# Patient Record
Sex: Female | Born: 1935 | Race: White | Hispanic: No | Marital: Married | Smoking: Never smoker
Health system: Southern US, Community
[De-identification: ages and names within clinical notes are randomized; demographics above are authoritative.]

## PROBLEM LIST (undated history)

## (undated) DIAGNOSIS — K224 Dyskinesia of esophagus: Secondary | ICD-10-CM

## (undated) DIAGNOSIS — E785 Hyperlipidemia, unspecified: Secondary | ICD-10-CM

## (undated) DIAGNOSIS — M545 Low back pain, unspecified: Secondary | ICD-10-CM

## (undated) DIAGNOSIS — I739 Peripheral vascular disease, unspecified: Secondary | ICD-10-CM

## (undated) DIAGNOSIS — A4902 Methicillin resistant Staphylococcus aureus infection, unspecified site: Secondary | ICD-10-CM

## (undated) DIAGNOSIS — N840 Polyp of corpus uteri: Secondary | ICD-10-CM

## (undated) DIAGNOSIS — M4800 Spinal stenosis, site unspecified: Secondary | ICD-10-CM

## (undated) DIAGNOSIS — R590 Localized enlarged lymph nodes: Secondary | ICD-10-CM

## (undated) DIAGNOSIS — R1310 Dysphagia, unspecified: Secondary | ICD-10-CM

## (undated) DIAGNOSIS — R0600 Dyspnea, unspecified: Secondary | ICD-10-CM

## (undated) DIAGNOSIS — I4891 Unspecified atrial fibrillation: Secondary | ICD-10-CM

## (undated) DIAGNOSIS — Z8719 Personal history of other diseases of the digestive system: Secondary | ICD-10-CM

## (undated) DIAGNOSIS — I10 Essential (primary) hypertension: Secondary | ICD-10-CM

## (undated) DIAGNOSIS — R351 Nocturia: Secondary | ICD-10-CM

## (undated) DIAGNOSIS — M51369 Other intervertebral disc degeneration, lumbar region without mention of lumbar back pain or lower extremity pain: Secondary | ICD-10-CM

## (undated) DIAGNOSIS — N83209 Unspecified ovarian cyst, unspecified side: Secondary | ICD-10-CM

## (undated) DIAGNOSIS — I639 Cerebral infarction, unspecified: Secondary | ICD-10-CM

## (undated) DIAGNOSIS — D649 Anemia, unspecified: Secondary | ICD-10-CM

## (undated) DIAGNOSIS — M1990 Unspecified osteoarthritis, unspecified site: Secondary | ICD-10-CM

## (undated) DIAGNOSIS — R2681 Unsteadiness on feet: Secondary | ICD-10-CM

## (undated) DIAGNOSIS — M810 Age-related osteoporosis without current pathological fracture: Secondary | ICD-10-CM

## (undated) DIAGNOSIS — L0390 Cellulitis, unspecified: Secondary | ICD-10-CM

## (undated) DIAGNOSIS — G629 Polyneuropathy, unspecified: Secondary | ICD-10-CM

## (undated) DIAGNOSIS — J90 Pleural effusion, not elsewhere classified: Secondary | ICD-10-CM

## (undated) DIAGNOSIS — E119 Type 2 diabetes mellitus without complications: Secondary | ICD-10-CM

## (undated) DIAGNOSIS — L97514 Non-pressure chronic ulcer of other part of right foot with necrosis of bone: Secondary | ICD-10-CM

## (undated) DIAGNOSIS — K219 Gastro-esophageal reflux disease without esophagitis: Secondary | ICD-10-CM

## (undated) DIAGNOSIS — T148XXA Other injury of unspecified body region, initial encounter: Secondary | ICD-10-CM

## (undated) DIAGNOSIS — S32050A Wedge compression fracture of fifth lumbar vertebra, initial encounter for closed fracture: Secondary | ICD-10-CM

## (undated) DIAGNOSIS — R32 Unspecified urinary incontinence: Secondary | ICD-10-CM

## (undated) DIAGNOSIS — R0609 Other forms of dyspnea: Secondary | ICD-10-CM

## (undated) DIAGNOSIS — I8390 Asymptomatic varicose veins of unspecified lower extremity: Secondary | ICD-10-CM

## (undated) DIAGNOSIS — M8610 Other acute osteomyelitis, unspecified site: Secondary | ICD-10-CM

## (undated) DIAGNOSIS — M5136 Other intervertebral disc degeneration, lumbar region: Secondary | ICD-10-CM

## (undated) DIAGNOSIS — M069 Rheumatoid arthritis, unspecified: Secondary | ICD-10-CM

## (undated) DIAGNOSIS — R17 Unspecified jaundice: Secondary | ICD-10-CM

## (undated) DIAGNOSIS — I499 Cardiac arrhythmia, unspecified: Secondary | ICD-10-CM

## (undated) DIAGNOSIS — G5603 Carpal tunnel syndrome, bilateral upper limbs: Secondary | ICD-10-CM

## (undated) HISTORY — PX: TONSILLECTOMY: SUR1361

## (undated) HISTORY — PX: COLONOSCOPY: SHX174

## (undated) HISTORY — PX: TOTAL KNEE ARTHROPLASTY: SHX125

## (undated) HISTORY — PX: EYE SURGERY: SHX253

## (undated) HISTORY — PX: APPENDECTOMY: SHX54

## (undated) HISTORY — PX: NECK MASS EXCISION: SHX2079

---
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
## 1993-11-18 HISTORY — PX: KNEE ARTHROSCOPY: SUR90

## 1998-09-28 ENCOUNTER — Other Ambulatory Visit: Admission: RE | Admit: 1998-09-28 | Discharge: 1998-09-28 | Payer: Self-pay | Admitting: Internal Medicine

## 1999-10-03 ENCOUNTER — Other Ambulatory Visit: Admission: RE | Admit: 1999-10-03 | Discharge: 1999-10-03 | Payer: Self-pay | Admitting: Internal Medicine

## 2000-10-06 ENCOUNTER — Other Ambulatory Visit: Admission: RE | Admit: 2000-10-06 | Discharge: 2000-10-06 | Payer: Self-pay | Admitting: Internal Medicine

## 2003-04-05 ENCOUNTER — Ambulatory Visit (HOSPITAL_COMMUNITY): Admission: RE | Admit: 2003-04-05 | Discharge: 2003-04-05 | Payer: Self-pay | Admitting: Gastroenterology

## 2003-04-05 ENCOUNTER — Encounter (INDEPENDENT_AMBULATORY_CARE_PROVIDER_SITE_OTHER): Payer: Self-pay | Admitting: *Deleted

## 2004-11-18 HISTORY — PX: TOTAL HIP ARTHROPLASTY: SHX124

## 2005-07-09 ENCOUNTER — Emergency Department (HOSPITAL_COMMUNITY): Admission: EM | Admit: 2005-07-09 | Discharge: 2005-07-09 | Payer: Self-pay | Admitting: Emergency Medicine

## 2005-07-19 ENCOUNTER — Ambulatory Visit (HOSPITAL_COMMUNITY): Admission: RE | Admit: 2005-07-19 | Discharge: 2005-07-19 | Payer: Self-pay | Admitting: Obstetrics and Gynecology

## 2005-08-08 ENCOUNTER — Inpatient Hospital Stay (HOSPITAL_COMMUNITY): Admission: EM | Admit: 2005-08-08 | Discharge: 2005-08-13 | Payer: Self-pay | Admitting: Specialist

## 2005-08-16 ENCOUNTER — Emergency Department (HOSPITAL_COMMUNITY): Admission: EM | Admit: 2005-08-16 | Discharge: 2005-08-16 | Payer: Self-pay | Admitting: *Deleted

## 2005-12-06 ENCOUNTER — Ambulatory Visit (HOSPITAL_COMMUNITY): Admission: RE | Admit: 2005-12-06 | Discharge: 2005-12-06 | Payer: Self-pay | Admitting: Obstetrics and Gynecology

## 2007-10-13 ENCOUNTER — Emergency Department (HOSPITAL_COMMUNITY): Admission: EM | Admit: 2007-10-13 | Discharge: 2007-10-13 | Payer: Self-pay | Admitting: Emergency Medicine

## 2007-11-19 HISTORY — PX: TOTAL KNEE ARTHROPLASTY: SHX125

## 2008-01-18 ENCOUNTER — Inpatient Hospital Stay (HOSPITAL_COMMUNITY): Admission: RE | Admit: 2008-01-18 | Discharge: 2008-01-21 | Payer: Self-pay | Admitting: Orthopedic Surgery

## 2009-01-24 ENCOUNTER — Inpatient Hospital Stay (HOSPITAL_COMMUNITY): Admission: RE | Admit: 2009-01-24 | Discharge: 2009-01-26 | Payer: Self-pay | Admitting: Orthopedic Surgery

## 2009-09-24 ENCOUNTER — Encounter: Admission: RE | Admit: 2009-09-24 | Discharge: 2009-09-24 | Payer: Self-pay | Admitting: Neurology

## 2009-10-31 ENCOUNTER — Encounter: Admission: RE | Admit: 2009-10-31 | Discharge: 2009-11-15 | Payer: Self-pay | Admitting: Neurology

## 2009-11-18 HISTORY — PX: INCISION AND DRAINAGE: SHX5863

## 2009-11-27 ENCOUNTER — Encounter: Admission: RE | Admit: 2009-11-27 | Discharge: 2009-12-29 | Payer: Self-pay | Admitting: Neurology

## 2010-02-20 ENCOUNTER — Inpatient Hospital Stay (HOSPITAL_COMMUNITY): Admission: RE | Admit: 2010-02-20 | Discharge: 2010-02-23 | Payer: Self-pay | Admitting: Orthopedic Surgery

## 2010-02-22 ENCOUNTER — Ambulatory Visit: Payer: Self-pay | Admitting: Infectious Disease

## 2010-02-22 DIAGNOSIS — T8489XA Other specified complication of internal orthopedic prosthetic devices, implants and grafts, initial encounter: Secondary | ICD-10-CM

## 2010-03-10 ENCOUNTER — Emergency Department (HOSPITAL_COMMUNITY)
Admission: EM | Admit: 2010-03-10 | Discharge: 2010-03-10 | Payer: Self-pay | Source: Home / Self Care | Admitting: Emergency Medicine

## 2010-03-13 ENCOUNTER — Telehealth: Payer: Self-pay | Admitting: Infectious Disease

## 2010-03-14 ENCOUNTER — Telehealth: Payer: Self-pay | Admitting: Infectious Disease

## 2010-03-14 DIAGNOSIS — M009 Pyogenic arthritis, unspecified: Secondary | ICD-10-CM | POA: Insufficient documentation

## 2010-03-20 ENCOUNTER — Encounter: Payer: Self-pay | Admitting: Infectious Disease

## 2010-03-20 ENCOUNTER — Encounter (INDEPENDENT_AMBULATORY_CARE_PROVIDER_SITE_OTHER): Payer: Self-pay | Admitting: *Deleted

## 2010-03-20 ENCOUNTER — Emergency Department (HOSPITAL_COMMUNITY): Admission: EM | Admit: 2010-03-20 | Discharge: 2010-03-20 | Payer: Self-pay | Admitting: Emergency Medicine

## 2010-03-20 ENCOUNTER — Telehealth: Payer: Self-pay | Admitting: Infectious Disease

## 2010-03-20 DIAGNOSIS — M1990 Unspecified osteoarthritis, unspecified site: Secondary | ICD-10-CM | POA: Insufficient documentation

## 2010-03-20 DIAGNOSIS — L089 Local infection of the skin and subcutaneous tissue, unspecified: Secondary | ICD-10-CM | POA: Insufficient documentation

## 2010-03-20 DIAGNOSIS — K219 Gastro-esophageal reflux disease without esophagitis: Secondary | ICD-10-CM | POA: Insufficient documentation

## 2010-03-26 ENCOUNTER — Ambulatory Visit: Payer: Self-pay | Admitting: Infectious Disease

## 2010-03-26 DIAGNOSIS — R197 Diarrhea, unspecified: Secondary | ICD-10-CM | POA: Insufficient documentation

## 2010-03-26 DIAGNOSIS — I4891 Unspecified atrial fibrillation: Secondary | ICD-10-CM | POA: Insufficient documentation

## 2010-03-26 DIAGNOSIS — I10 Essential (primary) hypertension: Secondary | ICD-10-CM

## 2010-03-26 DIAGNOSIS — L270 Generalized skin eruption due to drugs and medicaments taken internally: Secondary | ICD-10-CM | POA: Insufficient documentation

## 2010-03-26 LAB — CONVERTED CEMR LAB: CRP: 0.3 mg/dL (ref <0.6)

## 2010-03-28 ENCOUNTER — Telehealth: Payer: Self-pay

## 2010-04-19 ENCOUNTER — Telehealth: Payer: Self-pay | Admitting: Infectious Disease

## 2010-04-26 ENCOUNTER — Ambulatory Visit: Payer: Self-pay | Admitting: Infectious Disease

## 2010-05-04 ENCOUNTER — Encounter: Payer: Self-pay | Admitting: Infectious Disease

## 2010-06-06 ENCOUNTER — Ambulatory Visit: Payer: Self-pay | Admitting: Infectious Disease

## 2010-06-06 DIAGNOSIS — L97509 Non-pressure chronic ulcer of other part of unspecified foot with unspecified severity: Secondary | ICD-10-CM

## 2010-06-20 LAB — CONVERTED CEMR LAB
ALT: 13 units/L (ref 0–35)
Albumin: 4.1 g/dL (ref 3.5–5.2)
Alkaline Phosphatase: 69 units/L (ref 39–117)
CO2: 26 meq/L (ref 19–32)
CRP: 0.8 mg/dL — ABNORMAL HIGH (ref <0.6)
Glucose, Bld: 102 mg/dL — ABNORMAL HIGH (ref 70–99)
MCHC: 30.8 g/dL (ref 30.0–36.0)
MCV: 87.8 fL (ref 78.0–100.0)
Platelets: 304 10*3/uL (ref 150–400)
Potassium: 4.6 meq/L (ref 3.5–5.3)
RBC: 4.74 M/uL (ref 3.87–5.11)
Sed Rate: 21 mm/hr (ref 0–22)
Sodium: 141 meq/L (ref 135–145)
Total Protein: 7.2 g/dL (ref 6.0–8.3)

## 2010-07-10 ENCOUNTER — Emergency Department (HOSPITAL_COMMUNITY): Admission: EM | Admit: 2010-07-10 | Discharge: 2010-07-10 | Payer: Self-pay | Admitting: Emergency Medicine

## 2010-08-20 ENCOUNTER — Encounter: Admission: RE | Admit: 2010-08-20 | Discharge: 2010-09-19 | Payer: Self-pay | Admitting: Orthopedic Surgery

## 2010-09-04 ENCOUNTER — Ambulatory Visit: Payer: Self-pay | Admitting: Infectious Disease

## 2010-09-04 LAB — CONVERTED CEMR LAB
BUN: 18 mg/dL (ref 6–23)
Basophils Absolute: 0 10*3/uL (ref 0.0–0.1)
Basophils Relative: 0 % (ref 0–1)
CRP: 0.4 mg/dL (ref <0.6)
Chloride: 106 meq/L (ref 96–112)
Hemoglobin: 13.7 g/dL (ref 12.0–15.0)
Lymphocytes Relative: 15 % (ref 12–46)
MCHC: 32.9 g/dL (ref 30.0–36.0)
Monocytes Absolute: 0.5 10*3/uL (ref 0.1–1.0)
Neutro Abs: 6.7 10*3/uL (ref 1.7–7.7)
Neutrophils Relative %: 77 % (ref 43–77)
Platelets: 277 10*3/uL (ref 150–400)
Potassium: 4.7 meq/L (ref 3.5–5.3)
RDW: 15.9 % — ABNORMAL HIGH (ref 11.5–15.5)
Sed Rate: 1 mm/hr (ref 0–22)
Sodium: 143 meq/L (ref 135–145)

## 2010-09-13 ENCOUNTER — Ambulatory Visit (HOSPITAL_COMMUNITY): Admission: RE | Admit: 2010-09-13 | Discharge: 2010-09-13 | Payer: Self-pay | Admitting: Infectious Disease

## 2010-10-19 ENCOUNTER — Inpatient Hospital Stay (HOSPITAL_COMMUNITY)
Admission: RE | Admit: 2010-10-19 | Discharge: 2010-10-24 | Payer: Self-pay | Source: Home / Self Care | Attending: Orthopedic Surgery | Admitting: Orthopedic Surgery

## 2010-12-03 ENCOUNTER — Ambulatory Visit
Admission: RE | Admit: 2010-12-03 | Discharge: 2010-12-03 | Payer: Self-pay | Source: Home / Self Care | Attending: Infectious Disease | Admitting: Infectious Disease

## 2010-12-03 ENCOUNTER — Encounter: Payer: Self-pay | Admitting: Infectious Disease

## 2010-12-03 LAB — CONVERTED CEMR LAB
BUN: 20 mg/dL (ref 6–23)
Basophils Absolute: 0 10*3/uL (ref 0.0–0.1)
CO2: 28 meq/L (ref 19–32)
CRP: 0.4 mg/dL (ref <0.6)
Calcium: 9.3 mg/dL (ref 8.4–10.5)
Creatinine, Ser: 0.76 mg/dL (ref 0.40–1.20)
Eosinophils Absolute: 0.3 10*3/uL (ref 0.0–0.7)
Eosinophils Relative: 3 % (ref 0–5)
Glucose, Bld: 131 mg/dL — ABNORMAL HIGH (ref 70–99)
HCT: 39.2 % (ref 36.0–46.0)
Lymphocytes Relative: 13 % (ref 12–46)
Lymphs Abs: 1.1 10*3/uL (ref 0.7–4.0)
Neutrophils Relative %: 78 % — ABNORMAL HIGH (ref 43–77)
Platelets: 280 10*3/uL (ref 150–400)
RDW: 14.1 % (ref 11.5–15.5)
Sed Rate: 5 mm/hr (ref 0–22)
WBC: 8.4 10*3/uL (ref 4.0–10.5)

## 2010-12-18 LAB — PROTIME-INR
INR: 4.76 — ABNORMAL HIGH (ref 0.00–1.49)
Prothrombin Time: 44.5 seconds — ABNORMAL HIGH (ref 11.6–15.2)

## 2010-12-18 LAB — URINALYSIS, ROUTINE W REFLEX MICROSCOPIC
Bilirubin Urine: NEGATIVE
Nitrite: NEGATIVE
Specific Gravity, Urine: 1.018 (ref 1.005–1.030)
Urine Glucose, Fasting: NEGATIVE mg/dL
pH: 7 (ref 5.0–8.0)

## 2010-12-18 LAB — BASIC METABOLIC PANEL
Chloride: 102 mEq/L (ref 96–112)
GFR calc non Af Amer: >60 mL/min (ref >60)
Potassium: 4.3 mEq/L (ref 3.5–5.1)
Sodium: 143 mEq/L (ref 135–145)

## 2010-12-18 LAB — CBC
Hemoglobin: 13.7 g/dL (ref 12.0–15.0)
MCH: 28.8 pg (ref 26.0–34.0)
MCV: 88.2 fL (ref 78.0–100.0)
Platelets: 275 10*3/uL (ref 150–400)
RBC: 4.76 MIL/uL (ref 3.87–5.11)
WBC: 8.2 10*3/uL (ref 4.0–10.5)

## 2010-12-18 LAB — APTT: aPTT: 56 seconds — ABNORMAL HIGH (ref 24–37)

## 2010-12-18 LAB — DIFFERENTIAL
Eosinophils Absolute: 0.3 10*3/uL (ref 0.0–0.7)
Lymphocytes Relative: 14 % (ref 12–46)
Lymphs Abs: 1.1 10*3/uL (ref 0.7–4.0)
Monocytes Relative: 5 % (ref 3–12)
Neutro Abs: 6.3 10*3/uL (ref 1.7–7.7)
Neutrophils Relative %: 77 % (ref 43–77)

## 2010-12-18 NOTE — Miscellaneous (Signed)
Summary: Problems, Medications and Allergies updated  Clinical Lists Changes  Problems: Added new problem of OSTEOARTHRITIS (ICD-715.90) Added new problem of GERD (ICD-530.81) Added new problem of OTH COMPLICATIONS DUE INTERNAL JOINT PROSTHESIS (ICD-996.77) - infection, septic arthritis Added new problem of UNSPEC LOCAL INFECTION SKIN&SUBCUTANEOUS TISSUE (ICD-686.9) - right second toe Medications: Added new medication of D.O.S. 250 MG CAPS (DOCUSATE SODIUM) as directed per PCP Added new medication of LOVENOX 100 MG/ML SOLN (ENOXAPARIN SODIUM) as directed per PCP Added new medication of IRON 325 (65 FE) MG TABS (FERROUS SULFATE) as directed per PCP Added new medication of VICODIN 5-500 MG TABS (HYDROCODONE-ACETAMINOPHEN) as directed per PCP Added new medication of PROTONIX 40 MG TBEC (PANTOPRAZOLE SODIUM) as directed per PCP Added new medication of MIRALAX  POWD (POLYETHYLENE GLYCOL 3350) as directed per PCP Added new medication of TYLENOL 325 MG TABS (ACETAMINOPHEN) as directed per PCP Added new medication of CALCIUM 500 MG TABS (CALCIUM) as directed per PCP Added new medication of DIPHENHYDRAMINE HCL 25 MG CAPS (DIPHENHYDRAMINE HCL) as directed by PCP Added new medication of DILAUDID 2 MG TABS (HYDROMORPHONE HCL) as directed per PCP Added new medication of MAALOX REGULAR STRENGTH 225-200-25 MG/5ML SUSP (ALUM & MAG HYDROXIDE-SIMETH) as directed per PCP Added new medication of ROBAXIN 500 MG TABS (METHOCARBAMOL) as directed per PCP Added new medication of ZOFRAN 4 MG TABS (ONDANSETRON HCL) as directed per PCP Added new medication of AMBIEN 5 MG TABS (ZOLPIDEM TARTRATE) as directed per PCP Added new medication of VANCOMYCIN HCL IN DEXTROSE 1 GM/200ML SOLN (VANCOMYCIN HCL IN DEXTROSE) per pharmacy protocol Added new medication of ROCEPHIN 1 GM SOLR (CEFTRIAXONE SODIUM) per Dr. [name] Allergies: Added new allergy or adverse reaction of SULFA Observations: Added new observation of ALLERGY  REV: Done (03/20/2010 11:04) Added new observation of NKA: F (03/20/2010 11:04)

## 2010-12-18 NOTE — Progress Notes (Signed)
Summary: HH cert. ends 6/6, last dose abx 6/8, PICC pull @ ID 04/26/10  Phone Note Other Incoming   Caller: Tawanna Cooler from [facility] [phone] Details for Reason: end of certification period for medicare Summary of Call: Certification period ends 04/23/2010 for [facility] RN visits.  [name] states that they cannot obtain recertification for one visit to d/c PICC after last dose of abx on 04/25/2010.  Asking that the pt. come to the [facility] office to have her PICC pulled.  Appt. made for 04/26/2010 @ 1030 w/ RN to have PICC pulled. [name] RN  April 19, 2010 12:19 PM

## 2010-12-18 NOTE — Progress Notes (Signed)
Summary: Allergic rxn post rocephin and vanco infusion  Phone Note Outgoing Call   Call placed by: Acey Lav MD,  Mar 20, 2010 1:49 PM Details for Reason: Pt having allergic rxn to vancomycin vs rocephin. Summary of Call: pt had tingly feeling post rocephin, then one hr later given vancomycin and had diffuse rash with pruritic lesions per ed staff. I am changing her to cubicin alone. Initial call taken by: Acey Lav MD,  Mar 20, 2010 1:50 PM   New Allergies: ! VANCOMYCIN HCL IN DEXTROSE (VANCOMYCIN HCL IN DEXTROSE) ! ROCEPHIN (CEFTRIAXONE SODIUM) New/Updated Medications: CUBICIN 500 MG SOLR (DAPTOMYCIN) 6mg /kg iv daily New Allergies: ! VANCOMYCIN HCL IN DEXTROSE (VANCOMYCIN HCL IN DEXTROSE) ! ROCEPHIN (CEFTRIAXONE SODIUM)

## 2010-12-18 NOTE — Miscellaneous (Signed)
Summary: [name]: Orders  [facility]: Orders   Imported By: Florinda Marker 04/18/2010 14:39:44  _____________________________________________________________________  External Attachment:    Type:   Image     Comment:   External Document

## 2010-12-18 NOTE — Assessment & Plan Note (Signed)
Summary: 87month f/u [mkj]   Visit Type:  Follow-up Referring Provider:  [name] Primary Provider:  [name]  CC:  3 month follow up.  History of Present Illness: 77 year old  lady with left total knee replacement on  January 24, 2009.  She apparently had been doing relatively well but then  developed an area on her right second toe where she developed an  apparent infection with purulent material from it although she denied  being treated with antibiotic.  She was cared for by her podiatrist.  Since then she sustained a fall and landed on her knee where she had  swelling and pain.  The swelling worsened and she developed increasing  erythema along with malaise.  She was seen by [name] in his office on  April , 2011 and he aspirated the knee and the aspirate showed 41,000 white  blood cells with a predominance of neutrophils.  He sent it for cultures and aerobic and anaerobic cultures failed to grow any organisms. She was started on doxycycline and then was admitted on April  5 and underwent incision and drainage of the left total knee including  extensive synovectomy of the anterior suprapatellar, medial and lateral  gutters with polyethylene exchange.  Intraoperative cultures were sent failed to grow any organisms.  I saw her at [facility] and added rocephin to her vancomycin She was experiencing from paresthesias with the infusion of her rocephin and immediately after vancomycin. Shecurrently remains on doxycycline on which [name] had wished that she remiain for minimum of  6 months total. She has been without fevers, chills. She only has pain in knee with prolonged standingf rom PT alone.  Her 2nd toe has developed worsening pain which shoots up her ankle.   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 250 Mg Caps (Docusate Sodium) .... As Directed Per Pcp 2)  Iron 325 (65 Fe) Mg Tabs (Ferrous Sulfate) .... As Directed Per Pcp 3)  Calcium 500 Mg Tabs (Calcium) .... As Directed Per Pcp 4)  Maalox Regular Strength 225-200-25 Mg/86ml Susp (Alum & Mag Hydroxide-Simeth) .... As Directed Per Pcp 5)  Coumadin 1 Mg Tabs (Warfarin Sodium) .... As Directed 6)  Toprol Xl 25 Mg Xr24h-Tab (Metoprolol Succinate) .... Take One Tablet By Mouth One Time A Day 7)  Doxycycline Hyclate 100 Mg Caps (Doxycycline Hyclate) .... Take 1 Tablet By Mouth Two Times A Day Until Instructed By Dr. [name] 8)  Norvasc 5 Mg Tabs (Amlodipine Besylate) .... Take 1 Tablet By Mouth Once A Day  Current Medications (verified): 1)  D.o.s. 250 Mg Caps (Docusate Sodium) .... As Directed Per Pcp 2)  Iron 325 (65 Fe) Mg Tabs (Ferrous Sulfate) .... As Directed Per Pcp 3)  Calcium 500 Mg Tabs (Calcium) .... As Directed Per Pcp 4)  Maalox Regular Strength 225-200-25 Mg/62ml Susp (Alum & Mag Hydroxide-Simeth) .... As Directed Per Pcp 5)  Coumadin 1 Mg Tabs (Warfarin Sodium) .... As Directed 6)  Toprol Xl 25 Mg Xr24h-Tab (Metoprolol Succinate) .... Take One Tablet By Mouth One  Time A Day 7)  Doxycycline Hyclate 100 Mg Caps (Doxycycline Hyclate) .... Take 1 Tablet By Mouth Two Times A Day Until Instructed By Dr. [name] 8)  Norvasc 5 Mg Tabs (Amlodipine Besylate) .... Take 1 Tablet By Mouth Once A Day  Allergies: 1)  ! Sulfa 2)  ! Vancomycin Hcl in Dextrose (Vancomycin Hcl in Dextrose) 3)  ! Rocephin (Ceftriaxone Sodium)   Preventive Screening-Counseling & Management  Alcohol-Tobacco     Alcohol drinks/day: 0     Smoking Status: never  Caffeine-Diet-Exercise     Caffeine  use/day: 0     Does Patient Exercise: yes     Type of exercise: PT  Safety-Violence-Falls     Seat Belt Use: yes   Current Allergies (reviewed today): ! SULFA ! VANCOMYCIN HCL IN DEXTROSE (VANCOMYCIN HCL IN DEXTROSE) ! ROCEPHIN (CEFTRIAXONE SODIUM) Review of Systems  The patient denies anorexia, fever, weight loss, weight gain, vision loss, decreased hearing, hoarseness, chest pain, syncope, dyspnea on exertion, peripheral edema, prolonged cough, headaches, hemoptysis, abdominal pain, melena, hematochezia, severe indigestion/heartburn, hematuria, incontinence, genital sores, muscle weakness, suspicious skin lesions, transient blindness, difficulty walking, depression, unusual weight change, abnormal bleeding, and enlarged lymph nodes.    Vital Signs:  Patient profile:   77 year old female Height:      64 inches (162.56 cm) Weight:      114.8 pounds (52.18 kg) BMI:     19.78 Temp:     97.5 degrees F (36.39 degrees C) oral Pulse rate:   51 / minute BP sitting:   149 / 79  (left arm)  Vitals Entered By: Baxter Hire) (September 04, 2010 9:53 AM) CC: 3 month follow up Pain Assessment Patient in pain? yes     Location: right second toe Intensity: 4 Type: aching Onset of pain  pain comes and goes Nutritional Status BMI of 19 -24 = normal Nutritional Status Detail appetite is good per patient  Does patient need assistance? Functional Status Self care Ambulation Normal   Physical Exam  General:  alert, well-nourished, and well-hydrated.   Head:  normocephalic, atraumatic, and no abnormalities observed.   Eyes:  vision grossly intact, pupils equal, pupils round, and pupils reactive to light.   Ears:  no external deformities.   Nose:  no external deformity.   Mouth:  pharynx pink and moist, no erythema, and no exudates.   Neck:  supple and full ROM.   Lungs:  normal respiratory effort, no crackles, and no wheezes.   Heart:  normal rate, regular rhythm, no murmur, no  gallop, and no rub.   Abdomen:  soft, non-tender, and normal bowel sounds.   Msk:  her left knee site is well healed slightly warm, swollen not especially tender Extremities:  trace left pedal edema and trace right pedal edema.   Neurologic:  alert & oriented X3, strength normal in all extremities, and gait normal.   Skin:  slight ulceration under ventral aspect of 2nd 3rd digits. 2nd digit is tender to palpation and with slight erythema Psych:  Oriented X3, normally interactive, and good eye contact.     Impression & Recommendations:  Problem # 1:  ULCER OF OTHER PART OF FOOT (ICD-707.15) I will get an MRI of this foot to make sure there is no osteomyelitis brewing here. Plain films had been unrevealing in the past. CHeck esr and crp  Her updated medication list for this problem includes:    Doxycycline Hyclate 100 Mg Caps (Doxycycline hyclate) .Marland KitchenMarland KitchenMarland KitchenMarland Kitchen  Take 1 tablet by mouth two times a day until instructed by dr. Zenaida Niece dam  Orders: T-Basic Metabolic Panel [phone]) T-CBC w/Diff [id number]) T-C-Reactive Protein [phone]) T-Sed Rate (Automated) [phone]) MRI with Contrast (MRI w/Contrast) Est. Patient Level IV (75643)  Problem # 2:  ARTHRITIS, SEPTIC (ICD-711.00) Assessment: Unchanged check esr, crp, bmp, cbc today, continue doxycyline for now. She has had 6 months of therapy by now but would like to make sure that her opposite foot is not with infection Her updated medication list for this problem includes:    Doxycycline Hyclate 100 Mg Caps (Doxycycline hyclate) .Marland Kitchen... Take 1 tablet by mouth two times a day until instructed by dr. Zenaida Niece dam  Problem # 3:  DIARRHEA (ICD-787.91) not recurred Her updated medication list for this problem includes:    Doxycycline Hyclate 100 Mg Caps (Doxycycline hyclate) .Marland Kitchen... Take 1 tablet by mouth two times a day until instructed by dr. Zenaida Niece dam  Orders: Est. Patient Level IV (32951)  Patient Instructions: 1)  RTC TO SEE DR. VAN DAM IN ONE  WEEK ON WEDNESDAY AT 3PM

## 2010-12-18 NOTE — Progress Notes (Signed)
Summary: Care Plan Oversight  Phone Note Outgoing Call   Call placed by: Acey Lav MD,  March 14, 2010 11:40 PM Details for Reason: Care Plan Oversight Summary of Call: [id number](> 60 mins) I have supervised home care and/or infusion therapy for this pt, including providing orders for care, review of labs and/or home health care plans, communicating with the home health care professionals and/or patient/caregivers to integrate current information into the medical treatment plan and/or adjust the medical therapy. This supervision has been provided for70_minutes during the calendar month. Dates for this oversight  02/23/10 thru 03/24/10.    Initial call taken by: Acey Lav MD,  March 14, 2010 11:40 PM  New Problems: ARTHRITIS, SEPTIC (ICD-711.00)   New Problems: ARTHRITIS, SEPTIC (ICD-711.00)  Appended Document: Care Plan Oversight Patient being treated for septic arthritis

## 2010-12-18 NOTE — Assessment & Plan Note (Signed)
Summary: [facility] prosthetic jt inf/need chart/   Referring Provider:  [name] Primary Provider:  [name]  CC:  new patient hospital follow up and left knee infection.  History of Present Illness: 77 year old  lady with left total knee replacement on  January 24, 2009.  She apparently had been doing relatively well but then  developed an area on her right second toe where she developed an  apparent infection with purulent material from it although she denied  being treated with antibiotic.  She was cared for by her podiatrist.  Since then she sustained a fall and landed on her knee where she had  swelling and pain.  The swelling worsened and she developed increasing  erythema along with malaise.  She was seen by [name] in his office on  April 1 and he aspirated the knee and the aspirate showed 41,000 white  blood cells with a predominance of neutrophils.  He sent it for cultures and aerobic and anaerobic cultures failed to grow any organisms. The patient apparently was then, according to her account,   started on doxycycline.  She was then admitted to the hospital on April  5 and underwent incision and drainage of the left total knee including  extensive synovectomy of the anterior suprapatellar, medial and lateral  gutters with polyethylene exchange.  Intraoperative cultures were sent   and far failed to grow any organisms.  I saw her at [facility] and added rocephin to her vancomycin Since then she has suffered from a bout of fevers and chills (evaluated in ED), diarrhea (rx by me presumptively for  c difficile colitis though I now doubt this dx) and finally with an allergic rash that developed last week. She was experiencing from paresthesias with the infusion of her rocephin and again experienced this last week. She then took the vancomycin infusion and immediately thereafter developed an intense and diffuse pruritic maculo-papular rash that extended to her trunk arms and legs. She was  seen in the ED and given prednisone. I was called an stopped her vancomycin and rocephin and changed her to daptomycin. Her rash is improving. She continues to have minimal pain in her knee with weight bearing and is without further fevers or chills, malaise, diarrhea. She appears to have been diagnosed with atrial fibrillation in the interim as she now has with her rx for metoprolol and coumadin and speaks of an irregular heartbeat.  Current Medications (verified): 1)  D.o.s. 250 Mg Caps (Docusate Sodium) .... As Directed Per Pcp 2)  Iron 325 (65 Fe) Mg Tabs (Ferrous Sulfate) .... As Directed Per Pcp 3)  Calcium 500 Mg Tabs (Calcium) .... As Directed Per Pcp 4)  Diphenhydramine Hcl 25 Mg Caps (Diphenhydramine Hcl) .... As Directed By Pcp 5)  Maalox Regular Strength 225-200-25 Mg/64ml Susp (Alum & Mag Hydroxide-Simeth) .... As Directed Per Pcp 6)  Zofran 4 Mg Tabs (Ondansetron Hcl) .... As Directed Per Pcp 7)  Cubicin 500 Mg Solr (Daptomycin) .... 6mg /kg Iv Daily 8)  Coumadin 1 Mg Tabs (Warfarin Sodium) .... As Directed 9)  Toprol Xl 25 Mg Xr24h-Tab (Metoprolol Succinate) .... Take One Tablet By Mouth One Time A Day 10)  Prednisone 20 Mg Tabs (Prednisone) .... Take 2 Tablets Daily X 4 Days 11)  Doxycycline Hyclate 100 Mg Caps (Doxycycline Hyclate) .... Take 1 Tablet By Mouth Two Times A Day Until Instructed By Dr. [name]  Allergies (verified): 1)  ! Sulfa 2)  ! Vancomycin Hcl in Dextrose (  Vancomycin Hcl in Dextrose) 3)  ! Rocephin (Ceftriaxone Sodium)   Preventive Screening-Counseling & Management  Alcohol-Tobacco     Alcohol drinks/day: 0     Smoking Status: never  Caffeine-Diet-Exercise     Caffeine use/day: 0   Current Allergies (reviewed today): ! SULFA ! VANCOMYCIN HCL IN DEXTROSE (VANCOMYCIN HCL IN DEXTROSE) ! ROCEPHIN (CEFTRIAXONE SODIUM) Past History:  Family History: Last updated: 03/26/2010 Noncontributory.   Social History: Last updated: 03/26/2010 Married,  self-employed nonsmoker, rare alcohol.  Risk Factors: Alcohol Use: 0 (03/26/2010) Caffeine Use: 0 (03/26/2010)  Risk Factors: Smoking Status: never (03/26/2010)  Past Medical History:  1. Osteoarthritis.   2. Gastroesophageal reflux disease and hiatal hernia.   3. Second toe ulcer as described above.     Atrial Fibrillation?  Past Surgical History:   1. Left total knee replacement in March 2010.   2. Right total knee replacement in 2009.   3. Left total knee replacement in 2006.  4. I and D and exchange in April 2011     Family History: Reviewed history and no changes required. Noncontributory.   Social History: Reviewed history and no changes required. Married, self-employed nonsmoker, rare alcohol.  Review of Systems       The patient complains of suspicious skin lesions.  The patient denies anorexia, fever, weight loss, weight gain, hoarseness, chest pain, syncope, dyspnea on exertion, peripheral edema, prolonged cough, headaches, hemoptysis, abdominal pain, melena, hematochezia, severe indigestion/heartburn, hematuria, incontinence, genital sores, muscle weakness, transient blindness, difficulty walking, depression, unusual weight change, abnormal bleeding, and enlarged lymph nodes.    Vital Signs:  Patient profile:   77 year old female Height:      64 inches (162.56 cm) Weight:      119.8 pounds (54.45 kg) BMI:     20.64 Temp:     97.3 degrees F (36.28 degrees C) oral Pulse rate:   60 / minute BP sitting:   180 / 130  (left arm)  Vitals Entered By: Wendall Mola CMA Duncan Dull) (Mar 26, 2010 11:02 AM) CC: new patient hospital follow up, left knee infection Is Patient Diabetic? No Pain Assessment Patient in pain? no      Nutritional Status BMI of 19 -24 = normal Nutritional Status Detail appetite "improving"  Does patient need assistance? Functional Status Self care Ambulation Impaired:Risk for fall Comments pt. uses walker   Physical  Exam  General:  alert, well-nourished, and well-hydrated.   Head:  normocephalic, atraumatic, and no abnormalities observed.   Eyes:  vision grossly intact, pupils equal, pupils round, and pupils reactive to light.   Ears:  no external deformities.   Nose:  no external deformity.   Mouth:  pharynx pink and moist, no erythema, and no exudates.   Neck:  supple and full ROM.   Lungs:  normal respiratory effort, no crackles, and no wheezes.   Heart:  normal rate, regular rhythm, no murmur, no gallop, and no rub.   Abdomen:  soft, non-tender, and normal bowel sounds.   Msk:  her left knee site is well healed slightly warm Extremities:  trace left pedal edema and trace right pedal edema.   Neurologic:  alert & oriented X3, strength normal in all extremities, and gait normal.   Skin:  diffuse maculopapular blanching rash on her arms, legs, back, legs (which she says is improved) Psych:  Oriented X3, normally interactive, and good eye contact.     Impression & Recommendations:  Problem # 1:  ARTHRITIS, SEPTIC (  ICD-711.00) Assessment Comment Only Check ESR, CRP today, then change her to doxcycline as a suppressive antibiotic The following medications were removed from the medication list:    Vicodin 5-500 Mg Tabs (Hydrocodone-acetaminophen) .Marland Kitchen... As directed per pcp    Tylenol 325 Mg Tabs (Acetaminophen) .Marland Kitchen... As directed per pcp    Dilaudid 2 Mg Tabs (Hydromorphone hcl) .Marland Kitchen... As directed per pcp Her updated medication list for this problem includes:    Doxycycline Hyclate 100 Mg Caps (Doxycycline hyclate) .Marland Kitchen... Take 1 tablet by mouth two times a day until instructed by dr. Zenaida Niece dam  Orders: T-C-Reactive Protein [phone]) T-Sed Rate (Automated) [phone]) Est. Patient Level IV (64403)  Problem # 2:  CUTANEOUS ERUPTIONS, DRUG-INDUCED (ICD-693.0) Assessment: Improved  My guess would be this was due to the beta lactam but vanco could have caused this too. woud avoid beta lactams for  now.   Orders: Est. Patient Level IV (47425)  Problem # 3:  DIARRHEA (ICD-787.91)  I gave her flagyl for presumptive rx of Cdiff colitis when she called me over thepone and did not want to come in for evaluation. However her wbc have been completely normal and I doubt she had c difficile in that context but more likely abx induced diarrhea Her updated medication list for this problem includes:    Doxycycline Hyclate 100 Mg Caps (Doxycycline hyclate) .Marland Kitchen... Take 1 tablet by mouth two times a day until instructed by dr. Zenaida Niece dam  Orders: Est. Patient Level IV (95638)  Problem # 4:  ESSENTIAL HYPERTENSION (ICD-401.9) Assessment: New  I DID not notice her bp of 180 over 130 when she saw me inclinic. I will cal lher on the phone and see if I can make any adjustments to her medications. Part of this could be prednisoen induced but she shouldtnt be allowed to be this high chronically>She was asymptomatic. Her updated medication list for this problem includes:    Toprol Xl 25 Mg Xr24h-tab (Metoprolol succinate) .Marland Kitchen... Take one tablet by mouth one time a day  Orders: Est. Patient Level IV (75643)  Problem # 5:  ATRIAL FIBRILLATION (ICD-427.31)  On beta blocker and coumadin it appears. PCP managing. Her updated medication list for this problem includes:    Coumadin 1 Mg Tabs (Warfarin sodium) .Marland Kitchen... As directed    Toprol Xl 25 Mg Xr24h-tab (Metoprolol succinate) .Marland Kitchen... Take one tablet by mouth one time a day  Orders: Est. Patient Level IV (32951)  Medications Added to Medication List This Visit: 1)  Coumadin 1 Mg Tabs (Warfarin sodium) .... As directed 2)  Toprol Xl 25 Mg Xr24h-tab (Metoprolol succinate) .... Take one tablet by mouth one time a day 3)  Prednisone 20 Mg Tabs (Prednisone) .... Take 2 tablets daily x 4 days 4)  Doxycycline Hyclate 100 Mg Caps (Doxycycline hyclate) .... Take 1 tablet by mouth two times a day until instructed by dr. Zenaida Niece dam  Patient Instructions: 1)  after you  finish your iv daptomycin your picc ine can be pulled and you should then start the twice daily doxycyline 2)  rtc to see Dr. [name] in 2 months time Prescriptions: DOXYCYCLINE HYCLATE 100 MG CAPS (DOXYCYCLINE HYCLATE) Take 1 tablet by mouth two times a day until instructed by Dr. [name]  #60 x 5   Entered and Authorized by:   Acey Lav MD   Signed by:   Paulette Blanch Dam MD on 03/26/2010   Method used:   Electronically to  [facility] [id number] (retail)       [address] [place], [place]  [address]       Ph: [phone]       Fax: [phone]   RxID:   [id number]   Appended Document: Orders Update Will call in 2nd bp drug for her.   Clinical Lists Changes  Medications: Added new medication of NORVASC 5 MG TABS (AMLODIPINE BESYLATE) Take 1 tablet by mouth once a day - Signed Rx of NORVASC 5 MG TABS (AMLODIPINE BESYLATE) Take 1 tablet by mouth once a day;  #31 x 2;  Signed;  Entered by: Acey Lav MD;  Authorized by: Paulette Blanch Dam MD;  Method used: Electronically to [facility] [id number], [place], [place], [place]  [address], Ph: [phone], Fax: [phone]    Prescriptions: NORVASC 5 MG TABS (AMLODIPINE BESYLATE) Take 1 tablet by mouth once a day  #31 x 2   Entered and Authorized by:   Acey Lav MD   Signed by:   Paulette Blanch Dam MD on 03/26/2010   Method used:   Electronically to        [facility] [id number] (retail)       [address] [place], [place]  [address]       Ph: [phone]       Fax: [phone]   RxID:   [id number]

## 2010-12-18 NOTE — Progress Notes (Signed)
Summary: diarrhea and chills  Phone Note Call from Patient   Caller: [phone] Summary of Call: voicemail:  pt called c/o diarrhea and chills. She thinks this is related to the IV Atbx. She has not been seen in the office.  HFU sched. for May. message sent to Dr Algis Liming via pager. Tomasita Morrow RN  March 13, 2010 5:04 PM   Follow-up for Phone Call        I called her in flagyl to treat for c diff in case that is what is going on. Spoke wiith after clinic hrs on Tuesday Follow-up by: Acey Lav MD,  March 14, 2010 11:40 PM

## 2010-12-18 NOTE — Miscellaneous (Signed)
Summary: [name]:  [facility]:   Imported By: Florinda Marker 05/04/2010 15:32:32  _____________________________________________________________________  External Attachment:    Type:   Image     Comment:   External Document

## 2010-12-18 NOTE — Progress Notes (Signed)
Summary: [name] calling   Phone Note Other Incoming   [facility]  [phone] Summary of Call: Nurse is unsure what to do for patient. He states she needs to have a family member present with her during the visit and has expressed this to the family.   She was here this week and saw Dr [name] but was not able to verbalize what he said.  He knows she was given a script for blood pressure medications.  They are unsure when the stop date is for the IV antibiotics. She is not taking the coumadin and ,when is she to start the oral doxycycline .  Follow-up for Phone Call        Per Dr Algis Liming pt is to take the blood pressure medication and follow up with Dr Jarold Motto. She can d/c the IV atbx on April 25, 2010 and start the doxycycline after the PICC is removed.   Verbal orders/read back Tomasita Morrow RN  Mar 28, 2010 10:35 AM

## 2010-12-18 NOTE — Assessment & Plan Note (Signed)
Summary: 36month f/u/vs   Visit Type:  Follow-up Referring Provider:  [name] Primary Provider:  [name]  CC:  f/u toe pain.  History of Present Illness: 77 year old  lady with left total knee replacement on  January 24, 2009.  She apparently had been doing relatively well but then  developed an area on her right second toe where she developed an  apparent infection with purulent material from it although she denied  being treated with antibiotic.  She was cared for by her podiatrist.  Since then she sustained a fall and landed on her knee where she had  swelling and pain.  The swelling worsened and she developed increasing  erythema along with malaise.  She was seen by [name] in his office on  April 1 and he aspirated the knee and the aspirate showed 41,000 white  blood cells with a predominance of neutrophils.  He sent it for cultures and aerobic and anaerobic cultures failed to grow any organisms. She was started on doxycycline and then was admitted  started on doxycycline.  She was then admitted to the hospital on April  5 and underwent incision and drainage of the left total knee including  extensive synovectomy of the anterior suprapatellar, medial and lateral  gutters with polyethylene exchange.  Intraoperative cultures were sent  failed to grow any organisms.  I saw her at [facility] and added rocephin to her vancomycin She was experiencing from paresthesias with the infusion of her rocephin and immediately after vancomycin. She is currently on doxycycline and feels relatively well. She has been without fevers, chills. She only has pain in knee with prolonged standing. Her 2nd toe continues to hurt and she was seen by Dr. Lestine Box who performed plain films of this foot. [name] and Dr. 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 250 Mg Caps (Docusate Sodium) .... As Directed Per Pcp 2)  Iron 325 (65 Fe) Mg Tabs (Ferrous Sulfate) .... As Directed Per Pcp 3)  Calcium 500 Mg Tabs (Calcium) .... As Directed Per Pcp 4)  Diphenhydramine Hcl 25 Mg Caps (Diphenhydramine Hcl) .... As Directed By Pcp 5)  Maalox Regular Strength 225-200-25 Mg/80ml Susp (Alum & Mag Hydroxide-Simeth) .... As Directed Per Pcp 6)  Zofran 4 Mg Tabs (Ondansetron Hcl) .... As Directed Per Pcp 7)  Cubicin 500 Mg Solr (Daptomycin) .... 6mg /kg Iv Daily 8)  Coumadin 1 Mg Tabs (Warfarin Sodium) .... As Directed 9)  Toprol Xl 25 Mg Xr24h-Tab (Metoprolol Succinate) .... Take One Tablet By Mouth One Time A Day 10)  Prednisone 20 Mg Tabs (Prednisone) .... Take 2 Tablets Daily X 4 Days 11)  Doxycycline Hyclate 100 Mg Caps (Doxycycline Hyclate) .... Take 1 Tablet By Mouth Two Times A Day Until Instructed By Dr. [name] 12)  Norvasc 5 Mg Tabs (Amlodipine Besylate) .... Take 1 Tablet By Mouth Once A Day  Current Medications (verified): 1)  D.o.s. 250 Mg Caps (Docusate Sodium) .... As Directed Per Pcp 2)  Iron 325 (65 Fe) Mg Tabs (Ferrous Sulfate) .... As Directed Per Pcp 3)  Calcium 500 Mg Tabs (  Calcium) .... As Directed Per Pcp 4)  Maalox Regular Strength 225-200-25 Mg/28ml Susp (Alum & Mag Hydroxide-Simeth) .... As Directed Per Pcp 5)  Coumadin 1 Mg Tabs (Warfarin Sodium) .... As Directed 6)  Toprol Xl 25 Mg Xr24h-Tab (Metoprolol Succinate) .... Take One Tablet By Mouth One Time A Day 7)  Doxycycline Hyclate 100 Mg Caps (Doxycycline Hyclate) .... Take 1 Tablet By Mouth Two Times A Day Until Instructed By Dr. [name] 8)  Norvasc 5 Mg Tabs (Amlodipine Besylate) .... Take 1 Tablet By Mouth Once A  Day  Allergies: 1)  ! Sulfa 2)  ! Vancomycin Hcl in Dextrose (Vancomycin Hcl in Dextrose) 3)  ! Rocephin (Ceftriaxone Sodium)    Current Allergies (reviewed today): ! SULFA ! VANCOMYCIN HCL IN DEXTROSE (VANCOMYCIN HCL IN DEXTROSE) ! ROCEPHIN (CEFTRIAXONE SODIUM) Family History: Reviewed history from 03/26/2010 and no changes required. Noncontributory.   Social History: Reviewed history from 03/26/2010 and no changes required. Married, self-employed nonsmoker, rare alcohol.  Review of Systems  The patient denies anorexia, fever, weight loss, weight gain, vision loss, decreased hearing, hoarseness, chest pain, syncope, dyspnea on exertion, peripheral edema, prolonged cough, headaches, hemoptysis, abdominal pain, melena, hematochezia, severe indigestion/heartburn, hematuria, incontinence, genital sores, muscle weakness, suspicious skin lesions, transient blindness, difficulty walking, depression, unusual weight change, abnormal bleeding, enlarged lymph nodes, and angioedema.    Vital Signs:  Patient profile:   77 year old female Height:      64 inches (162.56 cm) Weight:      111.0 pounds (50.45 kg) BMI:     19.12 Temp:     97.4 degrees F (36.33 degrees C) oral Pulse rate:   53 / minute BP sitting:   145 / 69  (left arm)  Vitals Entered By: Starleen Arms CMA (June 06, 2010 10:51 AM) CC: f/u toe pain Pain Assessment Patient in pain? yes     Location: toe Intensity: 4 Type: aching  Does patient need assistance? Functional Status Self care Ambulation Normal   Physical Exam  General:  alert, well-nourished, and well-hydrated.   Head:  normocephalic, atraumatic, and no abnormalities observed.   Eyes:  vision grossly intact, pupils equal, pupils round, and pupils reactive to light.   Mouth:  pharynx pink and moist, no erythema, and no exudates.   Neck:  supple and full ROM.   Lungs:  normal respiratory effort, no crackles, and no wheezes.   Heart:  normal rate,  regular rhythm, no murmur, no gallop, and no rub.   Abdomen:  soft, non-tender, and normal bowel sounds.   Msk:  her left knee site is well healed slightly warm, swollen Neurologic:  alert & oriented X3, strength normal in all extremities, and gait normal.   Skin:  no rash Psych:  Oriented X3, normally interactive, and good eye contact.     Impression & Recommendations:  Problem # 1:  ULCER OF OTHER PART OF FOOT (ICD-707.15)  I will give [name] a call with re plain films and check esr, crp. Films in April did not show osteo Her updated medication list for this problem includes:    Doxycycline Hyclate 100 Mg Caps (Doxycycline hyclate) .Marland Kitchen... Take 1 tablet by mouth two times a day until instructed by dr. Zenaida Niece dam  Orders: Est. Patient Level IV ([address]109)  Problem # 2:  ARTHRITIS, SEPTIC (ICD-711.00) continue doxycyline for now, recheck esr, crp. if toe not showing evidence of osteo based on films from Dr Boneta Lucks office would be OK to take off  of doxcycline and observe. Her updated medication list for this problem includes:    Doxycycline Hyclate 100 Mg Caps (Doxycycline hyclate) .Marland Kitchen... Take 1 tablet by mouth two times a day until instructed by dr. Zenaida Niece dam  Orders: Diagnostic X-Ray/Fluoroscopy (Diagnostic X-Ray/Flu) T-Comprehensive Metabolic Panel [phone]) T-CBC No Diff ([id number]) T-C-Reactive Protein ([id number]) T- Sed rate non-auto (25366) Est. Patient Level IV (44034)  Problem # 3:  UNSPEC LOCAL INFECTION SKIN&SUBCUTANEOUS TISSUE (ICD-686.9) resolved. TIming would suggest vanco as culpri, but B lactam could have also been culprit. WOuld avoid vanco in future.  Patient Instructions: 1)  rtc 3 months 2)  I will review your films labs, and talk to [name] and then call you back re whether I want you to continue the doxycyline or stop it   Appended Document: 51month f/u/vs spoke with [name], He wants pt to stay on the doxycyline to complete 6 months of therapy in total to  avoid recurrence of infection at the knee. I think this is reaonable and will continue the doxycyline.

## 2010-12-18 NOTE — Miscellaneous (Signed)
Summary: HIPAA Restrictions  HIPAA Restrictions   Imported By: Florinda Marker 03/26/2010 14:31:18  _____________________________________________________________________  External Attachment:    Type:   Image     Comment:   External Document

## 2010-12-18 NOTE — Assessment & Plan Note (Signed)
Summary: Pull PICC line    Vitals Entered By: [name] RN (April 26, 2010 12:33 PM) Prior Medications: D.O.S. 250 MG CAPS (DOCUSATE SODIUM) as directed per PCP IRON 325 (65 FE) MG TABS (FERROUS SULFATE) as directed per PCP CALCIUM 500 MG TABS (CALCIUM) as directed per PCP DIPHENHYDRAMINE HCL 25 MG CAPS (DIPHENHYDRAMINE HCL) as directed by PCP MAALOX REGULAR STRENGTH 225-200-25 MG/5ML SUSP (ALUM & MAG HYDROXIDE-SIMETH) as directed per PCP ZOFRAN 4 MG TABS (ONDANSETRON HCL) as directed per PCP CUBICIN 500 MG SOLR (DAPTOMYCIN) 6mg /kg iv daily COUMADIN 1 MG TABS (WARFARIN SODIUM) as directed TOPROL XL 25 MG XR24H-TAB (METOPROLOL SUCCINATE) take one tablet by mouth one time a day PREDNISONE 20 MG TABS (PREDNISONE) take 2 tablets daily x 4 days DOXYCYCLINE HYCLATE 100 MG CAPS (DOXYCYCLINE HYCLATE) Take 1 tablet by mouth two times a day until instructed by Dr. [name] NORVASC 5 MG TABS (AMLODIPINE BESYLATE) Take 1 tablet by mouth once a day Current Allergies: ! SULFA ! VANCOMYCIN HCL IN DEXTROSE (VANCOMYCIN HCL IN DEXTROSE) ! ROCEPHIN (CEFTRIAXONE SODIUM) Orders Added: 1)  Est. Patient Level I [64332] 2)  Vaseline Gauze Dressing [A6222]  Order to remove PICC line obtained from Dr. [name].  Pt. identified with name and date of birth.  PICC dressing removed, site unremarkable.  PICC line removed using sterile procedure @ 1045 am.   PICC length equal to that noted in pt's hospital chart of 33 cm.  Sterile petroleum gauze + sterile 4X4 applied to PICC site, pressure applied for 10 minutes and covered with Medipore tape as a pressure dressing.  Pt. instructed to limit use of arm for 1 hour.  Pt. instructed that the pressure dressing should remain in place for 24 hours.  Pt. verablized understanding of these instructions.  Pt. tolerated procedure without complaint. [name] RN  April 26, 2010 12:46 PM   Appended Document: Pull PICC line  Angelique Blonder I think this is wrong pt. Mrs Hartgrove gets  her IV abx via a portacath. you DID remove PICC line from Mr. Kathreen Cosier thought. Thanks so much!  Appended Document: Pull PICC line  THis is to document that PICC line was removed from THIS pt as well. I saw a different Mrs Gebel (whom Sampson Goon had been seeing) who did not have PICC line

## 2010-12-20 NOTE — Assessment & Plan Note (Signed)
Summary: [name]   Visit Type:  Follow-up Referring Provider:  [name] Primary Provider:  [name]  CC:  hsfu left leg pain.  History of Present Illness: 77 year old  lady with left total knee replacement on  January 24, 2009.  She apparently had been doing relatively well but then  developed an area on her right second toe where she developed an  apparent infection with purulent material from it although she denied  being treated with antibiotic.  She was cared for by her podiatrist.  Since then she sustained a fall and landed on her knee where she had  swelling and pain.  The swelling worsened and she developed increasing  erythema along with malaise.  She was seen by [name] in his office on  April , 2011 and he aspirated the knee and the aspirate showed 41,000 white  blood cells with a predominance of neutrophils.  He sent it for cultures and aerobic and anaerobic cultures failed to grow any organisms. She was started on doxycycline and then was admitted on April  5 and underwent incision and drainage of the left total knee including  extensive synovectomy of the anterior suprapatellar, medial and lateral  gutters with polyethylene exchange.  Intraoperative cultures were sent failed to grow any organisms.  I saw her at [facility] and added rocephin to her vancomycin She was experiencing from paresthesias with the infusion of her rocephin and immediately after vancomycin. She was changed to doxycyline orally but then had recurrence of infection and underwent repeat I and D with removal of prosthetic material by [name] on December 2nd 2011 and she was placed ultimately on daptomicin and oral ciprofloxacin which have been continued thru today. She also has been restarted on the doxycyline in the SNF where she resides. She feels that pain is improved but still at times 4-5/10. She still has warmth at this site. We had done MRI to look at her right foot and there had been no evidence of osteomyelitis.  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 250 Mg Caps (Docusate Sodium) .... As Directed Per Pcp 2)  Iron 325 (65 Fe) Mg Tabs (Ferrous Sulfate) .... As Directed Per Pcp 3)  Calcium 500 Mg Tabs (Calcium) .... As Directed Per Pcp 4)  Maalox Regular Strength 225-200-25 Mg/60ml Susp (Alum & Mag Hydroxide-Simeth) .... As Directed Per Pcp 5)  Coumadin 1 Mg Tabs (Warfarin Sodium) .... As Directed 6)  Toprol Xl 25 Mg Xr24h-Tab (Metoprolol Succinate) .... Take One Tablet By Mouth One Time A Day 7)  Doxycycline Hyclate 100 Mg Caps (Doxycycline Hyclate) .... Take 1 Tablet By Mouth Two Times A Day Until Instructed By Dr. [name] 8)  Norvasc 5 Mg Tabs (Amlodipine Besylate) .... Take 1 Tablet By Mouth Once A Day  Current Medications (verified): 1)  D.o.s. 250 Mg Caps (Docusate Sodium) .... As Directed Per Pcp 2)  Iron 325 (65 Fe) Mg Tabs (Ferrous Sulfate) .... As Directed Per Pcp 3)  Calcium 500 Mg Tabs (  Calcium) .... As Directed Per Pcp 4)  Maalox Regular Strength 225-200-25 Mg/80ml Susp (Alum & Mag Hydroxide-Simeth) .... As Directed Per Pcp 5)  Coumadin 1 Mg Tabs (Warfarin Sodium) .... As Directed 6)  Toprol Xl 25 Mg Xr24h-Tab (Metoprolol Succinate) .... Take One Tablet By Mouth One Time A Day 7)  Doxycycline Hyclate 100 Mg Caps (Doxycycline Hyclate) .... Take 1 Tablet By Mouth Two Times A Day Until Instructed By Dr. [name] 8)  Norvasc 5 Mg Tabs (Amlodipine Besylate) .... Take 1 Tablet By Mouth Once A  Day 9)  Cubicin 500 Mg Solr (Daptomycin) 10)  Cipro 750 Mg Tabs (Ciprofloxacin Hcl)  Allergies: 1)  ! Sulfa 2)  ! Vancomycin Hcl in Dextrose (Vancomycin Hcl in Dextrose) 3)  ! Rocephin (Ceftriaxone Sodium)    Current Allergies (reviewed today): ! SULFA ! VANCOMYCIN HCL IN DEXTROSE (VANCOMYCIN HCL IN DEXTROSE) ! ROCEPHIN (CEFTRIAXONE SODIUM) Past History:  Past Medical History: Last updated: 03/26/2010  1. Osteoarthritis.   2. Gastroesophageal reflux disease and hiatal hernia.   3. Second toe ulcer as described above.     Atrial Fibrillation?  Past Surgical History: Last updated: 03/26/2010   1. Left total knee replacement in March 2010.   2. Right total knee replacement in 2009.   3. Left total knee replacement in 2006.  4. I and D and exchange in April 2011     Family History: Last updated: 03/26/2010 Noncontributory.   Social History: Last updated: 03/26/2010 Married, self-employed nonsmoker, rare alcohol.  Risk Factors: Alcohol Use: 0 (09/04/2010) Caffeine Use: 0 (09/04/2010) Exercise: yes (09/04/2010)  Risk Factors: Smoking Status: never (09/04/2010)   Review of Systems       see HPI otherwise negative on 12 pt review  Vital Signs:  Patient profile:   77 year old female Height:      64 inches (162.56 cm) Weight:      114 pounds (51.82 kg) BMI:     19.64 Temp:     97.5 degrees F (36.39 degrees C) oral BP sitting:   162 / 72  (left arm)  Vitals Entered By: Starleen Arms CMA (December 03, 2010 10:13 AM) CC: hsfu left leg pain Is Patient Diabetic? No Pain Assessment Patient in pain? yes     Location: left leg Intensity: 5 Type: aching   Physical Exam  General:  alert, well-nourished, and well-hydrated.   Head:  normocephalic, atraumatic, and no abnormalities observed.   Eyes:  vision grossly intact, pupils equal, pupils round, and pupils reactive to light.   Ears:  no external deformities.   Nose:  no external deformity.   Mouth:   pharynx pink and moist, no erythema, and no exudates.   Neck:  supple and full ROM.   Lungs:  normal respiratory effort, no crackles, and no wheezes.   Heart:  normal rate, regular rhythm, no murmur, no gallop, and no rub.   Abdomen:  soft, non-tender, and normal bowel sounds.   Msk:  left knee with steristrips across incision site. Knee is warm and there is minimal tenderness along the site Neurologic:  alert & oriented X3, strength normal in all extremities, and gait normal.   Skin:  improvement in ulcer on right toe.   Psych:  Oriented X3, normally interactive, and good eye contact.     Impression & Recommendations:  Problem # 1:  ARTHRITIS, SEPTIC (ICD-711.00) Assessment Comment Only Prosthetic knee infection. I will check esr, crp cbc and bmp today. I will pull PICC liWill  stop ALL of her antibiotics and observe OFF of antibiotics. We can then better re-evaluate recurrence of infeciton and [name] if he wished could aspirate the knee OFF antibiotics to see if there is any evidence for recurrence. I will also call him today re this pt Her updated medication list for this problem includes:    Doxycycline Hyclate 100 Mg Caps (Doxycycline hyclate) .Marland Kitchen... Take 1 tablet by mouth two times a day until instructed by dr. Zenaida Niece dam    Cipro 750 Mg Tabs (Ciprofloxacin hcl)  Orders: T-Basic Metabolic Panel [id number]) T-C-Reactive Protein [id number]) T-CBC w/Diff [phone]) T-Sed Rate (Automated) [phone]) Est. Patient Level IV (28413)  Problem # 2:  ESSENTIAL HYPERTENSION (ICD-401.9)  BP up defer to PCP Her updated medication list for this problem includes:    Toprol Xl 25 Mg Xr24h-tab (Metoprolol succinate) .Marland Kitchen... Take one tablet by mouth one time a day    Norvasc 5 Mg Tabs (Amlodipine besylate) .Marland Kitchen... Take 1 tablet by mouth once a day  BP today: 162/72 Prior BP: 149/79 (09/04/2010)  Labs Reviewed: K+: 4.7 (09/04/2010) Creat: : 0.87 (09/04/2010)     Orders: Est. Patient  Level IV (24401)  Problem # 3:  ULCER OF OTHER PART OF FOOT (ICD-707.15)  no osteo by mri Her updated medication list for this problem includes:    Doxycycline Hyclate 100 Mg Caps (Doxycycline hyclate) .Marland Kitchen... Take 1 tablet by mouth two times a day until instructed by dr. Zenaida Niece dam    Cipro 750 Mg Tabs (Ciprofloxacin hcl)  Orders: Est. Patient Level IV (02725)  Medications Added to Medication List This Visit: 1)  Cubicin 500 Mg Solr (Daptomycin) 2)  Cipro 750 Mg Tabs (Ciprofloxacin hcl)  Patient Instructions: 1)  If your blood work is reasurring we will take you off of the daptomycin and ciprofloxacin  2)  I will also talk to [name] about you as well 3)  we will schedule fu apt with dr Zenaida Niece dam after you have had your surgery  Prevention & Chronic Care Immunizations   Influenza vaccine: Not documented    Tetanus booster: Not documented    Pneumococcal vaccine: Not documented    H. zoster vaccine: Not documented  Colorectal Screening   Hemoccult: Not documented    Colonoscopy: Not documented  Other Screening   Pap smear: Not documented    Mammogram: Not documented    DXA bone density scan: Not documented   Smoking status: never  (09/04/2010)  Lipids   Total Cholesterol: Not documented   LDL: Not documented   LDL Direct: Not documented   HDL: Not documented   Triglycerides: Not documented  Hypertension   Last Blood Pressure: 162 / 72  (12/03/2010)   Serum creatinine: 0.87  (09/04/2010)   Serum potassium 4.7  (09/04/2010)  Self-Management Support :    Hypertension self-management support: Not documented  Appended Document: [name] Order to remove PICC line obtained from Dr. [name].  Pt. identified with name and date of birth.  PICC dressing removed, site unremarkable.  PICC line removed using sterile procedure @ 1100 AM.   PICC length equal to that noted in pt's hospital chart of 43 cm.  Sterile petroleum gauze + sterile 4X4 applied to PICC site, pressure  applied for 10 minutes and covered with Medipore tape as a pressure dressing.  Pt. instructed to limit use of arm for 1 hour.  Pt. instructed that the pressure dressing should remain in place for 24 hours.  Pt. verablized understanding of these  instructions.

## 2010-12-25 ENCOUNTER — Inpatient Hospital Stay (HOSPITAL_COMMUNITY)
Admission: RE | Admit: 2010-12-25 | Discharge: 2010-12-28 | DRG: 470 | Disposition: A | Discharge status: Skilled Nursing Facility | Payer: Medicare Other | Attending: Orthopedic Surgery | Admitting: Orthopedic Surgery

## 2010-12-25 DIAGNOSIS — M21869 Other specified acquired deformities of unspecified lower leg: Principal | ICD-10-CM | POA: Diagnosis present

## 2010-12-25 DIAGNOSIS — Z96659 Presence of unspecified artificial knee joint: Secondary | ICD-10-CM

## 2010-12-25 DIAGNOSIS — M1990 Unspecified osteoarthritis, unspecified site: Secondary | ICD-10-CM | POA: Diagnosis present

## 2010-12-25 LAB — TYPE AND SCREEN: Antibody Screen: NEGATIVE

## 2010-12-25 LAB — APTT: aPTT: 30 seconds (ref 24–37)

## 2010-12-26 LAB — CBC
HCT: 28.5 % — ABNORMAL LOW (ref 36.0–46.0)
Hemoglobin: 9.1 g/dL — ABNORMAL LOW (ref 12.0–15.0)
MCV: 88 fL (ref 78.0–100.0)
WBC: 6.4 10*3/uL (ref 4.0–10.5)

## 2010-12-26 LAB — BASIC METABOLIC PANEL
CO2: 26 mEq/L (ref 19–32)
Chloride: 109 mEq/L (ref 96–112)
GFR calc Af Amer: >60 mL/min (ref >60)
Potassium: 4.5 mEq/L (ref 3.5–5.1)

## 2010-12-26 LAB — PROTIME-INR: INR: 1.32 (ref 0.00–1.49)

## 2010-12-27 LAB — CBC
HCT: 30.7 % — ABNORMAL LOW (ref 36.0–46.0)
Hemoglobin: 10 g/dL — ABNORMAL LOW (ref 12.0–15.0)
MCH: 28.9 pg (ref 26.0–34.0)
MCV: 88.7 fL (ref 78.0–100.0)
RBC: 3.46 MIL/uL — ABNORMAL LOW (ref 3.87–5.11)
WBC: 6.9 10*3/uL (ref 4.0–10.5)

## 2010-12-27 LAB — BASIC METABOLIC PANEL
CO2: 24 mEq/L (ref 19–32)
Calcium: 8.3 mg/dL — ABNORMAL LOW (ref 8.4–10.5)
Chloride: 109 mEq/L (ref 96–112)
GFR calc Af Amer: >60 mL/min (ref >60)
Potassium: 4.6 mEq/L (ref 3.5–5.1)
Sodium: 139 mEq/L (ref 135–145)

## 2010-12-28 LAB — PROTIME-INR: INR: 1.27 (ref 0.00–1.49)

## 2010-12-30 NOTE — H&P (Signed)
[name], [name]                 ACCOUNT NO.:  [id number]  MEDICAL RECORD NO.:  [id number]           PATIENT TYPE:  LOCATION:                                 FACILITY:  PHYSICIAN:  Madlyn Frankel. Charlann Boxer, M.D.  DATE OF BIRTH:  1928/04/24  DATE OF ADMISSION:  12/06/2010 DATE OF DISCHARGE:                             HISTORY & PHYSICAL   ADMITTING DIAGNOSES:  Reimplantation of hardware for left total knee after a resection of hardware and implantation of antibiotic spacer to the infected left total knee.  BRIEF HISTORY:  This is a patient who underwent a left total knee resection some time ago.  She had an I and D prior to the holidays and has had an antibiotic spacer in place.  She comes in today for reevaluation.  She was ready at this point to have the reimplantation completed.  It was scheduled for December 25, 2010.  PAST MEDICAL HISTORY:  Past medical history is fairly benign. Significant only for: 1. Some history of shingles in 1995. 2. She does have osteoarthritis.  PAST SURGICAL HISTORY:  She has had previous surgeries in the left hip, right knee, multiple on the left knee.  CURRENT MEDICATIONS: 1. Hydrocodone 7.5/325 as needed. 2. Colace as needed. 3. Methocarbamol 500 mg as needed. 4. A PEG medicine as liquid for constipation, uses daily. 5. Amlodipine 5 mg every other day. 6. Calcium and vitamin D daily. 7. Metoprolol 25 mg daily. 8. Sodium chloride. 9. Hep-lock. 10.Docusate sodium. 11.Ferrous sulfate daily. 12.Acetaminophen as needed. 13.Oyster Shell daily. 14.Jantoven 2.5 mg daily.  ALLERGIES:  She indicates medicine allergies to SULFA drugs and SOME ANTIBIOTICS, that she is not sure which one.  SOCIAL HISTORY:  The patient is married.  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 She has no history of alcohol or drug abuse or tobacco use.  She has 3 children.  FAMILY HISTORY:  Noncontributory.  REVIEW OF SYSTEMS:  Notable for those difficulties described in  the history of present illness, past medical history, or review of system sheets, otherwise unremarkable.  PHYSICAL EXAMINATION:  VITAL SIGNS:  The patient is 5 feet 3 inches, 114 pounds, in fair health.  Blood pressure is 142/96, respirations 18, pulse 72. GENERAL:  Health is fair to poor. HEENT:  Normocephalic.  She does wear reading glasses. NECK:  Unremarkable. CHEST:  Unremarkable.  Clear to auscultation bilaterally. HEART:  S1, S2.  There are no murmurs, rubs, or gallops. GI/GU:  Abdomen is soft and nondistended.  Otherwise unremarkable. EXTREMITIES:  Shows the history of the knee and hip surgeries with osteoarthritis.  DERMATOLOGIC:  She has a left knee wound. NEUROLOGIC:  She had shingles in 1995, otherwise she is intact.  Her labs, EKG and chest x-ray are pending through [facility].  IMPRESSION AND PLAN:  Resection of infected hardware with reimplantation scheduled for December 25, 2010.  The plan will be to admit her on December 25, 2010, for reimplantation of the hardware.  DISCHARGE MEDICATIONS:  Including Xarelto, Robaxin, MiraLax, Colace, and iron were given to her today.  Her pain medicines will be given to her at  discharge.     Russell L. Webb Silversmith, RN   ______________________________ Madlyn Frankel Charlann Boxer, M.D.    RLW/MEDQ  D:  12/06/2010  T:  12/06/2010  Job:  [id number]  Electronically Signed by Lauree Chandler NP-C on 12/26/2010 09:43:35 AM Electronically Signed by [name] M.D. on 12/30/2010 09:18:06 AM

## 2010-12-30 NOTE — Op Note (Signed)
[name], [name]                 ACCOUNT NO.:  [id number]  MEDICAL RECORD NO.:  [id number]           PATIENT TYPE:  I  LOCATION:  0004                         FACILITY:  [facility]  PHYSICIAN:  Madlyn Frankel. Charlann Boxer, M.D.  DATE OF BIRTH:  [birth year]  DATE OF PROCEDURE:  12/25/2010 DATE OF DISCHARGE:                              OPERATIVE REPORT   PREOPERATIVE DIAGNOSIS:  Status post resection of an infected left total- knee replacement.  POSTOPERATIVE DIAGNOSIS:  Status post resection of an infected left total-knee replacement.  PROCEDURE:  Reimplantation of the left total-knee replacement utilizing DePuy component with a size 3 TC3 femur, neutral bolt 5 degrees angle with a 31 cemented sleeve and a 13 x 30 cemented stem.  They had an 8-mm posterior medial augment and a 4-mm distal medial augment.  The tibia size was a size 2.5 MBT revision tray with a 29 cemented sleeve and a 13 x 30 cemented stem, 41 patellar button and a 15-mm TC3 insert.SURGEON:  Madlyn Frankel. Charlann Boxer, M.D.  ASSISTANT:  Nelia Shi. Webb Silversmith, RN  ANESTHESIA:  Spinal.  SPECIMENS:  None.  DRAINS:  One Hemovac.  COMPLICATIONS:  None.  TOURNIQUET TIME:  67 minutes at 225 mmHg.  ESTIMATED BLOOD LOSS:  About 100 cc.  INDICATIONS FOR THE PROCEDURE:  Ms. Upperman is an 77 year old patient of mine with a history of bilateral total-knee replacements.  Her left knee developed an infection, perhaps related to some cellulitis with recurrent effusions.  She is approximately at 9 weeks after resection and placement of antibiotic spacer and 6 weeks of IV antibiotics.  She had returned to the office without findings for infection.  No swelling of the knee.  No fluid.  She was prepared for reimplantation and ready for it.  Risks and benefits of recurrent infections as well as DVT, stiffness, et Karie Soda were reviewed.  Consent was obtained for the benefit of pain relief.  PROCEDURE IN DETAIL:  The patient was brought to the operative  theater. Once adequate anesthesia, preoperative antibiotics, Ancef administered, the patient was positioned supine with a left thigh tourniquet placed. The left lower extremity was then prepped and draped in the sterile fashion with the foot in a DeMayo leg holder.  Time-out was performed identifying the patient, planned procedure and the extremity.  Leg was exsanguinated, tourniquet elevated to 225 mmHg.  The patient's old incision was utilized.  Sharp dissection was carried down to the extensor mechanism and a soft tissue plane was created.  A median arthrotomy was made removing old sutures.  Following initial exposure and recreation of the suprapatellar pouch, medial and lateral gutters, the suprapatellar shield of cement was removed followed by removal of the cement blocker as well as cement within the femoral and tibial space.  Following debridement of fibrinous tissue around the tibia and the femur, I opened up the canals and reamed up to 15 mm for a depth of 13 x 30 cemented stems on each side.  Based on bone loss on the proximal tibia and distal femur, I was going to use sleeves for help with the rotational support.  Tibial  preparation was carried out first using an extramedullary guide. I freshened up the cut to make certain it was perpendicular in the coronal plane.  I then sized it and felt that a size 2.5 tibial tray was going to fit best.  It was pinned into position, confirmed the alignment and then drilled and then broached.  Trial 2.5 tibial tray with a 13 x 30 stem and a 29 sleeve was inserted and left in place to help with the rotation of the femoral component.  On the distal femur, I drilled and then broached up for the 31 sleeve. Based off of this and the depth of it, all other cuts were made.  A distal femoral cutting guide was placed and I ended up removing very little bone off the distal lateral aspect of the femur and on the distal medial needed to remove down  to a 4-mm augment.  Basically it was the medial and lateral epicondylar bone that was giving me the joint line reference, as there was no significant metaphyseal bone left.  With the broach in place, again I sized the femur to be a size 3.  The size 3 block was then pinned into position based off the proximal tibial tray as the rotation guide.  I used a 0 degree bolt.  A little bit of bone was removed off the anterior medial aspect of the femur and distally on the lateral side very little bone was removed.  However, on the posterior medial side there was the need for an 8-mm augment to support this.  At this point, the box cutting guide was positioned and very little bone was necessary to be removed for a TC3 component.  I freshened up the chamfer cuts as needed.  At this point, a trial femur was made with the above components.  With the trials in place I determined that a 15-mm insert was going to have stable ligament balance from extension and flexion.  The knee came to full extension.  On the patella side, I removed the fibrinous bone and then just freshened up a little bit of the cut.  The post cut depth with about 12-13 mm.  This seemed to be best fit with a 41 patellar button. Lug holes were re-drilled.  The patella tracked through the trochlea without application of pressure.  At this point, all trial components were removed after final debridement of the canal at the proximal aspect of the tibia and distal femur.  Cement restrictors were chosen and placed.  I irrigated out the canals first with the canal irrigator and then the regular shower brush.  The final components were opened as noted and configured on the back table.  Once they were made, including the sleeve, the cement was mixed.  Three batches of gentamicin impregnated cement were utilized.  The final components were then cemented onto dried surfaces of bone.  The knee was brought to extension with a 15-mm insert  and then allowed for the cement to cure.  Extruded cement was removed.  Once the cement had fully cured and the excessive cement was removed throughout the knee, I chose to use a 15-mm TC3 insert, not due to ligament issues and extension but just to help provide a little bit of support of the knee with flexion.  The knee was re-irrigated.  The tourniquet was let down after 67 minutes and no significant hemostasis was required.  I placed a medium Hemovac drain deep.  I re-irrigated the knee  and reapproximated the extensor mechanism using #1 Vicryl.  The remainder of the wound was closed with 2- 0 Vicryl and staples on the skin.  The skin was cleaned, dried and dressed sterilely with an Aquacel dressing and an Ace wrap.  She was then brought to the recovery room in stable condition, tolerating the procedure well.     Madlyn Frankel Charlann Boxer, M.D.     MDO/MEDQ  D:  12/25/2010  T:  12/25/2010  Job:  [id number]  Electronically Signed by [name] M.D. on 12/30/2010 09:18:12 AM

## 2011-01-07 NOTE — Discharge Summary (Signed)
  [name], BELOW                 ACCOUNT NO.:  [id number]  MEDICAL RECORD NO.:  [id number]           PATIENT TYPE:  I  LOCATION:  1615                         FACILITY:  [facility]  PHYSICIAN:  Madlyn Frankel. Charlann Boxer, M.D.  DATE OF BIRTH:  [birth year]  DATE OF ADMISSION:  12/25/2010 DATE OF DISCHARGE:                              DISCHARGE SUMMARY   ADMITTING DIAGNOSIS:  Infected left knee arthroplasty resection with reimplantation of left knee arthroplasty.  BRIEF HISTORY:  The patient underwent a left knee arthroplasty last year and has had an infected knee and had the hardware removed.  She had antibiotics put in for some time and was admitted on February 7 for reimplantation of her arthroplasty.  DISCHARGE DIAGNOSES: 1. Infected left knee. 2. Reimplantation of arthroplasty. 3. Generalized osteoarthritis. 4. She has a history of shingles in the past.  HOSPITAL COURSE:  The patient was admitted on February 7, taken to theoperating theater and underwent the reimplantation of the arthroplasty of the left knee without any difficulty.  She was brought to the PACU for recovery and brought to [facility] for further recovery and rehabilitation.  Since that time she has been slow to progress, but she has done well.  She will be discharged to a skilled nursing facility today for further rehabilitation.  She has advanced her diet to regular and she has been up with physical therapy.  DISCHARGE INSTRUCTIONS:  Discharge instructions were given.  She knows to keep the wound clean and dry.  She has an Aquacel dressing in place and the facility needs to remove that on Wednesday February 15 and redress the wound.  She will follow up with [name] in two weeks.  She can shower.  She has the Aquacel dressing in place.  However, she does need to have that removed in 8 days.  She will otherwise keep the wound clean and dry.  DISCHARGE MEDICATIONS: 1. Acetaminophen 325 mg every 4 hours as needed. 2.  Colace 100 mg twice daily. 3. Ferrous sulfate 325 mg three times a day for 3 weeks. 4. Robaxin 500 mg every 6 hours as needed. 5. MiraLax 17 grams a day as needed. 6. Tramadol one to two every 4-6 hours as needed. 7. Coumadin daily per protocol for an INR of 2 to 2.5. 8. Amlodipine 5 mg every morning. 9. Enteric-coated aspirin 325 mg a day. 10.Dulcolax 5 mg as needed. 11.Calcium carbonate daily. 12.Metoprolol 25 mg daily.  We are looking for an INR goal of 2 to 2.5     on her to be maintained at the facility.     Russell L. Webb Silversmith, RN   ______________________________ Madlyn Frankel Charlann Boxer, M.D.    RLW/MEDQ  D:  12/28/2010  T:  12/28/2010  Job:  [id number]  Electronically Signed by Lauree Chandler NP-C on 12/31/2010 02:54:48 PM Electronically Signed by [name] M.D. on 01/07/2011 10:48:41 AM

## 2011-01-28 LAB — CBC
HCT: 31.9 % — ABNORMAL LOW (ref 36.0–46.0)
HCT: 35.5 % — ABNORMAL LOW (ref 36.0–46.0)
Hemoglobin: 10.4 g/dL — ABNORMAL LOW (ref 12.0–15.0)
Hemoglobin: 11.6 g/dL — ABNORMAL LOW (ref 12.0–15.0)
Hemoglobin: 9.9 g/dL — ABNORMAL LOW (ref 12.0–15.0)
MCH: 29.1 pg (ref 26.0–34.0)
MCH: 29.2 pg (ref 26.0–34.0)
MCHC: 32.6 g/dL (ref 30.0–36.0)
MCHC: 32.8 g/dL (ref 30.0–36.0)
Platelets: 226 10*3/uL (ref 150–400)
WBC: 7.9 10*3/uL (ref 4.0–10.5)

## 2011-01-28 LAB — BASIC METABOLIC PANEL
CO2: 22 mEq/L (ref 19–32)
CO2: 23 mEq/L (ref 19–32)
Calcium: 8 mg/dL — ABNORMAL LOW (ref 8.4–10.5)
Creatinine, Ser: 0.86 mg/dL (ref 0.4–1.2)
GFR calc non Af Amer: >60 mL/min (ref >60)
Glucose, Bld: 176 mg/dL — ABNORMAL HIGH (ref 70–99)
Glucose, Bld: 177 mg/dL — ABNORMAL HIGH (ref 70–99)
Glucose, Bld: 97 mg/dL (ref 70–99)
Potassium: 4.2 mEq/L (ref 3.5–5.1)
Potassium: 4.5 mEq/L (ref 3.5–5.1)
Sodium: 135 mEq/L (ref 135–145)
Sodium: 138 mEq/L (ref 135–145)

## 2011-01-28 LAB — ANAEROBIC CULTURE

## 2011-01-28 LAB — BODY FLUID CULTURE: Culture: NO GROWTH

## 2011-01-28 LAB — GRAM STAIN

## 2011-01-28 LAB — TYPE AND SCREEN
ABO/RH(D): O NEG
Antibody Screen: NEGATIVE

## 2011-01-28 LAB — PROTIME-INR
INR: 1.34 (ref 0.00–1.49)
Prothrombin Time: 15.2 seconds (ref 11.6–15.2)
Prothrombin Time: 16.8 seconds — ABNORMAL HIGH (ref 11.6–15.2)

## 2011-01-29 ENCOUNTER — Encounter: Payer: Self-pay | Admitting: *Deleted

## 2011-01-29 LAB — CBC
MCHC: 32.6 g/dL (ref 30.0–36.0)
MCV: 89.2 fL (ref 78.0–100.0)
Platelets: 311 10*3/uL (ref 150–400)
RDW: 13.3 % (ref 11.5–15.5)
WBC: 8.1 10*3/uL (ref 4.0–10.5)

## 2011-01-29 LAB — URINALYSIS, ROUTINE W REFLEX MICROSCOPIC
Ketones, ur: NEGATIVE mg/dL
Nitrite: NEGATIVE
Protein, ur: NEGATIVE mg/dL

## 2011-01-29 LAB — DIFFERENTIAL
Basophils Absolute: 0 10*3/uL (ref 0.0–0.1)
Basophils Relative: 1 % (ref 0–1)
Eosinophils Relative: 1 % (ref 0–5)
Lymphocytes Relative: 14 % (ref 12–46)
Neutro Abs: 6.4 10*3/uL (ref 1.7–7.7)

## 2011-01-29 LAB — BASIC METABOLIC PANEL
BUN: 21 mg/dL (ref 6–23)
Creatinine, Ser: 0.97 mg/dL (ref 0.4–1.2)
GFR calc non Af Amer: 55 mL/min — ABNORMAL LOW (ref >60)

## 2011-01-29 LAB — PROTIME-INR: Prothrombin Time: 18.9 seconds — ABNORMAL HIGH (ref 11.6–15.2)

## 2011-01-29 LAB — SURGICAL PCR SCREEN
MRSA, PCR: NEGATIVE
Staphylococcus aureus: POSITIVE — AB

## 2011-02-01 LAB — PROTIME-INR: Prothrombin Time: 18 seconds — ABNORMAL HIGH (ref 11.6–15.2)

## 2011-02-05 LAB — BASIC METABOLIC PANEL
BUN: 12 mg/dL (ref 6–23)
CO2: 25 mEq/L (ref 19–32)
GFR calc non Af Amer: >60 mL/min (ref >60)
Glucose, Bld: 150 mg/dL — ABNORMAL HIGH (ref 70–99)
Potassium: 3.9 mEq/L (ref 3.5–5.1)

## 2011-02-05 LAB — SEDIMENTATION RATE: Sed Rate: 35 mm/hr — ABNORMAL HIGH (ref 0–22)

## 2011-02-05 LAB — DIFFERENTIAL
Basophils Absolute: 0 10*3/uL (ref 0.0–0.1)
Basophils Relative: 0 % (ref 0–1)
Eosinophils Absolute: 0.5 10*3/uL (ref 0.0–0.7)
Eosinophils Relative: 9 % — ABNORMAL HIGH (ref 0–5)
Monocytes Absolute: 0.2 10*3/uL (ref 0.1–1.0)

## 2011-02-05 LAB — CBC
HCT: 30.5 % — ABNORMAL LOW (ref 36.0–46.0)
Hemoglobin: 9.9 g/dL — ABNORMAL LOW (ref 12.0–15.0)
MCHC: 32.4 g/dL (ref 30.0–36.0)
MCV: 83.5 fL (ref 78.0–100.0)
Platelets: 219 10*3/uL (ref 150–400)
RDW: 14.9 % (ref 11.5–15.5)

## 2011-02-06 LAB — BASIC METABOLIC PANEL
BUN: 19 mg/dL (ref 6–23)
CO2: 25 mEq/L (ref 19–32)
CO2: 28 mEq/L (ref 19–32)
Chloride: 106 mEq/L (ref 96–112)
Chloride: 108 mEq/L (ref 96–112)
Chloride: 108 mEq/L (ref 96–112)
Creatinine, Ser: 0.75 mg/dL (ref 0.4–1.2)
Creatinine, Ser: 0.76 mg/dL (ref 0.4–1.2)
Creatinine, Ser: 0.78 mg/dL (ref 0.4–1.2)
GFR calc Af Amer: >60 mL/min (ref >60)
GFR calc Af Amer: >60 mL/min (ref >60)
Glucose, Bld: 166 mg/dL — ABNORMAL HIGH (ref 70–99)
Potassium: 3.7 mEq/L (ref 3.5–5.1)
Sodium: 139 mEq/L (ref 135–145)
Sodium: 139 mEq/L (ref 135–145)

## 2011-02-06 LAB — WOUND CULTURE

## 2011-02-06 LAB — ANAEROBIC CULTURE

## 2011-02-06 LAB — CBC
HCT: 36.6 % (ref 36.0–46.0)
Hemoglobin: 10.1 g/dL — ABNORMAL LOW (ref 12.0–15.0)
Hemoglobin: 10.3 g/dL — ABNORMAL LOW (ref 12.0–15.0)
MCHC: 32.5 g/dL (ref 30.0–36.0)
MCHC: 33.6 g/dL (ref 30.0–36.0)
MCV: 83.9 fL (ref 78.0–100.0)
MCV: 84.5 fL (ref 78.0–100.0)
MCV: 85.6 fL (ref 78.0–100.0)
Platelets: 426 10*3/uL — ABNORMAL HIGH (ref 150–400)
RBC: 3.59 MIL/uL — ABNORMAL LOW (ref 3.87–5.11)
RBC: 3.69 MIL/uL — ABNORMAL LOW (ref 3.87–5.11)
WBC: 7.9 10*3/uL (ref 4.0–10.5)
WBC: 9.3 10*3/uL (ref 4.0–10.5)

## 2011-02-06 LAB — DIFFERENTIAL
Basophils Relative: 0 % (ref 0–1)
Eosinophils Absolute: 0.1 10*3/uL (ref 0.0–0.7)
Eosinophils Relative: 1 % (ref 0–5)
Lymphs Abs: 1.1 10*3/uL (ref 0.7–4.0)
Monocytes Relative: 6 % (ref 3–12)
Neutrophils Relative %: 81 % — ABNORMAL HIGH (ref 43–77)

## 2011-02-06 LAB — PROTIME-INR
INR: 1.18 (ref 0.00–1.49)
Prothrombin Time: 14.9 seconds (ref 11.6–15.2)

## 2011-02-28 LAB — PROTIME-INR
INR: 1.1 (ref 0.00–1.49)
Prothrombin Time: 14 seconds (ref 11.6–15.2)

## 2011-02-28 LAB — CBC
HCT: 30.4 % — ABNORMAL LOW (ref 36.0–46.0)
Hemoglobin: 10 g/dL — ABNORMAL LOW (ref 12.0–15.0)
Hemoglobin: 10.4 g/dL — ABNORMAL LOW (ref 12.0–15.0)
MCHC: 33.3 g/dL (ref 30.0–36.0)
MCV: 87.7 fL (ref 78.0–100.0)
Platelets: 223 10*3/uL (ref 150–400)
Platelets: 250 10*3/uL (ref 150–400)
RBC: 3.49 MIL/uL — ABNORMAL LOW (ref 3.87–5.11)
RDW: 12.9 % (ref 11.5–15.5)
RDW: 13.2 % (ref 11.5–15.5)
WBC: 11.8 10*3/uL — ABNORMAL HIGH (ref 4.0–10.5)

## 2011-02-28 LAB — DIFFERENTIAL
Basophils Relative: 1 % (ref 0–1)
Eosinophils Absolute: 0.2 10*3/uL (ref 0.0–0.7)
Neutrophils Relative %: 72 % (ref 43–77)

## 2011-02-28 LAB — URINE MICROSCOPIC-ADD ON

## 2011-02-28 LAB — URINALYSIS, ROUTINE W REFLEX MICROSCOPIC
Bilirubin Urine: NEGATIVE
Bilirubin Urine: NEGATIVE
Hgb urine dipstick: NEGATIVE
Hgb urine dipstick: NEGATIVE
Specific Gravity, Urine: 1.01 (ref 1.005–1.030)
Specific Gravity, Urine: 1.02 (ref 1.005–1.030)
Urobilinogen, UA: 0.2 mg/dL (ref 0.0–1.0)
pH: 6.5 (ref 5.0–8.0)

## 2011-02-28 LAB — BASIC METABOLIC PANEL
BUN: 12 mg/dL (ref 6–23)
BUN: 15 mg/dL (ref 6–23)
CO2: 28 mEq/L (ref 19–32)
Calcium: 8.1 mg/dL — ABNORMAL LOW (ref 8.4–10.5)
Chloride: 107 mEq/L (ref 96–112)
Creatinine, Ser: 0.65 mg/dL (ref 0.4–1.2)
Creatinine, Ser: 0.76 mg/dL (ref 0.4–1.2)
GFR calc Af Amer: >60 mL/min (ref >60)
GFR calc non Af Amer: >60 mL/min (ref >60)
GFR calc non Af Amer: >60 mL/min (ref >60)
Glucose, Bld: 152 mg/dL — ABNORMAL HIGH (ref 70–99)
Potassium: 3.9 mEq/L (ref 3.5–5.1)
Sodium: 135 mEq/L (ref 135–145)
Sodium: 139 mEq/L (ref 135–145)

## 2011-02-28 LAB — TYPE AND SCREEN
ABO/RH(D): O NEG
Antibody Screen: NEGATIVE

## 2011-04-02 NOTE — Op Note (Signed)
[name], [name]                 ACCOUNT NO.:  [id number]   MEDICAL RECORD NO.:  [id number]          PATIENT TYPE:  INP   LOCATION:  0002                         FACILITY:  [facility]   PHYSICIAN:  Madlyn Frankel. Charlann Boxer, M.D.  DATE OF BIRTH:  Jun 11, 1928   DATE OF PROCEDURE:  01/18/2008  DATE OF DISCHARGE:                               OPERATIVE REPORT   PREOPERATIVE DIAGNOSIS:  Right knee osteoarthritis.   POSTOPERATIVE DIAGNOSIS:  Right knee osteoarthritis.   PROCEDURE:  Right total knee replacement.   COMPONENTS USED:  DePuy rotating platform posterior stabilized knee  system, size 2.5 femur, 2.5 tibia, 10 mm insert, 35 patella button.   SURGEON:  Dr. [name].   ASSISTANT:  Dwyane Luo, PA-C.   ANESTHESIA:  Dermal with spinal.   DRAINS:  Drains x1.   COMPLICATIONS:  None.   TOURNIQUET TIME:  40 minutes at 250 mmHg.   INDICATIONS FOR PROCEDURE:  [name] is a very pleasant, 77 year old  female who I have been following for some time with a history of total  hip replacement with persistent and progressive pain with her bilateral  knee osteoarthritis, right greater left. She failed conservative  measures and after considering her options of nonoperative care versus  operative intervention she elected to proceed with total knee  replacement. The risks and benefits were discussed. The hospital course,  expectations, postoperative pain and plan were all reviewed.  Consent  was obtained.   PROCEDURE IN DETAIL:  The patient was brought to operative theater.  Once adequate anesthesia, preoperative antibiotics, Ancef, administered,  the patient was positioned supine and a proximal thigh tourniquet  placed.  The right lower extremity was prescrubbed and then prepped and  draped in a sterile fashion.  The leg was exsanguinated, tourniquet  elevated and a midline incision was made. A median arthrotomy was  created.  After initial debridement, attention was first directed to the  patella. A precut measurement was to 24-25 mm.  I resected down to 14-15  mm and a 35 patellar button fit best.   A metal shim was placed protecting the patella cut surface from  retractors.   The attention was now directed to the femur.  The femoral canal was  opened using a starting drill, irrigated to prevent fat emboli.  The  intramedullary rod was then placed in 5 degrees valgus and resected 10  mm of bone off the distal femur.  I then sized the femur to be a size  2.5. The posterior condylar axis was perpendicular to the Whiteside's  line.   These cuts were made without difficulty.  A final box cut was then made  off the lateral aspect of the distal femur.   The tibia was now subluxated anteriorly with retractors placed,  extramedullary guide. I resected approximately 4 mm off the lateral  proximal tibia.  This appeared to be an adequate cut. With the cutting  block pinned in position, the cut was made without difficulty. An  extension block was then placed between the cut surface of the knee and  came out to  full extension. At this point, pins were removed.   Final tibial preparation was carried out with the size 2.5 tibial tray  pinned in position. I checked with an alignment rod and was happy that  the cup was perpendicular in both planes. I then drilled and then  performed a trial reduction. I was very happy with the stability of the  knee with a size 10 insert with a knee coming out to full extension with  stable ligaments through reflection. The patella tracked without  application of pressure. All trial components were removed, the knee was  irrigated with normal saline solution.  The synovial capsule layer was  injected with 60 mL of 0.25% Marcaine with epinephrine and 1 mL of  Toradol.  Final components were opened and cement mixed.  The components  were then cemented in position, the knee brought out to extension with a  10 mm trial insert. The extruded cement was  removed.  Once the excessive  cement was removed from the knee and cement cured, I checked the knee  for remaining fragments of cement. Once I was satisfied there were none,  the final insert was placed, the tourniquet was let down at 40 minutes.  There was no significant hemostasis that was required.  There was some  bone oozing and generalized synovial oozing but no active bleeding.  We  re-irrigated the knee at this point, placed a medium Hemovac drain deep  into the joint and then brought the knee to flexion. With the knee at 90  degrees of flexion, I  reapproximated the extensor mechanism using #1  Vicryl.  The remainder of the wound was closed with 2-0 Vicryl and  running 4-0 Monocryl.  The knee was then cleaned, dried and dressed  sterilely with Steri-Strips and a bulky sterile dressing.  She was  brought to the recovery room in stable condition.      Madlyn Frankel Charlann Boxer, M.D.  Electronically Signed     MDO/MEDQ  D:  01/18/2008  T:  01/18/2008  Job:  [id number]

## 2011-04-02 NOTE — H&P (Signed)
[name], [name]                 ACCOUNT NO.:  [id number]   MEDICAL RECORD NO.:  [id number]          PATIENT TYPE:  INP   LOCATION:  NA                           FACILITY:  [facility]   PHYSICIAN:  Madlyn Frankel. Charlann Boxer, M.D.  DATE OF BIRTH:  [birth year]   DATE OF ADMISSION:  01/24/2009  DATE OF DISCHARGE:                              HISTORY & PHYSICAL   PROCEDURE:  Left total knee replacement.   CHIEF COMPLAINT:  Left knee pain.   HISTORY OF PRESENT ILLNESS:  The patient is a 77 year old female with a  history of left knee pain secondary to osteoarthritis.  She has a  history of right total knee replacement that has done very well.  She  has regained full motion.  Her left knee has been refractory to all  conservative treatment.  Preoperatively, she states she has recently  seen Dr. Lestine Box at our practice as well for some right second toe pain  and is being seen and treated by Dr. Lestine Box with potential need for  surgery of this postoperatively once she heals from her knee  replacement.   Dictation ended at this point.     ______________________________  Yetta Glassman. [address], [place]      Madlyn Frankel. Charlann Boxer, M.D.  Electronically Signed    BLM/MEDQ  D:  01/18/2009  T:  01/18/2009  Job:  [id number]

## 2011-04-02 NOTE — H&P (Signed)
[name], [name]                 ACCOUNT NO.:  [id number]   MEDICAL RECORD NO.:  [id number]          PATIENT TYPE:  INP   LOCATION:  NA                           FACILITY:  [facility]   PHYSICIAN:  [name], M.D.  DATE OF BIRTH:  20-Jul-1928   DATE OF ADMISSION:  01/24/2009  DATE OF DISCHARGE:                              HISTORY & PHYSICAL   PROCEDURES:  Left total knee replacement.   CHIEF COMPLAINT:  Left knee pain.   HISTORY OF PRESENT ILLNESS:  A 77 year old female with a history of left  knee pain secondary to osteoarthritis.  It has been refractory to all  conservative treatment.  She has a history of a right total knee  replacement, doing well.  Has gotten good results from that.   PRIMARY CARE PHYSICIAN:  None listed.   MEDICAL HISTORY:  1. Osteoarthritis.  2. Hiatal hernia.   PAST SURGICAL HISTORY:  1. Hip replacement in 2006.  2. Right total knee replacement in 2009.   FAMILY HISTORY:  Cancer.   SOCIAL HISTORY:  Married, self-employed, nonsmoker.  Primary caregiver  will be family in the home.   DRUG ALLERGIES:  SULFA DRUGS.   CURRENT MEDICATIONS:  1. Aleve 2 tablets daily.  2. Osteo Bi-Flex daily.  3. Tylenol p.r.n.  4. Prilosec p.r.n.   REVIEW OF SYSTEMS:  MUSCULOSKELETAL:  She has right second toe pain due  to a bone spur.  She has seen Dr. Lestine Box.  After recovery from her knee  replacement, she is going to see him for surgical definitive management.  Otherwise, see HPI.   PHYSICAL EXAMINATION:  Pulse 64, respirations 16, blood pressure 148/80.  GENERAL:  Awake, alert, and oriented.  HEENT:  Normocephalic.  NECK:  Supple.  No carotid bruits.  CHEST/LUNGS:  Clear to auscultation bilaterally.  BREASTS:  Deferred.  HEART:  S1, S2 distinct.  ABDOMEN:  Soft, nontender, nondistended.  Bowel sounds present.  PELVIS:  Stable.  GENITOURINARY:  Deferred.  EXTREMITIES:  Left knee 3-120 with valgus deformity.  Her right knee has  a total knee  replacement midline incision.  Her range of motion of this  knee is 3-120.  SKIN:  Left knee:  No cellulitis.  NEUROLOGIC:  Intact distal sensibilities.   LABORATORY DATA:  Labs, EKG, chest x-ray all pending presurgical  testing.   IMPRESSION:  Left knee osteoarthritis.   PLAN OF ACTION:  Left total knee replacement by Dr. Charlann [name] at [facility] on January 24, 2009.   Postoperative medications were provided including aspirin for DVT  prophylaxis.     ______________________________  [name], [place]      [name], M.D.  Electronically Signed    BLM/MEDQ  D:  01/18/2009  T:  01/19/2009  Job:  [id number]

## 2011-04-02 NOTE — Op Note (Signed)
[name], [name]                 ACCOUNT NO.:  [id number]   MEDICAL RECORD NO.:  [id number]          PATIENT TYPE:  INP   LOCATION:  0002                         FACILITY:  [facility]   PHYSICIAN:  Madlyn Frankel. Charlann Boxer, M.D.  DATE OF BIRTH:  1927-12-07   DATE OF PROCEDURE:  01/24/2009  DATE OF DISCHARGE:                               OPERATIVE REPORT   PREOPERATIVE DIAGNOSES:  Left knee osteoarthritis with valgus deformity.   POSTOPERATIVE DIAGNOSES:  1. Left knee osteoarthritis with valgus deformity.  2. History of right total knee replacement that she has done well      from.   PROCEDURE:  Left total knee replacement.   COMPONENTS USED:  DePuy rotating platform posterior stabilized knee  system, size 3 femur, 2.5 tibia, 10 mm insert and a 35 patellar button.   SURGEON:  Madlyn Frankel. Charlann Boxer, M.D.   ASSISTANT:  Yetta Glassman. Mann, PA.   ANESTHESIA:  Duramorph spinal.   COMPLICATIONS:  None.   DRAINS:  One Hemovac.   TOURNIQUET TIME:  39 minutes at 250 mmHg.   SPECIMENS:  None.   INDICATIONS FOR PROCEDURE:  [name] is a 77 year old female who I  have been following for bilateral arthritis.  She had a history of left  total hip replacement and right total hip replacement by me.  She has  significant valgus deformity, gait disturbance resulting in lower back  pain.  Due to the advanced nature of her arthritis and inability to  manage her discomfort I suggested she consider arthroplasty.  We  reviewed the risks and benefits and she wished to proceed.  Consent was  obtained based on our discussion and her previous experience.   PROCEDURE IN DETAIL:  The patient was brought to the operating room  theater.  Once adequate anesthesia, preoperative antibiotics, Ancef  administered the patient was positioned supine with a thigh tourniquet  placed.  The left lower extremity pre-scrubbed, prepped and draped in  sterile fashion.   A time-out was performed identifying the patient, the  planned procedure  and the extremity.  The leg was exsanguinated, tourniquet elevated to  250 mmHg.  A midline incision was made, followed by a median arthrotomy.  Following a small medial proximal exposure, as well as some lateral up  to the proximal lateral tibia, attention was directed first the patella.  The precut measurement was 23 mm.  I resected down to 14 mm and used a  35 patellar button as was used on the contralateral knee.   At this point, a metal shim was placed into the lug holes to protect the  cut surface from retractors and saw blades.   Attention was now directed the femur.  The femoral canal was opened with  a drill and irrigated to prevent fat emboli.  An intramedullary rod was  then passed and at 3 degrees of valgus I resected 10 mm bone off the  distal femur, amounting to a small cut off the distal lateral femur.   The tibia was then subluxated anteriorly and with an extramedullary  guide I resected 2 mm  of bone off the lateral proximal tibia initially.  When I initially checked with an extension block I felt that it was  tight in extension.  Thus, I removed 2 mm more of bone by dropping the  block down one hole.   At this point, the knee came out to full extension.  I checked the cut  surface to make sure that I was happy that it was a perpendicular cut  and it was.   This was important as the rotation of the femoral component was based  off it.  I then sized the femur to be a size 3.  We then placed the size  3 rotation block.  When I did this I felt that the femur was still a  little bit internally rotated in relationship to [employer] and for  this reason I externally rotated by elevating the medial side just a  little bit.   The threaded pins were placed and the anterior, posterior and chamfer  cuts were then made without difficulty nor notching.  The final box cut  was made off the lateral aspect of the distal femur.   At this point, the tibia  was resubluxated anteriorly and a size 2.5  tibial tray fit best on this cut surface.  I pinned it in position to  the medial third of the tubercle then drilled and keel punched.  At this  point, trial reduction was carried out with a 3 femur, 2.5 tibia and a  10 mm insert.  The knee came to full extension and flexed nicely with  the patella tracking through the trochlea without application of  pressure.  At this point, all trial components were removed.  The final  components were opened.  We injected the synovial capsule junction with  60 mL of 0.25% Marcaine with epinephrine and 1 mL of Toradol.  The knee  was irrigated with normal saline solution.  The cement was mixed and the  final components were then cemented into position with the knee brought  to full extension to allow for the cement to cure.  Extruded cement was  removed.  Once cement cured, excessive cement was removed throughout the  knee.  The final 10 mm insert to match the size 3 femur was then  inserted.  We re-irrigated the knee at this point and removed any debris  as necessary.  A Hemovac drain was placed deep.  The tourniquet was let  down at 39 minutes at 250 mmHg.  The extensor mechanism was then  reapproximated using a #1 Vicryl with the knee in flexion.  The  remainder of the wound was closed in layers with 2-0 Vicryl and a 4-0  running Monocryl.  The knee was cleaned, dried and dressed sterilely  with Steri-Strips and bulky sterile wrap.  She was then brought to the  recovery room in stable condition tolerating the procedure well.      Madlyn Frankel Charlann Boxer, M.D.  Electronically Signed     MDO/MEDQ  D:  01/24/2009  T:  01/24/2009  Job:  [id number]

## 2011-04-02 NOTE — H&P (Signed)
[name], [name]                 ACCOUNT NO.:  [id number]   MEDICAL RECORD NO.:  [id number]          PATIENT TYPE:  INP   LOCATION:  NA                           FACILITY:  [facility]   PHYSICIAN:  Madlyn Frankel. Charlann Boxer, M.D.  DATE OF BIRTH:  Jan 14, 1928   DATE OF ADMISSION:  01/18/2008  DATE OF DISCHARGE:                              HISTORY & PHYSICAL   SURGEON:  Dr. [name].   PROCEDURE:  Right total knee replacement.   CHIEF COMPLAINTS:  Right knee pain.   HISTORY OF PRESENT ILLNESS:  A 77 year old female with a history of  right knee pain secondary to osteoarthritis.  It has been refractory to  all conservative treatment including oral anti-inflammatories, cortisone  injection and arthroscopic surgery.  She has been presurgically assessed  by her primary care physician, Dr. Jarome Matin.   PAST MEDICAL HISTORY:  Osteoarthritis.   PAST SURGICAL HISTORY:  1. Left total hip replacement.  2. Right knee scope.   FAMILY HISTORY:  Diabetes.   SOCIAL HISTORY:  The patient is married.  Primary caregiver after  surgery will be family versus SNF.   DRUG ALLERGIES:  SULFA.   MEDICATIONS:  1. Alleve 4 tablets two p.o. b.i.d.  2. Tylenol p.r.n.  3. Osteo Bioflex double strength one daily.  4. Calcium 600 mg one p.o. daily.  5. Prilosec p.r.n.   REVIEW OF SYSTEMS:  See HPI.   PHYSICAL EXAMINATION:  VITAL SIGNS:  Pulse 64, respirations 18, blood  pressure 116/72.  GENERAL:  Awake, alert and oriented, well-developed, well-nourished, no  acute distress.  NECK:  Supple.  No carotid bruits.  CHEST/LUNGS:  Clear to auscultation bilaterally.  BREASTS:  Deferred.  HEART:  Regular rate and rhythm without gallops, clicks, rubs or  murmurs.  ABDOMEN:  Soft, nontender, nondistended.  Bowel sounds present.  GENITOURINARY:  Deferred.  EXTREMITIES:  Valgus deformity right knee.  She does come out full  extension with flexion back to 120.  SKIN:  No cellulitis.  NEUROLOGIC:  Intact  distal sensibilities.   LABORATORY DATA:  Labs, EKG, chest x-ray all pending presurgical  testing.   IMPRESSION:  Right knee osteoarthritis.   PLAN OF ACTION:  Right total knee replacement by surgeon Dr. [name], January 18, 2008, at [facility].  Risks and complications  were discussed.   PLAN:  Disposition will be home health care versus SNF placement.  Placement will be determined postoperatively depending upon progress.     ______________________________  [name], [place]      Madlyn Frankel. Charlann Boxer, M.D.  Electronically Signed    BLM/MEDQ  D:  01/11/2008  T:  01/12/2008  Job:  [id number]

## 2011-04-05 NOTE — H&P (Signed)
[name], [name]                 ACCOUNT NO.:  [id number]   MEDICAL RECORD NO.:  [id number]          PATIENT TYPE:  INP   LOCATION:  1613                         FACILITY:  [facility]   PHYSICIAN:  Erasmo Leventhal, M.D.DATE OF BIRTH:  1927-12-04   DATE OF ADMISSION:  08/08/2005  DATE OF DISCHARGE:                                HISTORY & PHYSICAL   CHIEF COMPLAINT:  Left subcapital hip fracture.   HISTORY OF PRESENT ILLNESS:  This is a 77 year old lady with a history of a  fall 4 weeks ago. She had pain in her left leg, was seen at the hospital  emergency room where she was evaluated, had some hip pain. She had a plain x-  ray which showed no fracture and a CT scan which showed no fracture.  Subsequently, she was released home. She was allowed to weight-bear as  tolerated with a cane and walker but has continued to have pain over the  last several weeks that has not gotten better. She has been doing exercises  and getting into the hot tub but continued to have pain and came in today  for her routine checkup. Today, her pain was in the distal femur and knee  region with some pain radiating up towards the hip but no groin pain.  However, on range of motion of the hip she did have decreased hip range of  motion and did have pain in the lateral hip region with range of motion. X-  rays of her hip were repeated today and did show a probable subcapital  fracture of her hip. She was sent for a CT scan which did document the  impacted subcapital hip fracture. At this time the patient is to be admitted  to the hospital for medical clearance and then subsequent either pinning of  her fracture or possible hip arthroplasty. She did have some arthritic  changes noted on her CT scan and Dr. Thomasena Edis and [name] will discuss this  and determine which route would be better for the patient. We have talked to  Dr. Jarold Motto, her medical doctor. He or one of his partners will see her  tonight for  medical clearance and surgery is anticipated for tomorrow.   PAST MEDICAL HISTORY:  Drug allergies:  None. Current medications: Osteo Bi-  Flex, Tylenol, and calcium. Previous surgeries include knee arthroscopy,  appendectomy, and a lipoma excision from the posterior cervical spine.  Serious medical illnesses include osteoarthritis.   FAMILY HISTORY:  Positive for diabetes, hypertension, and coronary artery  disease.   SOCIAL HISTORY:  The patient is retired. She lives at home with her husband.  She does not smoke or drink.   REVIEW OF SYSTEMS:  CENTRAL NERVOUS SYSTEM:  Negative for headache, blurred  vision, or dizziness. PULMONARY:  Negative for shortness of breath, PND, or  orthopnea. CARDIOVASCULAR:  Negative for chest pain or palpitations. GI:  Negative for ulcers, hepatitis. GU:  Negative for urinary tract difficulty.  MUSCULOSKELETAL:  Positive as in the HPI.   PHYSICAL EXAMINATION:  VITAL SIGNS:  Blood pressure 180/100, respirations  16, pulse 74 and regular.  GENERAL APPEARANCE:  This is a well-developed, well-nourished lady in no  acute distress.  HEENT:  Head normocephalic. Nose patent, ears patent. Pupils equal, round,  and reactive to light. Throat without injection.  NECK:  Supple without adenopathy. Carotids 2+ without bruit.  CHEST:  Clear to auscultation. No rales or rhonchi. Respirations 16.  HEART:  Regular rate and rhythm at 74 beats per minute without murmur.  ABDOMEN:  Soft with active bowel sounds, no masses or organomegaly.  NEUROLOGIC: The patient alert and oriented to time, place, and person.  Cranial nerves II-XII grossly intact.  EXTREMITIES:  Within normal limits with the exception of the left leg which  shows severe osteoarthritis in the left knee with a valgus deformity. Her  left hip has decreased range of motion with pain to the range of motion.  Sensation and circulation are intact. Dorsalis pedis and posterior tibialis  pulses are 2+. Motor  strength 5/5 with the exception of the hip girdle,  which is limited secondary to pain.   X-rays of her hip show an impacted subcapital left hip fracture and CT scan  documents this, as well as some osteoarthritic change at the hip joint.   IMPRESSION:  Subcapital fracture, left hip.   PLAN:  Admit, medical clearance, and hip pinning versus hemiarthroplasty  versus total hip arthroplasty of the left hip.      [name]. [name], P.A.    ______________________________  Erasmo Leventhal, M.D.    SJC/MEDQ  D:  08/08/2005  T:  08/09/2005  Job:  [id number]

## 2011-04-05 NOTE — Op Note (Signed)
[name], [name]                 ACCOUNT NO.:  [id number]   MEDICAL RECORD NO.:  [id number]          PATIENT TYPE:  INP   LOCATION:  1613                         FACILITY:  [facility]   PHYSICIAN:  Madlyn Frankel. Charlann Boxer, M.D.  DATE OF BIRTH:  15-Dec-1927   DATE OF PROCEDURE:  08/09/2005  DATE OF DISCHARGE:                                 OPERATIVE REPORT   PREOPERATIVE DIAGNOSIS:  Left femoral neck fracture with associated  arthritic changes.   POSTOPERATIVE DIAGNOSIS:  Left femoral neck fracture with associated  arthritic changes.   PROCEDURE:  Left total hip replacement.   COMPONENT USED:  A DePuy system with a 52 Pinnacle cup, 36 metal neutral  liner, a femoral S-ROM 18 x 13, 36 standard neck with a 36 plus 0 ball, used  an 18D small sleeve.   SURGEON:  [name]   ASSISTANT:  Cherly Beach, PA-C   ANESTHESIA:  General.   BLOOD LOSS:  500.   DRAINS:  One.   COMPLICATIONS:  None.   INDICATION FOR PROCEDURE:  [name] is a pleasant 77 year old female, who  is a patient of Dr. Hayden Rasmussen, one of my partners.  She was actually seen  in the office with continued and persistent discomfort in her left lower  extremity.  She has a known history of left knee osteoarthritis.  She had  had a fall in the past.  This has apparently been worked up with radiographs  but no fracture identified or revealed at that point.  Further examination  in their office on the day prior to surgery revealed a valgus-type femoral  neck fracture.  CT scan was ordered which revealed cystic changes within the  femur and acetabulum.  There was also concern about the age of the fracture  and the delayed avascular changes versus synovial fluid infiltration  resulting in eventual nonunion.  The patient was admitted to the hospital.  Dr. Valma Cava discussed the case with me as well as with the patient,  discussing risks and benefits of screw fixation versus total hip  replacement.  Based on her age,  activity level, and health, the risks of  screw fixation and potential failure, avascular necrosis, hardware failure,  or nonunion were at this point outweighed by the benefits of a total hip  replacement.  The risks and benefits were reviewed including dislocation,  component failure.  Consent was obtained for total hip replacement.   PROCEDURE IN DETAIL:  The patient was brought to the operative theatre.  Once adequate anesthesia and preoperative antibiotics, 1 g of Ancef, were  administered, the patient was positioned in the right lateral decubitus  position with the left side up.  The left lower extremity was then prepped  and draped in a sterile fashion.  A lateral-based incision was made for a  posterior approach to the hip.  Short external rotators were taken down  separate from the posterior capsular which was then saved for later repair.  The femoral neck fracture was identified and noted to be comminuted and in  no continuity with the neck shaft.  A neck osteotomy was made based for the  S-ROM femoral component based off the tip of the trochanter.  At this point,  the acetabular exposure was obtained with retraction and protection of the  sciatic nerve using the posterior capsule.  Acetabular exposure was carried  out, and the patient was noted to have some acetabular changes, particularly  with the denuded and almost absent labrum posterior and superior.  Following  this exposure, reaming commenced with a 45 reamer and was carried up  sequentially.  I did ream.  I felt that the early reamings were rather  patent and did ream up to a 51 reamer.  Though her posterior wall was a  little bit thin, and her posterior column and anterior columns were  perfectly intact.  A final 52 Pinnacle cup was then impacted with good bony  coverage and initial scratch bit.  A single iliac screw was placed, and the  size 30 trial liner was placed, and attention was directed to the femur.  Please  note that the acetabular component was positioned beneath the  anterior column in about 15 degrees of forward flexion and about 35-40  degrees of abduction.  Femoral preparation was carried out per protocol for the S-ROM system.  Following a neck osteotomy, initial reaming was carried out.  It was then  carried up to a 13.5 three-quarters of the way down.  This had good  purchase, particularly at the anterior bed.  The proximal reaming was  carried out to 18D with good purchase and then milled to a D small.  Trial  liner was placed.  I then freshened up the neck cut.  Note that this was all  based off the tip of the trochanter.  A trial reduction was initially  carried out with 36 plus 8 offset stem except for this seem to have  lateralized her femur too much and provided too much tension and was changed  to a 36 standard neck.  Her leg lengths appeared equal on the operative  table in the lateral position based on a down position.  Her hip range of  motion was excellent without any concern for impingement anterior or  posterior with flexion, internal rotation, and external rotation and  extension, respectively.  She tolerated the sleeve position well.  She had 1  mm shuck.  Given this, the final components were brought to the field.  A 36  neutral metal liner was then impacted onto a cleaned and dried acetabular  shell without complication.  The final 18D small sleeve was impacted  followed by placement of the 18, 13, 36 standard stem.  She had native 20  degrees of anteversion, and I matched this, did not add any extra  anteversion.  A 36 zero ball was then impacted on cleaned and dried  trunnion.  The hip was reduced.  The combined anteversion was 45 degrees.  Again, the range of motion was stable within 1 mm shuck.  Based on this, the  hip was irrigated as it had been throughout the case.  The posterior capsule was reapproximated to the superior leaflet.  The short external rotators   were not able to be repaired and were left free.  The medium Hemovac drain  was placed deep.  The iliotibial band was reapproximated using a #1  Ethibond.  I did do an anterior release at the iliotibial band to provide  some relaxation of these tissues.  The gluteus maximus fascia was  reapproximated using #1 running Vicryl.  The remainder of the wound was  closed in layers including a 4-0 Monocryl.  The hip was then cleaned, dried,  and dressed sterilely.  Steri-Strips were applied.  The patient was then  transferred to the recovery room where she was extubated in stable  condition.      Madlyn Frankel Charlann Boxer, M.D.  Electronically Signed     MDO/MEDQ  D:  08/09/2005  T:  08/10/2005  Job:  [id number]

## 2011-04-05 NOTE — Discharge Summary (Signed)
[name], [name]                 ACCOUNT NO.:  [id number]   MEDICAL RECORD NO.:  [id number]          PATIENT TYPE:  INP   LOCATION:  1619                         FACILITY:  [facility]   PHYSICIAN:  Madlyn Frankel. Charlann Boxer, M.D.  DATE OF BIRTH:  [birth year]   DATE OF ADMISSION:  01/18/2008  DATE OF DISCHARGE:  01/21/2008                               DISCHARGE SUMMARY   ADMISSION DIAGNOSIS:  Osteoarthritis.   DISCHARGE DIAGNOSIS:  Osteoarthritis.   HISTORY OF PRESENT ILLNESS:  This is a 77 year old female history with  right knee pain secondary to osteoarthritis refractory to all  conservative treatments.  Presurgical assessment was by her primary care  physician, Dr. Ivery Quale.   LABS:  1. Preadmission CBC:  Hemoglobin/hematocrit 14 and 40.7.  Her      platelets were 310,000.  At discharge:  Hemoglobin 10.5, hematocrit      29.6, platelets 245, white cell count normal.  Coagulation      preadmission normal.  INR was 1.  PT 13.6.  Routine chemistry on      admission:  Sodium 145, potassium 4, glucose 160, creatinine 0.6.      At discharge:  Sodium 138, potassium 3.9, glucose 185, creatinine      0.51.  Kidney function normal.  Her calcium was 8.5 at discharge.      UA showed moderate leukocyte esterase, many epithelials.  2. Cardiology EKG:  Abnormal ECG as it shows incomplete right bundle      branch block.  3. No No chest x-ray fell can be found on chart.   HOSPITAL COURSE:  The patient underwent right total knee replacement and  tolerated the procedure well and was admitted to the orthopedic floor.  She was seen on day #1 and her leg felt a little heavy; however, no  significant events.  She did remain hemodynamically stable throughout  the course of stay.  The dressing was changed on a daily basis after  postop day #1.  There was no significant drainage from wound.  The wound  looked great without infection.  She was neurovascularly intact on her  right lower extremity throughout  her course, and by her third day, she  was able to do straight leg raise.  She was weightbearing as tolerated.  We started her on Lovenox blood thinners.  She progressed nicely with  physical therapy and was able to ambulate up to 200 feet prior to  discharge.  She was seen on the third day and was doing great, good  progress.  Dressing was changed and wound looked good.  Weightbearing  was as tolerated and she was ready for discharge home.   DISCHARGE DISPOSITION:  Stable, improved condition.   DISCHARGE DIET:  Regular.   DISCHARGE WOUND CARE:  Keep dry change dressing daily.   DISCHARGE PHYSICAL THERAPY:  Weightbearing as tolerated with the use of  rolling walker.   DISCHARGE MEDICATIONS:  1. Lovenox 40 mg subcu q.24h. x11 days.  2. Robaxin 500 mg p.o. q.6 h.  3. Iron 325 mg p.o. t.i.d.  4. Aspirin 325 mg p.o.  daily after Lovenox.  5. Colace 5 mg p.o. b.i.d.  6. MiraLax 17 g p.o. daily.  7. Vicodin 5/325, 1-2 p.o. q.4-6 h. p.r.n. pain.   HOME MEDICATIONS:  1. Calcium plus D daily.  2. Prilosec over-the-counter p.r.n..   DISCHARGE FOLLOWUP:  Follow up with [name] at phone number [phone] in  10-14 days.     ______________________________  [name], [place]      Madlyn Frankel. Charlann Boxer, M.D.  Electronically Signed    BLM/MEDQ  D:  03/08/2008  T:  03/08/2008  Job:  [id number]   cc:   Ivery Quale, MD

## 2011-04-05 NOTE — Consult Note (Signed)
[name], [name]                 ACCOUNT NO.:  [id number]   MEDICAL RECORD NO.:  [id number]          PATIENT TYPE:  INP   LOCATION:  1613                         FACILITY:  [facility]   PHYSICIAN:  Barry Dienes. Eloise Harman, M.D.DATE OF BIRTH:  [birth year]   DATE OF CONSULTATION:  08/08/2005  DATE OF DISCHARGE:                                   CONSULTATION   REQUESTING PHYSICIAN:  Erasmo Leventhal, M.D.   INDICATIONS:  Preop evaluation.   HISTORY OF PRESENT ILLNESS:  The patient is a 77 year old white female who  on August 22 stumbled and hit her left hip. She had a significant amount of  pain and was evaluated in the emergency room with x-rays and a CT scan of  the left hip, it did not show a fracture. She was started on nonsteroidal  antiinflammatory drug treatment along with Vicodin that was not every  effective. She was seen by her orthopedist approximately 2 weeks ago where x-  rays reportedly were unremarkable. Her pain persisted and gradually worsened  so she was seen for followup evaluation today. A CT scan done at [facility] reportedly shows an impacted fracture of the left femoral head and  she is admitted for surgical treatment. She has pain with any weightbearing  in the left hip that is moderately severe. At rest, she is not having pain.  Her review of systems is significant for the lack of shortness of breath or  chest pain or fever or chills. Lately she has had significant difficulty  getting restful sleep due to pain in the left hip.   PAST MEDICAL HISTORY:  Significant for osteoarthritis of the cervical spine,  hands and left knee, hypertension, urge incontinence, atypical chest pain  that lead to a 2002 adenosine Cardiolite test that was unremarkable with a  normal left ventricular ejection fraction. Dyslipidemia, left shoulder pain  with abduction in August 2006 left ovarian cyst noted on CAT scan that was  evaluated with ultrasound revealing a simple cyst. She  also has osteopenia  noted on bone density test in 1998 with a bone mineral density of -1.7.   MEDICATIONS PRIOR TO ADMISSION:  1.  Os-Cal D 500 t.i.d.  2.  Osteo Bi-flex as needed.  3.  Vicodin 5/500, 1 tab p.o. t.i.d. p.r.n. pain.  4.  Senna-S, 1-2 tabs daily p.r.n. constipation.  5.  Lipitor 10 mg p.o. q.o.d.   ALLERGIES:  No known drug allergies.   PAST SURGICAL HISTORY:  1.  1983, sebaceous cyst removal from the back.  2.  1995, left knee arthroscopy.   SOCIAL HISTORY:  She is married and lives [place] farm [place].  She has no history of tobacco or alcohol abuse. She has 3 children.   FAMILY HISTORY:  Remarkable for a son and sister who had diabetes mellitus,  type 2. There are no close family relatives with colon cancer, breast  cancer, or premature cardiovascular disease.   REVIEW OF SYMPTOMS:  Significant for mild constipation and chronic left knee  pain. She denies recent fever, chills, shortness of  breath, chest pain,  production cough, dysuria, frequency, anxiety, or depression.   PHYSICAL EXAMINATION:  VITAL SIGNS:  Blood pressure 200/86, pulse 77,  respirations 20, temperature 99.1, pulse oxygen saturation 95% on room air.  GENERAL:  She is a well-nourished, well-developed, white female who is in  apparent distress while lying semiupright in bed.  HEENT:  Within normal limits.  NECK:  Supple and without jugular venous distention or carotid bruit.  CHEST:  Clear to auscultation.  HEART:  Regular rate and rhythm without murmur or gallop.  ABDOMEN:  Normal bowel sounds with no hepatosplenomegaly or tenderness.  EXTREMITIES:  Without cyanosis, clubbing or edema and the pedal pulses were  normal.  NEUROLOGIC:  Nonfocal.   LABORATORY DATA:  White blood cell count 10.6, hemoglobin 14, hematocrit 42,  platelet 259. Serum sodium 141, potassium 4.1, chloride 105, CO2 28, BUN 17,  creatinine 0.7, glucose 189. Serum albumin 3.5. A July 19, 2005 pelvic   ultrasound showed a simple left ovarian cyst measuring 5.1 cm x 4.1 cm x 3.5  cm.   Today's EKG showed:  1.  Normal sinus rhythm.  2.  Occasional PVC's and fusion beats.  3.  No acute abnormalities in comparison with an office EKG from October 06, 2001.   IMPRESSION:  1.  Preoperative risk assessment:  She has no worrisome findings that would      increase her perioperative risks. She is at low perioperative risk for a      cardiac morbidity or mortality.  2.  Hypertension:  Her blood pressure is moderately elevated due to her pain      and insomnia. Tonight, given her n.p.o. status, I plan to give her      morphine as needed for her pain. In addition, low dose beta blocker      treatment will be initiated.  3.  Insomnia:  Due to hip pain. Per above, the hip pain will be addressed      and she will be given Restoril.           ______________________________  Barry Dienes. Eloise Harman, M.D.     DGP/MEDQ  D:  08/08/2005  T:  08/09/2005  Job:  [id number]   cc:   Georga Hacking, M.D.  Fax: [phone]   Bernette Redbird, M.D.  Fax: [phone]

## 2011-04-05 NOTE — Discharge Summary (Signed)
[name], [name]                 ACCOUNT NO.:  [id number]   MEDICAL RECORD NO.:  [id number]          PATIENT TYPE:  INP   LOCATION:  1606                         FACILITY:  [facility]   PHYSICIAN:  Madlyn Frankel. Charlann Boxer, M.D.  DATE OF BIRTH:  26-May-1928   DATE OF ADMISSION:  01/24/2009  DATE OF DISCHARGE:  01/26/2009                               DISCHARGE SUMMARY   ADMISSION DIAGNOSES:  1. Osteoarthritis.  2. Hiatal hernia.   DISCHARGE DIAGNOSES:  1. Osteoarthritis.  2. Hiatal hernia.   HISTORY OF PRESENT ILLNESS:  A 77 year old female with a history of left  knee pain secondary to osteoarthritis refractory to all conservative  treatment.   CONSULTATION:  None.   PROCEDURE:  Left total knee replacement by surgeon, Dr. [name],  assistant Yetta Glassman. Mann, PA-C.   LABORATORY DATA:  CBC:  Final reading white blood cell 11.8, hemoglobin  10.4, hematocrit 30.4, platelets 201,000.  Metabolic panel shows sodium  139, potassium 3.9, glucose 180, BUN 10, creatinine 0.61.  Calcium was  8.3.  Urinalysis preoperatively showed rare bacteria.  Checked in the  hospital.  There was a trace of ketones, trace of leukocyte esterase.  EKG sinus bradycardia.  Chest two-view:  No active lung disease.   HOSPITAL COURSE:  The patient underwent a left total knee replacement  and admitted to the orthopedic floor.  She remained hemodynamically  stable.  Orthopedically she remained stable throughout.  She did have a  little bit of an overactive bladder.  She was given some Detrol.  This  helped relieve the symptoms.  She had some significant ankle pain.  This  was related to significant gait disturbance prior to surgery.  Overall,  though, she did well after the knee replacement and by day #2 had met  all criteria for discharge home with home health care PT selected.   DISCHARGE DISPOSITION:  Discharge home in stable improved condition.   ACTIVITY:  Physical therapy, weightbearing as tolerated with  the use  rolling walker.   DISCHARGE DIET:  Heart-healthy.   WOUND CARE:  Keep dry.   DISCHARGE MEDICATIONS:  1. Aspirin 325 mg one p.o. b.i.d. times 6 weeks.  2. Robaxin 500 mg one p.o. q.6h..  3. Iron 325 mg one p.o. t.i.d.  4. Colace 100 mg p.o. b.i.d.  5. MiraLax 17 grams p.o. daily.  6. Norco 7.5/325 mg one to two p.o. q.4-6h.  7. Detrol 10 mg long-acting p.o. daily p.r.n.  8. Aleve over-the-counter p.r.n.  9. Osteobiflex daily.  10.Tylenol p.r.n.  11.Prilosec 40 mg p.o. daily.   DISCHARGE FOLLOWUP:  Follow up with [name] at phone number [phone] in  two weeks for wound check.     ______________________________  Yetta Glassman. [address], [place]      Madlyn Frankel. Charlann Boxer, M.D.  Electronically Signed    BLM/MEDQ  D:  02/09/2009  T:  02/09/2009  Job:  [id number]

## 2011-04-05 NOTE — Discharge Summary (Signed)
[name], [name]                 ACCOUNT NO.:  [id number]   MEDICAL RECORD NO.:  [id number]          PATIENT TYPE:  INP   LOCATION:  1613                         FACILITY:  [facility]   PHYSICIAN:  Madlyn Frankel. Charlann Boxer, M.D.  DATE OF BIRTH:  Jun 11, 1928   DATE OF ADMISSION:  08/08/2005  DATE OF DISCHARGE:  08/13/2005                                 DISCHARGE SUMMARY   ADMISSION DIAGNOSIS:  Subcapital fracture of the left hip.   DISCHARGE DIAGNOSES:  1.  Subcapital fracture of the left hip.  2.  Mild postoperative anemia.   OPERATION:  On August 09, 2005, the patient underwent left total hip  replacement arthroplasty using the DePuy system with a 52 pinnacle cup,  metal neutral liner and standard neck.  [name] assisted.   CONSULTATIONS:  Dr. Jarold Motto, et al, Internal Medicine.   BRIEF HISTORY:  This 77 year old lady who is a patient of Dr. Valma Cava  of [facility] was seen in the office for hip pain.  She had  persistent discomfort into her left lower extremity.  She had a fall in the  past, but no fracture identified after that fall.  X-rays in the office on  the day prior to this surgery.  X-rays revealed a valgus-type femoral neck  fracture.  CT scan showed cystic changes in the femur and acetabulum.  Dr.  Thomasena Edis admitted the patient.  Discussed with the patient the appropriate  surgery.  [name] was asked to see the patient for evaluation.  Due to the  possibility of nonunion and cystic changes, it was decided to go ahead with  a total hip replacement arthroplasty so that other surgical procedures and  all probability would not be indicated.  They have agreed to the above  procedure.   HOSPITAL COURSE:  The patient tolerated surgical procedure quite well.  She  was seen postoperatively by Dr. Jarold Motto who assisted [place] in managing her  hypertension.  Dr. Wylene Simmer also saw the patient postoperatively.  Had some  difficulty voiding postoperatively after  the Foley was removed, and we p.o.  hydrated the patient, and she eventually was able to void.  Constipation  secondary to narcotics, was treated with laxatives.  She developed an  increase in her glucose and Glucophage was started by Dr. Jarold Motto.   Orthopedically, the patient wanted to go home after her hospitalization.  We  therefore asked for [name] to provide home health, which they did.  Would  was dry.  Neurovascularly intact in the operative extremity.  Calf was soft.   The patient was begun on Coumadin protocol postoperatively for prevention of  DVT.   LABORATORY DATA:  CBC preoperatively completely within normal limits and a  very slightly elevated WBC at 10.6.  Final hemoglobin of 10.5, hematocrit  30.9.  Blood chemistries were essentially within normal limits, but as  indicated in the dictation above, Dr. Jarold Motto noticed the hyperglycemia of  189 treated with Glucophage and a final glucose of 159.  Urinalysis  essentially negative for urinary tract infection.  Electrocardiogram showed  sinus rhythm with  occasional premature ventricular complexes and effusion  complex as well.  Chest x-ray showed no active disease.  Postoperative films  showed satisfactory pelvis status post left hemiarthroplasty.   CONDITION ON DISCHARGE:  Improved, stable.   PLAN:  The patient is discharged to her home in care of her family with home  health with [place].  She is to continue with home medications and diet.  Should there be any questions concerning her home medications as well as the  fact that she would need to call follow up with Dr. Brunilda Payor per his  instructions.  Return to see [name] in about two weeks after the day of  surgery.  Use dry dressing p.r.n. and continue with partial weightbearing as  tolerated.   HOME PRESCRIPTIONS FROM OUR SERVICE:  1.  Vicodin for discomfort.  2.  Robaxin 500 mg for muscle spasm.  3.  Coumadin per pharmacy protocol.      Dooley L.  Cherlynn June.      Madlyn Frankel Charlann Boxer, M.D.  Electronically Signed    DLU/MEDQ  D:  08/29/2005  T:  08/29/2005  Job:  [id number]   cc:   Barry Dienes. Eloise Harman, M.D.  Fax: [phone]

## 2011-04-05 NOTE — Op Note (Signed)
   NAMESHANTAVIA, JHA                           ACCOUNT NO.:  [id number]   MEDICAL RECORD NO.:  [id number]                   PATIENT TYPE:  AMB   LOCATION:  ENDO                                 FACILITY:  [facility]   PHYSICIAN:  Bernette Redbird, M.D.                DATE OF BIRTH:  1928/11/08   DATE OF PROCEDURE:  04/05/2003  DATE OF DISCHARGE:                                 OPERATIVE REPORT   PROCEDURE PERFORMED:  Colonoscopy with biopsies.   ENDOSCOPIST:  Florencia Reasons, M.D.   INDICATIONS FOR PROCEDURE:  The patient is a 77 year old female for colon  cancer screening.  No worrisome symptoms.   FINDINGS:  Tiny ulcerations in the terminal ileum, presumed secondary to  NSAID exposure.   DESCRIPTION OF PROCEDURE:  The nature, purpose and risks of the procedure  had been discussed with the patient, who provided written consent.  Sedation  was fentanyl 35 mcg and Versed 3 mg IV without arrhythmias or desaturation.   The Olympus adjustable tension pediatric video colonoscope was advanced to  the terminal ileum without significant difficulty and pullback was then  performed.  In the terminal ileum there were four or five tiny (2 mm)  shallow ulcerations with the intervening mucosa having a generally normal  appearance.  There were no deep serpiginous ulcerations.  A couple of  biopsies were obtained form these ulcerated areas prior to pullback of the  scope.   Pullback was then performed around the colon.  The quality of the prep was  excellent and it is felt that all areas were well seen.  The colon was  normal; no polyps, cancer, colitis, vascular malformations or diverticulosis  were noted.  In retrospection the rectum as well as reinspection of the  sigmoid was unremarkable.   The patient tolerated the procedure well and there were no apparent  complications.   IMPRESSION:  Normal screening colonoscopy except for tiny ileal ulcerations  which are felt to probably be secondary  to the patient's daily use of Mobic.    PLAN:  Await pathology results.  Anticipate screening flexible sigmoidoscopy  in five years for continued colon cancer screening.                                                Bernette Redbird, M.D.    RB/MEDQ  D:  04/05/2003  T:  04/05/2003  Job:  [id number]   cc:   Barry Dienes. Eloise Harman, M.D.  [address]  [place]  [place] [address]  Fax: [phone]

## 2011-05-29 ENCOUNTER — Other Ambulatory Visit (HOSPITAL_BASED_OUTPATIENT_CLINIC_OR_DEPARTMENT_OTHER): Payer: Self-pay | Admitting: Internal Medicine

## 2011-06-05 ENCOUNTER — Ambulatory Visit (HOSPITAL_COMMUNITY)
Admission: RE | Admit: 2011-06-05 | Discharge: 2011-06-05 | Disposition: A | Discharge status: Home / Self Care | Payer: Medicare Other | Source: Ambulatory Visit | Attending: Internal Medicine | Admitting: Internal Medicine

## 2011-06-05 DIAGNOSIS — R1310 Dysphagia, unspecified: Secondary | ICD-10-CM | POA: Insufficient documentation

## 2011-06-05 DIAGNOSIS — K224 Dyskinesia of esophagus: Secondary | ICD-10-CM | POA: Insufficient documentation

## 2011-06-05 DIAGNOSIS — K449 Diaphragmatic hernia without obstruction or gangrene: Secondary | ICD-10-CM | POA: Insufficient documentation

## 2011-06-05 DIAGNOSIS — R222 Localized swelling, mass and lump, trunk: Secondary | ICD-10-CM | POA: Insufficient documentation

## 2011-06-05 DIAGNOSIS — R0789 Other chest pain: Secondary | ICD-10-CM | POA: Insufficient documentation

## 2011-06-27 ENCOUNTER — Other Ambulatory Visit: Payer: Self-pay | Admitting: Licensed Clinical Social Worker

## 2011-06-27 DIAGNOSIS — I10 Essential (primary) hypertension: Secondary | ICD-10-CM

## 2011-06-27 MED ORDER — AMLODIPINE BESYLATE 5 MG PO TABS: 5.0000 mg | ORAL_TABLET | Freq: Every day | ORAL | Status: DC

## 2011-08-09 LAB — CBC
MCHC: 34.3
MCV: 88.9
RBC: 4.58

## 2011-08-09 LAB — BASIC METABOLIC PANEL
CO2: 28
Chloride: 109
Creatinine, Ser: 0.6
GFR calc Af Amer: >60
Glucose, Bld: 160 — ABNORMAL HIGH

## 2011-08-09 LAB — URINALYSIS, ROUTINE W REFLEX MICROSCOPIC
Ketones, ur: NEGATIVE
Nitrite: NEGATIVE
Specific Gravity, Urine: 1.027
Urobilinogen, UA: 0.2
pH: 5.5

## 2011-08-09 LAB — URINE MICROSCOPIC-ADD ON

## 2011-08-09 LAB — DIFFERENTIAL
Basophils Relative: 0
Eosinophils Absolute: 0.1
Monocytes Absolute: 0.5
Monocytes Relative: 7
Neutrophils Relative %: 77

## 2011-08-09 LAB — PROTIME-INR: INR: 1

## 2011-08-12 LAB — BASIC METABOLIC PANEL
BUN: 6
Chloride: 105
GFR calc non Af Amer: >60
Glucose, Bld: 185 — ABNORMAL HIGH
Potassium: 3.9
Potassium: 4.2
Sodium: 139

## 2011-08-12 LAB — TYPE AND SCREEN

## 2011-08-12 LAB — CBC
HCT: 29.6 — ABNORMAL LOW
HCT: 30 — ABNORMAL LOW
Hemoglobin: 10.4 — ABNORMAL LOW
MCV: 87.3
Platelets: 245
RBC: 3.41 — ABNORMAL LOW
RDW: 13.6
WBC: 9.6

## 2011-08-12 LAB — ABO/RH: ABO/RH(D): O NEG

## 2011-10-01 ENCOUNTER — Other Ambulatory Visit (HOSPITAL_COMMUNITY): Payer: Self-pay | Admitting: Internal Medicine

## 2011-10-03 ENCOUNTER — Ambulatory Visit (HOSPITAL_COMMUNITY)
Admission: RE | Admit: 2011-10-03 | Discharge: 2011-10-03 | Disposition: A | Discharge status: Home / Self Care | Payer: Medicare Other | Source: Ambulatory Visit | Attending: Internal Medicine | Admitting: Internal Medicine

## 2011-10-03 DIAGNOSIS — R1310 Dysphagia, unspecified: Secondary | ICD-10-CM | POA: Insufficient documentation

## 2011-10-03 NOTE — Procedures (Signed)
Modified Barium Swallow Procedure Note Patient Details  Name: [name] MRN: [id number] Date of Birth: 07-11-1928  Today's Date: 10/03/2011 Time:  -     Past Medical History:    Patient complains of heartburn, vomiting during meals intermittently.  Esophagram results of 06/05/11:  Small Hiatal Hernia,esophageal dysmotility, no aspirations, but Radiologist recommended MBS.  SLP phone Dr. Norval Gable office to confirm order.  RN confirmed MD requested a MBS secondary to concern of aspiration.  Recommendation/Prognosis  Clinical Impression Dysphagia Diagnosis: Suspected primary esophageal dysphagia Clinical impression: Normal oropharyngeal swallow function with all consistencies presented.  The 12.30mm barium tablet remained at the LES and did not clear with consecutive swallows of thin liquid.  Terrtiary contractions were observed and the liquid gradually moved around the pill into the stomach.  When a bite of applesauce was given the pill moved through the LES.  No backflow or reflux was observed, but certainly may occur at times.  No aspiration was observed.l  Recommendations Recommended Consults: Consider GI evaluation Solid Consistency: Regular Liquid Consistency: Thin  Liquid Administration via: Cup Medication Administration: Whole meds with puree Supervision: Patient able to self feed Compensations: Slow rate;Follow solids with liquid;Small sips/bites Postural Changes and/or Swallow Maneuvers: Seated upright 90 degrees;Upright 30-60 min after meal  Individuals Consulted Consulted and Agree with Results and Recommendations: Patient Report Sent to : Referring physician          General:  Date of Onset: 06/02/11 Other Pertinent Information: Patient reports having frequent (1-2 times per week)  Type of Study: Initial MBS Diet Prior to this Study: Regular;Thin liquids Temperature Spikes Noted: No Respiratory Status: Room air History of Intubation: No Behavior/Cognition:  Alert;Cooperative;Pleasant mood Oral Cavity - Dentition: Adequate natural dentition Oral Motor / Sensory Function: Within functional limits Patient Positioning: Upright in chair Baseline Vocal Quality: Normal Volitional Cough: Strong Volitional Swallow: Able to elicit Anatomy: Within functional limits  Reason for Referral: Question aspiration Oral Phase Oral Preparation/Oral Phase Oral Phase: WFL Pharyngeal Phase  Pharyngeal Phase Pharyngeal Phase: Within functional limits Cervical Esophageal Phase  Cervical Esophageal Phase Cervical Esophageal Phase: [facility] Question lower esophageal dysmotility Question LES function    Maryjo Rochester T 10/03/2011, 12:24 PM

## 2011-10-14 ENCOUNTER — Other Ambulatory Visit (HOSPITAL_COMMUNITY): Payer: Self-pay | Admitting: Gastroenterology

## 2011-10-16 ENCOUNTER — Ambulatory Visit (HOSPITAL_COMMUNITY)
Admission: RE | Admit: 2011-10-16 | Discharge: 2011-10-16 | Disposition: A | Discharge status: Home / Self Care | Payer: Medicare Other | Source: Ambulatory Visit | Attending: Gastroenterology | Admitting: Gastroenterology

## 2011-10-16 DIAGNOSIS — K449 Diaphragmatic hernia without obstruction or gangrene: Secondary | ICD-10-CM | POA: Insufficient documentation

## 2011-10-16 DIAGNOSIS — R1310 Dysphagia, unspecified: Secondary | ICD-10-CM | POA: Insufficient documentation

## 2011-10-24 ENCOUNTER — Other Ambulatory Visit: Payer: Self-pay | Admitting: *Deleted

## 2011-10-24 NOTE — Telephone Encounter (Signed)
rec'd a fax from [facility] on [address] asking for amlodipine refill. I called & spoke with pharmacist. He will re-send it to [facility] outpt clinic to her pcp

## 2011-11-21 DIAGNOSIS — M205X9 Other deformities of toe(s) (acquired), unspecified foot: Secondary | ICD-10-CM | POA: Diagnosis not present

## 2011-12-03 DIAGNOSIS — M25519 Pain in unspecified shoulder: Secondary | ICD-10-CM | POA: Diagnosis not present

## 2011-12-03 DIAGNOSIS — M205X9 Other deformities of toe(s) (acquired), unspecified foot: Secondary | ICD-10-CM | POA: Diagnosis not present

## 2011-12-04 DIAGNOSIS — M25519 Pain in unspecified shoulder: Secondary | ICD-10-CM | POA: Diagnosis not present

## 2011-12-04 DIAGNOSIS — M205X9 Other deformities of toe(s) (acquired), unspecified foot: Secondary | ICD-10-CM | POA: Diagnosis not present

## 2011-12-09 DIAGNOSIS — S0990XA Unspecified injury of head, initial encounter: Secondary | ICD-10-CM | POA: Diagnosis not present

## 2011-12-09 DIAGNOSIS — Z23 Encounter for immunization: Secondary | ICD-10-CM | POA: Diagnosis not present

## 2011-12-09 DIAGNOSIS — S61409A Unspecified open wound of unspecified hand, initial encounter: Secondary | ICD-10-CM | POA: Diagnosis not present

## 2011-12-17 DIAGNOSIS — S0993XA Unspecified injury of face, initial encounter: Secondary | ICD-10-CM | POA: Diagnosis not present

## 2011-12-17 DIAGNOSIS — S199XXA Unspecified injury of neck, initial encounter: Secondary | ICD-10-CM | POA: Diagnosis not present

## 2011-12-17 DIAGNOSIS — Z9181 History of falling: Secondary | ICD-10-CM | POA: Diagnosis not present

## 2011-12-17 DIAGNOSIS — S0003XA Contusion of scalp, initial encounter: Secondary | ICD-10-CM | POA: Diagnosis not present

## 2011-12-17 DIAGNOSIS — R51 Headache: Secondary | ICD-10-CM | POA: Diagnosis not present

## 2011-12-17 DIAGNOSIS — S0083XA Contusion of other part of head, initial encounter: Secondary | ICD-10-CM | POA: Diagnosis not present

## 2011-12-17 DIAGNOSIS — Z7901 Long term (current) use of anticoagulants: Secondary | ICD-10-CM | POA: Diagnosis not present

## 2011-12-17 DIAGNOSIS — S43429A Sprain of unspecified rotator cuff capsule, initial encounter: Secondary | ICD-10-CM | POA: Diagnosis not present

## 2011-12-19 DIAGNOSIS — Z7901 Long term (current) use of anticoagulants: Secondary | ICD-10-CM | POA: Diagnosis not present

## 2011-12-19 DIAGNOSIS — T148XXA Other injury of unspecified body region, initial encounter: Secondary | ICD-10-CM | POA: Diagnosis not present

## 2011-12-19 DIAGNOSIS — I4891 Unspecified atrial fibrillation: Secondary | ICD-10-CM | POA: Diagnosis not present

## 2011-12-24 DIAGNOSIS — T148XXA Other injury of unspecified body region, initial encounter: Secondary | ICD-10-CM | POA: Diagnosis not present

## 2011-12-24 DIAGNOSIS — I4891 Unspecified atrial fibrillation: Secondary | ICD-10-CM | POA: Diagnosis not present

## 2011-12-24 DIAGNOSIS — Z7901 Long term (current) use of anticoagulants: Secondary | ICD-10-CM | POA: Diagnosis not present

## 2011-12-30 DIAGNOSIS — S1093XA Contusion of unspecified part of neck, initial encounter: Secondary | ICD-10-CM | POA: Diagnosis not present

## 2011-12-30 DIAGNOSIS — T148XXA Other injury of unspecified body region, initial encounter: Secondary | ICD-10-CM | POA: Diagnosis not present

## 2011-12-30 DIAGNOSIS — I4891 Unspecified atrial fibrillation: Secondary | ICD-10-CM | POA: Diagnosis not present

## 2011-12-30 DIAGNOSIS — S0003XA Contusion of scalp, initial encounter: Secondary | ICD-10-CM | POA: Diagnosis not present

## 2011-12-30 DIAGNOSIS — Z7901 Long term (current) use of anticoagulants: Secondary | ICD-10-CM | POA: Diagnosis not present

## 2012-01-07 DIAGNOSIS — R1310 Dysphagia, unspecified: Secondary | ICD-10-CM | POA: Diagnosis not present

## 2012-01-07 DIAGNOSIS — R12 Heartburn: Secondary | ICD-10-CM | POA: Diagnosis not present

## 2012-01-09 DIAGNOSIS — T148XXA Other injury of unspecified body region, initial encounter: Secondary | ICD-10-CM | POA: Diagnosis not present

## 2012-01-09 DIAGNOSIS — Z7901 Long term (current) use of anticoagulants: Secondary | ICD-10-CM | POA: Diagnosis not present

## 2012-01-09 DIAGNOSIS — I4891 Unspecified atrial fibrillation: Secondary | ICD-10-CM | POA: Diagnosis not present

## 2012-01-17 DIAGNOSIS — I4891 Unspecified atrial fibrillation: Secondary | ICD-10-CM | POA: Diagnosis not present

## 2012-01-17 DIAGNOSIS — Z7901 Long term (current) use of anticoagulants: Secondary | ICD-10-CM | POA: Diagnosis not present

## 2012-02-05 DIAGNOSIS — Z7901 Long term (current) use of anticoagulants: Secondary | ICD-10-CM | POA: Diagnosis not present

## 2012-02-05 DIAGNOSIS — R7309 Other abnormal glucose: Secondary | ICD-10-CM | POA: Diagnosis not present

## 2012-02-05 DIAGNOSIS — M4800 Spinal stenosis, site unspecified: Secondary | ICD-10-CM | POA: Diagnosis not present

## 2012-02-05 DIAGNOSIS — I10 Essential (primary) hypertension: Secondary | ICD-10-CM | POA: Diagnosis not present

## 2012-02-06 DIAGNOSIS — D235 Other benign neoplasm of skin of trunk: Secondary | ICD-10-CM | POA: Diagnosis not present

## 2012-02-06 DIAGNOSIS — C44319 Basal cell carcinoma of skin of other parts of face: Secondary | ICD-10-CM | POA: Diagnosis not present

## 2012-02-06 DIAGNOSIS — L820 Inflamed seborrheic keratosis: Secondary | ICD-10-CM | POA: Diagnosis not present

## 2012-02-07 DIAGNOSIS — I4891 Unspecified atrial fibrillation: Secondary | ICD-10-CM | POA: Diagnosis not present

## 2012-02-07 DIAGNOSIS — Z7901 Long term (current) use of anticoagulants: Secondary | ICD-10-CM | POA: Diagnosis not present

## 2012-02-13 DIAGNOSIS — M5137 Other intervertebral disc degeneration, lumbosacral region: Secondary | ICD-10-CM | POA: Diagnosis not present

## 2012-02-13 DIAGNOSIS — M5430 Sciatica, unspecified side: Secondary | ICD-10-CM | POA: Diagnosis not present

## 2012-02-26 DIAGNOSIS — M5137 Other intervertebral disc degeneration, lumbosacral region: Secondary | ICD-10-CM | POA: Diagnosis not present

## 2012-02-26 DIAGNOSIS — I4891 Unspecified atrial fibrillation: Secondary | ICD-10-CM | POA: Diagnosis not present

## 2012-02-26 DIAGNOSIS — Z7901 Long term (current) use of anticoagulants: Secondary | ICD-10-CM | POA: Diagnosis not present

## 2012-03-12 DIAGNOSIS — Z85820 Personal history of malignant melanoma of skin: Secondary | ICD-10-CM | POA: Diagnosis not present

## 2012-03-13 DIAGNOSIS — M5430 Sciatica, unspecified side: Secondary | ICD-10-CM | POA: Diagnosis not present

## 2012-05-29 DIAGNOSIS — L03039 Cellulitis of unspecified toe: Secondary | ICD-10-CM | POA: Diagnosis not present

## 2012-06-11 DIAGNOSIS — D235 Other benign neoplasm of skin of trunk: Secondary | ICD-10-CM | POA: Diagnosis not present

## 2012-06-11 DIAGNOSIS — Z85828 Personal history of other malignant neoplasm of skin: Secondary | ICD-10-CM | POA: Diagnosis not present

## 2012-06-16 DIAGNOSIS — Z7901 Long term (current) use of anticoagulants: Secondary | ICD-10-CM | POA: Diagnosis not present

## 2012-06-16 DIAGNOSIS — R197 Diarrhea, unspecified: Secondary | ICD-10-CM | POA: Diagnosis not present

## 2012-06-16 DIAGNOSIS — I4891 Unspecified atrial fibrillation: Secondary | ICD-10-CM | POA: Diagnosis not present

## 2012-06-23 DIAGNOSIS — Z803 Family history of malignant neoplasm of breast: Secondary | ICD-10-CM | POA: Diagnosis not present

## 2012-06-23 DIAGNOSIS — Z1231 Encounter for screening mammogram for malignant neoplasm of breast: Secondary | ICD-10-CM | POA: Diagnosis not present

## 2012-06-30 DIAGNOSIS — Z7901 Long term (current) use of anticoagulants: Secondary | ICD-10-CM | POA: Diagnosis not present

## 2012-06-30 DIAGNOSIS — I4891 Unspecified atrial fibrillation: Secondary | ICD-10-CM | POA: Diagnosis not present

## 2012-07-01 DIAGNOSIS — N6019 Diffuse cystic mastopathy of unspecified breast: Secondary | ICD-10-CM | POA: Diagnosis not present

## 2012-07-01 DIAGNOSIS — N63 Unspecified lump in unspecified breast: Secondary | ICD-10-CM | POA: Diagnosis not present

## 2012-07-06 DIAGNOSIS — Z961 Presence of intraocular lens: Secondary | ICD-10-CM | POA: Diagnosis not present

## 2012-07-24 DIAGNOSIS — Z23 Encounter for immunization: Secondary | ICD-10-CM | POA: Diagnosis not present

## 2012-07-24 DIAGNOSIS — I4891 Unspecified atrial fibrillation: Secondary | ICD-10-CM | POA: Diagnosis not present

## 2012-07-24 DIAGNOSIS — Z7901 Long term (current) use of anticoagulants: Secondary | ICD-10-CM | POA: Diagnosis not present

## 2012-07-24 DIAGNOSIS — M4800 Spinal stenosis, site unspecified: Secondary | ICD-10-CM | POA: Diagnosis not present

## 2012-07-24 DIAGNOSIS — R7309 Other abnormal glucose: Secondary | ICD-10-CM | POA: Diagnosis not present

## 2012-07-30 DIAGNOSIS — M5137 Other intervertebral disc degeneration, lumbosacral region: Secondary | ICD-10-CM | POA: Diagnosis not present

## 2012-08-04 DIAGNOSIS — Z7901 Long term (current) use of anticoagulants: Secondary | ICD-10-CM | POA: Diagnosis not present

## 2012-08-04 DIAGNOSIS — I4891 Unspecified atrial fibrillation: Secondary | ICD-10-CM | POA: Diagnosis not present

## 2012-08-04 DIAGNOSIS — G589 Mononeuropathy, unspecified: Secondary | ICD-10-CM | POA: Diagnosis not present

## 2012-08-05 DIAGNOSIS — R5381 Other malaise: Secondary | ICD-10-CM | POA: Diagnosis not present

## 2012-09-08 DIAGNOSIS — Z7901 Long term (current) use of anticoagulants: Secondary | ICD-10-CM | POA: Diagnosis not present

## 2012-09-08 DIAGNOSIS — I4891 Unspecified atrial fibrillation: Secondary | ICD-10-CM | POA: Diagnosis not present

## 2012-09-23 DIAGNOSIS — I4891 Unspecified atrial fibrillation: Secondary | ICD-10-CM | POA: Diagnosis not present

## 2012-09-23 DIAGNOSIS — Z7901 Long term (current) use of anticoagulants: Secondary | ICD-10-CM | POA: Diagnosis not present

## 2012-10-22 DIAGNOSIS — Z7901 Long term (current) use of anticoagulants: Secondary | ICD-10-CM | POA: Diagnosis not present

## 2012-10-22 DIAGNOSIS — I4891 Unspecified atrial fibrillation: Secondary | ICD-10-CM | POA: Diagnosis not present

## 2012-11-05 DIAGNOSIS — I4891 Unspecified atrial fibrillation: Secondary | ICD-10-CM | POA: Diagnosis not present

## 2012-11-05 DIAGNOSIS — Z7901 Long term (current) use of anticoagulants: Secondary | ICD-10-CM | POA: Diagnosis not present

## 2012-11-27 DIAGNOSIS — Z7901 Long term (current) use of anticoagulants: Secondary | ICD-10-CM | POA: Diagnosis not present

## 2012-11-27 DIAGNOSIS — I4891 Unspecified atrial fibrillation: Secondary | ICD-10-CM | POA: Diagnosis not present

## 2012-12-25 DIAGNOSIS — Z7901 Long term (current) use of anticoagulants: Secondary | ICD-10-CM | POA: Diagnosis not present

## 2012-12-25 DIAGNOSIS — I4891 Unspecified atrial fibrillation: Secondary | ICD-10-CM | POA: Diagnosis not present

## 2013-01-04 DIAGNOSIS — R7309 Other abnormal glucose: Secondary | ICD-10-CM | POA: Diagnosis not present

## 2013-01-04 DIAGNOSIS — E785 Hyperlipidemia, unspecified: Secondary | ICD-10-CM | POA: Diagnosis not present

## 2013-01-04 DIAGNOSIS — R82998 Other abnormal findings in urine: Secondary | ICD-10-CM | POA: Diagnosis not present

## 2013-01-04 DIAGNOSIS — I10 Essential (primary) hypertension: Secondary | ICD-10-CM | POA: Diagnosis not present

## 2013-01-18 DIAGNOSIS — Z0000 Encounter for general adult medical examination without abnormal findings: Secondary | ICD-10-CM | POA: Diagnosis not present

## 2013-01-18 DIAGNOSIS — I10 Essential (primary) hypertension: Secondary | ICD-10-CM | POA: Diagnosis not present

## 2013-01-18 DIAGNOSIS — R209 Unspecified disturbances of skin sensation: Secondary | ICD-10-CM | POA: Diagnosis not present

## 2013-01-18 DIAGNOSIS — I4891 Unspecified atrial fibrillation: Secondary | ICD-10-CM | POA: Diagnosis not present

## 2013-01-20 DIAGNOSIS — Z1212 Encounter for screening for malignant neoplasm of rectum: Secondary | ICD-10-CM | POA: Diagnosis not present

## 2013-01-25 DIAGNOSIS — M79609 Pain in unspecified limb: Secondary | ICD-10-CM | POA: Diagnosis not present

## 2013-02-01 ENCOUNTER — Encounter (HOSPITAL_BASED_OUTPATIENT_CLINIC_OR_DEPARTMENT_OTHER): Payer: Self-pay | Admitting: *Deleted

## 2013-02-01 NOTE — Progress Notes (Signed)
Pt still active even though she has had multiple knee and hip surgeries-will come in wed for pt-ptt-bmet,ekg Brief hx AF post op one total knee-treated by pcp dr paterson-no cardiology

## 2013-02-03 ENCOUNTER — Encounter (HOSPITAL_BASED_OUTPATIENT_CLINIC_OR_DEPARTMENT_OTHER)
Admission: RE | Admit: 2013-02-03 | Discharge: 2013-02-03 | Disposition: A | Discharge status: Home / Self Care | Payer: Medicare Other | Source: Ambulatory Visit | Attending: Orthopedic Surgery | Admitting: Orthopedic Surgery

## 2013-02-03 DIAGNOSIS — M2040 Other hammer toe(s) (acquired), unspecified foot: Secondary | ICD-10-CM | POA: Diagnosis not present

## 2013-02-03 DIAGNOSIS — Z01812 Encounter for preprocedural laboratory examination: Secondary | ICD-10-CM | POA: Diagnosis not present

## 2013-02-03 DIAGNOSIS — Z01810 Encounter for preprocedural cardiovascular examination: Secondary | ICD-10-CM | POA: Diagnosis not present

## 2013-02-03 DIAGNOSIS — I10 Essential (primary) hypertension: Secondary | ICD-10-CM | POA: Diagnosis not present

## 2013-02-03 LAB — BASIC METABOLIC PANEL
BUN: 16 mg/dL (ref 6–23)
CO2: 27 mEq/L (ref 19–32)
Calcium: 9 mg/dL (ref 8.4–10.5)
Creatinine, Ser: 0.69 mg/dL (ref 0.50–1.10)

## 2013-02-03 LAB — APTT: aPTT: 32 seconds (ref 24–37)

## 2013-02-04 ENCOUNTER — Ambulatory Visit (HOSPITAL_BASED_OUTPATIENT_CLINIC_OR_DEPARTMENT_OTHER)
Admission: RE | Admit: 2013-02-04 | Discharge: 2013-02-04 | Disposition: A | Discharge status: Home / Self Care | Payer: Medicare Other | Source: Ambulatory Visit | Attending: Orthopedic Surgery | Admitting: Orthopedic Surgery

## 2013-02-04 ENCOUNTER — Ambulatory Visit (HOSPITAL_BASED_OUTPATIENT_CLINIC_OR_DEPARTMENT_OTHER): Payer: Medicare Other | Admitting: Anesthesiology

## 2013-02-04 ENCOUNTER — Encounter (HOSPITAL_BASED_OUTPATIENT_CLINIC_OR_DEPARTMENT_OTHER): Payer: Self-pay | Admitting: *Deleted

## 2013-02-04 ENCOUNTER — Encounter (HOSPITAL_BASED_OUTPATIENT_CLINIC_OR_DEPARTMENT_OTHER): Payer: Self-pay | Admitting: Anesthesiology

## 2013-02-04 ENCOUNTER — Encounter (HOSPITAL_BASED_OUTPATIENT_CLINIC_OR_DEPARTMENT_OTHER)
Admission: RE | Disposition: A | Discharge status: Home / Self Care | Payer: Self-pay | Source: Ambulatory Visit | Attending: Orthopedic Surgery

## 2013-02-04 DIAGNOSIS — I10 Essential (primary) hypertension: Secondary | ICD-10-CM | POA: Insufficient documentation

## 2013-02-04 DIAGNOSIS — M2040 Other hammer toe(s) (acquired), unspecified foot: Secondary | ICD-10-CM | POA: Diagnosis not present

## 2013-02-04 DIAGNOSIS — Z01810 Encounter for preprocedural cardiovascular examination: Secondary | ICD-10-CM | POA: Insufficient documentation

## 2013-02-04 DIAGNOSIS — M2041 Other hammer toe(s) (acquired), right foot: Secondary | ICD-10-CM

## 2013-02-04 DIAGNOSIS — Z01812 Encounter for preprocedural laboratory examination: Secondary | ICD-10-CM | POA: Diagnosis not present

## 2013-02-04 DIAGNOSIS — M79609 Pain in unspecified limb: Secondary | ICD-10-CM | POA: Diagnosis not present

## 2013-02-04 HISTORY — PX: AMPUTATION: SHX166

## 2013-02-04 HISTORY — DX: Gastro-esophageal reflux disease without esophagitis: K21.9

## 2013-02-04 HISTORY — DX: Anemia, unspecified: D64.9

## 2013-02-04 HISTORY — DX: Cardiac arrhythmia, unspecified: I49.9

## 2013-02-04 HISTORY — DX: Essential (primary) hypertension: I10

## 2013-02-04 SURGERY — AMPUTATION DIGIT
Anesthesia: Monitor Anesthesia Care | Site: Toe | Laterality: Right | Wound class: Clean

## 2013-02-04 MED ORDER — CLINDAMYCIN PHOSPHATE 600 MG/50ML IV SOLN
INTRAVENOUS | Status: DC | PRN
  Administered 2013-02-04: 600 mg via INTRAVENOUS

## 2013-02-04 MED ORDER — 0.9 % SODIUM CHLORIDE (POUR BTL) OPTIME
TOPICAL | Status: DC | PRN
  Administered 2013-02-04: 200 mL

## 2013-02-04 MED ORDER — MEPIVACAINE HCL 1.5 % IJ SOLN
INTRAMUSCULAR | Status: DC | PRN
  Administered 2013-02-04: 30 mg via EPIDURAL

## 2013-02-04 MED ORDER — HYDROMORPHONE HCL PF 1 MG/ML IJ SOLN: 0.2500 mg | INTRAMUSCULAR | Status: DC | PRN

## 2013-02-04 MED ORDER — CLINDAMYCIN PHOSPHATE 600 MG/50ML IV SOLN: INTRAVENOUS | Status: DC | PRN

## 2013-02-04 MED ORDER — LACTATED RINGERS IV SOLN
INTRAVENOUS | Status: DC
  Administered 2013-02-04: 10:00:00 via INTRAVENOUS

## 2013-02-04 MED ORDER — LIDOCAINE HCL (CARDIAC) 20 MG/ML IV SOLN
INTRAVENOUS | Status: DC | PRN
  Administered 2013-02-04: 30 mg via INTRAVENOUS

## 2013-02-04 MED ORDER — OXYCODONE HCL 5 MG/5ML PO SOLN: 5.0000 mg | Freq: Once | ORAL | Status: AC | PRN

## 2013-02-04 MED ORDER — FENTANYL CITRATE 0.05 MG/ML IJ SOLN
50.0000 ug | INTRAMUSCULAR | Status: DC | PRN
  Administered 2013-02-04: 50 ug via INTRAVENOUS

## 2013-02-04 MED ORDER — MIDAZOLAM HCL 2 MG/2ML IJ SOLN: 1.0000 mg | INTRAMUSCULAR | Status: DC | PRN

## 2013-02-04 MED ORDER — BACITRACIN ZINC 500 UNIT/GM EX OINT
TOPICAL_OINTMENT | CUTANEOUS | Status: DC | PRN
  Administered 2013-02-04: 1 via TOPICAL

## 2013-02-04 MED ORDER — SODIUM CHLORIDE 0.9 % IV SOLN: INTRAVENOUS | Status: DC

## 2013-02-04 MED ORDER — PROPOFOL INFUSION 10 MG/ML OPTIME
INTRAVENOUS | Status: DC | PRN
  Administered 2013-02-04: 75 ug/kg/min via INTRAVENOUS

## 2013-02-04 MED ORDER — ONDANSETRON HCL 4 MG/2ML IJ SOLN
INTRAMUSCULAR | Status: DC | PRN
  Administered 2013-02-04: 4 mg via INTRAVENOUS

## 2013-02-04 MED ORDER — CHLORHEXIDINE GLUCONATE 4 % EX LIQD: 60.0000 mL | Freq: Once | CUTANEOUS | Status: DC

## 2013-02-04 MED ORDER — OXYCODONE HCL 5 MG PO TABS
5.0000 mg | ORAL_TABLET | Freq: Once | ORAL | Status: AC | PRN
  Administered 2013-02-04: 5 mg via ORAL

## 2013-02-04 MED ORDER — HYDROCODONE-ACETAMINOPHEN 5-325 MG PO TABS: 1.0000 | ORAL_TABLET | Freq: Four times a day (QID) | ORAL | Status: DC | PRN

## 2013-02-04 MED ORDER — CEFAZOLIN SODIUM-DEXTROSE 2-3 GM-% IV SOLR: 2.0000 g | INTRAVENOUS | Status: DC

## 2013-02-04 SURGICAL SUPPLY — 61 items
BANDAGE COBAN STERILE 2 (GAUZE/BANDAGES/DRESSINGS) IMPLANT
BANDAGE CONFORM 3  STR LF (GAUZE/BANDAGES/DRESSINGS) IMPLANT
BANDAGE ESMARK 6X9 LF (GAUZE/BANDAGES/DRESSINGS) IMPLANT
BLADE OSCILLATING/SAGITTAL (BLADE)
BLADE SURG 10 STRL SS (BLADE) IMPLANT
BLADE SURG 15 STRL LF DISP TIS (BLADE) ×1 IMPLANT
BLADE SURG 15 STRL SS (BLADE) ×2
BLADE SW THK.38XMED NAR THN (BLADE) IMPLANT
BNDG CMPR 9X4 STRL LF SNTH (GAUZE/BANDAGES/DRESSINGS)
BNDG CMPR 9X6 STRL LF SNTH (GAUZE/BANDAGES/DRESSINGS)
BNDG COHESIVE 1X5 TAN STRL LF (GAUZE/BANDAGES/DRESSINGS) IMPLANT
BNDG COHESIVE 3X5 TAN STRL LF (GAUZE/BANDAGES/DRESSINGS) IMPLANT
BNDG COHESIVE 4X5 TAN STRL (GAUZE/BANDAGES/DRESSINGS) IMPLANT
BNDG ESMARK 4X9 LF (GAUZE/BANDAGES/DRESSINGS) IMPLANT
BNDG ESMARK 6X9 LF (GAUZE/BANDAGES/DRESSINGS)
CHLORAPREP W/TINT 26ML (MISCELLANEOUS) ×2 IMPLANT
COVER TABLE BACK 60X90 (DRAPES) ×2 IMPLANT
DRAPE EXTREMITY T 121X128X90 (DRAPE) ×2 IMPLANT
DRAPE SURG 17X23 STRL (DRAPES) IMPLANT
DRAPE U-SHAPE 47X51 STRL (DRAPES) IMPLANT
DRSG EMULSION OIL 3X3 NADH (GAUZE/BANDAGES/DRESSINGS) ×2 IMPLANT
DRSG PAD ABDOMINAL 8X10 ST (GAUZE/BANDAGES/DRESSINGS) IMPLANT
ELECT REM PT RETURN 9FT ADLT (ELECTROSURGICAL) ×2
ELECTRODE REM PT RTRN 9FT ADLT (ELECTROSURGICAL) ×1 IMPLANT
GAUZE SPONGE 4X4 16PLY XRAY LF (GAUZE/BANDAGES/DRESSINGS) IMPLANT
GLOVE BIO SURGEON STRL SZ8 (GLOVE) ×2 IMPLANT
GLOVE BIOGEL PI IND STRL 8 (GLOVE) ×1 IMPLANT
GLOVE BIOGEL PI INDICATOR 8 (GLOVE) ×1
GLOVE EXAM NITRILE MD LF STRL (GLOVE) IMPLANT
GOWN PREVENTION PLUS XLARGE (GOWN DISPOSABLE) ×2 IMPLANT
GOWN PREVENTION PLUS XXLARGE (GOWN DISPOSABLE) ×2 IMPLANT
NEEDLE HYPO 22GX1.5 SAFETY (NEEDLE) IMPLANT
NS IRRIG 1000ML POUR BTL (IV SOLUTION) ×2 IMPLANT
PACK BASIN DAY SURGERY FS (CUSTOM PROCEDURE TRAY) ×2 IMPLANT
PAD CAST 4YDX4 CTTN HI CHSV (CAST SUPPLIES) IMPLANT
PADDING CAST ABS 4INX4YD NS (CAST SUPPLIES) ×1
PADDING CAST ABS COTTON 4X4 ST (CAST SUPPLIES) ×1 IMPLANT
PADDING CAST COTTON 4X4 STRL (CAST SUPPLIES)
PENCIL BUTTON HOLSTER BLD 10FT (ELECTRODE) IMPLANT
SANITIZER HAND PURELL 535ML FO (MISCELLANEOUS) ×2 IMPLANT
SHEET MEDIUM DRAPE 40X70 STRL (DRAPES) ×2 IMPLANT
SPONGE GAUZE 4X4 12PLY (GAUZE/BANDAGES/DRESSINGS) ×4 IMPLANT
SPONGE LAP 18X18 X RAY DECT (DISPOSABLE) ×2 IMPLANT
STAPLER VISISTAT 35W (STAPLE) IMPLANT
STOCKINETTE 6  STRL (DRAPES) ×1
STOCKINETTE 6 STRL (DRAPES) ×1 IMPLANT
SUCTION FRAZIER TIP 10 FR DISP (SUCTIONS) IMPLANT
SUT ETHILON 3 0 FSL (SUTURE) IMPLANT
SUT ETHILON 3 0 PS 1 (SUTURE) IMPLANT
SUT ETHILON 4 0 PS 2 18 (SUTURE) ×2 IMPLANT
SUT MNCRL AB 3-0 PS2 18 (SUTURE) ×2 IMPLANT
SUT VIC AB 2-0 CT1 27 (SUTURE)
SUT VIC AB 2-0 CT1 TAPERPNT 27 (SUTURE) IMPLANT
SUT VIC AB 3-0 PS1 18 (SUTURE)
SUT VIC AB 3-0 PS1 18XBRD (SUTURE) IMPLANT
SYR BULB 3OZ (MISCELLANEOUS) ×2 IMPLANT
SYR CONTROL 10ML LL (SYRINGE) IMPLANT
TOWEL OR 17X24 6PK STRL BLUE (TOWEL DISPOSABLE) ×2 IMPLANT
TUBE CONNECTING 20X1/4 (TUBING) IMPLANT
UNDERPAD 30X30 INCONTINENT (UNDERPADS AND DIAPERS) ×2 IMPLANT
YANKAUER SUCT BULB TIP NO VENT (SUCTIONS) IMPLANT

## 2013-02-04 NOTE — Anesthesia Postprocedure Evaluation (Signed)
  Anesthesia Post-op Note  Patient: [name]  Procedure(s) Performed: Procedure(s): [name]  (Right)  Patient Location: PACU  Anesthesia Type:MAC combined with regional for post-op pain  Level of Consciousness: awake and alert   Airway and Oxygen Therapy: Patient Spontanous Breathing  Post-op Pain: none  Post-op Assessment: Post-op Vital signs reviewed, Patient's Cardiovascular Status Stable, Respiratory Function Stable, Patent Airway and No signs of Nausea or vomiting  Post-op Vital Signs: Reviewed and stable  Complications: No apparent anesthesia complications

## 2013-02-04 NOTE — Anesthesia Preprocedure Evaluation (Signed)
Anesthesia Evaluation  Patient identified by MRN, date of birth, ID band Patient awake    Reviewed: Allergy & Precautions, H&P , NPO status , Patient's Chart, lab work & pertinent test results, reviewed documented beta blocker date and time   Airway Mallampati: III TM Distance: >3 FB Neck ROM: Full    Dental no notable dental hx. (+) Teeth Intact and Dental Advisory Given   Pulmonary neg pulmonary ROS,  breath sounds clear to auscultation  Pulmonary exam normal       Cardiovascular hypertension, On Medications and On Home Beta Blockers + dysrhythmias Rhythm:Regular Rate:Normal     Neuro/Psych negative neurological ROS  negative psych ROS   GI/Hepatic Neg liver ROS, GERD-  Medicated and Controlled,  Endo/Other  negative endocrine ROS  Renal/GU negative Renal ROS  negative genitourinary   Musculoskeletal   Abdominal   Peds  Hematology negative hematology ROS (+)   Anesthesia Other Findings   Reproductive/Obstetrics negative OB ROS                           Anesthesia Physical Anesthesia Plan  ASA: II  Anesthesia Plan: MAC and Regional   Post-op Pain Management:    Induction: Intravenous  Airway Management Planned: Simple Face Mask  Additional Equipment:   Intra-op Plan:   Post-operative Plan:   Informed Consent: I have reviewed the patients History and Physical, chart, labs and discussed the procedure including the risks, benefits and alternatives for the proposed anesthesia with the patient or authorized representative who has indicated his/her understanding and acceptance.   Dental advisory given  Plan Discussed with: CRNA  Anesthesia Plan Comments:         Anesthesia Quick Evaluation

## 2013-02-04 NOTE — Transfer of Care (Signed)
Immediate Anesthesia Transfer of Care Note  Patient: [name]  Procedure(s) Performed: Procedure(s): [name]  (Right)  Patient Location: PACU  Anesthesia Type:MAC combined with regional for post-op pain  Level of Consciousness: awake, alert , oriented and patient cooperative  Airway & Oxygen Therapy: Patient Spontanous Breathing and Patient connected to face mask oxygen  Post-op Assessment: Report given to PACU RN and Post -op Vital signs reviewed and stable  Post vital signs: Reviewed and stable  Complications: No apparent anesthesia complications

## 2013-02-04 NOTE — Anesthesia Procedure Notes (Addendum)
Anesthesia Regional Block:  Ankle block  Pre-Anesthetic Checklist: ,, timeout performed, Correct Patient, Correct Site, Correct Laterality, Correct Procedure, Correct Position, site marked, Risks and benefits discussed, pre-op evaluation, post-op pain management  Laterality: Right  Prep: Maximum Sterile Barrier Precautions used and chloraprep       Needles:  Injection technique: Single-shot  Needle Type: Other     Needle Length: 3cm  Needle Gauge: 25 and 25 G    Additional Needles: Ankle block Narrative:  Start time: 02/04/2013 10:23 AM End time: 02/04/2013 10:30 AM Injection made incrementally with aspirations every 5 mL. Anesthesiologist: Sampson Goon, MD  Additional Notes: Superficial and deep Peroneal, Posterior Tibial, and Sural injected. Negative aspiration of blood prior to all injections.  Ankle block Procedure Name: MAC Date/Time: 02/04/2013 11:34 AM Performed by: Blessing Ozga D Pre-anesthesia Checklist: Patient identified, Emergency Drugs available, Suction available, Patient being monitored and Timeout performed Patient Re-evaluated:Patient Re-evaluated prior to inductionOxygen Delivery Method: Simple face mask

## 2013-02-04 NOTE — H&P (Signed)
[name] is an 79 y.o. female.   Chief Complaint: right 5th toe pain HPI: 79 y/o female with painful right 5th hammertoe deformity.  She desires amputation of the 5th toe in lieu of reconstruction since the recovery time is much shorter.  Past Medical History  Diagnosis Date  . Arthritis   . GERD (gastroesophageal reflux disease)   . Hypertension   . Dysrhythmia     hx brief AF post op hip surg  . Anemia     Past Surgical History  Procedure Laterality Date  . Total knee arthroplasty  2010,2012    left  . Total hip arthroplasty  2006    left-fx  . Total knee arthroplasty  2009    right  . Appendectomy    . Tonsillectomy    . Eye surgery      both cataracts  . Colonoscopy    . Incision and drainage  2011    left hip inf-hemovac  . Neck mass excision    . Knee arthroscopy  1995    left    History reviewed. No pertinent family history. Social History:  reports that she has never smoked. She does not have any smokeless tobacco history on file. She reports that she does not drink alcohol or use illicit drugs.  Allergies:  Allergies  Allergen Reactions  . Ceftriaxone Sodium     REACTION: tinly feeling after given, then one hour later while vanco infusing pruritic rash  . Sulfonamide Derivatives   . Vancomycin Hcl     REACTION: rash happened 1hr after rocephin but immediately after infusing vancomycin    Medications Prior to Admission  Medication Sig Dispense Refill  . Alum & Mag Hydroxide-Simeth (MAALOX REGULAR STRENGTH) 225-200-25 MG/5ML SUSP Take by mouth as directed. Per PCP       . calcium carbonate (CALCIUM 500) 1250 MG tablet Take by mouth as directed. Per PCP       . cyanocobalamin 1000 MCG tablet Take 100 mcg by mouth daily.      . Ferrous Sulfate (IRON) 325 (65 FE) MG TABS Take by mouth as directed. Per PCP       . metoprolol succinate (TOPROL-XL) 25 MG 24 hr tablet Take 25 mg by mouth every evening.       Marland Kitchen omeprazole (PRILOSEC) 20 MG capsule Take 20 mg  by mouth daily.      Marland Kitchen warfarin (COUMADIN) 1 MG tablet Take 1 mg by mouth as directed.        Marland Kitchen amLODipine (NORVASC) 5 MG tablet Take 1 tablet (5 mg total) by mouth daily.  31 tablet  3  . doxycycline (VIBRAMYCIN) 100 MG capsule Take 100 mg by mouth 2 (two) times daily. Take until instructed by Dr. [name]        Results for orders placed during the hospital encounter of 02/04/13 (from the past 48 hour(s))  BASIC METABOLIC PANEL     Status: Abnormal   Collection Time    02/03/13  9:00 AM      Result Value Range   Sodium 141  135 - 145 mEq/L   Potassium 4.3  3.5 - 5.1 mEq/L   Chloride 104  96 - 112 mEq/L   CO2 27  19 - 32 mEq/L   Glucose, Bld 207 (*) 70 - 99 mg/dL   BUN 16  6 - 23 mg/dL   Creatinine, Ser 1.61  0.50 - 1.10 mg/dL   Calcium 9.0  8.4 - 09.6  mg/dL   GFR calc non Af Amer 79 (*) >90 mL/min   GFR calc Af Amer >90  >90 mL/min   Comment:            The eGFR has been calculated     using the CKD EPI equation.     This calculation has not been     validated in all clinical     situations.     eGFR's persistently     <90 mL/min signify     possible Chronic Kidney Disease.  APTT     Status: None   Collection Time    02/03/13  9:00 AM      Result Value Range   aPTT 32  24 - 37 seconds  PROTIME-INR     Status: None   Collection Time    02/03/13  9:00 AM      Result Value Range   Prothrombin Time 14.0  11.6 - 15.2 seconds   INR 1.09  0.00 - 1.49  POCT HEMOGLOBIN-HEMACUE     Status: Abnormal   Collection Time    13-Feb-2013  9:54 AM      Result Value Range   Hemoglobin 15.1 (*) 12.0 - 15.0 g/dL   No results found.  ROS  No recent f/c/n/v/wt loss  Blood pressure 150/75, pulse 52, temperature 97.6 F (36.4 C), temperature source Oral, resp. rate 13, height 5\' 3"  (1.6 m), weight 56.473 kg (124 lb 8 oz), SpO2 96.00%. Physical Exam  [age] wd female in nad.  A and O.  Mood and affect normal.  EOMI.  Resp unlabored.  R 5th toe with severe hammetoe deformity.  Skin has a dorsal  keratosis that is quite tender.  No skin breakdown.  No lymphadenopathy.  5/5 strength in PF and DF of the ankle and toes.  Feels LT normally at the foot. Assessment/Plan Right painful 5th hammertoe deformity.  The risks and benefits of the alternative treatment options have been discussed in detail.  The patient wishes to proceed with surgery and specifically understands risks of bleeding, infection, nerve damage, blood clots, need for additional surgery, amputation and death.  Toni Arthurs 02-13-2013, 11:22 AM

## 2013-02-04 NOTE — Brief Op Note (Signed)
02/04/2013  12:05 PM  PATIENT:  [name]  79 y.o. female  PRE-OPERATIVE DIAGNOSIS:  right 5th toe pain and hammer toe deformity   POST-OPERATIVE DIAGNOSIS:  right 5th toe pain and hammer toe deformity  Procedure(s): [name] through MTP joint  SURGEON:  Toni Arthurs, MD  ASSISTANT: n/a  ANESTHESIA:   Local, mac  EBL:  minimal   TOURNIQUET:   Total Tourniquet Time Documented: Leg (Right) - 6 minutes Total: Leg (Right) - 6 minutes   COMPLICATIONS:  None apparent  DISPOSITION:  Extubated, awake and stable to recovery.  DICTATION ID:  [id number]

## 2013-02-04 NOTE — Progress Notes (Signed)
Assisted Dr. Fitzgerald with right, ankle block. Side rails up, monitors on throughout procedure. See vital signs in flow sheet. Tolerated Procedure well. 

## 2013-02-05 ENCOUNTER — Encounter (HOSPITAL_BASED_OUTPATIENT_CLINIC_OR_DEPARTMENT_OTHER): Payer: Self-pay | Admitting: Orthopedic Surgery

## 2013-02-05 NOTE — Op Note (Signed)
[name], [name]                 ACCOUNT NO.:  [id number]  MEDICAL RECORD NO.:  [id number]  LOCATION:                                 FACILITY:  PHYSICIAN:  Toni Arthurs, MD             DATE OF BIRTH:  DATE OF PROCEDURE:  02/04/2013 DATE OF DISCHARGE:                              OPERATIVE REPORT   PREOPERATIVE DIAGNOSIS:  [name] painful hammer toe deformity.  POSTOPERATIVE DIAGNOSIS:  [name] painful hammer toe deformity.  PROCEDURE:  [name] through the MTP joint.  SURGEON:  Toni Arthurs, MD  ANESTHESIA:  Local, MAC.  ESTIMATED BLOOD LOSS:  Minimal.  TOURNIQUET TIME:  6 minutes with an ankle Esmarch.  COMPLICATIONS:  None apparent.  DISPOSITION:  Extubated, awake, and stable to recovery.  INDICATIONS FOR PROCEDURE:  The patient is an 79 year old woman, who has a long history of a painful 5th hammertoe deformity.  She developed a painful corn over this dorsal lateral aspect of the toe.  We discussed extensively the reconstructive option including hammertoe correction, dorsal capsulotomy, extensor tendon lengthening, and pinning the toe. She asked about amputation of the 5th toe as an alternative.  This gives her a much shorter recovery time, and she elects this as definitive treatment of her 5th toe pain.  She has failed treatment with activity modification, shoe wear modifications, and silicone sleeve on the toe. She understands the risks and benefits, the alternative treatment options and elects surgical treatment.  She specifically understands risks of bleeding, infection, nerve damage, blood clots, need for additional surgery, revision amputation, and death.  PROCEDURE IN DETAIL:  After preoperative consent was obtained, the correct operative site was identified.  The patient was brought to the operating room and placed supine on the operating table.  IV sedation was administered.  Preoperative antibiotics were administered.   Surgical time-out was taken.  The right lower extremity was prepped and draped in standard sterile fashion.  The foot was exsanguinated and a 4 inch Esmarch tourniquet was wrapped around the ankle.  A racquet type incision was marked on the skin.  Sharp dissection was carried down through the skin and subcutaneous tissue to level of bone. Subperiosteal dissection was carried along the proximal phalanx and the toe was disarticulated through the MTP joint.  The toe was passed off the field.  The extensor and flexor tendons were trimmed.  The neurovascular bundles were cauterized.  The wound was irrigated copiously.  The tourniquet was released.  Hemostasis was achieved. Simple and horizontal mattress sutures of 3-0 nylon were used to close the incisions.  Sterile dressings were applied followed by compression wrap.  The patient was then awakened from anesthesia and transported to the recovery room in stable condition.  FOLLOWUP PLAN:  The patient will be weightbearing as tolerated in a hard- sole shoe.  She will follow up with me in 2 weeks for suture removal.     Toni Arthurs, MD     JH/MEDQ  D:  02/04/2013  T:  02/05/2013  Job:  [id number]

## 2013-02-23 DIAGNOSIS — I4891 Unspecified atrial fibrillation: Secondary | ICD-10-CM | POA: Diagnosis not present

## 2013-02-23 DIAGNOSIS — Z7901 Long term (current) use of anticoagulants: Secondary | ICD-10-CM | POA: Diagnosis not present

## 2013-03-02 DIAGNOSIS — Z780 Asymptomatic menopausal state: Secondary | ICD-10-CM | POA: Diagnosis not present

## 2013-03-30 DIAGNOSIS — I4891 Unspecified atrial fibrillation: Secondary | ICD-10-CM | POA: Diagnosis not present

## 2013-03-30 DIAGNOSIS — Z7901 Long term (current) use of anticoagulants: Secondary | ICD-10-CM | POA: Diagnosis not present

## 2013-05-04 DIAGNOSIS — Z79899 Other long term (current) drug therapy: Secondary | ICD-10-CM | POA: Diagnosis not present

## 2013-05-04 DIAGNOSIS — I4891 Unspecified atrial fibrillation: Secondary | ICD-10-CM | POA: Diagnosis not present

## 2013-05-04 DIAGNOSIS — M810 Age-related osteoporosis without current pathological fracture: Secondary | ICD-10-CM | POA: Diagnosis not present

## 2013-05-04 DIAGNOSIS — Z7901 Long term (current) use of anticoagulants: Secondary | ICD-10-CM | POA: Diagnosis not present

## 2013-05-26 ENCOUNTER — Encounter (HOSPITAL_COMMUNITY): Payer: Self-pay

## 2013-05-26 ENCOUNTER — Ambulatory Visit (HOSPITAL_COMMUNITY)
Admission: RE | Admit: 2013-05-26 | Discharge: 2013-05-26 | Disposition: A | Discharge status: Home / Self Care | Payer: Medicare Other | Source: Ambulatory Visit | Attending: Internal Medicine | Admitting: Internal Medicine

## 2013-05-26 ENCOUNTER — Other Ambulatory Visit (HOSPITAL_COMMUNITY): Payer: Self-pay | Admitting: Internal Medicine

## 2013-05-26 DIAGNOSIS — M810 Age-related osteoporosis without current pathological fracture: Secondary | ICD-10-CM | POA: Diagnosis not present

## 2013-05-26 MED ORDER — SODIUM CHLORIDE 0.9 % IV SOLN
Freq: Once | INTRAVENOUS | Status: AC
Start: 2013-05-26 — End: 2013-05-26
  Administered 2013-05-26: 20 mL/h via INTRAVENOUS

## 2013-05-26 MED ORDER — ZOLEDRONIC ACID 5 MG/100ML IV SOLN
5.0000 mg | Freq: Once | INTRAVENOUS | Status: AC
  Administered 2013-05-26: 5 mg via INTRAVENOUS
  Filled 2013-05-26: qty 100

## 2013-06-08 DIAGNOSIS — Z7901 Long term (current) use of anticoagulants: Secondary | ICD-10-CM | POA: Diagnosis not present

## 2013-06-08 DIAGNOSIS — I4891 Unspecified atrial fibrillation: Secondary | ICD-10-CM | POA: Diagnosis not present

## 2013-06-18 ENCOUNTER — Ambulatory Visit: Payer: Medicare Other | Attending: Internal Medicine | Admitting: Physical Therapy

## 2013-06-18 DIAGNOSIS — M6281 Muscle weakness (generalized): Secondary | ICD-10-CM | POA: Insufficient documentation

## 2013-06-18 DIAGNOSIS — Z96659 Presence of unspecified artificial knee joint: Secondary | ICD-10-CM | POA: Diagnosis not present

## 2013-06-18 DIAGNOSIS — Z9181 History of falling: Secondary | ICD-10-CM | POA: Diagnosis not present

## 2013-06-18 DIAGNOSIS — IMO0001 Reserved for inherently not codable concepts without codable children: Secondary | ICD-10-CM | POA: Diagnosis not present

## 2013-06-18 DIAGNOSIS — R262 Difficulty in walking, not elsewhere classified: Secondary | ICD-10-CM | POA: Insufficient documentation

## 2013-06-22 ENCOUNTER — Ambulatory Visit: Payer: Medicare Other | Admitting: Physical Therapy

## 2013-06-24 ENCOUNTER — Ambulatory Visit: Payer: Medicare Other | Admitting: Physical Therapy

## 2013-06-29 ENCOUNTER — Ambulatory Visit: Payer: Medicare Other | Admitting: Physical Therapy

## 2013-07-02 ENCOUNTER — Ambulatory Visit: Payer: Medicare Other | Admitting: Physical Therapy

## 2013-07-06 ENCOUNTER — Ambulatory Visit: Payer: Medicare Other | Admitting: Physical Therapy

## 2013-07-09 ENCOUNTER — Ambulatory Visit: Payer: Medicare Other | Admitting: Physical Therapy

## 2013-07-13 ENCOUNTER — Ambulatory Visit: Payer: Medicare Other | Admitting: Physical Therapy

## 2013-07-13 DIAGNOSIS — I4891 Unspecified atrial fibrillation: Secondary | ICD-10-CM | POA: Diagnosis not present

## 2013-07-13 DIAGNOSIS — Z7901 Long term (current) use of anticoagulants: Secondary | ICD-10-CM | POA: Diagnosis not present

## 2013-07-13 DIAGNOSIS — M2170 Unequal limb length (acquired), unspecified site: Secondary | ICD-10-CM | POA: Diagnosis not present

## 2013-07-16 ENCOUNTER — Ambulatory Visit: Payer: Medicare Other | Admitting: Physical Therapy

## 2013-07-20 ENCOUNTER — Ambulatory Visit: Payer: Medicare Other | Attending: Internal Medicine | Admitting: Physical Therapy

## 2013-07-20 DIAGNOSIS — Z96659 Presence of unspecified artificial knee joint: Secondary | ICD-10-CM | POA: Diagnosis not present

## 2013-07-20 DIAGNOSIS — M6281 Muscle weakness (generalized): Secondary | ICD-10-CM | POA: Insufficient documentation

## 2013-07-20 DIAGNOSIS — Z9181 History of falling: Secondary | ICD-10-CM | POA: Diagnosis not present

## 2013-07-20 DIAGNOSIS — IMO0001 Reserved for inherently not codable concepts without codable children: Secondary | ICD-10-CM | POA: Insufficient documentation

## 2013-07-20 DIAGNOSIS — R262 Difficulty in walking, not elsewhere classified: Secondary | ICD-10-CM | POA: Insufficient documentation

## 2013-07-23 ENCOUNTER — Ambulatory Visit: Payer: Medicare Other | Admitting: Physical Therapy

## 2013-07-26 DIAGNOSIS — Z961 Presence of intraocular lens: Secondary | ICD-10-CM | POA: Diagnosis not present

## 2013-07-27 ENCOUNTER — Ambulatory Visit: Payer: Medicare Other | Admitting: Physical Therapy

## 2013-07-30 ENCOUNTER — Ambulatory Visit: Payer: Medicare Other | Admitting: Physical Therapy

## 2013-08-03 ENCOUNTER — Ambulatory Visit: Payer: Medicare Other | Admitting: Physical Therapy

## 2013-08-06 ENCOUNTER — Ambulatory Visit: Payer: Medicare Other | Admitting: Physical Therapy

## 2013-08-10 ENCOUNTER — Ambulatory Visit: Payer: Medicare Other | Admitting: Physical Therapy

## 2013-08-10 DIAGNOSIS — Z23 Encounter for immunization: Secondary | ICD-10-CM | POA: Diagnosis not present

## 2013-08-11 DIAGNOSIS — I4891 Unspecified atrial fibrillation: Secondary | ICD-10-CM | POA: Diagnosis not present

## 2013-08-11 DIAGNOSIS — Z7901 Long term (current) use of anticoagulants: Secondary | ICD-10-CM | POA: Diagnosis not present

## 2013-08-13 ENCOUNTER — Ambulatory Visit: Payer: Medicare Other | Admitting: Physical Therapy

## 2013-09-08 DIAGNOSIS — I4891 Unspecified atrial fibrillation: Secondary | ICD-10-CM | POA: Diagnosis not present

## 2013-09-08 DIAGNOSIS — L84 Corns and callosities: Secondary | ICD-10-CM | POA: Diagnosis not present

## 2013-09-08 DIAGNOSIS — Z7901 Long term (current) use of anticoagulants: Secondary | ICD-10-CM | POA: Diagnosis not present

## 2013-09-30 DIAGNOSIS — B86 Scabies: Secondary | ICD-10-CM | POA: Diagnosis not present

## 2013-09-30 DIAGNOSIS — D235 Other benign neoplasm of skin of trunk: Secondary | ICD-10-CM | POA: Diagnosis not present

## 2013-10-05 DIAGNOSIS — Z7901 Long term (current) use of anticoagulants: Secondary | ICD-10-CM | POA: Diagnosis not present

## 2013-10-05 DIAGNOSIS — I4891 Unspecified atrial fibrillation: Secondary | ICD-10-CM | POA: Diagnosis not present

## 2013-11-02 DIAGNOSIS — Z7901 Long term (current) use of anticoagulants: Secondary | ICD-10-CM | POA: Diagnosis not present

## 2013-11-02 DIAGNOSIS — I4891 Unspecified atrial fibrillation: Secondary | ICD-10-CM | POA: Diagnosis not present

## 2013-11-30 DIAGNOSIS — Z7901 Long term (current) use of anticoagulants: Secondary | ICD-10-CM | POA: Diagnosis not present

## 2013-11-30 DIAGNOSIS — I4891 Unspecified atrial fibrillation: Secondary | ICD-10-CM | POA: Diagnosis not present

## 2013-12-29 DIAGNOSIS — I4891 Unspecified atrial fibrillation: Secondary | ICD-10-CM | POA: Diagnosis not present

## 2013-12-29 DIAGNOSIS — Z7901 Long term (current) use of anticoagulants: Secondary | ICD-10-CM | POA: Diagnosis not present

## 2014-01-19 DIAGNOSIS — I10 Essential (primary) hypertension: Secondary | ICD-10-CM | POA: Diagnosis not present

## 2014-01-19 DIAGNOSIS — E785 Hyperlipidemia, unspecified: Secondary | ICD-10-CM | POA: Diagnosis not present

## 2014-01-19 DIAGNOSIS — R82998 Other abnormal findings in urine: Secondary | ICD-10-CM | POA: Diagnosis not present

## 2014-01-19 DIAGNOSIS — M810 Age-related osteoporosis without current pathological fracture: Secondary | ICD-10-CM | POA: Diagnosis not present

## 2014-01-19 DIAGNOSIS — I4891 Unspecified atrial fibrillation: Secondary | ICD-10-CM | POA: Diagnosis not present

## 2014-01-19 DIAGNOSIS — R809 Proteinuria, unspecified: Secondary | ICD-10-CM | POA: Diagnosis not present

## 2014-01-19 DIAGNOSIS — Z7901 Long term (current) use of anticoagulants: Secondary | ICD-10-CM | POA: Diagnosis not present

## 2014-01-26 DIAGNOSIS — R1312 Dysphagia, oropharyngeal phase: Secondary | ICD-10-CM | POA: Diagnosis not present

## 2014-01-26 DIAGNOSIS — E1159 Type 2 diabetes mellitus with other circulatory complications: Secondary | ICD-10-CM | POA: Diagnosis not present

## 2014-01-26 DIAGNOSIS — I739 Peripheral vascular disease, unspecified: Secondary | ICD-10-CM | POA: Diagnosis not present

## 2014-01-26 DIAGNOSIS — Z1331 Encounter for screening for depression: Secondary | ICD-10-CM | POA: Diagnosis not present

## 2014-01-26 DIAGNOSIS — I4891 Unspecified atrial fibrillation: Secondary | ICD-10-CM | POA: Diagnosis not present

## 2014-01-26 DIAGNOSIS — Z7901 Long term (current) use of anticoagulants: Secondary | ICD-10-CM | POA: Diagnosis not present

## 2014-01-26 DIAGNOSIS — Z0000 Encounter for general adult medical examination without abnormal findings: Secondary | ICD-10-CM | POA: Diagnosis not present

## 2014-01-26 DIAGNOSIS — R269 Unspecified abnormalities of gait and mobility: Secondary | ICD-10-CM | POA: Diagnosis not present

## 2014-01-26 DIAGNOSIS — M810 Age-related osteoporosis without current pathological fracture: Secondary | ICD-10-CM | POA: Diagnosis not present

## 2014-01-27 ENCOUNTER — Other Ambulatory Visit: Payer: Self-pay | Admitting: Internal Medicine

## 2014-01-27 DIAGNOSIS — R1312 Dysphagia, oropharyngeal phase: Secondary | ICD-10-CM

## 2014-01-27 DIAGNOSIS — R1310 Dysphagia, unspecified: Secondary | ICD-10-CM

## 2014-01-31 ENCOUNTER — Ambulatory Visit
Admission: RE | Admit: 2014-01-31 | Discharge: 2014-01-31 | Disposition: A | Discharge status: Home / Self Care | Payer: Medicare Other | Source: Ambulatory Visit | Attending: Internal Medicine | Admitting: Internal Medicine

## 2014-01-31 DIAGNOSIS — K449 Diaphragmatic hernia without obstruction or gangrene: Secondary | ICD-10-CM | POA: Diagnosis not present

## 2014-01-31 DIAGNOSIS — K224 Dyskinesia of esophagus: Secondary | ICD-10-CM | POA: Diagnosis not present

## 2014-01-31 DIAGNOSIS — R1312 Dysphagia, oropharyngeal phase: Secondary | ICD-10-CM

## 2014-01-31 DIAGNOSIS — R1310 Dysphagia, unspecified: Secondary | ICD-10-CM

## 2014-01-31 DIAGNOSIS — Z8719 Personal history of other diseases of the digestive system: Secondary | ICD-10-CM

## 2014-01-31 HISTORY — DX: Personal history of other diseases of the digestive system: Z87.19

## 2014-01-31 HISTORY — DX: Dyskinesia of esophagus: K22.4

## 2014-02-23 DIAGNOSIS — I4891 Unspecified atrial fibrillation: Secondary | ICD-10-CM | POA: Diagnosis not present

## 2014-02-23 DIAGNOSIS — Z7901 Long term (current) use of anticoagulants: Secondary | ICD-10-CM | POA: Diagnosis not present

## 2014-03-23 DIAGNOSIS — Z7901 Long term (current) use of anticoagulants: Secondary | ICD-10-CM | POA: Diagnosis not present

## 2014-03-23 DIAGNOSIS — I4891 Unspecified atrial fibrillation: Secondary | ICD-10-CM | POA: Diagnosis not present

## 2014-04-20 DIAGNOSIS — IMO0002 Reserved for concepts with insufficient information to code with codable children: Secondary | ICD-10-CM | POA: Diagnosis not present

## 2014-04-20 DIAGNOSIS — I4891 Unspecified atrial fibrillation: Secondary | ICD-10-CM | POA: Diagnosis not present

## 2014-04-20 DIAGNOSIS — L84 Corns and callosities: Secondary | ICD-10-CM | POA: Diagnosis not present

## 2014-04-20 DIAGNOSIS — Z7901 Long term (current) use of anticoagulants: Secondary | ICD-10-CM | POA: Diagnosis not present

## 2014-04-21 DIAGNOSIS — M5137 Other intervertebral disc degeneration, lumbosacral region: Secondary | ICD-10-CM | POA: Diagnosis not present

## 2014-04-21 DIAGNOSIS — R269 Unspecified abnormalities of gait and mobility: Secondary | ICD-10-CM | POA: Diagnosis not present

## 2014-04-21 DIAGNOSIS — M545 Low back pain, unspecified: Secondary | ICD-10-CM | POA: Diagnosis not present

## 2014-04-21 DIAGNOSIS — M949 Disorder of cartilage, unspecified: Secondary | ICD-10-CM | POA: Diagnosis not present

## 2014-04-21 DIAGNOSIS — IMO0002 Reserved for concepts with insufficient information to code with codable children: Secondary | ICD-10-CM | POA: Diagnosis not present

## 2014-04-21 DIAGNOSIS — M899 Disorder of bone, unspecified: Secondary | ICD-10-CM | POA: Diagnosis not present

## 2014-04-21 DIAGNOSIS — I10 Essential (primary) hypertension: Secondary | ICD-10-CM | POA: Diagnosis not present

## 2014-04-21 DIAGNOSIS — M8448XA Pathological fracture, other site, initial encounter for fracture: Secondary | ICD-10-CM | POA: Diagnosis not present

## 2014-04-25 ENCOUNTER — Other Ambulatory Visit: Payer: Self-pay | Admitting: Internal Medicine

## 2014-04-25 DIAGNOSIS — M549 Dorsalgia, unspecified: Secondary | ICD-10-CM

## 2014-05-02 ENCOUNTER — Ambulatory Visit
Admission: RE | Admit: 2014-05-02 | Discharge: 2014-05-02 | Disposition: A | Discharge status: Home / Self Care | Payer: Medicare Other | Source: Ambulatory Visit | Attending: Internal Medicine | Admitting: Internal Medicine

## 2014-05-02 DIAGNOSIS — M549 Dorsalgia, unspecified: Secondary | ICD-10-CM

## 2014-05-02 DIAGNOSIS — D1809 Hemangioma of other sites: Secondary | ICD-10-CM | POA: Diagnosis not present

## 2014-05-02 DIAGNOSIS — M47817 Spondylosis without myelopathy or radiculopathy, lumbosacral region: Secondary | ICD-10-CM | POA: Diagnosis not present

## 2014-05-02 DIAGNOSIS — IMO0002 Reserved for concepts with insufficient information to code with codable children: Secondary | ICD-10-CM | POA: Diagnosis not present

## 2014-05-02 DIAGNOSIS — M48061 Spinal stenosis, lumbar region without neurogenic claudication: Secondary | ICD-10-CM | POA: Diagnosis not present

## 2014-05-04 ENCOUNTER — Other Ambulatory Visit: Payer: Self-pay | Admitting: Internal Medicine

## 2014-05-04 DIAGNOSIS — N83209 Unspecified ovarian cyst, unspecified side: Secondary | ICD-10-CM

## 2014-05-06 ENCOUNTER — Ambulatory Visit
Admission: RE | Admit: 2014-05-06 | Discharge: 2014-05-06 | Disposition: A | Discharge status: Home / Self Care | Payer: Medicare Other | Source: Ambulatory Visit | Attending: Internal Medicine | Admitting: Internal Medicine

## 2014-05-06 DIAGNOSIS — N83209 Unspecified ovarian cyst, unspecified side: Secondary | ICD-10-CM

## 2014-05-06 DIAGNOSIS — N838 Other noninflammatory disorders of ovary, fallopian tube and broad ligament: Secondary | ICD-10-CM | POA: Diagnosis not present

## 2014-05-18 DIAGNOSIS — I4891 Unspecified atrial fibrillation: Secondary | ICD-10-CM | POA: Diagnosis not present

## 2014-05-18 DIAGNOSIS — Z7901 Long term (current) use of anticoagulants: Secondary | ICD-10-CM | POA: Diagnosis not present

## 2014-06-01 DIAGNOSIS — I4891 Unspecified atrial fibrillation: Secondary | ICD-10-CM | POA: Diagnosis not present

## 2014-06-01 DIAGNOSIS — Z7901 Long term (current) use of anticoagulants: Secondary | ICD-10-CM | POA: Diagnosis not present

## 2014-06-09 ENCOUNTER — Other Ambulatory Visit: Payer: Self-pay | Admitting: Obstetrics and Gynecology

## 2014-06-09 DIAGNOSIS — Z124 Encounter for screening for malignant neoplasm of cervix: Secondary | ICD-10-CM | POA: Diagnosis not present

## 2014-06-09 DIAGNOSIS — N840 Polyp of corpus uteri: Secondary | ICD-10-CM | POA: Diagnosis not present

## 2014-06-09 DIAGNOSIS — D261 Other benign neoplasm of corpus uteri: Secondary | ICD-10-CM | POA: Diagnosis not present

## 2014-06-09 DIAGNOSIS — D4959 Neoplasm of unspecified behavior of other genitourinary organ: Secondary | ICD-10-CM | POA: Diagnosis not present

## 2014-06-10 LAB — CYTOLOGY - PAP

## 2014-06-29 DIAGNOSIS — I4891 Unspecified atrial fibrillation: Secondary | ICD-10-CM | POA: Diagnosis not present

## 2014-06-29 DIAGNOSIS — Z7901 Long term (current) use of anticoagulants: Secondary | ICD-10-CM | POA: Diagnosis not present

## 2014-07-21 DIAGNOSIS — N83209 Unspecified ovarian cyst, unspecified side: Secondary | ICD-10-CM | POA: Diagnosis not present

## 2014-07-28 DIAGNOSIS — Z1231 Encounter for screening mammogram for malignant neoplasm of breast: Secondary | ICD-10-CM | POA: Diagnosis not present

## 2014-07-29 DIAGNOSIS — M545 Low back pain, unspecified: Secondary | ICD-10-CM | POA: Diagnosis not present

## 2014-07-29 DIAGNOSIS — Z7901 Long term (current) use of anticoagulants: Secondary | ICD-10-CM | POA: Diagnosis not present

## 2014-07-29 DIAGNOSIS — R269 Unspecified abnormalities of gait and mobility: Secondary | ICD-10-CM | POA: Diagnosis not present

## 2014-07-29 DIAGNOSIS — IMO0002 Reserved for concepts with insufficient information to code with codable children: Secondary | ICD-10-CM | POA: Diagnosis not present

## 2014-07-29 DIAGNOSIS — I739 Peripheral vascular disease, unspecified: Secondary | ICD-10-CM | POA: Diagnosis not present

## 2014-07-29 DIAGNOSIS — E1159 Type 2 diabetes mellitus with other circulatory complications: Secondary | ICD-10-CM | POA: Diagnosis not present

## 2014-08-08 DIAGNOSIS — Z961 Presence of intraocular lens: Secondary | ICD-10-CM | POA: Diagnosis not present

## 2014-08-29 DIAGNOSIS — Z23 Encounter for immunization: Secondary | ICD-10-CM | POA: Diagnosis not present

## 2014-08-29 DIAGNOSIS — I4891 Unspecified atrial fibrillation: Secondary | ICD-10-CM | POA: Diagnosis not present

## 2014-08-29 DIAGNOSIS — Z7901 Long term (current) use of anticoagulants: Secondary | ICD-10-CM | POA: Diagnosis not present

## 2014-09-09 ENCOUNTER — Emergency Department (HOSPITAL_COMMUNITY)
Admission: EM | Admit: 2014-09-09 | Discharge: 2014-09-09 | Disposition: A | Discharge status: Home / Self Care | Payer: Medicare Other | Attending: Emergency Medicine | Admitting: Emergency Medicine

## 2014-09-09 ENCOUNTER — Encounter (HOSPITAL_COMMUNITY): Payer: Self-pay | Admitting: Emergency Medicine

## 2014-09-09 DIAGNOSIS — D649 Anemia, unspecified: Secondary | ICD-10-CM | POA: Insufficient documentation

## 2014-09-09 DIAGNOSIS — S51811A Laceration without foreign body of right forearm, initial encounter: Secondary | ICD-10-CM | POA: Diagnosis not present

## 2014-09-09 DIAGNOSIS — Y9389 Activity, other specified: Secondary | ICD-10-CM | POA: Insufficient documentation

## 2014-09-09 DIAGNOSIS — K219 Gastro-esophageal reflux disease without esophagitis: Secondary | ICD-10-CM | POA: Diagnosis not present

## 2014-09-09 DIAGNOSIS — Z7901 Long term (current) use of anticoagulants: Secondary | ICD-10-CM | POA: Insufficient documentation

## 2014-09-09 DIAGNOSIS — I10 Essential (primary) hypertension: Secondary | ICD-10-CM | POA: Insufficient documentation

## 2014-09-09 DIAGNOSIS — S59911A Unspecified injury of right forearm, initial encounter: Secondary | ICD-10-CM | POA: Diagnosis present

## 2014-09-09 DIAGNOSIS — M1990 Unspecified osteoarthritis, unspecified site: Secondary | ICD-10-CM | POA: Diagnosis not present

## 2014-09-09 DIAGNOSIS — Z8679 Personal history of other diseases of the circulatory system: Secondary | ICD-10-CM | POA: Insufficient documentation

## 2014-09-09 DIAGNOSIS — IMO0002 Reserved for concepts with insufficient information to code with codable children: Secondary | ICD-10-CM

## 2014-09-09 DIAGNOSIS — Z23 Encounter for immunization: Secondary | ICD-10-CM | POA: Diagnosis not present

## 2014-09-09 DIAGNOSIS — W1811XA Fall from or off toilet without subsequent striking against object, initial encounter: Secondary | ICD-10-CM | POA: Diagnosis not present

## 2014-09-09 DIAGNOSIS — Y92002 Bathroom of unspecified non-institutional (private) residence single-family (private) house as the place of occurrence of the external cause: Secondary | ICD-10-CM | POA: Diagnosis not present

## 2014-09-09 DIAGNOSIS — Z79899 Other long term (current) drug therapy: Secondary | ICD-10-CM | POA: Insufficient documentation

## 2014-09-09 MED ORDER — TETANUS-DIPHTH-ACELL PERTUSSIS 5-2.5-18.5 LF-MCG/0.5 IM SUSP
0.5000 mL | Freq: Once | INTRAMUSCULAR | Status: AC
  Administered 2014-09-09: 0.5 mL via INTRAMUSCULAR
  Filled 2014-09-09: qty 0.5

## 2014-09-09 MED ORDER — LIDOCAINE-EPINEPHRINE-TETRACAINE (LET) SOLUTION
3.0000 mL | Freq: Once | NASAL | Status: AC
  Administered 2014-09-09: 3 mL via TOPICAL
  Filled 2014-09-09: qty 3

## 2014-09-09 MED ORDER — IBUPROFEN 800 MG PO TABS
800.0000 mg | ORAL_TABLET | Freq: Once | ORAL | Status: AC
  Administered 2014-09-09: 800 mg via ORAL
  Filled 2014-09-09: qty 1

## 2014-09-09 NOTE — ED Notes (Signed)
Per Dr. Randal Buba, irrigated pt wound with a Povidone-Iodine Solution and 0.9% Sodium Chloride Irrigation, USP mixture

## 2014-09-09 NOTE — ED Notes (Signed)
Pt. lost her balance and fell in bathroom at home this evening , no LOC / ambulatory , presents with right forearm laceration with moderate bleeding - dressing applied at triage , pt. stated she is taking Coumadin . Alert and oriented?respirations unlabored . Hypertensive at triage .

## 2014-09-09 NOTE — ED Provider Notes (Signed)
CSN: [id number]     Arrival date & time 09/09/14  0201 History   First MD Initiated Contact with Patient 09/09/14 [phone]     Chief Complaint  Patient presents with  . Fall     (Consider location/radiation/quality/duration/timing/severity/associated sxs/prior Treatment) Patient is a 80 y.o. female presenting with skin laceration. The history is provided by the patient.  Laceration Location:  Shoulder/arm Shoulder/arm laceration location:  R forearm Depth:  Through dermis Quality comment:  L shaped Bleeding: controlled   Injury mechanism: toilet flusher did not actually fall, appreciate nurses note.  No LOC did not strike head.   Pain details:    Quality:  Aching   Severity:  Mild   Progression:  Unchanged Foreign body present:  No foreign bodies Relieved by:  Nothing Worsened by:  Nothing tried Ineffective treatments:  None tried Tetanus status:  Unknown   Past Medical History  Diagnosis Date  . Arthritis   . GERD (gastroesophageal reflux disease)   . Hypertension   . Dysrhythmia     hx brief AF post op hip surg  . Anemia    Past Surgical History  Procedure Laterality Date  . Total knee arthroplasty  2010,2012    left  . Total hip arthroplasty  2006    left-fx  . Total knee arthroplasty  2009    right  . Appendectomy    . Tonsillectomy    . Eye surgery      both cataracts  . Colonoscopy    . Incision and drainage  2011    left hip inf-hemovac  . Neck mass excision    . Knee arthroscopy  1995    left  . Amputation Right 02/04/2013    Procedure: [name] ;  Surgeon: Wylene Simmer, MD;  Location: [name];  Service: Orthopedics;  Laterality: Right;   No family history on file. History  Substance Use Topics  . Smoking status: Never Smoker   . Smokeless tobacco: Not on file  . Alcohol Use: No   OB History   Grav Para Term Preterm Abortions TAB SAB Ect Mult Living                 Review of Systems  Constitutional: Negative  for fever.  Respiratory: Negative for shortness of breath.   Cardiovascular: Negative for chest pain.  Neurological: Negative for dizziness, syncope, facial asymmetry and headaches.  All other systems reviewed and are negative.     Allergies  Ceftriaxone sodium; Sulfonamide derivatives; and Vancomycin hcl  Home Medications   Prior to Admission medications   Medication Sig Start Date End Date Taking? Authorizing Provider  Alum & Mag Hydroxide-Simeth (MAALOX REGULAR STRENGTH) 225-200-25 MG/5ML SUSP Take by mouth as directed. Per PCP     Historical Provider, MD  calcium carbonate (CALCIUM 500) 1250 MG tablet Take by mouth as directed. Per PCP     Historical Provider, MD  cyanocobalamin 1000 MCG tablet Take 100 mcg by mouth daily.    Historical Provider, MD  Ferrous Sulfate (IRON) 325 (65 FE) MG TABS Take by mouth as directed. Per PCP     Historical Provider, MD  HYDROcodone-acetaminophen (NORCO) 5-325 MG per tablet Take 1 tablet by mouth every 6 (six) hours as needed for pain. 02/04/13   Wylene Simmer, MD  metoprolol succinate (TOPROL-XL) 25 MG 24 hr tablet Take 25 mg by mouth every evening.     Historical Provider, MD  omeprazole (PRILOSEC) 20 MG capsule Take 20  mg by mouth daily.    Historical Provider, MD  warfarin (COUMADIN) 1 MG tablet Take 1 mg by mouth as directed.      Historical Provider, MD   BP 209/91  Pulse 67  Temp(Src) 98.2 F (36.8 C) (Oral)  Resp 16  Ht 5\' 3"  (1.6 m)  Wt 120 lb (54.432 kg)  BMI 21.26 kg/m2  SpO2 99% Physical Exam  Constitutional: She is oriented to person, place, and time. She appears well-developed and well-nourished. No distress.  HENT:  Head: Normocephalic and atraumatic. Head is without raccoon's eyes and without Battle's sign.  Right Ear: External ear normal. No hemotympanum.  Left Ear: External ear normal. No hemotympanum.  Mouth/Throat: Oropharynx is clear and moist.  Eyes: Conjunctivae and EOM are normal. Pupils are equal, round, and  reactive to light.  Neck: Normal range of motion. Neck supple.  Cardiovascular: Normal rate, regular rhythm and intact distal pulses.   Pulmonary/Chest: Effort normal and breath sounds normal. She has no wheezes. She has no rales.  Abdominal: Soft. Bowel sounds are normal. There is no tenderness. There is no rebound and no guarding.  Musculoskeletal: Normal range of motion. She exhibits no tenderness.       Arms: Neurological: She is alert and oriented to person, place, and time.  Skin: Skin is warm and dry.  Psychiatric: She has a normal mood and affect.    ED Course  Procedures (including critical care time) Labs Review Labs Reviewed - No data to display  Imaging Review No results found.   EKG Interpretation None      MDM   Final diagnoses:  None    LACERATION REPAIR Performed by: Carlisle Beers Authorized by: Carlisle Beers Consent: Verbal consent obtained. Risks and benefits: risks, benefits and alternatives were discussed Consent given by: patient Patient identity confirmed: provided demographic data Prepped and Draped in normal sterile fashion Wound explored  Laceration Location: volar right forearm  Laceration Length: 5 cm  No Foreign Bodies seen or palpated  Anesthesia: topical  Local anesthetic: LET  Irrigation method: syringe Amount of cleaning: standard  Skin closure: staples  Number of sutures: 11  Technique: staples  Patient tolerance: Patient tolerated the procedure well with no immediate complications.  Ice for 20 minutes Q 2 hrs and elevation.  Continue norco for pain.  Staple removal in 10 days at urgent care     [name] K Carolanne Mercier-Rasch, MD 09/09/14 [phone]

## 2014-09-19 DIAGNOSIS — Z4802 Encounter for removal of sutures: Secondary | ICD-10-CM | POA: Diagnosis not present

## 2014-09-19 DIAGNOSIS — Z7901 Long term (current) use of anticoagulants: Secondary | ICD-10-CM | POA: Diagnosis not present

## 2014-09-19 DIAGNOSIS — I4891 Unspecified atrial fibrillation: Secondary | ICD-10-CM | POA: Diagnosis not present

## 2014-10-18 DIAGNOSIS — I4891 Unspecified atrial fibrillation: Secondary | ICD-10-CM | POA: Diagnosis not present

## 2014-10-18 DIAGNOSIS — Z7901 Long term (current) use of anticoagulants: Secondary | ICD-10-CM | POA: Diagnosis not present

## 2014-11-02 DIAGNOSIS — I739 Peripheral vascular disease, unspecified: Secondary | ICD-10-CM | POA: Diagnosis not present

## 2014-11-02 DIAGNOSIS — Z7901 Long term (current) use of anticoagulants: Secondary | ICD-10-CM | POA: Diagnosis not present

## 2014-11-02 DIAGNOSIS — I4891 Unspecified atrial fibrillation: Secondary | ICD-10-CM | POA: Diagnosis not present

## 2014-11-02 DIAGNOSIS — Z6822 Body mass index (BMI) 22.0-22.9, adult: Secondary | ICD-10-CM | POA: Diagnosis not present

## 2014-11-02 DIAGNOSIS — I10 Essential (primary) hypertension: Secondary | ICD-10-CM | POA: Diagnosis not present

## 2014-11-02 DIAGNOSIS — R0609 Other forms of dyspnea: Secondary | ICD-10-CM | POA: Diagnosis not present

## 2014-11-02 DIAGNOSIS — M4800 Spinal stenosis, site unspecified: Secondary | ICD-10-CM | POA: Diagnosis not present

## 2014-11-02 DIAGNOSIS — E1151 Type 2 diabetes mellitus with diabetic peripheral angiopathy without gangrene: Secondary | ICD-10-CM | POA: Diagnosis not present

## 2014-11-25 DIAGNOSIS — R1904 Left lower quadrant abdominal swelling, mass and lump: Secondary | ICD-10-CM | POA: Diagnosis not present

## 2014-12-02 DIAGNOSIS — I4891 Unspecified atrial fibrillation: Secondary | ICD-10-CM | POA: Diagnosis not present

## 2014-12-02 DIAGNOSIS — Z7901 Long term (current) use of anticoagulants: Secondary | ICD-10-CM | POA: Diagnosis not present

## 2014-12-02 DIAGNOSIS — R0609 Other forms of dyspnea: Secondary | ICD-10-CM | POA: Diagnosis not present

## 2014-12-02 DIAGNOSIS — R918 Other nonspecific abnormal finding of lung field: Secondary | ICD-10-CM | POA: Diagnosis not present

## 2015-01-03 DIAGNOSIS — I4891 Unspecified atrial fibrillation: Secondary | ICD-10-CM | POA: Diagnosis not present

## 2015-01-03 DIAGNOSIS — Z7901 Long term (current) use of anticoagulants: Secondary | ICD-10-CM | POA: Diagnosis not present

## 2015-02-02 DIAGNOSIS — R739 Hyperglycemia, unspecified: Secondary | ICD-10-CM | POA: Diagnosis not present

## 2015-02-02 DIAGNOSIS — M545 Low back pain: Secondary | ICD-10-CM | POA: Diagnosis not present

## 2015-02-02 DIAGNOSIS — Z6823 Body mass index (BMI) 23.0-23.9, adult: Secondary | ICD-10-CM | POA: Diagnosis not present

## 2015-02-02 DIAGNOSIS — I4891 Unspecified atrial fibrillation: Secondary | ICD-10-CM | POA: Diagnosis not present

## 2015-02-02 DIAGNOSIS — Z7901 Long term (current) use of anticoagulants: Secondary | ICD-10-CM | POA: Diagnosis not present

## 2015-02-02 DIAGNOSIS — R269 Unspecified abnormalities of gait and mobility: Secondary | ICD-10-CM | POA: Diagnosis not present

## 2015-02-02 DIAGNOSIS — M4800 Spinal stenosis, site unspecified: Secondary | ICD-10-CM | POA: Diagnosis not present

## 2015-02-02 DIAGNOSIS — E1151 Type 2 diabetes mellitus with diabetic peripheral angiopathy without gangrene: Secondary | ICD-10-CM | POA: Diagnosis not present

## 2015-02-02 DIAGNOSIS — I739 Peripheral vascular disease, unspecified: Secondary | ICD-10-CM | POA: Diagnosis not present

## 2015-02-02 DIAGNOSIS — I10 Essential (primary) hypertension: Secondary | ICD-10-CM | POA: Diagnosis not present

## 2015-02-07 DIAGNOSIS — M25552 Pain in left hip: Secondary | ICD-10-CM | POA: Diagnosis not present

## 2015-02-07 DIAGNOSIS — Z471 Aftercare following joint replacement surgery: Secondary | ICD-10-CM | POA: Diagnosis not present

## 2015-02-07 DIAGNOSIS — Z96652 Presence of left artificial knee joint: Secondary | ICD-10-CM | POA: Diagnosis not present

## 2015-03-06 DIAGNOSIS — Z7901 Long term (current) use of anticoagulants: Secondary | ICD-10-CM | POA: Diagnosis not present

## 2015-03-06 DIAGNOSIS — I4891 Unspecified atrial fibrillation: Secondary | ICD-10-CM | POA: Diagnosis not present

## 2015-03-08 DIAGNOSIS — L309 Dermatitis, unspecified: Secondary | ICD-10-CM | POA: Diagnosis not present

## 2015-03-08 DIAGNOSIS — L821 Other seborrheic keratosis: Secondary | ICD-10-CM | POA: Diagnosis not present

## 2015-03-08 DIAGNOSIS — C44319 Basal cell carcinoma of skin of other parts of face: Secondary | ICD-10-CM | POA: Diagnosis not present

## 2015-03-08 DIAGNOSIS — D1801 Hemangioma of skin and subcutaneous tissue: Secondary | ICD-10-CM | POA: Diagnosis not present

## 2015-03-08 DIAGNOSIS — C44310 Basal cell carcinoma of skin of unspecified parts of face: Secondary | ICD-10-CM | POA: Diagnosis not present

## 2015-03-22 DIAGNOSIS — M5136 Other intervertebral disc degeneration, lumbar region: Secondary | ICD-10-CM | POA: Diagnosis not present

## 2015-03-22 DIAGNOSIS — M545 Low back pain: Secondary | ICD-10-CM | POA: Diagnosis not present

## 2015-03-22 DIAGNOSIS — S8002XD Contusion of left knee, subsequent encounter: Secondary | ICD-10-CM | POA: Diagnosis not present

## 2015-03-22 DIAGNOSIS — M25552 Pain in left hip: Secondary | ICD-10-CM | POA: Diagnosis not present

## 2015-04-03 ENCOUNTER — Emergency Department (HOSPITAL_COMMUNITY)
Admission: EM | Admit: 2015-04-03 | Discharge: 2015-04-03 | Disposition: A | Discharge status: Home / Self Care | Payer: Medicare Other | Attending: Emergency Medicine | Admitting: Emergency Medicine

## 2015-04-03 ENCOUNTER — Encounter (HOSPITAL_COMMUNITY): Payer: Self-pay | Admitting: Nurse Practitioner

## 2015-04-03 ENCOUNTER — Emergency Department (HOSPITAL_COMMUNITY): Payer: Medicare Other

## 2015-04-03 DIAGNOSIS — K219 Gastro-esophageal reflux disease without esophagitis: Secondary | ICD-10-CM | POA: Insufficient documentation

## 2015-04-03 DIAGNOSIS — Y9289 Other specified places as the place of occurrence of the external cause: Secondary | ICD-10-CM | POA: Insufficient documentation

## 2015-04-03 DIAGNOSIS — S01511A Laceration without foreign body of lip, initial encounter: Secondary | ICD-10-CM

## 2015-04-03 DIAGNOSIS — Y998 Other external cause status: Secondary | ICD-10-CM | POA: Diagnosis not present

## 2015-04-03 DIAGNOSIS — Z79899 Other long term (current) drug therapy: Secondary | ICD-10-CM | POA: Diagnosis not present

## 2015-04-03 DIAGNOSIS — S0001XA Abrasion of scalp, initial encounter: Secondary | ICD-10-CM

## 2015-04-03 DIAGNOSIS — R51 Headache: Secondary | ICD-10-CM | POA: Diagnosis not present

## 2015-04-03 DIAGNOSIS — I10 Essential (primary) hypertension: Secondary | ICD-10-CM | POA: Insufficient documentation

## 2015-04-03 DIAGNOSIS — W01198A Fall on same level from slipping, tripping and stumbling with subsequent striking against other object, initial encounter: Secondary | ICD-10-CM | POA: Insufficient documentation

## 2015-04-03 DIAGNOSIS — S0993XA Unspecified injury of face, initial encounter: Secondary | ICD-10-CM | POA: Diagnosis present

## 2015-04-03 DIAGNOSIS — Y9389 Activity, other specified: Secondary | ICD-10-CM | POA: Insufficient documentation

## 2015-04-03 DIAGNOSIS — S199XXA Unspecified injury of neck, initial encounter: Secondary | ICD-10-CM | POA: Diagnosis not present

## 2015-04-03 DIAGNOSIS — S0003XA Contusion of scalp, initial encounter: Secondary | ICD-10-CM | POA: Diagnosis not present

## 2015-04-03 DIAGNOSIS — D649 Anemia, unspecified: Secondary | ICD-10-CM | POA: Insufficient documentation

## 2015-04-03 DIAGNOSIS — S80212A Abrasion, left knee, initial encounter: Secondary | ICD-10-CM | POA: Diagnosis not present

## 2015-04-03 DIAGNOSIS — I4891 Unspecified atrial fibrillation: Secondary | ICD-10-CM | POA: Diagnosis not present

## 2015-04-03 DIAGNOSIS — M1990 Unspecified osteoarthritis, unspecified site: Secondary | ICD-10-CM | POA: Insufficient documentation

## 2015-04-03 DIAGNOSIS — W19XXXA Unspecified fall, initial encounter: Secondary | ICD-10-CM

## 2015-04-03 DIAGNOSIS — Z7901 Long term (current) use of anticoagulants: Secondary | ICD-10-CM | POA: Diagnosis not present

## 2015-04-03 LAB — PROTIME-INR
INR: 2.3 — AB (ref 0.00–1.49)
PROTHROMBIN TIME: 25.5 s — AB (ref 11.6–15.2)

## 2015-04-03 MED ORDER — LIDOCAINE-EPINEPHRINE (PF) 2 %-1:200000 IJ SOLN
10.0000 mL | Freq: Once | INTRAMUSCULAR | Status: AC
  Administered 2015-04-03: 10 mL
  Filled 2015-04-03: qty 20

## 2015-04-03 NOTE — ED Provider Notes (Signed)
CSN: [id number]     Arrival date & time 04/03/15  1426 History   First MD Initiated Contact with Patient 04/03/15 1700     Chief Complaint  Patient presents with  . Fall     Patient is a 81 y.o. female presenting with fall. The history is provided by the patient. No language interpreter was used.  Fall   Ms. Bensman presents for evaluation of injuries following a fall.  She tripped over her cane around 145pm and landed face first on asphalt.  No LOC, nausea, dizziness.  She has pain in her face and left knee.  She denies any associated sxs.    Past Medical History  Diagnosis Date  . Arthritis   . GERD (gastroesophageal reflux disease)   . Hypertension   . Dysrhythmia     hx brief AF post op hip surg  . Anemia    Past Surgical History  Procedure Laterality Date  . Total knee arthroplasty  2010,2012    left  . Total hip arthroplasty  2006    left-fx  . Total knee arthroplasty  2009    right  . Appendectomy    . Tonsillectomy    . Eye surgery      both cataracts  . Colonoscopy    . Incision and drainage  2011    left hip inf-hemovac  . Neck mass excision    . Knee arthroscopy  1995    left  . Amputation Right 02/04/2013    Procedure: [name] ;  Surgeon: Wylene Simmer, MD;  Location: [facility];  Service: Orthopedics;  Laterality: Right;   History reviewed. No pertinent family history. History  Substance Use Topics  . Smoking status: Never Smoker   . Smokeless tobacco: Not on file  . Alcohol Use: No   OB History    No data available     Review of Systems  All other systems reviewed and are negative.     Allergies  Ceftriaxone sodium; Sulfonamide derivatives; and Vancomycin hcl  Home Medications   Prior to Admission medications   Medication Sig Start Date End Date Taking? Authorizing Provider  Alum & Mag Hydroxide-Simeth (MAALOX REGULAR STRENGTH) 225-200-25 MG/5ML SUSP Take by mouth as directed. Per PCP     Historical  Provider, MD  calcium carbonate (CALCIUM 500) 1250 MG tablet Take by mouth as directed. Per PCP     Historical Provider, MD  cyanocobalamin 1000 MCG tablet Take 100 mcg by mouth daily.    Historical Provider, MD  Ferrous Sulfate (IRON) 325 (65 FE) MG TABS Take by mouth as directed. Per PCP     Historical Provider, MD  HYDROcodone-acetaminophen (NORCO) 5-325 MG per tablet Take 1 tablet by mouth every 6 (six) hours as needed for pain. 02/04/13   Wylene Simmer, MD  metoprolol succinate (TOPROL-XL) 25 MG 24 hr tablet Take 25 mg by mouth every evening.     Historical Provider, MD  omeprazole (PRILOSEC) 20 MG capsule Take 20 mg by mouth daily.    Historical Provider, MD  warfarin (COUMADIN) 1 MG tablet Take 1 mg by mouth as directed.      Historical Provider, MD   BP 189/106 mmHg  Pulse 94  Temp(Src) 97.9 F (36.6 C) (Oral)  Resp 18  SpO2 99% Physical Exam  Constitutional: She is oriented to person, place, and time. She appears well-developed and well-nourished.  HENT:  Head: Normocephalic.  Abrasion over right forehead, chin, above upper lip.  Lower lip laceration, does not cross vermillion border.   Cardiovascular: Normal rate and regular rhythm.   No murmur heard. Pulmonary/Chest: Effort normal and breath sounds normal. No respiratory distress.  Abdominal: Soft. There is no tenderness. There is no rebound and no guarding.  Musculoskeletal: She exhibits no edema or tenderness.  Abrasion over left knee with no appreciable tenderness.  FROM present in bilateral hips, knees.  No upper extremity tenderness to palpation.  No C spine tenderness to palpation.    Neurological: She is alert and oriented to person, place, and time.  Skin: Skin is warm and dry.  Psychiatric: She has a normal mood and affect. Her behavior is normal.  Nursing note and vitals reviewed.   ED Course  Procedures (including critical care time) Labs Review Labs Reviewed  PROTIME-INR - Abnormal; Notable for the following:     Prothrombin Time 25.5 (*)    INR 2.30 (*)    All other components within normal limits    Imaging Review Ct Head Wo Contrast  04/03/2015   CLINICAL DATA:  Initial encounter for tripped and fell today and hit face on asphalt  EXAM: CT HEAD WITHOUT CONTRAST  CT MAXILLOFACIAL WITHOUT CONTRAST  CT CERVICAL SPINE WITHOUT CONTRAST  TECHNIQUE: Multidetector CT imaging of the head, cervical spine, and maxillofacial structures were performed using the standard protocol without intravenous contrast. Multiplanar CT image reconstructions of the cervical spine and maxillofacial structures were also generated.  COMPARISON:  Had and cervical spine CT from 07/10/2010.  FINDINGS: CT HEAD FINDINGS  There is no evidence for acute hemorrhage, hydrocephalus, mass lesion, or abnormal extra-axial fluid collection. No definite CT evidence for acute infarction. Diffuse loss of parenchymal volume is consistent with atrophy. Opacification of the right frontal sinus noted.  CT MAXILLOFACIAL FINDINGS  The temporomandibular joints are located. No evidence for mandible fracture. No maxillary sinus fracture. The zygomatic arches are intact.  There is material in the right frontal sinus of increased attenuation, but this does not appear to be an air-fluid level. The patient has a scalp contusion directly over the right frontal sinus, but no fracture of the right frontal bone is evident in any plane.  The globes are symmetric in size and shape. The patient is status post bilateral lens replacement surgery. No evidence for medial or inferior orbital wall blowout fracture.  CT CERVICAL SPINE FINDINGS  Imaging was obtained from the skullbase through the T3 vertebral body. There is no evidence for an acute fracture. Trace anterolisthesis of C4 on 5 and C5 on 6 is compatible with the facet degeneration at these levels. Normal cervical lordosis is preserved. There is no prevertebral soft tissue edema.  Diffuse facet osteoarthritis is seen  bilaterally. Mild loss of disc height is seen diffusely in the cervical spine.  IMPRESSION: 1. Right frontal scalp contusion without evidence for underlying skull fracture. 2. No acute intracranial abnormality. 3. No evidence for an acute fracture involving the bony face. 4. Diffuse degenerative changes in the cervical spine without acute fracture.   Electronically Signed   By: Misty Stanley M.D.   On: 04/03/2015 17:40   Ct Cervical Spine Wo Contrast  04/03/2015   CLINICAL DATA:  Initial encounter for tripped and fell today and hit face on asphalt  EXAM: CT HEAD WITHOUT CONTRAST  CT MAXILLOFACIAL WITHOUT CONTRAST  CT CERVICAL SPINE WITHOUT CONTRAST  TECHNIQUE: Multidetector CT imaging of the head, cervical spine, and maxillofacial structures were performed using the standard protocol without intravenous contrast. Multiplanar  CT image reconstructions of the cervical spine and maxillofacial structures were also generated.  COMPARISON:  Had and cervical spine CT from 07/10/2010.  FINDINGS: CT HEAD FINDINGS  There is no evidence for acute hemorrhage, hydrocephalus, mass lesion, or abnormal extra-axial fluid collection. No definite CT evidence for acute infarction. Diffuse loss of parenchymal volume is consistent with atrophy. Opacification of the right frontal sinus noted.  CT MAXILLOFACIAL FINDINGS  The temporomandibular joints are located. No evidence for mandible fracture. No maxillary sinus fracture. The zygomatic arches are intact.  There is material in the right frontal sinus of increased attenuation, but this does not appear to be an air-fluid level. The patient has a scalp contusion directly over the right frontal sinus, but no fracture of the right frontal bone is evident in any plane.  The globes are symmetric in size and shape. The patient is status post bilateral lens replacement surgery. No evidence for medial or inferior orbital wall blowout fracture.  CT CERVICAL SPINE FINDINGS  Imaging was obtained  from the skullbase through the T3 vertebral body. There is no evidence for an acute fracture. Trace anterolisthesis of C4 on 5 and C5 on 6 is compatible with the facet degeneration at these levels. Normal cervical lordosis is preserved. There is no prevertebral soft tissue edema.  Diffuse facet osteoarthritis is seen bilaterally. Mild loss of disc height is seen diffusely in the cervical spine.  IMPRESSION: 1. Right frontal scalp contusion without evidence for underlying skull fracture. 2. No acute intracranial abnormality. 3. No evidence for an acute fracture involving the bony face. 4. Diffuse degenerative changes in the cervical spine without acute fracture.   Electronically Signed   By: Misty Stanley M.D.   On: 04/03/2015 17:40   Ct Maxillofacial Wo Cm  04/03/2015   CLINICAL DATA:  Initial encounter for tripped and fell today and hit face on asphalt  EXAM: CT HEAD WITHOUT CONTRAST  CT MAXILLOFACIAL WITHOUT CONTRAST  CT CERVICAL SPINE WITHOUT CONTRAST  TECHNIQUE: Multidetector CT imaging of the head, cervical spine, and maxillofacial structures were performed using the standard protocol without intravenous contrast. Multiplanar CT image reconstructions of the cervical spine and maxillofacial structures were also generated.  COMPARISON:  Had and cervical spine CT from 07/10/2010.  FINDINGS: CT HEAD FINDINGS  There is no evidence for acute hemorrhage, hydrocephalus, mass lesion, or abnormal extra-axial fluid collection. No definite CT evidence for acute infarction. Diffuse loss of parenchymal volume is consistent with atrophy. Opacification of the right frontal sinus noted.  CT MAXILLOFACIAL FINDINGS  The temporomandibular joints are located. No evidence for mandible fracture. No maxillary sinus fracture. The zygomatic arches are intact.  There is material in the right frontal sinus of increased attenuation, but this does not appear to be an air-fluid level. The patient has a scalp contusion directly over the  right frontal sinus, but no fracture of the right frontal bone is evident in any plane.  The globes are symmetric in size and shape. The patient is status post bilateral lens replacement surgery. No evidence for medial or inferior orbital wall blowout fracture.  CT CERVICAL SPINE FINDINGS  Imaging was obtained from the skullbase through the T3 vertebral body. There is no evidence for an acute fracture. Trace anterolisthesis of C4 on 5 and C5 on 6 is compatible with the facet degeneration at these levels. Normal cervical lordosis is preserved. There is no prevertebral soft tissue edema.  Diffuse facet osteoarthritis is seen bilaterally. Mild loss of disc height is seen  diffusely in the cervical spine.  IMPRESSION: 1. Right frontal scalp contusion without evidence for underlying skull fracture. 2. No acute intracranial abnormality. 3. No evidence for an acute fracture involving the bony face. 4. Diffuse degenerative changes in the cervical spine without acute fracture.   Electronically Signed   By: Misty Stanley M.D.   On: 04/03/2015 17:40     EKG Interpretation None      MDM   Final diagnoses:  Fall, initial encounter  Scalp abrasion, initial encounter  Lip laceration, initial encounter    Pt here for evaluation of injuries following a mechanical fall, on coumadin.  Pt has facial abrasions, lip laceration.  Lip lac repaired by PA per note.  No evidence of serious CHI on imaging. Pt able to ambulate in department without difficulty.  Discussed home wound care, pcp follow up, return precautions.      Quintella Reichert, MD 04/04/15 [phone]

## 2015-04-03 NOTE — ED Notes (Signed)
She tripped over cane and fell onto face on asphalt. She denies LOC. She has abrasions to Forehead, nose, chin, laceration to lower lip, bleeding from nostrils. She reports normal vision. She can not tell if her teeth are loose. She is on coumadin. She is A&Ox4

## 2015-04-03 NOTE — ED Provider Notes (Signed)
LACERATION REPAIR Performed by: Harlow Mares Authorized by: Harlow Mares Consent: Verbal consent obtained. Risks and benefits: risks, benefits and alternatives were discussed Consent given by: patient Patient identity confirmed: provided demographic data Prepped and Draped in normal sterile fashion Wound explored  Laceration Location: lower lip (no vermillion border involvement)  Laceration Length: 0.5 cm  No Foreign Bodies seen or palpated  Anesthesia: local infiltration  Local anesthetic: lidocaine 2% w/ epinephrine  Anesthetic total: 1 ml  Irrigation method: syringe Amount of cleaning: standard  Skin closure: 5-0 Vicryl Rapide  Number of sutures: 2  Technique: simple interrupted  Patient tolerance: Patient tolerated the procedure well with no immediate complications.   Baron Sane, PA-C 04/03/15 2016  Quintella Reichert, MD 04/03/15 [phone]

## 2015-04-03 NOTE — Discharge Instructions (Signed)
Your INR was 2.3 today.  Please get rechecked by your family doctor in the next 2-3 days.  Get rechecked sooner if you develop any new symptoms or new concerning symptoms.     Fall Prevention and Home Safety Falls cause injuries and can affect all age groups. It is possible to use preventive measures to significantly decrease the likelihood of falls. There are many simple measures which can make your home safer and prevent falls. OUTDOORS  Repair cracks and edges of walkways and driveways.  Remove high doorway thresholds.  Trim shrubbery on the main path into your home.  Have good outside lighting.  Clear walkways of tools, rocks, debris, and clutter.  Check that handrails are not broken and are securely fastened. Both sides of steps should have handrails.  Have leaves, snow, and ice cleared regularly.  Use sand or salt on walkways during winter months.  In the garage, clean up grease or oil spills. BATHROOM  Install night lights.  Install grab bars by the toilet and in the tub and shower.  Use non-skid mats or decals in the tub or shower.  Place a plastic non-slip stool in the shower to sit on, if needed.  Keep floors dry and clean up all water on the floor immediately.  Remove soap buildup in the tub or shower on a regular basis.  Secure bath mats with non-slip, double-sided rug tape.  Remove throw rugs and tripping hazards from the floors. BEDROOMS  Install night lights.  Make sure a bedside light is easy to reach.  Do not use oversized bedding.  Keep a telephone by your bedside.  Have a firm chair with side arms to use for getting dressed.  Remove throw rugs and tripping hazards from the floor. KITCHEN  Keep handles on pots and pans turned toward the center of the stove. Use back burners when possible.  Clean up spills quickly and allow time for drying.  Avoid walking on wet floors.  Avoid hot utensils and knives.  Position shelves so they are not  too high or low.  Place commonly used objects within easy reach.  If necessary, use a sturdy step stool with a grab bar when reaching.  Keep electrical cables out of the way.  Do not use floor polish or wax that makes floors slippery. If you must use wax, use non-skid floor wax.  Remove throw rugs and tripping hazards from the floor. STAIRWAYS  Never leave objects on stairs.  Place handrails on both sides of stairways and use them. Fix any loose handrails. Make sure handrails on both sides of the stairways are as long as the stairs.  Check carpeting to make sure it is firmly attached along stairs. Make repairs to worn or loose carpet promptly.  Avoid placing throw rugs at the top or bottom of stairways, or properly secure the rug with carpet tape to prevent slippage. Get rid of throw rugs, if possible.  Have an electrician put in a light switch at the top and bottom of the stairs. OTHER FALL PREVENTION TIPS  Wear low-heel or rubber-soled shoes that are supportive and fit well. Wear closed toe shoes.  When using a stepladder, make sure it is fully opened and both spreaders are firmly locked. Do not climb a closed stepladder.  Add color or contrast paint or tape to grab bars and handrails in your home. Place contrasting color strips on first and last steps.  Learn and use mobility aids as needed. Install an Dealer  emergency response system.  Turn on lights to avoid dark areas. Replace light bulbs that burn out immediately. Get light switches that glow.  Arrange furniture to create clear pathways. Keep furniture in the same place.  Firmly attach carpet with non-skid or double-sided tape.  Eliminate uneven floor surfaces.  Select a carpet pattern that does not visually hide the edge of steps.  Be aware of all pets. OTHER HOME SAFETY TIPS  Set the water temperature for 120 F (48.8 C).  Keep emergency numbers on or near the telephone.  Keep smoke detectors on every  level of the home and near sleeping areas. Document Released: 10/25/2002 Document Revised: 05/05/2012 Document Reviewed: 01/24/2012 [facility] Patient Information 2015 [place], [place]. This information is not intended to replace advice given to you by your health care provider. Make sure you discuss any questions you have with your health care provider.

## 2015-04-03 NOTE — ED Notes (Signed)
PA at bedside.

## 2015-04-07 DIAGNOSIS — Z7901 Long term (current) use of anticoagulants: Secondary | ICD-10-CM | POA: Diagnosis not present

## 2015-04-07 DIAGNOSIS — I4891 Unspecified atrial fibrillation: Secondary | ICD-10-CM | POA: Diagnosis not present

## 2015-04-07 DIAGNOSIS — W1830XA Fall on same level, unspecified, initial encounter: Secondary | ICD-10-CM | POA: Diagnosis not present

## 2015-04-10 DIAGNOSIS — C4401 Basal cell carcinoma of skin of lip: Secondary | ICD-10-CM | POA: Diagnosis not present

## 2015-05-03 DIAGNOSIS — M545 Low back pain: Secondary | ICD-10-CM | POA: Diagnosis not present

## 2015-05-03 DIAGNOSIS — M5136 Other intervertebral disc degeneration, lumbar region: Secondary | ICD-10-CM | POA: Diagnosis not present

## 2015-05-12 DIAGNOSIS — I4891 Unspecified atrial fibrillation: Secondary | ICD-10-CM | POA: Diagnosis not present

## 2015-05-12 DIAGNOSIS — Z7901 Long term (current) use of anticoagulants: Secondary | ICD-10-CM | POA: Diagnosis not present

## 2015-06-02 DIAGNOSIS — I4891 Unspecified atrial fibrillation: Secondary | ICD-10-CM | POA: Diagnosis not present

## 2015-06-02 DIAGNOSIS — Z7901 Long term (current) use of anticoagulants: Secondary | ICD-10-CM | POA: Diagnosis not present

## 2015-06-26 DIAGNOSIS — E1151 Type 2 diabetes mellitus with diabetic peripheral angiopathy without gangrene: Secondary | ICD-10-CM | POA: Diagnosis not present

## 2015-06-26 DIAGNOSIS — Z7901 Long term (current) use of anticoagulants: Secondary | ICD-10-CM | POA: Diagnosis not present

## 2015-06-26 DIAGNOSIS — M545 Low back pain: Secondary | ICD-10-CM | POA: Diagnosis not present

## 2015-06-26 DIAGNOSIS — I739 Peripheral vascular disease, unspecified: Secondary | ICD-10-CM | POA: Diagnosis not present

## 2015-06-26 DIAGNOSIS — I4891 Unspecified atrial fibrillation: Secondary | ICD-10-CM | POA: Diagnosis not present

## 2015-06-26 DIAGNOSIS — Z1389 Encounter for screening for other disorder: Secondary | ICD-10-CM | POA: Diagnosis not present

## 2015-06-26 DIAGNOSIS — R269 Unspecified abnormalities of gait and mobility: Secondary | ICD-10-CM | POA: Diagnosis not present

## 2015-06-26 DIAGNOSIS — I10 Essential (primary) hypertension: Secondary | ICD-10-CM | POA: Diagnosis not present

## 2015-06-26 DIAGNOSIS — Z6823 Body mass index (BMI) 23.0-23.9, adult: Secondary | ICD-10-CM | POA: Diagnosis not present

## 2015-07-27 DIAGNOSIS — Z7901 Long term (current) use of anticoagulants: Secondary | ICD-10-CM | POA: Diagnosis not present

## 2015-07-27 DIAGNOSIS — I4891 Unspecified atrial fibrillation: Secondary | ICD-10-CM | POA: Diagnosis not present

## 2015-08-09 ENCOUNTER — Ambulatory Visit (INDEPENDENT_AMBULATORY_CARE_PROVIDER_SITE_OTHER): Payer: Medicare Other

## 2015-08-09 ENCOUNTER — Ambulatory Visit (INDEPENDENT_AMBULATORY_CARE_PROVIDER_SITE_OTHER): Payer: Medicare Other | Admitting: Podiatry

## 2015-08-09 DIAGNOSIS — I739 Peripheral vascular disease, unspecified: Secondary | ICD-10-CM | POA: Diagnosis not present

## 2015-08-09 DIAGNOSIS — S93602A Unspecified sprain of left foot, initial encounter: Secondary | ICD-10-CM

## 2015-08-09 DIAGNOSIS — L84 Corns and callosities: Secondary | ICD-10-CM | POA: Diagnosis not present

## 2015-08-09 DIAGNOSIS — M2040 Other hammer toe(s) (acquired), unspecified foot: Secondary | ICD-10-CM

## 2015-08-09 NOTE — Progress Notes (Signed)
   Subjective:    Patient ID: [name], female    DOB: 08/29/1928, 81 y.o.   MRN: [id number]  HPI  Pt presents with painful corn on her right foot interdigital 1st and 2nd. She also presents with redness, swelling and warmth on left foot for which she states she injured 2 weeks previous. She c/o soreness in that foot with swelling on dorsal aspect  Review of Systems  All other systems reviewed and are negative.      Objective:   Physical Exam        Assessment & Plan:

## 2015-08-09 NOTE — Progress Notes (Signed)
Subjective:     Patient ID: [name], female   DOB: [birth year], 81 y.o.   MRN: [id number]  HPI patient presents after developing a lesion on the distal third digit left foot that's painful with some breakdown of tissue and a lesion between the big toe and second toe right foot. States that she does have cramping in her lower legs and pain with ambulation   Review of Systems  All other systems reviewed and are negative.      Objective:   Physical Exam  Constitutional: She is oriented to person, place, and time.  Musculoskeletal: Normal range of motion.  Neurological: She is oriented to person, place, and time.  Skin: Skin is warm and dry.  Nursing note and vitals reviewed.  patient is found to have significant loss of PT pulses both DP and PT as indicated and does have diminished hair growth. She has capillary fill approximate 4 seconds bilateral and she does have a lesion on the distal third toe left that is keratotic with a lowering of the toe and also a small dry ulceration between the hallux and second toe right with no proximal edema erythema or drainage noted. She has lost her fifth toe on the right foot secondary to infection and probably circulatory issues     Assessment:     Condition which I believe is more due to circulatory issues along with structural digital deformity    Plan:     H&P and education considered circulatory issues reviewed with patient. I went ahead and I'm sending for circulatory studies at this time for possible revascularization and I debrided lesions left and applied buttress pad and for the right I applied padding between the big toe and second toe to take pressure off of this. She may ultimately require amputation depending on results circulatory study and I made her aware of that today

## 2015-08-10 DIAGNOSIS — Z961 Presence of intraocular lens: Secondary | ICD-10-CM | POA: Diagnosis not present

## 2015-08-17 DIAGNOSIS — I4891 Unspecified atrial fibrillation: Secondary | ICD-10-CM | POA: Diagnosis not present

## 2015-08-17 DIAGNOSIS — Z7901 Long term (current) use of anticoagulants: Secondary | ICD-10-CM | POA: Diagnosis not present

## 2015-08-17 DIAGNOSIS — Z23 Encounter for immunization: Secondary | ICD-10-CM | POA: Diagnosis not present

## 2015-08-28 ENCOUNTER — Encounter: Payer: Self-pay | Admitting: Podiatry

## 2015-08-28 ENCOUNTER — Ambulatory Visit (INDEPENDENT_AMBULATORY_CARE_PROVIDER_SITE_OTHER): Payer: Medicare Other | Admitting: Podiatry

## 2015-08-28 ENCOUNTER — Telehealth: Payer: Self-pay | Admitting: *Deleted

## 2015-08-28 VITALS — BP 160/92 | HR 81 | Resp 16

## 2015-08-28 DIAGNOSIS — I739 Peripheral vascular disease, unspecified: Secondary | ICD-10-CM

## 2015-08-28 DIAGNOSIS — L84 Corns and callosities: Secondary | ICD-10-CM

## 2015-08-28 DIAGNOSIS — M79606 Pain in leg, unspecified: Secondary | ICD-10-CM

## 2015-08-28 DIAGNOSIS — B351 Tinea unguium: Secondary | ICD-10-CM

## 2015-08-29 NOTE — Telephone Encounter (Signed)
Informed pt by phone and mail of the 09/05/2015 @ 230pm appt with [place] at [facility].

## 2015-08-30 ENCOUNTER — Other Ambulatory Visit: Payer: Self-pay | Admitting: Podiatry

## 2015-08-30 DIAGNOSIS — L97309 Non-pressure chronic ulcer of unspecified ankle with unspecified severity: Secondary | ICD-10-CM

## 2015-08-30 DIAGNOSIS — I739 Peripheral vascular disease, unspecified: Secondary | ICD-10-CM

## 2015-08-31 NOTE — Progress Notes (Signed)
Subjective:     Patient ID: [name], female   DOB: December 06, 1927, 81 y.o.   MRN: [id number]  HPI patient presents stating I have these irritated points on both my feet and I am supposed to be getting my circulation checked. Also states the nails are getting increasingly thick and make it hard to walk and wear shoe gear comfortably   Review of Systems     Objective:   Physical Exam  Neurovascular status unchanged but vascular status found to be diminished both DP PT pulses with keratotic lesion second digit right fifth digit left that are painful when pressed that are not draining currently and there is thick yellow brittle nailbeds 1-5 both feet that are painful    Assessment:     Patient does have diminished circulatory status that we are working to get her to appointment to have checked and has lesion formation and nail disease that's very painful    Plan:     Debrided nailbeds 1-5 both feet and lesions both feet with no iatrogenic bleeding noted

## 2015-09-05 ENCOUNTER — Ambulatory Visit (HOSPITAL_COMMUNITY)
Admission: RE | Admit: 2015-09-05 | Discharge: 2015-09-05 | Disposition: A | Discharge status: Home / Self Care | Payer: Medicare Other | Source: Ambulatory Visit | Attending: Podiatry | Admitting: Podiatry

## 2015-09-05 ENCOUNTER — Other Ambulatory Visit: Payer: Self-pay | Admitting: Podiatry

## 2015-09-05 DIAGNOSIS — I10 Essential (primary) hypertension: Secondary | ICD-10-CM | POA: Diagnosis not present

## 2015-09-05 DIAGNOSIS — Z89421 Acquired absence of other right toe(s): Secondary | ICD-10-CM | POA: Insufficient documentation

## 2015-09-05 DIAGNOSIS — L97529 Non-pressure chronic ulcer of other part of left foot with unspecified severity: Secondary | ICD-10-CM | POA: Insufficient documentation

## 2015-09-05 DIAGNOSIS — L97309 Non-pressure chronic ulcer of unspecified ankle with unspecified severity: Secondary | ICD-10-CM

## 2015-09-05 DIAGNOSIS — I739 Peripheral vascular disease, unspecified: Secondary | ICD-10-CM | POA: Diagnosis not present

## 2015-09-12 ENCOUNTER — Ambulatory Visit: Payer: Medicare Other | Admitting: Cardiovascular Disease

## 2015-09-28 DIAGNOSIS — Z1231 Encounter for screening mammogram for malignant neoplasm of breast: Secondary | ICD-10-CM | POA: Diagnosis not present

## 2015-09-29 ENCOUNTER — Encounter: Payer: Self-pay | Admitting: *Deleted

## 2015-09-29 DIAGNOSIS — I10 Essential (primary) hypertension: Secondary | ICD-10-CM | POA: Diagnosis not present

## 2015-09-29 DIAGNOSIS — E785 Hyperlipidemia, unspecified: Secondary | ICD-10-CM | POA: Diagnosis not present

## 2015-09-29 DIAGNOSIS — R8299 Other abnormal findings in urine: Secondary | ICD-10-CM | POA: Diagnosis not present

## 2015-09-29 DIAGNOSIS — E1151 Type 2 diabetes mellitus with diabetic peripheral angiopathy without gangrene: Secondary | ICD-10-CM | POA: Diagnosis not present

## 2015-09-29 DIAGNOSIS — N390 Urinary tract infection, site not specified: Secondary | ICD-10-CM | POA: Diagnosis not present

## 2015-09-29 DIAGNOSIS — L84 Corns and callosities: Secondary | ICD-10-CM

## 2015-10-03 ENCOUNTER — Encounter: Payer: Self-pay | Admitting: Cardiovascular Disease

## 2015-10-03 ENCOUNTER — Ambulatory Visit (INDEPENDENT_AMBULATORY_CARE_PROVIDER_SITE_OTHER): Payer: Medicare Other | Admitting: Cardiovascular Disease

## 2015-10-03 VITALS — BP 162/98 | HR 81 | Ht 63.0 in | Wt 117.4 lb

## 2015-10-03 DIAGNOSIS — L97519 Non-pressure chronic ulcer of other part of right foot with unspecified severity: Secondary | ICD-10-CM | POA: Diagnosis not present

## 2015-10-03 DIAGNOSIS — I739 Peripheral vascular disease, unspecified: Secondary | ICD-10-CM | POA: Diagnosis not present

## 2015-10-03 DIAGNOSIS — L97529 Non-pressure chronic ulcer of other part of left foot with unspecified severity: Secondary | ICD-10-CM

## 2015-10-03 DIAGNOSIS — I482 Chronic atrial fibrillation, unspecified: Secondary | ICD-10-CM

## 2015-10-03 NOTE — Patient Instructions (Signed)
Medication Instructions:  No changes today  Labwork: None today  Testing/Procedures: None today  Follow-Up: Your physician recommends that you schedule a follow-up appointment in: 2 months with Dr Fletcher Anon.   You have been referred to the [facility] to evaluate the ulcers on your feet.        If you need a refill on your cardiac medications before your next appointment, please call your pharmacy.

## 2015-10-05 DIAGNOSIS — S93326A Dislocation of tarsometatarsal joint of unspecified foot, initial encounter: Secondary | ICD-10-CM | POA: Insufficient documentation

## 2015-10-05 NOTE — Progress Notes (Signed)
HPI  This is an 81 year old female who was referred for evaluation of foot ulcers and peripheral arterial disease. She has known history of chronic atrial fibrillation on long-term anticoagulation with warfarin, hypertension, anemia and arthritis. She does not have diabetes and she is not a smoker. Her physical mobility is very limited and she walks with a cane and most of the time with a walker. Over the last 3 months, she small ulcers on the bottom of the third left toe and a small ulcer on the right second toe. These has been slow to heal. She was seen by Dr. Paulla Dolly in podiatry where she was noted to have weak pulses and cold feet. She had noninvasive vascular evaluation which showed normal ABI bilaterally with mildly decreased left toe pressure. ABI could have been falsely elevated due to medial calcification. Duplex showed no significant aortoiliac disease. There was moderate left SFA/popliteal artery disease with three-vessel runoff below the knee. There was one-vessel runoff below the knee on the right via the anterior tibial artery. She had previous amputation of the right small toe.   Allergies  Allergen Reactions  . Ceftriaxone Sodium     REACTION: tinly feeling after given, then one hour later while vanco infusing pruritic rash  . Sulfonamide Derivatives   . Vancomycin Hcl     REACTION: rash happened 1hr after rocephin but immediately after infusing vancomycin     Current Outpatient Prescriptions on File Prior to Visit  Medication Sig Dispense Refill  . calcium carbonate (CALCIUM 500) 1250 MG tablet Take 1 tablet by mouth daily with breakfast.     . Ferrous Sulfate (IRON) 325 (65 FE) MG TABS Take 1 tablet by mouth daily.     Marland Kitchen gabapentin (NEURONTIN) 100 MG capsule Take 100 mg by mouth daily.    . metoprolol succinate (TOPROL-XL) 25 MG 24 hr tablet Take 25 mg by mouth every evening.     . warfarin (COUMADIN) 1 MG tablet Take 1 mg by mouth as directed.       No current  facility-administered medications on file prior to visit.     Past Medical History  Diagnosis Date  . Arthritis   . GERD (gastroesophageal reflux disease)   . Hypertension   . Dysrhythmia     hx brief AF post op hip surg  . Anemia      Past Surgical History  Procedure Laterality Date  . Total knee arthroplasty  2010,2012    left  . Total hip arthroplasty  2006    left-fx  . Total knee arthroplasty  2009    right  . Appendectomy    . Tonsillectomy    . Eye surgery      both cataracts  . Colonoscopy    . Incision and drainage  2011    left hip inf-hemovac  . Neck mass excision    . Knee arthroscopy  1995    left  . Amputation Right 02/04/2013    Procedure: [name] ;  Surgeon: Wylene Simmer, MD;  Location: [facility];  Service: Orthopedics;  Laterality: Right;     Family History  Problem Relation Age of Onset  . Cancer Mother      Social History   Social History  . Marital Status: Married    Spouse Name: N/A  . Number of Children: N/A  . Years of Education: N/A   Occupational History  . Not on file.   Social History Main Topics  .  Smoking status: Never Smoker   . Smokeless tobacco: Not on file  . Alcohol Use: No  . Drug Use: No  . Sexual Activity: Not on file   Other Topics Concern  . Not on file   Social History Narrative     ROS A 10 point review of system was performed. It is negative other than that mentioned in the history of present illness.   PHYSICAL EXAM   BP 162/98 mmHg  Pulse 81  Ht 5\' 3"  (1.6 m)  Wt 117 lb 6.4 oz (53.252 kg)  BMI 20.80 kg/m2 Constitutional: She is oriented to person, place, and time. She appears well-developed and well-nourished. No distress.  HENT: No nasal discharge.  Head: Normocephalic and atraumatic.  Eyes: Pupils are equal and round. No discharge.  Neck: Normal range of motion. Neck supple. No JVD present. No thyromegaly present.  Cardiovascular: Normal rate, regular  rhythm, normal heart sounds. Exam reveals no gallop and no friction rub. No murmur heard.  Pulmonary/Chest: Effort normal and breath sounds normal. No stridor. No respiratory distress. She has no wheezes. She has no rales. She exhibits no tenderness.  Abdominal: Soft. Bowel sounds are normal. She exhibits no distension. There is no tenderness. There is no rebound and no guarding.  Musculoskeletal: Normal range of motion. She exhibits no edema and no tenderness.  Neurological: She is alert and oriented to person, place, and time. Coordination normal.  Skin: Skin is warm and dry. No rash noted. She is not diaphoretic. No erythema. No pallor.  Psychiatric: She has a normal mood and affect. Her behavior is normal. Judgment and thought content normal.  Vascular: Femoral pulses are normal bilaterally. Distal pulses are not palpable with cold feet. The right small toe is amputated. 2 small ulcers are noted as described above.   EKG: atrial fibrillation with left anterior fascicular block   ASSESSMENT AND PLAN

## 2015-10-05 NOTE — Assessment & Plan Note (Signed)
The patient's foot ulcer are likely multifactorial. She does have a component of neuropathy which might be contributing. None noninvasive vascular evaluation was abnormal but not severe enough to explain these. Nonetheless, small vessel disease cannot be excluded on the basis of ABI and duplex study. Considering her age, poor functional capacity and comorbidities, I favor attempting conservative management with wound care before considering angiography and endovascular intervention. If there is no improvement with conservative management, then I recommend proceeding with angiography. Please note that the ulcers have been stable over the last few months with no progression.

## 2015-10-05 NOTE — Assessment & Plan Note (Signed)
Ventricular rate is controlled and she is on anticoagulation.

## 2015-10-06 DIAGNOSIS — Z1389 Encounter for screening for other disorder: Secondary | ICD-10-CM | POA: Diagnosis not present

## 2015-10-06 DIAGNOSIS — R269 Unspecified abnormalities of gait and mobility: Secondary | ICD-10-CM | POA: Diagnosis not present

## 2015-10-06 DIAGNOSIS — M810 Age-related osteoporosis without current pathological fracture: Secondary | ICD-10-CM | POA: Diagnosis not present

## 2015-10-06 DIAGNOSIS — Z7901 Long term (current) use of anticoagulants: Secondary | ICD-10-CM | POA: Diagnosis not present

## 2015-10-06 DIAGNOSIS — E784 Other hyperlipidemia: Secondary | ICD-10-CM | POA: Diagnosis not present

## 2015-10-06 DIAGNOSIS — E11621 Type 2 diabetes mellitus with foot ulcer: Secondary | ICD-10-CM | POA: Diagnosis not present

## 2015-10-06 DIAGNOSIS — I4891 Unspecified atrial fibrillation: Secondary | ICD-10-CM | POA: Diagnosis not present

## 2015-10-06 DIAGNOSIS — Z0000 Encounter for general adult medical examination without abnormal findings: Secondary | ICD-10-CM | POA: Diagnosis not present

## 2015-10-06 DIAGNOSIS — E1151 Type 2 diabetes mellitus with diabetic peripheral angiopathy without gangrene: Secondary | ICD-10-CM | POA: Diagnosis not present

## 2015-10-06 DIAGNOSIS — I739 Peripheral vascular disease, unspecified: Secondary | ICD-10-CM | POA: Diagnosis not present

## 2015-10-06 DIAGNOSIS — Z6822 Body mass index (BMI) 22.0-22.9, adult: Secondary | ICD-10-CM | POA: Diagnosis not present

## 2015-10-06 DIAGNOSIS — R0609 Other forms of dyspnea: Secondary | ICD-10-CM | POA: Diagnosis not present

## 2015-10-16 ENCOUNTER — Encounter: Payer: Self-pay | Admitting: Cardiovascular Disease

## 2015-10-16 NOTE — Telephone Encounter (Signed)
New message ° ° ° ° ° °Returning a nurses call °

## 2015-10-16 NOTE — Telephone Encounter (Signed)
This encounter was created in error - please disregard.

## 2015-10-25 DIAGNOSIS — L84 Corns and callosities: Secondary | ICD-10-CM | POA: Diagnosis not present

## 2015-10-25 DIAGNOSIS — L309 Dermatitis, unspecified: Secondary | ICD-10-CM | POA: Diagnosis not present

## 2015-10-25 DIAGNOSIS — Z08 Encounter for follow-up examination after completed treatment for malignant neoplasm: Secondary | ICD-10-CM | POA: Diagnosis not present

## 2015-10-25 DIAGNOSIS — Z85828 Personal history of other malignant neoplasm of skin: Secondary | ICD-10-CM | POA: Diagnosis not present

## 2015-11-07 ENCOUNTER — Encounter (HOSPITAL_BASED_OUTPATIENT_CLINIC_OR_DEPARTMENT_OTHER): Payer: Medicare Other

## 2015-11-16 ENCOUNTER — Encounter: Payer: Medicare Other | Attending: General Surgery | Admitting: General Surgery

## 2015-11-16 DIAGNOSIS — I10 Essential (primary) hypertension: Secondary | ICD-10-CM | POA: Diagnosis not present

## 2015-11-16 DIAGNOSIS — M069 Rheumatoid arthritis, unspecified: Secondary | ICD-10-CM | POA: Diagnosis not present

## 2015-11-16 DIAGNOSIS — L84 Corns and callosities: Secondary | ICD-10-CM | POA: Insufficient documentation

## 2015-11-16 DIAGNOSIS — I70203 Unspecified atherosclerosis of native arteries of extremities, bilateral legs: Secondary | ICD-10-CM | POA: Insufficient documentation

## 2015-11-16 DIAGNOSIS — E785 Hyperlipidemia, unspecified: Secondary | ICD-10-CM | POA: Diagnosis not present

## 2015-11-16 DIAGNOSIS — I4891 Unspecified atrial fibrillation: Secondary | ICD-10-CM | POA: Diagnosis not present

## 2015-11-16 DIAGNOSIS — I739 Peripheral vascular disease, unspecified: Secondary | ICD-10-CM

## 2015-11-16 NOTE — Progress Notes (Signed)
seeiheal 

## 2015-11-17 NOTE — Progress Notes (Signed)
LYNN, RECENDIZ ([id number]) Visit Report for 11/16/2015 Chief Complaint Document Details Patient Name: [name], [name] 11/16/2015 12:45 Date of Service: PM Medical Record [id number] Number: Patient Account Number: [id number] 1928-05-22 (81 y.o. Treating RN: Macarthur Critchley Date of Birth/Sex: Female) Other Clinician: Primary Care Physician: Ottie Glazier, Bani Gianfrancesco Referring Physician: Leanna Battles Physician/Extender: Suella Grove in Treatment: 0 Information Obtained from: Patient Electronic Signature(s) Signed: 11/16/2015 2:19:30 PM By: Judene Companion MD Previous Signature: 11/16/2015 2:08:23 PM Version By: Judene Companion MD Entered By: Judene Companion on 11/16/2015 14:19:30 Noberto Retort ([id number]) -------------------------------------------------------------------------------- HPI Details Patient Name: [name], [name] 11/16/2015 12:45 Date of Service: PM Medical Record [id number] Number: Patient Account Number: [id number] [birth year] (81 y.o. Treating RN: Macarthur Critchley Date of Birth/Sex: Female) Other Clinician: Primary Care Physician: Ottie Glazier, Helina Hullum Referring Physician: Leanna Battles Physician/Extender: Suella Grove in Treatment: 0 Electronic Signature(s) Signed: 11/16/2015 2:19:37 PM By: Judene Companion MD Previous Signature: 11/16/2015 2:08:32 PM Version By: Judene Companion MD Entered By: Judene Companion on 11/16/2015 14:19:37 Noberto Retort ([id number]) -------------------------------------------------------------------------------- Physical Exam Details Patient Name: [name], [name] 11/16/2015 12:45 Date of Service: PM Medical Record [id number] Number: Patient Account Number: [id number] 1928/01/31 (81 y.o. Treating RN: Macarthur Critchley Date of Birth/Sex: Female) Other Clinician: Primary Care Physician: Ottie Glazier, Anshu Wehner Referring Physician: Leanna Battles Physician/Extender: Suella Grove in Treatment:  0 Electronic Signature(s) Signed: 11/16/2015 2:19:46 PM By: Judene Companion MD Previous Signature: 11/16/2015 2:08:39 PM Version By: Judene Companion MD Entered By: Judene Companion on 11/16/2015 14:19:46 Noberto Retort ([id number]) -------------------------------------------------------------------------------- Problem List Details Patient Name: [name], [name] 11/16/2015 12:45 Date of Service: PM Medical Record [id number] Number: Patient Account Number: [id number] 06-23-1928 (81 y.o. 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 Treating RN: Macarthur Critchley Date of Birth/Sex: Female) Other Clinician: Primary Care Physician: Ottie Glazier, Lacretia Tindall Referring Physician: Leanna Battles Physician/Extender: Suella Grove in Treatment: 0 Subjective Chief Complaint Information obtained from Patient Wound History Patient reportedly has not tested positive for osteomyelitis. Patient reportedly has had testing performed to evaluate circulation in the legs. Patient experiences the  following problems associated with their wounds: pain. Patient History Information obtained from Patient, Chart. Allergies no known drug allergies Social History Never smoker. Medical History Cardiovascular Patient has history of Arrhythmia - atypical chest pain, Hypertension Gastrointestinal Denies history of Cirrhosis , Colitis, Crohn s, Hepatitis A, Hepatitis B, Hepatitis C Endocrine Denies history of Type I Diabetes, Type II Diabetes Genitourinary Denies history of End Stage Renal Disease Immunological Denies history of Lupus Erythematosus, Raynaud s, Scleroderma Integumentary (Skin) Denies history of History of Burn, History of pressure wounds Musculoskeletal Patient has history of Rheumatoid Arthritis, Osteoarthritis Medical And Surgical History Notes Cardiovascular Bebee, Kayli B. ([id number]) Afib with Coumadin therapy Endocrine hyperlipidemia Musculoskeletal T-spine compression fracture left shoulder pain Review of Systems (ROS) Constitutional Symptoms (General Health) The patient has no complaints or  symptoms. Eyes Complains or has symptoms of Glasses / Contacts - reading. Ear/Nose/Mouth/Throat The patient has no complaints or symptoms. Hematologic/Lymphatic The patient has no complaints or symptoms. Respiratory The patient has no complaints or symptoms. Cardiovascular The patient has no complaints or symptoms. Gastrointestinal The patient has no complaints or symptoms. Endocrine The patient has no complaints or symptoms. Genitourinary The patient has no complaints or symptoms. Immunological The patient has no complaints or symptoms. Integumentary (Skin) The patient has no complaints or symptoms, patient c/o open wounds, no open wounds noted to bilateral feet Musculoskeletal The patient has no complaints or symptoms. Neurologic The patient has no complaints or symptoms. Oncologic The patient has no complaints or symptoms. Psychiatric The patient  has no complaints or symptoms. Objective Constitutional Vitals Time Taken: 12:55 PM, Height: 63 in, Source: Stated, Weight: 115 lbs, Source: Stated, BMI: 20.4, Temperature: 97.7 F, Pulse: 84 bpm, Respiratory Rate: 16 breaths/min, Blood Pressure: 150/89 mmHg. 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 Dry skin of toes with some callous formation. Rx - Nutritional therapist) Signed: 11/16/2015 2:20:26 PM By: Judene Companion MD Previous Signature: 11/16/2015 2:20:13 PM Version By: Judene Companion MD Previous Signature: 11/16/2015 2:18:48 PM Version By: Judene Companion MD Previous Signature: 11/16/2015 2:10:18 PM Version By: Judene Companion MD Entered By: Judene Companion on 11/16/2015 14:20:26 [name], [name] ([id number]) -------------------------------------------------------------------------------- ROS/PFSH Details Patient Name: [name], [name] 11/16/2015 12:45 Date of Service: PM Medical Record [id number] Number: Patient Account Number: [id number] June 08, 1928 (81 y.o. Treating RN: Afful, RN, BSN, Velva Harman Date of Birth/Sex: Female) Other Clinician: Primary Care Physician: Ottie Glazier, Allante Whitmire Referring Physician: Leanna Battles Physician/Extender: Weeks in Treatment: 0 Information Obtained From Patient Chart Wound History Do you currently have one or more open woundso No Have you tested positive for osteomyelitis (bone infection)o No Have you had any tests for circulation on your legso Yes Where was the test doneo years ago Have you had other problems associated with your woundso Other: pain Eyes Complaints and Symptoms: Positive for: Glasses / Contacts - reading Constitutional Symptoms (General  Health) Complaints and Symptoms: No Complaints or Symptoms Ear/Nose/Mouth/Throat Complaints and Symptoms: No Complaints or Symptoms Hematologic/Lymphatic Complaints and Symptoms: No Complaints or Symptoms Respiratory Complaints and Symptoms: No Complaints or Symptoms Cardiovascular Complaints and Symptoms: No Complaints or Symptoms Medical History: Positive for: Arrhythmia - atypical chest pain; Hypertension Byrd, Akeila B. ([id number]) Past Medical History Notes: Afib with Coumadin therapy Gastrointestinal Complaints and Symptoms: No Complaints or Symptoms Medical History: Negative for: Cirrhosis ; Colitis; Crohnos; Hepatitis A; Hepatitis B; Hepatitis C Endocrine Complaints and Symptoms: No Complaints or Symptoms Medical History: Negative for: Type I Diabetes; Type II Diabetes Past Medical History Notes: hyperlipidemia Genitourinary Complaints and Symptoms: No Complaints or Symptoms Medical History: Negative for: End Stage Renal Disease Immunological Complaints and Symptoms: No Complaints or Symptoms Medical History: Negative for: Lupus Erythematosus; Raynaudos; Scleroderma Integumentary (Skin) Complaints and Symptoms: No Complaints or Symptoms Complaints and Symptoms: Review of System Notes: patient c/o open wounds, no open wounds noted to bilateral feet Medical History: Negative for: History of Burn; History of pressure wounds Musculoskeletal Greenbaum, Kinsey B. ([id number]) Complaints and Symptoms: No Complaints or Symptoms Medical History: Positive for: Rheumatoid Arthritis; Osteoarthritis Past Medical History Notes: T-spine compression fracture left shoulder pain Neurologic Complaints and Symptoms: No Complaints or Symptoms Oncologic Complaints  and Symptoms: No Complaints or Symptoms Psychiatric Complaints and Symptoms: No Complaints or Symptoms Immunizations Immunization Notes: current Family and Social History Never smoker; Financial Concerns:  No; Food, Clothing or Shelter Needs: No; Support System Lacking: No; Transportation Concerns: No Electronic Signature(s) Signed: 11/16/2015 3:28:31 PM By: Regan Lemming BSN, RN Signed: 11/17/2015 8:01:58 AM By: Judene Companion MD Entered By: Regan Lemming on 11/16/2015 13:30:09 Noberto Retort ([id number]) -------------------------------------------------------------------------------- SuperBill Details Patient Name: Noberto Retort Date of Service: 11/16/2015 Medical Record Number: [id number] Patient Account Number: [id number] Date of Birth/Sex: 02/12/1928 (81 y.o. 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

## 2015-11-17 NOTE — Progress Notes (Signed)
[name], [name] ([id number]) Visit Report for 11/16/2015 Abuse/Suicide Risk Screen Details Patient Name: [name], [name] 11/16/2015 12:45 Date of Service: PM Medical Record [id number] Number: Patient Account Number: [id number] Feb 13, 1928 (81 y.o. Treating RN: Afful, RN, BSN, Velva Harman Date of Birth/Sex: Female) Other Clinician: Primary Care Physician: Ottie Glazier, PETER Referring Physician: Leanna Battles Physician/Extender: Weeks in Treatment: 0 Abuse/Suicide Risk Screen Items Answer ABUSE/SUICIDE RISK SCREEN: Has anyone close to you tried to hurt or harm you recentlyo No Do you feel uncomfortable with anyone in your familyo No Has anyone forced you do things that you didnot want to doo No Do you have any thoughts of harming yourselfo No Patient displays signs or symptoms of abuse and/or neglect. No Electronic Signature(s) Signed: 11/16/2015 3:28:31 PM By: Regan Lemming BSN, RN Entered By: Regan Lemming on 11/16/2015 13:03:37 [name] ([id number]) -------------------------------------------------------------------------------- Activities of Daily Living Details Patient Name: [name], [name] 11/16/2015 12:45 Date of Service: PM Medical Record [id number] Number: Patient Account Number: [id number] [birth year] (81 y.o. Treating RN: Baruch Gouty, RN, BSN, Velva Harman Date of Birth/Sex: Female) Other Clinician: Primary Care Physician: Ottie Glazier, [place] Referring Physician: Leanna Battles Physician/Extender: Weeks in Treatment: 0 Activities of Daily Living Items Answer Activities of Daily Living (Please select one for each item) Drive Automobile Completely Able Take Medications Completely Able Use Telephone Completely Able Care for Appearance Completely Able Use Toilet Completely Able Bath / Shower Completely Able Dress Self Completely Able Feed Self Completely Able Walk Need Assistance Get In / Out Bed Completely Able Housework Completely  Able Prepare Meals Completely [place] for Self Completely Able Electronic Signature(s) Signed: 11/16/2015 3:28:31 PM By: Regan Lemming BSN, RN Entered By: Regan Lemming on 11/16/2015 13:04:08 [name] ([id number]) -------------------------------------------------------------------------------- Education Assessment Details Patient Name: [name], [name] 11/16/2015 12:45 Date of Service: PM Medical Record [id number] Number: Patient Account Number: [id number] 06/10/1928 (81 y.o. Treating RN: Afful, RN, BSN, Velva Harman Date of Birth/Sex: Female) Other Clinician: Primary Care Physician: Ottie Glazier, PETER Referring Physician: Leanna Battles Physician/Extender: [place] in Treatment: 0 Learning Preferences/Education Level/Primary Language Learning Preference: Explanation, Demonstration Highest Education Level: College or Above Preferred Language: English Cognitive Barrier Assessment/Beliefs Language Barrier: No Translator Needed: No Memory Deficit: No Emotional Barrier: No Cultural/Religious Beliefs Affecting Medical No Care: Physical Barrier Assessment Impaired Vision: Yes Glasses, reading Impaired Hearing: No Decreased Hand dexterity: No Knowledge/Comprehension Assessment Knowledge Level: High Comprehension Level: High Ability to understand written High instructions: Ability to understand verbal High instructions: Motivation Assessment Anxiety Level: Calm Cooperation: Cooperative Education Importance: Acknowledges Need Interest in Health Problems: Asks Questions Perception: Coherent Willingness to Engage in Self- High Management Activities: Readiness to Engage in Self- High Management Activities: [name], [name] ([id number]) Electronic Signature(s) Signed: 11/16/2015 3:28:31 PM By: Regan Lemming BSN, RN Entered By: Regan Lemming on 11/16/2015 13:04:58 [name]  ([id number]) -------------------------------------------------------------------------------- Fall Risk Assessment Details Patient Name: [name] 11/16/2015 12:45 Date of Service: PM Medical Record [id number] Number: Patient Account Number: [id number] 1928/11/08 (81 y.o. Treating RN: Baruch Gouty, RN, BSN, Velva Harman Date of Birth/Sex: Female) Other Clinician: Primary Care Physician: Ottie Glazier, PET[place] Referring Physician: Leanna Battles Physician/Extender: Weeks in Treatment: 0 Fall Risk Assessment Items Have you had 2 or more falls in the last 12 monthso 0 Yes Have you had any fall that resulted in injury in the last 12 monthso 0 No FALL RISK ASSESSMENT: History of falling - immediate or within 3 months 0 No Secondary diagnosis  15 Yes Ambulatory aid None/bed rest/wheelchair/nurse 0 No Crutches/cane/walker 15 Yes Furniture 0 No IV Access/Saline Lock 0 No Gait/Training Normal/bed rest/immobile 0 No Weak 10 Yes Impaired 0 No Mental Status Oriented to own ability 0 Yes Electronic Signature(s) Signed: 11/16/2015 3:28:31 PM By: Regan Lemming BSN, RN Entered By: Regan Lemming on 11/16/2015 13:05:53 [name] ([id number]) -------------------------------------------------------------------------------- Foot Assessment Details Patient Name: [name], [name] 11/16/2015 12:45 Date of Service: PM Medical Record [id number] Number: Patient Account Number: [id number] October 25, 1928 (81 y.o. Treating RN: Baruch Gouty, RN, BSN, Velva Harman Date of Birth/Sex: Female) Other Clinician: Primary Care Physician: Ottie Glazier, PETER Referring Physician: Leanna Battles Physician/Extender: Weeks in Treatment: 0 Foot Assessment Items Site Locations + = Sensation present, - = Sensation absent, C = Callus, U = Ulcer R = Redness, W = Warmth, M = Maceration, PU = Pre-ulcerative lesion F = Fissure, S = Swelling, D = Dryness Assessment Right: Left: Other Deformity: No  No Prior Foot Ulcer: No No Prior Amputation: Yes No Charcot Joint: No No Ambulatory Status: Ambulatory With Help Assistance Device: Cane Gait: Administrator, arts) Signed: 11/16/2015 3:28:31 PM By: Regan Lemming BSN, RN Entered By: Regan Lemming on 11/16/2015 13:08:55 [name] ([id number]) [name], [name] ([id number]) -------------------------------------------------------------------------------- Nutrition Risk Assessment Details Patient Name: [name], [name] 11/16/2015 12:45 Date of Service: PM Medical Record [id number] Number: Patient Account Number: [id number] November 04, 1928 (81 y.o. Treating RN: Afful, RN, BSN, Velva Harman Date of Birth/Sex: Female) Other Clinician: Primary Care Physician: Ottie Glazier, PETER Referring Physician: Leanna Battles Physician/Extender: Weeks in Treatment: 0 Height (in): 63 Weight (lbs): 115 Body Mass Index (BMI): 20.4 Nutrition Risk Assessment Items NUTRITION RISK SCREEN: I have an illness or condition that made me change the kind and/or 0 No amount of food I eat I eat fewer than two meals per day 0 No I eat few fruits and vegetables, or milk products 0 No I have three or more drinks of beer, liquor or wine almost every day 0 No I have tooth or mouth problems that make it hard for me to eat 0 No I don't always have enough money to buy the food I need 0 No I eat alone most of the time 0 No I take three or more different prescribed or over-the-counter drugs a 1 Yes day Without wanting to, I have lost or gained 10 pounds in the last six 0 No months I am not always physically able to shop, cook and/or feed myself 0 No Nutrition Protocols Good Risk Protocol 0 No interventions needed Moderate Risk Protocol Electronic Signature(s) Signed: 11/16/2015 3:28:31 PM By: Regan Lemming BSN, RN Entered By: Regan Lemming on 11/16/2015 13:06:10

## 2015-11-17 NOTE — Progress Notes (Signed)
KYLIE, SIMMONDS ([id number]) Visit Report for 11/16/2015 Allergy List Details Patient Name: [name], [name] Date of Service: 11/16/2015 12:45 PM Medical Record Number: [id number] Patient Account Number: [id number] Date of Birth/Sex: Aug 21, 1928 (82 y.o. Female) Treating RN: Baruch Gouty, RN, BSN, Velva Harman Primary Care Physician: Leanna Battles Other Clinician: Referring Physician: Leanna Battles Treating Physician/Extender: Judene Companion Weeks in Treatment: 0 Allergies Active Allergies no known drug allergies Allergy Notes Electronic Signature(s) Signed: 11/16/2015 3:28:31 PM By: Regan Lemming BSN, RN Entered By: Regan Lemming on 11/16/2015 13:17:23 [name] ([id number]) -------------------------------------------------------------------------------- Arrival Information Details Patient Name: [name] Date of Service: 11/16/2015 12:45 PM Medical Record Number: [id number] Patient Account Number: [id number] Date of Birth/Sex: [birth year] (81 y.o. Female) Treating RN: Macarthur Critchley Primary Care Physician: Leanna Battles Other Clinician: Referring Physician: Leanna Battles Treating Physician/Extender: Benjaman Pott in Treatment: 0 Visit Information Patient Arrived: Cane Arrival Time: 12:51 Accompanied By: self Transfer Assistance: None Patient Identification Verified: Yes Secondary Verification Process Completed: Yes Electronic Signature(s) Signed: 11/16/2015 3:28:31 PM By: Regan Lemming BSN, RN Entered By: Regan Lemming on 11/16/2015 12:54:30 [name] ([id number]) -------------------------------------------------------------------------------- Clinic Level of Care Assessment Details Patient Name: [name] Date of Service: 11/16/2015 12:45 PM Medical Record Number: [id number] Patient Account Number: [id number] Date of Birth/Sex: Sep 10, 1928 (81 y.o. Female) Treating RN: Macarthur Critchley Primary Care Physician: Leanna Battles Other Clinician: Referring  Physician: Leanna Battles Treating Physician/Extender: Benjaman Pott in Treatment: 0 Clinic Level of Care Assessment Items TOOL 2 Quantity Score X - Use when only an EandM is performed on the INITIAL visit 1 0 ASSESSMENTS - Nursing Assessment / Reassessment X - General Physical Exam (combine w/ comprehensive assessment (listed just 1 20 below) when performed on new pt. evals) X - Comprehensive Assessment (HX, ROS, Risk Assessments, Wounds Hx, etc.) 1 25 ASSESSMENTS - Wound and Skin Assessment / Reassessment []  - Simple Wound Assessment / Reassessment - one wound 0 []  - Complex Wound Assessment / Reassessment - multiple wounds 0 []  - Dermatologic / Skin Assessment (not related to wound area) 0 ASSESSMENTS - Ostomy and/or Continence Assessment and Care []  - Incontinence Assessment and Management 0 []  - Ostomy Care Assessment and Management (repouching, etc.) 0 PROCESS - Coordination of Care X - Simple Patient / Family Education for ongoing care 1 15 []  - Complex (extensive) Patient / Family Education for ongoing care 0 X - Staff obtains Programmer, systems, Records, Test Results / Process Orders 1 10 []  - Staff telephones HHA, Nursing Homes / Clarify orders / etc 0 []  - Routine Transfer to another Facility (non-emergent condition) 0 []  - Routine Hospital Admission (non-emergent condition) 0 []  - New Admissions / Biomedical engineer / Ordering NPWT, Apligraf, etc. 0 []  - Emergency Hospital Admission (emergent condition) 0 X - Simple Discharge Coordination 1 10 Arns, Dejah B. ([id number]) []  - Complex (extensive) Discharge Coordination 0 PROCESS - Special Needs []  - Pediatric / Minor Patient Management 0 []  - Isolation Patient Management 0 []  - Hearing / Language / Visual special needs 0 []  - Assessment of Community assistance (transportation, D/C planning, etc.) 0 []  - Additional assistance / Altered mentation 0 []  - Support Surface(s) Assessment (bed, cushion, seat, etc.)  0 INTERVENTIONS - Wound Cleansing / Measurement []  - Wound Imaging (photographs - any number of wounds) 0 []  - Wound Tracing (instead of photographs) 0 []  - Simple Wound Measurement - one wound 0 []  - Complex Wound Measurement - multiple wounds 0 []  - Simple Wound Cleansing - one wound  0 []  - Complex Wound Cleansing - multiple wounds 0 INTERVENTIONS - Wound Dressings []  - Small Wound Dressing one or multiple wounds 0 []  - Medium Wound Dressing one or multiple wounds 0 []  - Large Wound Dressing one or multiple wounds 0 []  - Application of Medications - injection 0 INTERVENTIONS - Miscellaneous []  - External ear exam 0 []  - Specimen Collection (cultures, biopsies, blood, body fluids, etc.) 0 []  - Specimen(s) / Culture(s) sent or taken to Lab for analysis 0 []  - Patient Transfer (multiple staff / Harrel Lemon Lift / Similar devices) 0 []  - Simple Staple / Suture removal (25 or less) 0 []  - Complex Staple / Suture removal (26 or more) 0 H[place]en[place], Rahcel B. ([id number]) []  - Hypo / Hyperglycemic Management (close monitor of Blood Glucose) 0 []  - Ankle / Brachial Index (ABI) - do not check if billed separately 0 Has the patient been seen at the hospital within the last three years: Yes Total Score: 80 Level Of Care: New/Established - Level 3 Electronic Signature(s) Signed: 11/16/2015 3:28:31 PM By: Regan Lemming BSN, RN Entered By: Regan Lemming on 11/16/2015 13:34:23 [name] ([id number]) -------------------------------------------------------------------------------- Encounter Discharge Information Details Patient Name: [name] Date of Service: 11/16/2015 12:45 PM Medical Record Number: [id number] Patient Account Number: [id number] Date of Birth/Sex: [birth year] (81 y.o. Female) Treating RN: Afful, RN, BSN, Velva Harman Primary Care Physician: Leanna Battles Other Clinician: Referring Physician: Leanna Battles Treating Physician/Extender: Benjaman Pott in Treatment: 0 Encounter  Discharge Information Items Discharge Pain Level: 7 Discharge Condition: Stable Ambulatory Status: Cane Discharge Destination: Home Private Transportation: Auto Accompanied By: self Schedule Follow-up Appointment: No Medication Reconciliation completed and Yes provided to Patient/Care Nasier Thumm: Clinical Summary of Care: Notes no follow up appointment needed Electronic Signature(s) Signed: 11/16/2015 2:26:30 PM By: Judene Companion MD Entered By: Judene Companion on 11/16/2015 14:26:30 [name] ([id number]) -------------------------------------------------------------------------------- Lower Extremity Assessment Details Patient Name: [name] Date of Service: 11/16/2015 12:45 PM Medical Record Number: [id number] Patient Account Number: [id number] Date of Birth/Sex: 1928/06/07 (81 y.o. Female) Treating RN: Afful, RN, BSN, Velva Harman Primary Care Physician: Leanna Battles Other Clinician: Referring Physician: Leanna Battles Treating Physician/Extender: Benjaman Pott in Treatment: 0 Edema Assessment Assessed: [Left: No] [Right: No] Edema: [Left: No] [Right: No] Vascular Assessment Pulses: Posterior Tibial Dorsalis Pedis Doppler: [Left:Multiphasic] [Right:Multiphasic] Extremity colors, hair growth, and conditions: Extremity Color: [Left:Mottled] [Right:Mottled] Hair Growth on Extremity: [Left:No] [Right:No] Temperature of Extremity: [Left:Cool] [Right:Cool] Capillary Refill: [Left:> 3 seconds] [Right:> 3 seconds] Dependent Rubor: [Left:No] [Right:No] Blanched when Elevated: [Left:No] [Right:No] Lipodermatosclerosis: [Left:No] [Right:No] Toe Nail Assessment Left: Right: Thick: Yes Yes Discolored: No No Deformed: No No Improper Length and Hygiene: No No Electronic Signature(s) Signed: 11/16/2015 3:28:31 PM By: Regan Lemming BSN, RN Entered By: Regan Lemming on 11/16/2015 13:03:06 [name]  ([id number]) -------------------------------------------------------------------------------- Multi Wound Chart Details Patient Name: [name] Date of Service: 11/16/2015 12:45 PM Medical Record Number: [id number] Patient Account Number: [id number] Date of Birth/Sex: 1927/12/01 (81 y.o. Female) Treating RN: Baruch Gouty, RN, BSN, Velva Harman Primary Care Physician: Leanna Battles Other Clinician: Referring Physician: Leanna Battles Treating Physician/Extender: Benjaman Pott in Treatment: 0 Vital Signs Height(in): 63 Pulse(bpm): 84 Weight(lbs): 115 Blood Pressure (mmHg): Body Mass Index(BMI): 20 Temperature(F): 97.7 Respiratory Rate 16 (breaths/min): Wound Assessments Treatment Notes Electronic Signature(s) Signed: 11/16/2015 3:28:31 PM By: Regan Lemming BSN, RN Entered By: Regan Lemming on 11/16/2015 13:09:48 [name] ([id number]) -------------------------------------------------------------------------------- Pain Assessment Details Patient Name: [name] Date of Service: 11/16/2015 12:45  PM Medical Record Number: [id number] Patient Account Number: [id number] Date of Birth/Sex: October 08, 1928 (81 y.o. Female) Treating RN: Afful, RN, BSN, Velva Harman Primary Care Physician: Leanna Battles Other Clinician: Referring Physician: Leanna Battles Treating Physician/Extender: Benjaman Pott in Treatment: 0 Active Problems Location of Pain Severity and Description of Pain Patient Has Paino Yes Site Locations Pain Location: Pain in Ulcers Rate the pain. Current Pain Level: 7 Pain Management and Medication Current Pain Management: Medication: Yes Electronic Signature(s) Signed: 11/16/2015 3:28:31 PM By: Regan Lemming BSN, RN Entered By: Regan Lemming on 11/16/2015 12:55:05 [name] ([id number]) -------------------------------------------------------------------------------- Patient/Caregiver Education Details Patient Name: [name] Date of Service:  11/16/2015 12:45 PM Medical Record Number: [id number] Patient Account Number: [id number] Date of Birth/Gender: 03-23-1928 (81 y.o. Female) Treating RN: Macarthur Critchley Primary Care Physician: Leanna Battles Other Clinician: Referring Physician: Leanna Battles Treating Physician/Extender: Benjaman Pott in Treatment: 0 Education Assessment Education Provided To: Patient Education Topics Provided Welcome To [facility] [place]: Handouts: [facility] Methods: Explain/Verbal Responses: State content correctly Wound/Skin Impairment: Handouts: Skin Care Do's and Dont's Methods: Explain/Verbal Responses: State content correctly Electronic Signature(s) Signed: 11/16/2015 2:26:37 PM By: Judene Companion MD Entered By: Judene Companion on 11/16/2015 14:26:36 [name] ([id number]) -------------------------------------------------------------------------------- Vitals Details Patient Name: [name] Date of Service: 11/16/2015 12:45 PM Medical Record Number: [id number] Patient Account Number: [id number] Date of Birth/Sex: January 15, 1928 (81 y.o. Female) Treating RN: Afful, RN, BSN, Velva Harman Primary Care Physician: Leanna Battles Other Clinician: Referring Physician: Leanna Battles Treating Physician/Extender: Benjaman Pott in Treatment: 0 Vital Signs Time Taken: 12:55 Temperature (F): 97.7 Height (in): 63 Pulse (bpm): 84 Source: Stated Respiratory Rate (breaths/min): 16 Weight (lbs): 115 Blood Pressure (mmHg): 150/89 Source: Stated Reference Range: 80 - 120 mg / dl Body Mass Index (BMI): 20.4 Electronic Signature(s) Signed: 11/16/2015 3:28:31 PM By: Regan Lemming BSN, RN Entered By: Regan Lemming on 11/16/2015 13:10:04

## 2015-12-01 ENCOUNTER — Encounter: Payer: Self-pay | Admitting: Podiatry

## 2015-12-01 ENCOUNTER — Ambulatory Visit (INDEPENDENT_AMBULATORY_CARE_PROVIDER_SITE_OTHER): Payer: Medicare Other | Admitting: Podiatry

## 2015-12-01 VITALS — BP 153/80 | HR 76 | Resp 12

## 2015-12-01 DIAGNOSIS — M2040 Other hammer toe(s) (acquired), unspecified foot: Secondary | ICD-10-CM

## 2015-12-01 DIAGNOSIS — L84 Corns and callosities: Secondary | ICD-10-CM | POA: Diagnosis not present

## 2015-12-01 DIAGNOSIS — I739 Peripheral vascular disease, unspecified: Secondary | ICD-10-CM

## 2015-12-01 MED ORDER — HYDROCODONE-ACETAMINOPHEN 5-325 MG PO TABS: 1.0000 | ORAL_TABLET | Freq: Four times a day (QID) | ORAL | Status: DC | PRN

## 2015-12-03 NOTE — Progress Notes (Signed)
Subjective:     Patient ID: [name], female   DOB: [birth year], 82 y.o.   MRN: [id number]  HPI patient presents with chronic lesions plantar aspect both feet that she cannot take care of and also digital deformities on both feet which she tries to pad without relief   Review of Systems     Objective:   Physical Exam Neurovascular status mildly diminished but unchanged with severe keratotic lesion sub-metatarsals and digital deformities noted on the fifth and third digits of both feet. Good digital perfusion noted well oriented 3    Assessment:     Digital deformity with also patient noted to have lesion secondary to bone pressure    Plan:     H&P conditions reviewed and debridement accomplished and padding. Discussed hammertoe correction which may be doing the future and discussed this also with her caregiver

## 2015-12-05 ENCOUNTER — Ambulatory Visit: Payer: Medicare Other | Admitting: Cardiovascular Disease

## 2015-12-07 ENCOUNTER — Encounter (HOSPITAL_BASED_OUTPATIENT_CLINIC_OR_DEPARTMENT_OTHER): Payer: Medicare Other

## 2015-12-13 ENCOUNTER — Ambulatory Visit (INDEPENDENT_AMBULATORY_CARE_PROVIDER_SITE_OTHER): Payer: Medicare Other | Admitting: Pharmacist Clinician (PhC)/ Clinical Pharmacy Specialist

## 2015-12-13 DIAGNOSIS — M5136 Other intervertebral disc degeneration, lumbar region: Secondary | ICD-10-CM | POA: Diagnosis not present

## 2015-12-13 DIAGNOSIS — Z7901 Long term (current) use of anticoagulants: Secondary | ICD-10-CM

## 2015-12-13 DIAGNOSIS — I482 Chronic atrial fibrillation, unspecified: Secondary | ICD-10-CM

## 2015-12-13 LAB — POCT INR: INR: 1.2

## 2016-01-12 DIAGNOSIS — M79671 Pain in right foot: Secondary | ICD-10-CM | POA: Diagnosis not present

## 2016-01-12 DIAGNOSIS — L84 Corns and callosities: Secondary | ICD-10-CM | POA: Diagnosis not present

## 2016-02-06 DIAGNOSIS — E1151 Type 2 diabetes mellitus with diabetic peripheral angiopathy without gangrene: Secondary | ICD-10-CM | POA: Diagnosis not present

## 2016-02-06 DIAGNOSIS — E11621 Type 2 diabetes mellitus with foot ulcer: Secondary | ICD-10-CM | POA: Diagnosis not present

## 2016-02-06 DIAGNOSIS — Z6822 Body mass index (BMI) 22.0-22.9, adult: Secondary | ICD-10-CM | POA: Diagnosis not present

## 2016-02-06 DIAGNOSIS — Z7901 Long term (current) use of anticoagulants: Secondary | ICD-10-CM | POA: Diagnosis not present

## 2016-02-06 DIAGNOSIS — I7389 Other specified peripheral vascular diseases: Secondary | ICD-10-CM | POA: Diagnosis not present

## 2016-02-09 ENCOUNTER — Encounter (HOSPITAL_COMMUNITY): Payer: Self-pay

## 2016-02-09 ENCOUNTER — Emergency Department (HOSPITAL_COMMUNITY)
Admission: EM | Admit: 2016-02-09 | Discharge: 2016-02-09 | Disposition: A | Discharge status: Home / Self Care | Payer: Medicare Other | Attending: Emergency Medicine | Admitting: Emergency Medicine

## 2016-02-09 DIAGNOSIS — M7989 Other specified soft tissue disorders: Secondary | ICD-10-CM | POA: Insufficient documentation

## 2016-02-09 DIAGNOSIS — I10 Essential (primary) hypertension: Secondary | ICD-10-CM | POA: Insufficient documentation

## 2016-02-09 DIAGNOSIS — L03116 Cellulitis of left lower limb: Secondary | ICD-10-CM | POA: Diagnosis not present

## 2016-02-09 DIAGNOSIS — D649 Anemia, unspecified: Secondary | ICD-10-CM | POA: Insufficient documentation

## 2016-02-09 DIAGNOSIS — Z7901 Long term (current) use of anticoagulants: Secondary | ICD-10-CM | POA: Diagnosis not present

## 2016-02-09 DIAGNOSIS — L089 Local infection of the skin and subcutaneous tissue, unspecified: Secondary | ICD-10-CM | POA: Diagnosis not present

## 2016-02-09 DIAGNOSIS — K219 Gastro-esophageal reflux disease without esophagitis: Secondary | ICD-10-CM | POA: Insufficient documentation

## 2016-02-09 DIAGNOSIS — Z79899 Other long term (current) drug therapy: Secondary | ICD-10-CM | POA: Insufficient documentation

## 2016-02-09 DIAGNOSIS — T798XXA Other early complications of trauma, initial encounter: Secondary | ICD-10-CM

## 2016-02-09 LAB — CBC WITH DIFFERENTIAL/PLATELET
BASOS ABS: 0 10*3/uL (ref 0.0–0.1)
Basophils Relative: 0 %
Eosinophils Absolute: 0.2 10*3/uL (ref 0.0–0.7)
Eosinophils Relative: 2 %
HCT: 45.6 % (ref 36.0–46.0)
Hemoglobin: 15.2 g/dL — ABNORMAL HIGH (ref 12.0–15.0)
LYMPHS ABS: 1.5 10*3/uL (ref 0.7–4.0)
LYMPHS PCT: 15 %
MCH: 30.8 pg (ref 26.0–34.0)
MCHC: 33.3 g/dL (ref 30.0–36.0)
MCV: 92.5 fL (ref 78.0–100.0)
Monocytes Absolute: 0.5 10*3/uL (ref 0.1–1.0)
Monocytes Relative: 6 %
NEUTROS ABS: 7.5 10*3/uL (ref 1.7–7.7)
Neutrophils Relative %: 77 %
PLATELETS: 208 10*3/uL (ref 150–400)
RBC: 4.93 MIL/uL (ref 3.87–5.11)
RDW: 14.1 % (ref 11.5–15.5)
WBC: 9.7 10*3/uL (ref 4.0–10.5)

## 2016-02-09 LAB — COMPREHENSIVE METABOLIC PANEL
ALBUMIN: 3.6 g/dL (ref 3.5–5.0)
ALT: 22 U/L (ref 14–54)
AST: 19 U/L (ref 15–41)
Alkaline Phosphatase: 59 U/L (ref 38–126)
Anion gap: 9 (ref 5–15)
BILIRUBIN TOTAL: 1.1 mg/dL (ref 0.3–1.2)
BUN: 22 mg/dL — ABNORMAL HIGH (ref 6–20)
CHLORIDE: 107 mmol/L (ref 101–111)
CO2: 24 mmol/L (ref 22–32)
Calcium: 9.8 mg/dL (ref 8.9–10.3)
Creatinine, Ser: 0.9 mg/dL (ref 0.44–1.00)
GFR calc Af Amer: >60 mL/min (ref >60)
GFR, EST NON AFRICAN AMERICAN: 57 mL/min — AB (ref >60)
Glucose, Bld: 180 mg/dL — ABNORMAL HIGH (ref 65–99)
POTASSIUM: 4.2 mmol/L (ref 3.5–5.1)
Sodium: 140 mmol/L (ref 135–145)
Total Protein: 6.8 g/dL (ref 6.5–8.1)

## 2016-02-09 LAB — I-STAT CG4 LACTIC ACID, ED
LACTIC ACID, VENOUS: 1.45 mmol/L (ref 0.5–2.0)
Lactic Acid, Venous: 1.34 mmol/L (ref 0.5–2.0)

## 2016-02-09 MED ORDER — DOXYCYCLINE HYCLATE 100 MG PO CAPS: 100.0000 mg | ORAL_CAPSULE | Freq: Two times a day (BID) | ORAL | Status: DC

## 2016-02-09 NOTE — ED Notes (Signed)
Pt placed in room at this time.

## 2016-02-09 NOTE — ED Notes (Signed)
Phlebotomy at the bedside  

## 2016-02-09 NOTE — Discharge Instructions (Signed)
Stop your current antibiotics and start the new antibiotics as instructed.  Strict elevation of leg and foot except to go to the bathroom or take your meals over the weekend.  Return to emergency room if you develop fever or pain in the foot or leg.  Edema Edema is an abnormal buildup of fluids in your bodytissues. Edema is somewhatdependent on gravity to pull the fluid to the lowest place in your body. That makes the condition more common in the legs and thighs (lower extremities). Painless swelling of the feet and ankles is common and becomes more likely as you get older. It is also common in looser tissues, like around your eyes.  When the affected area is squeezed, the fluid may move out of that spot and leave a dent for a few moments. This dent is called pitting.  CAUSES  There are many possible causes of edema. Eating too much salt and being on your feet or sitting for a long time can cause edema in your legs and ankles. Hot weather may make edema worse. Common medical causes of edema include:  Heart failure.  Liver disease.  Kidney disease.  Weak blood vessels in your legs.  Cancer.  An injury.  Pregnancy.  Some medications.  Obesity. SYMPTOMS  Edema is usually painless.Your skin may look swollen or shiny.  DIAGNOSIS  Your health care provider may be able to diagnose edema by asking about your medical history and doing a physical exam. You may need to have tests such as X-rays, an electrocardiogram, or blood tests to check for medical conditions that may cause edema.  TREATMENT  Edema treatment depends on the cause. If you have heart, liver, or kidney disease, you need the treatment appropriate for these conditions. General treatment may include:  Elevation of the affected body part above the level of your heart.  Compression of the affected body part. Pressure from elastic bandages or support stockings squeezes the tissues and forces fluid back into the blood vessels.  This keeps fluid from entering the tissues.  Restriction of fluid and salt intake.  Use of a water pill (diuretic). These medications are appropriate only for some types of edema. They pull fluid out of your body and make you urinate more often. This gets rid of fluid and reduces swelling, but diuretics can have side effects. Only use diuretics as directed by your health care provider. HOME CARE INSTRUCTIONS   Keep the affected body part above the level of your heart when you are lying down.   Do not sit still or stand for prolonged periods.   Do not put anything directly under your knees when lying down.  Do not wear constricting clothing or garters on your upper legs.   Exercise your legs to work the fluid back into your blood vessels. This may help the swelling go down.   Wear elastic bandages or support stockings to reduce ankle swelling as directed by your health care provider.   Eat a low-salt diet to reduce fluid if your health care provider recommends it.   Only take medicines as directed by your health care provider. SEEK MEDICAL CARE IF:   Your edema is not responding to treatment.  You have heart, liver, or kidney disease and notice symptoms of edema.  You have edema in your legs that does not improve after elevating them.   You have sudden and unexplained weight gain. SEEK IMMEDIATE MEDICAL CARE IF:   You develop shortness of breath or chest  pain.   You cannot breathe when you lie down.  You develop pain, redness, or warmth in the swollen areas.   You have heart, liver, or kidney disease and suddenly get edema.  You have a fever and your symptoms suddenly get worse. MAKE SURE YOU:   Understand these instructions.  Will watch your condition.  Will get help right away if you are not doing well or get worse.   This information is not intended to replace advice given to you by your health care provider. Make sure you discuss any questions you have  with your health care provider.   Document Released: 11/04/2005 Document Revised: 11/25/2014 Document Reviewed: 08/27/2013 Elsevier Interactive Patient Education 2016 [place].  Wound Infection A wound infection happens when a type of germ (bacteria) grows in a wound. Caring for the infection can help the wound heal. Wound infections need treatment. HOME CARE   Only take medicine as told by your doctor.  Take your antibiotic medicine as told. Finish it even if you start to feel better.  Clean the wound with mild soap and water as told. Rinse the soap off. Pat the area dry with a clean towel. Do not rub the wound.  Change any bandages (dressings) as told by your doctor.  Put cream and a bandage on the wound as told by your doctor.  If the bandage sticks, wet it with soapy water to remove the bandage.  Change the bandage if it gets wet, dirty, or starts to smell.  Take showers. Do not take baths, swim, or do anything that puts your wound under water.  Avoid exercise that makes you sweat.  If your wound itches, use a medicine that helps stop itching. Do not pick or scratch at the wound.  Keep all doctor visits as told. GET HELP RIGHT AWAY IF:   You have more puffiness (swelling), pain, or redness around the wound.  You have more yellowish-white fluid (pus) coming from the wound.  You have a bad smell coming from the wound.  Your wound breaks open more.  You have a fever. MAKE SURE YOU:   Understand these instructions.  Will watch your condition.  Will get help right away if you are not doing well or get worse.   This information is not intended to replace advice given to you by your health care provider. Make sure you discuss any questions you have with your health care provider.   Document Released: 08/13/2008 Document Revised: 01/27/2012 Document Reviewed: 04/24/2015 Elsevier Interactive Patient Education [employer].

## 2016-02-09 NOTE — ED Notes (Signed)
Pt.  Developed lt. 2nd toe redness and swelling on Sunday.  The redness and swelling is going up her shin.  She was at her MD on Tuesday and they started her on antibiotics and they her to a foot doctor which advised her to come to [place].  Pt. Has +sensation and is able to move her toe.  The dorsal are of her foot is very red, cool to touch and swollen.  Unable to palpate dorsal pulse.

## 2016-02-09 NOTE — ED Provider Notes (Signed)
CSN: [id number]     Arrival date & time 02/09/16  1124 History   First MD Initiated Contact with Patient 02/09/16 1334     Chief Complaint  Patient presents with  . Wound Infection      HPI Pt. Developed lt. 2nd toe redness and swelling on Sunday. The redness and swelling is going up her shin. She was at her MD on Tuesday and they started her on antibiotics and they her to a foot doctor which advised her to come to [place]. Pt. Has +sensation and is able to move her toe. The dorsal are of her foot is very red, cool to touch and swollen. Unable to palpate dorsal pulse.  Past Medical History  Diagnosis Date  . Arthritis   . GERD (gastroesophageal reflux disease)   . Hypertension   . Dysrhythmia     hx brief AF post op hip surg  . Anemia    Past Surgical History  Procedure Laterality Date  . Total knee arthroplasty  2010,2012    left  . Total hip arthroplasty  2006    left-fx  . Total knee arthroplasty  2009    right  . Appendectomy    . Tonsillectomy    . Eye surgery      both cataracts  . Colonoscopy    . Incision and drainage  2011    left hip inf-hemovac  . Neck mass excision    . Knee arthroscopy  1995    left  . Amputation Right 02/04/2013    Procedure: [name] ;  Surgeon: Wylene Simmer, MD;  Location: [facility];  Service: Orthopedics;  Laterality: Right;   Family History  Problem Relation Age of Onset  . Cancer Mother    Social History  Substance Use Topics  . Smoking status: Never Smoker   . Smokeless tobacco: None  . Alcohol Use: No   OB History    No data available     Review of Systems  Constitutional: Negative for fever and chills.  All other systems reviewed and are negative.     Allergies  Ceftriaxone sodium; Sulfonamide derivatives; and Vancomycin hcl  Home Medications   Prior to Admission medications   Medication Sig Start Date End Date Taking? Authorizing Provider  acetaminophen (TYLENOL) 325 MG tablet  Take 650 mg by mouth 2 (two) times daily as needed for mild pain.   Yes Historical Provider, MD  calcium carbonate (CALCIUM 500) 1250 MG tablet Take 1 tablet by mouth daily with breakfast.    Yes Historical Provider, MD  cholecalciferol (VITAMIN D) 1000 units tablet Take 1,000 Units by mouth daily.   Yes Historical Provider, MD  gabapentin (NEURONTIN) 100 MG capsule Take 100 mg by mouth at bedtime.    Yes Historical Provider, MD  vitamin B-12 (CYANOCOBALAMIN) 1000 MCG tablet Take 1,000 mcg by mouth daily.   Yes Historical Provider, MD  warfarin (COUMADIN) 1 MG tablet Take 0.5-1 mg by mouth daily at 6 PM. 1 mg on Friday, 0.5 mg all other days   Yes Historical Provider, MD  doxycycline (VIBRAMYCIN) 100 MG capsule Take 1 capsule (100 mg total) by mouth 2 (two) times daily. 02/09/16   Leonard Schwartz, MD   BP 153/101 mmHg  Pulse 82  Temp(Src) 98.1 F (36.7 C) (Oral)  Resp 18  Ht 5\' 2"  (1.575 m)  Wt 117 lb (53.071 kg)  BMI 21.39 kg/m2  SpO2 99% Physical Exam  Constitutional: She is oriented to  person, place, and time. She appears well-developed and well-nourished. No distress.  HENT:  Head: Normocephalic and atraumatic.  Eyes: Pupils are equal, round, and reactive to light.  Neck: Normal range of motion.  Cardiovascular: Normal rate and intact distal pulses.   Pulmonary/Chest: No respiratory distress.  Abdominal: Normal appearance. She exhibits no distension.  Musculoskeletal:       Legs:      Feet:  Neurological: She is alert and oriented to person, place, and time. No cranial nerve deficit.  Skin: Skin is warm and dry. No rash noted.  Psychiatric: She has a normal mood and affect. Her behavior is normal.  Nursing note and vitals reviewed.   ED Course  Procedures (including critical care time) Labs Review Labs Reviewed  COMPREHENSIVE METABOLIC PANEL - Abnormal; Notable for the following:    Glucose, Bld 180 (*)    BUN 22 (*)    GFR calc non Af Amer 57 (*)    All other components  within normal limits  CBC WITH DIFFERENTIAL/PLATELET - Abnormal; Notable for the following:    Hemoglobin 15.2 (*)    All other components within normal limits  CULTURE, BLOOD (ROUTINE X 2)  CULTURE, BLOOD (ROUTINE X 2)  I-STAT CG4 LACTIC ACID, ED  I-STAT CG4 LACTIC ACID, ED    Imaging Review No results found. I have personally reviewed and evaluated these images and lab results as part of my medical decision-making.  Discussed the case with Dr. Doran Durand from orthopedics.  He agreed patient could probably go home with strict elevation of leg over the weekend and follow-up with him next week.  She was instructed to return to emergency room should she develop fever or pain.  I did stop the Keflex and switch her to doxycycline twice a day.  Patient stable for discharge and agreeable with plan.  MDM   Final diagnoses:  Wound infection, initial encounter  Foot swelling        Leonard Schwartz, MD 02/09/16 [phone]

## 2016-02-09 NOTE — ED Notes (Signed)
MD Beaton at the bedside.  

## 2016-02-12 DIAGNOSIS — L03116 Cellulitis of left lower limb: Secondary | ICD-10-CM | POA: Diagnosis not present

## 2016-02-13 DIAGNOSIS — I4891 Unspecified atrial fibrillation: Secondary | ICD-10-CM | POA: Diagnosis not present

## 2016-02-13 DIAGNOSIS — Z7901 Long term (current) use of anticoagulants: Secondary | ICD-10-CM | POA: Diagnosis not present

## 2016-02-13 DIAGNOSIS — E11621 Type 2 diabetes mellitus with foot ulcer: Secondary | ICD-10-CM | POA: Diagnosis not present

## 2016-02-14 LAB — CULTURE, BLOOD (ROUTINE X 2): CULTURE: NO GROWTH

## 2016-02-15 LAB — CULTURE, BLOOD (ROUTINE X 2): Culture: NO GROWTH

## 2016-02-19 DIAGNOSIS — L03116 Cellulitis of left lower limb: Secondary | ICD-10-CM | POA: Diagnosis not present

## 2016-02-19 DIAGNOSIS — L97522 Non-pressure chronic ulcer of other part of left foot with fat layer exposed: Secondary | ICD-10-CM | POA: Diagnosis not present

## 2016-03-04 DIAGNOSIS — L97522 Non-pressure chronic ulcer of other part of left foot with fat layer exposed: Secondary | ICD-10-CM | POA: Diagnosis not present

## 2016-03-04 DIAGNOSIS — L03116 Cellulitis of left lower limb: Secondary | ICD-10-CM | POA: Diagnosis not present

## 2016-03-18 DIAGNOSIS — L97522 Non-pressure chronic ulcer of other part of left foot with fat layer exposed: Secondary | ICD-10-CM | POA: Diagnosis not present

## 2016-03-21 DIAGNOSIS — Z7901 Long term (current) use of anticoagulants: Secondary | ICD-10-CM | POA: Diagnosis not present

## 2016-03-21 DIAGNOSIS — I4891 Unspecified atrial fibrillation: Secondary | ICD-10-CM | POA: Diagnosis not present

## 2016-03-21 DIAGNOSIS — E11621 Type 2 diabetes mellitus with foot ulcer: Secondary | ICD-10-CM | POA: Diagnosis not present

## 2016-04-17 DIAGNOSIS — L97512 Non-pressure chronic ulcer of other part of right foot with fat layer exposed: Secondary | ICD-10-CM | POA: Diagnosis not present

## 2016-04-17 DIAGNOSIS — L97522 Non-pressure chronic ulcer of other part of left foot with fat layer exposed: Secondary | ICD-10-CM | POA: Diagnosis not present

## 2016-04-30 DIAGNOSIS — Z7901 Long term (current) use of anticoagulants: Secondary | ICD-10-CM | POA: Diagnosis not present

## 2016-04-30 DIAGNOSIS — I4891 Unspecified atrial fibrillation: Secondary | ICD-10-CM | POA: Diagnosis not present

## 2016-05-14 DIAGNOSIS — Z7901 Long term (current) use of anticoagulants: Secondary | ICD-10-CM | POA: Diagnosis not present

## 2016-05-14 DIAGNOSIS — I4891 Unspecified atrial fibrillation: Secondary | ICD-10-CM | POA: Diagnosis not present

## 2016-06-11 DIAGNOSIS — I4891 Unspecified atrial fibrillation: Secondary | ICD-10-CM | POA: Diagnosis not present

## 2016-06-11 DIAGNOSIS — Z7901 Long term (current) use of anticoagulants: Secondary | ICD-10-CM | POA: Diagnosis not present

## 2016-06-21 DIAGNOSIS — L97512 Non-pressure chronic ulcer of other part of right foot with fat layer exposed: Secondary | ICD-10-CM | POA: Diagnosis not present

## 2016-06-28 DIAGNOSIS — L97512 Non-pressure chronic ulcer of other part of right foot with fat layer exposed: Secondary | ICD-10-CM | POA: Diagnosis not present

## 2016-07-09 DIAGNOSIS — Z7901 Long term (current) use of anticoagulants: Secondary | ICD-10-CM | POA: Diagnosis not present

## 2016-07-09 DIAGNOSIS — I4891 Unspecified atrial fibrillation: Secondary | ICD-10-CM | POA: Diagnosis not present

## 2016-07-09 DIAGNOSIS — Z23 Encounter for immunization: Secondary | ICD-10-CM | POA: Diagnosis not present

## 2016-07-11 ENCOUNTER — Telehealth: Payer: Self-pay | Admitting: Cardiovascular Disease

## 2016-07-11 NOTE — Telephone Encounter (Signed)
Received records from [place] for appointment on 08/07/16 with Dr Gwenlyn Found.  Records given to [facility] (medical records) for Dr Kennon Holter schedule on 08/07/16. lp

## 2016-07-17 DIAGNOSIS — M2042 Other hammer toe(s) (acquired), left foot: Secondary | ICD-10-CM | POA: Diagnosis not present

## 2016-07-17 DIAGNOSIS — L97529 Non-pressure chronic ulcer of other part of left foot with unspecified severity: Secondary | ICD-10-CM | POA: Diagnosis not present

## 2016-07-17 DIAGNOSIS — M2011 Hallux valgus (acquired), right foot: Secondary | ICD-10-CM | POA: Diagnosis not present

## 2016-07-18 DIAGNOSIS — L97529 Non-pressure chronic ulcer of other part of left foot with unspecified severity: Secondary | ICD-10-CM | POA: Diagnosis not present

## 2016-07-18 DIAGNOSIS — L97519 Non-pressure chronic ulcer of other part of right foot with unspecified severity: Secondary | ICD-10-CM | POA: Diagnosis not present

## 2016-07-23 DIAGNOSIS — L97529 Non-pressure chronic ulcer of other part of left foot with unspecified severity: Secondary | ICD-10-CM | POA: Diagnosis not present

## 2016-07-23 DIAGNOSIS — L97519 Non-pressure chronic ulcer of other part of right foot with unspecified severity: Secondary | ICD-10-CM | POA: Diagnosis not present

## 2016-07-25 DIAGNOSIS — L97519 Non-pressure chronic ulcer of other part of right foot with unspecified severity: Secondary | ICD-10-CM | POA: Diagnosis not present

## 2016-07-25 DIAGNOSIS — L97529 Non-pressure chronic ulcer of other part of left foot with unspecified severity: Secondary | ICD-10-CM | POA: Diagnosis not present

## 2016-07-26 DIAGNOSIS — L97529 Non-pressure chronic ulcer of other part of left foot with unspecified severity: Secondary | ICD-10-CM | POA: Diagnosis not present

## 2016-07-31 DIAGNOSIS — L97519 Non-pressure chronic ulcer of other part of right foot with unspecified severity: Secondary | ICD-10-CM | POA: Diagnosis not present

## 2016-07-31 DIAGNOSIS — L97529 Non-pressure chronic ulcer of other part of left foot with unspecified severity: Secondary | ICD-10-CM | POA: Diagnosis not present

## 2016-08-01 DIAGNOSIS — Z7901 Long term (current) use of anticoagulants: Secondary | ICD-10-CM | POA: Diagnosis not present

## 2016-08-01 DIAGNOSIS — I4891 Unspecified atrial fibrillation: Secondary | ICD-10-CM | POA: Diagnosis not present

## 2016-08-07 ENCOUNTER — Encounter: Payer: Self-pay | Admitting: Cardiovascular Disease

## 2016-08-07 ENCOUNTER — Ambulatory Visit (INDEPENDENT_AMBULATORY_CARE_PROVIDER_SITE_OTHER): Payer: Medicare Other | Admitting: Cardiovascular Disease

## 2016-08-07 VITALS — BP 160/90 | HR 81 | Ht 62.0 in | Wt 110.4 lb

## 2016-08-07 DIAGNOSIS — I10 Essential (primary) hypertension: Secondary | ICD-10-CM

## 2016-08-07 DIAGNOSIS — I739 Peripheral vascular disease, unspecified: Secondary | ICD-10-CM | POA: Diagnosis not present

## 2016-08-07 NOTE — Progress Notes (Signed)
08/07/2016 [name]   07-11-1928  [id number]  Primary Physician Tiffany Lopes, MD Primary Cardiologist: Tiffany Harp MD [name], [place]  HPI:  [name] is an 82 year old thin appearing married Caucasian female mother of 64, grandmother of one grandchild referred to me by Dr. Doran [name], orthopedic surgeon at [facility] orth[facility], for vascular evaluation because of a poorly healing ulcer on the medial aspect of her right second toe in the "intertriginous zone". She also has a small ulcer on the tip of her left third toe. She has no cardiovascular risk factors. She is on Coumadin for question A. fib. She was never had a heart attack or stroke. She has had a poorly healing ulcer on her right second toe off and on for the last 2 years. She had Dopplers performed in our office 09/05/15 that showed normal ABIs bilaterally with one-vessel runoff on the right. Dorsalis pedis and three-vessel runoff on the left. She denies claudication. Renal function is normal.   Current Outpatient Prescriptions  Medication Sig Dispense Refill  . acetaminophen (TYLENOL) 325 MG tablet Take 650 mg by mouth 2 (two) times daily as needed for mild pain.    . calcium carbonate (CALCIUM 500) 1250 MG tablet Take 1 tablet by mouth daily with breakfast.     . cholecalciferol (VITAMIN D) 1000 units tablet Take 1,000 Units by mouth daily.    . vitamin B-12 (CYANOCOBALAMIN) 1000 MCG tablet Take 1,000 mcg by mouth daily.    Marland Kitchen warfarin (COUMADIN) 1 MG tablet Take 0.5-1 mg by mouth daily at 6 PM. 1 mg on Friday, 0.5 mg all other days     No current facility-administered medications for this visit.     Allergies  Allergen Reactions  . Ceftriaxone Sodium     REACTION: tinly feeling after given, then one hour later while vanco infusing pruritic rash  . Sulfonamide Derivatives Other (See Comments)    unknown  . Vancomycin Hcl     REACTION: rash happened 1hr after rocephin but immediately after infusing  vancomycin    Social History   Social History  . Marital status: Married    Spouse name: N/A  . Number of children: N/A  . Years of education: N/A   Occupational History  . Not on file.   Social History Main Topics  . Smoking status: Never Smoker  . Smokeless tobacco: Not on file  . Alcohol use No  . Drug use: No  . Sexual activity: Not on file   Other Topics Concern  . Not on file   Social History Narrative  . No narrative on file     Review of Systems: General: negative for chills, fever, night sweats or weight changes.  Cardiovascular: negative for chest pain, dyspnea on exertion, edema, orthopnea, palpitations, paroxysmal nocturnal dyspnea or shortness of breath Dermatological: negative for rash Respiratory: negative for cough or wheezing Urologic: negative for hematuria Abdominal: negative for nausea, vomiting, diarrhea, bright red blood per rectum, melena, or hematemesis Neurologic: negative for visual changes, syncope, or dizziness All other systems reviewed and are otherwise negative except as noted above.    Blood pressure (!) 160/90, pulse 81, height 5\' 2"  (1.575 m), weight 110 lb 6.4 oz (50.1 kg), SpO2 99 %.  General appearance: alert and no distress Neck: no adenopathy, no carotid bruit, no JVD, supple, symmetrical, trachea midline and thyroid not enlarged, symmetric, no tenderness/mass/nodules Lungs: clear to auscultation bilaterally Heart: regular rate and rhythm, S1, S2 normal, no  murmur, click, rub or gallop Extremities: extremities normal, atraumatic, no cyanosis or edema  EKG atrial fibrillation with a ventricular response of 81 and incomplete right bundle-branch block with septal Q waves. I personally reviewed this EKG  ASSESSMENT AND PLAN:   PAD (peripheral artery disease) ([place]) [name] was referred to me by Dr. Doran [name], orthopedic surgeon, for evaluation of nonhealing ulcers on her right second toe and left third toe. She is also scheduled  to see a podiatrist, Dr. Aurelio [name]  for further foot care. She has had the right second toe ulcer which is in the "intertriginous zone" between her first and second toe for 2 years off and on. She denies claudication. She is not diabetic nor does he smoke. Her Dopplers performed to office 09/05/15 showed normal ABIs though I suspect these were artificially elevated because of calcified vessels. Reportedly, she had one-vessel runoff on the right. Dorsalis pedis and 3 vessels on the left with moderate disease in her left popliteal artery. She denies claudication. Her feet are warm. I can palpate pulses in the right and left foot. She has normal renal function. She is on Coumadin for question A. fib. I do not think that she has a problem with vascular insufficiency concluding to her ulcer think that revascularizing her tibial vessels on the right would promote healing. I think aggressive local care by her podiatrist would be more beneficial. I will see her back as needed.      Tiffany Harp MD FACP,FACC,FAHA, [facility] 08/07/2016 9:31 AM

## 2016-08-07 NOTE — Assessment & Plan Note (Signed)
Miss Earp was referred to me by Dr. Doran Durand, orthopedic surgeon, for evaluation of nonhealing ulcers on her right second toe and left third toe. She is also scheduled to see a podiatrist, Dr. Aurelio Jew  for further foot care. She has had the right second toe ulcer which is in the "intertriginous zone" between her first and second toe for 2 years off and on. She denies claudication. She is not diabetic nor does he smoke. Her Dopplers performed to office 09/05/15 showed normal ABIs though I suspect these were artificially elevated because of calcified vessels. Reportedly, she had one-vessel runoff on the right. Dorsalis pedis and 3 vessels on the left with moderate disease in her left popliteal artery. She denies claudication. Her feet are warm. I can palpate pulses in the right and left foot. She has normal renal function. She is on Coumadin for question A. fib. I do not think that she has a problem with vascular insufficiency concluding to her ulcer think that revascularizing her tibial vessels on the right would promote healing. I think aggressive local care by her podiatrist would be more beneficial. I will see her back as needed.

## 2016-08-07 NOTE — Patient Instructions (Signed)
Medication Instructions:   NO CHANGES.  Follow-Up: Your physician recommends that you schedule a follow-up appointment ON AN AS NEEDED BASIS.   If you need a refill on your cardiac medications before your next appointment, please call your pharmacy.   

## 2016-08-08 DIAGNOSIS — L97529 Non-pressure chronic ulcer of other part of left foot with unspecified severity: Secondary | ICD-10-CM | POA: Diagnosis not present

## 2016-08-08 DIAGNOSIS — L97519 Non-pressure chronic ulcer of other part of right foot with unspecified severity: Secondary | ICD-10-CM | POA: Diagnosis not present

## 2016-08-09 DIAGNOSIS — L97519 Non-pressure chronic ulcer of other part of right foot with unspecified severity: Secondary | ICD-10-CM | POA: Diagnosis not present

## 2016-08-09 DIAGNOSIS — L97529 Non-pressure chronic ulcer of other part of left foot with unspecified severity: Secondary | ICD-10-CM | POA: Diagnosis not present

## 2016-08-14 DIAGNOSIS — L97529 Non-pressure chronic ulcer of other part of left foot with unspecified severity: Secondary | ICD-10-CM | POA: Diagnosis not present

## 2016-08-14 DIAGNOSIS — L97519 Non-pressure chronic ulcer of other part of right foot with unspecified severity: Secondary | ICD-10-CM | POA: Diagnosis not present

## 2016-08-21 DIAGNOSIS — L97519 Non-pressure chronic ulcer of other part of right foot with unspecified severity: Secondary | ICD-10-CM | POA: Diagnosis not present

## 2016-08-21 DIAGNOSIS — L97529 Non-pressure chronic ulcer of other part of left foot with unspecified severity: Secondary | ICD-10-CM | POA: Diagnosis not present

## 2016-08-22 DIAGNOSIS — L97519 Non-pressure chronic ulcer of other part of right foot with unspecified severity: Secondary | ICD-10-CM | POA: Diagnosis not present

## 2016-08-22 DIAGNOSIS — L97529 Non-pressure chronic ulcer of other part of left foot with unspecified severity: Secondary | ICD-10-CM | POA: Diagnosis not present

## 2016-08-29 DIAGNOSIS — I4891 Unspecified atrial fibrillation: Secondary | ICD-10-CM | POA: Diagnosis not present

## 2016-08-29 DIAGNOSIS — Z7901 Long term (current) use of anticoagulants: Secondary | ICD-10-CM | POA: Diagnosis not present

## 2016-09-04 DIAGNOSIS — L84 Corns and callosities: Secondary | ICD-10-CM | POA: Diagnosis not present

## 2016-09-19 DIAGNOSIS — I4891 Unspecified atrial fibrillation: Secondary | ICD-10-CM | POA: Diagnosis not present

## 2016-09-19 DIAGNOSIS — Z7901 Long term (current) use of anticoagulants: Secondary | ICD-10-CM | POA: Diagnosis not present

## 2016-10-01 DIAGNOSIS — Z1231 Encounter for screening mammogram for malignant neoplasm of breast: Secondary | ICD-10-CM | POA: Diagnosis not present

## 2016-10-01 DIAGNOSIS — Z803 Family history of malignant neoplasm of breast: Secondary | ICD-10-CM | POA: Diagnosis not present

## 2016-10-07 DIAGNOSIS — E784 Other hyperlipidemia: Secondary | ICD-10-CM | POA: Diagnosis not present

## 2016-10-07 DIAGNOSIS — E1151 Type 2 diabetes mellitus with diabetic peripheral angiopathy without gangrene: Secondary | ICD-10-CM | POA: Diagnosis not present

## 2016-10-07 DIAGNOSIS — M810 Age-related osteoporosis without current pathological fracture: Secondary | ICD-10-CM | POA: Diagnosis not present

## 2016-10-07 DIAGNOSIS — R358 Other polyuria: Secondary | ICD-10-CM | POA: Diagnosis not present

## 2016-10-07 DIAGNOSIS — R8290 Unspecified abnormal findings in urine: Secondary | ICD-10-CM | POA: Diagnosis not present

## 2016-10-15 DIAGNOSIS — L97521 Non-pressure chronic ulcer of other part of left foot limited to breakdown of skin: Secondary | ICD-10-CM | POA: Diagnosis not present

## 2016-10-15 DIAGNOSIS — L84 Corns and callosities: Secondary | ICD-10-CM | POA: Diagnosis not present

## 2016-10-21 DIAGNOSIS — I4891 Unspecified atrial fibrillation: Secondary | ICD-10-CM | POA: Diagnosis not present

## 2016-10-21 DIAGNOSIS — Z0000 Encounter for general adult medical examination without abnormal findings: Secondary | ICD-10-CM | POA: Diagnosis not present

## 2016-10-21 DIAGNOSIS — Z1389 Encounter for screening for other disorder: Secondary | ICD-10-CM | POA: Diagnosis not present

## 2016-10-21 DIAGNOSIS — Z7901 Long term (current) use of anticoagulants: Secondary | ICD-10-CM | POA: Diagnosis not present

## 2016-10-21 DIAGNOSIS — E11621 Type 2 diabetes mellitus with foot ulcer: Secondary | ICD-10-CM | POA: Diagnosis not present

## 2016-10-21 DIAGNOSIS — Z6821 Body mass index (BMI) 21.0-21.9, adult: Secondary | ICD-10-CM | POA: Diagnosis not present

## 2016-10-21 DIAGNOSIS — E784 Other hyperlipidemia: Secondary | ICD-10-CM | POA: Diagnosis not present

## 2016-10-21 DIAGNOSIS — E1151 Type 2 diabetes mellitus with diabetic peripheral angiopathy without gangrene: Secondary | ICD-10-CM | POA: Diagnosis not present

## 2016-10-21 DIAGNOSIS — R2689 Other abnormalities of gait and mobility: Secondary | ICD-10-CM | POA: Diagnosis not present

## 2016-10-21 DIAGNOSIS — I10 Essential (primary) hypertension: Secondary | ICD-10-CM | POA: Diagnosis not present

## 2016-10-21 DIAGNOSIS — M810 Age-related osteoporosis without current pathological fracture: Secondary | ICD-10-CM | POA: Diagnosis not present

## 2016-10-24 DIAGNOSIS — L97521 Non-pressure chronic ulcer of other part of left foot limited to breakdown of skin: Secondary | ICD-10-CM | POA: Diagnosis not present

## 2016-10-24 DIAGNOSIS — L84 Corns and callosities: Secondary | ICD-10-CM | POA: Diagnosis not present

## 2016-10-31 DIAGNOSIS — L84 Corns and callosities: Secondary | ICD-10-CM | POA: Diagnosis not present

## 2016-10-31 DIAGNOSIS — L97521 Non-pressure chronic ulcer of other part of left foot limited to breakdown of skin: Secondary | ICD-10-CM | POA: Diagnosis not present

## 2016-11-14 ENCOUNTER — Telehealth: Payer: Self-pay | Admitting: Cardiovascular Disease

## 2016-11-14 DIAGNOSIS — L84 Corns and callosities: Secondary | ICD-10-CM | POA: Diagnosis not present

## 2016-11-14 DIAGNOSIS — M79672 Pain in left foot: Secondary | ICD-10-CM | POA: Diagnosis not present

## 2016-11-14 DIAGNOSIS — I70293 Other atherosclerosis of native arteries of extremities, bilateral legs: Secondary | ICD-10-CM | POA: Diagnosis not present

## 2016-11-14 DIAGNOSIS — L97521 Non-pressure chronic ulcer of other part of left foot limited to breakdown of skin: Secondary | ICD-10-CM | POA: Diagnosis not present

## 2016-11-14 NOTE — Telephone Encounter (Signed)
New Message  [facility] called and voiced they're calling to get vascular information regarding this patient and information can be faxed.  Please f/u

## 2016-11-25 DIAGNOSIS — I4891 Unspecified atrial fibrillation: Secondary | ICD-10-CM | POA: Diagnosis not present

## 2016-11-25 DIAGNOSIS — Z7901 Long term (current) use of anticoagulants: Secondary | ICD-10-CM | POA: Diagnosis not present

## 2016-11-27 DIAGNOSIS — I70293 Other atherosclerosis of native arteries of extremities, bilateral legs: Secondary | ICD-10-CM | POA: Diagnosis not present

## 2016-11-27 DIAGNOSIS — L84 Corns and callosities: Secondary | ICD-10-CM | POA: Diagnosis not present

## 2016-11-27 DIAGNOSIS — L97521 Non-pressure chronic ulcer of other part of left foot limited to breakdown of skin: Secondary | ICD-10-CM | POA: Diagnosis not present

## 2016-11-27 DIAGNOSIS — M79672 Pain in left foot: Secondary | ICD-10-CM | POA: Diagnosis not present

## 2016-11-29 DIAGNOSIS — M5136 Other intervertebral disc degeneration, lumbar region: Secondary | ICD-10-CM | POA: Diagnosis not present

## 2016-12-11 DIAGNOSIS — L84 Corns and callosities: Secondary | ICD-10-CM | POA: Diagnosis not present

## 2016-12-11 DIAGNOSIS — I70293 Other atherosclerosis of native arteries of extremities, bilateral legs: Secondary | ICD-10-CM | POA: Diagnosis not present

## 2016-12-11 DIAGNOSIS — M2042 Other hammer toe(s) (acquired), left foot: Secondary | ICD-10-CM | POA: Diagnosis not present

## 2016-12-11 DIAGNOSIS — M79672 Pain in left foot: Secondary | ICD-10-CM | POA: Diagnosis not present

## 2017-01-01 ENCOUNTER — Ambulatory Visit (INDEPENDENT_AMBULATORY_CARE_PROVIDER_SITE_OTHER): Payer: Medicare Other | Admitting: Pharmacist

## 2017-01-01 DIAGNOSIS — I482 Chronic atrial fibrillation, unspecified: Secondary | ICD-10-CM

## 2017-01-01 DIAGNOSIS — M5136 Other intervertebral disc degeneration, lumbar region: Secondary | ICD-10-CM | POA: Diagnosis not present

## 2017-01-01 LAB — PROTIME-INR: INR: 1.2 — AB (ref <=1.1)

## 2017-01-15 DIAGNOSIS — M5136 Other intervertebral disc degeneration, lumbar region: Secondary | ICD-10-CM | POA: Diagnosis not present

## 2017-01-15 DIAGNOSIS — R29898 Other symptoms and signs involving the musculoskeletal system: Secondary | ICD-10-CM | POA: Diagnosis not present

## 2017-01-15 DIAGNOSIS — M545 Low back pain: Secondary | ICD-10-CM | POA: Diagnosis not present

## 2017-01-16 DIAGNOSIS — I70293 Other atherosclerosis of native arteries of extremities, bilateral legs: Secondary | ICD-10-CM | POA: Diagnosis not present

## 2017-01-16 DIAGNOSIS — M79672 Pain in left foot: Secondary | ICD-10-CM | POA: Diagnosis not present

## 2017-01-16 DIAGNOSIS — M2042 Other hammer toe(s) (acquired), left foot: Secondary | ICD-10-CM | POA: Diagnosis not present

## 2017-01-16 DIAGNOSIS — L602 Onychogryphosis: Secondary | ICD-10-CM | POA: Diagnosis not present

## 2017-01-16 DIAGNOSIS — L84 Corns and callosities: Secondary | ICD-10-CM | POA: Diagnosis not present

## 2017-02-04 ENCOUNTER — Ambulatory Visit (INDEPENDENT_AMBULATORY_CARE_PROVIDER_SITE_OTHER): Payer: Medicare Other | Admitting: Pharmacist Clinician (PhC)/ Clinical Pharmacy Specialist

## 2017-02-04 DIAGNOSIS — I482 Chronic atrial fibrillation, unspecified: Secondary | ICD-10-CM

## 2017-02-04 DIAGNOSIS — M5136 Other intervertebral disc degeneration, lumbar region: Secondary | ICD-10-CM | POA: Diagnosis not present

## 2017-02-04 LAB — POCT INR: INR: 1.1

## 2017-02-13 ENCOUNTER — Emergency Department (HOSPITAL_COMMUNITY): Payer: Medicare Other

## 2017-02-13 ENCOUNTER — Encounter (HOSPITAL_COMMUNITY): Payer: Self-pay | Admitting: Emergency Medicine

## 2017-02-13 ENCOUNTER — Emergency Department (HOSPITAL_COMMUNITY)
Admission: EM | Admit: 2017-02-13 | Discharge: 2017-02-13 | Disposition: A | Discharge status: Home / Self Care | Payer: Medicare Other | Attending: Emergency Medicine | Admitting: Emergency Medicine

## 2017-02-13 DIAGNOSIS — Z96642 Presence of left artificial hip joint: Secondary | ICD-10-CM | POA: Insufficient documentation

## 2017-02-13 DIAGNOSIS — L0390 Cellulitis, unspecified: Secondary | ICD-10-CM

## 2017-02-13 DIAGNOSIS — Z7901 Long term (current) use of anticoagulants: Secondary | ICD-10-CM | POA: Diagnosis not present

## 2017-02-13 DIAGNOSIS — L089 Local infection of the skin and subcutaneous tissue, unspecified: Secondary | ICD-10-CM | POA: Insufficient documentation

## 2017-02-13 DIAGNOSIS — Z96653 Presence of artificial knee joint, bilateral: Secondary | ICD-10-CM | POA: Insufficient documentation

## 2017-02-13 DIAGNOSIS — I70293 Other atherosclerosis of native arteries of extremities, bilateral legs: Secondary | ICD-10-CM | POA: Diagnosis not present

## 2017-02-13 DIAGNOSIS — L03032 Cellulitis of left toe: Secondary | ICD-10-CM | POA: Diagnosis not present

## 2017-02-13 DIAGNOSIS — I10 Essential (primary) hypertension: Secondary | ICD-10-CM | POA: Diagnosis not present

## 2017-02-13 DIAGNOSIS — L03116 Cellulitis of left lower limb: Secondary | ICD-10-CM | POA: Insufficient documentation

## 2017-02-13 DIAGNOSIS — Z79899 Other long term (current) drug therapy: Secondary | ICD-10-CM | POA: Diagnosis not present

## 2017-02-13 DIAGNOSIS — M79672 Pain in left foot: Secondary | ICD-10-CM | POA: Diagnosis not present

## 2017-02-13 DIAGNOSIS — L97521 Non-pressure chronic ulcer of other part of left foot limited to breakdown of skin: Secondary | ICD-10-CM | POA: Diagnosis not present

## 2017-02-13 LAB — I-STAT CHEM 8, ED
BUN: 24 mg/dL — AB (ref 6–20)
CREATININE: 0.8 mg/dL (ref 0.44–1.00)
Calcium, Ion: 1.11 mmol/L — ABNORMAL LOW (ref 1.15–1.40)
Chloride: 101 mmol/L (ref 101–111)
GLUCOSE: 296 mg/dL — AB (ref 65–99)
HEMATOCRIT: 48 % — AB (ref 36.0–46.0)
Hemoglobin: 16.3 g/dL — ABNORMAL HIGH (ref 12.0–15.0)
Potassium: 3.7 mmol/L (ref 3.5–5.1)
Sodium: 141 mmol/L (ref 135–145)
TCO2: 24 mmol/L (ref 0–100)

## 2017-02-13 LAB — CBC WITH DIFFERENTIAL/PLATELET
BASOS PCT: 0 %
Basophils Absolute: 0 10*3/uL (ref 0.0–0.1)
Eosinophils Absolute: 0.2 10*3/uL (ref 0.0–0.7)
Eosinophils Relative: 2 %
HEMATOCRIT: 48.5 % — AB (ref 36.0–46.0)
HEMOGLOBIN: 16.3 g/dL — AB (ref 12.0–15.0)
LYMPHS ABS: 1.6 10*3/uL (ref 0.7–4.0)
LYMPHS PCT: 18 %
MCH: 31.9 pg (ref 26.0–34.0)
MCHC: 33.6 g/dL (ref 30.0–36.0)
MCV: 94.9 fL (ref 78.0–100.0)
MONO ABS: 0.6 10*3/uL (ref 0.1–1.0)
MONOS PCT: 7 %
NEUTROS ABS: 6.4 10*3/uL (ref 1.7–7.7)
NEUTROS PCT: 73 %
Platelets: 170 10*3/uL (ref 150–400)
RBC: 5.11 MIL/uL (ref 3.87–5.11)
RDW: 13.9 % (ref 11.5–15.5)
WBC: 8.8 10*3/uL (ref 4.0–10.5)

## 2017-02-13 MED ORDER — CLINDAMYCIN HCL 150 MG PO CAPS: 150.0000 mg | ORAL_CAPSULE | Freq: Four times a day (QID) | ORAL | 0 refills | Status: DC

## 2017-02-13 MED ORDER — HYDROCODONE-ACETAMINOPHEN 5-325 MG PO TABS: 1.0000 | ORAL_TABLET | Freq: Four times a day (QID) | ORAL | 0 refills | Status: DC | PRN

## 2017-02-13 MED ORDER — CLINDAMYCIN HCL 150 MG PO CAPS
150.0000 mg | ORAL_CAPSULE | Freq: Three times a day (TID) | ORAL | Status: DC
Start: 2017-02-13 — End: 2017-02-14
  Administered 2017-02-13: 150 mg via ORAL
  Filled 2017-02-13: qty 1

## 2017-02-13 NOTE — ED Notes (Signed)
Pt has darkened scab to L outer ankle bone. Outer edges are reddened. Pt has cyanosis to middle toe. Pt also has small scab to left pinky toe.

## 2017-02-13 NOTE — ED Provider Notes (Signed)
[facility] DEPT Provider Note   CSN: [id number] Arrival date & time: 02/13/17  1631     History   Chief Complaint Chief Complaint  Patient presents with  . Foot Pain  . Toe Pain    HPI [name] is a 83 y.o. female.  83 year old female with a history of circulation problems in her feet and toes.  She's been seeing a podiatrist for several years.  Today she was seen by one of her podiatrist partners for sore on her left third toe with some swelling and erythema to the dorsal aspect of the foot and immediately sent her to the hospital for further evaluation. She states that she noticed the redness and swelling of her toe today.  It only hurts when she covers it with her shoe.  She states is a small amount of serous drainage on her sock into the day for the past couple days Denies any fever or chills      Past Medical History:  Diagnosis Date  . Anemia   . Arthritis   . Dysrhythmia    hx brief AF post op hip surg  . GERD (gastroesophageal reflux disease)   . Hypertension     Patient Active Problem List   Diagnosis Date Noted  . Long term (current) use of anticoagulants [Z79.01] 12/13/2015  . PAD (peripheral artery disease) ([place]) 10/05/2015  . ULCER OF OTHER PART OF FOOT 06/06/2010  . ESSENTIAL HYPERTENSION 03/26/2010  . ATRIAL FIBRILLATION 03/26/2010  . CUTANEOUS ERUPTIONS, DRUG-INDUCED 03/26/2010  . DIARRHEA 03/26/2010  . GERD 03/20/2010  . UNSPEC LOCAL INFECTION SKIN&SUBCUTANEOUS TISSUE 03/20/2010  . OSTEOARTHRITIS 03/20/2010  . ARTHRITIS, SEPTIC 03/14/2010  . OTH COMPLICATIONS DUE INTERNAL JOINT PROSTHESIS 02/22/2010    Past Surgical History:  Procedure Laterality Date  . AMPUTATION Right 02/04/2013   Procedure: [name] ;  Surgeon: Wylene Simmer, MD;  Location: [facility];  Service: Orthopedics;  Laterality: Right;  . APPENDECTOMY    . COLONOSCOPY    . EYE SURGERY     both cataracts  . INCISION AND DRAINAGE  2011   left hip inf-hemovac  . KNEE ARTHROSCOPY  1995   left  . NECK MASS EXCISION    . TONSILLECTOMY    . TOTAL HIP ARTHROPLASTY  2006   left-fx  . TOTAL KNEE ARTHROPLASTY  [id number]   left  . TOTAL KNEE ARTHROPLASTY  2009   right    OB History    No data available       Home Medications    Prior to Admission medications   Medication Sig Start Date End Date Taking? Authorizing Provider  acetaminophen (TYLENOL) 325 MG tablet Take 650 mg by mouth 2 (two) times daily as needed for mild pain.   Yes Historical Provider, MD  calcium carbonate (CALCIUM 500) 1250 MG tablet Take 1 tablet by mouth daily with breakfast.    Yes Historical Provider, MD  cholecalciferol (VITAMIN D) 1000 units tablet Take 1,000 Units by mouth daily.   Yes Historical Provider, MD  warfarin (COUMADIN) 2.5 MG tablet Take 2.5 mg by mouth daily at 6 PM.    Yes Historical Provider, MD  clindamycin (CLEOCIN) 150 MG capsule Take 1 capsule (150 mg total) by mouth every 6 (six) hours. 02/13/17   Junius Creamer, NP  HYDROcodone-acetaminophen (NORCO/VICODIN) 5-325 MG tablet Take 1 tablet by mouth every 6 (six) hours as needed for severe pain. 02/13/17   Junius Creamer, NP    Family History  Family History  Problem Relation Age of Onset  . Cancer Mother     Social History Social History  Substance Use Topics  . Smoking status: Never Smoker  . Smokeless tobacco: Never Used  . Alcohol use No     Allergies   Ceftriaxone sodium; Sulfonamide derivatives; and Vancomycin hcl   Review of Systems Review of Systems  Constitutional: Negative for chills and fever.  Musculoskeletal: Positive for joint swelling.  Skin: Positive for wound.  Neurological: Negative for numbness.  All other systems reviewed and are negative.    Physical Exam Updated Vital Signs BP (!) 165/118   Pulse (!) 49   Temp 98.1 F (36.7 C) (Oral)   Resp 18   Ht 5\' 3"  (1.6 m)   Wt 49.9 kg   SpO2 100%   BMI 19.49 kg/m   Physical Exam    Constitutional: She is oriented to person, place, and time. She appears well-developed.  HENT:  Head: Normocephalic.  Eyes: Pupils are equal, round, and reactive to light.  Cardiovascular: Normal rate.   hypertensive  Pulmonary/Chest: Effort normal.  Musculoskeletal: She exhibits tenderness.       Feet:  Plantar aspect of left third oh with scab edema of the toe with erythema that extends to mid foot into the lateral aspect of foot  Neurological: She is alert and oriented to person, place, and time.  Skin: Skin is warm and dry. There is erythema.  Psychiatric: She has a normal mood and affect.  Nursing note and vitals reviewed.    ED Treatments / Results  Labs (all labs ordered are listed, but only abnormal results are displayed) Labs Reviewed  CBC WITH DIFFERENTIAL/PLATELET - Abnormal; Notable for the following:       Result Value   Hemoglobin 16.3 (*)    HCT 48.5 (*)    All other components within normal limits  I-STAT CHEM 8, ED - Abnormal; Notable for the following:    BUN 24 (*)    Glucose, Bld 296 (*)    Calcium, Ion 1.11 (*)    Hemoglobin 16.3 (*)    HCT 48.0 (*)    All other components within normal limits    EKG  EKG Interpretation None       Radiology Dg Foot Complete Left  Result Date: 02/13/2017 CLINICAL DATA:  Third toe cellulitis. EXAM: LEFT FOOT - COMPLETE 3+ VIEW COMPARISON:  LEFT foot radiograph August 09, 2015 FINDINGS: No fracture deformity or dislocation. Osteopenia. Flexed toes suggests hammertoe deformity. Small plantar calcaneal spur. Mild forefoot soft tissue swelling without subcutaneous gas or radiopaque foreign bodies. IMPRESSION: Mild forefoot soft tissue swelling.  No acute osseous process. Electronically Signed   By: Elon Alas M.D.   On: 02/13/2017 22:29    Procedures Procedures (including critical care time)  Medications Ordered in ED Medications  clindamycin (CLEOCIN) capsule 150 mg (not administered)     Initial  Impression / Assessment and Plan / ED Course  I have reviewed the triage vital signs and the nursing notes.  Pertinent labs & imaging results that were available during my care of the patient were reviewed by me and considered in my medical decision making (see chart for details).      Will obtain CBC i-STAT and x-ray of foot Labwork and x-ray showed no bony involvement or elevated white count.  She's been started on clindamycin due to her penicillin allergy for 10 days.  She's been given a prescription for Norco for pain control  with instructions to follow-up with her primary care physician or podiatrist in 3-4 days.  Final Clinical Impressions(s) / ED Diagnoses   Final diagnoses:  Toe infection  Cellulitis, unspecified cellulitis site    New Prescriptions New Prescriptions   CLINDAMYCIN (CLEOCIN) 150 MG CAPSULE    Take 1 capsule (150 mg total) by mouth every 6 (six) hours.   HYDROCODONE-ACETAMINOPHEN (NORCO/VICODIN) 5-325 MG TABLET    Take 1 tablet by mouth every 6 (six) hours as needed for severe pain.     Junius Creamer, NP 02/13/17 [place], DO 02/13/17 [id number]

## 2017-02-13 NOTE — Discharge Instructions (Signed)
You have been given a prescription for antibiotic called clindamycin.  Please take this as directed until all tablets have been completed.  You've also been given Vicodin or hydrocodone for pain control and take this in conjunction with ibuprofen. Please follow-up with your primary care physician in 3-4 days Today your labwork showed no sign of systemic infection.  The x-ray of your foot showed no bone involvement.

## 2017-02-13 NOTE — ED Triage Notes (Signed)
Pt. Stated, Donnald Garre had this bad bad foot/toe  For about 6 months. Go to foot doctor and he said it was infected the middle toe and needed to come to ED, Foot is wrapped with a shoe/boot.

## 2017-02-18 ENCOUNTER — Ambulatory Visit (INDEPENDENT_AMBULATORY_CARE_PROVIDER_SITE_OTHER): Payer: Medicare Other | Admitting: Podiatry

## 2017-02-18 DIAGNOSIS — I83025 Varicose veins of left lower extremity with ulcer other part of foot: Secondary | ICD-10-CM | POA: Diagnosis not present

## 2017-02-18 DIAGNOSIS — L97523 Non-pressure chronic ulcer of other part of left foot with necrosis of muscle: Secondary | ICD-10-CM

## 2017-02-18 DIAGNOSIS — M86172 Other acute osteomyelitis, left ankle and foot: Secondary | ICD-10-CM | POA: Diagnosis not present

## 2017-02-18 DIAGNOSIS — L97529 Non-pressure chronic ulcer of other part of left foot with unspecified severity: Secondary | ICD-10-CM

## 2017-02-19 ENCOUNTER — Telehealth: Payer: Self-pay | Admitting: *Deleted

## 2017-02-19 NOTE — Telephone Encounter (Signed)
"  [name] is to be scheduled for surgery with [name].  I would appreciate if you would call me.  I am her daughter -in-law.  If you have to call her to get her to give permission to do this that is fine.  She will be very confused trying to schedule this herself.  I will be taking her for the surgery.  Please give me a call."

## 2017-02-20 NOTE — Telephone Encounter (Signed)
I'm returning your call.  You wanted to schedule surgery for Cathie Beams?  "Yes, I appreciate you calling me back."  Do you have a date in mind that you would like to schedule it?  "I would like to schedule it for next week."  He does not have anything next week.  He can do it the following week, April 12.  "He said it needed to be done right away.  She has a dead toe that needs to come off."  He doesn't have anything next week.  "Okay, put her down for that day."  Someone from the surgical center will call you with the arrival time a day or two prior to surgery date.  You can go ahead and register with the surgical center instructions are in the brochure in her blue bag with the scrub brush and gel pack.  "I don't think we received the brochure but I will check when I see her."

## 2017-02-20 NOTE — Telephone Encounter (Signed)
I am calling to let you know that I spoke to [name].  He does want to do [facility]'s surgery next week.  He wants to do it on Monday.  It will be scheduled around lunch time.  Someone from the surgical center should give you a call on tomorrow with her arrival time.  "Okay, thanks so much for letting me know, that date will be fine."  Please register with the surgical center as soon as possible.  "Okay, I will take care of it."

## 2017-02-20 NOTE — Progress Notes (Signed)
   Subjective:  83 year old female presents to the office today with her daughter and daughter-in-law for evaluation of an ulcer to the third digit left foot that she has noticed for approximately one week now. Patient was seen in the emergency department approximate 5 days ago and upon discharge was instructed to follow up here in the office. X-rays taken in the emergency department.    Objective/Physical Exam General: The patient is alert and oriented x3 in no acute distress.  Dermatology: Ulcer noted to the distal tuft of the third digit left foot measuring approximately [phone] cm. To the noted ulceration there is no eschar. There is extensive amount of slough fibrin and necrotic tissue noted. There is exposed bone noted. There is a minimal amount of serosanguineous drainage noted. Periwound integrity is intact with localized erythema about the third digit.  Vascular: Palpable pedal pulses bilaterally. No edema or erythema noted. Capillary refill within normal limits.  Neurological: Epicritic and protective threshold diminished bilaterally.   Musculoskeletal Exam: Range of motion within normal limits to all pedal and ankle joints bilateral. Muscle strength 5/5 in all groups bilateral.   Radiographic Exam:  Upon evaluation of the radiographic exam taken in the emergency department compared with x-rays taken last year there is positive findings and evidence of osteolytic process to the distal phalanx third digit left foot consistent with osteomyelitis.   Assessment: #1 Osteomyelitis third digit left foot with open ulcer   Plan of Care:  #1 Patient was evaluated. #2 Today I discussed with the patient and her family that she will require medically necessary amputation of the third digit left foot. Patient has a history of amputation to the fifth digit right foot. The patient and the family all agree that this is the best option for the patient. #3 authorization for surgery initiated  today. Surgery will consist of amputation third digit left foot with incision and drainage left foot. #4 medically necessary excisional debridement including muscle and deep fascia was performed to the ulcer using a tissue nipper. Excisional debridement of all the necrotic nonviable tissue down to healthy bleeding viable tissue was performed with post-debridement measurements same as pre-.  return to clinic 1 week postop   [name], DPM [facility]  Dr. [name], [address]                                        [place], [place] [address]                Office [phone]  Fax [phone]

## 2017-02-22 LAB — WOUND CULTURE

## 2017-02-24 ENCOUNTER — Encounter: Payer: Self-pay | Admitting: Podiatry

## 2017-02-24 ENCOUNTER — Other Ambulatory Visit: Payer: Self-pay | Admitting: Podiatry

## 2017-02-24 ENCOUNTER — Telehealth: Payer: Self-pay | Admitting: *Deleted

## 2017-02-24 DIAGNOSIS — M65072 Abscess of tendon sheath, left ankle and foot: Secondary | ICD-10-CM | POA: Diagnosis not present

## 2017-02-24 DIAGNOSIS — M868X7 Other osteomyelitis, ankle and foot: Secondary | ICD-10-CM | POA: Diagnosis not present

## 2017-02-24 DIAGNOSIS — M86672 Other chronic osteomyelitis, left ankle and foot: Secondary | ICD-10-CM | POA: Diagnosis not present

## 2017-02-24 DIAGNOSIS — L089 Local infection of the skin and subcutaneous tissue, unspecified: Secondary | ICD-10-CM | POA: Diagnosis not present

## 2017-02-24 DIAGNOSIS — I739 Peripheral vascular disease, unspecified: Secondary | ICD-10-CM

## 2017-02-24 DIAGNOSIS — I10 Essential (primary) hypertension: Secondary | ICD-10-CM | POA: Diagnosis not present

## 2017-02-24 MED ORDER — AMOXICILLIN-POT CLAVULANATE 875-125 MG PO TABS: 1.0000 | ORAL_TABLET | Freq: Two times a day (BID) | ORAL | 0 refills | Status: DC

## 2017-02-24 NOTE — Progress Notes (Unsigned)
Dos

## 2017-02-24 NOTE — Telephone Encounter (Addendum)
-----   Message from [name], DPM sent at 02/24/2017  1:30 PM EDT ----- Regarding: Vascular Consult I just did a toe amputation on this patient third digit left foot. Minimal bleeding noted. Please place vascular consult ASAP, preferably to Dr. Darrall Dears, [facility].  Diagnosis: Peripheral vascular disease  Thanks, [name]. Faxed referral, clinical and demographics to [facility]Inter[facility]iology. 03/05/2017-Faxed copy of 03/05/2017 8:00am orders, LOV, demographics to [facility]. 03/07/2017-Cheryl Foster, [place] states they will have to order the Prisma or their substitute BioStat AG and Aquacell, but will be available for pt's next dressing change. Malachy Mood states pt still has some Iodoflex, I told her to place that on the wounds until the other medications were available. 03/10/2017-Left message informing [facility] care the substitute for Prisma,  BioStat AG was fine.

## 2017-02-25 ENCOUNTER — Ambulatory Visit
Admission: RE | Admit: 2017-02-25 | Discharge: 2017-02-25 | Disposition: A | Discharge status: Home / Self Care | Payer: Medicare Other | Source: Ambulatory Visit | Attending: Podiatry | Admitting: Podiatry

## 2017-02-25 DIAGNOSIS — I739 Peripheral vascular disease, unspecified: Secondary | ICD-10-CM | POA: Diagnosis not present

## 2017-02-26 ENCOUNTER — Ambulatory Visit
Admission: RE | Admit: 2017-02-26 | Discharge: 2017-02-26 | Disposition: A | Discharge status: Home / Self Care | Payer: Medicare Other | Source: Ambulatory Visit | Attending: Podiatry | Admitting: Podiatry

## 2017-02-26 ENCOUNTER — Other Ambulatory Visit: Payer: Self-pay | Admitting: Interventional Radiology

## 2017-02-26 DIAGNOSIS — IMO0002 Reserved for concepts with insufficient information to code with codable children: Secondary | ICD-10-CM

## 2017-02-26 DIAGNOSIS — I739 Peripheral vascular disease, unspecified: Secondary | ICD-10-CM

## 2017-02-26 HISTORY — PX: IR RADIOLOGIST EVAL & MGMT: IMG5224

## 2017-02-26 NOTE — Consult Note (Signed)
Chief Complaint: Left foot post op wound. Leg pain.   Referring Physician(s): Evans,Brent M  History of Present Illness: [name] is a 83 y.o. female presenting today to vascular interventional radiology clinic as a scheduled appointment, kindly referred by Dr. Daylene Katayama of [facility], for evaluation of postoperative wound of the left foot, third digit amputation. This was performed 02/18/2017.  [name] tells me that she has had multiple wounds of the bilateral feet in the past, both right and left. She is somewhat uncertain of the length of time that ulcer was present on the treated toe, as she has decreased sensation and does not notice any pain with ulceration.  She noticed the first wound on her right lower extremity at least 5 years ago.  She is been treated with a prior fifth digit amputation on the right in 2013, though it is uncertain if this was secondary to poor blood flow. She had a fracture at the time, and tells me this was amputated because of the fracture.  She does complain that she has bilateral calf pain and foot pain that sometimes wakes her at night. In order to be comfortable, she does ambulate around the house.   Before the current amputation, she was ambulating at her house and outside performing yardwork. She tries to say quite active, and further amputation would certainly be restrictive to her lifestyle.  Her cardiovascular risk factors are few: She is a nonsmoker with no diabetes.  Her HTN is well controlled.     PCP: Dr. Leanna Battles Podiatrist: Dr Amalia Hailey     Past Medical History:  Diagnosis Date  . Anemia   . Arthritis   . Dysrhythmia    hx brief AF post op hip surg  . GERD (gastroesophageal reflux disease)   . Hypertension     Past Surgical History:  Procedure Laterality Date  . AMPUTATION Right 02/04/2013   Procedure: [name] ;  Surgeon: Wylene Simmer, MD;  Location: [facility];   Service: Orthopedics;  Laterality: Right;  . APPENDECTOMY    . COLONOSCOPY    . EYE SURGERY     both cataracts  . INCISION AND DRAINAGE  2011   left hip inf-hemovac  . KNEE ARTHROSCOPY  1995   left  . NECK MASS EXCISION    . TONSILLECTOMY    . TOTAL HIP ARTHROPLASTY  2006   left-fx  . TOTAL KNEE ARTHROPLASTY  [id number]   left  . TOTAL KNEE ARTHROPLASTY  2009   right    Allergies: Ceftriaxone sodium; Sulfonamide derivatives; and Vancomycin hcl  Medications: Prior to Admission medications   Medication Sig Start Date End Date Taking? Authorizing Provider  acetaminophen (TYLENOL) 325 MG tablet Take 650 mg by mouth 2 (two) times daily as needed for mild pain.   Yes Historical Provider, MD  amoxicillin-clavulanate (AUGMENTIN) 875-125 MG tablet Take 1 tablet by mouth 2 (two) times daily. 02/24/17  Yes [name], DPM  calcium carbonate (CALCIUM 500) 1250 MG tablet Take 1 tablet by mouth daily with breakfast.    Yes Historical Provider, MD  cholecalciferol (VITAMIN D) 1000 units tablet Take 1,000 Units by mouth daily.   Yes Historical Provider, MD  traMADol (ULTRAM) 50 MG tablet Take 1 mg by mouth every 6 (six) hours as needed.   Yes Historical Provider, MD  warfarin (COUMADIN) 2.5 MG tablet Take 2.5 mg by mouth daily at 6 PM.  Yes Historical Provider, MD  clindamycin (CLEOCIN) 150 MG capsule Take 1 capsule (150 mg total) by mouth every 6 (six) hours. Patient not taking: Reported on 02/26/2017 02/13/17   Junius Creamer, NP  HYDROcodone-acetaminophen (NORCO/VICODIN) 5-325 MG tablet Take 1 tablet by mouth every 6 (six) hours as needed for severe pain. Patient not taking: Reported on 02/26/2017 02/13/17   Junius Creamer, NP     Family History  Problem Relation Age of Onset  . Cancer Mother     Social History   Social History  . Marital status: Married    Spouse name: N/A  . Number of children: N/A  . Years of education: N/A   Social History Main Topics  . Smoking status: Never Smoker   . Smokeless tobacco: Never Used  . Alcohol use No  . Drug use: No  . Sexual activity: Not Asked   Other Topics Concern  . None   Social History Narrative  . None   Review of Systems: A 12 point ROS discussed and pertinent positives are indicated in the HPI above.  All other systems are negative.  Review of Systems  Vital Signs: BP 117/74 (BP Location: Right Arm, Patient Position: Sitting, Cuff Size: Normal)   Pulse 80   Temp 97.7 F (36.5 C) (Oral)   Resp 15   Ht 5\' 3"  (1.6 m)   Wt 115 lb (52.2 kg)   SpO2 98%   BMI 20.37 kg/m   Physical Exam  General: 83 yo female appearing younger than stated age.  Well-developed, well-nourished.  No distress. HEENT: Atraumatic, normocephalic.  Conjugate gaze, extra-ocular motor intact. No scleral icterus or scleral injection. No lesions on external ears, nose, lips, or gums.  Oral mucosa moist, pink.  Neck: Symmetric with no goiter enlargement.  Chest/Lungs:  Symmetric chest with inspiration/expiration.  No labored breathing.  Clear to auscultation with no wheezes, rhonchi, or rales.  Heart:  Irregular HR, with no third heart sounds appreciated. No JVD appreciated.  Abdomen:  Soft, NT/ND, with + bowel sounds.   Genito-urinary: Deferred Neurologic: Alert & Oriented to person, place, and time.   Normal affect and insight.  Appropriate questions.  Moving all 4 extremities with gross sensory intact.  LE Exam:     NP pulses on the left and right feet.  Doppler + at the right DP & PT.   Doppler + at the left DP & PT She has a surgical dressing in place on the left forefoot, at site of prior amputation.  I have elected to the leave the dressing in place.  Non-palpable left DP and PT.  Dopper + On the right she has a well-healed wound from prior 5th toe amputation.     Mallampati Score: 3  Imaging: US Arterial Seg Multiple  Result Date: 02/25/2017 CLINICAL DATA:  83 year old female with a history of vascular disease and prior amputation of  right fifth digit 2013, left third digit 02/24/2017. Cardiovascular risk factors include hypertension. EXAM: NONINVASIVE PHYSIOLOGIC VASCULAR STUDY OF BILATERAL LOWER EXTREMITIES TECHNIQUE: Evaluation of both lower extremities was performed at rest, including calculation of ankle-brachial indices, multiple segmental pressure evaluation, segmental Doppler and segmental pulse volume recording. COMPARISON:  None. FINDINGS: Right: Resting ankle brachial index:  Noncompressible vessels. Segmental blood pressure: Segmental pressures of the upper extremity are symmetric. Majority of the right lower extremity noncompressible. Right great toe measures 91 systolic Doppler: Segmental Doppler of the right lower extremity demonstrates triphasic common femoral artery and popliteal artery. Monophasic tibial waveforms of  posterior tibial artery and dorsalis pedis. Pulse volume recording: Segmental PVR of the right lower extremity demonstrates waveform amplitude and quality maintained in the high thigh, with loss of augmentation. Further deterioration at the ankle and digital segment. Left: Resting ankle brachial index: 0.78 Segmental blood pressure: Symmetric upper extremity pressures. Forty point systolic drop from high thigh to low thigh, with further drop of the segmental pressure at the calf. Ankle pressure measures 329 systolic. Left great toe pressure undetectable, with no history of left great toe amputation. Doppler: Segmental Doppler of the left lower extremity demonstrates triphasic femoral artery. Monophasic popliteal artery, posterior tibial artery, and dorsalis pedis. Pulse volume recording: Segmental PVR of the left lower extremity demonstrates waveform quality and amplitude maintained in the high thigh with loss of augmentation and further deterioration at the ankle. Flat line at the left great toe. Additional: IMPRESSION: Right: Resting ankle-brachial index is non calculable given the noncompressible vessels.  Segmental exam demonstrates evidence of nonocclusive femoral popliteal disease, as well as developing tibial disease. Systolic pressure of the right great toe would confer positive healing capability, however, may be falsely elevated. Left: Resting ankle-brachial index in the mild range arterial occlusive disease, though would likely decrease after exercise exam. Segmental exam demonstrates femoral popliteal disease, with likely high-grade stenosis or occlusion of femoral popliteal lesion, as well as developing tibial and small vessel disease. Flat line waveform PPG of the left great toe and unobtainable systolic pressure of the great toe confers poor healing capability. Signed, Dulcy Fanny. Earleen Newport, DO [facility] Radiology Electronically Signed   By: Corrie Mckusick D.O.   On: 02/25/2017 14:58   Dg Foot Complete Left  Result Date: 02/13/2017 CLINICAL DATA:  Third toe cellulitis. EXAM: LEFT FOOT - COMPLETE 3+ VIEW COMPARISON:  LEFT foot radiograph August 09, 2015 FINDINGS: No fracture deformity or dislocation. Osteopenia. Flexed toes suggests hammertoe deformity. Small plantar calcaneal spur. Mild forefoot soft tissue swelling without subcutaneous gas or radiopaque foreign bodies. IMPRESSION: Mild forefoot soft tissue swelling.  No acute osseous process. Electronically Signed   By: Elon Alas M.D.   On: 02/13/2017 22:29    Labs:  CBC:  Recent Labs  02/13/17 2224 02/13/17 2236  WBC 8.8  --   HGB 16.3* 16.3*  HCT 48.5* 48.0*  PLT 170  --     COAGS:  Recent Labs  01/01/17 02/04/17 0935  INR 1.2* 1.1    BMP:  Recent Labs  02/13/17 2236  NA 141  K 3.7  CL 101  GLUCOSE 296*  BUN 24*  CREATININE 0.80    LIVER FUNCTION TESTS: No results for input(s): BILITOT, AST, ALT, ALKPHOS, PROT, ALBUMIN in the last 8760 hours.  TUMOR MARKERS: No results for input(s): AFPTM, CEA, CA199, CHROMGRNA in the last 8760 hours.  Assessment and  Plan:  Assessment:  [name] is an 83 yo female presenting with history of osteomyelitis of left third toe, SP amputation on 02/18/2017.  She has additional history of several wounds of the left and right foot, compatible with Rutherford 5 class symptoms of CLI.    In addition to the wounds, she has complaints of rest pain.    Non-invasive lower extremity exam shows evidence of bilateral femoral-popliteal disease, as well as bilateral likely tibial disease.  Although the the left ABI is in the mild range, this may be falsely elevated.    I had a discussion with [name] and her family regarding anatomy, pathology/pathophysiology, natural history, and  prognosis of critical limb ischemia.  Informed consent regarding treatment strategies was performed in which I discussed possibly medical treatment and expectant therapy, versus endovascular options versus open surgical options. I gave her my impression that given what we know about critical limb ischemia, she may be at risk for further tissue loss/amputation, and that there is clear evidence supporting intervention for revascularization, with indications for treatment supported by updated guidelines1, 2.  I performed a complete informed consent regarding endovascular options, specific risks discussed include: bleeding, infection, contrast reaction, renal injury/nephropathy, arterial injury/dissection, need for additional procedure/surgery, worsening symptoms/tissue including limb loss, cardiopulmonary collapse, death.    I also discussed medical management, maximal medical therapy for reduction of risk factors is indicated as recommended by updated AHA guidelines1.  For her this would include anti-platelet medication, tight blood pressure control, and considering maximum-dose HMG-CoA reductase inhibitor.    I answered all of her and her family's questions to the best of my ability.  Plan: - Given the chance that she has significant tibial disease, I  told her that preoperative planning with anatomic imaging will be important, and we will proceed with ordering CT angiogram and runoff. - After the CT I will follow-up with her with the results to propose the treatment strategy for revascularization - Once we have a treatment strategy, we will need stop Coumadin, and we can confirm with her primary care physician whether she can be altogether removed preoperative timeframe or whether she should be bridged with Lovenox. - Continue maximal medical therapy for cardiovascular risk reduction, which for her would include considering antiplatelet (81 mg aspirin daily) and Hmg-CoA reductase inhibitor.  - I encouraged her to observe all of her upcoming appointments.    ___________________________________________________________________   1Morley Kos MD, et al. 2016 AHA/ACC Guideline on the Management of Patients With Lower Extremity Peripheral Artery Disease: Executive Summary: A Report of the American College of Cardiology/American Heart Association Task Force on Clinical Practice Guidelines. J Am Coll Cardiol. 2017 Mar 21;69(11):1465-1508. doi: 10.1016/j.jacc.2016.11.008.   2 - Norgren L, et al. TASC II Working Group. Inter-society consensus for the management of peripheral arterial disease. Int Tressia Miners. 2007 Jun;26(2):81-157. Review. PubMed PMID: 29562130  3 - Hingorani A, et al. The management of diabetic foot: A clinical practice guideline by the Society for Vascular Surgery in collaboration with the [place] and the Society  for Vascular Medicine. J Vasc Surg. 2016 Feb;63(2 Suppl):3S-21S. doi: 10.1016/j.jvs.2015.10.003. PubMed PMID: 86578469.   Thank you for this interesting consult.  I greatly enjoyed meeting [name] and look forward to participating in their care.  A copy of this report was sent to the requesting provider on this date.  Electronically Signed: Corrie Mckusick 02/26/2017, 4:11 PM   I spent a  total of  40 Minutes   in face to face in clinical consultation, greater than 50% of which was counseling/coordinating care for critical limb ischemia, peripheral arterial disease, postsurgical wound, possible angioplasty and intervention

## 2017-02-27 ENCOUNTER — Ambulatory Visit: Payer: Medicare Other | Admitting: Podiatry

## 2017-03-03 ENCOUNTER — Ambulatory Visit (INDEPENDENT_AMBULATORY_CARE_PROVIDER_SITE_OTHER): Payer: Medicare Other

## 2017-03-03 ENCOUNTER — Ambulatory Visit (INDEPENDENT_AMBULATORY_CARE_PROVIDER_SITE_OTHER): Payer: Medicare Other | Admitting: Podiatry

## 2017-03-03 DIAGNOSIS — E11621 Type 2 diabetes mellitus with foot ulcer: Secondary | ICD-10-CM | POA: Diagnosis not present

## 2017-03-03 DIAGNOSIS — Z9889 Other specified postprocedural states: Secondary | ICD-10-CM

## 2017-03-03 DIAGNOSIS — L97529 Non-pressure chronic ulcer of other part of left foot with unspecified severity: Secondary | ICD-10-CM | POA: Diagnosis not present

## 2017-03-03 DIAGNOSIS — I70245 Atherosclerosis of native arteries of left leg with ulceration of other part of foot: Secondary | ICD-10-CM

## 2017-03-03 DIAGNOSIS — M86672 Other chronic osteomyelitis, left ankle and foot: Secondary | ICD-10-CM | POA: Diagnosis not present

## 2017-03-03 DIAGNOSIS — L97322 Non-pressure chronic ulcer of left ankle with fat layer exposed: Secondary | ICD-10-CM | POA: Diagnosis not present

## 2017-03-03 MED ORDER — TRAMADOL HCL 50 MG PO TABS: 1.0000 mg | ORAL_TABLET | Freq: Four times a day (QID) | ORAL | 0 refills | Status: DC | PRN

## 2017-03-03 MED ORDER — SULFAMETHOXAZOLE-TRIMETHOPRIM 800-160 MG PO TABS: 1.0000 | ORAL_TABLET | Freq: Two times a day (BID) | ORAL | 0 refills | Status: DC

## 2017-03-04 ENCOUNTER — Ambulatory Visit (HOSPITAL_COMMUNITY)
Admission: RE | Admit: 2017-03-04 | Discharge: 2017-03-04 | Disposition: A | Discharge status: Home / Self Care | Payer: Medicare Other | Source: Ambulatory Visit | Attending: Interventional Radiology | Admitting: Interventional Radiology

## 2017-03-04 DIAGNOSIS — K8020 Calculus of gallbladder without cholecystitis without obstruction: Secondary | ICD-10-CM | POA: Diagnosis not present

## 2017-03-04 DIAGNOSIS — I739 Peripheral vascular disease, unspecified: Secondary | ICD-10-CM | POA: Diagnosis not present

## 2017-03-04 DIAGNOSIS — I708 Atherosclerosis of other arteries: Secondary | ICD-10-CM | POA: Diagnosis not present

## 2017-03-04 DIAGNOSIS — IMO0002 Reserved for concepts with insufficient information to code with codable children: Secondary | ICD-10-CM

## 2017-03-04 DIAGNOSIS — Z89421 Acquired absence of other right toe(s): Secondary | ICD-10-CM | POA: Insufficient documentation

## 2017-03-04 DIAGNOSIS — I701 Atherosclerosis of renal artery: Secondary | ICD-10-CM | POA: Insufficient documentation

## 2017-03-04 MED ORDER — IOPAMIDOL (ISOVUE-370) INJECTION 76%
INTRAVENOUS | Status: AC
  Administered 2017-03-04: 100 mL
  Filled 2017-03-04: qty 100

## 2017-03-05 NOTE — Telephone Encounter (Signed)
-----   Message from [name], DPM sent at 03/05/2017  8:00 AM EDT ----- Regarding: Home health care orders Please order home health care dressing changes. Patient is homebound Dx:  1. s/p third digit amputation left foot.  2. Ulcer fifth digit left foot 3. Ulcer left ankle  - Cleanse wound with normal saline. Dry. - Paint amputation incision site with Betadine.  - Applied Prisma with overlying Aquacel Ag to 5th digit and ankle ulcers - Dressed with dry sterile dressing  Thanks, [name]

## 2017-03-05 NOTE — Progress Notes (Signed)
   Subjective:  Patient presents today for follow-up evaluation and treatment of the third toe amputation left foot. Date of surgery 02/24/2017. Patient also complains of ulcers to the fifth digit left foot as well as the lateral aspect of the left ankle. Patient states that the other ulcers are significantly painful. Patient presents for further treatment and evaluation  Patient also states that she has an appointment with vascular on 03/04/2017, [facility] imaging with Dr. Jacqualyn Posey, for lower extremity vascular optimization.   Objective/Physical Exam General: The patient is alert and oriented x3 in no acute distress.  Dermatology:  Wound #1 noted to the fifth digit left foot measuring [phone] cm (LxWxD).   Wound #2 noted to the lateral aspect of the left ankle measuring 333.333.333.333 cm.   To the noted ulceration(s), there is no eschar. There is a moderate amount of slough, fibrin, and necrotic tissue noted. Moderate amount of purulent drainage also noted. Granulation tissue and wound base is pink.  There is no exposed bone muscle-tendon ligament or joint. There is no malodor. Periwound integrity is intact. Skin is warm, dry and supple bilateral lower extremities.  Vascular: Diminished pedal pulses bilaterally. No edema or erythema noted. Capillary refill delayed.  Neurological: Epicritic and protective threshold diminished bilaterally.   Musculoskeletal: Amputation site of the third digit left foot appears stable. Skin incision is well coapted with sutures intact. There is some maceration noted to the amputation site.  Assessment: #1 multiple ulcerations left foot and ankle secondary to diabetes mellitus #2 diabetes mellitus w/ peripheral neuropathy #3 peripheral vascular disease #4 status post third digit amputation left foot. Date of surgery 02/24/2017.  Plan of Care:  #1 Patient was evaluated. #2 medically necessary excisional debridement including muscle and deep fascia was  performed using a tissue nipper and a chisel blade. Excisional debridement of all the necrotic nonviable tissue down to healthy bleeding viable tissue was performed with post-debridement measurements same as pre-. #3 the wound was cleansed and dry sterile dressing applied. #4 appointment with [facility] imaging, Dr. Jacqualyn Posey 03/04/2017 #5 orders for home health care dressing changes are placed today. Betadine applied every other day to amputation site with Prisma and Aquacel Ag applied to the ulcers of the fifth digit and left ankle. 3x/week x 6 weeks.  #6 return to clinic in 2 weeks   [name], DPM [facility]  Dr. [name], [address]                                        [place], [place] [address]                Office [phone]  Fax [phone]

## 2017-03-07 DIAGNOSIS — I10 Essential (primary) hypertension: Secondary | ICD-10-CM | POA: Diagnosis not present

## 2017-03-07 DIAGNOSIS — L97329 Non-pressure chronic ulcer of left ankle with unspecified severity: Secondary | ICD-10-CM | POA: Diagnosis not present

## 2017-03-07 DIAGNOSIS — E1151 Type 2 diabetes mellitus with diabetic peripheral angiopathy without gangrene: Secondary | ICD-10-CM | POA: Diagnosis not present

## 2017-03-07 DIAGNOSIS — Z7901 Long term (current) use of anticoagulants: Secondary | ICD-10-CM | POA: Diagnosis not present

## 2017-03-07 DIAGNOSIS — L97529 Non-pressure chronic ulcer of other part of left foot with unspecified severity: Secondary | ICD-10-CM | POA: Diagnosis not present

## 2017-03-07 DIAGNOSIS — Z4781 Encounter for orthopedic aftercare following surgical amputation: Secondary | ICD-10-CM | POA: Diagnosis not present

## 2017-03-07 DIAGNOSIS — E11621 Type 2 diabetes mellitus with foot ulcer: Secondary | ICD-10-CM | POA: Diagnosis not present

## 2017-03-07 DIAGNOSIS — Z89422 Acquired absence of other left toe(s): Secondary | ICD-10-CM | POA: Diagnosis not present

## 2017-03-07 DIAGNOSIS — Z5181 Encounter for therapeutic drug level monitoring: Secondary | ICD-10-CM | POA: Diagnosis not present

## 2017-03-07 DIAGNOSIS — E1140 Type 2 diabetes mellitus with diabetic neuropathy, unspecified: Secondary | ICD-10-CM | POA: Diagnosis not present

## 2017-03-07 DIAGNOSIS — I4891 Unspecified atrial fibrillation: Secondary | ICD-10-CM | POA: Diagnosis not present

## 2017-03-07 DIAGNOSIS — K219 Gastro-esophageal reflux disease without esophagitis: Secondary | ICD-10-CM | POA: Diagnosis not present

## 2017-03-09 NOTE — Telephone Encounter (Signed)
Perfect, thanks. [name]

## 2017-03-10 ENCOUNTER — Encounter: Payer: Medicare Other | Admitting: Podiatry

## 2017-03-10 DIAGNOSIS — L97329 Non-pressure chronic ulcer of left ankle with unspecified severity: Secondary | ICD-10-CM | POA: Diagnosis not present

## 2017-03-10 DIAGNOSIS — L97529 Non-pressure chronic ulcer of other part of left foot with unspecified severity: Secondary | ICD-10-CM | POA: Diagnosis not present

## 2017-03-10 DIAGNOSIS — Z4781 Encounter for orthopedic aftercare following surgical amputation: Secondary | ICD-10-CM | POA: Diagnosis not present

## 2017-03-10 DIAGNOSIS — E1151 Type 2 diabetes mellitus with diabetic peripheral angiopathy without gangrene: Secondary | ICD-10-CM | POA: Diagnosis not present

## 2017-03-10 DIAGNOSIS — E1140 Type 2 diabetes mellitus with diabetic neuropathy, unspecified: Secondary | ICD-10-CM | POA: Diagnosis not present

## 2017-03-10 DIAGNOSIS — E11621 Type 2 diabetes mellitus with foot ulcer: Secondary | ICD-10-CM | POA: Diagnosis not present

## 2017-03-12 DIAGNOSIS — E1140 Type 2 diabetes mellitus with diabetic neuropathy, unspecified: Secondary | ICD-10-CM | POA: Diagnosis not present

## 2017-03-12 DIAGNOSIS — L97529 Non-pressure chronic ulcer of other part of left foot with unspecified severity: Secondary | ICD-10-CM | POA: Diagnosis not present

## 2017-03-12 DIAGNOSIS — E11621 Type 2 diabetes mellitus with foot ulcer: Secondary | ICD-10-CM | POA: Diagnosis not present

## 2017-03-12 DIAGNOSIS — E1151 Type 2 diabetes mellitus with diabetic peripheral angiopathy without gangrene: Secondary | ICD-10-CM | POA: Diagnosis not present

## 2017-03-12 DIAGNOSIS — Z4781 Encounter for orthopedic aftercare following surgical amputation: Secondary | ICD-10-CM | POA: Diagnosis not present

## 2017-03-12 DIAGNOSIS — L97329 Non-pressure chronic ulcer of left ankle with unspecified severity: Secondary | ICD-10-CM | POA: Diagnosis not present

## 2017-03-14 DIAGNOSIS — L97529 Non-pressure chronic ulcer of other part of left foot with unspecified severity: Secondary | ICD-10-CM | POA: Diagnosis not present

## 2017-03-14 DIAGNOSIS — L97329 Non-pressure chronic ulcer of left ankle with unspecified severity: Secondary | ICD-10-CM | POA: Diagnosis not present

## 2017-03-14 DIAGNOSIS — E11621 Type 2 diabetes mellitus with foot ulcer: Secondary | ICD-10-CM | POA: Diagnosis not present

## 2017-03-14 DIAGNOSIS — E1151 Type 2 diabetes mellitus with diabetic peripheral angiopathy without gangrene: Secondary | ICD-10-CM | POA: Diagnosis not present

## 2017-03-14 DIAGNOSIS — Z4781 Encounter for orthopedic aftercare following surgical amputation: Secondary | ICD-10-CM | POA: Diagnosis not present

## 2017-03-14 DIAGNOSIS — E1140 Type 2 diabetes mellitus with diabetic neuropathy, unspecified: Secondary | ICD-10-CM | POA: Diagnosis not present

## 2017-03-17 ENCOUNTER — Other Ambulatory Visit (HOSPITAL_COMMUNITY): Payer: Self-pay | Admitting: Interventional Radiology

## 2017-03-17 ENCOUNTER — Ambulatory Visit (INDEPENDENT_AMBULATORY_CARE_PROVIDER_SITE_OTHER): Payer: Medicare Other | Admitting: Podiatry

## 2017-03-17 DIAGNOSIS — E11621 Type 2 diabetes mellitus with foot ulcer: Secondary | ICD-10-CM

## 2017-03-17 DIAGNOSIS — I70245 Atherosclerosis of native arteries of left leg with ulceration of other part of foot: Secondary | ICD-10-CM

## 2017-03-17 DIAGNOSIS — L97322 Non-pressure chronic ulcer of left ankle with fat layer exposed: Secondary | ICD-10-CM

## 2017-03-17 DIAGNOSIS — Z9889 Other specified postprocedural states: Secondary | ICD-10-CM

## 2017-03-17 DIAGNOSIS — Z4781 Encounter for orthopedic aftercare following surgical amputation: Secondary | ICD-10-CM | POA: Diagnosis not present

## 2017-03-17 DIAGNOSIS — E1151 Type 2 diabetes mellitus with diabetic peripheral angiopathy without gangrene: Secondary | ICD-10-CM | POA: Diagnosis not present

## 2017-03-17 DIAGNOSIS — I739 Peripheral vascular disease, unspecified: Secondary | ICD-10-CM

## 2017-03-17 DIAGNOSIS — L97529 Non-pressure chronic ulcer of other part of left foot with unspecified severity: Secondary | ICD-10-CM

## 2017-03-17 DIAGNOSIS — L97329 Non-pressure chronic ulcer of left ankle with unspecified severity: Secondary | ICD-10-CM | POA: Diagnosis not present

## 2017-03-17 DIAGNOSIS — E1140 Type 2 diabetes mellitus with diabetic neuropathy, unspecified: Secondary | ICD-10-CM | POA: Diagnosis not present

## 2017-03-17 NOTE — Progress Notes (Signed)
   Subjective:  Patient presents today for follow-up evaluation and treatment of the third toe amputation left foot. Date of surgery 02/24/2017. Patient also complains of ulcers to the fifth digit left foot as well as the lateral aspect of the left ankle. Patient states that the other ulcers are significantly painful. Patient presents for further treatment and evaluation  Patient is currently being managed by vascular [facility] imaging. Patient and family states that she is waiting to have a procedure performed however she is waiting for medical clearance.   Objective/Physical Exam General: The patient is alert and oriented x3 in no acute distress.  Dermatology:  Wound #1 noted to the fifth digit left foot measuring 333.333.333.333 cm (LxWxD).   Wound #2 noted to the lateral aspect of the left ankle measuring 333.333.333.333 cm.   Wound #3 located to the third digit amputation stump measuring approximately [id number] cm.  To the noted ulceration(s), there is no eschar. There is a moderate amount of slough, fibrin, and necrotic tissue noted. Moderate amount of purulent drainage also noted. Granulation tissue and wound base is pink.  There is no exposed bone muscle-tendon ligament or joint. There is no malodor. Periwound integrity is intact. Skin is warm, dry and supple bilateral lower extremities.  Vascular: Diminished pedal pulses bilaterally. No edema or erythema noted. Capillary refill delayed.  Neurological: Epicritic and protective threshold diminished bilaterally.   Musculoskeletal: Amputation site of the third digit left foot appears stable. Skin incision is well coapted with sutures intact. There is some maceration noted to the amputation site.  Assessment: #1 multiple ulcerations left foot and ankle secondary to diabetes mellitus #2 diabetes mellitus w/ peripheral neuropathy #3 peripheral vascular disease #4 status post third digit amputation left foot. Date of surgery  02/24/2017.  Plan of Care:  #1 Patient was evaluated. #2 medically necessary excisional debridement including subcutaneous tissue was performed using a tissue nipper and a chisel blade. Excisional debridement of all the necrotic nonviable tissue down to healthy bleeding viable tissue was performed with post-debridement measurements same as pre-. #3 the wound was cleansed and dry sterile dressing applied. #4 vascular being managed by [facility] imaging, Dr. Jacqualyn Posey #5 continue home health dressing changes. Prisma and Aquacel Ag 3 times per week  #6 return to clinic in 2-3 weeks   [name], DPM [facility]  Dr. [name], [address]                                        [place], [place] [address]                Office [phone]  Fax [phone]

## 2017-03-18 ENCOUNTER — Encounter (HOSPITAL_COMMUNITY): Payer: Self-pay

## 2017-03-18 ENCOUNTER — Emergency Department (HOSPITAL_COMMUNITY)
Admission: EM | Admit: 2017-03-18 | Discharge: 2017-03-18 | Disposition: A | Discharge status: Home / Self Care | Payer: Medicare Other | Attending: Emergency Medicine | Admitting: Emergency Medicine

## 2017-03-18 DIAGNOSIS — R739 Hyperglycemia, unspecified: Secondary | ICD-10-CM | POA: Insufficient documentation

## 2017-03-18 DIAGNOSIS — R112 Nausea with vomiting, unspecified: Secondary | ICD-10-CM | POA: Diagnosis not present

## 2017-03-18 DIAGNOSIS — E871 Hypo-osmolality and hyponatremia: Secondary | ICD-10-CM | POA: Insufficient documentation

## 2017-03-18 DIAGNOSIS — G43A0 Cyclical vomiting, not intractable: Secondary | ICD-10-CM | POA: Insufficient documentation

## 2017-03-18 DIAGNOSIS — R531 Weakness: Secondary | ICD-10-CM | POA: Diagnosis not present

## 2017-03-18 DIAGNOSIS — E860 Dehydration: Secondary | ICD-10-CM | POA: Insufficient documentation

## 2017-03-18 DIAGNOSIS — R404 Transient alteration of awareness: Secondary | ICD-10-CM | POA: Diagnosis not present

## 2017-03-18 DIAGNOSIS — Z79899 Other long term (current) drug therapy: Secondary | ICD-10-CM | POA: Insufficient documentation

## 2017-03-18 DIAGNOSIS — R1115 Cyclical vomiting syndrome unrelated to migraine: Secondary | ICD-10-CM

## 2017-03-18 DIAGNOSIS — Z96642 Presence of left artificial hip joint: Secondary | ICD-10-CM | POA: Diagnosis not present

## 2017-03-18 DIAGNOSIS — E1165 Type 2 diabetes mellitus with hyperglycemia: Secondary | ICD-10-CM | POA: Diagnosis not present

## 2017-03-18 DIAGNOSIS — Z7901 Long term (current) use of anticoagulants: Secondary | ICD-10-CM | POA: Diagnosis not present

## 2017-03-18 DIAGNOSIS — Z96653 Presence of artificial knee joint, bilateral: Secondary | ICD-10-CM | POA: Diagnosis not present

## 2017-03-18 LAB — CBC WITH DIFFERENTIAL/PLATELET
Basophils Absolute: 0 10*3/uL (ref 0.0–0.1)
Basophils Relative: 0 %
Eosinophils Absolute: 0 10*3/uL (ref 0.0–0.7)
Eosinophils Relative: 0 %
HCT: 45.3 % (ref 36.0–46.0)
HEMOGLOBIN: 15.2 g/dL — AB (ref 12.0–15.0)
LYMPHS ABS: 1.7 10*3/uL (ref 0.7–4.0)
LYMPHS PCT: 14 %
MCH: 30.9 pg (ref 26.0–34.0)
MCHC: 33.6 g/dL (ref 30.0–36.0)
MCV: 92.1 fL (ref 78.0–100.0)
Monocytes Absolute: 1 10*3/uL (ref 0.1–1.0)
Monocytes Relative: 9 %
NEUTROS ABS: 9.2 10*3/uL — AB (ref 1.7–7.7)
NEUTROS PCT: 77 %
Platelets: 215 10*3/uL (ref 150–400)
RBC: 4.92 MIL/uL (ref 3.87–5.11)
RDW: 13.5 % (ref 11.5–15.5)
WBC: 11.9 10*3/uL — AB (ref 4.0–10.5)

## 2017-03-18 LAB — LIPASE, BLOOD: LIPASE: 33 U/L (ref 11–51)

## 2017-03-18 LAB — COMPREHENSIVE METABOLIC PANEL
ALK PHOS: 77 U/L (ref 38–126)
ALT: 24 U/L (ref 14–54)
AST: 23 U/L (ref 15–41)
Albumin: 3.5 g/dL (ref 3.5–5.0)
Anion gap: 10 (ref 5–15)
BUN: 27 mg/dL — AB (ref 6–20)
CALCIUM: 9.5 mg/dL (ref 8.9–10.3)
CO2: 21 mmol/L — AB (ref 22–32)
CREATININE: 1.23 mg/dL — AB (ref 0.44–1.00)
Chloride: 97 mmol/L — ABNORMAL LOW (ref 101–111)
GFR calc Af Amer: 45 mL/min — ABNORMAL LOW (ref >60)
GFR, EST NON AFRICAN AMERICAN: 39 mL/min — AB (ref >60)
Glucose, Bld: 234 mg/dL — ABNORMAL HIGH (ref 65–99)
Potassium: 4.5 mmol/L (ref 3.5–5.1)
Sodium: 128 mmol/L — ABNORMAL LOW (ref 135–145)
Total Bilirubin: 1.5 mg/dL — ABNORMAL HIGH (ref 0.3–1.2)
Total Protein: 6.8 g/dL (ref 6.5–8.1)

## 2017-03-18 MED ORDER — SODIUM CHLORIDE 0.9 % IV BOLUS (SEPSIS)
1000.0000 mL | Freq: Once | INTRAVENOUS | Status: AC
  Administered 2017-03-18: 1000 mL via INTRAVENOUS

## 2017-03-18 MED ORDER — ONDANSETRON 4 MG PO TBDP: 4.0000 mg | ORAL_TABLET | Freq: Three times a day (TID) | ORAL | 0 refills | Status: AC | PRN

## 2017-03-18 MED ORDER — SODIUM CHLORIDE 0.9 % IV BOLUS (SEPSIS): 500.0000 mL | Freq: Once | INTRAVENOUS | Status: DC

## 2017-03-18 MED ORDER — ONDANSETRON HCL 4 MG/2ML IJ SOLN
4.0000 mg | Freq: Once | INTRAMUSCULAR | Status: AC
  Administered 2017-03-18: 4 mg via INTRAVENOUS
  Filled 2017-03-18: qty 2

## 2017-03-18 NOTE — ED Provider Notes (Signed)
[facility] DEPT Provider Note   CSN: [id number] Arrival date & time: 03/18/17  1057     History   Chief Complaint Chief Complaint  Patient presents with  . Weakness    HPI [name] is a 83 y.o. female.  HPI 83 year old female who presents with 2 days of worsening fatigue. She reports that she's had nausea and vomiting since Friday and has had minimal by mouth intake and tolerance. Patient's primary care provider called in prescription for Phenergan which resulted in the patient hallucinating shortly after. This has subsequently resolved. No relieving or exacerbating factors to the patient's fatigue. Family feels like the patient is dehydrated given the decreased by mouth intake. Patient denies any recent fevers, infections, chest pain, shortness of breath, abdominal pain, diarrhea.  Patient and family do no history of right toe amputation due to peripheral vascular disease. This was assessed by the vascular surgeon yesterday and seemed to be doing well.  She is taking tramadol 50mg  QID for toe amputation performed 4/9th (healing well on assessment yesterday).   Past Medical History:  Diagnosis Date  . Anemia   . Arthritis   . Dysrhythmia    hx brief AF post op hip surg  . GERD (gastroesophageal reflux disease)   . Hypertension     Patient Active Problem List   Diagnosis Date Noted  . Long term (current) use of anticoagulants [Z79.01] 12/13/2015  . PAD (peripheral artery disease) ([place]) 10/05/2015  . ULCER OF OTHER PART OF FOOT 06/06/2010  . ESSENTIAL HYPERTENSION 03/26/2010  . ATRIAL FIBRILLATION 03/26/2010  . CUTANEOUS ERUPTIONS, DRUG-INDUCED 03/26/2010  . DIARRHEA 03/26/2010  . GERD 03/20/2010  . UNSPEC LOCAL INFECTION SKIN&SUBCUTANEOUS TISSUE 03/20/2010  . OSTEOARTHRITIS 03/20/2010  . ARTHRITIS, SEPTIC 03/14/2010  . OTH COMPLICATIONS DUE INTERNAL JOINT PROSTHESIS 02/22/2010    Past Surgical History:  Procedure Laterality Date  . AMPUTATION Right  02/04/2013   Procedure: [name] ;  Surgeon: Wylene Simmer, MD;  Location: [facility];  Service: Orthopedics;  Laterality: Right;  . APPENDECTOMY    . COLONOSCOPY    . EYE SURGERY     both cataracts  . INCISION AND DRAINAGE  2011   left hip inf-hemovac  . KNEE ARTHROSCOPY  1995   left  . NECK MASS EXCISION    . TONSILLECTOMY    . TOTAL HIP ARTHROPLASTY  2006   left-fx  . TOTAL KNEE ARTHROPLASTY  [id number]   left  . TOTAL KNEE ARTHROPLASTY  2009   right    OB History    No data available       Home Medications    Prior to Admission medications   Medication Sig Start Date End Date Taking? Authorizing Provider  acetaminophen (TYLENOL) 325 MG tablet Take 650 mg by mouth 2 (two) times daily as needed for mild pain.   Yes Historical Provider, MD  calcium carbonate (CALCIUM 500) 1250 MG tablet Take 1 tablet by mouth daily with breakfast.    Yes Historical Provider, MD  cholecalciferol (VITAMIN D) 1000 units tablet Take 1,000 Units by mouth daily.   Yes Historical Provider, MD  metoprolol succinate (TOPROL-XL) 25 MG 24 hr tablet Take 25 mg by mouth daily.   Yes Historical Provider, MD  traMADol (ULTRAM) 50 MG tablet Take 0.5 tablets (25 mg total) by mouth every 6 (six) hours as needed. 03/03/17  Yes [name], DPM  warfarin (COUMADIN) 2.5 MG tablet Take 1.25 mg by mouth daily at 6  PM.    Yes Historical Provider, MD  amoxicillin-clavulanate (AUGMENTIN) 875-125 MG tablet Take 1 tablet by mouth 2 (two) times daily. Patient not taking: Reported on 03/18/2017 02/24/17   [name], DPM  clindamycin (CLEOCIN) 150 MG capsule Take 1 capsule (150 mg total) by mouth every 6 (six) hours. Patient not taking: Reported on 02/26/2017 02/13/17   Junius Creamer, NP  HYDROcodone-acetaminophen (NORCO/VICODIN) 5-325 MG tablet Take 1 tablet by mouth every 6 (six) hours as needed for severe pain. Patient not taking: Reported on 02/26/2017 02/13/17   Junius Creamer, NP  ondansetron  (ZOFRAN ODT) 4 MG disintegrating tablet Take 1 tablet (4 mg total) by mouth every 8 (eight) hours as needed for nausea or vomiting. 03/18/17 03/23/17  Fatima Blank, MD  sulfamethoxazole-trimethoprim (BACTRIM DS) 800-160 MG tablet Take 1 tablet by mouth 2 (two) times daily. 03/03/17   [name], DPM    Family History Family History  Problem Relation Age of Onset  . Cancer Mother     Social History Social History  Substance Use Topics  . Smoking status: Never Smoker  . Smokeless tobacco: Never Used  . Alcohol use No     Allergies   Ceftriaxone sodium; Sulfonamide derivatives; and Vancomycin hcl   Review of Systems Review of Systems All other systems are reviewed and are negative for acute change except as noted in the HPI   Physical Exam Updated Vital Signs BP (!) 139/91 (BP Location: Right Arm)   Pulse 90   Temp 97.9 F (36.6 C) (Oral)   Resp (!) 22   Ht 5\' 3"  (1.6 m)   Wt 115 lb (52.2 kg)   SpO2 98%   BMI 20.37 kg/m   Physical Exam  Constitutional: She is oriented to person, place, and time. She appears well-developed and well-nourished. No distress.  HENT:  Head: Normocephalic and atraumatic.  Nose: Nose normal.  Mouth/Throat: Mucous membranes are dry.  Eyes: Conjunctivae and EOM are normal. Pupils are equal, round, and reactive to light. Right eye exhibits no discharge. Left eye exhibits no discharge. No scleral icterus.  Neck: Normal range of motion. Neck supple.  Cardiovascular: An irregularly irregular rhythm present. Tachycardia present.  Exam reveals no gallop and no friction rub.   No murmur heard. Pulmonary/Chest: Effort normal and breath sounds normal. No stridor. No respiratory distress. She has no rales.  Abdominal: Soft. She exhibits no distension. There is no tenderness.  Musculoskeletal: She exhibits no edema or tenderness.  Feet:  Left Foot:  Skin Integrity: Positive for ulcer (states to ulcer of the lateral aspect of the left small  pinking as well as the lateral malleolus.).  Neurological: She is alert and oriented to person, place, and time.  Skin: Skin is warm and dry. No rash noted. She is not diaphoretic. No erythema.  Psychiatric: She has a normal mood and affect.  Vitals reviewed.    ED Treatments / Results  Labs (all labs ordered are listed, but only abnormal results are displayed) Labs Reviewed  CBC WITH DIFFERENTIAL/PLATELET - Abnormal; Notable for the following:       Result Value   WBC 11.9 (*)    Hemoglobin 15.2 (*)    Neutro Abs 9.2 (*)    All other components within normal limits  COMPREHENSIVE METABOLIC PANEL - Abnormal; Notable for the following:    Sodium 128 (*)    Chloride 97 (*)    CO2 21 (*)    Glucose, Bld 234 (*)  BUN 27 (*)    Creatinine, Ser 1.23 (*)    Total Bilirubin 1.5 (*)    GFR calc non Af Amer 39 (*)    GFR calc Af Amer 45 (*)    All other components within normal limits  LIPASE, BLOOD    EKG  EKG Interpretation  Date/Time:  Tuesday Mar 18 2017 11:01:01 EDT Ventricular Rate:  103 PR Interval:    QRS Duration: 98 QT Interval:  349 QTC Calculation: 410 R Axis:   -67 Text Interpretation:  Atrial fibrillation Paired ventricular premature complexes LAD, consider left anterior fascicular block Abnormal R-wave progression, early transition LVH with secondary repolarization abnormality Otherwise no significant change Confirmed by [facility] MD, Orbin Mayeux ([id number]) on 03/18/2017 1:04:12 PM       Radiology No results found.  Procedures Procedures (including critical care time)  Medications Ordered in ED Medications  sodium chloride 0.9 % bolus 1,000 mL (0 mLs Intravenous Stopped 03/18/17 1333)  ondansetron (ZOFRAN) injection 4 mg (4 mg Intravenous Given 03/18/17 1213)  sodium chloride 0.9 % bolus 1,000 mL (0 mLs Intravenous Stopped 03/18/17 1625)     Initial Impression / Assessment and Plan / ED Course  I have reviewed the triage vital signs and the nursing  notes.  Pertinent labs & imaging results that were available during my care of the patient were reviewed by me and considered in my medical decision making (see chart for details).     Patient with clinical evidence of dehydration on exam. Screening labs obtained and revealed mild hyponatremia which I do not believe is contributing to her symptoms. Feel this is secondary to her recent lack of by mouth nutritional intake. Provided with symptomatic treatment with nausea medicine and IV fluids.   Presentation was not concerning for small bowel obstruction, diverticulitis, appendicitis, pancreatitis, biliary disease.  Reassessment patient has significant improvement in her symptomatology. She was able to tolerate by mouth fluid and food intake. She is requesting to be discharged home, and states she will follow up closely with a primary care provider. I feel that this is reasonable.  Safe for discharge with strict return precautions.  Final Clinical Impressions(s) / ED Diagnoses   Final diagnoses:  Dehydration  Non-intractable cyclical vomiting with nausea  Hyponatremia  Hyperglycemia   Disposition: Discharge  Condition: Good  I have discussed the results, Dx and Tx plan with the patient who expressed understanding and agree(s) with the plan. Discharge instructions discussed at great length. The patient was given strict return precautions who verbalized understanding of the instructions. No further questions at time of discharge.    Discharge Medication List as of 03/18/2017  4:08 PM    START taking these medications   Details  ondansetron (ZOFRAN ODT) 4 MG disintegrating tablet Take 1 tablet (4 mg total) by mouth every 8 (eight) hours as needed for nausea or vomiting., Starting Tue 03/18/2017, Until Sun 03/23/2017, Print        Follow Up: Leanna Battles, MD [place] [address] [phone]  In 3 days For close follow up      Fatima Blank,  MD 03/18/17 1723

## 2017-03-18 NOTE — ED Triage Notes (Addendum)
Pt brought in by EMS due to having generalized weakness and decreased appetite since Saturday. Pt has hx of a.fib and takes coumadin. Pt had 3 toe on left foot removed on April 9th. Pt a&ox4.

## 2017-03-18 NOTE — ED Notes (Signed)
Pt able to keep down liquids and half a sandwich.

## 2017-03-19 DIAGNOSIS — Z4781 Encounter for orthopedic aftercare following surgical amputation: Secondary | ICD-10-CM | POA: Diagnosis not present

## 2017-03-19 DIAGNOSIS — E11621 Type 2 diabetes mellitus with foot ulcer: Secondary | ICD-10-CM | POA: Diagnosis not present

## 2017-03-19 DIAGNOSIS — E1140 Type 2 diabetes mellitus with diabetic neuropathy, unspecified: Secondary | ICD-10-CM | POA: Diagnosis not present

## 2017-03-19 DIAGNOSIS — E1151 Type 2 diabetes mellitus with diabetic peripheral angiopathy without gangrene: Secondary | ICD-10-CM | POA: Diagnosis not present

## 2017-03-19 DIAGNOSIS — L97529 Non-pressure chronic ulcer of other part of left foot with unspecified severity: Secondary | ICD-10-CM | POA: Diagnosis not present

## 2017-03-19 DIAGNOSIS — L97329 Non-pressure chronic ulcer of left ankle with unspecified severity: Secondary | ICD-10-CM | POA: Diagnosis not present

## 2017-03-24 ENCOUNTER — Other Ambulatory Visit: Payer: Self-pay | Admitting: Radiology

## 2017-03-24 DIAGNOSIS — Z4781 Encounter for orthopedic aftercare following surgical amputation: Secondary | ICD-10-CM | POA: Diagnosis not present

## 2017-03-24 DIAGNOSIS — E1151 Type 2 diabetes mellitus with diabetic peripheral angiopathy without gangrene: Secondary | ICD-10-CM | POA: Diagnosis not present

## 2017-03-24 DIAGNOSIS — E1140 Type 2 diabetes mellitus with diabetic neuropathy, unspecified: Secondary | ICD-10-CM | POA: Diagnosis not present

## 2017-03-24 DIAGNOSIS — L97529 Non-pressure chronic ulcer of other part of left foot with unspecified severity: Secondary | ICD-10-CM | POA: Diagnosis not present

## 2017-03-24 DIAGNOSIS — L97329 Non-pressure chronic ulcer of left ankle with unspecified severity: Secondary | ICD-10-CM | POA: Diagnosis not present

## 2017-03-24 DIAGNOSIS — E11621 Type 2 diabetes mellitus with foot ulcer: Secondary | ICD-10-CM | POA: Diagnosis not present

## 2017-03-25 ENCOUNTER — Other Ambulatory Visit (HOSPITAL_COMMUNITY): Payer: Self-pay | Admitting: Interventional Radiology

## 2017-03-25 ENCOUNTER — Encounter (HOSPITAL_COMMUNITY): Payer: Self-pay

## 2017-03-25 ENCOUNTER — Ambulatory Visit (HOSPITAL_COMMUNITY)
Admission: RE | Admit: 2017-03-25 | Discharge: 2017-03-25 | Disposition: A | Discharge status: Home / Self Care | Payer: Medicare Other | Source: Ambulatory Visit | Attending: Interventional Radiology | Admitting: Interventional Radiology

## 2017-03-25 DIAGNOSIS — I10 Essential (primary) hypertension: Secondary | ICD-10-CM | POA: Diagnosis not present

## 2017-03-25 DIAGNOSIS — Z96652 Presence of left artificial knee joint: Secondary | ICD-10-CM | POA: Diagnosis not present

## 2017-03-25 DIAGNOSIS — I739 Peripheral vascular disease, unspecified: Secondary | ICD-10-CM

## 2017-03-25 DIAGNOSIS — I7092 Chronic total occlusion of artery of the extremities: Secondary | ICD-10-CM | POA: Insufficient documentation

## 2017-03-25 DIAGNOSIS — Z89422 Acquired absence of other left toe(s): Secondary | ICD-10-CM | POA: Diagnosis not present

## 2017-03-25 DIAGNOSIS — I700 Atherosclerosis of aorta: Secondary | ICD-10-CM | POA: Diagnosis not present

## 2017-03-25 DIAGNOSIS — Y835 Amputation of limb(s) as the cause of abnormal reaction of the patient, or of later complication, without mention of misadventure at the time of the procedure: Secondary | ICD-10-CM | POA: Insufficient documentation

## 2017-03-25 DIAGNOSIS — Z7901 Long term (current) use of anticoagulants: Secondary | ICD-10-CM | POA: Insufficient documentation

## 2017-03-25 DIAGNOSIS — T8789 Other complications of amputation stump: Secondary | ICD-10-CM | POA: Insufficient documentation

## 2017-03-25 DIAGNOSIS — I70222 Atherosclerosis of native arteries of extremities with rest pain, left leg: Secondary | ICD-10-CM | POA: Insufficient documentation

## 2017-03-25 DIAGNOSIS — K219 Gastro-esophageal reflux disease without esophagitis: Secondary | ICD-10-CM | POA: Insufficient documentation

## 2017-03-25 DIAGNOSIS — M1990 Unspecified osteoarthritis, unspecified site: Secondary | ICD-10-CM | POA: Insufficient documentation

## 2017-03-25 DIAGNOSIS — T8189XA Other complications of procedures, not elsewhere classified, initial encounter: Secondary | ICD-10-CM | POA: Insufficient documentation

## 2017-03-25 DIAGNOSIS — I771 Stricture of artery: Secondary | ICD-10-CM | POA: Diagnosis not present

## 2017-03-25 DIAGNOSIS — Z882 Allergy status to sulfonamides status: Secondary | ICD-10-CM | POA: Insufficient documentation

## 2017-03-25 HISTORY — PX: IR US GUIDE VASC ACCESS RIGHT: IMG2390

## 2017-03-25 HISTORY — PX: IR TIB-PERO ART PTA MOD SED: IMG2313

## 2017-03-25 HISTORY — PX: IR ANGIOGRAM SELECTIVE EACH ADDITIONAL VESSEL: IMG667

## 2017-03-25 HISTORY — PX: IR FEM POP ART STENT INC PTA MOD SED: IMG2311

## 2017-03-25 HISTORY — PX: IR ANGIOGRAM EXTREMITY LEFT: IMG651

## 2017-03-25 LAB — CBC
HCT: 45.7 % (ref 36.0–46.0)
Hemoglobin: 15.6 g/dL — ABNORMAL HIGH (ref 12.0–15.0)
MCH: 32.2 pg (ref 26.0–34.0)
MCHC: 34.1 g/dL (ref 30.0–36.0)
MCV: 94.4 fL (ref 78.0–100.0)
Platelets: 329 10*3/uL (ref 150–400)
RBC: 4.84 MIL/uL (ref 3.87–5.11)
RDW: 13 % (ref 11.5–15.5)
WBC: 12.7 10*3/uL — ABNORMAL HIGH (ref 4.0–10.5)

## 2017-03-25 LAB — POCT ACTIVATED CLOTTING TIME
ACTIVATED CLOTTING TIME: 246 s
ACTIVATED CLOTTING TIME: 285 s
Activated Clotting Time: 175 seconds

## 2017-03-25 LAB — BASIC METABOLIC PANEL WITH GFR
Anion gap: 10 (ref 5–15)
BUN: 21 mg/dL — ABNORMAL HIGH (ref 6–20)
CO2: 27 mmol/L (ref 22–32)
Calcium: 9.4 mg/dL (ref 8.9–10.3)
Chloride: 100 mmol/L — ABNORMAL LOW (ref 101–111)
Creatinine, Ser: 0.84 mg/dL (ref 0.44–1.00)
GFR calc Af Amer: >60 mL/min
GFR calc non Af Amer: >60 mL/min
Glucose, Bld: 214 mg/dL — ABNORMAL HIGH (ref 65–99)
Potassium: 4.3 mmol/L (ref 3.5–5.1)
Sodium: 137 mmol/L (ref 135–145)

## 2017-03-25 LAB — PROTIME-INR
INR: 1.16
PROTHROMBIN TIME: 14.8 s (ref 11.4–15.2)

## 2017-03-25 LAB — APTT: aPTT: 32 s (ref 24–36)

## 2017-03-25 MED ORDER — IOPAMIDOL (ISOVUE-300) INJECTION 61%
INTRAVENOUS | Status: AC
  Administered 2017-03-25: 100 mL
  Filled 2017-03-25: qty 150

## 2017-03-25 MED ORDER — NITROGLYCERIN 1 MG/10 ML FOR IR/CATH LAB
INTRA_ARTERIAL | Status: AC
  Filled 2017-03-25: qty 10

## 2017-03-25 MED ORDER — CLOPIDOGREL BISULFATE 75 MG PO TABS
ORAL_TABLET | ORAL | Status: AC
  Filled 2017-03-25: qty 4

## 2017-03-25 MED ORDER — LIDOCAINE HCL 1 % IJ SOLN
INTRAMUSCULAR | Status: AC
  Filled 2017-03-25: qty 20

## 2017-03-25 MED ORDER — FENTANYL CITRATE (PF) 100 MCG/2ML IJ SOLN
INTRAMUSCULAR | Status: AC
  Filled 2017-03-25: qty 6

## 2017-03-25 MED ORDER — ASPIRIN 325 MG PO TABS
325.0000 mg | ORAL_TABLET | Freq: Once | ORAL | Status: AC
  Administered 2017-03-25: 325 mg via ORAL

## 2017-03-25 MED ORDER — LIDOCAINE HCL 1 % IJ SOLN
INTRAMUSCULAR | Status: AC | PRN
  Administered 2017-03-25: 10 mL

## 2017-03-25 MED ORDER — MIDAZOLAM HCL 2 MG/2ML IJ SOLN
INTRAMUSCULAR | Status: AC
  Filled 2017-03-25: qty 6

## 2017-03-25 MED ORDER — HYDRALAZINE HCL 20 MG/ML IJ SOLN
INTRAMUSCULAR | Status: AC | PRN
  Administered 2017-03-25: 5 mg via INTRAVENOUS

## 2017-03-25 MED ORDER — HYDRALAZINE HCL 20 MG/ML IJ SOLN
INTRAMUSCULAR | Status: AC
  Filled 2017-03-25: qty 1

## 2017-03-25 MED ORDER — HEPARIN SODIUM (PORCINE) 1000 UNIT/ML IJ SOLN
INTRAMUSCULAR | Status: AC | PRN
  Administered 2017-03-25: 8000 [IU] via INTRAVENOUS

## 2017-03-25 MED ORDER — HEPARIN SODIUM (PORCINE) 1000 UNIT/ML IJ SOLN
INTRAMUSCULAR | Status: AC
  Filled 2017-03-25: qty 1

## 2017-03-25 MED ORDER — SODIUM CHLORIDE 0.9 % IV SOLN: INTRAVENOUS | Status: AC

## 2017-03-25 MED ORDER — CLOPIDOGREL BISULFATE 75 MG PO TABS
300.0000 mg | ORAL_TABLET | Freq: Once | ORAL | Status: AC
  Administered 2017-03-25: 300 mg via ORAL

## 2017-03-25 MED ORDER — IOPAMIDOL (ISOVUE-300) INJECTION 61%
INTRAVENOUS | Status: AC
Start: 2017-03-25 — End: 2017-03-25
  Administered 2017-03-25: 100 mL
  Filled 2017-03-25: qty 150

## 2017-03-25 MED ORDER — FENTANYL CITRATE (PF) 100 MCG/2ML IJ SOLN
INTRAMUSCULAR | Status: AC | PRN
  Administered 2017-03-25 (×3): 25 ug via INTRAVENOUS

## 2017-03-25 MED ORDER — SODIUM CHLORIDE 0.9 % IV SOLN: INTRAVENOUS | Status: DC

## 2017-03-25 MED ORDER — SODIUM CHLORIDE 0.9 % IJ SOLN
INTRAVENOUS | Status: AC | PRN
  Administered 2017-03-25: 200 ug via INTRA_ARTERIAL

## 2017-03-25 MED ORDER — ASPIRIN 325 MG PO TABS
ORAL_TABLET | ORAL | Status: AC
  Filled 2017-03-25: qty 1

## 2017-03-25 MED ORDER — MIDAZOLAM HCL 2 MG/2ML IJ SOLN
INTRAMUSCULAR | Status: AC | PRN
  Administered 2017-03-25: 1 mg via INTRAVENOUS
  Administered 2017-03-25 (×2): 0.5 mg via INTRAVENOUS

## 2017-03-25 NOTE — Sedation Documentation (Signed)
Patient denies pain and is resting comfortably.  

## 2017-03-25 NOTE — Sedation Documentation (Signed)
Patient is resting comfortably. No complaints from pt at this time.  

## 2017-03-25 NOTE — Sedation Documentation (Signed)
Patient is resting comfortably. 

## 2017-03-25 NOTE — Sedation Documentation (Signed)
Vital signs stable. 

## 2017-03-25 NOTE — H&P (Signed)
Chief Complaint: Patient was seen in consultation today for Bilateral lower extremity runoff; possible angioplasty/stent Left low leg at the request of Dr Daylene Katayama  Referring Physician(s): Dr Marilynn Rail  Supervising Physician: Corrie Mckusick  Patient Status: [facility] - Out-pt  History of Present Illness: [name] is a 83 y.o. female   DR Earleen Newport note 02/26/2017:  Assessment:  [name] is an 83 yo female presenting with history of osteomyelitis of left third toe, SP amputation on 02/18/2017.  She has additional history of several wounds of the left and right foot, compatible with Rutherford 5 class symptoms of CLI.   In addition to the wounds, she has complaints of rest pain.   Non-invasive lower extremity exam shows evidence of bilateral femoral-popliteal disease, as well as bilateral likely tibial disease.  Although the the left ABI is in the mild range, this may be falsely elevated.   I had a discussion with [name] and her family regarding anatomy, pathology/pathophysiology, natural history, and prognosis of critical limb ischemia.  Informed consent regarding treatment strategies was performed in which I discussed possibly medical treatment and expectant therapy, versus endovascular options versus open surgical options. I gave her my impression that given what we know about critical limb ischemia, she may be at risk for further tissue loss/amputation, and that there is clear evidence supporting intervention for revascularization, with indications for treatment supported by updated guidelines1, 2. I performed a complete informed consent regarding endovascular options, specific risks discussed include: bleeding, infection, contrast reaction, renal injury/nephropathy, arterial injury/dissection, need for additional procedure/surgery, worsening symptoms/tissue including limb loss, cardiopulmonary collapse, death.   I also discussed medical management, maximal medical therapy for reduction of  risk factors is indicated as recommended by updated AHA guidelines1.  For her this would include anti-platelet medication, tight blood pressure control, and considering maximum-dose HMG-CoA reductase inhibitor.    Now scheduled for procedure/intervention in IR with Dr Earleen Newport LD coumadin 5/3 per pt  Past Medical History:  Diagnosis Date  . Anemia   . Arthritis   . Dysrhythmia    hx brief AF post op hip surg  . GERD (gastroesophageal reflux disease)   . Hypertension     Past Surgical History:  Procedure Laterality Date  . AMPUTATION Right 02/04/2013   Procedure: [name] ;  Surgeon: Wylene Simmer, MD;  Location: [place];  Service: Orthopedics;  Laterality: Right;  . APPENDECTOMY    . COLONOSCOPY    . EYE SURGERY     both cataracts  . INCISION AND DRAINAGE  2011   left hip inf-hemovac  . KNEE ARTHROSCOPY  1995   left  . NECK MASS EXCISION    . TONSILLECTOMY    . TOTAL HIP ARTHROPLASTY  2006   left-fx  . TOTAL KNEE ARTHROPLASTY  [id number]   left  . TOTAL KNEE ARTHROPLASTY  2009   right    Allergies: Ceftriaxone sodium; Sulfonamide derivatives; and Vancomycin hcl  Medications: Prior to Admission medications   Medication Sig Start Date End Date Taking? Authorizing Provider  acetaminophen (TYLENOL) 325 MG tablet Take 650 mg by mouth 2 (two) times daily as needed for mild pain.   Yes [provider]  calcium carbonate (CALCIUM 500) 1250 MG tablet Take 1 tablet by mouth daily with breakfast.    Yes [provider]  cholecalciferol (VITAMIN D) 1000 units tablet Take 1,000 Units by mouth daily.   Yes [provider]  metoprolol succinate (TOPROL-XL)  25 MG 24 hr tablet Take 25 mg by mouth daily.   Yes [provider]  warfarin (COUMADIN) 2.5 MG tablet Take 1.25 mg by mouth daily at 6 PM.    Yes [provider]     Family History  Problem Relation Age of Onset  . Cancer Mother     Social History    Social History  . Marital status: Married    Spouse name: N/A  . Number of children: N/A  . Years of education: N/A   Social History Main Topics  . Smoking status: Never Smoker  . Smokeless tobacco: Never Used  . Alcohol use No  . Drug use: No  . Sexual activity: Not Asked   Other Topics Concern  . None   Social History Narrative  . None    Review of Systems: A 12 point ROS discussed and pertinent positives are indicated in the HPI above.  All other systems are negative.  Review of Systems  Constitutional: Positive for activity change and fatigue.  Respiratory: Negative for shortness of breath.   Cardiovascular: Negative for chest pain.  Gastrointestinal: Negative for abdominal pain.  Musculoskeletal: Positive for gait problem.  Psychiatric/Behavioral: Negative for behavioral problems and confusion.    Vital Signs: BP (!) 175/99   Pulse 78   Temp 97.8 F (36.6 C) (Oral)   Resp 16   Ht 5\' 2"  (1.575 m)   Wt 110 lb (49.9 kg)   SpO2 97%   BMI 20.12 kg/m   Physical Exam  Constitutional: She is oriented to person, place, and time.  Cardiovascular:  No murmur heard. Irregular  Pulmonary/Chest: Effort normal and breath sounds normal. She has no wheezes.  Abdominal: Soft. Bowel sounds are normal. There is no tenderness.  Musculoskeletal: Normal range of motion. She exhibits tenderness.  Left foot wrapped in ace bandage Left toe #3 amputation 02/24/2017  Right foot toe#5 amputation 2013  Neurological: She is alert and oriented to person, place, and time.  Skin: Skin is warm and dry.  Psychiatric: She has a normal mood and affect. Her behavior is normal. Judgment and thought content normal.  Nursing note and vitals reviewed.   Mallampati Score:  MD Evaluation Airway: WNL Heart: WNL Abdomen: WNL Chest/ Lungs: WNL ASA  Classification: 3 Mallampati/Airway Score: One  Imaging: Ct Angio Ao+bifem W & Or Wo Contrast  Result Date: 03/05/2017 CLINICAL DATA:   83 year old with history of left third toe amputation for osteomyelitis. Bilateral foot wounds. Evaluate peripheral vascular disease. EXAM: CT ANGIOGRAPHY OF ABDOMINAL AORTA WITH ILIOFEMORAL RUNOFF TECHNIQUE: Multidetector CT imaging of the abdomen, pelvis and lower extremities was performed using the standard protocol during bolus administration of intravenous contrast. Multiplanar CT image reconstructions and MIPs were obtained to evaluate the vascular anatomy. CONTRAST:  100 mL Isovue 370 COMPARISON:  Noninvasive vascular study of lower extremities 02/25/2017 FINDINGS: VASCULAR Aorta: Extensive atherosclerotic disease in the distal descending thoracic aorta. The abdominal aorta at the hiatus measures 2.7 cm. Extensive atherosclerotic disease of the abdominal aorta without aneurysm. Celiac: Variant celiac anatomy. There is not a celiac artery trunk. Splenic artery originates directly from the abdominal aorta with diffuse atherosclerotic disease. No splenic artery aneurysm. Left gastric artery probably originates directly from the abdominal aorta but there is minimal flow near the origin and the origin may be occluded. Common hepatic artery originates from the SMA. SMA: High-grade, critical stenosis at the origin of the SMA. The first branch of the SMA is a replaced common hepatic artery.  Main branches of the SMA are patent. Renals: Mixed plaque at the origin of the right renal artery with at least moderate stenosis stenosis in right renal artery. Calcified plaque at origin of the left renal artery with mild stenosis. IMA: IMA is patent. RIGHT Lower Extremity Inflow: Mild narrowing of the proximal right common iliac artery due to circumferential plaque. Right common iliac artery is patent. Atherosclerotic plaque at the origin of the right internal iliac artery with diffuse atherosclerotic disease in the right internal iliac artery. Right external iliac artery is patent without significant plaque or stenosis.  Overall, there is not significant inflow disease on the right side. Outflow: Mild atherosclerotic disease in the right common femoral artery without significant stenosis. Right profunda femoral artery branches are patent. Diffuse multifocal disease throughout the right SFA. The right SFA is patent. Concern for a high-grade stenosis at the adductor canal and the proximal right popliteal artery, best seen on sequence 5, image 225. Right popliteal artery appears to be diffusely diseased but limited evaluation due to artifact from the knee replacement. Runoff: Posterior tibial artery is occluded. Evidence for 2 vessel runoff from the peroneal artery and anterior tibial artery. Dorsalis pedis artery is patent. There may be minor reconstitution of the posterior tibial artery. LEFT Lower Extremity Inflow: Mild narrowing in the left common iliac artery. Atherosclerotic disease in the left internal iliac artery. Left external iliac artery is patent without significant plaque or stenosis. No significant left inflow disease. Outflow: Atherosclerotic calcifications in left common femoral artery without significant stenosis. Left profunda femoral artery is patent. Left SFA has diffuse disease but patent. Multifocal areas of at least mild narrowing in the left SFA. Concern for focal moderate to severe narrowing at the left adductor canal. Left popliteal artery appears to be diffusely diseased and there is stenosis in the proximal aspect. Limited evaluation of the mid and distal popliteal artery due to the left knee replacement. Runoff: Limited evaluation of the runoff vessels due to calcifications. There appears to be flow in the anterior tibial artery and dorsalis pedis artery. There is probably flow in the peroneal artery. Suspect segmental areas of occlusion in the posterior tibial artery. There is some reconstitution the posterior tibial artery at the ankle. Veins: No obvious venous abnormality within the limitations of this  arterial phase study. Review of the MIP images confirms the above findings. NON-VASCULAR Lower chest: Heart is enlarged, particularly the right atrium. Left atrium is also prominent measuring 4.3 cm in the AP dimension. No large pleural effusions. Contrast refluxes into the hepatic veins suggesting increased right heart pressures. Lung bases are clear. No large pleural effusions. Hepatobiliary: Multiple calcified gallstones. No significant gallbladder distension or surrounding inflammation. No acute abnormality to the liver. Pancreas: Normal appearance of the pancreas without inflammation or duct dilatation. Spleen: Normal appearance of spleen without enlargement. Adrenals/Urinary Tract: Normal adrenal glands. Normal appearance of both kidneys without hydronephrosis. Stomach/Bowel: Small hiatal hernia. Colonic diverticulosis without acute bowel inflammation. No evidence for bowel obstruction. Lymphatic: No significant lymph node enlargement in the abdomen or pelvis. Reproductive: Uterus is poorly characterized on this examination. There is a large low-density structure along the left pelvic sidewall possibly involving the left adnexa. This measures 5.7 x 3.7 cm. This corresponds with the previously identified cystic structure on ultrasound from 05/06/2014. Previously, this structure measured up to 9.1 cm. Suspect this represents a benign cystic structure although incompletely characterized. Right adnexa is not well demonstrated on this examination. Other: No free fluid. Musculoskeletal: Left  third toe amputation. Old right posterior rib fractures. Osteopenia in the sacrum. Sclerosis involving the right humeral head probably represents osteonecrosis along with degenerative changes in the right hip. There is a left hip replacement. Bilateral knee arthroplasties. IMPRESSION: VASCULAR Diffuse atherosclerotic disease throughout the abdomen and pelvis. No significant inflow disease. Bilateral outflow disease demonstrated  by diffuse SFA disease bilaterally and focal narrowing at the adductor canals. Limited evaluation of the bilateral popliteal arteries due to the knee replacements. Bilateral runoff disease. Primary runoff vessel appears to be the anterior tibial artery bilaterally. Critical stenosis at the origin of the SMA. There is a replaced common hepatic artery as described. Right renal artery stenosis. Evidence for increased right heart pressures. NON-VASCULAR Cholelithiasis. Low-density left adnexal structure probably represents a cystic structure. Similar finding on ultrasound from 2015. This is likely benign based on the stability. Possible osteonecrosis in the right femoral head. Electronically Signed   By: Markus Daft M.D.   On: 03/05/2017 11:03   US Arterial Seg Multiple  Result Date: 02/25/2017 CLINICAL DATA:  83 year old female with a history of vascular disease and prior amputation of right fifth digit 2013, left third digit 02/24/2017. Cardiovascular risk factors include hypertension. EXAM: NONINVASIVE PHYSIOLOGIC VASCULAR STUDY OF BILATERAL LOWER EXTREMITIES TECHNIQUE: Evaluation of both lower extremities was performed at rest, including calculation of ankle-brachial indices, multiple segmental pressure evaluation, segmental Doppler and segmental pulse volume recording. COMPARISON:  None. FINDINGS: Right: Resting ankle brachial index:  Noncompressible vessels. Segmental blood pressure: Segmental pressures of the upper extremity are symmetric. Majority of the right lower extremity noncompressible. Right great toe measures 91 systolic Doppler: Segmental Doppler of the right lower extremity demonstrates triphasic common femoral artery and popliteal artery. Monophasic tibial waveforms of posterior tibial artery and dorsalis pedis. Pulse volume recording: Segmental PVR of the right lower extremity demonstrates waveform amplitude and quality maintained in the high thigh, with loss of augmentation. Further deterioration  at the ankle and digital segment. Left: Resting ankle brachial index: 0.78 Segmental blood pressure: Symmetric upper extremity pressures. Forty point systolic drop from high thigh to low thigh, with further drop of the segmental pressure at the calf. Ankle pressure measures 259 systolic. Left great toe pressure undetectable, with no history of left great toe amputation. Doppler: Segmental Doppler of the left lower extremity demonstrates triphasic femoral artery. Monophasic popliteal artery, posterior tibial artery, and dorsalis pedis. Pulse volume recording: Segmental PVR of the left lower extremity demonstrates waveform quality and amplitude maintained in the high thigh with loss of augmentation and further deterioration at the ankle. Flat line at the left great toe. Additional: IMPRESSION: Right: Resting ankle-brachial index is non calculable given the noncompressible vessels. Segmental exam demonstrates evidence of nonocclusive femoral popliteal disease, as well as developing tibial disease. Systolic pressure of the right great toe would confer positive healing capability, however, may be falsely elevated. Left: Resting ankle-brachial index in the mild range arterial occlusive disease, though would likely decrease after exercise exam. Segmental exam demonstrates femoral popliteal disease, with likely high-grade stenosis or occlusion of femoral popliteal lesion, as well as developing tibial and small vessel disease. Flat line waveform PPG of the left great toe and unobtainable systolic pressure of the great toe confers poor healing capability. Signed, Dulcy Fanny. Earleen Newport, DO [facility] Electronically Signed   By: Corrie Mckusick D.O.   On: 02/25/2017 14:58   Dg Foot Complete Left  Result Date: 03/03/2017 Please see detailed radiograph report in office note.  Labs:  CBC:  Recent Labs  02/13/17 2224 02/13/17 2236 03/18/17 1138 03/25/17 0645  WBC  8.8  --  11.9* 12.7*  HGB 16.3* 16.3* 15.2* 15.6*  HCT 48.5* 48.0* 45.3 45.7  PLT 170  --  215 329    COAGS:  Recent Labs  01/01/17 02/04/17 0935 03/25/17 0645  INR 1.2* 1.1 1.16  APTT  --   --  32    BMP:  Recent Labs  02/13/17 2236 03/18/17 1138 03/25/17 0645  NA 141 128* 137  K 3.7 4.5 4.3  CL 101 97* 100*  CO2  --  21* 27  GLUCOSE 296* 234* 214*  BUN 24* 27* 21*  CALCIUM  --  9.5 9.4  CREATININE 0.80 1.23* 0.84  GFRNONAA  --  39* >60  GFRAA  --  45* >60    LIVER FUNCTION TESTS:  Recent Labs  03/18/17 1138  BILITOT 1.5*  AST 23  ALT 24  ALKPHOS 77  PROT 6.8  ALBUMIN 3.5    TUMOR MARKERS: No results for input(s): AFPTM, CEA, CA199, CHROMGRNA in the last 8760 hours.  Assessment and Plan:  Known PVD Rt toe #5 amputation 2013 New Lt toe #3 amputation 02/26/2017 Slow healing wounds of left low leg Scheduled for bilateral lower extremity runoff with possible angioplasty/stent placement Risks and Benefits discussed with the patient including, but not limited to bleeding, infection, vascular injury or contrast induced renal failure. All of the patient's questions were answered, patient is agreeable to proceed. Consent signed and in chart.  Thank you for this interesting consult.  I greatly enjoyed meeting JOSHLYN BEADLE and look forward to participating in their care.  A copy of this report was sent to the requesting provider on this date.  Electronically Signed: Amberrose Friebel A 03/25/2017, 7:36 AM   I spent a total of  30 Minutes   in face to face in clinical consultation, greater than 50% of which was counseling/coordinating care for B low extremity runoff and intervention

## 2017-03-25 NOTE — Discharge Instructions (Signed)
Femoral Site Care °Refer to this sheet in the next few weeks. These instructions provide you with information about caring for yourself after your procedure. Your health care provider may also give you more specific instructions. Your treatment has been planned according to current medical practices, but problems sometimes occur. Call your health care provider if you have any problems or questions after your procedure. °What can I expect after the procedure? °After your procedure, it is typical to have the following: °· Bruising at the site that usually fades within 1-2 weeks. °· Blood collecting in the tissue (hematoma) that may be painful to the touch. It should usually decrease in size and tenderness within 1-2 weeks. °Follow these instructions at home: °· Take medicines only as directed by your health care provider. °· You may shower 24-48 hours after the procedure or as directed by your health care provider. Remove the bandage (dressing) and gently wash the site with plain soap and water. Pat the area dry with a clean towel. Do not rub the site, because this may cause bleeding. °· Do not take baths, swim, or use a hot tub until your health care provider approves. °· Check your insertion site every day for redness, swelling, or drainage. °· Do not apply powder or lotion to the site. °· Limit use of stairs to twice a day for the first 2-3 days or as directed by your health care provider. °· Do not squat for the first 2-3 days or as directed by your health care provider. °· Do not lift over 10 lb (4.5 kg) for 5 days after your procedure or as directed by your health care provider. °· Ask your health care provider when it is okay to: °¨ Return to work or school. °¨ Resume usual physical activities or sports. °¨ Resume sexual activity. °· Do not drive home if you are discharged the same day as the procedure. Have someone else drive you. °· You may drive 24 hours after the procedure unless otherwise instructed by  your health care provider. °· Do not operate machinery or power tools for 24 hours after the procedure or as directed by your health care provider. °· If your procedure was done as an outpatient procedure, which means that you went home the same day as your procedure, a responsible adult should be with you for the first 24 hours after you arrive home. °· Keep all follow-up visits as directed by your health care provider. This is important. °Contact a health care provider if: °· You have a fever. °· You have chills. °· You have increased bleeding from the site. Hold pressure on the site. °Get help right away if: °· You have unusual pain at the site. °· You have redness, warmth, or swelling at the site. °· You have drainage (other than a small amount of blood on the dressing) from the site. °· The site is bleeding, and the bleeding does not stop after 30 minutes of holding steady pressure on the site. °· Your leg or foot becomes pale, cool, tingly, or numb. °This information is not intended to replace advice given to you by your health care provider. Make sure you discuss any questions you have with your health care provider. °Document Released: 07/08/2014 Document Revised: 04/11/2016 Document Reviewed: 05/24/2014 °Elsevier Interactive Patient Education © 2017 [employer] ° °

## 2017-03-25 NOTE — Procedures (Signed)
Interventional Radiology Procedure Note  Procedure:  US guided right CFA access.  Angiogram LLE.    Revascularization of occluded popliteal artery, and treatment of distal SFA disease, with overlapping Supera stents from the below knee pop extending to the SFA.  4.78mm distal, 5.0 mm proximal.  Post angioplasty to 35mm.    Revascularization of occluded TP trunk and PT artery, with restoration of in-line flow to the ankle via the PT.  Marland Kitchen  Complications: None  Recommendations:  - Observe in VIR with right CFA sheath to allow metabolism of heparin.  Current ACT 285.  - Check ACT in 1 hour - Supine position with right hip straight. - continue PO ASA 325 daily - Patient will be restarting anti-coagulation tomorrow. - VIR to manage the sheath in post-op - Do not submerge for 7 days - Routine wound care - anticipate DC today.   Signed,  Dulcy Fanny. Earleen Newport, DO

## 2017-03-25 NOTE — Sedation Documentation (Signed)
Will transfer pt to radiology nurses station and continue to monitor.

## 2017-03-28 DIAGNOSIS — Z4781 Encounter for orthopedic aftercare following surgical amputation: Secondary | ICD-10-CM | POA: Diagnosis not present

## 2017-03-28 DIAGNOSIS — E1151 Type 2 diabetes mellitus with diabetic peripheral angiopathy without gangrene: Secondary | ICD-10-CM | POA: Diagnosis not present

## 2017-03-28 DIAGNOSIS — L97329 Non-pressure chronic ulcer of left ankle with unspecified severity: Secondary | ICD-10-CM | POA: Diagnosis not present

## 2017-03-28 DIAGNOSIS — L97529 Non-pressure chronic ulcer of other part of left foot with unspecified severity: Secondary | ICD-10-CM | POA: Diagnosis not present

## 2017-03-28 DIAGNOSIS — E11621 Type 2 diabetes mellitus with foot ulcer: Secondary | ICD-10-CM | POA: Diagnosis not present

## 2017-03-28 DIAGNOSIS — E1140 Type 2 diabetes mellitus with diabetic neuropathy, unspecified: Secondary | ICD-10-CM | POA: Diagnosis not present

## 2017-03-31 ENCOUNTER — Other Ambulatory Visit: Payer: Self-pay | Admitting: Interventional Radiology

## 2017-03-31 DIAGNOSIS — I739 Peripheral vascular disease, unspecified: Secondary | ICD-10-CM

## 2017-03-31 DIAGNOSIS — E1151 Type 2 diabetes mellitus with diabetic peripheral angiopathy without gangrene: Secondary | ICD-10-CM | POA: Diagnosis not present

## 2017-03-31 DIAGNOSIS — L97529 Non-pressure chronic ulcer of other part of left foot with unspecified severity: Secondary | ICD-10-CM | POA: Diagnosis not present

## 2017-03-31 DIAGNOSIS — Z4781 Encounter for orthopedic aftercare following surgical amputation: Secondary | ICD-10-CM | POA: Diagnosis not present

## 2017-03-31 DIAGNOSIS — E11621 Type 2 diabetes mellitus with foot ulcer: Secondary | ICD-10-CM | POA: Diagnosis not present

## 2017-03-31 DIAGNOSIS — L97329 Non-pressure chronic ulcer of left ankle with unspecified severity: Secondary | ICD-10-CM | POA: Diagnosis not present

## 2017-03-31 DIAGNOSIS — E1140 Type 2 diabetes mellitus with diabetic neuropathy, unspecified: Secondary | ICD-10-CM | POA: Diagnosis not present

## 2017-04-01 DIAGNOSIS — Z4781 Encounter for orthopedic aftercare following surgical amputation: Secondary | ICD-10-CM | POA: Diagnosis not present

## 2017-04-01 DIAGNOSIS — E1151 Type 2 diabetes mellitus with diabetic peripheral angiopathy without gangrene: Secondary | ICD-10-CM | POA: Diagnosis not present

## 2017-04-01 DIAGNOSIS — E1140 Type 2 diabetes mellitus with diabetic neuropathy, unspecified: Secondary | ICD-10-CM | POA: Diagnosis not present

## 2017-04-01 DIAGNOSIS — E11621 Type 2 diabetes mellitus with foot ulcer: Secondary | ICD-10-CM | POA: Diagnosis not present

## 2017-04-01 DIAGNOSIS — L97329 Non-pressure chronic ulcer of left ankle with unspecified severity: Secondary | ICD-10-CM | POA: Diagnosis not present

## 2017-04-01 DIAGNOSIS — L97529 Non-pressure chronic ulcer of other part of left foot with unspecified severity: Secondary | ICD-10-CM | POA: Diagnosis not present

## 2017-04-02 DIAGNOSIS — B078 Other viral warts: Secondary | ICD-10-CM | POA: Diagnosis not present

## 2017-04-02 DIAGNOSIS — D235 Other benign neoplasm of skin of trunk: Secondary | ICD-10-CM | POA: Diagnosis not present

## 2017-04-03 ENCOUNTER — Other Ambulatory Visit: Payer: Self-pay | Admitting: Interventional Radiology

## 2017-04-03 ENCOUNTER — Telehealth: Payer: Self-pay | Admitting: *Deleted

## 2017-04-03 ENCOUNTER — Ambulatory Visit
Admission: RE | Admit: 2017-04-03 | Discharge: 2017-04-03 | Disposition: A | Discharge status: Home / Self Care | Payer: Medicare Other | Source: Ambulatory Visit | Attending: Interventional Radiology | Admitting: Interventional Radiology

## 2017-04-03 DIAGNOSIS — L97529 Non-pressure chronic ulcer of other part of left foot with unspecified severity: Secondary | ICD-10-CM | POA: Diagnosis not present

## 2017-04-03 DIAGNOSIS — I739 Peripheral vascular disease, unspecified: Secondary | ICD-10-CM | POA: Diagnosis not present

## 2017-04-03 DIAGNOSIS — E1151 Type 2 diabetes mellitus with diabetic peripheral angiopathy without gangrene: Secondary | ICD-10-CM | POA: Diagnosis not present

## 2017-04-03 DIAGNOSIS — Z4781 Encounter for orthopedic aftercare following surgical amputation: Secondary | ICD-10-CM | POA: Diagnosis not present

## 2017-04-03 DIAGNOSIS — L97329 Non-pressure chronic ulcer of left ankle with unspecified severity: Secondary | ICD-10-CM | POA: Diagnosis not present

## 2017-04-03 DIAGNOSIS — E1140 Type 2 diabetes mellitus with diabetic neuropathy, unspecified: Secondary | ICD-10-CM | POA: Diagnosis not present

## 2017-04-03 DIAGNOSIS — E11621 Type 2 diabetes mellitus with foot ulcer: Secondary | ICD-10-CM | POA: Diagnosis not present

## 2017-04-03 HISTORY — PX: IR RADIOLOGIST EVAL & MGMT: IMG5224

## 2017-04-03 NOTE — Progress Notes (Signed)
Patient ID: [name], female   DOB: 1928/02/26, 83 y.o.   MRN: [id number]       Chief Complaint: Patient was seen in consultation today for possible stent occlusion at the request of [place]  Referring Physician(s): Wagner,Jaime  History of Present Illness: [name] is a 83 y.o. female known to our service from recent left lower extremity arterial revascularization of chronic total occlusion of popliteal artery, distal tibioperoneal trunk, and posterior tibial artery origin with restoration of in-line flow to the ankle 03/25/2017. Her wound care nurse today noted a change in appearance of the foot. Patient describes usual level of the pain. Her sensation is intact and she can move her toes. There is no redness or erythema. The right foot is slightly cool compared to the left. I cannot Doppler distal posterior tibial pulse however. She states that she has faithfully taken her prescribed  Coumadin and aspirin by mouth.  Past Medical History:  Diagnosis Date  . Anemia   . Arthritis   . Dysrhythmia    hx brief AF post op hip surg  . GERD (gastroesophageal reflux disease)   . Hypertension     Past Surgical History:  Procedure Laterality Date  . AMPUTATION Right 02/04/2013   Procedure: [name] ;  Surgeon: Wylene Simmer, MD;  Location: [facility];  Service: Orthopedics;  Laterality: Right;  . APPENDECTOMY    . COLONOSCOPY    . EYE SURGERY     both cataracts  . INCISION AND DRAINAGE  2011   left hip inf-hemovac  . IR ANGIOGRAM EXTREMITY LEFT  03/25/2017  . IR ANGIOGRAM SELECTIVE EACH ADDITIONAL VESSEL  03/25/2017  . IR FEM POP ART STENT INC PTA MOD SED  03/25/2017  . IR TIB-PERO ART PTA MOD SED  03/25/2017  . IR US GUIDE VASC ACCESS RIGHT  03/25/2017  . KNEE ARTHROSCOPY  1995   left  . NECK MASS EXCISION    . TONSILLECTOMY    . TOTAL HIP ARTHROPLASTY  2006   left-fx  . TOTAL KNEE ARTHROPLASTY  [id number],6761   left  . TOTAL KNEE ARTHROPLASTY  2009   right    Allergies: Ceftriaxone sodium; Sulfonamide derivatives; and Vancomycin hcl  Medications: Prior to Admission medications   Medication Sig Start Date End Date Taking? Authorizing Provider  acetaminophen (TYLENOL) 325 MG tablet Take 650 mg by mouth 2 (two) times daily as needed for mild pain.    [provider]  calcium carbonate (CALCIUM 500) 1250 MG tablet Take 1 tablet by mouth daily with breakfast.     [provider]  cholecalciferol (VITAMIN D) 1000 units tablet Take 1,000 Units by mouth daily.    [provider]  metoprolol succinate (TOPROL-XL) 25 MG 24 hr tablet Take 25 mg by mouth daily.    [provider]  warfarin (COUMADIN) 2.5 MG tablet Take 1.25 mg by mouth daily at 6 PM.     [provider]     Family History  Problem Relation Age of Onset  . Cancer Mother     Social History   Social History  . Marital status: Married    Spouse name: N/A  . Number of children: N/A  . Years of education: N/A   Social History Main Topics  . Smoking status: Never Smoker  . Smokeless tobacco: Never Used  . Alcohol use No  . Drug use: No  . Sexual activity: Not on file   Other Topics  Concern  . Not on file   Social History Narrative  . No narrative on file    ECOG Status: 1 - Symptomatic but completely ambulatory  Review of Systems: A 12 point ROS discussed and pertinent positives are indicated in the HPI above.  All other systems are negative.  Review of Systems  Vital Signs: There were no vitals taken for this visit.  Physical Exam  Mallampati Score:     Imaging: Ir Angiogram Extremity Left  Result Date: 03/25/2017 INDICATION: 83 year old female with a history of left lower extremity critical limb ischemia and a nonhealing amputation site. Prior ankle brachial index measures in the mild range of arterial occlusive disease, with the segmental exam demonstrating evidence of a distal femoral or popliteal  occlusion. CT performed 03/04/2017 confirms popliteal occlusion with questionable patency of the tibial vessels. She presents today for an angiogram and attempted treatment of the popliteal occlusion. EXAM: ULTRASOUND GUIDED ACCESS OF RIGHT COMMON FEMORAL ARTERY ANGIOGRAM LEFT LOWER EXTREMITY REVASCULARIZATION OF OCCLUDED POPLITEAL ARTERY WITH ANGIOPLASTY AND STENTING. REVASCULARIZATION OF OCCLUDED TIBIOPERONEAL ARTERY AND POSTERIOR TIBIAL ARTERY WITH BALLOON ANGIOPLASTY AND RESTORATION OF IN-LINE FLOW TO THE ANKLE MEDICATIONS: 200 MCG NITROGLYCERIN INTRA-ARTERIAL 8000 UNITS HEPARIN FOR ANTI COAGULATION 5 MG HYDRALAZINE ANESTHESIA/SEDATION: Moderate (conscious) sedation was employed during this procedure. A total of Versed 2.0 mg and Fentanyl 100 mcg was administered intravenously. Moderate Sedation Time: 180 minutes. The patient's level of consciousness and vital signs were monitored continuously by radiology nursing throughout the procedure under my direct supervision. CONTRAST:  200 cc Isovue-300 FLUOROSCOPY TIME:  Fluoroscopy Time: 38 minutes 12 seconds (104.9 mGy). COMPLICATIONS: None PROCEDURE: Informed consent was obtained from the patient following explanation of the procedure, risks, benefits and alternatives. The patient understands, agrees and consents for the procedure. All questions were addressed. A time out was performed prior to the initiation of the procedure. Maximal barrier sterile technique utilized including caps, mask, sterile gowns, sterile gloves, large sterile drape, hand hygiene, and Betadine prep. Ultrasound survey of the right inguinal region was performed with images stored and sent to PACs. A micropuncture needle was used access the right common femoral artery under ultrasound. With excellent arterial blood flow returned, and an .018 micro wire was passed through the needle, observed enter the abdominal aorta under fluoroscopy. The needle was removed, and a micropuncture sheath was  placed over the wire. The inner dilator and wire were removed, and an 035 Bentson wire was advanced under fluoroscopy into the abdominal aorta. The sheath was removed and a standard 5 [place] vascular sheath was placed. The dilator was removed and the sheath was flushed. Omni Flush catheter was advanced over the Bentson wire to the aortic bifurcation. Pelvic angiogram performed. Omni Flush catheter was then used to navigate the Bentson wire into the left external iliac artery. Omni Flush catheter was removed, and 100 cm vertebral catheter was advanced into the external iliac artery. Angiogram of the left lower extremity was performed. Rosen wire was then placed through the vertebral catheter and the 5 French short sheath was exchanged for a 6 [place] braided 65cm Arrow sheath. Sheath was advanced into the proximal superficial femoral artery. Multiple obliquities at the left knee were required in order to visualize the occlusion of the popliteal artery given the left knee arthroplasty hardware. Once adequate angiogram was performed, the proximal occlusion was engaged with the vertebral catheter and an 035 Coons wire. The occlusion was traversed with the vertebral catheter, with the vertebral catheter in the below knee  popliteal artery. Wire was removed and a luminal position was confirmed with injection. 014 BMW wire was then advanced through the vertebral catheter and the vertebral catheter was removed. Balloon angioplasty was then performed to a diameter of 4 mm along the length of the abnormal distal superficial femoral artery and the popliteal occlusion into the below knee popliteal artery. A Supera 4 mm x 80 mm was deployed in the popliteal artery across the occluded site, with the stent structure extended proximally with a 5 mm x 80 mm stent. Repeat angiogram was performed. Balloon angioplasty was then performed along the length of the stent site with 5 mm by 60 mm. Combination of angled CXi catheter and 016  Fathom wire were then used to engage the distal tibioperoneal trunk occlusion and the posterior tibial artery occlusion. This catheter and wire combination were successful with traversing the inclusion. Once the wire was removed and a luminal position was confirmed, the BMW wire was advanced through the CXI catheter into the posterior tibial artery. Balloon angioplasty was performed with 3 mm diameter balloon. Angiogram was performed, confirming patency to the ankle. All catheters and wires were removed. Final angiogram was performed. The sheath was then withdrawn to the right external iliac artery and angiogram was performed. The braided sheath was then exchanged for a short 6 [place] sheath. Given that the ACT greater than 150 at the time of conclusion, sheath remain in place on a pressurized saline bag while heparin was metabolites. The patient tolerated the procedure well and remained hemodynamically stable throughout. No complications were encountered and no significant blood loss. FINDINGS: Ultrasound survey of the right common femoral artery demonstrates mild calcifications with patent vessel. Angiogram of the pelvic vasculature demonstrates dense calcium with mild atherosclerotic changes bilaterally. Bilateral L4 and L5 lumbar arteries are patent. Bilateral hypogastric arteries are patent with patent pelvic vasculature. No significant stenosis of the right or left external iliac arteries. Angiogram of the left lower extremity demonstrates mild disease of the proximal left superficial femoral artery with patent profunda femoris. 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 The below knee popliteal artery is patent from collateral flow. Anterior tibial artery origin is patent, occluded in the proximal third beyond the origin. Distal tibioperoneal trunk is occluded with occlusion extending  into the proximal posterior tibial artery and peroneal artery. Collateral flow reconstitutes the distal peroneal artery, posterior tibial artery, and anterior tibial artery which remain patent at the ankle. There is moderate disease of the distal posterior tibial artery involving the common plantar, with the lateral plantar artery remain patent. Status post treatment of popliteal occlusion and placement of overlapping stent system proximally into the disease superficial femoral artery, there is excellent flow through the SFA and popliteal artery with no dissection. Continued flow through the proximal anterior tibial artery. Status post treatment of tibioperoneal occlusion and revascularization of the posterior tibial artery there is in-line flow restored through the posterior tibial artery to the ankle. At the conclusion of the case there is palpable posterior tibial pulse on the left. IMPRESSION: Status post left lower extremity angiogram and revascularization of chronic total occlusion of popliteal artery, distal TP trunk, and origin of the posterior tibial artery with restoration of in-line flow to the ankle via the posterior tibial artery after stenting of the popliteal occlusion. Signed, Dulcy Fanny. Earleen Newport DO [facility] Radiology PLAN: The  right common femoral artery sheath will stay until with the ACT is below 200. Plan for closure with Exoseal. Gentle hydration IV. Right leg straight until the sheath is out. After the sheath is out the right hip will stay straight for 4 hours. Plan on DC home today. Should the patient have plateau of wound healing, the next target would be left anterior tibial artery in the angiosome, with previous discussion of tibial access and anterior tibial artery revascularization. Electronically Signed   By: Corrie Mckusick D.O.   On: 03/25/2017 17:16   Ir Angiogram Selective Each Additional Vessel  Result Date: 03/25/2017 INDICATION:  83 year old female with a history of left lower extremity critical limb ischemia and a nonhealing amputation site. Prior ankle brachial index measures in the mild range of arterial occlusive disease, with the segmental exam demonstrating evidence of a distal femoral or popliteal occlusion. CT performed 03/04/2017 confirms popliteal occlusion with questionable patency of the tibial vessels. She presents today for an angiogram and attempted treatment of the popliteal occlusion. EXAM: ULTRASOUND GUIDED ACCESS OF RIGHT COMMON FEMORAL ARTERY ANGIOGRAM LEFT LOWER EXTREMITY REVASCULARIZATION OF OCCLUDED POPLITEAL ARTERY WITH ANGIOPLASTY AND STENTING. REVASCULARIZATION OF OCCLUDED TIBIOPERONEAL ARTERY AND POSTERIOR TIBIAL ARTERY WITH BALLOON ANGIOPLASTY AND RESTORATION OF IN-LINE FLOW TO THE ANKLE MEDICATIONS: 200 MCG NITROGLYCERIN INTRA-ARTERIAL 8000 UNITS HEPARIN FOR ANTI COAGULATION 5 MG HYDRALAZINE ANESTHESIA/SEDATION: Moderate (conscious) sedation was employed during this procedure. A total of Versed 2.0 mg and Fentanyl 100 mcg was administered intravenously. Moderate Sedation Time: 180 minutes. The patient's level of consciousness and vital signs were monitored continuously by radiology nursing throughout the procedure under my direct supervision. CONTRAST:  200 cc Isovue-300 FLUOROSCOPY TIME:  Fluoroscopy Time: 38 minutes 12 seconds (104.9 mGy). COMPLICATIONS: None PROCEDURE: Informed consent was obtained from the patient following explanation of the procedure, risks, benefits and alternatives. The patient understands, agrees and consents for the procedure. All questions were addressed. A time out was performed prior to the initiation of the procedure. Maximal barrier sterile technique utilized including caps, mask, sterile gowns, sterile gloves, large sterile drape, hand hygiene, and Betadine prep. Ultrasound survey of the right inguinal region was performed with images stored and sent to PACs. A micropuncture  needle was used access the right common femoral artery under ultrasound. With excellent arterial blood flow returned, and an .018 micro wire was passed through the needle, observed enter the abdominal aorta under fluoroscopy. The needle was removed, and a micropuncture sheath was placed over the wire. The inner dilator and wire were removed, and an 035 Bentson wire was advanced under fluoroscopy into the abdominal aorta. The sheath was removed and a standard 5 [place] vascular sheath was placed. The dilator was removed and the sheath was flushed. Omni Flush catheter was advanced over the Bentson wire to the aortic bifurcation. Pelvic angiogram performed. Omni Flush catheter was then used to navigate the Bentson wire into the left external iliac artery. Omni Flush catheter was removed, and 100 cm vertebral catheter was advanced into the external iliac artery. Angiogram of the left lower extremity was performed. Rosen wire was then placed through the vertebral catheter and the 5 French short sheath was exchanged for a 6 [place] braided 65cm Arrow sheath. Sheath was advanced into the proximal superficial femoral artery. Multiple obliquities at the left knee were required in order to visualize the occlusion of the popliteal artery given the left knee arthroplasty hardware. Once adequate angiogram was performed, the proximal occlusion was engaged with the vertebral catheter and  an 035 Coons wire. The occlusion was traversed with the vertebral catheter, with the vertebral catheter in the below knee popliteal artery. Wire was removed and a luminal position was confirmed with injection. 014 BMW wire was then advanced through the vertebral catheter and the vertebral catheter was removed. Balloon angioplasty was then performed to a diameter of 4 mm along the length of the abnormal distal superficial femoral artery and the popliteal occlusion into the below knee popliteal artery. A Supera 4 mm x 80 mm was deployed in the  popliteal artery across the occluded site, with the stent structure extended proximally with a 5 mm x 80 mm stent. Repeat angiogram was performed. Balloon angioplasty was then performed along the length of the stent site with 5 mm by 60 mm. Combination of angled CXi catheter and 016 Fathom wire were then used to engage the distal tibioperoneal trunk occlusion and the posterior tibial artery occlusion. This catheter and wire combination were successful with traversing the inclusion. Once the wire was removed and a luminal position was confirmed, the BMW wire was advanced through the CXI catheter into the posterior tibial artery. Balloon angioplasty was performed with 3 mm diameter balloon. Angiogram was performed, confirming patency to the ankle. All catheters and wires were removed. Final angiogram was performed. The sheath was then withdrawn to the right external iliac artery and angiogram was performed. The braided sheath was then exchanged for a short 6 [place] sheath. Given that the ACT greater than 150 at the time of conclusion, sheath remain in place on a pressurized saline bag while heparin was metabolites. The patient tolerated the procedure well and remained hemodynamically stable throughout. No complications were encountered and no significant blood loss. FINDINGS: Ultrasound survey of the right common femoral artery demonstrates mild calcifications with patent vessel. Angiogram of the pelvic vasculature demonstrates dense calcium with mild atherosclerotic changes bilaterally. Bilateral L4 and L5 lumbar arteries are patent. Bilateral hypogastric arteries are patent with patent pelvic vasculature. No significant stenosis of the right or left external iliac arteries. Angiogram of the left lower extremity demonstrates mild disease of the proximal left superficial femoral artery with patent profunda femoris. There is moderate disease at the distal third of the superficial femoral artery, with chronic total  occlusion of the popliteal artery in the behind the knee segment. The below knee popliteal artery is patent from collateral flow. Anterior tibial artery origin is patent, occluded in the proximal third beyond the origin. Distal tibioperoneal trunk is occluded with occlusion extending into the proximal posterior tibial artery and peroneal artery. Collateral flow reconstitutes the distal peroneal artery, posterior tibial artery, and anterior tibial artery which remain patent at the ankle. There is moderate disease of the distal posterior tibial artery involving the common plantar, with the lateral plantar artery remain patent. Status post treatment of popliteal occlusion and placement of overlapping stent system proximally into the disease superficial femoral artery, there is excellent flow through the SFA and popliteal artery with no dissection. Continued flow through the proximal anterior tibial artery. Status post treatment of tibioperoneal occlusion and revascularization of the posterior tibial artery there is in-line flow restored through the posterior tibial artery to the ankle. At the conclusion of the case there is palpable posterior tibial pulse on the left. IMPRESSION: Status post left lower extremity angiogram and revascularization of chronic total occlusion of popliteal artery, distal TP trunk, and origin of the posterior tibial artery with restoration of in-line flow to the ankle via the posterior tibial artery  after stenting of the popliteal occlusion. Signed, Dulcy Fanny. Earleen Newport DO [facility] Radiology PLAN: The right common femoral artery sheath will stay until with the ACT is below 200. Plan for closure with Exoseal. Gentle hydration IV. Right leg straight until the sheath is out. After the sheath is out the right hip will stay straight for 4 hours. Plan on DC home today. Should the patient have plateau of wound healing, the next target would be left  anterior tibial artery in the angiosome, with previous discussion of tibial access and anterior tibial artery revascularization. Electronically Signed   By: Corrie Mckusick D.O.   On: 03/25/2017 17:16   Ir Fem Pop Art Stent Inc Pta Mod Sed  Result Date: 03/25/2017 INDICATION: 83 year old female with a history of left lower extremity critical limb ischemia and a nonhealing amputation site. Prior ankle brachial index measures in the mild range of arterial occlusive disease, with the segmental exam demonstrating evidence of a distal femoral or popliteal occlusion. CT performed 03/04/2017 confirms popliteal occlusion with questionable patency of the tibial vessels. She presents today for an angiogram and attempted treatment of the popliteal occlusion. EXAM: ULTRASOUND GUIDED ACCESS OF RIGHT COMMON FEMORAL ARTERY ANGIOGRAM LEFT LOWER EXTREMITY REVASCULARIZATION OF OCCLUDED POPLITEAL ARTERY WITH ANGIOPLASTY AND STENTING. REVASCULARIZATION OF OCCLUDED TIBIOPERONEAL ARTERY AND POSTERIOR TIBIAL ARTERY WITH BALLOON ANGIOPLASTY AND RESTORATION OF IN-LINE FLOW TO THE ANKLE MEDICATIONS: 200 MCG NITROGLYCERIN INTRA-ARTERIAL 8000 UNITS HEPARIN FOR ANTI COAGULATION 5 MG HYDRALAZINE ANESTHESIA/SEDATION: Moderate (conscious) sedation was employed during this procedure. A total of Versed 2.0 mg and Fentanyl 100 mcg was administered intravenously. Moderate Sedation Time: 180 minutes. The patient's level of consciousness and vital signs were monitored continuously by radiology nursing throughout the procedure under my direct supervision. CONTRAST:  200 cc Isovue-300 FLUOROSCOPY TIME:  Fluoroscopy Time: 38 minutes 12 seconds (104.9 mGy). COMPLICATIONS: None PROCEDURE: Informed consent was obtained from the patient following explanation of the procedure, risks, benefits and alternatives. The patient understands, agrees and consents for the procedure. All questions were addressed. A time out was performed prior to the initiation of the  procedure. Maximal barrier sterile technique utilized including caps, mask, sterile gowns, sterile gloves, large sterile drape, hand hygiene, and Betadine prep. Ultrasound survey of the right inguinal region was performed with images stored and sent to PACs. A micropuncture needle was used access the right common femoral artery under ultrasound. With excellent arterial blood flow returned, and an .018 micro wire was passed through the needle, observed enter the abdominal aorta under fluoroscopy. The needle was removed, and a micropuncture sheath was placed over the wire. The inner dilator and wire were removed, and an 035 Bentson wire was advanced under fluoroscopy into the abdominal aorta. The sheath was removed and a standard 5 [place] vascular sheath was placed. The dilator was removed and the sheath was flushed. Omni Flush catheter was advanced over the Bentson wire to the aortic bifurcation. Pelvic angiogram performed. Omni Flush catheter was then used to navigate the Bentson wire into the left external iliac artery. Omni Flush catheter was removed, and 100 cm vertebral catheter was advanced into the external iliac artery. Angiogram of the left lower extremity was performed. Rosen wire was then placed through the vertebral catheter and the 5 French short sheath was exchanged for a 6 [place] braided 65cm Arrow sheath. Sheath was advanced into the proximal superficial femoral artery. Multiple obliquities at the left knee were required in order to visualize the occlusion of the  popliteal artery given the left knee arthroplasty hardware. Once adequate angiogram was performed, the proximal occlusion was engaged with the vertebral catheter and an 035 Coons wire. The occlusion was traversed with the vertebral catheter, with the vertebral catheter in the below knee popliteal artery. Wire was removed and a luminal position was confirmed with injection. 014 BMW wire was then advanced through the vertebral catheter and the  vertebral catheter was removed. Balloon angioplasty was then performed to a diameter of 4 mm along the length of the abnormal distal superficial femoral artery and the popliteal occlusion into the below knee popliteal artery. A Supera 4 mm x 80 mm was deployed in the popliteal artery across the occluded site, with the stent structure extended proximally with a 5 mm x 80 mm stent. Repeat angiogram was performed. Balloon angioplasty was then performed along the length of the stent site with 5 mm by 60 mm. Combination of angled CXi catheter and 016 Fathom wire were then used to engage the distal tibioperoneal trunk occlusion and the posterior tibial artery occlusion. This catheter and wire combination were successful with traversing the inclusion. Once the wire was removed and a luminal position was confirmed, the BMW wire was advanced through the CXI catheter into the posterior tibial artery. Balloon angioplasty was performed with 3 mm diameter balloon. Angiogram was performed, confirming patency to the ankle. All catheters and wires were removed. Final angiogram was performed. The sheath was then withdrawn to the right external iliac artery and angiogram was performed. The braided sheath was then exchanged for a short 6 [place] sheath. Given that the ACT greater than 150 at the time of conclusion, sheath remain in place on a pressurized saline bag while heparin was metabolites. The patient tolerated the procedure well and remained hemodynamically stable throughout. No complications were encountered and no significant blood loss. FINDINGS: Ultrasound survey of the right common femoral artery demonstrates mild calcifications with patent vessel. Angiogram of the pelvic vasculature demonstrates dense calcium with mild atherosclerotic changes bilaterally. Bilateral L4 and L5 lumbar arteries are patent. Bilateral hypogastric arteries are patent with patent pelvic vasculature. No significant stenosis of the right or left  external iliac arteries. Angiogram of the left lower extremity demonstrates mild disease of the proximal left superficial femoral artery with patent profunda femoris. 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 The below knee popliteal artery is patent from collateral flow. Anterior tibial artery origin is patent, occluded in the proximal third beyond the origin. Distal tibioperoneal trunk is occluded with occlusion extending into the proximal posterior tibial artery and peroneal artery. Collateral flow reconstitutes the distal peroneal artery, posterior tibial artery, and anterior tibial artery which remain patent at the ankle. There is moderate disease of the distal posterior tibial artery involving the common plantar, with the lateral plantar artery remain patent. Status post treatment of popliteal occlusion and placement of overlapping stent system proximally into the disease superficial femoral artery, there is excellent flow through the SFA and popliteal artery with no dissection. Continued flow through the proximal anterior tibial artery. Status post treatment of tibioperoneal occlusion and revascularization of the posterior tibial artery there is in-line flow restored through the posterior tibial artery to the ankle. At the conclusion of the case there is palpable posterior tibial pulse on the left. IMPRESSION: Status post left lower extremity angiogram and revascularization of chronic total occlusion of popliteal artery,  distal TP trunk, and origin of the posterior tibial artery with restoration of in-line flow to the ankle via the posterior tibial artery after stenting of the popliteal occlusion. Signed, Dulcy Fanny. Earleen Newport DO [facility] Radiology PLAN: The right common femoral artery sheath will stay until with the ACT is below 200. Plan  for closure with Exoseal. Gentle hydration IV. Right leg straight until the sheath is out. After the sheath is out the right hip will stay straight for 4 hours. Plan on DC home today. Should the patient have plateau of wound healing, the next target would be left anterior tibial artery in the angiosome, with previous discussion of tibial access and anterior tibial artery revascularization. Electronically Signed   By: Corrie Mckusick D.O.   On: 03/25/2017 17:16   Ir Tib-pero Art Pta Mod Sed  Result Date: 03/25/2017 INDICATION: 83 year old female with a history of left lower extremity critical limb ischemia and a nonhealing amputation site. Prior ankle brachial index measures in the mild range of arterial occlusive disease, with the segmental exam demonstrating evidence of a distal femoral or popliteal occlusion. CT performed 03/04/2017 confirms popliteal occlusion with questionable patency of the tibial vessels. She presents today for an angiogram and attempted treatment of the popliteal occlusion. EXAM: ULTRASOUND GUIDED ACCESS OF RIGHT COMMON FEMORAL ARTERY ANGIOGRAM LEFT LOWER EXTREMITY REVASCULARIZATION OF OCCLUDED POPLITEAL ARTERY WITH ANGIOPLASTY AND STENTING. REVASCULARIZATION OF OCCLUDED TIBIOPERONEAL ARTERY AND POSTERIOR TIBIAL ARTERY WITH BALLOON ANGIOPLASTY AND RESTORATION OF IN-LINE FLOW TO THE ANKLE MEDICATIONS: 200 MCG NITROGLYCERIN INTRA-ARTERIAL 8000 UNITS HEPARIN FOR ANTI COAGULATION 5 MG HYDRALAZINE ANESTHESIA/SEDATION: Moderate (conscious) sedation was employed during this procedure. A total of Versed 2.0 mg and Fentanyl 100 mcg was administered intravenously. Moderate Sedation Time: 180 minutes. The patient's level of consciousness and vital signs were monitored continuously by radiology nursing throughout the procedure under my direct supervision. CONTRAST:  200 cc Isovue-300 FLUOROSCOPY TIME:  Fluoroscopy Time: 38 minutes 12 seconds (104.9 mGy). COMPLICATIONS: None PROCEDURE: Informed consent  was obtained from the patient following explanation of the procedure, risks, benefits and alternatives. The patient understands, agrees and consents for the procedure. All questions were addressed. A time out was performed prior to the initiation of the procedure. Maximal barrier sterile technique utilized including caps, mask, sterile gowns, sterile gloves, large sterile drape, hand hygiene, and Betadine prep. Ultrasound survey of the right inguinal region was performed with images stored and sent to PACs. A micropuncture needle was used access the right common femoral artery under ultrasound. With excellent arterial blood flow returned, and an .018 micro wire was passed through the needle, observed enter the abdominal aorta under fluoroscopy. The needle was removed, and a micropuncture sheath was placed over the wire. The inner dilator and wire were removed, and an 035 Bentson wire was advanced under fluoroscopy into the abdominal aorta. The sheath was removed and a standard 5 [place] vascular sheath was placed. The dilator was removed and the sheath was flushed. Omni Flush catheter was advanced over the Bentson wire to the aortic bifurcation. Pelvic angiogram performed. Omni Flush catheter was then used to navigate the Bentson wire into the left external iliac artery. Omni Flush catheter was removed, and 100 cm vertebral catheter was advanced into the external iliac artery. Angiogram of the left lower extremity was performed. Rosen wire was then placed through the vertebral catheter and the 5 French short sheath was exchanged for a 6 [place] braided 65cm Arrow sheath. Sheath was advanced into the  proximal superficial femoral artery. Multiple obliquities at the left knee were required in order to visualize the occlusion of the popliteal artery given the left knee arthroplasty hardware. Once adequate angiogram was performed, the proximal occlusion was engaged with the vertebral catheter and an 035 Coons wire. The  occlusion was traversed with the vertebral catheter, with the vertebral catheter in the below knee popliteal artery. Wire was removed and a luminal position was confirmed with injection. 014 BMW wire was then advanced through the vertebral catheter and the vertebral catheter was removed. Balloon angioplasty was then performed to a diameter of 4 mm along the length of the abnormal distal superficial femoral artery and the popliteal occlusion into the below knee popliteal artery. A Supera 4 mm x 80 mm was deployed in the popliteal artery across the occluded site, with the stent structure extended proximally with a 5 mm x 80 mm stent. Repeat angiogram was performed. Balloon angioplasty was then performed along the length of the stent site with 5 mm by 60 mm. Combination of angled CXi catheter and 016 Fathom wire were then used to engage the distal tibioperoneal trunk occlusion and the posterior tibial artery occlusion. This catheter and wire combination were successful with traversing the inclusion. Once the wire was removed and a luminal position was confirmed, the BMW wire was advanced through the CXI catheter into the posterior tibial artery. Balloon angioplasty was performed with 3 mm diameter balloon. Angiogram was performed, confirming patency to the ankle. All catheters and wires were removed. Final angiogram was performed. The sheath was then withdrawn to the right external iliac artery and angiogram was performed. The braided sheath was then exchanged for a short 6 [place] sheath. Given that the ACT greater than 150 at the time of conclusion, sheath remain in place on a pressurized saline bag while heparin was metabolites. The patient tolerated the procedure well and remained hemodynamically stable throughout. No complications were encountered and no significant blood loss. FINDINGS: Ultrasound survey of the right common femoral artery demonstrates mild calcifications with patent vessel. Angiogram of the pelvic  vasculature demonstrates dense calcium with mild atherosclerotic changes bilaterally. Bilateral L4 and L5 lumbar arteries are patent. Bilateral hypogastric arteries are patent with patent pelvic vasculature. No significant stenosis of the right or left external iliac arteries. Angiogram of the left lower extremity demonstrates mild disease of the proximal left superficial femoral artery with patent profunda femoris. 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 The below knee popliteal artery is patent from collateral flow. Anterior tibial artery origin is patent, occluded in the proximal third beyond the origin. Distal tibioperoneal trunk is occluded with occlusion extending into the proximal posterior tibial artery and peroneal artery. Collateral flow reconstitutes the distal peroneal artery, posterior tibial artery, and anterior tibial artery which remain patent at the ankle. There is moderate disease of the distal posterior tibial artery involving the common plantar, with the lateral plantar artery remain patent. Status post treatment of popliteal occlusion and placement of overlapping stent system proximally into the disease superficial femoral artery, there is excellent flow through the SFA and popliteal artery with no dissection. Continued flow through the proximal anterior tibial artery. Status post treatment of tibioperoneal occlusion and revascularization of the posterior tibial artery there is in-line flow restored through the posterior tibial artery to the ankle. At the conclusion of the case there is palpable posterior tibial  pulse on the left. IMPRESSION: Status post left lower extremity angiogram and revascularization of chronic total occlusion of popliteal artery, distal TP trunk, and origin of the posterior tibial artery with restoration of in-line flow to the ankle via the  posterior tibial artery after stenting of the popliteal occlusion. Signed, Dulcy Fanny. Earleen Newport DO [facility] Radiology PLAN: The right common femoral artery sheath will stay until with the ACT is below 200. Plan for closure with Exoseal. Gentle hydration IV. Right leg straight until the sheath is out. After the sheath is out the right hip will stay straight for 4 hours. Plan on DC home today. Should the patient have plateau of wound healing, the next target would be left anterior tibial artery in the angiosome, with previous discussion of tibial access and anterior tibial artery revascularization. Electronically Signed   By: Corrie Mckusick D.O.   On: 03/25/2017 17:16   Ir US Guide Vasc Access Right  Result Date: 03/25/2017 INDICATION: 83 year old female with a history of left lower extremity critical limb ischemia and a nonhealing amputation site. Prior ankle brachial index measures in the mild range of arterial occlusive disease, with the segmental exam demonstrating evidence of a distal femoral or popliteal occlusion. CT performed 03/04/2017 confirms popliteal occlusion with questionable patency of the tibial vessels. She presents today for an angiogram and attempted treatment of the popliteal occlusion. EXAM: ULTRASOUND GUIDED ACCESS OF RIGHT COMMON FEMORAL ARTERY ANGIOGRAM LEFT LOWER EXTREMITY REVASCULARIZATION OF OCCLUDED POPLITEAL ARTERY WITH ANGIOPLASTY AND STENTING. REVASCULARIZATION OF OCCLUDED TIBIOPERONEAL ARTERY AND POSTERIOR TIBIAL ARTERY WITH BALLOON ANGIOPLASTY AND RESTORATION OF IN-LINE FLOW TO THE ANKLE MEDICATIONS: 200 MCG NITROGLYCERIN INTRA-ARTERIAL 8000 UNITS HEPARIN FOR ANTI COAGULATION 5 MG HYDRALAZINE ANESTHESIA/SEDATION: Moderate (conscious) sedation was employed during this procedure. A total of Versed 2.0 mg and Fentanyl 100 mcg was administered intravenously. Moderate Sedation Time: 180 minutes. The patient's level of consciousness and  vital signs were monitored continuously by radiology nursing throughout the procedure under my direct supervision. CONTRAST:  200 cc Isovue-300 FLUOROSCOPY TIME:  Fluoroscopy Time: 38 minutes 12 seconds (104.9 mGy). COMPLICATIONS: None PROCEDURE: Informed consent was obtained from the patient following explanation of the procedure, risks, benefits and alternatives. The patient understands, agrees and consents for the procedure. All questions were addressed. A time out was performed prior to the initiation of the procedure. Maximal barrier sterile technique utilized including caps, mask, sterile gowns, sterile gloves, large sterile drape, hand hygiene, and Betadine prep. Ultrasound survey of the right inguinal region was performed with images stored and sent to PACs. A micropuncture needle was used access the right common femoral artery under ultrasound. With excellent arterial blood flow returned, and an .018 micro wire was passed through the needle, observed enter the abdominal aorta under fluoroscopy. The needle was removed, and a micropuncture sheath was placed over the wire. The inner dilator and wire were removed, and an 035 Bentson wire was advanced under fluoroscopy into the abdominal aorta. The sheath was removed and a standard 5 [place] vascular sheath was placed. The dilator was removed and the sheath was flushed. Omni Flush catheter was advanced over the Bentson wire to the aortic bifurcation. Pelvic angiogram performed. Omni Flush catheter was then used to navigate the Bentson wire into the left external iliac artery. Omni Flush catheter was removed, and 100 cm vertebral catheter was advanced into the external iliac artery. Angiogram of the left lower extremity was performed. Rosen wire was then placed through the vertebral catheter and  the 5 [place] short sheath was exchanged for a 6 [place] braided 65cm Arrow sheath. Sheath was advanced into the proximal superficial femoral artery. Multiple obliquities at  the left knee were required in order to visualize the occlusion of the popliteal artery given the left knee arthroplasty hardware. Once adequate angiogram was performed, the proximal occlusion was engaged with the vertebral catheter and an 035 Coons wire. The occlusion was traversed with the vertebral catheter, with the vertebral catheter in the below knee popliteal artery. Wire was removed and a luminal position was confirmed with injection. 014 BMW wire was then advanced through the vertebral catheter and the vertebral catheter was removed. Balloon angioplasty was then performed to a diameter of 4 mm along the length of the abnormal distal superficial femoral artery and the popliteal occlusion into the below knee popliteal artery. A Supera 4 mm x 80 mm was deployed in the popliteal artery across the occluded site, with the stent structure extended proximally with a 5 mm x 80 mm stent. Repeat angiogram was performed. Balloon angioplasty was then performed along the length of the stent site with 5 mm by 60 mm. Combination of angled CXi catheter and 016 Fathom wire were then used to engage the distal tibioperoneal trunk occlusion and the posterior tibial artery occlusion. This catheter and wire combination were successful with traversing the inclusion. Once the wire was removed and a luminal position was confirmed, the BMW wire was advanced through the CXI catheter into the posterior tibial artery. Balloon angioplasty was performed with 3 mm diameter balloon. Angiogram was performed, confirming patency to the ankle. All catheters and wires were removed. Final angiogram was performed. The sheath was then withdrawn to the right external iliac artery and angiogram was performed. The braided sheath was then exchanged for a short 6 [place] sheath. Given that the ACT greater than 150 at the time of conclusion, sheath remain in place on a pressurized saline bag while heparin was metabolites. The patient tolerated the  procedure well and remained hemodynamically stable throughout. No complications were encountered and no significant blood loss. FINDINGS: Ultrasound survey of the right common femoral artery demonstrates mild calcifications with patent vessel. Angiogram of the pelvic vasculature demonstrates dense calcium with mild atherosclerotic changes bilaterally. Bilateral L4 and L5 lumbar arteries are patent. Bilateral hypogastric arteries are patent with patent pelvic vasculature. No significant stenosis of the right or left external iliac arteries. Angiogram of the left lower extremity demonstrates mild disease of the proximal left superficial femoral artery with patent profunda femoris. 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 The below knee popliteal artery is patent from collateral flow. Anterior tibial artery origin is patent, occluded in the proximal third beyond the origin. Distal tibioperoneal trunk is occluded with occlusion extending into the proximal posterior tibial artery and peroneal artery. Collateral flow reconstitutes the distal peroneal artery, posterior tibial artery, and anterior tibial artery which remain patent at the ankle. There is moderate disease of the distal posterior tibial artery involving the common plantar, with the lateral plantar artery remain patent. Status post treatment of popliteal occlusion and placement of overlapping stent system proximally into the disease superficial femoral artery, there is excellent flow through the SFA and popliteal artery with no dissection. Continued flow through the proximal anterior tibial artery. Status post treatment of tibioperoneal occlusion and revascularization of the posterior tibial artery there is in-line flow  restored through the posterior tibial artery to the ankle. At the conclusion of the case there is palpable posterior  tibial pulse on the left. IMPRESSION: Status post left lower extremity angiogram and revascularization of chronic total occlusion of popliteal artery, distal TP trunk, and origin of the posterior tibial artery with restoration of in-line flow to the ankle via the posterior tibial artery after stenting of the popliteal occlusion. Signed, Dulcy Fanny. Earleen Newport DO [facility] Radiology PLAN: The right common femoral artery sheath will stay until with the ACT is below 200. Plan for closure with Exoseal. Gentle hydration IV. Right leg straight until the sheath is out. After the sheath is out the right hip will stay straight for 4 hours. Plan on DC home today. Should the patient have plateau of wound healing, the next target would be left anterior tibial artery in the angiosome, with previous discussion of tibial access and anterior tibial artery revascularization. Electronically Signed   By: Corrie Mckusick D.O.   On: 03/25/2017 17:16    Labs:  CBC:  Recent Labs  02/13/17 2224 02/13/17 2236 03/18/17 1138 03/25/17 0645  WBC 8.8  --  11.9* 12.7*  HGB 16.3* 16.3* 15.2* 15.6*  HCT 48.5* 48.0* 45.3 45.7  PLT 170  --  215 329    COAGS:  Recent Labs  01/01/17 02/04/17 0935 03/25/17 0645  INR 1.2* 1.1 1.16  APTT  --   --  32    BMP:  Recent Labs  02/13/17 2236 03/18/17 1138 03/25/17 0645  NA 141 128* 137  K 3.7 4.5 4.3  CL 101 97* 100*  CO2  --  21* 27  GLUCOSE 296* 234* 214*  BUN 24* 27* 21*  CALCIUM  --  9.5 9.4  CREATININE 0.80 1.23* 0.84  GFRNONAA  --  39* >60  GFRAA  --  45* >60    LIVER FUNCTION TESTS:  Recent Labs  03/18/17 1138  BILITOT 1.5*  AST 23  ALT 24  ALKPHOS 77  PROT 6.8  ALBUMIN 3.5    TUMOR MARKERS: No results for input(s): AFPTM, CEA, CA199, CHROMGRNA in the last 8760 hours.  Assessment and Plan:  My impression is that the patient has suspected reocclusion of at least a component of the revascularized  distal left lower extremity outflow and runoff. I think she has adequate collateral supply such that she is not at imminent risk of tissue loss, and her symptoms are mild. Accordingly, we'll set her up for an outpatient arterial Doppler assessment as soon as possible, which will help direct subsequent therapy as needed. She will stay on her Coumadin and aspirin. I gave her my contact information with instructions to call with any worsening symptoms or other concerns; we have the option of admitting her for IV anticoagulation and assessment as an inpatient if required.  Thank you for this interesting consult.  I greatly enjoyed meeting [name] and look forward to participating in their care.  A copy of this report was sent to the requesting provider on this date.  Electronically Signed: Arlander Gillen III, DAYNE Merlie Noga 04/03/2017, 5:11 PM   I spent a total of    25 Minutes in face to face in clinical consultation, greater than 50% of which was counseling/coordinating care for ischemic left foot.

## 2017-04-03 NOTE — Telephone Encounter (Addendum)
[name] states there is a marked difference in the left foot and ankle with the wound, the left foot is cold, clammy with difficulty palpating pulse, she will inform [name] her circulation doctor. 04/18/2017-[name] states pt has been released from the hospital to [name] and the foot does not look good, [name] is taking care of and thinks pt should be seen prior to 04/28/2017 appt. I told [name], that I would transfer to schedulers to get in to see [name] Monday, but if problem worsened, increased redness, swelling, foul odor, fever go to the ER. [name] states understanding and I transferred to schedulers.

## 2017-04-04 ENCOUNTER — Other Ambulatory Visit (HOSPITAL_COMMUNITY): Payer: Self-pay | Admitting: Interventional Radiology

## 2017-04-04 ENCOUNTER — Telehealth: Payer: Self-pay | Admitting: Student

## 2017-04-04 ENCOUNTER — Other Ambulatory Visit: Payer: Self-pay | Admitting: Student

## 2017-04-04 ENCOUNTER — Ambulatory Visit (HOSPITAL_COMMUNITY)
Admission: RE | Admit: 2017-04-04 | Discharge: 2017-04-04 | Disposition: A | Discharge status: Home / Self Care | Payer: Medicare Other | Source: Ambulatory Visit | Attending: Interventional Radiology | Admitting: Interventional Radiology

## 2017-04-04 DIAGNOSIS — I739 Peripheral vascular disease, unspecified: Secondary | ICD-10-CM | POA: Diagnosis not present

## 2017-04-04 DIAGNOSIS — I998 Other disorder of circulatory system: Secondary | ICD-10-CM

## 2017-04-04 NOTE — Telephone Encounter (Signed)
Patient for possible LE intervention early next week.  She is currently on coumadin.  Per Dr. Earleen Newport, patient to stop coumadin and check INR Monday for possible procedure Tuesday.  Orders placed. Called patient to discuss.  She confirms plans for labwork Monday and understands she needs to hold her Coumadin.   Brynda Greathouse, MS RD PA-C

## 2017-04-07 ENCOUNTER — Encounter: Payer: Self-pay | Admitting: Podiatry

## 2017-04-07 ENCOUNTER — Other Ambulatory Visit (HOSPITAL_COMMUNITY)
Admission: AD | Admit: 2017-04-07 | Discharge: 2017-04-07 | Disposition: A | Discharge status: Home / Self Care | Payer: Medicare Other | Source: Ambulatory Visit | Attending: Interventional Radiology | Admitting: Interventional Radiology

## 2017-04-07 ENCOUNTER — Other Ambulatory Visit (HOSPITAL_COMMUNITY): Payer: Self-pay | Admitting: Interventional Radiology

## 2017-04-07 ENCOUNTER — Ambulatory Visit (INDEPENDENT_AMBULATORY_CARE_PROVIDER_SITE_OTHER): Payer: Medicare Other | Admitting: Podiatry

## 2017-04-07 ENCOUNTER — Other Ambulatory Visit: Payer: Self-pay | Admitting: *Deleted

## 2017-04-07 ENCOUNTER — Other Ambulatory Visit: Payer: Self-pay | Admitting: Radiology

## 2017-04-07 VITALS — BP 169/108 | HR 96 | Resp 16

## 2017-04-07 DIAGNOSIS — M86172 Other acute osteomyelitis, left ankle and foot: Secondary | ICD-10-CM

## 2017-04-07 DIAGNOSIS — L97522 Non-pressure chronic ulcer of other part of left foot with fat layer exposed: Secondary | ICD-10-CM | POA: Diagnosis not present

## 2017-04-07 DIAGNOSIS — I70245 Atherosclerosis of native arteries of left leg with ulceration of other part of foot: Secondary | ICD-10-CM | POA: Insufficient documentation

## 2017-04-07 DIAGNOSIS — I998 Other disorder of circulatory system: Secondary | ICD-10-CM

## 2017-04-07 DIAGNOSIS — Z9889 Other specified postprocedural states: Secondary | ICD-10-CM

## 2017-04-07 DIAGNOSIS — S81802A Unspecified open wound, left lower leg, initial encounter: Secondary | ICD-10-CM

## 2017-04-07 LAB — CBC WITH DIFFERENTIAL/PLATELET
BASOS ABS: 0 10*3/uL (ref 0.0–0.1)
Basophils Relative: 0 %
EOS ABS: 0.2 10*3/uL (ref 0.0–0.7)
Eosinophils Relative: 2 %
HCT: 45 % (ref 36.0–46.0)
Hemoglobin: 14.7 g/dL (ref 12.0–15.0)
LYMPHS ABS: 1.3 10*3/uL (ref 0.7–4.0)
Lymphocytes Relative: 12 %
MCH: 31.3 pg (ref 26.0–34.0)
MCHC: 32.7 g/dL (ref 30.0–36.0)
MCV: 95.9 fL (ref 78.0–100.0)
MONO ABS: 0.6 10*3/uL (ref 0.1–1.0)
Monocytes Relative: 6 %
NEUTROS PCT: 80 %
Neutro Abs: 8.6 10*3/uL — ABNORMAL HIGH (ref 1.7–7.7)
PLATELETS: 322 10*3/uL (ref 150–400)
RBC: 4.69 MIL/uL (ref 3.87–5.11)
RDW: 13.3 % (ref 11.5–15.5)
WBC: 10.7 10*3/uL — AB (ref 4.0–10.5)

## 2017-04-07 LAB — BASIC METABOLIC PANEL
Anion gap: 12 (ref 5–15)
BUN: 16 mg/dL (ref 6–20)
CO2: 25 mmol/L (ref 22–32)
CREATININE: 0.83 mg/dL (ref 0.44–1.00)
Calcium: 9 mg/dL (ref 8.9–10.3)
Chloride: 101 mmol/L (ref 101–111)
Glucose, Bld: 314 mg/dL — ABNORMAL HIGH (ref 65–99)
Potassium: 3.6 mmol/L (ref 3.5–5.1)
SODIUM: 138 mmol/L (ref 135–145)

## 2017-04-07 LAB — PROTIME-INR
INR: 1.15
PROTHROMBIN TIME: 14.7 s (ref 11.4–15.2)

## 2017-04-07 NOTE — Progress Notes (Signed)
   Subjective:  Patient presents today for follow-up evaluation and treatment of the third toe amputation left foot. Date of surgery 02/24/2017. She states she is doing better but complains of some sharp pains to the area.    Objective/Physical Exam General: The patient is alert and oriented x3 in no acute distress.  Dermatology:  Wound #1 noted to the fifth digit left foot measuring [phone] cm (LxWxD).   Wound #2 noted to the lateral aspect of the left ankle measuring [phone] cm.   Wound #3 located to the third digit amputation stump measuring approximately [phone] cm.  Wound #4 located to the left lateral heel measuring 0.3 x 0.3 x 0.1 cm.  To the noted ulceration(s), there is no eschar. There is a moderate amount of slough, fibrin, and necrotic tissue noted. Moderate amount of purulent drainage also noted. Granulation tissue and wound base is pink.  There is no exposed bone muscle-tendon ligament or joint. There is no malodor. Periwound integrity is intact. Skin is warm, dry and supple bilateral lower extremities.  Vascular: Diminished pedal pulses bilaterally. No edema or erythema noted. Capillary refill delayed.  Neurological: Epicritic and protective threshold diminished bilaterally.   Musculoskeletal: Amputation site of the third digit left foot appears stable. Skin incision is well coapted with sutures intact. There is some maceration noted to the amputation site.  Assessment: #1 multiple ulcerations left foot and ankle secondary to diabetes mellitus #2 diabetes mellitus w/ peripheral neuropathy #3 peripheral vascular disease #4 status post third digit amputation left foot. Date of surgery 02/24/2017.  Plan of Care:  #1 Patient was evaluated. #2 medically necessary excisional debridement including subcutaneous tissue was performed using a tissue nipper and a chisel blade. Excisional debridement of all the necrotic nonviable tissue down to healthy bleeding viable  tissue was performed with post-debridement measurements same as pre-. #3 the wound was cleansed and dry sterile dressing applied. #4 vascular being managed by [facility], Dr. Jacqualyn Posey #5 continue home health dressing changes. Prisma and Aquacel Ag 3 times per week  #6 recommend sleeping in a recliner with extremities in a dependent position. #7 return to clinic in 2 weeks   [name], DPM [facility]  Dr. [name], [address]                                        [place], [place] [address]                Office [phone]  Fax [phone]

## 2017-04-08 ENCOUNTER — Inpatient Hospital Stay (HOSPITAL_COMMUNITY)
Admission: RE | Admit: 2017-04-08 | Discharge: 2017-04-10 | DRG: 300 | Disposition: A | Discharge status: Home / Self Care | Payer: Medicare Other | Source: Ambulatory Visit | Attending: Interventional Radiology | Admitting: Interventional Radiology

## 2017-04-08 ENCOUNTER — Encounter (HOSPITAL_COMMUNITY): Payer: Self-pay

## 2017-04-08 DIAGNOSIS — Z7982 Long term (current) use of aspirin: Secondary | ICD-10-CM

## 2017-04-08 DIAGNOSIS — I70229 Atherosclerosis of native arteries of extremities with rest pain, unspecified extremity: Secondary | ICD-10-CM | POA: Diagnosis present

## 2017-04-08 DIAGNOSIS — I743 Embolism and thrombosis of arteries of the lower extremities: Secondary | ICD-10-CM | POA: Diagnosis not present

## 2017-04-08 DIAGNOSIS — Z7901 Long term (current) use of anticoagulants: Secondary | ICD-10-CM

## 2017-04-08 DIAGNOSIS — I4891 Unspecified atrial fibrillation: Secondary | ICD-10-CM | POA: Diagnosis present

## 2017-04-08 DIAGNOSIS — K219 Gastro-esophageal reflux disease without esophagitis: Secondary | ICD-10-CM | POA: Diagnosis present

## 2017-04-08 DIAGNOSIS — Z96642 Presence of left artificial hip joint: Secondary | ICD-10-CM | POA: Diagnosis present

## 2017-04-08 DIAGNOSIS — I998 Other disorder of circulatory system: Secondary | ICD-10-CM

## 2017-04-08 DIAGNOSIS — I70202 Unspecified atherosclerosis of native arteries of extremities, left leg: Principal | ICD-10-CM | POA: Diagnosis present

## 2017-04-08 DIAGNOSIS — Z96651 Presence of right artificial knee joint: Secondary | ICD-10-CM | POA: Diagnosis present

## 2017-04-08 DIAGNOSIS — I739 Peripheral vascular disease, unspecified: Secondary | ICD-10-CM

## 2017-04-08 DIAGNOSIS — I7092 Chronic total occlusion of artery of the extremities: Secondary | ICD-10-CM | POA: Diagnosis present

## 2017-04-08 DIAGNOSIS — Z89421 Acquired absence of other right toe(s): Secondary | ICD-10-CM

## 2017-04-08 DIAGNOSIS — Z79899 Other long term (current) drug therapy: Secondary | ICD-10-CM

## 2017-04-08 HISTORY — PX: IR ANGIOGRAM EXTREMITY LEFT: IMG651

## 2017-04-08 HISTORY — PX: IR US GUIDE VASC ACCESS RIGHT: IMG2390

## 2017-04-08 HISTORY — PX: IR INFUSION THROMBOL ARTERIAL INITIAL (MS): IMG5376

## 2017-04-08 LAB — MRSA PCR SCREENING: MRSA by PCR: NEGATIVE

## 2017-04-08 LAB — CBC
HCT: 45.3 % (ref 36.0–46.0)
Hemoglobin: 14.8 g/dL (ref 12.0–15.0)
MCH: 31.4 pg (ref 26.0–34.0)
MCHC: 32.7 g/dL (ref 30.0–36.0)
MCV: 96 fL (ref 78.0–100.0)
PLATELETS: 269 10*3/uL (ref 150–400)
RBC: 4.72 MIL/uL (ref 3.87–5.11)
RDW: 13.1 % (ref 11.5–15.5)
WBC: 20.1 10*3/uL — ABNORMAL HIGH (ref 4.0–10.5)

## 2017-04-08 LAB — FIBRINOGEN: Fibrinogen: 348 mg/dL (ref 210–475)

## 2017-04-08 LAB — HEPARIN LEVEL (UNFRACTIONATED): Heparin Unfractionated: 0.3 IU/mL (ref 0.30–0.70)

## 2017-04-08 LAB — PROTIME-INR
INR: 1.19
Prothrombin Time: 15.2 seconds (ref 11.4–15.2)

## 2017-04-08 MED ORDER — HEPARIN (PORCINE) IN NACL 100-0.45 UNIT/ML-% IJ SOLN
800.0000 [IU]/h | INTRAMUSCULAR | Status: DC
  Administered 2017-04-08: 800 [IU]/h via INTRAVENOUS
  Filled 2017-04-08: qty 250

## 2017-04-08 MED ORDER — HYDROMORPHONE HCL 1 MG/ML IJ SOLN
1.0000 mg | INTRAMUSCULAR | Status: DC | PRN
  Administered 2017-04-08 – 2017-04-09 (×5): 1 mg via INTRAVENOUS
  Filled 2017-04-08 (×5): qty 1

## 2017-04-08 MED ORDER — FENTANYL CITRATE (PF) 100 MCG/2ML IJ SOLN
INTRAMUSCULAR | Status: AC | PRN
  Administered 2017-04-08 (×2): 25 ug via INTRAVENOUS
  Administered 2017-04-08: 50 ug via INTRAVENOUS

## 2017-04-08 MED ORDER — NICARDIPINE HCL IN NACL 20-0.86 MG/200ML-% IV SOLN
3.0000 mg/h | INTRAVENOUS | Status: DC
  Administered 2017-04-08: 5 mg/h via INTRAVENOUS
  Administered 2017-04-08: 3 mg/h via INTRAVENOUS
  Filled 2017-04-08 (×4): qty 200

## 2017-04-08 MED ORDER — HYDROMORPHONE HCL 1 MG/ML IJ SOLN
0.5000 mg | INTRAMUSCULAR | Status: DC | PRN
  Administered 2017-04-08: 0.5 mg via INTRAVENOUS
  Filled 2017-04-08: qty 0.5

## 2017-04-08 MED ORDER — ONDANSETRON HCL 4 MG/2ML IJ SOLN
4.0000 mg | Freq: Four times a day (QID) | INTRAMUSCULAR | Status: DC | PRN
  Administered 2017-04-08: 4 mg via INTRAVENOUS
  Filled 2017-04-08 (×2): qty 2

## 2017-04-08 MED ORDER — METOPROLOL TARTRATE 5 MG/5ML IV SOLN: 5.0000 mg | INTRAVENOUS | Status: DC | PRN

## 2017-04-08 MED ORDER — SODIUM CHLORIDE 0.9 % IV SOLN
INTRAVENOUS | Status: DC
  Administered 2017-04-08 – 2017-04-09 (×2): via INTRAVENOUS
  Filled 2017-04-08 (×3): qty 500

## 2017-04-08 MED ORDER — ORAL CARE MOUTH RINSE
15.0000 mL | Freq: Two times a day (BID) | OROMUCOSAL | Status: DC
  Administered 2017-04-09 – 2017-04-10 (×2): 15 mL via OROMUCOSAL

## 2017-04-08 MED ORDER — IOPAMIDOL (ISOVUE-300) INJECTION 61%
INTRAVENOUS | Status: AC
  Administered 2017-04-08: 40 mL
  Filled 2017-04-08: qty 100

## 2017-04-08 MED ORDER — SODIUM CHLORIDE 0.9 % IV SOLN: 250.0000 mL | INTRAVENOUS | Status: DC | PRN

## 2017-04-08 MED ORDER — HEPARIN SODIUM (PORCINE) 1000 UNIT/ML IJ SOLN
INTRAMUSCULAR | Status: AC
  Filled 2017-04-08: qty 1

## 2017-04-08 MED ORDER — METOPROLOL TARTRATE 5 MG/5ML IV SOLN
5.0000 mg | INTRAVENOUS | Status: AC | PRN
  Administered 2017-04-08 – 2017-04-10 (×3): 5 mg via INTRAVENOUS
  Filled 2017-04-08 (×3): qty 5

## 2017-04-08 MED ORDER — SODIUM CHLORIDE 0.9% FLUSH: 3.0000 mL | INTRAVENOUS | Status: DC | PRN

## 2017-04-08 MED ORDER — FENTANYL CITRATE (PF) 100 MCG/2ML IJ SOLN
INTRAMUSCULAR | Status: AC
  Administered 2017-04-08: 09:00:00
  Filled 2017-04-08: qty 2

## 2017-04-08 MED ORDER — HYDRALAZINE HCL 20 MG/ML IJ SOLN
INTRAMUSCULAR | Status: AC
  Filled 2017-04-08: qty 1

## 2017-04-08 MED ORDER — MIDAZOLAM HCL 2 MG/2ML IJ SOLN
INTRAMUSCULAR | Status: AC | PRN
  Administered 2017-04-08: 0.5 mg via INTRAVENOUS
  Administered 2017-04-08: 1 mg via INTRAVENOUS
  Administered 2017-04-08: 0.5 mg via INTRAVENOUS

## 2017-04-08 MED ORDER — TENECTEPLASE 50 MG IV KIT
0.5000 mg/h | PACK | INTRAVENOUS | Status: DC
  Administered 2017-04-08 – 2017-04-09 (×2): 0.5 mg/h via INTRA_ARTERIAL
  Filled 2017-04-08 (×2): qty 2

## 2017-04-08 MED ORDER — ACETAMINOPHEN 325 MG PO TABS: 650.0000 mg | ORAL_TABLET | Freq: Four times a day (QID) | ORAL | Status: DC | PRN

## 2017-04-08 MED ORDER — SODIUM CHLORIDE 0.9 % IV SOLN
INTRAVENOUS | Status: DC
  Administered 2017-04-08: 07:00:00 via INTRAVENOUS

## 2017-04-08 MED ORDER — LIDOCAINE HCL 1 % IJ SOLN
INTRAMUSCULAR | Status: AC
  Administered 2017-04-08: 09:00:00
  Filled 2017-04-08: qty 20

## 2017-04-08 MED ORDER — MIDAZOLAM HCL 2 MG/2ML IJ SOLN
INTRAMUSCULAR | Status: AC
  Administered 2017-04-08: 09:00:00
  Filled 2017-04-08: qty 2

## 2017-04-08 MED ORDER — SODIUM CHLORIDE 0.9% FLUSH: 3.0000 mL | Freq: Two times a day (BID) | INTRAVENOUS | Status: DC

## 2017-04-08 NOTE — Progress Notes (Signed)
ANTICOAGULATION CONSULT NOTE - Initial Consult  Pharmacy Consult for Heparin IV in combination with TNKase IA Indication: VTE treatment   Allergies  Allergen Reactions  . Ceftriaxone Sodium Other (See Comments)    Tingly feeling after given, then one hour later while vanco infusing pruritic rash  . Phenergan [Promethazine Hcl]     Hallucinations  . Sulfonamide Derivatives Hives  . Vancomycin Hcl     Rash happened 1hr after rocephin but immediately after infusing vancomycin    Patient Measurements: Height: 5\' 2"  (157.5 cm) Weight: 115 lb (52.2 kg) IBW/kg (Calculated) : 50.1 Heparin Dosing Weight: 52 kg  Vital Signs: Temp: 97.7 F (36.5 C) (05/22 0641) Temp Source: Oral (05/22 0641) BP: 173/96 (05/22 1215) Pulse Rate: 85 (05/22 1215)  Labs:  Recent Labs  04/07/17 0850 04/08/17 0701  HGB 14.7  --   HCT 45.0  --   PLT 322  --   LABPROT 14.7 15.2  INR 1.15 1.19  CREATININE 0.83  --     Estimated Creatinine Clearance: 38.5 mL/min (by C-G formula based on SCr of 0.83 mg/dL).   Medical History: Past Medical History:  Diagnosis Date  . Anemia   . Arthritis   . Dysrhythmia    hx brief AF post op hip surg  . GERD (gastroesophageal reflux disease)   . Hypertension     Medications:  Prescriptions Prior to Admission  Medication Sig Dispense Refill Last Dose  . acetaminophen (TYLENOL) 325 MG tablet Take 650 mg by mouth daily.    04/07/2017 at Unknown time  . aspirin EC 325 MG tablet Take 325 mg by mouth at bedtime.   04/07/2017 at Unknown time  . calcium carbonate (OSCAL) 1500 (600 Ca) MG TABS tablet Take 600 mg of elemental calcium by mouth daily with breakfast.   04/07/2017 at Unknown time  . ferrous sulfate 325 (65 FE) MG tablet Take 325 mg by mouth daily with breakfast.   04/07/2017 at Unknown time  . metoprolol succinate (TOPROL-XL) 25 MG 24 hr tablet Take 25 mg by mouth at bedtime.    04/07/2017 at Unknown time  . vitamin B-12 (CYANOCOBALAMIN) 1000 MCG tablet Take  1,000 mcg by mouth daily.   04/07/2017 at Unknown time  . Vitamin D, Ergocalciferol, (DRISDOL) 50000 units CAPS capsule Take 50,000 Units by mouth every 7 (seven) days. Sundays   04/07/2017 at Unknown time  . warfarin (COUMADIN) 2.5 MG tablet Take 1.25 mg by mouth daily at 6 PM.    04/02/2017 at 2000   Scheduled:  . sodium chloride flush  3 mL Intravenous Q12H   Infusions:  . sodium chloride    . heparin 800 Units/hr (04/08/17 1102)  . niCARDipine 5 mg/hr (04/08/17 1212)  . sodium chloride 0.9 % 500 mL with heparin 1,000 Units infusion 20 mL/hr at 04/08/17 1107  . tenecteplase (TNKase) infusion 0.5 mg/hr (04/08/17 1006)   PRN: sodium chloride, acetaminophen, HYDROmorphone (DILAUDID) injection, ondansetron (ZOFRAN) IV, sodium chloride flush  Assessment: Patient is a 83 yo female admitted 5/22 for IR TKAse with concomitant systemic heparin per pharmacy. Patient was on warfarin PTA for AFib, with last dose on 5/16 and admission INR of 1.19.   Baseline CBC shows hemoglobin and platelet count within normal limits. No bleeding noted. Heparin was initiated at 800 units/hr per MD.   Goal of Therapy:  Heparin level 0.2-0.5 units/ml Monitor platelets by anticoagulation protocol: Yes   Plan:  Continue heparin 800 units/hr IV (started at 1100) Follow up 6-hour level  Heparin level, CBC, and fibrinogen every 6 hours x24 hours Monitor signs/symptoms of bleeding Follow up transition back to oral anticoagulation post-procedure  Demetrius Charity, PharmD Acute Care Pharmacy Resident  Pager: [phone] 04/08/2017

## 2017-04-08 NOTE — Sedation Documentation (Signed)
Patient denies pain and is resting comfortably.  

## 2017-04-08 NOTE — Sedation Documentation (Signed)
Patient is resting comfortably. 

## 2017-04-08 NOTE — H&P (Signed)
Chief Complaint: Patient was seen in consultation today for left lower extremity arteriogram with possible revascularization intervention at the request of Dr Marilynn Rail  Referring Physician(s): Dr Marilynn Rail  Supervising Physician: Corrie Mckusick  Patient Status: [facility] - Out-pt  History of Present Illness: [name] is a 83 y.o. 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  Angiogram LLE.   Revascularization of occluded popliteal artery, and treatment of distal SFA disease, with overlapping Supera stents from the below knee pop extending to the SFA.  4.13mm distal, 5.0 mm proximal.  Post angioplasty to 42mm.   Revascularization of occluded TP trunk and PT artery, with restoration of in-line flow to the ankle via the PT.  Marland Kitchen  Re visit in IR clinic 04/03/17 : My impression is that the patient has suspected reocclusion of at least a component of the revascularized distal left lower extremity outflow and runoff. I think she has adequate collateral supply such that she is not at imminent risk of tissue loss, and her symptoms are mild.  Now scheduled for left low leg arteriogram with possible revascularization intervention with Dr Earleen Newport.  Past Medical History:  Diagnosis Date  . Anemia   . Arthritis   . Dysrhythmia    hx brief AF post op hip surg  . GERD (gastroesophageal reflux disease)   . Hypertension     Past Surgical History:  Procedure Laterality Date  . AMPUTATION Right 02/04/2013   Procedure: [name] ;  Surgeon: Wylene Simmer, MD;  Location: [place];  Service: Orthopedics;  Laterality: Right;  . APPENDECTOMY    . COLONOSCOPY    . EYE SURGERY     both cataracts  . INCISION AND DRAINAGE  2011   left hip inf-hemovac  . IR ANGIOGRAM EXTREMITY LEFT  03/25/2017  . IR ANGIOGRAM SELECTIVE EACH ADDITIONAL VESSEL  03/25/2017  . IR FEM POP ART STENT INC PTA MOD SED   03/25/2017  . IR TIB-PERO ART PTA MOD SED  03/25/2017  . IR US GUIDE VASC ACCESS RIGHT  03/25/2017  . KNEE ARTHROSCOPY  1995   left  . NECK MASS EXCISION    . TONSILLECTOMY    . TOTAL HIP ARTHROPLASTY  2006   left-fx  . TOTAL KNEE ARTHROPLASTY  [id number]   left  . TOTAL KNEE ARTHROPLASTY  2009   right    Allergies: Ceftriaxone sodium; Fentanyl; Sulfonamide derivatives; and Vancomycin hcl  Medications: Prior to Admission medications   Medication Sig Start Date End Date Taking? Authorizing Provider  acetaminophen (TYLENOL) 325 MG tablet Take 650 mg by mouth daily.    Yes [provider]  aspirin EC 325 MG tablet Take 325 mg by mouth at bedtime.   Yes [provider]  calcium carbonate (OSCAL) 1500 (600 Ca) MG TABS tablet Take 600 mg of elemental calcium by mouth daily with breakfast.   Yes [provider]  ferrous sulfate 325 (65 FE) MG tablet Take 325 mg by mouth daily with breakfast.   Yes [provider]  metoprolol succinate (TOPROL-XL) 25 MG 24 hr tablet Take 25 mg by mouth at bedtime.    Yes [provider]  vitamin B-12 (CYANOCOBALAMIN) 1000 MCG tablet Take 1,000 mcg by mouth daily.   Yes [provider]  Vitamin D, Ergocalciferol, (DRISDOL) 50000 units CAPS capsule Take 50,000 Units by mouth every 7 (seven) days.  Sundays   Yes [provider]  warfarin (COUMADIN) 2.5 MG tablet Take 1.25 mg by mouth daily at 6 PM.     [provider]     Family History  Problem Relation Age of Onset  . Cancer Mother     Social History   Social History  . Marital status: Married    Spouse name: N/A  . Number of children: N/A  . Years of education: N/A   Social History Main Topics  . Smoking status: Never Smoker  . Smokeless tobacco: Never Used  . Alcohol use No  . Drug use: No  . Sexual activity: Not Asked   Other Topics Concern  . None   Social History Narrative  . None    Review of Systems: A 12 point ROS  discussed and pertinent positives are indicated in the HPI above.  All other systems are negative.  Review of Systems  Constitutional: Positive for activity change. Negative for appetite change, fatigue and fever.  Respiratory: Negative for cough and choking.   Cardiovascular: Negative for chest pain.  Gastrointestinal: Negative for abdominal pain.  Musculoskeletal: Positive for gait problem.  Skin: Positive for color change and wound.       Left foot wounds  Neurological: Positive for weakness.  Psychiatric/Behavioral: Negative for behavioral problems and confusion.    Vital Signs: BP (!) 172/94 (BP Location: Right Arm)   Pulse 87   Temp 97.7 F (36.5 C) (Oral)   Ht 5\' 2"  (1.575 m)   Wt 115 lb (52.2 kg)   SpO2 97%   BMI 21.03 kg/m   Physical Exam  Constitutional: She is oriented to person, place, and time.  Cardiovascular: Normal rate.   Irregular  Pulmonary/Chest: Effort normal and breath sounds normal.  Abdominal: Soft. Bowel sounds are normal.  Musculoskeletal: Normal range of motion. She exhibits edema and tenderness.  Left foot with wounds at toes and foot  Neurological: She is alert and oriented to person, place, and time.  Skin: Skin is warm and dry. There is erythema.  Left foot  Psychiatric: She has a normal mood and affect. Her behavior is normal. Judgment and thought content normal.  Nursing note and vitals reviewed.   Mallampati Score:  MD Evaluation Airway: WNL Heart: WNL Abdomen: WNL Chest/ Lungs: WNL ASA  Classification: 3 Mallampati/Airway Score: One  Imaging: Ir Angiogram Extremity Left  Result Date: 03/25/2017 INDICATION: 83 year old female with a history of left lower extremity critical limb ischemia and a nonhealing amputation site. Prior ankle brachial index measures in the mild range of arterial occlusive disease, with the segmental exam demonstrating evidence of a distal femoral or popliteal occlusion. CT performed 03/04/2017 confirms  popliteal occlusion with questionable patency of the tibial vessels. She presents today for an angiogram and attempted treatment of the popliteal occlusion. EXAM: ULTRASOUND GUIDED ACCESS OF RIGHT COMMON FEMORAL ARTERY ANGIOGRAM LEFT LOWER EXTREMITY REVASCULARIZATION OF OCCLUDED POPLITEAL ARTERY WITH ANGIOPLASTY AND STENTING. REVASCULARIZATION OF OCCLUDED TIBIOPERONEAL ARTERY AND POSTERIOR TIBIAL ARTERY WITH BALLOON ANGIOPLASTY AND RESTORATION OF IN-LINE FLOW TO THE ANKLE MEDICATIONS: 200 MCG NITROGLYCERIN INTRA-ARTERIAL 8000 UNITS HEPARIN FOR ANTI COAGULATION 5 MG HYDRALAZINE ANESTHESIA/SEDATION: Moderate (conscious) sedation was employed during this procedure. A total of Versed 2.0 mg and Fentanyl 100 mcg was administered intravenously. Moderate Sedation Time: 180 minutes. The patient's level of consciousness and vital signs were monitored continuously by radiology nursing throughout the procedure under my direct supervision. CONTRAST:  200 cc Isovue-300 FLUOROSCOPY TIME:  Fluoroscopy Time: 38 minutes  12 seconds (104.9 mGy). COMPLICATIONS: None PROCEDURE: Informed consent was obtained from the patient following explanation of the procedure, risks, benefits and alternatives. The patient understands, agrees and consents for the procedure. All questions were addressed. A time out was performed prior to the initiation of the procedure. Maximal barrier sterile technique utilized including caps, mask, sterile gowns, sterile gloves, large sterile drape, hand hygiene, and Betadine prep. Ultrasound survey of the right inguinal region was performed with images stored and sent to PACs. A micropuncture needle was used access the right common femoral artery under ultrasound. With excellent arterial blood flow returned, and an .018 micro wire was passed through the needle, observed enter the abdominal aorta under fluoroscopy. The needle was removed, and a micropuncture sheath was placed over the wire. The inner dilator and wire  were removed, and an 035 Bentson wire was advanced under fluoroscopy into the abdominal aorta. The sheath was removed and a standard 5 [place] vascular sheath was placed. The dilator was removed and the sheath was flushed. Omni Flush catheter was advanced over the Bentson wire to the aortic bifurcation. Pelvic angiogram performed. Omni Flush catheter was then used to navigate the Bentson wire into the left external iliac artery. Omni Flush catheter was removed, and 100 cm vertebral catheter was advanced into the external iliac artery. Angiogram of the left lower extremity was performed. Rosen wire was then placed through the vertebral catheter and the 5 French short sheath was exchanged for a 6 [place] braided 65cm Arrow sheath. Sheath was advanced into the proximal superficial femoral artery. Multiple obliquities at the left knee were required in order to visualize the occlusion of the popliteal artery given the left knee arthroplasty hardware. Once adequate angiogram was performed, the proximal occlusion was engaged with the vertebral catheter and an 035 Coons wire. The occlusion was traversed with the vertebral catheter, with the vertebral catheter in the below knee popliteal artery. Wire was removed and a luminal position was confirmed with injection. 014 BMW wire was then advanced through the vertebral catheter and the vertebral catheter was removed. Balloon angioplasty was then performed to a diameter of 4 mm along the length of the abnormal distal superficial femoral artery and the popliteal occlusion into the below knee popliteal artery. A Supera 4 mm x 80 mm was deployed in the popliteal artery across the occluded site, with the stent structure extended proximally with a 5 mm x 80 mm stent. Repeat angiogram was performed. Balloon angioplasty was then performed along the length of the stent site with 5 mm by 60 mm. Combination of angled CXi catheter and 016 Fathom wire were then used to engage the distal  tibioperoneal trunk occlusion and the posterior tibial artery occlusion. This catheter and wire combination were successful with traversing the inclusion. Once the wire was removed and a luminal position was confirmed, the BMW wire was advanced through the CXI catheter into the posterior tibial artery. Balloon angioplasty was performed with 3 mm diameter balloon. Angiogram was performed, confirming patency to the ankle. All catheters and wires were removed. Final angiogram was performed. The sheath was then withdrawn to the right external iliac artery and angiogram was performed. The braided sheath was then exchanged for a short 6 [place] sheath. Given that the ACT greater than 150 at the time of conclusion, sheath remain in place on a pressurized saline bag while heparin was metabolites. The patient tolerated the procedure well and remained hemodynamically stable throughout. No complications were encountered and no significant blood  loss. FINDINGS: Ultrasound survey of the right common femoral artery demonstrates mild calcifications with patent vessel. Angiogram of the pelvic vasculature demonstrates dense calcium with mild atherosclerotic changes bilaterally. Bilateral L4 and L5 lumbar arteries are patent. Bilateral hypogastric arteries are patent with patent pelvic vasculature. No significant stenosis of the right or left external iliac arteries. Angiogram of the left lower extremity demonstrates mild disease of the proximal left superficial femoral artery with patent profunda femoris. 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 The below knee popliteal artery is patent from collateral flow. Anterior tibial artery origin is patent, occluded in the proximal third beyond the origin. Distal tibioperoneal trunk is occluded with occlusion extending into the proximal posterior tibial artery and  peroneal artery. Collateral flow reconstitutes the distal peroneal artery, posterior tibial artery, and anterior tibial artery which remain patent at the ankle. There is moderate disease of the distal posterior tibial artery involving the common plantar, with the lateral plantar artery remain patent. Status post treatment of popliteal occlusion and placement of overlapping stent system proximally into the disease superficial femoral artery, there is excellent flow through the SFA and popliteal artery with no dissection. Continued flow through the proximal anterior tibial artery. Status post treatment of tibioperoneal occlusion and revascularization of the posterior tibial artery there is in-line flow restored through the posterior tibial artery to the ankle. At the conclusion of the case there is palpable posterior tibial pulse on the left. IMPRESSION: Status post left lower extremity angiogram and revascularization of chronic total occlusion of popliteal artery, distal TP trunk, and origin of the posterior tibial artery with restoration of in-line flow to the ankle via the posterior tibial artery after stenting of the popliteal occlusion. Signed, Dulcy Fanny. Earleen Newport DO [facility] Radiology PLAN: The right common femoral artery sheath will stay until with the ACT is below 200. Plan for closure with Exoseal. Gentle hydration IV. Right leg straight until the sheath is out. After the sheath is out the right hip will stay straight for 4 hours. Plan on DC home today. Should the patient have plateau of wound healing, the next target would be left anterior tibial artery in the angiosome, with previous discussion of tibial access and anterior tibial artery revascularization. Electronically Signed   By: Corrie Mckusick D.O.   On: 03/25/2017 17:16   Ir Angiogram Selective Each Additional Vessel  Result Date: 03/25/2017 INDICATION: 83 year old female with a history of left lower  extremity critical limb ischemia and a nonhealing amputation site. Prior ankle brachial index measures in the mild range of arterial occlusive disease, with the segmental exam demonstrating evidence of a distal femoral or popliteal occlusion. CT performed 03/04/2017 confirms popliteal occlusion with questionable patency of the tibial vessels. She presents today for an angiogram and attempted treatment of the popliteal occlusion. EXAM: ULTRASOUND GUIDED ACCESS OF RIGHT COMMON FEMORAL ARTERY ANGIOGRAM LEFT LOWER EXTREMITY REVASCULARIZATION OF OCCLUDED POPLITEAL ARTERY WITH ANGIOPLASTY AND STENTING. REVASCULARIZATION OF OCCLUDED TIBIOPERONEAL ARTERY AND POSTERIOR TIBIAL ARTERY WITH BALLOON ANGIOPLASTY AND RESTORATION OF IN-LINE FLOW TO THE ANKLE MEDICATIONS: 200 MCG NITROGLYCERIN INTRA-ARTERIAL 8000 UNITS HEPARIN FOR ANTI COAGULATION 5 MG HYDRALAZINE ANESTHESIA/SEDATION: Moderate (conscious) sedation was employed during this procedure. A total of Versed 2.0 mg and Fentanyl 100 mcg was administered intravenously. Moderate Sedation Time: 180 minutes. The patient's level of consciousness and vital signs were monitored continuously by radiology  nursing throughout the procedure under my direct supervision. CONTRAST:  200 cc Isovue-300 FLUOROSCOPY TIME:  Fluoroscopy Time: 38 minutes 12 seconds (104.9 mGy). COMPLICATIONS: None PROCEDURE: Informed consent was obtained from the patient following explanation of the procedure, risks, benefits and alternatives. The patient understands, agrees and consents for the procedure. All questions were addressed. A time out was performed prior to the initiation of the procedure. Maximal barrier sterile technique utilized including caps, mask, sterile gowns, sterile gloves, large sterile drape, hand hygiene, and Betadine prep. Ultrasound survey of the right inguinal region was performed with images stored and sent to PACs. A micropuncture needle was used access the right common femoral  artery under ultrasound. With excellent arterial blood flow returned, and an .018 micro wire was passed through the needle, observed enter the abdominal aorta under fluoroscopy. The needle was removed, and a micropuncture sheath was placed over the wire. The inner dilator and wire were removed, and an 035 Bentson wire was advanced under fluoroscopy into the abdominal aorta. The sheath was removed and a standard 5 [place] vascular sheath was placed. The dilator was removed and the sheath was flushed. Omni Flush catheter was advanced over the Bentson wire to the aortic bifurcation. Pelvic angiogram performed. Omni Flush catheter was then used to navigate the Bentson wire into the left external iliac artery. Omni Flush catheter was removed, and 100 cm vertebral catheter was advanced into the external iliac artery. Angiogram of the left lower extremity was performed. Rosen wire was then placed through the vertebral catheter and the 5 French short sheath was exchanged for a 6 [place] braided 65cm Arrow sheath. Sheath was advanced into the proximal superficial femoral artery. Multiple obliquities at the left knee were required in order to visualize the occlusion of the popliteal artery given the left knee arthroplasty hardware. Once adequate angiogram was performed, the proximal occlusion was engaged with the vertebral catheter and an 035 Coons wire. The occlusion was traversed with the vertebral catheter, with the vertebral catheter in the below knee popliteal artery. Wire was removed and a luminal position was confirmed with injection. 014 BMW wire was then advanced through the vertebral catheter and the vertebral catheter was removed. Balloon angioplasty was then performed to a diameter of 4 mm along the length of the abnormal distal superficial femoral artery and the popliteal occlusion into the below knee popliteal artery. A Supera 4 mm x 80 mm was deployed in the popliteal artery across the occluded site, with the  stent structure extended proximally with a 5 mm x 80 mm stent. Repeat angiogram was performed. Balloon angioplasty was then performed along the length of the stent site with 5 mm by 60 mm. Combination of angled CXi catheter and 016 Fathom wire were then used to engage the distal tibioperoneal trunk occlusion and the posterior tibial artery occlusion. This catheter and wire combination were successful with traversing the inclusion. Once the wire was removed and a luminal position was confirmed, the BMW wire was advanced through the CXI catheter into the posterior tibial artery. Balloon angioplasty was performed with 3 mm diameter balloon. Angiogram was performed, confirming patency to the ankle. All catheters and wires were removed. Final angiogram was performed. The sheath was then withdrawn to the right external iliac artery and angiogram was performed. The braided sheath was then exchanged for a short 6 [place] sheath. Given that the ACT greater than 150 at the time of conclusion, sheath remain in place on a pressurized saline bag while heparin was  metabolites. The patient tolerated the procedure well and remained hemodynamically stable throughout. No complications were encountered and no significant blood loss. FINDINGS: Ultrasound survey of the right common femoral artery demonstrates mild calcifications with patent vessel. Angiogram of the pelvic vasculature demonstrates dense calcium with mild atherosclerotic changes bilaterally. Bilateral L4 and L5 lumbar arteries are patent. Bilateral hypogastric arteries are patent with patent pelvic vasculature. No significant stenosis of the right or left external iliac arteries. Angiogram of the left lower extremity demonstrates mild disease of the proximal left superficial femoral artery with patent profunda femoris. 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 The below knee popliteal artery is patent from collateral flow. Anterior tibial artery origin is patent, occluded in the proximal third beyond the origin. Distal tibioperoneal trunk is occluded with occlusion extending into the proximal posterior tibial artery and peroneal artery. Collateral flow reconstitutes the distal peroneal artery, posterior tibial artery, and anterior tibial artery which remain patent at the ankle. There is moderate disease of the distal posterior tibial artery involving the common plantar, with the lateral plantar artery remain patent. Status post treatment of popliteal occlusion and placement of overlapping stent system proximally into the disease superficial femoral artery, there is excellent flow through the SFA and popliteal artery with no dissection. Continued flow through the proximal anterior tibial artery. Status post treatment of tibioperoneal occlusion and revascularization of the posterior tibial artery there is in-line flow restored through the posterior tibial artery to the ankle. At the conclusion of the case there is palpable posterior tibial pulse on the left. IMPRESSION: Status post left lower extremity angiogram and revascularization of chronic total occlusion of popliteal artery, distal TP trunk, and origin of the posterior tibial artery with restoration of in-line flow to the ankle via the posterior tibial artery after stenting of the popliteal occlusion. Signed, Dulcy Fanny. Earleen Newport DO [facility] Radiology PLAN: The right common femoral artery sheath will stay until with the ACT is below 200. Plan for closure with Exoseal. Gentle hydration IV. Right leg straight until the sheath is out. After the sheath is out the right hip will stay straight for 4 hours. Plan on DC home today. Should the patient have plateau of wound healing, the next target would be left anterior tibial artery in the angiosome, with previous  discussion of tibial access and anterior tibial artery revascularization. Electronically Signed   By: Corrie Mckusick D.O.   On: 03/25/2017 17:16   Ir Fem Pop Art Stent Inc Pta Mod Sed  Result Date: 03/25/2017 INDICATION: 83 year old female with a history of left lower extremity critical limb ischemia and a nonhealing amputation site. Prior ankle brachial index measures in the mild range of arterial occlusive disease, with the segmental exam demonstrating evidence of a distal femoral or popliteal occlusion. CT performed 03/04/2017 confirms popliteal occlusion with questionable patency of the tibial vessels. She presents today for an angiogram and attempted treatment of the popliteal occlusion. EXAM: ULTRASOUND GUIDED ACCESS OF RIGHT COMMON FEMORAL ARTERY ANGIOGRAM LEFT LOWER EXTREMITY REVASCULARIZATION OF OCCLUDED POPLITEAL ARTERY WITH ANGIOPLASTY AND STENTING. REVASCULARIZATION OF OCCLUDED TIBIOPERONEAL ARTERY AND POSTERIOR TIBIAL ARTERY WITH BALLOON ANGIOPLASTY AND RESTORATION OF IN-LINE FLOW TO THE ANKLE MEDICATIONS: 200 MCG NITROGLYCERIN INTRA-ARTERIAL 8000 UNITS HEPARIN FOR ANTI COAGULATION 5 MG HYDRALAZINE ANESTHESIA/SEDATION: Moderate (conscious) sedation was employed during this procedure. A total of Versed 2.0 mg and Fentanyl  100 mcg was administered intravenously. Moderate Sedation Time: 180 minutes. The patient's level of consciousness and vital signs were monitored continuously by radiology nursing throughout the procedure under my direct supervision. CONTRAST:  200 cc Isovue-300 FLUOROSCOPY TIME:  Fluoroscopy Time: 38 minutes 12 seconds (104.9 mGy). COMPLICATIONS: None PROCEDURE: Informed consent was obtained from the patient following explanation of the procedure, risks, benefits and alternatives. The patient understands, agrees and consents for the procedure. All questions were addressed. A time out was performed prior to the initiation of the procedure. Maximal barrier sterile technique utilized  including caps, mask, sterile gowns, sterile gloves, large sterile drape, hand hygiene, and Betadine prep. Ultrasound survey of the right inguinal region was performed with images stored and sent to PACs. A micropuncture needle was used access the right common femoral artery under ultrasound. With excellent arterial blood flow returned, and an .018 micro wire was passed through the needle, observed enter the abdominal aorta under fluoroscopy. The needle was removed, and a micropuncture sheath was placed over the wire. The inner dilator and wire were removed, and an 035 Bentson wire was advanced under fluoroscopy into the abdominal aorta. The sheath was removed and a standard 5 [place] vascular sheath was placed. The dilator was removed and the sheath was flushed. Omni Flush catheter was advanced over the Bentson wire to the aortic bifurcation. Pelvic angiogram performed. Omni Flush catheter was then used to navigate the Bentson wire into the left external iliac artery. Omni Flush catheter was removed, and 100 cm vertebral catheter was advanced into the external iliac artery. Angiogram of the left lower extremity was performed. Rosen wire was then placed through the vertebral catheter and the 5 French short sheath was exchanged for a 6 [place] braided 65cm Arrow sheath. Sheath was advanced into the proximal superficial femoral artery. Multiple obliquities at the left knee were required in order to visualize the occlusion of the popliteal artery given the left knee arthroplasty hardware. Once adequate angiogram was performed, the proximal occlusion was engaged with the vertebral catheter and an 035 Coons wire. The occlusion was traversed with the vertebral catheter, with the vertebral catheter in the below knee popliteal artery. Wire was removed and a luminal position was confirmed with injection. 014 BMW wire was then advanced through the vertebral catheter and the vertebral catheter was removed. Balloon angioplasty  was then performed to a diameter of 4 mm along the length of the abnormal distal superficial femoral artery and the popliteal occlusion into the below knee popliteal artery. A Supera 4 mm x 80 mm was deployed in the popliteal artery across the occluded site, with the stent structure extended proximally with a 5 mm x 80 mm stent. Repeat angiogram was performed. Balloon angioplasty was then performed along the length of the stent site with 5 mm by 60 mm. Combination of angled CXi catheter and 016 Fathom wire were then used to engage the distal tibioperoneal trunk occlusion and the posterior tibial artery occlusion. This catheter and wire combination were successful with traversing the inclusion. Once the wire was removed and a luminal position was confirmed, the BMW wire was advanced through the CXI catheter into the posterior tibial artery. Balloon angioplasty was performed with 3 mm diameter balloon. Angiogram was performed, confirming patency to the ankle. All catheters and wires were removed. Final angiogram was performed. The sheath was then withdrawn to the right external iliac artery and angiogram was performed. The braided sheath was then exchanged for a short 6 [place] sheath. Given  that the ACT greater than 150 at the time of conclusion, sheath remain in place on a pressurized saline bag while heparin was metabolites. The patient tolerated the procedure well and remained hemodynamically stable throughout. No complications were encountered and no significant blood loss. FINDINGS: Ultrasound survey of the right common femoral artery demonstrates mild calcifications with patent vessel. Angiogram of the pelvic vasculature demonstrates dense calcium with mild atherosclerotic changes bilaterally. Bilateral L4 and L5 lumbar arteries are patent. Bilateral hypogastric arteries are patent with patent pelvic vasculature. No significant stenosis of the right or left external iliac arteries. Angiogram of the left lower  extremity demonstrates mild disease of the proximal left superficial femoral artery with patent profunda femoris. 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 The below knee popliteal artery is patent from collateral flow. Anterior tibial artery origin is patent, occluded in the proximal third beyond the origin. Distal tibioperoneal trunk is occluded with occlusion extending into the proximal posterior tibial artery and peroneal artery. Collateral flow reconstitutes the distal peroneal artery, posterior tibial artery, and anterior tibial artery which remain patent at the ankle. There is moderate disease of the distal posterior tibial artery involving the common plantar, with the lateral plantar artery remain patent. Status post treatment of popliteal occlusion and placement of overlapping stent system proximally into the disease superficial femoral artery, there is excellent flow through the SFA and popliteal artery with no dissection. Continued flow through the proximal anterior tibial artery. Status post treatment of tibioperoneal occlusion and revascularization of the posterior tibial artery there is in-line flow restored through the posterior tibial artery to the ankle. At the conclusion of the case there is palpable posterior tibial pulse on the left. IMPRESSION: Status post left lower extremity angiogram and revascularization of chronic total occlusion of popliteal artery, distal TP trunk, and origin of the posterior tibial artery with restoration of in-line flow to the ankle via the posterior tibial artery after stenting of the popliteal occlusion. Signed, Dulcy Fanny. Earleen Newport DO [facility] PLAN: The right common femoral artery sheath will stay until with the ACT is below 200. Plan for closure with Exoseal. Gentle hydration IV. Right  leg straight until the sheath is out. After the sheath is out the right hip will stay straight for 4 hours. Plan on DC home today. Should the patient have plateau of wound healing, the next target would be left anterior tibial artery in the angiosome, with previous discussion of tibial access and anterior tibial artery revascularization. Electronically Signed   By: Corrie Mckusick D.O.   On: 03/25/2017 17:16   Ir Tib-pero Art Pta Mod Sed  Result Date: 03/25/2017 INDICATION: 83 year old female with a history of left lower extremity critical limb ischemia and a nonhealing amputation site. Prior ankle brachial index measures in the mild range of arterial occlusive disease, with the segmental exam demonstrating evidence of a distal femoral or popliteal occlusion. CT performed 03/04/2017 confirms popliteal occlusion with questionable patency of the tibial vessels. She presents today for an angiogram and attempted treatment of the popliteal occlusion. EXAM: ULTRASOUND GUIDED ACCESS OF RIGHT COMMON FEMORAL ARTERY ANGIOGRAM LEFT LOWER EXTREMITY REVASCULARIZATION OF OCCLUDED POPLITEAL ARTERY WITH ANGIOPLASTY AND STENTING. REVASCULARIZATION OF OCCLUDED TIBIOPERONEAL ARTERY AND POSTERIOR TIBIAL ARTERY WITH BALLOON ANGIOPLASTY AND RESTORATION OF IN-LINE FLOW TO THE ANKLE MEDICATIONS: 200 MCG NITROGLYCERIN INTRA-ARTERIAL 8000 UNITS HEPARIN FOR ANTI COAGULATION  5 MG HYDRALAZINE ANESTHESIA/SEDATION: Moderate (conscious) sedation was employed during this procedure. A total of Versed 2.0 mg and Fentanyl 100 mcg was administered intravenously. Moderate Sedation Time: 180 minutes. The patient's level of consciousness and vital signs were monitored continuously by radiology nursing throughout the procedure under my direct supervision. CONTRAST:  200 cc Isovue-300 FLUOROSCOPY TIME:  Fluoroscopy Time: 38 minutes 12 seconds (104.9 mGy). COMPLICATIONS: None PROCEDURE: Informed consent was obtained from the patient following explanation of  the procedure, risks, benefits and alternatives. The patient understands, agrees and consents for the procedure. All questions were addressed. A time out was performed prior to the initiation of the procedure. Maximal barrier sterile technique utilized including caps, mask, sterile gowns, sterile gloves, large sterile drape, hand hygiene, and Betadine prep. Ultrasound survey of the right inguinal region was performed with images stored and sent to PACs. A micropuncture needle was used access the right common femoral artery under ultrasound. With excellent arterial blood flow returned, and an .018 micro wire was passed through the needle, observed enter the abdominal aorta under fluoroscopy. The needle was removed, and a micropuncture sheath was placed over the wire. The inner dilator and wire were removed, and an 035 Bentson wire was advanced under fluoroscopy into the abdominal aorta. The sheath was removed and a standard 5 [place] vascular sheath was placed. The dilator was removed and the sheath was flushed. Omni Flush catheter was advanced over the Bentson wire to the aortic bifurcation. Pelvic angiogram performed. Omni Flush catheter was then used to navigate the Bentson wire into the left external iliac artery. Omni Flush catheter was removed, and 100 cm vertebral catheter was advanced into the external iliac artery. Angiogram of the left lower extremity was performed. Rosen wire was then placed through the vertebral catheter and the 5 French short sheath was exchanged for a 6 [place] braided 65cm Arrow sheath. Sheath was advanced into the proximal superficial femoral artery. Multiple obliquities at the left knee were required in order to visualize the occlusion of the popliteal artery given the left knee arthroplasty hardware. Once adequate angiogram was performed, the proximal occlusion was engaged with the vertebral catheter and an 035 Coons wire. The occlusion was traversed with the vertebral catheter, with  the vertebral catheter in the below knee popliteal artery. Wire was removed and a luminal position was confirmed with injection. 014 BMW wire was then advanced through the vertebral catheter and the vertebral catheter was removed. Balloon angioplasty was then performed to a diameter of 4 mm along the length of the abnormal distal superficial femoral artery and the popliteal occlusion into the below knee popliteal artery. A Supera 4 mm x 80 mm was deployed in the popliteal artery across the occluded site, with the stent structure extended proximally with a 5 mm x 80 mm stent. Repeat angiogram was performed. Balloon angioplasty was then performed along the length of the stent site with 5 mm by 60 mm. Combination of angled CXi catheter and 016 Fathom wire were then used to engage the distal tibioperoneal trunk occlusion and the posterior tibial artery occlusion. This catheter and wire combination were successful with traversing the inclusion. Once the wire was removed and a luminal position was confirmed, the BMW wire was advanced through the CXI catheter into the posterior tibial artery. Balloon angioplasty was performed with 3 mm diameter balloon. Angiogram was performed, confirming patency to the ankle. All catheters and wires were removed. Final angiogram was performed. The sheath was then withdrawn to the right  external iliac artery and angiogram was performed. The braided sheath was then exchanged for a short 6 [place] sheath. Given that the ACT greater than 150 at the time of conclusion, sheath remain in place on a pressurized saline bag while heparin was metabolites. The patient tolerated the procedure well and remained hemodynamically stable throughout. No complications were encountered and no significant blood loss. FINDINGS: Ultrasound survey of the right common femoral artery demonstrates mild calcifications with patent vessel. Angiogram of the pelvic vasculature demonstrates dense calcium with mild  atherosclerotic changes bilaterally. Bilateral L4 and L5 lumbar arteries are patent. Bilateral hypogastric arteries are patent with patent pelvic vasculature. No significant stenosis of the right or left external iliac arteries. Angiogram of the left lower extremity demonstrates mild disease of the proximal left superficial femoral artery with patent profunda femoris. 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 The below knee popliteal artery is patent from collateral flow. Anterior tibial artery origin is patent, occluded in the proximal third beyond the origin. Distal tibioperoneal trunk is occluded with occlusion extending into the proximal posterior tibial artery and peroneal artery. Collateral flow reconstitutes the distal peroneal artery, posterior tibial artery, and anterior tibial artery which remain patent at the ankle. There is moderate disease of the distal posterior tibial artery involving the common plantar, with the lateral plantar artery remain patent. Status post treatment of popliteal occlusion and placement of overlapping stent system proximally into the disease superficial femoral artery, there is excellent flow through the SFA and popliteal artery with no dissection. Continued flow through the proximal anterior tibial artery. Status post treatment of tibioperoneal occlusion and revascularization of the posterior tibial artery there is in-line flow restored through the posterior tibial artery to the ankle. At the conclusion of the case there is palpable posterior tibial pulse on the left. IMPRESSION: Status post left lower extremity angiogram and revascularization of chronic total occlusion of popliteal artery, distal TP trunk, and origin of the posterior tibial artery with restoration of in-line flow to the ankle via the posterior tibial artery after stenting of the popliteal  occlusion. Signed, Dulcy Fanny. Earleen Newport DO [facility] Radiology PLAN: The right common femoral artery sheath will stay until with the ACT is below 200. Plan for closure with Exoseal. Gentle hydration IV. Right leg straight until the sheath is out. After the sheath is out the right hip will stay straight for 4 hours. Plan on DC home today. Should the patient have plateau of wound healing, the next target would be left anterior tibial artery in the angiosome, with previous discussion of tibial access and anterior tibial artery revascularization. Electronically Signed   By: Corrie Mckusick D.O.   On: 03/25/2017 17:16   Ir US Guide Vasc Access Right  Result Date: 03/25/2017 INDICATION: 83 year old female with a history of left lower extremity critical limb ischemia and a nonhealing amputation site. Prior ankle brachial index measures in the mild range of arterial occlusive disease, with the segmental exam demonstrating evidence of a distal femoral or popliteal occlusion. CT performed 03/04/2017 confirms popliteal occlusion with questionable patency of the tibial vessels. She presents today for an angiogram and attempted treatment of the popliteal occlusion. EXAM: ULTRASOUND GUIDED ACCESS OF RIGHT COMMON FEMORAL ARTERY ANGIOGRAM LEFT LOWER EXTREMITY REVASCULARIZATION OF OCCLUDED POPLITEAL ARTERY WITH ANGIOPLASTY AND STENTING. REVASCULARIZATION OF OCCLUDED TIBIOPERONEAL ARTERY AND POSTERIOR TIBIAL ARTERY WITH BALLOON  ANGIOPLASTY AND RESTORATION OF IN-LINE FLOW TO THE ANKLE MEDICATIONS: 200 MCG NITROGLYCERIN INTRA-ARTERIAL 8000 UNITS HEPARIN FOR ANTI COAGULATION 5 MG HYDRALAZINE ANESTHESIA/SEDATION: Moderate (conscious) sedation was employed during this procedure. A total of Versed 2.0 mg and Fentanyl 100 mcg was administered intravenously. Moderate Sedation Time: 180 minutes. The patient's level of consciousness and vital signs were monitored continuously by radiology nursing  throughout the procedure under my direct supervision. CONTRAST:  200 cc Isovue-300 FLUOROSCOPY TIME:  Fluoroscopy Time: 38 minutes 12 seconds (104.9 mGy). COMPLICATIONS: None PROCEDURE: Informed consent was obtained from the patient following explanation of the procedure, risks, benefits and alternatives. The patient understands, agrees and consents for the procedure. All questions were addressed. A time out was performed prior to the initiation of the procedure. Maximal barrier sterile technique utilized including caps, mask, sterile gowns, sterile gloves, large sterile drape, hand hygiene, and Betadine prep. Ultrasound survey of the right inguinal region was performed with images stored and sent to PACs. A micropuncture needle was used access the right common femoral artery under ultrasound. With excellent arterial blood flow returned, and an .018 micro wire was passed through the needle, observed enter the abdominal aorta under fluoroscopy. The needle was removed, and a micropuncture sheath was placed over the wire. The inner dilator and wire were removed, and an 035 Bentson wire was advanced under fluoroscopy into the abdominal aorta. The sheath was removed and a standard 5 [place] vascular sheath was placed. The dilator was removed and the sheath was flushed. Omni Flush catheter was advanced over the Bentson wire to the aortic bifurcation. Pelvic angiogram performed. Omni Flush catheter was then used to navigate the Bentson wire into the left external iliac artery. Omni Flush catheter was removed, and 100 cm vertebral catheter was advanced into the external iliac artery. Angiogram of the left lower extremity was performed. Rosen wire was then placed through the vertebral catheter and the 5 French short sheath was exchanged for a 6 [place] braided 65cm Arrow sheath. Sheath was advanced into the proximal superficial femoral artery. Multiple obliquities at the left knee were required in order to visualize the  occlusion of the popliteal artery given the left knee arthroplasty hardware. Once adequate angiogram was performed, the proximal occlusion was engaged with the vertebral catheter and an 035 Coons wire. The occlusion was traversed with the vertebral catheter, with the vertebral catheter in the below knee popliteal artery. Wire was removed and a luminal position was confirmed with injection. 014 BMW wire was then advanced through the vertebral catheter and the vertebral catheter was removed. Balloon angioplasty was then performed to a diameter of 4 mm along the length of the abnormal distal superficial femoral artery and the popliteal occlusion into the below knee popliteal artery. A Supera 4 mm x 80 mm was deployed in the popliteal artery across the occluded site, with the stent structure extended proximally with a 5 mm x 80 mm stent. Repeat angiogram was performed. Balloon angioplasty was then performed along the length of the stent site with 5 mm by 60 mm. Combination of angled CXi catheter and 016 Fathom wire were then used to engage the distal tibioperoneal trunk occlusion and the posterior tibial artery occlusion. This catheter and wire combination were successful with traversing the inclusion. Once the wire was removed and a luminal position was confirmed, the BMW wire was advanced through the CXI catheter into the posterior tibial artery. Balloon angioplasty was performed with 3 mm diameter balloon. Angiogram was performed, confirming patency to  the ankle. All catheters and wires were removed. Final angiogram was performed. The sheath was then withdrawn to the right external iliac artery and angiogram was performed. The braided sheath was then exchanged for a short 6 [place] sheath. Given that the ACT greater than 150 at the time of conclusion, sheath remain in place on a pressurized saline bag while heparin was metabolites. The patient tolerated the procedure well and remained hemodynamically stable  throughout. No complications were encountered and no significant blood loss. FINDINGS: Ultrasound survey of the right common femoral artery demonstrates mild calcifications with patent vessel. Angiogram of the pelvic vasculature demonstrates dense calcium with mild atherosclerotic changes bilaterally. Bilateral L4 and L5 lumbar arteries are patent. Bilateral hypogastric arteries are patent with patent pelvic vasculature. No significant stenosis of the right or left external iliac arteries. Angiogram of the left lower extremity demonstrates mild disease of the proximal left superficial femoral artery with patent profunda femoris. 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 The below knee popliteal artery is patent from collateral flow. Anterior tibial artery origin is patent, occluded in the proximal third beyond the origin. Distal tibioperoneal trunk is occluded with occlusion extending into the proximal posterior tibial artery and peroneal artery. Collateral flow reconstitutes the distal peroneal artery, posterior tibial artery, and anterior tibial artery which remain patent at the ankle. There is moderate disease of the distal posterior tibial artery involving the common plantar, with the lateral plantar artery remain patent. Status post treatment of popliteal occlusion and placement of overlapping stent system proximally into the disease superficial femoral artery, there is excellent flow through the SFA and popliteal artery with no dissection. Continued flow through the proximal anterior tibial artery. Status post treatment of tibioperoneal occlusion and revascularization of the posterior tibial artery there is in-line flow restored through the posterior tibial artery to the ankle. At the conclusion of the case there is palpable posterior tibial pulse on the left. IMPRESSION: Status post left  lower extremity angiogram and revascularization of chronic total occlusion of popliteal artery, distal TP trunk, and origin of the posterior tibial artery with restoration of in-line flow to the ankle via the posterior tibial artery after stenting of the popliteal occlusion. Signed, Dulcy Fanny. Earleen Newport DO [facility] PLAN: The right common femoral artery sheath will stay until with the ACT is below 200. Plan for closure with Exoseal. Gentle hydration IV. Right leg straight until the sheath is out. After the sheath is out the right hip will stay straight for 4 hours. Plan on DC home today. Should the patient have plateau of wound healing, the next target would be left anterior tibial artery in the angiosome, with previous discussion of tibial access and anterior tibial artery revascularization. Electronically Signed   By: Corrie Mckusick D.O.   On: 03/25/2017 17:16    Labs:  CBC:  Recent Labs  02/13/17 2224 02/13/17 2236 03/18/17 1138 03/25/17 0645 04/07/17 0850  WBC 8.8  --  11.9* 12.7* 10.7*  HGB 16.3* 16.3* 15.2* 15.6* 14.7  HCT 48.5* 48.0* 45.3 45.7 45.0  PLT 170  --  215 329 322    COAGS:  Recent Labs  01/01/17 02/04/17 0935 03/25/17 0645 04/07/17 0850  INR 1.2* 1.1 1.16 1.15  APTT  --   --  32  --     BMP:  Recent Labs  02/13/17 2236  03/18/17 1138 03/25/17 0645 04/07/17 0850  NA 141 128* 137 138  K 3.7 4.5 4.3 3.6  CL 101 97* 100* 101  CO2  --  21* 27 25  GLUCOSE 296* 234* 214* 314*  BUN 24* 27* 21* 16  CALCIUM  --  9.5 9.4 9.0  CREATININE 0.80 1.23* 0.84 0.83  GFRNONAA  --  39* >60 >60  GFRAA  --  45* >60 >60    LIVER FUNCTION TESTS:  Recent Labs  03/18/17 1138  BILITOT 1.5*  AST 23  ALT 24  ALKPHOS 77  PROT 6.8  ALBUMIN 3.5    TUMOR MARKERS: No results for input(s): AFPTM, CEA, CA199, CHROMGRNA in the last 8760 hours.  Assessment and Plan:  Left low leg arterial occlusion Previous  revascularization -- symptom recurrence and wounds slow to heal Scheduled now for left low extremity arteriogram with possible revascularization intervention Risks and Benefits discussed with the patient including, but not limited to bleeding, infection, vascular injury or contrast induced renal failure. All of the patient's questions were answered, patient is agreeable to proceed. Consent signed and in chart.  Thank you for this interesting consult.  I greatly enjoyed meeting [name] and look forward to participating in their care.  A copy of this report was sent to the requesting provider on this date.  Electronically Signed: Lavonia Drafts, PA-C 04/08/2017, 7:21 AM   I spent a total of    25 Minutes in face to face in clinical consultation, greater than 50% of which was counseling/coordinating care for left low leg revascularization/intervention

## 2017-04-08 NOTE — Progress Notes (Signed)
Orthopedic Tech Progress Note Patient Details:  [name] [birth year] [id number]  Ortho Devices Type of Ortho Device: Knee Immobilizer Ortho Device/Splint Location: bilateral Ortho Device/Splint Interventions: Application   Allenmichael Mcpartlin 04/08/2017, 2:41 PM As ordered by Dr. Jacqualyn Posey

## 2017-04-08 NOTE — Progress Notes (Signed)
Per MD verbal order check pulses Q2, HR up from 80s to low 110s-120 (chronic afib)-MD aware-MD to place orders for PRN metoprolol.  Rowe Pavy, RN

## 2017-04-08 NOTE — Progress Notes (Signed)
Patient attempted to void several times on bedpan, bladder scan showed 611, per MD verbal order place foley for acute urinary retention.  Rowe Pavy, RN

## 2017-04-08 NOTE — Progress Notes (Signed)
Interventional Radiology Progress Note  83 year old female admitted for treatment of left leg acute limb ischemia.  Fem/pop occlusion after treatment 03/25/2017.   Lysis initiated today from right CFA approach.   At baseline, patient has category 2a ALI, with intact motor/sensory, with no arterial pulses.  Venous signal maintained.    Post op check at 4pm.  Patient states that left foot pain is relatively unchanged, last recorded 6/10 intensity.  PRN IV pain medicine.  Motor maintained.   Left foot cool to touch at ankle and foot.  No doppler signal at the PT or the AT.   Continue overnight lysis.    NPO past midnight for repeat angio in the am.   Call with questions/concerns.   Signed,  Dulcy Fanny. Earleen Newport, DO

## 2017-04-08 NOTE — Procedures (Signed)
Interventional Radiology Procedure Note  Procedure: US guided right CFA access, with 31F 45cm sheath placement.  Angiogram left LE.  Placement of 135cm/40cm Unifuse lysis catheter through sheath, terminating in the proximal PT artery.   Initiation of arterial lysis with TNK, 0.5mg /hr (volume 50cc/hr) through unifuse.   Findings: Acute thrombosis of the SFA/pop stent system, from mid thigh through the stent.   Distal pop is patent via collaterals, with filling of the proximal AT (occluded after first 1/3, same as prior), and occlusion of the peroneal artery (same as prior).  New acute thrombosis of the PT.   Complications: None  Recommendations:  - Admit to ICU status for observation overnight.  - Sheath will accept low dose heparinized saline @ 20cc/hr - Unifuse catheter will accept TNK @ 0.5mg /hr (50cc/hr) - Total volume of 70cc/hr + the low dose heparin volume per pharmacy - Baseline fibrinogen now, with Q6hr fibrinogen.  Call IR with value less than 150 - Heparin level per pharmacy - Neurovascular checks overnight - Call IR with new severe headache or acute mental status changes - Frequent neurovascular checks - Bilateral hips and left knee straight overnight with strict bedrest (catheter/s cross 3 joints) - Clear liquids until midnight - NPO past midnight for repeat in the am   Signed,  Dulcy Fanny. Earleen Newport, DO

## 2017-04-08 NOTE — Progress Notes (Signed)
[place] for Heparin IV in combination with TNKase IA Indication: VTE treatment   Allergies  Allergen Reactions  . Ceftriaxone Sodium Other (See Comments)    Tingly feeling after given, then one hour later while vanco infusing pruritic rash  . Phenergan [Promethazine Hcl]     Hallucinations  . Sulfonamide Derivatives Hives  . Vancomycin Hcl     Rash happened 1hr after rocephin but immediately after infusing vancomycin    Patient Measurements: Height: 5\' 2"  (157.5 cm) Weight: 115 lb (52.2 kg) IBW/kg (Calculated) : 50.1 Heparin Dosing Weight: 52 kg  Vital Signs: Temp: 97.3 F (36.3 C) (05/22 1644) Temp Source: Oral (05/22 1644) BP: 134/100 (05/22 1800) Pulse Rate: 116 (05/22 1815)  Labs:  Recent Labs  04/07/17 0850 04/08/17 0701 04/08/17 1037  HGB 14.7  --  14.8  HCT 45.0  --  45.3  PLT 322  --  269  LABPROT 14.7 15.2  --   INR 1.15 1.19  --   HEPARINUNFRC  --   --  0.30  CREATININE 0.83  --   --     Estimated Creatinine Clearance: 38.5 mL/min (by C-G formula based on SCr of 0.83 mg/dL).   Medical History: Past Medical History:  Diagnosis Date  . Anemia   . Arthritis   . Dysrhythmia    hx brief AF post op hip surg  . GERD (gastroesophageal reflux disease)   . Hypertension     Medications:  Prescriptions Prior to Admission  Medication Sig Dispense Refill Last Dose  . acetaminophen (TYLENOL) 325 MG tablet Take 650 mg by mouth daily.    04/07/2017 at Unknown time  . aspirin EC 325 MG tablet Take 325 mg by mouth at bedtime.   04/07/2017 at Unknown time  . calcium carbonate (OSCAL) 1500 (600 Ca) MG TABS tablet Take 600 mg of elemental calcium by mouth daily with breakfast.   04/07/2017 at Unknown time  . ferrous sulfate 325 (65 FE) MG tablet Take 325 mg by mouth daily with breakfast.   04/07/2017 at Unknown time  . metoprolol succinate (TOPROL-XL) 25 MG 24 hr tablet Take 25 mg by mouth at bedtime.    04/07/2017 at Unknown  time  . vitamin B-12 (CYANOCOBALAMIN) 1000 MCG tablet Take 1,000 mcg by mouth daily.   04/07/2017 at Unknown time  . Vitamin D, Ergocalciferol, (DRISDOL) 50000 units CAPS capsule Take 50,000 Units by mouth every 7 (seven) days. Sundays   04/07/2017 at Unknown time  . warfarin (COUMADIN) 2.5 MG tablet Take 1.25 mg by mouth daily at 6 PM.    04/02/2017 at 2000   Scheduled:  . sodium chloride flush  3 mL Intravenous Q12H   Infusions:  . sodium chloride    . heparin 800 Units/hr (04/08/17 1800)  . niCARDipine 3 mg/hr (04/08/17 1800)  . sodium chloride 0.9 % 500 mL with heparin 1,000 Units infusion 20 mL/hr at 04/08/17 1800  . tenecteplase (TNKase) infusion 0.5 mg/hr (04/08/17 1800)   PRN: sodium chloride, acetaminophen, HYDROmorphone (DILAUDID) injection, metoprolol tartrate, ondansetron (ZOFRAN) IV, sodium chloride flush  Assessment: Patient is a 83 yo female admitted 5/22 for IR TKAse with concomitant systemic heparin per pharmacy. Patient was on warfarin PTA for AFib, with last dose on 5/16 and admission INR of 1.19.   Baseline CBC shows hemoglobin and platelet count within normal limits. No bleeding noted. Heparin was initiated at 800 units/hr per MD.   Initial heparin level at goal at 0.3.  Goal of Therapy:  Heparin level 0.2-0.5 units/ml Monitor platelets by anticoagulation protocol: Yes   Plan:  Continue heparin 800 units/hr IV. Follow up 6-hour level Heparin level, CBC, and fibrinogen every 6 hours x24 hours Monitor signs/symptoms of bleeding Follow up transition back to oral anticoagulation post-procedure  Uvaldo Rising, BCPS  Clinical Pharmacist Pager [phone]  04/08/2017 6:44 PM

## 2017-04-08 NOTE — Progress Notes (Signed)
[facility]  Patient Status: Active (receiving services up to time of hospitalization)  [facility] is providing the following services: RN and PT  If patient discharges after hours, please call [phone].   [name] 04/08/2017, 1:46 PM

## 2017-04-09 ENCOUNTER — Observation Stay (HOSPITAL_COMMUNITY): Payer: Medicare Other

## 2017-04-09 ENCOUNTER — Encounter (HOSPITAL_COMMUNITY): Payer: Self-pay | Admitting: Interventional Radiology

## 2017-04-09 DIAGNOSIS — I743 Embolism and thrombosis of arteries of the lower extremities: Secondary | ICD-10-CM | POA: Diagnosis not present

## 2017-04-09 DIAGNOSIS — I739 Peripheral vascular disease, unspecified: Secondary | ICD-10-CM | POA: Diagnosis not present

## 2017-04-09 DIAGNOSIS — Z9889 Other specified postprocedural states: Secondary | ICD-10-CM | POA: Diagnosis not present

## 2017-04-09 DIAGNOSIS — I998 Other disorder of circulatory system: Secondary | ICD-10-CM | POA: Diagnosis not present

## 2017-04-09 HISTORY — PX: IR THROMB F/U EVAL ART/VEN FINAL DAY (MS): IMG5379

## 2017-04-09 LAB — CBC
HCT: 39 % (ref 36.0–46.0)
HEMATOCRIT: 40.4 % (ref 36.0–46.0)
HEMOGLOBIN: 13.4 g/dL (ref 12.0–15.0)
Hemoglobin: 13.1 g/dL (ref 12.0–15.0)
MCH: 31.5 pg (ref 26.0–34.0)
MCH: 32.3 pg (ref 26.0–34.0)
MCHC: 33.2 g/dL (ref 30.0–36.0)
MCHC: 33.6 g/dL (ref 30.0–36.0)
MCV: 95.1 fL (ref 78.0–100.0)
MCV: 96.1 fL (ref 78.0–100.0)
PLATELETS: 235 10*3/uL (ref 150–400)
Platelets: 218 10*3/uL (ref 150–400)
RBC: 4.25 MIL/uL (ref 3.87–5.11)
RBC: 4.06 MIL/uL (ref 3.87–5.11)
RDW: 13.2 % (ref 11.5–15.5)
RDW: 13.3 % (ref 11.5–15.5)
WBC: 15.4 10*3/uL — AB (ref 4.0–10.5)
WBC: 13.8 10*3/uL — ABNORMAL HIGH (ref 4.0–10.5)

## 2017-04-09 LAB — HEPARIN LEVEL (UNFRACTIONATED)
HEPARIN UNFRACTIONATED: 0.24 [IU]/mL — AB (ref 0.30–0.70)
Heparin Unfractionated: 0.34 IU/mL (ref 0.30–0.70)
Heparin Unfractionated: <0.1 IU/mL — ABNORMAL LOW (ref 0.30–0.70)

## 2017-04-09 LAB — PROTIME-INR
INR: 1.36
Prothrombin Time: 16.9 seconds — ABNORMAL HIGH (ref 11.4–15.2)

## 2017-04-09 LAB — FIBRINOGEN
FIBRINOGEN: 198 mg/dL — AB (ref 210–475)
FIBRINOGEN: 314 mg/dL (ref 210–475)

## 2017-04-09 LAB — GLUCOSE, CAPILLARY: Glucose-Capillary: 158 mg/dL — ABNORMAL HIGH (ref 65–99)

## 2017-04-09 MED ORDER — WARFARIN SODIUM 3 MG PO TABS: 3.0000 mg | ORAL_TABLET | Freq: Once | ORAL | Status: DC

## 2017-04-09 MED ORDER — WARFARIN SODIUM 2 MG PO TABS: 2.0000 mg | ORAL_TABLET | Freq: Once | ORAL | Status: DC

## 2017-04-09 MED ORDER — WARFARIN - PHARMACIST DOSING INPATIENT
Freq: Every day | Status: DC
Start: 2017-04-09 — End: 2017-04-10
  Administered 2017-04-09: 18:00:00

## 2017-04-09 MED ORDER — CLOPIDOGREL BISULFATE 300 MG PO TABS
300.0000 mg | ORAL_TABLET | Freq: Once | ORAL | Status: AC
Start: 2017-04-09 — End: 2017-04-09
  Administered 2017-04-09: 300 mg via ORAL
  Filled 2017-04-09: qty 1

## 2017-04-09 MED ORDER — FENTANYL CITRATE (PF) 100 MCG/2ML IJ SOLN
INTRAMUSCULAR | Status: AC
  Filled 2017-04-09: qty 2

## 2017-04-09 MED ORDER — HYDROMORPHONE HCL 1 MG/ML IJ SOLN
INTRAMUSCULAR | Status: AC
  Filled 2017-04-09: qty 1

## 2017-04-09 MED ORDER — CLOPIDOGREL BISULFATE 75 MG PO TABS
75.0000 mg | ORAL_TABLET | Freq: Every day | ORAL | Status: DC
  Administered 2017-04-10: 75 mg via ORAL
  Filled 2017-04-09: qty 1

## 2017-04-09 MED ORDER — WARFARIN SODIUM 3 MG PO TABS
3.0000 mg | ORAL_TABLET | Freq: Once | ORAL | Status: AC
  Administered 2017-04-09: 3 mg via ORAL
  Filled 2017-04-09: qty 1

## 2017-04-09 MED ORDER — IOPAMIDOL (ISOVUE-300) INJECTION 61%
INTRAVENOUS | Status: AC
  Administered 2017-04-09: 60 mL
  Filled 2017-04-09: qty 100

## 2017-04-09 MED ORDER — HEPARIN (PORCINE) IN NACL 100-0.45 UNIT/ML-% IJ SOLN
950.0000 [IU]/h | INTRAMUSCULAR | Status: DC
  Administered 2017-04-09 (×2): 800 [IU]/h via INTRAVENOUS
  Filled 2017-04-09: qty 250

## 2017-04-09 MED ORDER — MIDAZOLAM HCL 2 MG/2ML IJ SOLN
INTRAMUSCULAR | Status: AC
  Filled 2017-04-09: qty 2

## 2017-04-09 NOTE — Progress Notes (Signed)
[place] for Heparin IV in combination with TNKase IA Indication: VTE treatment   Allergies  Allergen Reactions  . Ceftriaxone Sodium Other (See Comments)    Tingly feeling after given, then one hour later while vanco infusing pruritic rash  . Phenergan [Promethazine Hcl]     Hallucinations  . Sulfonamide Derivatives Hives  . Vancomycin Hcl     Rash happened 1hr after rocephin but immediately after infusing vancomycin    Patient Measurements: Height: 5\' 2"  (157.5 cm) Weight: 115 lb (52.2 kg) IBW/kg (Calculated) : 50.1 Heparin Dosing Weight: 52 kg  Vital Signs: Temp: 97.8 F (36.6 C) (05/23 0804) Temp Source: Oral (05/23 0804) BP: 123/69 (05/23 0700) Pulse Rate: 85 (05/23 0700)  Labs:  Recent Labs  04/07/17 0850 04/08/17 0701 04/08/17 1037 04/09/17 0127 04/09/17 0128 04/09/17 0552  HGB 14.7  --  14.8 13.4  --  13.1  HCT 45.0  --  45.3 40.4  --  39.0  PLT 322  --  269 218  --  235  LABPROT 14.7 15.2  --   --   --  16.9*  INR 1.15 1.19  --   --   --  1.36  HEPARINUNFRC  --   --  0.30  --  0.34 0.24*  CREATININE 0.83  --   --   --   --   --     Estimated Creatinine Clearance: 38.5 mL/min (by C-G formula based on SCr of 0.83 mg/dL).   Medical History: Past Medical History:  Diagnosis Date  . Anemia   . Arthritis   . Dysrhythmia    hx brief AF post op hip surg  . GERD (gastroesophageal reflux disease)   . Hypertension     Medications:  Prescriptions Prior to Admission  Medication Sig Dispense Refill Last Dose  . acetaminophen (TYLENOL) 325 MG tablet Take 650 mg by mouth daily.    04/07/2017 at Unknown time  . aspirin EC 325 MG tablet Take 325 mg by mouth at bedtime.   04/07/2017 at Unknown time  . calcium carbonate (OSCAL) 1500 (600 Ca) MG TABS tablet Take 600 mg of elemental calcium by mouth daily with breakfast.   04/07/2017 at Unknown time  . ferrous sulfate 325 (65 FE) MG tablet Take 325 mg by mouth daily with  breakfast.   04/07/2017 at Unknown time  . metoprolol succinate (TOPROL-XL) 25 MG 24 hr tablet Take 25 mg by mouth at bedtime.    04/07/2017 at Unknown time  . vitamin B-12 (CYANOCOBALAMIN) 1000 MCG tablet Take 1,000 mcg by mouth daily.   04/07/2017 at Unknown time  . Vitamin D, Ergocalciferol, (DRISDOL) 50000 units CAPS capsule Take 50,000 Units by mouth every 7 (seven) days. Sundays   04/07/2017 at Unknown time  . warfarin (COUMADIN) 2.5 MG tablet Take 1.25 mg by mouth daily at 6 PM.    04/02/2017 at 2000   Scheduled:  . mouth rinse  15 mL Mouth Rinse BID  . sodium chloride flush  3 mL Intravenous Q12H   Infusions:  . sodium chloride    . heparin 800 Units/hr (04/09/17 0700)  . niCARDipine Stopped (04/08/17 2100)  . sodium chloride 0.9 % 500 mL with heparin 1,000 Units infusion 20 mL/hr at 04/09/17 0700  . tenecteplase (TNKase) infusion 0.5 mg/hr (04/09/17 0700)   PRN: sodium chloride, acetaminophen, HYDROmorphone (DILAUDID) injection, metoprolol tartrate, ondansetron (ZOFRAN) IV, sodium chloride flush  Assessment: Patient is a 83 yo female admitted 5/22  for IR TKAse with concomitant systemic heparin per pharmacy. Patient was on warfarin PTA for AFib, with last dose on 5/16 and admission INR of 1.19.   Hemoglobin and platelet counts have remained stable with no notes of bleeding. Heparin levels have been therapeutic at 800 units/hr IV. Last fibrinogen level decreased to 198, with cutoff being 150 per radiology.  5/22 1030 HL: not on heparin  5/22 1700 HL: 0.3 (under 1030 time in Epic, but was 1700 level) 5/23 0130 HL: 0.34 5/24 0600 HL: 0.24   Goal of Therapy:  Heparin level 0.2-0.5 units/ml Monitor platelets by anticoagulation protocol: Yes   Plan:  Continue heparin 800 units/hr IV. Follow up next level at 1200 Heparin level, CBC, and fibrinogen every 6 hours x24 hours Monitor signs/symptoms of bleeding Follow up transition back to oral anticoagulation post-procedure  Demetrius Charity, PharmD Acute Care Pharmacy Resident  Pager: [phone] 04/09/2017

## 2017-04-09 NOTE — Progress Notes (Signed)
Sheath removed by IR. Upon assessment of groin and pulses, no pulses dopplerable on the right foot. Paged MD awaiting new orders. Will continue to assess and monitor the pt closely.

## 2017-04-09 NOTE — Progress Notes (Addendum)
*  PRELIMINARY RESULTS* Vascular Ultrasound Right lower extremity arterial duplex has been completed.  Preliminary findings:    Waveform within normal limits noted in the right femoral artery. Waveform dampens to monophasic in the popliteal artery, and more dampened distally to the posterior tibial, anterior tibial, and peroneal arteries. Etiology unknown as no obvious areas of stenosis or occlusion.    ARTERIAL RIGHT WAVEFORMS     FA mid Triphasic  FA distal Triphasic  POP A prox Monophasic  POP A distal Monophasic  PERO A Monophasic  ATA Damp Monophasic  PTA Monophasic   Cannot image CFA and prox FA due to bandages. Cannot clearly visualize popliteal artery due to patient limitations to bending knee.    Landry Mellow, RDMS, RVT  04/09/2017, 6:36 PM

## 2017-04-09 NOTE — Progress Notes (Addendum)
ANTICOAGULATION CONSULT NOTE   Pharmacy Consult for Heparin Bridge to Warfarin  Indication: A Fib s/p thrombolysis for limb ischemia  Allergies  Allergen Reactions  . Ceftriaxone Sodium Other (See Comments)    Tingly feeling after given, then one hour later while vanco infusing pruritic rash  . Phenergan [Promethazine Hcl]     Hallucinations  . Sulfonamide Derivatives Hives  . Vancomycin Hcl     Rash happened 1hr after rocephin but immediately after infusing vancomycin    Patient Measurements: Height: 5\' 2"  (157.5 cm) Weight: 115 lb (52.2 kg) IBW/kg (Calculated) : 50.1 Heparin Dosing Weight: 52 kg  Vital Signs: Temp: 97.8 F (36.6 C) (05/23 0804) Temp Source: Oral (05/23 0804) BP: 163/95 (05/23 1327) Pulse Rate: 123 (05/23 1327)  Labs:  Recent Labs  04/07/17 0850 04/08/17 0701 04/08/17 1037 04/09/17 0127 04/09/17 0128 04/09/17 0552  HGB 14.7  --  14.8 13.4  --  13.1  HCT 45.0  --  45.3 40.4  --  39.0  PLT 322  --  269 218  --  235  LABPROT 14.7 15.2  --   --   --  16.9*  INR 1.15 1.19  --   --   --  1.36  HEPARINUNFRC  --   --  0.30  --  0.34 0.24*  CREATININE 0.83  --   --   --   --   --     Estimated Creatinine Clearance: 38.5 mL/min (by C-G formula based on SCr of 0.83 mg/dL).   Medical History: Past Medical History:  Diagnosis Date  . Anemia   . Arthritis   . Dysrhythmia    hx brief AF post op hip surg  . GERD (gastroesophageal reflux disease)   . Hypertension     Medications:  Prescriptions Prior to Admission  Medication Sig Dispense Refill Last Dose  . acetaminophen (TYLENOL) 325 MG tablet Take 650 mg by mouth daily.    04/07/2017 at Unknown time  . aspirin EC 325 MG tablet Take 325 mg by mouth at bedtime.   04/07/2017 at Unknown time  . calcium carbonate (OSCAL) 1500 (600 Ca) MG TABS tablet Take 600 mg of elemental calcium by mouth daily with breakfast.   04/07/2017 at Unknown time  . ferrous sulfate 325 (65 FE) MG tablet Take 325 mg by mouth  daily with breakfast.   04/07/2017 at Unknown time  . metoprolol succinate (TOPROL-XL) 25 MG 24 hr tablet Take 25 mg by mouth at bedtime.    04/07/2017 at Unknown time  . vitamin B-12 (CYANOCOBALAMIN) 1000 MCG tablet Take 1,000 mcg by mouth daily.   04/07/2017 at Unknown time  . Vitamin D, Ergocalciferol, (DRISDOL) 50000 units CAPS capsule Take 50,000 Units by mouth every 7 (seven) days. Sundays   04/07/2017 at Unknown time  . warfarin (COUMADIN) 2.5 MG tablet Take 1.25 mg by mouth daily at 6 PM.    04/02/2017 at 2000   Scheduled:  . clopidogrel  300 mg Oral Once  . [START ON 04/10/2017] clopidogrel  75 mg Oral Daily  . HYDROmorphone      . mouth rinse  15 mL Mouth Rinse BID  . sodium chloride flush  3 mL Intravenous Q12H   Infusions:  . sodium chloride    . heparin 800 Units/hr (04/09/17 0700)  . niCARDipine Stopped (04/08/17 2100)  . sodium chloride 0.9 % 500 mL with heparin 1,000 Units infusion 20 mL/hr at 04/09/17 1233  . tenecteplase (TNKase) infusion 0.5 mg/hr (04/09/17  0700)   PRN: sodium chloride, acetaminophen, HYDROmorphone (DILAUDID) injection, metoprolol tartrate, ondansetron (ZOFRAN) IV, sodium chloride flush  Assessment: Patient is a 83 yo female admitted 5/22 for IR TKAse with concomitant systemic heparin per pharmacy. Patient was on warfarin PTA for AFib, with last dose on 5/16 and admission INR of 1.19.   Hemoglobin and platelet counts have remained stable with bruising on arm noted. Heparin levels have been therapeutic at 800 units/hr IV. Plan to continue until INR is >2. Keep lower HL goal for 24 hours post-procedure.   INR today is subtherapeutic today at 1.36 as expected after holding for thrombolysis procedure. PTA warfarin dose is 2.5mg  daily per patient discussion. Noted that patient is receiving clopidogrel 300mg  x1 tonight, followed by 75mg  daily.   Goal of Therapy:  Heparin level 0.2-0.5 units/ml  INR 2-3 Monitor platelets by anticoagulation protocol: Yes    Plan:  Warfarin 3mg  PO x1 tonight  Restart heparin 800 units/hr IV at 1830 (2 hours post-sheath removal at 1630 if groin looks okay) and continue until INR >2 F/U Q6H HL post-IR Daily heparin level, CBC, INR Monitor signs/symptoms of bleeding  Demetrius Charity, PharmD Acute Care Pharmacy Resident  Pager: [phone] 04/09/2017

## 2017-04-09 NOTE — Progress Notes (Signed)
Vascular arterial duplex ordered per Dr. Jarvis Newcomer. Notified MD of procedure being complete orders to restart heparin and hold coumadin this evening. Will continue to assess and monitor the pt closely.

## 2017-04-09 NOTE — Progress Notes (Signed)
ANTICOAGULATION CONSULT NOTE - Follow Up Consult  Pharmacy Consult for heparin Indication: VTE tx w/ concomitant TNKase  Labs:  Recent Labs  04/07/17 0850 04/08/17 0701 04/08/17 1037 04/09/17 0127 04/09/17 0128  HGB 14.7  --  14.8 13.4  --   HCT 45.0  --  45.3 40.4  --   PLT 322  --  269 218  --   LABPROT 14.7 15.2  --   --   --   INR 1.15 1.19  --   --   --   HEPARINUNFRC  --   --  0.30  --  0.34  CREATININE 0.83  --   --   --   --     Assessment/Plan:  83yo female remains therapeutic on heparin while on IA TNKase. Will continue gtt at current rate and monitor Q6H labs.   Wynona Neat, PharmD, BCPS  04/09/2017,2:21 AM

## 2017-04-09 NOTE — Progress Notes (Signed)
Upon arrival to IR for procedure pt has large bruise to right antecubital area on arm. Bruise is hard to touch and warm. Blood pressure cuff taken off of this arm and moved to left arm. Pt states that right antecubital area is sore to touch. Cold compress applied to right arm. Pt family at bedside.

## 2017-04-09 NOTE — Progress Notes (Signed)
Hematoma noted prior to removing sheath.  3fr 45 cm sheath pulled from right groin at 1638 hrs.  Pressure held using V-pad. Hemostasis achieved at 1702 hrs.  Right groin site reviewed with pt's RN Katharine Look.    Celene Kras Rt-R [employer] Rt[employer]R [employer] Rt-R

## 2017-04-09 NOTE — Care Management Note (Signed)
Case Management Note Marvetta Gibbons RN, BSN Unit 2W-Case Manager-- [place] coverage [phone]  Patient Details  Name: [name] MRN: [id number] Date of Birth: February 28, 1928  Subjective/Objective:  Pt admitted with Left leg acute limb ischemia, lysis in process                  Action/Plan:  PTA pt lived at home with spouse- CM to follow for d/c needs  Expected Discharge Date:                  Expected Discharge Plan:     In-House Referral:     Discharge planning Services  CM Consult  Post Acute Care Choice:    Choice offered to:     DME Arranged:    DME Agency:     HH Arranged:    HH Agency:     Status of Service:  In process, will continue to follow  If discussed at Long Length of Stay Meetings, dates discussed:    Additional Comments:  Dawayne Patricia, RN 04/09/2017, 10:55 AM

## 2017-04-09 NOTE — Procedures (Signed)
Follow-up LLE arteriogram  Restoration of LLE outflow with PT runoff to ankle, reconstituted distal AT/DP and peroneal flow  No complication No blood loss. See complete dictation in [facility].   Plan D/C lysis and remove catheter Hold heparin 2 hours Remove sheath in 2 hours Restart heparin 4 hours later, and start plavix, if groin stable  Dr. Earleen Newport to see tomorrow

## 2017-04-09 NOTE — Progress Notes (Signed)
The patient has flow to her LLE.  Her lysis has been completed and her catheter removed.  She will continue on her heparin drip and we will restart her coumadin today.  Once her INR is therapeutic above 2, then we can DC the heparin and let the patient go home.  She will also be given a loading dose of Plavix 300mg  tonight and will start daily 75mg  plavix tomorrow.  We will evaluate the patient in the morning and follow her INR to determine appropriate time for discharge.  Timiko Offutt E 2:31 PM 04/09/2017

## 2017-04-09 NOTE — Progress Notes (Signed)
Patient ID: [name], female   DOB: Feb 02, 1928, 83 y.o.   MRN: [id number]     Supervising Physician: Arne Cleveland  Patient Status: [facility] - In-pt  Chief Complaint: Left leg acute limb ischemia  Subjective: Patient is tired of laying flat.  She denies any pain related to her ischemia in her left leg.    Allergies: Ceftriaxone sodium; Phenergan [promethazine hcl]; Sulfonamide derivatives; and Vancomycin hcl  Medications: Prior to Admission medications   Medication Sig Start Date End Date Taking? Authorizing Provider  acetaminophen (TYLENOL) 325 MG tablet Take 650 mg by mouth daily.    Yes [provider]  aspirin EC 325 MG tablet Take 325 mg by mouth at bedtime.   Yes [provider]  calcium carbonate (OSCAL) 1500 (600 Ca) MG TABS tablet Take 600 mg of elemental calcium by mouth daily with breakfast.   Yes [provider]  ferrous sulfate 325 (65 FE) MG tablet Take 325 mg by mouth daily with breakfast.   Yes [provider]  metoprolol succinate (TOPROL-XL) 25 MG 24 hr tablet Take 25 mg by mouth at bedtime.    Yes [provider]  vitamin B-12 (CYANOCOBALAMIN) 1000 MCG tablet Take 1,000 mcg by mouth daily.   Yes [provider]  Vitamin D, Ergocalciferol, (DRISDOL) 50000 units CAPS capsule Take 50,000 Units by mouth every 7 (seven) days. Sundays   Yes [provider]  warfarin (COUMADIN) 2.5 MG tablet Take 1.25 mg by mouth daily at 6 PM.     [provider]    Vital Signs: BP 123/69   Pulse 85   Temp 97.8 F (36.6 C) (Oral)   Resp 13   Ht 5\' 2"  (1.575 m)   Wt 115 lb (52.2 kg)   SpO2 95%   BMI 21.03 kg/m   Physical Exam: Ext: LLE still with some purple, hyperemic changes, but her foot is warm and she has a dopplerable pedal pulse.  Right groin sheath in place with minimal drainage present.  This is stable since last night per RN  Imaging: Ir Angiogram Extremity Left  Result Date:  04/08/2017 INDICATION: 83 year old female with acute thrombosis of left superficial femoral artery stent system. SFA and popliteal stent system placed 03/25/2017 for treatment of critical limb ischemia and nonhealing wound. She returns today for initiation of thrombolysis and treatment EXAM: ULTRASOUND GUIDED ACCESS RIGHT COMMON FEMORAL ARTERY ANGIOGRAM LEFT LOWER EXTREMITY INITIATION OF ARTERIAL THROMBOLYSIS WITH CATHETER DIRECTED THERAPY MEDICATIONS: None ANESTHESIA/SEDATION: Moderate (conscious) sedation was employed during this procedure. A total of Versed 2.0 mg and Fentanyl 100 mcg was administered intravenously. Moderate Sedation Time: 45 minutes. The patient's level of consciousness and vital signs were monitored continuously by radiology nursing throughout the procedure under my direct supervision. CONTRAST:  40 cc Isovue FLUOROSCOPY TIME:  Fluoroscopy Time: 10 minutes 24 seconds (67 mGy). COMPLICATIONS: None PROCEDURE: Informed consent was obtained from the patient following explanation of the procedure, risks, benefits and alternatives. The patient understands, agrees and consents for the procedure. All questions were addressed. A time out was performed prior to the initiation of the procedure. Maximal barrier sterile technique utilized including caps, mask, sterile gowns, sterile gloves, large sterile drape, hand hygiene, and Betadine prep. Ultrasound survey of the right inguinal region was performed with images stored and sent to PACs. A micropuncture needle was used access the right common femoral artery under ultrasound. With excellent arterial blood flow returned, and an .018 micro wire was passed through the needle, observed  enter the abdominal aorta under fluoroscopy. The needle was removed, and a micropuncture sheath was placed over the wire. The inner dilator and wire were removed, and an 035 Bentson wire was advanced under fluoroscopy into the abdominal aorta. The sheath was removed and a  standard 5 [place] vascular sheath was placed. The dilator was removed and the sheath was flushed. Omni Flush catheter was advanced over the Bentson wire into the abdominal aorta and the Bentson wire was retracted to form the curve on the catheter. Catheter and wire were then directed down the contralateral common iliac artery. The wire was advanced to the superficial femoral artery. Catheter was removed an a 5 French vertebral catheter was placed. Angiogram of the left lower extremity was then performed. Rosen wire was then passed through the catheter into the superficial femoral artery in the catheter was removed. Sheath was removed. A 6 French 45 cm Arrow braided sheath was then placed over the wire into the distal external iliac artery. Vertebral catheter was then placed over the Rosen wire in the [place] wire was removed. Angiogram was performed. Combination of Rosen wire and the vertebral catheter were then used to traverse the proximal 2/3 of the thrombosed superficial femoral artery. When the catheter reached the popliteal artery there was significant resistance. Vertebral catheter was removed and a Uni fuse infusion catheter, 135 cm length, 40 cm infusion length was selected. This was placed over the [facility] wire. Rosen wire was then was removed and a stiff Glidewire was then used. The tip of the Uni fuse catheter was slowly advanced into the origin of the posterior tibial artery. Glidewire was removed, limited angiogram was performed, and then the obturator wire was placed. Patient tolerated the procedure well. Patient remained hemodynamically stable throughout. No complications were encountered.  No significant blood loss FINDINGS: Angiogram demonstrates occlusion of the stent system of the distal superficial femoral artery an the popliteal artery. There is no significant reconstitution of flow into the distal tibial arteries, however, patency of the distal popliteal artery, tibioperoneal trunk, and the  proximal 1/3 of the anterior tibial artery was evident. The proximal posterior tibial artery was occluded. The anterior tibial artery is again occluded proximally, which was the case on the prior angiogram. There is critical stenosis at the origin of the peroneal artery. After placement of the infusion catheter, injection demonstrates partial filling of the posterior tibial artery. IMPRESSION: Status post ultrasound guided access of right common femoral artery and placement of a 40 cm length infusion catheter across acute thrombosis of the left superficial femoral artery and popliteal artery for initiation of thrombolysis. Signed, Dulcy Fanny. Earleen Newport, DO [facility] Radiology Electronically Signed   By: Corrie Mckusick D.O.   On: 04/08/2017 13:41   Ir US Guide Vasc Access Right  Result Date: 04/08/2017 INDICATION: 83 year old female with acute thrombosis of left superficial femoral artery stent system. SFA and popliteal stent system placed 03/25/2017 for treatment of critical limb ischemia and nonhealing wound. She returns today for initiation of thrombolysis and treatment EXAM: ULTRASOUND GUIDED ACCESS RIGHT COMMON FEMORAL ARTERY ANGIOGRAM LEFT LOWER EXTREMITY INITIATION OF ARTERIAL THROMBOLYSIS WITH CATHETER DIRECTED THERAPY MEDICATIONS: None ANESTHESIA/SEDATION: Moderate (conscious) sedation was employed during this procedure. A total of Versed 2.0 mg and Fentanyl 100 mcg was administered intravenously. Moderate Sedation Time: 45 minutes. The patient's level of consciousness and vital signs were monitored continuously by radiology nursing throughout the procedure under my direct supervision. CONTRAST:  40 cc Isovue FLUOROSCOPY TIME:  Fluoroscopy Time: 10 minutes 24 seconds (67 mGy). COMPLICATIONS: None PROCEDURE: Informed consent was obtained from the patient following explanation of the procedure, risks, benefits and alternatives. The patient understands, agrees and  consents for the procedure. All questions were addressed. A time out was performed prior to the initiation of the procedure. Maximal barrier sterile technique utilized including caps, mask, sterile gowns, sterile gloves, large sterile drape, hand hygiene, and Betadine prep. Ultrasound survey of the right inguinal region was performed with images stored and sent to PACs. A micropuncture needle was used access the right common femoral artery under ultrasound. With excellent arterial blood flow returned, and an .018 micro wire was passed through the needle, observed enter the abdominal aorta under fluoroscopy. The needle was removed, and a micropuncture sheath was placed over the wire. The inner dilator and wire were removed, and an 035 Bentson wire was advanced under fluoroscopy into the abdominal aorta. The sheath was removed and a standard 5 [place] vascular sheath was placed. The dilator was removed and the sheath was flushed. Omni Flush catheter was advanced over the Bentson wire into the abdominal aorta and the Bentson wire was retracted to form the curve on the catheter. Catheter and wire were then directed down the contralateral common iliac artery. The wire was advanced to the superficial femoral artery. Catheter was removed an a 5 French vertebral catheter was placed. Angiogram of the left lower extremity was then performed. Rosen wire was then passed through the catheter into the superficial femoral artery in the catheter was removed. Sheath was removed. A 6 French 45 cm Arrow braided sheath was then placed over the wire into the distal external iliac artery. Vertebral catheter was then placed over the Rosen wire in the [place] wire was removed. Angiogram was performed. Combination of Rosen wire and the vertebral catheter were then used to traverse the proximal 2/3 of the thrombosed superficial femoral artery. When the catheter reached the popliteal artery there was significant resistance. Vertebral catheter  was removed and a Uni fuse infusion catheter, 135 cm length, 40 cm infusion length was selected. This was placed over the [facility] wire. Rosen wire was then was removed and a stiff Glidewire was then used. The tip of the Uni fuse catheter was slowly advanced into the origin of the posterior tibial artery. Glidewire was removed, limited angiogram was performed, and then the obturator wire was placed. Patient tolerated the procedure well. Patient remained hemodynamically stable throughout. No complications were encountered.  No significant blood loss FINDINGS: Angiogram demonstrates occlusion of the stent system of the distal superficial femoral artery an the popliteal artery. There is no significant reconstitution of flow into the distal tibial arteries, however, patency of the distal popliteal artery, tibioperoneal trunk, and the proximal 1/3 of the anterior tibial artery was evident. The proximal posterior tibial artery was occluded. The anterior tibial artery is again occluded proximally, which was the case on the prior angiogram. There is critical stenosis at the origin of the peroneal artery. After placement of the infusion catheter, injection demonstrates partial filling of the posterior tibial artery. IMPRESSION: Status post ultrasound guided access of right common femoral artery and placement of a 40 cm length infusion catheter across acute thrombosis of the left superficial femoral artery and popliteal artery for initiation of thrombolysis. Signed, Dulcy Fanny. Earleen Newport, DO [facility] Radiology Electronically Signed   By: Corrie Mckusick D.O.   On: 04/08/2017 13:41   Ir Infusion Thrombol Arterial Initial (ms)  Result Date: 04/08/2017 INDICATION: 83 year old female with acute thrombosis of left superficial femoral artery stent system. SFA and popliteal stent system placed 03/25/2017 for treatment of critical limb ischemia and nonhealing wound. She returns today for  initiation of thrombolysis and treatment EXAM: ULTRASOUND GUIDED ACCESS RIGHT COMMON FEMORAL ARTERY ANGIOGRAM LEFT LOWER EXTREMITY INITIATION OF ARTERIAL THROMBOLYSIS WITH CATHETER DIRECTED THERAPY MEDICATIONS: None ANESTHESIA/SEDATION: Moderate (conscious) sedation was employed during this procedure. A total of Versed 2.0 mg and Fentanyl 100 mcg was administered intravenously. Moderate Sedation Time: 45 minutes. The patient's level of consciousness and vital signs were monitored continuously by radiology nursing throughout the procedure under my direct supervision. CONTRAST:  40 cc Isovue FLUOROSCOPY TIME:  Fluoroscopy Time: 10 minutes 24 seconds (67 mGy). COMPLICATIONS: None PROCEDURE: Informed consent was obtained from the patient following explanation of the procedure, risks, benefits and alternatives. The patient understands, agrees and consents for the procedure. All questions were addressed. A time out was performed prior to the initiation of the procedure. Maximal barrier sterile technique utilized including caps, mask, sterile gowns, sterile gloves, large sterile drape, hand hygiene, and Betadine prep. Ultrasound survey of the right inguinal region was performed with images stored and sent to PACs. A micropuncture needle was used access the right common femoral artery under ultrasound. With excellent arterial blood flow returned, and an .018 micro wire was passed through the needle, observed enter the abdominal aorta under fluoroscopy. The needle was removed, and a micropuncture sheath was placed over the wire. The inner dilator and wire were removed, and an 035 Bentson wire was advanced under fluoroscopy into the abdominal aorta. The sheath was removed and a standard 5 [place] vascular sheath was placed. The dilator was removed and the sheath was flushed. Omni Flush catheter was advanced over the Bentson wire into the abdominal aorta and the Bentson wire was retracted to form the curve on the catheter.  Catheter and wire were then directed down the contralateral common iliac artery. The wire was advanced to the superficial femoral artery. Catheter was removed an a 5 French vertebral catheter was placed. Angiogram of the left lower extremity was then performed. Rosen wire was then passed through the catheter into the superficial femoral artery in the catheter was removed. Sheath was removed. A 6 French 45 cm Arrow braided sheath was then placed over the wire into the distal external iliac artery. Vertebral catheter was then placed over the Rosen wire in the [place] wire was removed. Angiogram was performed. Combination of Rosen wire and the vertebral catheter were then used to traverse the proximal 2/3 of the thrombosed superficial femoral artery. When the catheter reached the popliteal artery there was significant resistance. Vertebral catheter was removed and a Uni fuse infusion catheter, 135 cm length, 40 cm infusion length was selected. This was placed over the [facility] wire. Rosen wire was then was removed and a stiff Glidewire was then used. The tip of the Uni fuse catheter was slowly advanced into the origin of the posterior tibial artery. Glidewire was removed, limited angiogram was performed, and then the obturator wire was placed. Patient tolerated the procedure well. Patient remained hemodynamically stable throughout. No complications were encountered.  No significant blood loss FINDINGS: Angiogram demonstrates occlusion of the stent system of the distal superficial femoral artery an the popliteal artery. There is no significant reconstitution of flow into the distal tibial arteries, however, patency of the distal popliteal artery, tibioperoneal trunk, and the proximal 1/3 of the anterior tibial artery was evident. The  proximal posterior tibial artery was occluded. The anterior tibial artery is again occluded proximally, which was the case on the prior angiogram. There is critical stenosis at the origin of the  peroneal artery. After placement of the infusion catheter, injection demonstrates partial filling of the posterior tibial artery. IMPRESSION: Status post ultrasound guided access of right common femoral artery and placement of a 40 cm length infusion catheter across acute thrombosis of the left superficial femoral artery and popliteal artery for initiation of thrombolysis. Signed, Dulcy Fanny. Earleen Newport, DO [facility] Radiology Electronically Signed   By: Corrie Mckusick D.O.   On: 04/08/2017 13:41    Labs:  CBC:  Recent Labs  04/07/17 0850 04/08/17 1037 04/09/17 0127 04/09/17 0552  WBC 10.7* 20.1* 15.4* 13.8*  HGB 14.7 14.8 13.4 13.1  HCT 45.0 45.3 40.4 39.0  PLT 322 269 218 235    COAGS:  Recent Labs  03/25/17 0645 04/07/17 0850 04/08/17 0701 04/09/17 0552  INR 1.16 1.15 1.19 1.36  APTT 32  --   --   --     BMP:  Recent Labs  02/13/17 2236 03/18/17 1138 03/25/17 0645 04/07/17 0850  NA 141 128* 137 138  K 3.7 4.5 4.3 3.6  CL 101 97* 100* 101  CO2  --  21* 27 25  GLUCOSE 296* 234* 214* 314*  BUN 24* 27* 21* 16  CALCIUM  --  9.5 9.4 9.0  CREATININE 0.80 1.23* 0.84 0.83  GFRNONAA  --  39* >60 >60  GFRAA  --  45* >60 >60    LIVER FUNCTION TESTS:  Recent Labs  03/18/17 1138  BILITOT 1.5*  AST 23  ALT 24  ALKPHOS 77  PROT 6.8  ALBUMIN 3.5    Assessment and Plan: 1. Left leg acute limb ischemia, lysis in process The patient no longer has a cold foot.  This is now warm and she has a dopplerable pulse.  Her last fibrinogen was 198.  Current fibrinogen is pending.  Cont TNK for now We will plan to proceed with completion today per Dr. Vernard Gambles.  Electronically Signed: Henreitta Cea 04/09/2017, 9:15 AM   I spent a total of 15 Minutes at the the patient's bedside AND on the patient's hospital floor or unit, greater than 50% of which was counseling/coordinating care for left leg limb ischemia

## 2017-04-10 DIAGNOSIS — Z96642 Presence of left artificial hip joint: Secondary | ICD-10-CM | POA: Diagnosis present

## 2017-04-10 DIAGNOSIS — I240 Acute coronary thrombosis not resulting in myocardial infarction: Secondary | ICD-10-CM | POA: Diagnosis not present

## 2017-04-10 DIAGNOSIS — I998 Other disorder of circulatory system: Secondary | ICD-10-CM | POA: Diagnosis not present

## 2017-04-10 DIAGNOSIS — Z79899 Other long term (current) drug therapy: Secondary | ICD-10-CM | POA: Diagnosis not present

## 2017-04-10 DIAGNOSIS — K219 Gastro-esophageal reflux disease without esophagitis: Secondary | ICD-10-CM | POA: Diagnosis present

## 2017-04-10 DIAGNOSIS — Z7982 Long term (current) use of aspirin: Secondary | ICD-10-CM | POA: Diagnosis not present

## 2017-04-10 DIAGNOSIS — Z96651 Presence of right artificial knee joint: Secondary | ICD-10-CM | POA: Diagnosis present

## 2017-04-10 DIAGNOSIS — Z7901 Long term (current) use of anticoagulants: Secondary | ICD-10-CM | POA: Diagnosis not present

## 2017-04-10 DIAGNOSIS — I70202 Unspecified atherosclerosis of native arteries of extremities, left leg: Secondary | ICD-10-CM | POA: Diagnosis present

## 2017-04-10 DIAGNOSIS — M6250 Muscle wasting and atrophy, not elsewhere classified, unspecified site: Secondary | ICD-10-CM | POA: Diagnosis not present

## 2017-04-10 DIAGNOSIS — I4891 Unspecified atrial fibrillation: Secondary | ICD-10-CM | POA: Diagnosis present

## 2017-04-10 DIAGNOSIS — Z7409 Other reduced mobility: Secondary | ICD-10-CM | POA: Diagnosis not present

## 2017-04-10 DIAGNOSIS — Z89421 Acquired absence of other right toe(s): Secondary | ICD-10-CM | POA: Diagnosis not present

## 2017-04-10 DIAGNOSIS — I7092 Chronic total occlusion of artery of the extremities: Secondary | ICD-10-CM | POA: Diagnosis present

## 2017-04-10 LAB — PROTIME-INR
INR: 1.27
Prothrombin Time: 15.9 seconds — ABNORMAL HIGH (ref 11.4–15.2)

## 2017-04-10 LAB — HEPARIN LEVEL (UNFRACTIONATED): Heparin Unfractionated: 0.13 IU/mL — ABNORMAL LOW (ref 0.30–0.70)

## 2017-04-10 MED ORDER — WARFARIN SODIUM 3 MG PO TABS: 3.0000 mg | ORAL_TABLET | Freq: Once | ORAL | Status: DC

## 2017-04-10 MED ORDER — HEPARIN (PORCINE) IN NACL 100-0.45 UNIT/ML-% IJ SOLN: 950.0000 [IU]/h | INTRAMUSCULAR | Status: AC

## 2017-04-10 MED ORDER — ENOXAPARIN SODIUM 60 MG/0.6ML ~~LOC~~ SOLN
50.0000 mg | Freq: Two times a day (BID) | SUBCUTANEOUS | Status: DC
  Administered 2017-04-10: 50 mg via SUBCUTANEOUS
  Filled 2017-04-10: qty 0.6

## 2017-04-10 MED ORDER — ENOXAPARIN SODIUM 60 MG/0.6ML ~~LOC~~ SOLN: 50.0000 mg | Freq: Two times a day (BID) | SUBCUTANEOUS | 0 refills | Status: DC

## 2017-04-10 MED ORDER — CLOPIDOGREL BISULFATE 75 MG PO TABS
75.0000 mg | ORAL_TABLET | Freq: Every day | ORAL | 1 refills | Status: DC
Start: 2017-04-11 — End: 2018-01-20

## 2017-04-10 MED ORDER — METOPROLOL SUCCINATE ER 25 MG PO TB24
25.0000 mg | ORAL_TABLET | Freq: Every day | ORAL | Status: DC
  Administered 2017-04-10: 25 mg via ORAL
  Filled 2017-04-10: qty 1

## 2017-04-10 NOTE — NC FL2 (Signed)
[place] LEVEL OF CARE SCREENING TOOL     IDENTIFICATION  Patient Name: [name] Birthdate: 1928/04/03 Sex: female Admission Date (Current Location): 04/08/2017  [facility] and [place] Number:  Herbalist and Address:  [facility] [place] [address] [place], [place] [address]      Provider Number: [id number]  Attending Physician Name and Address:  Corrie Mckusick, DO  Relative Name and Phone Number:       Current Level of Care: Hospital Recommended Level of Care: [place] Prior Approval Number:    Date Approved/Denied:   PASRR Number: [id number]  Discharge Plan: SNF    Current Diagnoses: Patient Active Problem List   Diagnosis Date Noted  . Critical lower limb ischemia 04/08/2017  . Long term (current) use of anticoagulants [Z79.01] 12/13/2015  . PAD (peripheral artery disease) ([place]) 10/05/2015  . ULCER OF OTHER PART OF FOOT 06/06/2010  . ESSENTIAL HYPERTENSION 03/26/2010  . ATRIAL FIBRILLATION 03/26/2010  . CUTANEOUS ERUPTIONS, DRUG-INDUCED 03/26/2010  . DIARRHEA 03/26/2010  . GERD 03/20/2010  . UNSPEC LOCAL INFECTION SKIN&SUBCUTANEOUS TISSUE 03/20/2010  . OSTEOARTHRITIS 03/20/2010  . ARTHRITIS, SEPTIC 03/14/2010  . OTH COMPLICATIONS DUE INTERNAL JOINT PROSTHESIS 02/22/2010    Orientation RESPIRATION BLADDER Height & Weight     Self, Time, Situation, Place  Normal Continent Weight: 115 lb (52.2 kg) Height:  5\' 2"  (157.5 cm)  BEHAVIORAL SYMPTOMS/MOOD NEUROLOGICAL BOWEL NUTRITION STATUS      Continent Diet (cardiac)  AMBULATORY STATUS COMMUNICATION OF NEEDS Skin   Extensive Assist Verbally Normal                       Personal Care Assistance Level of Assistance  Bathing, Dressing Bathing Assistance: Maximum assistance   Dressing Assistance: Maximum assistance     Functional Limitations Info             SPECIAL CARE FACTORS FREQUENCY  PT (By licensed PT), OT (By licensed OT)      PT Frequency: 5/wk OT Frequency: 5/wk            Contractures      Additional Factors Info  Code Status, Allergies   Allergies Info: Ceftriaxone Sodium, Phenergan Promethazine Hcl, Sulfonamide Derivatives, Vancomycin Hcl           Current Medications (04/10/2017):  This is the current hospital active medication list Current Facility-Administered Medications  Medication Dose Route Frequency Provider Last Rate Last Dose  . 0.9 %  sodium chloride infusion  250 mL Intravenous PRN Corrie Mckusick, DO      . acetaminophen (TYLENOL) tablet 650 mg  650 mg Oral Q6H PRN Corrie Mckusick, DO      . clopidogrel (PLAVIX) tablet 75 mg  75 mg Oral Daily Saverio Danker, PA-C   75 mg at 04/10/17 0947  . enoxaparin (LOVENOX) injection 50 mg  50 mg Subcutaneous Q12H Corrie Mckusick, DO      . HYDROmorphone (DILAUDID) injection 1 mg  1 mg Intravenous Q2H PRN Corrie Mckusick, DO   1 mg at 04/09/17 1036  . MEDLINE mouth rinse  15 mL Mouth Rinse BID Corrie Mckusick, DO   15 mL at 04/10/17 0949  . metoprolol succinate (TOPROL-XL) 24 hr tablet 25 mg  25 mg Oral Daily Corrie Mckusick, DO   25 mg at 04/10/17 0947  . nicardipine (CARDENE) 20mg  in 0.86% saline 229ml IV infusion (0.1 mg/ml)  3-15 mg/hr Intravenous Continuous Corrie Mckusick, DO  Stopped at 04/08/17 2100  . ondansetron (ZOFRAN) injection 4 mg  4 mg Intravenous Q6H PRN Corrie Mckusick, DO   4 mg at 04/08/17 1456  . sodium chloride flush (NS) 0.9 % injection 3 mL  3 mL Intravenous Q12H Corrie Mckusick, DO      . sodium chloride flush (NS) 0.9 % injection 3 mL  3 mL Intravenous PRN Corrie Mckusick, DO      . warfarin (COUMADIN) tablet 3 mg  3 mg Oral ONCE-1800 Corrie Mckusick, DO      . Warfarin - Pharmacist Dosing Inpatient   Does not apply q1800 Delane Ginger [facility]         Discharge Medications: Please see discharge summary for a list of discharge medications.  Relevant Imaging Results:  Relevant Lab Results:   Additional Information SS#:  [id number]  Jorge Ny, LCSW

## 2017-04-10 NOTE — Care Management Note (Signed)
Case Management Note  Patient Details  Name: [name] MRN: [id number] Date of Birth: 1928/02/21  Subjective/Objective:     NCM spoke with patient family regarding [facility] services at home, daughters state that they were told patient will have 24 hr care with [facility] services, NCM informed them that this is not the case. [facility] comes out 2 to 3 times a week and they are there from 30 to 40 mins. Daughter states she will not be able to lift patient up.  NCM informed them that the recs were for SNF, they state that they would like to go to SNF.  NCM called CSW to come to speak with patient family about SNF.  The CSW informed them they would have to pay privately because she does not have a three day stay as inpatient, they state they can do that.  CSW working on placement .  NCM will notify the MD of this change.               Action/Plan:   Expected Discharge Date:  04/10/17               Expected Discharge Plan:     In-House Referral:     Discharge planning Services  CM Consult  Post Acute Care Choice:    Choice offered to:     DME Arranged:    DME Agency:     HH Arranged:    HH Agency:     Status of Service:  In process, will continue to follow  If discussed at Long Length of Stay Meetings, dates discussed:    Additional Comments:  Zenon Mayo, RN 04/10/2017, 3:01 PM

## 2017-04-10 NOTE — Progress Notes (Signed)
Interventional Radiology Progress Note  HPI: Ms Narayanan is 83 year old female admitted for treatment of ALI of left leg.  She is POD 1, SP catheter thrombolysis, with restoration of in-line flow to the ankle.    Interval:  Right CFA sheath was removed yesterday.    Patient is feeling much better, with much improved left foot pain, although some residual "funny feeling" in the foot.  No events overnight.   Studies: Non-invasive exam was performed at the right leg, for question of acute change last night.  I have reviewed the images.  Flow is maintained through the fem-pop segment, with monophasic flow in the tibials.    PE: Her left leg/foot is warm and well perfused.  Doppler and palpable pulse at the ankle.  Mild swelling of the left foot, with motor and sensory intact.  Her right leg is warm.  Motor sensory intact.  Doppler signal is present at the PT and DP.   Right inguinal region is soft, mild tender.  Dressing is dry.  Minimal ecchymosis.  Right antecubital region with hematoma.  The margins are no larger that the marked borders, which seem to have receded since the mark performed.  Palpable right radial pulse with motor/sensory intact.    Assessment/Plan:  Restoration of flow to the left foot.  No concern for ALI of the left or right.  Known underlying PAD bilateral.   Swelling of the left foot secondary to post-perfusion syndrome.  Anticipate will resolve with usual care.  Continue on her IV heparin.  Will discuss a plan for anti-coagulation with her physician, Dr. Leanna Battles.  Her disposition with length of stay is dependent on [facility] strategy.    Continue daily plavix.  Loading dose yesterday.  Restart home metoprolol Will tx to med-surg.   Advance diet.  OOB to chair with assist.  Will order PT consult.   Continue dressing changes at right hip.  Local care of right arm hematoma.    Call with question/concerns [phone]  Signed,  Dulcy Fanny. Earleen Newport, DO

## 2017-04-10 NOTE — Discharge Instructions (Addendum)
Angiogram, Care After This sheet gives you information about how to care for yourself after your procedure. Your doctor may also give you more specific instructions. If you have problems or questions, contact your doctor. Follow these instructions at home: Insertion site care   Follow instructions from your doctor about how to take care of your long, thin tube (catheter) insertion area. Make sure you:  Wash your hands with soap and water before you change your bandage (dressing). If you cannot use soap and water, use hand sanitizer.  Change your bandage as told by your doctor. You may place a band-aid over the site as needed starting on 5/25  Do not take baths, swim, or use a hot tub until your doctor says it is okay.  You may shower 24-48 hours after the procedure or as told by your doctor.  Gently wash the area with plain soap and water.  Pat the area dry with a clean towel.  Do not rub the area. This may cause bleeding.  Do not apply powder or lotion to the area. Keep the area clean and dry.  Check your insertion area every day for signs of infection. Check for:  More redness, swelling, or pain.  Fluid or blood.  Warmth.  Pus or a bad smell. Activity   Rest as told by your doctor, usually for 1-2 days.  Do not lift anything that is heavier than 10 lbs. (4.5 kg) or as told by your doctor for 2 weeks.  Do not drive for 2 weeks  Do not drive or use heavy machinery while taking prescription pain medicine. General instructions   Go back to your normal activities as told by your doctor, usually in about a week. Ask your doctor what activities are safe for you.  If the insertion area starts to bleed, lie flat and put pressure on the area. If the bleeding does not stop, get help right away. This is an emergency.  Drink enough fluid to keep your pee (urine) clear or pale yellow.  Take over-the-counter and prescription medicines only as told by your doctor.  Keep all  follow-up visits as told by your doctor. This is important. Contact a doctor if:  You have a fever.  You have chills.  You have more redness, swelling, or pain around your insertion area.  You have fluid or blood coming from your insertion area.  The insertion area feels warm to the touch.  You have pus or a bad smell coming from your insertion area.  You have more bruising around the insertion area.  Blood collects in the tissue around the insertion area (hematoma) that may be painful to the touch. Get help right away if:  You have a lot of pain in the insertion area.  The insertion area swells very fast.  The insertion area is bleeding, and the bleeding does not stop after holding steady pressure on the area.  The area near or just beyond the insertion area becomes pale, cool, tingly, or numb. These symptoms may be an emergency. Do not wait to see if the symptoms will go away. Get medical help right away. Call your local emergency services (911 in the [place] Do not drive yourself to the hospital. Summary  After the procedure, it is common to have bruising and tenderness at the long, thin tube insertion area.  After the procedure, it is important to rest and drink plenty of fluids.  Do not take baths, swim, or use a hot tub  until your doctor says it is okay to do so. You may shower 24-48 hours after the procedure or as told by your doctor.  If the insertion area starts to bleed, lie flat and put pressure on the area. If the bleeding does not stop, get help right away. This is an emergency. This information is not intended to replace advice given to you by your health care provider. Make sure you discuss any questions you have with your health care provider. Document Released: 01/31/2009 Document Revised: 10/29/2016 Document Reviewed: 10/29/2016 Elsevier Interactive Patient Education  2017 [employer].   Enoxaparin injection What is this medicine? ENOXAPARIN (ee nox a PA  rin) is used after knee, hip, or abdominal surgeries to prevent blood clotting. It is also used to treat existing blood clots in the lungs or in the veins. This medicine may be used for other purposes; ask your health care provider or pharmacist if you have questions. COMMON BRAND NAME(S): Lovenox What should I tell my health care provider before I take this medicine? They need to know if you have any of these conditions: -bleeding disorders, hemorrhage, or hemophilia -infection of the heart or heart valves -kidney or liver disease -previous stroke -prosthetic heart valve -recent surgery or delivery of a baby -ulcer in the stomach or intestine, diverticulitis, or other bowel disease -an unusual or allergic reaction to enoxaparin, heparin, pork or pork products, other medicines, foods, dyes, or preservatives -pregnant or trying to get pregnant -breast-feeding How should I use this medicine? This medicine is for injection under the skin. It is usually given by a health-care professional. You or a family member may be trained on how to give the injections. If you are to give yourself injections, make sure you understand how to use the syringe, measure the dose if necessary, and give the injection. To avoid bruising, do not rub the site where this medicine has been injected. Do not take your medicine more often than directed. Do not stop taking except on the advice of your doctor or health care professional. Make sure you receive a puncture-resistant container to dispose of the needles and syringes once you have finished with them. Do not reuse these items. Return the container to your doctor or health care professional for proper disposal. Talk to your pediatrician regarding the use of this medicine in children. Special care may be needed. Overdosage: If you think you have taken too much of this medicine contact a poison control center or emergency room at once. NOTE: This medicine is only for you.  Do not share this medicine with others. What if I miss a dose? If you miss a dose, take it as soon as you can. If it is almost time for your next dose, take only that dose. Do not take double or extra doses. What may interact with this medicine? -aspirin and aspirin-like medicines -certain medicines that treat or prevent blood clots -dipyridamole -NSAIDs, medicines for pain and inflammation, like ibuprofen or naproxen This list may not describe all possible interactions. Give your health care provider a list of all the medicines, herbs, non-prescription drugs, or dietary supplements you use. Also tell them if you smoke, drink alcohol, or use illegal drugs. Some items may interact with your medicine. What should I watch for while using this medicine? Visit your doctor or health care professional for regular checks on your progress. Your condition will be monitored carefully while you are receiving this medicine. Notify your doctor or health care professional and  seek emergency treatment if you develop breathing problems; changes in vision; chest pain; severe, sudden headache; pain, swelling, warmth in the leg; trouble speaking; sudden numbness or weakness of the face, arm, or leg. These can be signs that your condition has gotten worse. If you are going to have surgery, tell your doctor or health care professional that you are taking this medicine. Do not stop taking this medicine without first talking to your doctor. Be sure to refill your prescription before you run out of medicine. Avoid sports and activities that might cause injury while you are using this medicine. Severe falls or injuries can cause unseen bleeding. Be careful when using sharp tools or knives. Consider using an Copy. Take special care brushing or flossing your teeth. Report any injuries, bruising, or red spots on the skin to your doctor or health care professional. What side effects may I notice from receiving this  medicine? Side effects that you should report to your doctor or health care professional as soon as possible: -allergic reactions like skin rash, itching or hives, swelling of the face, lips, or tongue -feeling faint or lightheaded, falls -signs and symptoms of bleeding such as bloody or black, tarry stools; red or dark-brown urine; spitting up blood or brown material that looks like coffee grounds; red spots on the skin; unusual bruising or bleeding from the eye, gums, or nose Side effects that usually do not require medical attention (report to your doctor or health care professional if they continue or are bothersome): -pain, redness, or irritation at site where injected This list may not describe all possible side effects. Call your doctor for medical advice about side effects. You may report side effects to FDA at 1-800-FDA-1088. Where should I keep my medicine? Keep out of the reach of children. Store at room temperature between 15 and 30 degrees C (59 and 86 degrees F). Do not freeze. If your injections have been specially prepared, you may need to store them in the refrigerator. Ask your pharmacist. Throw away any unused medicine after the expiration date. NOTE: This sheet is a summary. It may not cover all possible information. If you have questions about this medicine, talk to your doctor, pharmacist, or health care provider.  2018 Elsevier/Gold Standard (2014-03-08 16:06:21)   Clopidogrel tablets What is this medicine? CLOPIDOGREL (kloh PID oh grel) helps to prevent blood clots. This medicine is used to prevent heart attack, stroke, or other vascular events in people who are at high risk. This medicine may be used for other purposes; ask your health care provider or pharmacist if you have questions. COMMON BRAND NAME(S): Plavix What should I tell my health care provider before I take this medicine? They need to know if you have any of the following conditions: -bleeding  disorders -bleeding in the brain -having surgery -history of stomach bleeding -an unusual or allergic reaction to clopidogrel, other medicines, foods, dyes, or preservatives -pregnant or trying to get pregnant -breast-feeding How should I use this medicine? Take this medicine by mouth with a glass of water. Follow the directions on the prescription label. You may take this medicine with or without food. If it upsets your stomach, take it with food. Take your medicine at regular intervals. Do not take it more often than directed. Do not stop taking except on your doctor's advice. A special MedGuide will be given to you by the pharmacist with each prescription and refill. Be sure to read this information carefully each time. Talk to  your pediatrician regarding the use of this medicine in children. Special care may be needed. Overdosage: If you think you have taken too much of this medicine contact a poison control center or emergency room at once. NOTE: This medicine is only for you. Do not share this medicine with others. What if I miss a dose? If you miss a dose, take it as soon as you can. If it is almost time for your next dose, take only that dose. Do not take double or extra doses. What may interact with this medicine? Do not take this medicine with the following medications: -dasabuvir; ombitasvir; paritaprevir; ritonavir -defibrotide This medicine may also interact with the following medications: -antiviral medicines for HIV or AIDS -aspirin -certain medicines for depression like citalopram, fluoxetine, fluvoxamine -certain medicines for fungal infections like ketoconazole, fluconazole, voriconazole -certain medicines for seizures like felbamate, oxcarbazepine, phenytoin -certain medicines for stomach problems like cimetidine, omeprazole, esomeprazole -certain medicines that treat or prevent blood clots like warfarin, enoxaparin, dalteparin, apixaban, dabigatran, rivaroxaban,  ticlopidine -chloramphenicol -cilostazol -fluvastatin -isoniazid -modafinil -nicardipine -NSAIDS, medicines for pain and inflammation, like ibuprofen or naproxen -quinine -repaglinide -tamoxifen -tolbutamide -topiramate -torsemide This list may not describe all possible interactions. Give your health care provider a list of all the medicines, herbs, non-prescription drugs, or dietary supplements you use. Also tell them if you smoke, drink alcohol, or use illegal drugs. Some items may interact with your medicine. What should I watch for while using this medicine? Visit your doctor or health care professional for regular check ups. Do not stop taking your medicine unless your doctor tells you to. Notify your doctor or health care professional and seek emergency treatment if you develop breathing problems; changes in vision; chest pain; severe, sudden headache; pain, swelling, warmth in the leg; trouble speaking; sudden numbness or weakness of the face, arm or leg. These can be signs that your condition has gotten worse. If you are going to have surgery or dental work, tell your doctor or health care professional that you are taking this medicine. Certain genetic factors may reduce the effect of this medicine. Your doctor may use genetic tests to determine treatment. What side effects may I notice from receiving this medicine? Side effects that you should report to your doctor or health care professional as soon as possible: -allergic reactions like skin rash, itching or hives, swelling of the face, lips, or tongue -signs and symptoms of bleeding such as bloody or black, tarry stools; red or dark-brown urine; spitting up blood or brown material that looks like coffee grounds; red spots on the skin; unusual bruising or bleeding from the eye, gums, or nose -signs and symptoms of a blood clot such as breathing problems; changes in vision; chest pain; severe, sudden headache; pain, swelling, warmth  in the leg; trouble speaking; sudden numbness or weakness of the face, arm or leg Side effects that usually do not require medical attention (report to your doctor or health care professional if they continue or are bothersome): -constipation -diarrhea -headache -upset stomach This list may not describe all possible side effects. Call your doctor for medical advice about side effects. You may report side effects to FDA at 1-800-FDA-1088. Where should I keep my medicine? Keep out of the reach of children. Store at room temperature of 59 to 86 degrees F (15 to 30 degrees C). Throw away any unused medicine after the expiration date. NOTE: This sheet is a summary. It may not cover all possible information. If you have  questions about this medicine, talk to your doctor, pharmacist, or health care provider.  2018 Elsevier/Gold Standard (2015-08-10 10:00:44)   Information on my medicine - Coumadin   (Warfarin)  This medication education was reviewed with me or my healthcare representative as part of my discharge preparation.  The pharmacist that spoke with me during my hospital stay was:  Delane Ginger, [facility] [place]  Why was Coumadin prescribed for you? Coumadin was prescribed for you because you have a blood clot or a medical condition that can cause an increased risk of forming blood clots. Blood clots can cause serious health problems by blocking the flow of blood to the heart, lung, or brain. Coumadin can prevent harmful blood clots from forming. As a reminder your indication for Coumadin is:   Select from menu  What test will check on my response to Coumadin? While on Coumadin (warfarin) you will need to have an INR test regularly to ensure that your dose is keeping you in the desired range. The INR (international normalized ratio) number is calculated from the result of the laboratory test called prothrombin time (PT).  If an INR APPOINTMENT HAS NOT ALREADY BEEN MADE FOR YOU please schedule an  appointment to have this lab work done by your health care provider within 7 days. Your INR goal is usually a number between:  2 to 3 or your provider may give you a more narrow range like 2-2.5.  Ask your health care provider during an office visit what your goal INR is.  What  do you need to  know  About  COUMADIN? Take Coumadin (warfarin) exactly as prescribed by your healthcare provider about the same time each day.  DO NOT stop taking without talking to the doctor who prescribed the medication.  Stopping without other blood clot prevention medication to take the place of Coumadin may increase your risk of developing a new clot or stroke.  Get refills before you run out.  What do you do if you miss a dose? If you miss a dose, take it as soon as you remember on the same day then continue your regularly scheduled regimen the next day.  Do not take two doses of Coumadin at the same time.  Important Safety Information A possible side effect of Coumadin (Warfarin) is an increased risk of bleeding. You should call your healthcare provider right away if you experience any of the following: ? Bleeding from an injury or your nose that does not stop. ? Unusual colored urine (red or dark brown) or unusual colored stools (red or black). ? Unusual bruising for unknown reasons. ? A serious fall or if you hit your head (even if there is no bleeding).  Some foods or medicines interact with Coumadin (warfarin) and might alter your response to warfarin. To help avoid this: ? Eat a balanced diet, maintaining a consistent amount of Vitamin K. ? Notify your provider about major diet changes you plan to make. ? Avoid alcohol or limit your intake to 1 drink for women and 2 drinks for men per day. (1 drink is 5 oz. wine, 12 oz. beer, or 1.5 oz. liquor.)  Make sure that ANY health care provider who prescribes medication for you knows that you are taking Coumadin (warfarin).  Also make sure the healthcare provider  who is monitoring your Coumadin knows when you have started a new medication including herbals and non-prescription products.  Coumadin (Warfarin)  Major Drug Interactions  Increased Warfarin Effect Decreased Warfarin Effect  Alcohol (large quantities) Antibiotics (esp. Septra/Bactrim, Flagyl, Cipro) Amiodarone (Cordarone) Aspirin (ASA) Cimetidine (Tagamet) Megestrol (Megace) NSAIDs (ibuprofen, naproxen, etc.) Piroxicam (Feldene) Propafenone (Rythmol SR) Propranolol (Inderal) Isoniazid (INH) Posaconazole (Noxafil) Barbiturates (Phenobarbital) Carbamazepine (Tegretol) Chlordiazepoxide (Librium) Cholestyramine (Questran) Griseofulvin Oral Contraceptives Rifampin Sucralfate (Carafate) Vitamin K   Coumadin (Warfarin) Major Herbal Interactions  Increased Warfarin Effect Decreased Warfarin Effect  Garlic Ginseng Ginkgo biloba Coenzyme Q10 Green tea St. Johns wort    Coumadin (Warfarin) FOOD Interactions  Eat a consistent number of servings per week of foods HIGH in Vitamin K (1 serving =  cup)  Collards (cooked, or boiled & drained) Kale (cooked, or boiled & drained) Mustard greens (cooked, or boiled & drained) Parsley *serving size only =  cup Spinach (cooked, or boiled & drained) Swiss chard (cooked, or boiled & drained) Turnip greens (cooked, or boiled & drained)  Eat a consistent number of servings per week of foods MEDIUM-HIGH in Vitamin K (1 serving = 1 cup)  Asparagus (cooked, or boiled & drained) Broccoli (cooked, boiled & drained, or raw & chopped) Brussel sprouts (cooked, or boiled & drained) *serving size only =  cup Lettuce, raw (green leaf, endive, romaine) Spinach, raw Turnip greens, raw & chopped   These websites have more information on Coumadin (warfarin):  FailFactory.se; VeganReport.com.au;

## 2017-04-10 NOTE — Discharge Summary (Signed)
Patient ID: EMIE SOMMERFELD MRN: [id number] DOB/AGE: 1928/08/02 83 y.o.  Admit date: 04/08/2017 Discharge date: 04/10/2017  Supervising Physician: Corrie Mckusick  Admission Diagnoses: Critical LLE ischemia  Discharge Diagnoses:  Active Problems:   Critical lower limb ischemia   Discharged Condition: good  Hospital Course: The patient was admitted and found to have a cold left lower extremity with no pulse able to be found by palpation or doppler.  She was taken to the IR suite where she underwent LLE arteriogram with restoration of LLE outflow  With PT runoff to ankle, reconstituted distal AT/DP and peroneal flow.  This was a 2-day procedure started on 5/22 and completed on 5/23 with restoration of pulses.  The patient is feeling better.  She has been evaluated by PT for mobility.  They have recommended SNF vs HH with 24 hr assist.  The patient has family who will be with her for 24-hrs and they would like to go home.  She also takes coumadin at home for A fib.  Her INR today is 1.36.  She has been on a heparin drip while here.  We have asked pharmacy to dose her for a lovenox bridge at home so she does not require inpatient admission until her INR is therapeutic.  We have also reached out to her PCP who is fine with this as well.  Her heparin drip has been stopped today, day of discharge, and she has been started on Lovenox SQ 50mg  BID.  She has also been started on Plavix secondary to her lower extremity ischemia.  She will continue on this at home as well.  The patient is currently stable for dc home.  Her new Rx have been sent to her pharmacy.  HH PT has been arranged and her PCP office is setting up INR follow up.   Consults: None  Disposition: 01-Home or Self Care   Allergies as of 04/10/2017      Reactions   Ceftriaxone Sodium Other (See Comments)   Tingly feeling after given, then one hour later while vanco infusing pruritic rash   Phenergan [promethazine Hcl]    Hallucinations   Sulfonamide Derivatives Hives   Vancomycin Hcl    Rash happened 1hr after rocephin but immediately after infusing vancomycin      Medication List    TAKE these medications   acetaminophen 325 MG tablet Commonly known as:  TYLENOL Take 650 mg by mouth daily.   aspirin EC 325 MG tablet Take 325 mg by mouth at bedtime.   calcium carbonate 1500 (600 Ca) MG Tabs tablet Commonly known as:  OSCAL Take 600 mg of elemental calcium by mouth daily with breakfast.   clopidogrel 75 MG tablet Commonly known as:  PLAVIX Take 1 tablet (75 mg total) by mouth daily. Start taking on:  04/11/2017   enoxaparin 60 MG/0.6ML injection Commonly known as:  LOVENOX Inject 0.5 mLs (50 mg total) into the skin every 12 (twelve) hours.   ferrous sulfate 325 (65 FE) MG tablet Take 325 mg by mouth daily with breakfast.   metoprolol succinate 25 MG 24 hr tablet Commonly known as:  TOPROL-XL Take 25 mg by mouth at bedtime.   vitamin B-12 1000 MCG tablet Commonly known as:  CYANOCOBALAMIN Take 1,000 mcg by mouth daily.   Vitamin D (Ergocalciferol) 50000 units Caps capsule Commonly known as:  DRISDOL Take 50,000 Units by mouth every 7 (seven) days. Sundays   warfarin 2.5 MG tablet Commonly known as:  COUMADIN Take  2.5 mg by mouth daily at 6 PM.      Follow-up Information    Corrie Mckusick, DO Follow up.   Specialty:  Interventional Radiology Why:  Our office will call you with your appointment date and time. Contact information: [address] [place] [address] [phone]            Electronically Signed: Henreitta Cea 04/10/2017, 2:15 PM   I have spent Less Than 30 Minutes discharging Noberto Retort.

## 2017-04-10 NOTE — Progress Notes (Addendum)
CSW received call that pt family does not think that pt is safe to return home and interested in SNF.  CSW met with pt and family and explained that Medicare requires 3 night stay to pay for SNF which family is not eligible for and that payment would have to be private.  Family expressed understanding and are in agreement with transfer to SNF with private pay.  Patient will discharge to [facility] Anticipated discharge date: 5/24 Family notified: at bedside Transportation by PTAR- called at 3:25pm  [place] signing off.  Jorge Ny, LCSW Clinical Social Worker [phone]

## 2017-04-10 NOTE — Clinical Social Work Placement (Signed)
   CLINICAL SOCIAL WORK PLACEMENT  NOTE  Date:  04/10/2017  Patient Details  Name: [name] MRN: [id number] Date of Birth: [birth year]  Clinical Social Work is seeking post-discharge placement for this patient at the [place] level of care (*CSW will initial, date and re-position this form in  chart as items are completed):  Yes   Patient/family provided with [facility] Work Department's list of facilities offering this level of care within the geographic area requested by the patient (or if unable, by the patient's family).  Yes   Patient/family informed of their freedom to choose among providers that offer the needed level of care, that participate in Medicare, Medicaid or managed care program needed by the patient, have an available bed and are willing to accept the patient.  Yes   Patient/family informed of [facility]'s ownership interest in [facility] and [facility], as well as of the fact that they are under no obligation to receive care at these facilities.  PASRR submitted to EDS on       PASRR number received on       Existing PASRR number confirmed on 04/10/17     FL2 transmitted to all facilities in geographic area requested by pt/family on 04/10/17     FL2 transmitted to all facilities within larger geographic area on       Patient informed that his/her managed care company has contracts with or will negotiate with certain facilities, including the following:        Yes   Patient/family informed of bed offers received.  Patient chooses bed at [facility]     Physician recommends and patient chooses bed at      Patient to be transferred to [facility] on 04/10/17.  Patient to be transferred to facility by ptar     Patient family notified on 04/10/17 of transfer.  Name of family member notified:        PHYSICIAN Please sign FL2     Additional Comment:    _______________________________________________ Jorge Ny, LCSW 04/10/2017, 3:26 PM

## 2017-04-10 NOTE — Progress Notes (Signed)
ANTICOAGULATION CONSULT NOTE   Pharmacy Consult for Heparin Bridge to Warfarin  Indication: A Fib s/p thrombolysis for limb ischemia  Allergies  Allergen Reactions  . Ceftriaxone Sodium Other (See Comments)    Tingly feeling after given, then one hour later while vanco infusing pruritic rash  . Phenergan [Promethazine Hcl]     Hallucinations  . Sulfonamide Derivatives Hives  . Vancomycin Hcl     Rash happened 1hr after rocephin but immediately after infusing vancomycin    Patient Measurements: Height: 5\' 2"  (157.5 cm) Weight: 115 lb (52.2 kg) IBW/kg (Calculated) : 50.1 Heparin Dosing Weight: 52 kg  Vital Signs: Temp: 98.1 F (36.7 C) (05/24 0351) Temp Source: Oral (05/24 0351) BP: 114/72 (05/24 0200) Pulse Rate: 102 (05/24 0200)  Labs:  Recent Labs  04/07/17 0850 04/08/17 0701 04/08/17 1037 04/09/17 0127  04/09/17 0552 04/09/17 1522 04/10/17 0313  HGB 14.7  --  14.8 13.4  --  13.1  --   --   HCT 45.0  --  45.3 40.4  --  39.0  --   --   PLT 322  --  269 218  --  235  --   --   LABPROT 14.7 15.2  --   --   --  16.9*  --  15.9*  INR 1.15 1.19  --   --   --  1.36  --  1.27  HEPARINUNFRC  --   --  0.30  --   < > 0.24* <0.10* 0.13*  CREATININE 0.83  --   --   --   --   --   --   --   < > = values in this interval not displayed.  Estimated Creatinine Clearance: 38.5 mL/min (by C-G formula based on SCr of 0.83 mg/dL).   Medical History: Past Medical History:  Diagnosis Date  . Anemia   . Arthritis   . Dysrhythmia    hx brief AF post op hip surg  . GERD (gastroesophageal reflux disease)   . Hypertension     Medications:  Prescriptions Prior to Admission  Medication Sig Dispense Refill Last Dose  . acetaminophen (TYLENOL) 325 MG tablet Take 650 mg by mouth daily.    04/07/2017 at Unknown time  . aspirin EC 325 MG tablet Take 325 mg by mouth at bedtime.   04/07/2017 at Unknown time  . calcium carbonate (OSCAL) 1500 (600 Ca) MG TABS tablet Take 600 mg of  elemental calcium by mouth daily with breakfast.   04/07/2017 at Unknown time  . ferrous sulfate 325 (65 FE) MG tablet Take 325 mg by mouth daily with breakfast.   04/07/2017 at Unknown time  . metoprolol succinate (TOPROL-XL) 25 MG 24 hr tablet Take 25 mg by mouth at bedtime.    04/07/2017 at Unknown time  . vitamin B-12 (CYANOCOBALAMIN) 1000 MCG tablet Take 1,000 mcg by mouth daily.   04/07/2017 at Unknown time  . Vitamin D, Ergocalciferol, (DRISDOL) 50000 units CAPS capsule Take 50,000 Units by mouth every 7 (seven) days. Sundays   04/07/2017 at Unknown time  . warfarin (COUMADIN) 2.5 MG tablet Take 2.5 mg by mouth daily at 6 PM.    04/02/2017 at 2000   Scheduled:  . clopidogrel  75 mg Oral Daily  . mouth rinse  15 mL Mouth Rinse BID  . sodium chloride flush  3 mL Intravenous Q12H  . Warfarin - Pharmacist Dosing Inpatient   Does not apply q1800   Infusions:  . sodium  chloride    . heparin 800 Units/hr (04/10/17 0200)  . niCARDipine Stopped (04/08/17 2100)   PRN: sodium chloride, acetaminophen, HYDROmorphone (DILAUDID) injection, ondansetron (ZOFRAN) IV, sodium chloride flush  Assessment: Patient is a 83 yo female admitted 5/22 for IR TKAse with concomitant systemic heparin per pharmacy. Patient was on warfarin PTA for AFib, with last dose on 5/16 and admission INR of 1.19.   Hemoglobin and platelet counts have remained stable with bruising on arm noted. Heparin levels have been therapeutic at 800 units/hr IV. Plan to continue until INR is >2. Keep lower HL goal for 24 hours post-procedure.   Initial heparin level is subtherapeutic at 0.13 after heparin infusion restarted on 5/23 evening. No issues with infusion. CBC from 5/23 stable.    Goal of Therapy:  Heparin level 0.2-0.5 units/ml  INR 2-3 Monitor platelets by anticoagulation protocol: Yes   Plan:  1. Increase heparin infusion to 950 units/hr 2. Repeat heparin level at 13:00 today 3. Daily heparin level, CBC, INR 4. Monitor  signs/symptoms of bleeding  Vincenza Hews, PharmD, BCPS 04/10/2017, 4:14 AM

## 2017-04-10 NOTE — Progress Notes (Signed)
Interventional Radiology Progress Note   Discussed AC with Dr. Shon Baton office.  Agree with [facility] bridge out of hospital, with home-health to check INR at home.   Plan: Start weight-based lovenox dosing, with 5 day Rx on DC.    Once PT assess, DC home with recommendations.    Continue Plavix dosing 75mg  PO daily.   Signed,  Dulcy Fanny. Earleen Newport, DO

## 2017-04-10 NOTE — Progress Notes (Signed)
ANTICOAGULATION CONSULT NOTE   Pharmacy Consult for Enoxaparin Bridge to Warfarin  Indication: A Fib (s/p thrombolysis for limb ischemia)  Allergies  Allergen Reactions  . Ceftriaxone Sodium Other (See Comments)    Tingly feeling after given, then one hour later while vanco infusing pruritic rash  . Phenergan [Promethazine Hcl]     Hallucinations  . Sulfonamide Derivatives Hives  . Vancomycin Hcl     Rash happened 1hr after rocephin but immediately after infusing vancomycin    Patient Measurements: Height: 5\' 2"  (157.5 cm) Weight: 115 lb (52.2 kg) IBW/kg (Calculated) : 50.1 Heparin Dosing Weight: 52 kg  Vital Signs: Temp: 98.2 F (36.8 C) (05/24 0700) Temp Source: Oral (05/24 0700) BP: 142/82 (05/24 1100) Pulse Rate: 105 (05/24 1100)  Labs:  Recent Labs  04/08/17 0701  04/08/17 1037 04/09/17 0127  04/09/17 0552 04/09/17 1522 04/10/17 0313  HGB  --   < > 14.8 13.4  --  13.1  --   --   HCT  --   --  45.3 40.4  --  39.0  --   --   PLT  --   --  269 218  --  235  --   --   LABPROT 15.2  --   --   --   --  16.9*  --  15.9*  INR 1.19  --   --   --   --  1.36  --  1.27  HEPARINUNFRC  --   --  0.30  --   < > 0.24* <0.10* 0.13*  < > = values in this interval not displayed.  Estimated Creatinine Clearance: 38.5 mL/min (by C-G formula based on SCr of 0.83 mg/dL).   Medical History: Past Medical History:  Diagnosis Date  . Anemia   . Arthritis   . Dysrhythmia    hx brief AF post op hip surg  . GERD (gastroesophageal reflux disease)   . Hypertension     Medications:  Prescriptions Prior to Admission  Medication Sig Dispense Refill Last Dose  . acetaminophen (TYLENOL) 325 MG tablet Take 650 mg by mouth daily.    04/07/2017 at Unknown time  . aspirin EC 325 MG tablet Take 325 mg by mouth at bedtime.   04/07/2017 at Unknown time  . calcium carbonate (OSCAL) 1500 (600 Ca) MG TABS tablet Take 600 mg of elemental calcium by mouth daily with breakfast.   04/07/2017 at  Unknown time  . ferrous sulfate 325 (65 FE) MG tablet Take 325 mg by mouth daily with breakfast.   04/07/2017 at Unknown time  . metoprolol succinate (TOPROL-XL) 25 MG 24 hr tablet Take 25 mg by mouth at bedtime.    04/07/2017 at Unknown time  . vitamin B-12 (CYANOCOBALAMIN) 1000 MCG tablet Take 1,000 mcg by mouth daily.   04/07/2017 at Unknown time  . Vitamin D, Ergocalciferol, (DRISDOL) 50000 units CAPS capsule Take 50,000 Units by mouth every 7 (seven) days. Sundays   04/07/2017 at Unknown time  . warfarin (COUMADIN) 2.5 MG tablet Take 2.5 mg by mouth daily at 6 PM.    04/02/2017 at 2000   Scheduled:  . clopidogrel  75 mg Oral Daily  . mouth rinse  15 mL Mouth Rinse BID  . metoprolol succinate  25 mg Oral Daily  . sodium chloride flush  3 mL Intravenous Q12H  . Warfarin - Pharmacist Dosing Inpatient   Does not apply q1800   Infusions:  . sodium chloride    . heparin  950 Units/hr (04/10/17 0800)  . niCARDipine Stopped (04/08/17 2100)   PRN: sodium chloride, acetaminophen, HYDROmorphone (DILAUDID) injection, ondansetron (ZOFRAN) IV, sodium chloride flush  Assessment: Patient is a 83 yo female admitted 5/22 for IR TKAse thrombectomy (completed 5/23). Patient was on warfarin PTA for AFib, with last dose on 5/16 and admission INR of 1.19. Warfarin restarted 5/23 and patient is to have enoxaparin bridge until therapeutic.  INR today is subtherapeutic today at 1.27 as expected after first dose last night. PTA warfarin dose is 2.5mg  daily per patient discussion. Noted that patient is receiving clopidogrel 300mg  x1 tonight, followed by 75mg  daily. Enoxaparin bridge will start today and patient will be discharged on this with home health to assess INRs.   Goal of Therapy:  INR 2-3 Monitor platelets by anticoagulation protocol: Yes   Plan:  Warfarin 3mg  PO x1 again tonight  Start enoxaparin 50mg   every 12 hours until INR < 2 Stop heparin  Monitor signs/symptoms of bleeding  Demetrius Charity,  PharmD Acute Care Pharmacy Resident  Pager: [phone] 04/10/2017

## 2017-04-10 NOTE — Evaluation (Signed)
Physical Therapy Evaluation Patient Details Name: [name] MRN: [id number] DOB: 08-Feb-1928 Today's Date: 04/10/2017   History of Present Illness  83 yo admitted with critical ischemia of LLE due to clot of left fem pop BPG performed 03/25/17. s/p thrombectomy 5/23. 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 Pt reports numbness of left calf distal to foot and reports decreased proprioception and stability in standing and with gait. Pt demonstrates decreased strength, sensation, transfers, balance, function and gait who will benefit from acute therapy to maximize mobility, independence and improve quality of life. Pt currently unsafe to perform even a standing pivot without assist to be able to function at [facility] level at home and spouse cannot provide assistance for mobility. Pt will require 24hr assist at this time and family agreeable to SNF. If pt is to return home private duty assist along with [place] required. Encouraged increased mobility with nursing acutely.   HR 116-135    Follow Up Recommendations SNF;Supervision/Assistance - 24 hour    Equipment Recommendations       Recommendations for Other Services OT consult     Precautions / Restrictions Precautions Precautions: Fall      Mobility  Bed Mobility Overal bed mobility: Needs Assistance Bed Mobility: Supine to Sit;Sit to Supine     Supine to sit: Supervision;HOB elevated Sit to supine: Supervision   General bed mobility comments: pt able to transfer to EOB with assist of rail and increased time. Supervision for lines and safety with rail for return to bed  Transfers Overall transfer level: Needs assistance   Transfers: Sit to/from Stand Sit to Stand: Min assist;Mod assist         General transfer comment: initial stand from bed mod assist with socks on and feet blocked. 2nd trial with shoes min assist. Mod assist to pivot to chair. Min assist for 2  repeated standing trials from chair with armrest. Cues for hand placement, sequence and safety with all  Ambulation/Gait Ambulation/Gait assistance: Min assist Ambulation Distance (Feet): 3 Feet Assistive device: Rolling walker (2 wheeled) Gait Pattern/deviations: Shuffle;Trunk flexed;Narrow base of support   Gait velocity interpretation: Below normal speed for age/gender General Gait Details: pt with more of a steppage gait bil with hip and trunk flexed with posterior lean. Pt able to progress to walking 3' forward and back within session after repeated attempts. Requires assist for balance and safety as well as cues for sequence.   Stairs            Wheelchair Mobility    Modified Rankin (Stroke Patients Only)       Balance Overall balance assessment: Needs assistance Sitting-balance support: Feet supported;Bilateral upper extremity supported Sitting balance-Leahy Scale: Poor     Standing balance support: Bilateral upper extremity supported Standing balance-Leahy Scale: Poor                               Pertinent Vitals/Pain Pain Assessment: No/denies pain    Home Living Family/patient expects to be discharged to:: Private residence Living Arrangements: Spouse/significant other;Other relatives Available Help at Discharge: Family;Available PRN/intermittently Type of Home: House Home Access: Ramped entrance     Home Layout: One level Home Equipment: Walker - 2 wheels;Bedside commode;Tub bench;Cane - single point Additional Comments: family has ordered WC but it hasn't arrived yet    Prior Function Level of Independence: Needs assistance  Gait / Transfers Assistance Needed: pt was walking with cane until a few weeks ago but limited to grossly 100', was transferring on her own  ADL's / Homemaking Assistance Needed: daughter-in-law and niece assist with bathing, take out meals, no housekeeping is getting done, pt can do most of her dressing herself-  difficulty with shoes        Hand Dominance        Extremity/Trunk Assessment   Upper Extremity Assessment Upper Extremity Assessment: Generalized weakness    Lower Extremity Assessment Lower Extremity Assessment: Generalized weakness    Cervical / Trunk Assessment Cervical / Trunk Assessment: Kyphotic  Communication   Communication: No difficulties  Cognition Arousal/Alertness: Awake/alert Behavior During Therapy: Flat affect Overall Cognitive Status: Impaired/Different from baseline Area of Impairment: Memory;Safety/judgement;Attention                   Current Attention Level: Selective     Safety/Judgement: Decreased awareness of deficits            General Comments      Exercises     Assessment/Plan    PT Assessment Patient needs continued PT services  PT Problem List Decreased strength;Decreased mobility;Decreased safety awareness;Decreased activity tolerance;Decreased coordination;Decreased balance;Decreased knowledge of use of DME;Impaired sensation       PT Treatment Interventions Gait training;Therapeutic exercise;Patient/family education;DME instruction;Therapeutic activities;Balance training;Functional mobility training    PT Goals (Current goals can be found in the Care Plan section)  Acute Rehab PT Goals Patient Stated Goal: return home PT Goal Formulation: With patient/family Time For Goal Achievement: 04/24/17 Potential to Achieve Goals: Fair    Frequency Min 3X/week   Barriers to discharge Decreased caregiver support spouse unable to assist and family cannot provide 24hr care    Co-evaluation               AM-PAC PT "6 Clicks" Daily Activity  Outcome Measure Difficulty turning over in bed (including adjusting bedclothes, sheets and blankets)?: A Little Difficulty moving from lying on back to sitting on the side of the bed? : A Lot Difficulty sitting down on and standing up from a chair with arms (e.g., wheelchair,  bedside commode, etc,.)?: Total Help needed moving to and from a bed to chair (including a wheelchair)?: A Lot Help needed walking in hospital room?: A Lot Help needed climbing 3-5 steps with a railing? : A Lot 6 Click Score: 12    End of Session Equipment Utilized During Treatment: Gait belt;Other (comment) (post op shoe left, orthotic right) Activity Tolerance: Patient tolerated treatment well Patient left: in bed;with call bell/phone within reach;with family/visitor present Nurse Communication: Mobility status;Precautions PT Visit Diagnosis: Unsteadiness on feet (R26.81);Difficulty in walking, not elsewhere classified (R26.2);Muscle weakness (generalized) (M62.81)    Time: 3382-5053 PT Time Calculation (min) (ACUTE ONLY): 26 min   Charges:   PT Evaluation $PT Eval Moderate Complexity: 1 Procedure PT Treatments $Therapeutic Activity: 8-22 mins   PT G Codes:        Elwyn Reach, PT [phone]   [name] 04/10/2017, 2:23 PM

## 2017-04-11 DIAGNOSIS — R278 Other lack of coordination: Secondary | ICD-10-CM | POA: Diagnosis not present

## 2017-04-11 DIAGNOSIS — R262 Difficulty in walking, not elsewhere classified: Secondary | ICD-10-CM | POA: Diagnosis not present

## 2017-04-11 DIAGNOSIS — R2681 Unsteadiness on feet: Secondary | ICD-10-CM | POA: Diagnosis not present

## 2017-04-12 ENCOUNTER — Encounter (HOSPITAL_COMMUNITY)
Admission: EM | Disposition: A | Discharge status: Home / Self Care | Payer: Self-pay | Source: Home / Self Care | Attending: Internal Medicine

## 2017-04-12 ENCOUNTER — Non-Acute Institutional Stay (SKILLED_NURSING_FACILITY): Payer: Medicare Other | Admitting: Internal Medicine

## 2017-04-12 ENCOUNTER — Encounter (HOSPITAL_COMMUNITY): Payer: Self-pay

## 2017-04-12 ENCOUNTER — Inpatient Hospital Stay (HOSPITAL_COMMUNITY)
Admission: EM | Admit: 2017-04-12 | Discharge: 2017-04-17 | DRG: 908 | Disposition: A | Discharge status: Home / Self Care | Payer: Medicare Other | Attending: Internal Medicine | Admitting: Internal Medicine

## 2017-04-12 ENCOUNTER — Emergency Department (HOSPITAL_COMMUNITY): Payer: Medicare Other | Admitting: Anesthesiology

## 2017-04-12 ENCOUNTER — Emergency Department (HOSPITAL_COMMUNITY): Payer: Medicare Other

## 2017-04-12 DIAGNOSIS — R059 Cough, unspecified: Secondary | ICD-10-CM

## 2017-04-12 DIAGNOSIS — I482 Chronic atrial fibrillation, unspecified: Secondary | ICD-10-CM

## 2017-04-12 DIAGNOSIS — Z881 Allergy status to other antibiotic agents status: Secondary | ICD-10-CM

## 2017-04-12 DIAGNOSIS — I959 Hypotension, unspecified: Secondary | ICD-10-CM | POA: Diagnosis present

## 2017-04-12 DIAGNOSIS — M7981 Nontraumatic hematoma of soft tissue: Secondary | ICD-10-CM | POA: Diagnosis not present

## 2017-04-12 DIAGNOSIS — R34 Anuria and oliguria: Secondary | ICD-10-CM | POA: Diagnosis present

## 2017-04-12 DIAGNOSIS — Z79899 Other long term (current) drug therapy: Secondary | ICD-10-CM

## 2017-04-12 DIAGNOSIS — R1030 Lower abdominal pain, unspecified: Secondary | ICD-10-CM | POA: Diagnosis not present

## 2017-04-12 DIAGNOSIS — R05 Cough: Secondary | ICD-10-CM | POA: Diagnosis not present

## 2017-04-12 DIAGNOSIS — S3012XA Contusion of groin, initial encounter: Secondary | ICD-10-CM

## 2017-04-12 DIAGNOSIS — Z7901 Long term (current) use of anticoagulants: Secondary | ICD-10-CM

## 2017-04-12 DIAGNOSIS — S301XXA Contusion of abdominal wall, initial encounter: Secondary | ICD-10-CM

## 2017-04-12 DIAGNOSIS — R2681 Unsteadiness on feet: Secondary | ICD-10-CM | POA: Diagnosis not present

## 2017-04-12 DIAGNOSIS — R52 Pain, unspecified: Secondary | ICD-10-CM | POA: Diagnosis not present

## 2017-04-12 DIAGNOSIS — Z888 Allergy status to other drugs, medicaments and biological substances status: Secondary | ICD-10-CM | POA: Diagnosis not present

## 2017-04-12 DIAGNOSIS — T148XXA Other injury of unspecified body region, initial encounter: Secondary | ICD-10-CM

## 2017-04-12 DIAGNOSIS — R10819 Abdominal tenderness, unspecified site: Secondary | ICD-10-CM | POA: Diagnosis not present

## 2017-04-12 DIAGNOSIS — I97638 Postprocedural hematoma of a circulatory system organ or structure following other circulatory system procedure: Secondary | ICD-10-CM | POA: Diagnosis present

## 2017-04-12 DIAGNOSIS — D62 Acute posthemorrhagic anemia: Secondary | ICD-10-CM | POA: Diagnosis not present

## 2017-04-12 DIAGNOSIS — S40021A Contusion of right upper arm, initial encounter: Secondary | ICD-10-CM | POA: Diagnosis present

## 2017-04-12 DIAGNOSIS — I10 Essential (primary) hypertension: Secondary | ICD-10-CM | POA: Diagnosis present

## 2017-04-12 DIAGNOSIS — Z7982 Long term (current) use of aspirin: Secondary | ICD-10-CM

## 2017-04-12 DIAGNOSIS — K219 Gastro-esophageal reflux disease without esophagitis: Secondary | ICD-10-CM | POA: Diagnosis present

## 2017-04-12 DIAGNOSIS — D649 Anemia, unspecified: Secondary | ICD-10-CM | POA: Diagnosis not present

## 2017-04-12 DIAGNOSIS — Z5189 Encounter for other specified aftercare: Secondary | ICD-10-CM | POA: Diagnosis not present

## 2017-04-12 DIAGNOSIS — I998 Other disorder of circulatory system: Secondary | ICD-10-CM | POA: Diagnosis present

## 2017-04-12 DIAGNOSIS — Y838 Other surgical procedures as the cause of abnormal reaction of the patient, or of later complication, without mention of misadventure at the time of the procedure: Secondary | ICD-10-CM | POA: Diagnosis present

## 2017-04-12 DIAGNOSIS — R1909 Other intra-abdominal and pelvic swelling, mass and lump: Secondary | ICD-10-CM | POA: Diagnosis present

## 2017-04-12 DIAGNOSIS — S301XXD Contusion of abdominal wall, subsequent encounter: Secondary | ICD-10-CM | POA: Diagnosis not present

## 2017-04-12 DIAGNOSIS — Z882 Allergy status to sulfonamides status: Secondary | ICD-10-CM | POA: Diagnosis not present

## 2017-04-12 DIAGNOSIS — I9789 Other postprocedural complications and disorders of the circulatory system, not elsewhere classified: Secondary | ICD-10-CM | POA: Diagnosis not present

## 2017-04-12 DIAGNOSIS — I4891 Unspecified atrial fibrillation: Secondary | ICD-10-CM | POA: Diagnosis not present

## 2017-04-12 DIAGNOSIS — M6281 Muscle weakness (generalized): Secondary | ICD-10-CM | POA: Diagnosis not present

## 2017-04-12 DIAGNOSIS — R278 Other lack of coordination: Secondary | ICD-10-CM | POA: Diagnosis not present

## 2017-04-12 DIAGNOSIS — S7011XA Contusion of right thigh, initial encounter: Secondary | ICD-10-CM | POA: Diagnosis not present

## 2017-04-12 DIAGNOSIS — Z96642 Presence of left artificial hip joint: Secondary | ICD-10-CM | POA: Diagnosis present

## 2017-04-12 DIAGNOSIS — Z7902 Long term (current) use of antithrombotics/antiplatelets: Secondary | ICD-10-CM | POA: Diagnosis not present

## 2017-04-12 DIAGNOSIS — I739 Peripheral vascular disease, unspecified: Secondary | ICD-10-CM | POA: Diagnosis not present

## 2017-04-12 DIAGNOSIS — I97618 Postprocedural hemorrhage and hematoma of a circulatory system organ or structure following other circulatory system procedure: Secondary | ICD-10-CM | POA: Diagnosis not present

## 2017-04-12 DIAGNOSIS — R0603 Acute respiratory distress: Secondary | ICD-10-CM | POA: Diagnosis not present

## 2017-04-12 DIAGNOSIS — R262 Difficulty in walking, not elsewhere classified: Secondary | ICD-10-CM | POA: Diagnosis not present

## 2017-04-12 HISTORY — PX: FEMORAL ARTERY EXPLORATION: SHX5160

## 2017-04-12 LAB — POCT I-STAT 4, (NA,K, GLUC, HGB,HCT)
Glucose, Bld: 342 mg/dL — ABNORMAL HIGH (ref 65–99)
HCT: 31 % — ABNORMAL LOW (ref 36.0–46.0)
Hemoglobin: 10.5 g/dL — ABNORMAL LOW (ref 12.0–15.0)
Potassium: 4.8 mmol/L (ref 3.5–5.1)
Sodium: 142 mmol/L (ref 135–145)

## 2017-04-12 LAB — CBC WITH DIFFERENTIAL/PLATELET
BASOS ABS: 0 10*3/uL (ref 0.0–0.1)
BASOS PCT: 0 %
Eosinophils Absolute: 0.3 10*3/uL (ref 0.0–0.7)
Eosinophils Relative: 2 %
HEMATOCRIT: 33.1 % — AB (ref 36.0–46.0)
Hemoglobin: 10.6 g/dL — ABNORMAL LOW (ref 12.0–15.0)
LYMPHS PCT: 13 %
Lymphs Abs: 1.9 10*3/uL (ref 0.7–4.0)
MCH: 30.7 pg (ref 26.0–34.0)
MCHC: 32 g/dL (ref 30.0–36.0)
MCV: 95.9 fL (ref 78.0–100.0)
Monocytes Absolute: 0.9 10*3/uL (ref 0.1–1.0)
Monocytes Relative: 6 %
NEUTROS ABS: 11 10*3/uL — AB (ref 1.7–7.7)
NEUTROS PCT: 79 %
Platelets: 239 10*3/uL (ref 150–400)
RBC: 3.45 MIL/uL — AB (ref 3.87–5.11)
RDW: 13.4 % (ref 11.5–15.5)
WBC: 14.1 10*3/uL — AB (ref 4.0–10.5)

## 2017-04-12 LAB — BASIC METABOLIC PANEL
ANION GAP: 12 (ref 5–15)
BUN: 16 mg/dL (ref 6–20)
CO2: 22 mmol/L (ref 22–32)
Calcium: 9.1 mg/dL (ref 8.9–10.3)
Chloride: 106 mmol/L (ref 101–111)
Creatinine, Ser: 1.05 mg/dL — ABNORMAL HIGH (ref 0.44–1.00)
GFR, EST AFRICAN AMERICAN: 54 mL/min — AB (ref >60)
GFR, EST NON AFRICAN AMERICAN: 47 mL/min — AB (ref >60)
Glucose, Bld: 309 mg/dL — ABNORMAL HIGH (ref 65–99)
POTASSIUM: 3.7 mmol/L (ref 3.5–5.1)
SODIUM: 140 mmol/L (ref 135–145)

## 2017-04-12 LAB — PROTIME-INR
INR: 1.66
Prothrombin Time: 19.8 seconds — ABNORMAL HIGH (ref 11.4–15.2)

## 2017-04-12 LAB — MRSA PCR SCREENING: MRSA BY PCR: NEGATIVE

## 2017-04-12 LAB — ABO/RH: ABO/RH(D): O NEG

## 2017-04-12 LAB — PREPARE RBC (CROSSMATCH)

## 2017-04-12 SURGERY — EXPLORATION, ARTERY, FEMORAL
Anesthesia: General | Laterality: Right

## 2017-04-12 MED ORDER — GUAIFENESIN-DM 100-10 MG/5ML PO SYRP
15.0000 mL | ORAL_SOLUTION | ORAL | Status: DC | PRN
  Administered 2017-04-15: 15 mL via ORAL
  Filled 2017-04-12 (×2): qty 15

## 2017-04-12 MED ORDER — PANTOPRAZOLE SODIUM 40 MG PO TBEC
40.0000 mg | DELAYED_RELEASE_TABLET | Freq: Every day | ORAL | Status: DC
  Administered 2017-04-12 – 2017-04-17 (×6): 40 mg via ORAL
  Filled 2017-04-12 (×6): qty 1

## 2017-04-12 MED ORDER — ACETAMINOPHEN 325 MG PO TABS: 650.0000 mg | ORAL_TABLET | Freq: Every day | ORAL | Status: DC

## 2017-04-12 MED ORDER — POTASSIUM CHLORIDE CRYS ER 20 MEQ PO TBCR: 20.0000 meq | EXTENDED_RELEASE_TABLET | Freq: Once | ORAL | Status: DC | PRN

## 2017-04-12 MED ORDER — CEFUROXIME SODIUM 1.5 G IV SOLR
1.5000 g | Freq: Two times a day (BID) | INTRAVENOUS | Status: AC
  Administered 2017-04-13 (×2): 1.5 g via INTRAVENOUS
  Filled 2017-04-12 (×2): qty 1.5

## 2017-04-12 MED ORDER — MIDAZOLAM HCL 2 MG/2ML IJ SOLN
INTRAMUSCULAR | Status: AC
  Filled 2017-04-12: qty 2

## 2017-04-12 MED ORDER — EPHEDRINE 5 MG/ML INJ
INTRAVENOUS | Status: AC
  Filled 2017-04-12: qty 10

## 2017-04-12 MED ORDER — ROCURONIUM BROMIDE 10 MG/ML (PF) SYRINGE
PREFILLED_SYRINGE | INTRAVENOUS | Status: AC
  Filled 2017-04-12: qty 5

## 2017-04-12 MED ORDER — LIDOCAINE 2% (20 MG/ML) 5 ML SYRINGE
INTRAMUSCULAR | Status: AC
  Filled 2017-04-12: qty 5

## 2017-04-12 MED ORDER — SUGAMMADEX SODIUM 200 MG/2ML IV SOLN
INTRAVENOUS | Status: AC
  Filled 2017-04-12: qty 2

## 2017-04-12 MED ORDER — ACETAMINOPHEN 650 MG RE SUPP: 325.0000 mg | RECTAL | Status: DC | PRN

## 2017-04-12 MED ORDER — SUCCINYLCHOLINE CHLORIDE 20 MG/ML IJ SOLN
INTRAMUSCULAR | Status: DC | PRN
  Administered 2017-04-12: 100 mg via INTRAVENOUS

## 2017-04-12 MED ORDER — METOPROLOL TARTRATE 5 MG/5ML IV SOLN: 2.0000 mg | INTRAVENOUS | Status: DC | PRN

## 2017-04-12 MED ORDER — SODIUM CHLORIDE 0.9 % IV BOLUS (SEPSIS)
1000.0000 mL | Freq: Once | INTRAVENOUS | Status: AC
  Administered 2017-04-12: 1000 mL via INTRAVENOUS

## 2017-04-12 MED ORDER — PHENYLEPHRINE 40 MCG/ML (10ML) SYRINGE FOR IV PUSH (FOR BLOOD PRESSURE SUPPORT)
PREFILLED_SYRINGE | INTRAVENOUS | Status: AC
  Filled 2017-04-12: qty 10

## 2017-04-12 MED ORDER — DEXAMETHASONE SODIUM PHOSPHATE 10 MG/ML IJ SOLN
INTRAMUSCULAR | Status: AC
  Filled 2017-04-12: qty 1

## 2017-04-12 MED ORDER — ORAL CARE MOUTH RINSE
15.0000 mL | Freq: Two times a day (BID) | OROMUCOSAL | Status: DC
  Administered 2017-04-13 – 2017-04-17 (×4): 15 mL via OROMUCOSAL

## 2017-04-12 MED ORDER — MAGNESIUM HYDROXIDE 400 MG/5ML PO SUSP
45.0000 mL | ORAL | Status: DC
Start: 2017-04-12 — End: 2017-04-12

## 2017-04-12 MED ORDER — PHENYLEPHRINE 40 MCG/ML (10ML) SYRINGE FOR IV PUSH (FOR BLOOD PRESSURE SUPPORT)
PREFILLED_SYRINGE | INTRAVENOUS | Status: DC | PRN
  Administered 2017-04-12 (×2): 40 ug via INTRAVENOUS

## 2017-04-12 MED ORDER — LABETALOL HCL 5 MG/ML IV SOLN: 10.0000 mg | INTRAVENOUS | Status: DC | PRN

## 2017-04-12 MED ORDER — VITAMIN D (ERGOCALCIFEROL) 1.25 MG (50000 UNIT) PO CAPS
50000.0000 [IU] | ORAL_CAPSULE | ORAL | Status: DC
  Administered 2017-04-13: 50000 [IU] via ORAL
  Filled 2017-04-12: qty 1

## 2017-04-12 MED ORDER — SUCCINYLCHOLINE CHLORIDE 200 MG/10ML IV SOSY
PREFILLED_SYRINGE | INTRAVENOUS | Status: AC
  Filled 2017-04-12: qty 10

## 2017-04-12 MED ORDER — VITAMIN K1 10 MG/ML IJ SOLN: 5.0000 mg | Freq: Once | INTRAMUSCULAR | Status: DC

## 2017-04-12 MED ORDER — PHENOL 1.4 % MT LIQD
1.0000 | OROMUCOSAL | Status: DC | PRN
  Filled 2017-04-12: qty 177

## 2017-04-12 MED ORDER — LIDOCAINE-EPINEPHRINE 0.5 %-1:200000 IJ SOLN
INTRAMUSCULAR | Status: DC | PRN
  Administered 2017-04-12: 6 mL

## 2017-04-12 MED ORDER — ASPIRIN EC 325 MG PO TBEC
325.0000 mg | DELAYED_RELEASE_TABLET | Freq: Every day | ORAL | Status: DC
  Administered 2017-04-12 – 2017-04-16 (×5): 325 mg via ORAL
  Filled 2017-04-12 (×5): qty 1

## 2017-04-12 MED ORDER — LACTATED RINGERS IV SOLN
INTRAVENOUS | Status: DC | PRN
  Administered 2017-04-12: 16:00:00 via INTRAVENOUS

## 2017-04-12 MED ORDER — 0.9 % SODIUM CHLORIDE (POUR BTL) OPTIME
TOPICAL | Status: DC | PRN
  Administered 2017-04-12: 300 mL

## 2017-04-12 MED ORDER — PROTHROMBIN COMPLEX CONC HUMAN 500 UNITS IV KIT: 25.0000 [IU]/kg | PACK | Status: DC

## 2017-04-12 MED ORDER — DEXAMETHASONE SODIUM PHOSPHATE 4 MG/ML IJ SOLN
INTRAMUSCULAR | Status: DC | PRN
  Administered 2017-04-12: 4 mg via INTRAVENOUS

## 2017-04-12 MED ORDER — FERROUS SULFATE 325 (65 FE) MG PO TABS
325.0000 mg | ORAL_TABLET | Freq: Every day | ORAL | Status: DC
  Administered 2017-04-13 – 2017-04-17 (×5): 325 mg via ORAL
  Filled 2017-04-12 (×6): qty 1

## 2017-04-12 MED ORDER — CALCIUM CARBONATE 1500 (600 CA) MG PO TABS
1.0000 | ORAL_TABLET | Freq: Every day | ORAL | Status: DC
  Administered 2017-04-13: 1500 mg via ORAL
  Filled 2017-04-12 (×4): qty 1

## 2017-04-12 MED ORDER — OXYCODONE-ACETAMINOPHEN 5-325 MG PO TABS
1.0000 | ORAL_TABLET | ORAL | Status: DC | PRN
  Administered 2017-04-14: 1 via ORAL
  Filled 2017-04-12: qty 1

## 2017-04-12 MED ORDER — ETOMIDATE 2 MG/ML IV SOLN
INTRAVENOUS | Status: AC
  Filled 2017-04-12: qty 10

## 2017-04-12 MED ORDER — VITAMIN B-12 1000 MCG PO TABS
1000.0000 ug | ORAL_TABLET | Freq: Every day | ORAL | Status: DC
  Administered 2017-04-13 – 2017-04-17 (×5): 1000 ug via ORAL
  Filled 2017-04-12 (×5): qty 1

## 2017-04-12 MED ORDER — LIDOCAINE-EPINEPHRINE 0.5 %-1:200000 IJ SOLN
INTRAMUSCULAR | Status: AC
  Filled 2017-04-12: qty 1

## 2017-04-12 MED ORDER — ONDANSETRON HCL 4 MG/2ML IJ SOLN: 4.0000 mg | Freq: Four times a day (QID) | INTRAMUSCULAR | Status: DC | PRN

## 2017-04-12 MED ORDER — ONDANSETRON HCL 4 MG/2ML IJ SOLN
INTRAMUSCULAR | Status: AC
  Filled 2017-04-12: qty 2

## 2017-04-12 MED ORDER — FENTANYL CITRATE (PF) 100 MCG/2ML IJ SOLN
INTRAMUSCULAR | Status: DC | PRN
  Administered 2017-04-12: 50 ug via INTRAVENOUS

## 2017-04-12 MED ORDER — CEFAZOLIN SODIUM 1 G IJ SOLR
INTRAMUSCULAR | Status: DC | PRN
  Administered 2017-04-12: 2 g via INTRAMUSCULAR

## 2017-04-12 MED ORDER — PHENYLEPHRINE HCL 10 MG/ML IJ SOLN
INTRAVENOUS | Status: DC | PRN
  Administered 2017-04-12: 25 ug/min via INTRAVENOUS

## 2017-04-12 MED ORDER — SODIUM CHLORIDE 0.9 % IV SOLN: 10.0000 mL/h | Freq: Once | INTRAVENOUS | Status: DC

## 2017-04-12 MED ORDER — FENTANYL CITRATE (PF) 100 MCG/2ML IJ SOLN: 25.0000 ug | INTRAMUSCULAR | Status: DC | PRN

## 2017-04-12 MED ORDER — PROTAMINE SULFATE 10 MG/ML IV SOLN
25.0000 mg | INTRAVENOUS | Status: DC
  Filled 2017-04-12: qty 2.5

## 2017-04-12 MED ORDER — HYDRALAZINE HCL 20 MG/ML IJ SOLN: 5.0000 mg | INTRAMUSCULAR | Status: DC | PRN

## 2017-04-12 MED ORDER — FENTANYL CITRATE (PF) 250 MCG/5ML IJ SOLN
INTRAMUSCULAR | Status: AC
  Filled 2017-04-12: qty 5

## 2017-04-12 MED ORDER — MORPHINE SULFATE (PF) 2 MG/ML IV SOLN
2.0000 mg | INTRAVENOUS | Status: DC | PRN
  Administered 2017-04-12 (×2): 2 mg via INTRAVENOUS
  Filled 2017-04-12 (×2): qty 1

## 2017-04-12 MED ORDER — ONDANSETRON HCL 4 MG/2ML IJ SOLN
INTRAMUSCULAR | Status: DC | PRN
  Administered 2017-04-12: 4 mg via INTRAVENOUS

## 2017-04-12 MED ORDER — PROPOFOL 10 MG/ML IV BOLUS
INTRAVENOUS | Status: AC
  Filled 2017-04-12: qty 20

## 2017-04-12 MED ORDER — SODIUM CHLORIDE 0.9 % IV SOLN: 500.0000 mL | Freq: Once | INTRAVENOUS | Status: DC | PRN

## 2017-04-12 MED ORDER — ALUM & MAG HYDROXIDE-SIMETH 200-200-20 MG/5ML PO SUSP
15.0000 mL | ORAL | Status: DC | PRN
  Filled 2017-04-12: qty 30

## 2017-04-12 MED ORDER — ACETAMINOPHEN 325 MG PO TABS
325.0000 mg | ORAL_TABLET | ORAL | Status: DC | PRN
  Filled 2017-04-12: qty 2

## 2017-04-12 MED ORDER — ETOMIDATE 2 MG/ML IV SOLN
INTRAVENOUS | Status: DC | PRN
  Administered 2017-04-12: 14 mg via INTRAVENOUS

## 2017-04-12 MED ORDER — VITAMIN K1 10 MG/ML IJ SOLN
10.0000 mg | INTRAVENOUS | Status: DC
  Filled 2017-04-12: qty 1

## 2017-04-12 MED ORDER — CLOPIDOGREL BISULFATE 75 MG PO TABS
75.0000 mg | ORAL_TABLET | Freq: Every day | ORAL | Status: DC
Start: 2017-04-13 — End: 2017-04-17
  Administered 2017-04-13 – 2017-04-17 (×5): 75 mg via ORAL
  Filled 2017-04-12 (×5): qty 1

## 2017-04-12 MED ORDER — DOCUSATE SODIUM 100 MG PO CAPS
100.0000 mg | ORAL_CAPSULE | Freq: Every day | ORAL | Status: DC
  Administered 2017-04-13 – 2017-04-17 (×5): 100 mg via ORAL
  Filled 2017-04-12 (×5): qty 1

## 2017-04-12 SURGICAL SUPPLY — 43 items
ADH SKN CLS APL DERMABOND .7 (GAUZE/BANDAGES/DRESSINGS) ×1
BANDAGE ESMARK 6X9 LF (GAUZE/BANDAGES/DRESSINGS) IMPLANT
BNDG CMPR 9X6 STRL LF SNTH (GAUZE/BANDAGES/DRESSINGS)
BNDG ESMARK 6X9 LF (GAUZE/BANDAGES/DRESSINGS)
CANISTER SUCT 3000ML PPV (MISCELLANEOUS) ×3 IMPLANT
CANNULA VESSEL 3MM 2 BLNT TIP (CANNULA) ×1 IMPLANT
CLIP LIGATING EXTRA MED SLVR (CLIP) ×3 IMPLANT
CLIP LIGATING EXTRA SM BLUE (MISCELLANEOUS) ×3 IMPLANT
CUFF TOURNIQUET SINGLE 34IN LL (TOURNIQUET CUFF) IMPLANT
CUFF TOURNIQUET SINGLE 44IN (TOURNIQUET CUFF) IMPLANT
DERMABOND ADVANCED (GAUZE/BANDAGES/DRESSINGS) ×2
DERMABOND ADVANCED .7 DNX12 (GAUZE/BANDAGES/DRESSINGS) ×1 IMPLANT
DRAIN SNY 10X20 3/4 PERF (WOUND CARE) IMPLANT
DRAPE X-RAY CASS 24X20 (DRAPES) IMPLANT
ELECT REM PT RETURN 9FT ADLT (ELECTROSURGICAL) ×3
ELECTRODE REM PT RTRN 9FT ADLT (ELECTROSURGICAL) ×1 IMPLANT
EVACUATOR SILICONE 100CC (DRAIN) ×2 IMPLANT
GAUZE SPONGE 4X4 12PLY STRL (GAUZE/BANDAGES/DRESSINGS) ×3 IMPLANT
GLOVE SS BIOGEL STRL SZ 7.5 (GLOVE) ×1 IMPLANT
GLOVE SUPERSENSE BIOGEL SZ 7.5 (GLOVE) ×2
GOWN STRL REUS W/ TWL LRG LVL3 (GOWN DISPOSABLE) ×3 IMPLANT
GOWN STRL REUS W/TWL LRG LVL3 (GOWN DISPOSABLE) ×9
INSERT FOGARTY SM (MISCELLANEOUS) IMPLANT
KIT BASIN OR (CUSTOM PROCEDURE TRAY) ×3 IMPLANT
KIT ROOM TURNOVER OR (KITS) ×3 IMPLANT
NS IRRIG 1000ML POUR BTL (IV SOLUTION) ×4 IMPLANT
PACK PERIPHERAL VASCULAR (CUSTOM PROCEDURE TRAY) ×3 IMPLANT
PAD ARMBOARD 7.5X6 YLW CONV (MISCELLANEOUS) ×6 IMPLANT
PADDING CAST COTTON 6X4 STRL (CAST SUPPLIES) IMPLANT
SET COLLECT BLD 21X3/4 12 (NEEDLE) IMPLANT
STAPLER VISISTAT 35W (STAPLE) IMPLANT
STOPCOCK 4 WAY LG BORE MALE ST (IV SETS) IMPLANT
SUT ETHILON 3 0 PS 1 (SUTURE) ×2 IMPLANT
SUT PROLENE 5 0 C 1 24 (SUTURE) ×5 IMPLANT
SUT PROLENE 6 0 CC (SUTURE) ×1 IMPLANT
SUT SILK 2 0 SH (SUTURE) ×1 IMPLANT
SUT VIC AB 2-0 CTX 36 (SUTURE) ×2 IMPLANT
SUT VIC AB 3-0 SH 27 (SUTURE) ×6
SUT VIC AB 3-0 SH 27X BRD (SUTURE) ×2 IMPLANT
TRAY FOLEY W/METER SILVER 16FR (SET/KITS/TRAYS/PACK) ×3 IMPLANT
TUBING EXTENTION W/L.L. (IV SETS) IMPLANT
UNDERPAD 30X30 (UNDERPADS AND DIAPERS) ×3 IMPLANT
WATER STERILE IRR 1000ML POUR (IV SOLUTION) ×1 IMPLANT

## 2017-04-12 NOTE — ED Notes (Signed)
Pt from [facility], not [facility].

## 2017-04-12 NOTE — Anesthesia Preprocedure Evaluation (Addendum)
Anesthesia Evaluation  Patient identified by MRN, date of birth, ID band Patient awake    Reviewed: Allergy & Precautions, NPO status , Patient's Chart, lab work & pertinent test results, Unable to perform ROS - Chart review onlyPreop documentation limited or incomplete due to emergent nature of procedure.  History of Anesthesia Complications Negative for: history of anesthetic complications  Airway Mallampati: II  TM Distance: >3 FB Neck ROM: Full    Dental  (+) Caps, Dental Advisory Given   Pulmonary neg pulmonary ROS,    breath sounds clear to auscultation       Cardiovascular hypertension, Pt. on home beta blockers and Pt. on medications + Peripheral Vascular Disease  + dysrhythmias Atrial Fibrillation  Rhythm:Irregular Rate:Tachycardia  Hypotensive in ED with expanding hematoma R groin 2u PRBC infusing from ED   Neuro/Psych    GI/Hepatic Neg liver ROS, GERD  Controlled,  Endo/Other  negative endocrine ROS  Renal/GU negative Renal ROS     Musculoskeletal  (+) Arthritis ,   Abdominal   Peds  Hematology Coumadin, plavix, aspirin, lovenox: INR 1.66, plts 239K   Anesthesia Other Findings   Reproductive/Obstetrics                            Anesthesia Physical Anesthesia Plan  ASA: III and emergent  Anesthesia Plan: General   Post-op Pain Management:    Induction: Intravenous, Rapid sequence and Cricoid pressure planned  Airway Management Planned: Oral ETT  Additional Equipment:   Intra-op Plan:   Post-operative Plan: Possible Post-op intubation/ventilation  Informed Consent:   Only emergency history available and Dental advisory given  Plan Discussed with: CRNA and Surgeon  Anesthesia Plan Comments: (Plan routine monitors, GETA, possible post op ventilation)        Anesthesia Quick Evaluation

## 2017-04-12 NOTE — Transfer of Care (Signed)
Immediate Anesthesia Transfer of Care Note  Patient: [name]  Procedure(s) Performed: Procedure(s): EVACUATION HEMATOMA RIGHT GROIN WITH FEMORAL ARTERY REPAIR (Right)  Patient Location: PACU  Anesthesia Type:General  Level of Consciousness: awake, oriented, drowsy and patient cooperative  Airway & Oxygen Therapy: Patient connected to face mask oxygen  Post-op Assessment: Report given to RN and Post -op Vital signs reviewed and stable  Post vital signs: Reviewed  Last Vitals:  Vitals:   04/12/17 1425 04/12/17 1515  BP: (!) 101/48 102/65  Pulse: 82 (!) 35  Resp: (!) 30 (!) 22  Temp: 36.3 C     Last Pain:  Vitals:   04/12/17 1425  TempSrc: Oral         Complications: No apparent anesthesia complications

## 2017-04-12 NOTE — H&P (Signed)
Name: [name] MRN: [id number] DOB: [birth year]    LOS: 0  Referring Provider:  Dr. Donnetta Hutching Reason for Referral:  Limb ischemia  PULMONARY / CRITICAL CARE MEDICINE  HPI:  [name] is an 83 y/o woman who was admitted to the ICU after evacuation of a hematoma arising from the femoral artery.  She underwent catheter directed thrombolysis of the left LE on 5/22 and 5/23.  She then presented to the [facility] ED from her nursing home on 5/26 with a large inguinal hematoma.  She underwent evacuation of the hematoma and repair of the femoral artery.  PCCM was asked to admit her after her procedure.   Past Medical History:  Diagnosis Date  . Anemia   . Arthritis   . Dysrhythmia    hx brief AF post op hip surg  . GERD (gastroesophageal reflux disease)   . Hypertension    Past Surgical History:  Procedure Laterality Date  . AMPUTATION Right 02/04/2013   Procedure: [name] ;  Surgeon: Wylene Simmer, MD;  Location: [place];  Service: Orthopedics;  Laterality: Right;  . APPENDECTOMY    . COLONOSCOPY    . EYE SURGERY     both cataracts  . INCISION AND DRAINAGE  2011   left hip inf-hemovac  . IR ANGIOGRAM EXTREMITY LEFT  03/25/2017  . IR ANGIOGRAM EXTREMITY LEFT  04/08/2017  . IR ANGIOGRAM SELECTIVE EACH ADDITIONAL VESSEL  03/25/2017  . IR FEM POP ART STENT INC PTA MOD SED  03/25/2017  . IR INFUSION THROMBOL ARTERIAL INITIAL (MS)  04/08/2017  . IR THROMB F/U EVAL ART/VEN FINAL DAY (MS)  04/09/2017  . IR TIB-PERO ART PTA MOD SED  03/25/2017  . IR US GUIDE VASC ACCESS RIGHT  03/25/2017  . IR US GUIDE VASC ACCESS RIGHT  04/08/2017  . KNEE ARTHROSCOPY  1995   left  . NECK MASS EXCISION    . TONSILLECTOMY    . TOTAL HIP ARTHROPLASTY  2006   left-fx  . TOTAL KNEE ARTHROPLASTY  [id number]   left  . TOTAL KNEE ARTHROPLASTY  2009   right   Prior to Admission medications   Medication Sig Start Date End Date Taking? Authorizing Provider  acetaminophen (TYLENOL) 325 MG tablet  Take 650 mg by mouth daily.    Yes [provider]  aspirin EC 325 MG tablet Take 325 mg by mouth at bedtime.   Yes [provider]  calcium carbonate (OSCAL) 1500 (600 Ca) MG TABS tablet Take 1 tablet by mouth daily with breakfast.    Yes [provider]  clopidogrel (PLAVIX) 75 MG tablet Take 1 tablet (75 mg total) by mouth daily. 04/11/17  Yes Saverio Danker, PA-C  enoxaparin (LOVENOX) 60 MG/0.6ML injection Inject 0.5 mLs (50 mg total) into the skin every 12 (twelve) hours. 04/10/17 04/15/17 Yes Saverio Danker, PA-C  ferrous sulfate 325 (65 FE) MG tablet Take 325 mg by mouth daily with breakfast.   Yes [provider]  magnesium hydroxide (MILK OF MAGNESIA) 400 MG/5ML suspension Take 45 mLs by mouth See admin instructions. AS NEEDED FOR 2 DAYS TO STOP ON 04/13/17   Yes [provider]  metoprolol succinate (TOPROL-XL) 25 MG 24 hr tablet Take 25 mg by mouth at bedtime.    Yes [provider]  vitamin B-12 (CYANOCOBALAMIN) 1000 MCG tablet Take 1,000 mcg by mouth daily.   Yes [provider]  warfarin (COUMADIN) 3 MG tablet Take 3 mg by mouth  daily at 6 PM. FOR A-FIB   Yes [provider]  Vitamin D, Ergocalciferol, (DRISDOL) 50000 units CAPS capsule Take 50,000 Units by mouth every 7 (seven) days. Sundays    [provider]   Allergies Allergies  Allergen Reactions  . Amprenavir Other (See Comments)    LISTED ON PATIENT'S MAR  . Ceftriaxone Sodium Other (See Comments)    Tingly feeling after given, then one hour later while vanco infusing pruritic rash  . Phenergan [Promethazine Hcl] Other (See Comments)    Hallucinations  . Sulfonamide Derivatives Hives  . Vancomycin Hcl Rash    Rash happened 1hr after rocephin but immediately after infusing vancomycin    Family History Family History  Problem Relation Age of Onset  . Cancer Mother    Social History  reports that she has never smoked. She has never used  smokeless tobacco. She reports that she does not drink alcohol or use drugs.  Review Of Systems:  A review of 14 systems was negative except as stated in the HPI.   Brief patient description:  83 y/o pt with large inguinal hematoma requiring evacuation  Events Since Admission:   Current Status:  Vital Signs: Temp:  [97.3 F (36.3 C)-97.9 F (36.6 C)] 97.9 F (36.6 C) (05/26 2025) Pulse Rate:  [35-124] 124 (05/26 1853) Resp:  [16-30] 22 (05/26 1853) BP: (101-184)/(48-119) 184/119 (05/26 1729) SpO2:  [78 %-100 %] 96 % (05/26 1853) Weight:  [50.8 kg (112 lb)] 50.8 kg (112 lb) (05/26 1425)  Physical Examination: General:  Elderly woman laying in bed in NAD Neuro:  Sleepy but arousable to voice HEENT:  PERRL, EOMI, OP clear Neck:  Trachea midline Cardiovascular:  Irregularly irregular, no MRG Lungs:  CTAB, no wrr Abdomen:  Soft, NTND, +BS Musculoskeletal:  Inguinal site dressing cdi Skin:  Multiple bruises  Active Problems:   Groin swelling   Hematoma of groin   ASSESSMENT AND PLAN  PULMONARY No results for input(s): PHART, PCO2ART, PO2ART, HCO3, O2SAT in the last 168 hours.  Invalid input(s): PCO2 Ventilator Settings:     A:  Intubated for procedure P:   Extubated post-op  CARDIOVASCULAR   A: A-fib P:  Coagulation reversed prior to surgery - restart when approved by vascular   HEMATOLOGIC  Recent Labs Lab 04/07/17 0850 04/08/17 0701 04/08/17 1037 04/09/17 0127 04/09/17 0552 04/10/17 0313 04/12/17 1452 04/12/17 1628  HGB 14.7  --  14.8 13.4 13.1  --  10.6* 10.5*  HCT 45.0  --  45.3 40.4 39.0  --  33.1* 31.0*  PLT 322  --  269 218 235  --  239  --   INR 1.15 1.19  --   --  1.36 1.27 1.66  --    A:  At risk of bleeding P:  Serial H&H Transfused 2 units prior to procedure   Lincoln Maxin, Mariann Laster., M.D. Pulmonary and [place] Pager: [phone]  04/12/2017, 10:25 PM

## 2017-04-12 NOTE — ED Provider Notes (Signed)
[facility] DEPT Provider Note   CSN: [id number] Arrival date & time: 04/12/17  1412     History   Chief Complaint Chief Complaint  Patient presents with  . Groin Pain    HPI [name] is a 83 y.o. female.  HPI   Pt with hx chronic afib, recent intervention for acute ischemic extremity currently on coumadin, lovenox, and plavix p/w large swelling and bruising of the right inguinal area, bruising of the right arm.  She has nausea, and the sensation of needing to urinate and defecate.  Denies any falls, weakness or numbness of the extremities, chest or abdominal pain, SOB.    Past Medical History:  Diagnosis Date  . Anemia   . Arthritis   . Dysrhythmia    hx brief AF post op hip surg  . GERD (gastroesophageal reflux disease)   . Hypertension     Patient Active Problem List   Diagnosis Date Noted  . Groin swelling 04/12/2017  . Hematoma of groin 04/12/2017  . Critical lower limb ischemia 04/08/2017  . Long term (current) use of anticoagulants [Z79.01] 12/13/2015  . PAD (peripheral artery disease) ([place]) 10/05/2015  . ULCER OF OTHER PART OF FOOT 06/06/2010  . ESSENTIAL HYPERTENSION 03/26/2010  . ATRIAL FIBRILLATION 03/26/2010  . CUTANEOUS ERUPTIONS, DRUG-INDUCED 03/26/2010  . DIARRHEA 03/26/2010  . GERD 03/20/2010  . UNSPEC LOCAL INFECTION SKIN&SUBCUTANEOUS TISSUE 03/20/2010  . OSTEOARTHRITIS 03/20/2010  . ARTHRITIS, SEPTIC 03/14/2010  . OTH COMPLICATIONS DUE INTERNAL JOINT PROSTHESIS 02/22/2010    Past Surgical History:  Procedure Laterality Date  . AMPUTATION Right 02/04/2013   Procedure: [name] ;  Surgeon: Wylene Simmer, MD;  Location: [place];  Service: Orthopedics;  Laterality: Right;  . APPENDECTOMY    . COLONOSCOPY    . EYE SURGERY     both cataracts  . INCISION AND DRAINAGE  2011   left hip inf-hemovac  . IR ANGIOGRAM EXTREMITY LEFT  03/25/2017  . IR ANGIOGRAM EXTREMITY LEFT  04/08/2017  . IR ANGIOGRAM SELECTIVE  EACH ADDITIONAL VESSEL  03/25/2017  . IR FEM POP ART STENT INC PTA MOD SED  03/25/2017  . IR INFUSION THROMBOL ARTERIAL INITIAL (MS)  04/08/2017  . IR THROMB F/U EVAL ART/VEN FINAL DAY (MS)  04/09/2017  . IR TIB-PERO ART PTA MOD SED  03/25/2017  . IR US GUIDE VASC ACCESS RIGHT  03/25/2017  . IR US GUIDE VASC ACCESS RIGHT  04/08/2017  . KNEE ARTHROSCOPY  1995   left  . NECK MASS EXCISION    . TONSILLECTOMY    . TOTAL HIP ARTHROPLASTY  2006   left-fx  . TOTAL KNEE ARTHROPLASTY  [id number]   left  . TOTAL KNEE ARTHROPLASTY  2009   right    OB History    No data available       Home Medications    Prior to Admission medications   Medication Sig Start Date End Date Taking? Authorizing Provider  acetaminophen (TYLENOL) 325 MG tablet Take 650 mg by mouth daily.    Yes [provider]  aspirin EC 325 MG tablet Take 325 mg by mouth at bedtime.   Yes [provider]  calcium carbonate (OSCAL) 1500 (600 Ca) MG TABS tablet Take 1 tablet by mouth daily with breakfast.    Yes [provider]  clopidogrel (PLAVIX) 75 MG tablet Take 1 tablet (75 mg total) by mouth daily. 04/11/17  Yes Saverio Danker, PA-C  enoxaparin (LOVENOX) 60 MG/0.6ML injection  Inject 0.5 mLs (50 mg total) into the skin every 12 (twelve) hours. 04/10/17 04/15/17 Yes Saverio Danker, PA-C  ferrous sulfate 325 (65 FE) MG tablet Take 325 mg by mouth daily with breakfast.   Yes [provider]  magnesium hydroxide (MILK OF MAGNESIA) 400 MG/5ML suspension Take 45 mLs by mouth See admin instructions. AS NEEDED FOR 2 DAYS TO STOP ON 04/13/17   Yes [provider]  metoprolol succinate (TOPROL-XL) 25 MG 24 hr tablet Take 25 mg by mouth at bedtime.    Yes [provider]  vitamin B-12 (CYANOCOBALAMIN) 1000 MCG tablet Take 1,000 mcg by mouth daily.   Yes [provider]  warfarin (COUMADIN) 3 MG tablet Take 3 mg by mouth daily at 6 PM. FOR A-FIB   Yes [provider]  Vitamin  D, Ergocalciferol, (DRISDOL) 50000 units CAPS capsule Take 50,000 Units by mouth every 7 (seven) days. Sundays    [provider]    Family History Family History  Problem Relation Age of Onset  . Cancer Mother     Social History Social History  Substance Use Topics  . Smoking status: Never Smoker  . Smokeless tobacco: Never Used  . Alcohol use No     Allergies   Amprenavir; Ceftriaxone sodium; Phenergan [promethazine hcl]; Sulfonamide derivatives; and Vancomycin hcl   Review of Systems Review of Systems  All other systems reviewed and are negative.    Physical Exam Updated Vital Signs BP (!) 184/119   Pulse (!) 124   Temp 97.8 F (36.6 C)   Resp (!) 22   Ht 5\' 2"  (1.575 m)   Wt 50.8 kg (112 lb)   SpO2 96%   BMI 20.49 kg/m   Physical Exam  Constitutional: She appears well-developed and well-nourished. No distress.  HENT:  Head: Normocephalic and atraumatic.  Neck: Neck supple.  Cardiovascular: An irregular rhythm present. Tachycardia present.   Pulmonary/Chest: Effort normal and breath sounds normal. No respiratory distress. She has no wheezes. She has no rales.  Abdominal: Soft. She exhibits no distension. There is no tenderness. There is no rebound and no guarding.  Genitourinary:  Genitourinary Comments: Right inguinal area with large tense hematoma, tender to palpation.  Please see photo below.  Right arm with diffuse ecchymosis.    Neurological: She is alert.  Skin: She is not diaphoretic.  Nursing note and vitals reviewed.      ED Treatments / Results  Labs (all labs ordered are listed, but only abnormal results are displayed) Labs Reviewed  BASIC METABOLIC PANEL - Abnormal; Notable for the following:       Result Value   Glucose, Bld 309 (*)    Creatinine, Ser 1.05 (*)    GFR calc non Af Amer 47 (*)    GFR calc Af Amer 54 (*)    All other components within normal limits  CBC WITH DIFFERENTIAL/PLATELET - Abnormal; Notable for the  following:    WBC 14.1 (*)    RBC 3.45 (*)    Hemoglobin 10.6 (*)    HCT 33.1 (*)    Neutro Abs 11.0 (*)    All other components within normal limits  PROTIME-INR - Abnormal; Notable for the following:    Prothrombin Time 19.8 (*)    All other components within normal limits  POCT I-STAT 4, (NA,K, GLUC, HGB,HCT) - Abnormal; Notable for the following:    Glucose, Bld 342 (*)    HCT 31.0 (*)    Hemoglobin 10.5 (*)  All other components within normal limits  MRSA PCR SCREENING  CBC  BASIC METABOLIC PANEL  TYPE AND SCREEN  PREPARE RBC (CROSSMATCH)  ABO/RH    EKG  EKG Interpretation None       Radiology Ct Abdomen Pelvis Wo Contrast  Result Date: 04/12/2017 CLINICAL DATA:  Right inguinal swelling. EXAM: CT ABDOMEN AND PELVIS WITHOUT CONTRAST TECHNIQUE: Multidetector CT imaging of the abdomen and pelvis was performed following the standard protocol without IV contrast. COMPARISON:  None. FINDINGS: Lower chest: Lung bases are clear. Aortic atherosclerosis noted. Calcifications in the RCA and LAD coronary artery noted. Hepatobiliary: No focal liver abnormality. Multiple stones identified within the gallbladder. These measure up to 1.2 cm. No gallbladder wall thickening or biliary dilatation. Pancreas: Unremarkable. No pancreatic ductal dilatation or surrounding inflammatory changes. Spleen: Geographic area of low attenuation involving the inferior spleen is identified, new from 03/04/2017. Adrenals/Urinary Tract: The adrenal glands appear normal. Unremarkable appearance of the kidneys. The urinary bladder is within normal limits. Stomach/Bowel: Stomach is within normal limits. Appendix appears normal. No evidence of bowel wall thickening, distention, or inflammatory changes. Vascular/Lymphatic: Aortic atherosclerosis. No aneurysm. No upper abdominal or pelvic adenopathy. Reproductive: The uterus is unremarkable. Left adnexal cyst is again noted measuring 5.3 cm and 7 HU. Other: There is a  large well-circumscribed mass within the left inguinal region which extends laterally into the soft tissues overlying the lateral pelvis. The ventral component measures 3.9 x 8.4 by 9.2 cm. The more hyperdense lateral component Measures 8.7 x 4.1 by 7.5 cm. There is diffuse stranding within the surrounding soft tissues. Musculoskeletal: Previous left hip arthroplasty. Suggestion of right hip avascular necrosis, image 81 of series 8. There is degenerative disc disease noted throughout the lumbar spine. Compression fractures are noted at T11 and L5. IMPRESSION: 1. Examination is positive for large complex mass involving the ventral aspect of the right inguinal region and extending laterally to overly the right iliac bone. This is favored to represent a large acute and subacute hematoma. 2. 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 3.  Aortic Atherosclerosis (ICD10-I70.0). 4. Thoracic and lumbar compression deformities. 5. Gallstones These results were called by telephone at the time of interpretation on 04/12/2017 at 4:02 pm to Dr. Damita Dunnings , who verbally acknowledged these results. Electronically Signed   By: Kerby Moors M.D.   On: 04/12/2017 16:03    Procedures Procedures (including critical care time)  Medications Ordered in ED Medications  aspirin EC tablet 325 mg (not administered)  calcium carbonate (OSCAL) tablet 1,500 mg (not administered)  clopidogrel (PLAVIX) tablet 75 mg (not administered)  ferrous sulfate tablet 325 mg (not administered)  vitamin B-12 (CYANOCOBALAMIN) tablet 1,000 mcg (not administered)  Vitamin D (Ergocalciferol) (DRISDOL) capsule 50,000 Units (not administered)  0.9 %  sodium chloride infusion (not administered)  potassium chloride SA (K-DUR,KLOR-CON) CR tablet 20-40 mEq (not administered)  cefUROXime (ZINACEF) 1.5 g in dextrose 5 % 50 mL IVPB (not administered)  acetaminophen (TYLENOL) tablet 325-650 mg (not administered)    Or  acetaminophen (TYLENOL) suppository 325-650 mg  (not administered)  docusate sodium (COLACE) capsule 100 mg (not administered)  ondansetron (ZOFRAN) injection 4 mg (not administered)  alum & mag hydroxide-simeth (MAALOX/MYLANTA) 200-200-20 MG/5ML suspension 15-30 mL (not administered)  pantoprazole (PROTONIX) EC tablet 40 mg (not administered)  labetalol (NORMODYNE,TRANDATE) injection 10 mg (not administered)  hydrALAZINE (APRESOLINE) injection 5 mg (not administered)  metoprolol tartrate (LOPRESSOR) injection 2-5 mg (not administered)  guaiFENesin-dextromethorphan (ROBITUSSIN DM) 100-10 MG/5ML syrup 15 mL (not administered)  phenol (CHLORASEPTIC) mouth spray 1 spray (not administered)  oxyCODONE-acetaminophen (PERCOCET/ROXICET) 5-325 MG per tablet 1-2 tablet (not administered)  morphine 2 MG/ML injection 2-5 mg (2 mg Intravenous Given 04/12/17 1851)  sodium chloride 0.9 % bolus 1,000 mL (1,000 mLs Intravenous New Bag/Given 04/12/17 1521)     Initial Impression / Assessment and Plan / ED Course  I have reviewed the triage vital signs and the nursing notes.  Pertinent labs & imaging results that were available during my care of the patient were reviewed by me and considered in my medical decision making (see chart for details).  Clinical Course as of Apr 12 1952  Sat Apr 12, 2017  1515 Discussed pt with Dr Thomasene Lot who will also see and examine the patient.    [EW]    Clinical Course User Index [EW] Clayton Bibles, [place]    Pt on multiple blood thinners with recent procedures by IR for acute ischemic left lower extremity p/w acute right inguinal hematoma.  Pt tachycardic and hypotensive in ED.  Labs, CT ordered.  Labs with Hgb 10.6, INR 1.6.  CT demonstrating large expanding hematoma.  Discussed pt with Dr Thomasene Lot who spoke with IR and vascular surgery, who both saw patient in ED.  Please see her note for further details. Pt taken to operating room from ED.    Final Clinical Impressions(s) / ED Diagnoses   Final diagnoses:  Hematoma     New Prescriptions Current Discharge Medication List       Clayton Bibles, Hershal Coria 04/12/17 1954    Macarthur Critchley, MD 05/01/17 [id number]

## 2017-04-12 NOTE — Progress Notes (Signed)
Faciltiy; [facility]   04/12/17  CC;  Right groin swelling  History; I was asked to look at this patient today because of swelling in her right inguinal area.. She is a patient who was revascularized in a 2-stage procedure by Dr. Earleen Newport from 5/22-5/23. She had catheter directed thrombolysis . I am not really able to follow the details of the procedure however she apparently had good restoration of flow to her threatened left foot. The patient is on chronic Coumadin because of a history of atrial fibrillation. It was felt that she needed to bridge with Lovenox and restarting her Coumadin. She is also started on Plavix. She is also on aspirin 325. Her INR yesterday was 1.3 on 2.5 mg of Coumadin. The Coumadin was increased to 3 mg. She is on Lovenox 50 subcutaneous every 12  The patient was up sitting for lunch and she felt a tearing pain in her left groin. The pain was severe. They put her back in bed her vital signs were checked and were reasonably stable. However she now has a large swelling above her right inguinal area v. This is firm and tender.  Current Outpatient Prescriptions on File Prior to Visit  Medication Sig Dispense Refill  . acetaminophen (TYLENOL) 325 MG tablet Take 650 mg by mouth daily.     Marland Kitchen aspirin EC 325 MG tablet Take 325 mg by mouth at bedtime.    . calcium carbonate (OSCAL) 1500 (600 Ca) MG TABS tablet Take 600 mg of elemental calcium by mouth daily with breakfast.    . clopidogrel (PLAVIX) 75 MG tablet Take 1 tablet (75 mg total) by mouth daily. 30 tablet 1  . enoxaparin (LOVENOX) 60 MG/0.6ML injection Inject 0.5 mLs (50 mg total) into the skin every 12 (twelve) hours. 5 mL 0  . ferrous sulfate 325 (65 FE) MG tablet Take 325 mg by mouth daily with breakfast.    . metoprolol succinate (TOPROL-XL) 25 MG 24 hr tablet Take 25 mg by mouth at bedtime.     . vitamin B-12 (CYANOCOBALAMIN) 1000 MCG tablet Take 1,000 mcg by mouth daily.    . Vitamin D, Ergocalciferol, (DRISDOL)  50000 units CAPS capsule Take 50,000 Units by mouth every 7 (seven) days. Sundays    . warfarin (COUMADIN) 3.0 MG tablet Take 3.0 mg by mouth daily at 6 PM.      Review of systems Gen. patient is complaining of pain in her left groin Respiratory no shortness of breath Cardiac no chest pain GI no abdominal pain Extremities states her left leg  Physical examination Gen. the patient is ashen and looks somewhat uncomfortable however vital signs are stable pulse is 99 blood pressure 130/80 O2 sat 90% on room air. Cardiac heart sounds atrial fib no murmurs. She appears to be euvolemic Respiratory air entry is clear bilaterally Abdomen soft no masses Right inguinal area. There is some old blood in her upper thigh and groin however a massive swelling over this area tender but nonpulsatile.  Impression/plan #1 hematoma. Nobody has seen this previously. History suggests bleeding//acute hematoma formation. Her vitals are stable however she looks pale #2 chronic atrial fibrillation on Coumadin. Her pulse rate is controlled. INR yesterday was 1.3 and her Coumadin was increased to 3 mg and she had that this morning. However she is also on Lovenox Plavix and aspirin which all could be contributing to the presentation.  The patient will be sent to [facility] ER for evaluation

## 2017-04-12 NOTE — ED Notes (Signed)
Chaplain paged  

## 2017-04-12 NOTE — Progress Notes (Signed)
Nursing secretary paged Chaplain to come be with family whose family member is rapidly getting sick.  Several family members present and around the bed along with staff and MD's.  Room is pretty crowded as patient's condition is seemingly worsening.  Chaplain introduced herself to two family members of which one was a son.  Family is appreciative of Chaplain support.  Patient is heading to OR, Chaplain will remain available for this family.  Please page Chaplain as support is available for this family. [phone]    04/12/17 1600  Clinical Encounter Type  Visited With Family  Visit Type Initial;Spiritual support;Pre-op;ED  Referral From Other (Comment) (Nursing Secretary)  Consult/Referral To Chaplain  Spiritual Encounters  Spiritual Needs Prayer;Emotional  Stress Factors  Patient Stress Factors Health changes  Family Stress Factors Health changes

## 2017-04-12 NOTE — Progress Notes (Signed)
eLink Physician-Brief Progress Note Patient Name: [name] DOB: 08/18/1928 MRN: [id number]   Date of Service  04/12/2017  HPI/Events of Note  83 yr old female with PVD s/p stent placement in right lower ext below the knee on 03/25/17.  On 5/22 presented with acute thrombosis of stent and arterial thrombolysis was performed.    Today presented with lower ext pain and swelling at prior cath site.  Received 2 units of uncrossmatched blood and taken to OR.  Patient had evacuation of hematoma and repair of bleeding site.  Now presents to ICU post-op extubated and stable from hemodynamic and resp standpoint.  We have been asked to admit.   eICU Interventions  anticoag and antiplatelet medication per vasc surgery Serial H/H Observe in ICU overnight Supportive care     Intervention Category Evaluation Type: New Patient Evaluation  Mauri Brooklyn, P 04/12/2017, 7:07 PM

## 2017-04-12 NOTE — ED Notes (Signed)
2 units of emergency blood administered.

## 2017-04-12 NOTE — ED Notes (Signed)
Contacted pharmacy about [name] and Vitamin K

## 2017-04-12 NOTE — Anesthesia Procedure Notes (Deleted)
Procedures

## 2017-04-12 NOTE — Anesthesia Procedure Notes (Signed)
Procedure Name: Intubation Date/Time: 04/12/2017 4:17 PM Performed by: Marchelle Folks ANN Pre-anesthesia Checklist: Patient identified, Emergency Drugs available, Suction available, Patient being monitored and Timeout performed Patient Re-evaluated:Patient Re-evaluated prior to inductionOxygen Delivery Method: Circle system utilized, Simple face mask and Non-rebreather mask Preoxygenation: Pre-oxygenation with 100% oxygen Intubation Type: IV induction and Cricoid Pressure applied Laryngoscope Size: Mac and 3 Grade View: Grade I Tube type: Subglottic suction tube Tube size: 7.5 mm Number of attempts: 1 Airway Equipment and Method: Stylet Placement Confirmation: ETT inserted through vocal cords under direct vision,  positive ETCO2 and CO2 detector Secured at: 21 cm Tube secured with: Tape

## 2017-04-12 NOTE — Op Note (Signed)
    OPERATIVE REPORT  DATE OF SURGERY: 04/12/2017  PATIENT: [name], 83 y.o. female MRN: [id number]  DOB: [birth year]  PRE-OPERATIVE DIAGNOSIS: Large acute hematoma right groin status post peripheral arterial catheterization 04/08/2017  POST-OPERATIVE DIAGNOSIS:  Same  PROCEDURE: Evacuation of hematoma. Control bleeding from right femoral artery  SURGEON:  Curt Jews, M.D.  PHYSICIAN ASSISTANT: Nurse  ANESTHESIA:  Gen.  EBL: 500 ml  Total I/O In: -  Out: 500 [Blood:500]  BLOOD ADMINISTERED: 2 units of emergency release in the ER on the way to the OR  DRAINS: Blake drain and right groin  SPECIMEN: None  COUNTS CORRECT:  YES  PLAN OF CARE: PACU   PATIENT DISPOSITION:  PACU - hemodynamically stable  PROCEDURE DETAILS: Patient is 83 year old female who had nonhealing wound on her left foot. Underwent arteriography and intervention by interventional radiology on 03/25/2017 stenting in her SFA and popliteal and angioplasty of her distal posterior tibial artery. She had Idabell Picking reocclusion of this and on 04/08/2017 was taken back to the interventional suite. Again via the right groin and a crossover she had lysis of the stent and reinnervation. This revealed that the posterior tibial was occluded distally where it had been opened. She had been discharged home. She presents to emergency room with an extremely large expanding hematoma in her right groin and in the some respiratory distress with hypotension. I was consult to do. Patient had multiple family members present as well. Apparently she did have a DO NOT RESUSCITATE order but in discussing this with family wished all options to be attempted. Understood that she would be intubated for surgery and may have some difficulty in a summation as well. She was taken immediately to the operating room for resuscitation and repair  Patient was placed in supine position and the area the right groin was prepped and draped in usual sterile  fashion. Incision was made over the groin and a large hematoma was entered. There was fresh red blood present and also old blood. The patient did obtain a CT scan in the emergency room and this did show 5 hematoma anterior to the femoral artery and also extending around laterally into the buttock. Digital control over the femoral artery was used to control bleeding from the femoral artery. Dissection down to the femoral artery revealed the single puncture and the artery with no evidence of injury to the femoral artery. Several 5-0 Prolene sutures were used to control bleeding from the femoral artery. The hematoma space was irrigated with saline. A flat Blake drain was brought to a separate stab thigh. The hematoma extended down in the medial thigh and then around to the lateral buttock. The drain was positioned the base of this and was hooked to bulb suction. The wound was closed with 3-0 Vicryl in subcutaneous tissue in several layers. This was quite friable due to the large hematoma in the fat. The skin was closed with 3-0 subcuticular Vicryl suture. Sterile dressing was applied the patient was transferred to the recovery room stable condition. I did discuss the case with critical care medicine and they have agreed to admit her to critical care medicine service with vascular following.   Rosetta Posner, M.D., [facility] 04/12/2017 4:56 PM

## 2017-04-12 NOTE — ED Triage Notes (Addendum)
PER EMS: pt from [facility], there for rehab after DVT surgery. Pt reports increased painful, swelling and bruising to right groin that started one and a half hours ago. She had a procedure on Tue and Thur this week. EMS unsure what type of procedure but states it involved her DVTs. 127/58, HR-100.  100 mcg fentanyl given en route. Afib on monitor.

## 2017-04-12 NOTE — Anesthesia Postprocedure Evaluation (Signed)
Anesthesia Post Note  Patient: Noberto Retort  Procedure(s) Performed: Procedure(s) (LRB): EVACUATION HEMATOMA RIGHT GROIN WITH FEMORAL ARTERY REPAIR (Right)  Patient location during evaluation: PACU Anesthesia Type: General Level of consciousness: awake and alert, oriented and patient cooperative Pain management: pain level controlled Vital Signs Assessment: post-procedure vital signs reviewed and stable Respiratory status: spontaneous breathing, nonlabored ventilation, respiratory function stable and patient connected to nasal cannula oxygen Cardiovascular status: blood pressure returned to baseline and stable Postop Assessment: no signs of nausea or vomiting Anesthetic complications: no       Last Vitals:  Vitals:   04/12/17 1729 04/12/17 1730  BP: (!) 184/119   Pulse:  (!) 114  Resp:    Temp:      Last Pain:  Vitals:   04/12/17 1715  TempSrc:   PainSc: Asleep                 Faustina Gebert,E. Jendaya Gossett

## 2017-04-13 ENCOUNTER — Encounter (HOSPITAL_COMMUNITY): Payer: Self-pay | Admitting: Vascular Surgery

## 2017-04-13 DIAGNOSIS — S301XXA Contusion of abdominal wall, initial encounter: Secondary | ICD-10-CM

## 2017-04-13 LAB — BASIC METABOLIC PANEL
Anion gap: 8 (ref 5–15)
BUN: 24 mg/dL — ABNORMAL HIGH (ref 6–20)
CO2: 22 mmol/L (ref 22–32)
Calcium: 8.5 mg/dL — ABNORMAL LOW (ref 8.9–10.3)
Chloride: 105 mmol/L (ref 101–111)
Creatinine, Ser: 1.25 mg/dL — ABNORMAL HIGH (ref 0.44–1.00)
GFR, EST AFRICAN AMERICAN: 44 mL/min — AB (ref >60)
GFR, EST NON AFRICAN AMERICAN: 38 mL/min — AB (ref >60)
Glucose, Bld: 354 mg/dL — ABNORMAL HIGH (ref 65–99)
POTASSIUM: 4.5 mmol/L (ref 3.5–5.1)
SODIUM: 135 mmol/L (ref 135–145)

## 2017-04-13 LAB — TYPE AND SCREEN
ABO/RH(D): O NEG
ANTIBODY SCREEN: NEGATIVE
UNIT DIVISION: 0
UNIT DIVISION: 0
UNIT DIVISION: 0
Unit division: 0

## 2017-04-13 LAB — BPAM RBC
BLOOD PRODUCT EXPIRATION DATE: 201806212359
Blood Product Expiration Date: 201806192359
Blood Product Expiration Date: 201806212359
Blood Product Expiration Date: 201806222359
ISSUE DATE / TIME: 201805261542
ISSUE DATE / TIME: 201805261542
ISSUE DATE / TIME: 201805261721
ISSUE DATE / TIME: 201805261721
UNIT TYPE AND RH: 5100
UNIT TYPE AND RH: 5100
UNIT TYPE AND RH: 9500
Unit Type and Rh: 9500

## 2017-04-13 LAB — GLUCOSE, CAPILLARY
GLUCOSE-CAPILLARY: 292 mg/dL — AB (ref 65–99)
GLUCOSE-CAPILLARY: 218 mg/dL — AB (ref 65–99)
Glucose-Capillary: 232 mg/dL — ABNORMAL HIGH (ref 65–99)
Glucose-Capillary: 162 mg/dL — ABNORMAL HIGH (ref 65–99)

## 2017-04-13 LAB — CBC
HCT: 35.2 % — ABNORMAL LOW (ref 36.0–46.0)
Hemoglobin: 11.7 g/dL — ABNORMAL LOW (ref 12.0–15.0)
MCH: 29.9 pg (ref 26.0–34.0)
MCHC: 33.2 g/dL (ref 30.0–36.0)
MCV: 90 fL (ref 78.0–100.0)
PLATELETS: 183 10*3/uL (ref 150–400)
RBC: 3.91 MIL/uL (ref 3.87–5.11)
RDW: 16.7 % — ABNORMAL HIGH (ref 11.5–15.5)
WBC: 20.1 10*3/uL — AB (ref 4.0–10.5)

## 2017-04-13 MED ORDER — LACTATED RINGERS IV SOLN
INTRAVENOUS | Status: DC
  Administered 2017-04-13 – 2017-04-17 (×6): via INTRAVENOUS

## 2017-04-13 MED ORDER — INSULIN ASPART 100 UNIT/ML ~~LOC~~ SOLN
0.0000 [IU] | Freq: Every day | SUBCUTANEOUS | Status: DC
  Administered 2017-04-15: 3 [IU] via SUBCUTANEOUS

## 2017-04-13 MED ORDER — MORPHINE SULFATE (PF) 4 MG/ML IV SOLN: 2.0000 mg | INTRAVENOUS | Status: DC | PRN

## 2017-04-13 MED ORDER — INSULIN ASPART 100 UNIT/ML ~~LOC~~ SOLN
0.0000 [IU] | Freq: Three times a day (TID) | SUBCUTANEOUS | Status: DC
  Administered 2017-04-13: 3 [IU] via SUBCUTANEOUS
  Administered 2017-04-13: 5 [IU] via SUBCUTANEOUS
  Administered 2017-04-14: 1 [IU] via SUBCUTANEOUS
  Administered 2017-04-14 – 2017-04-15 (×3): 2 [IU] via SUBCUTANEOUS
  Administered 2017-04-15: 5 [IU] via SUBCUTANEOUS
  Administered 2017-04-15: 1 [IU] via SUBCUTANEOUS
  Administered 2017-04-16 (×2): 2 [IU] via SUBCUTANEOUS
  Administered 2017-04-17: 4 [IU] via SUBCUTANEOUS
  Administered 2017-04-17: 3 [IU] via SUBCUTANEOUS

## 2017-04-13 NOTE — Progress Notes (Signed)
Subjective: Denies chest/abdominal pain.  Breathing okay.  Lt lower leg sore.  BP 107/73   Pulse (!) 102   Temp 99.1 F (37.3 C)   Resp 18   Ht 5\' 2"  (1.575 m)   Wt 112 lb 7 oz (51 kg)   SpO2 100%   BMI 20.56 kg/m   I/O last 3 completed shifts: In: 784.2 [I.V.:734.2; IV Piggyback:50] Out: 735 [Urine:75; Drains:160; Blood:500]    General - pleasant Eyes - pupils reactive ENT - no sinus tenderness, no oral exudate, no LAN Cardiac - irregular, no murmur Chest - no wheeze, rales Abd - soft, non tender Ext - bruising Rt groin Skin - no rashes Neuro - normal strength Psych - normal mood  CMP Latest Ref Rng & Units 04/13/2017 04/12/2017 04/12/2017  Glucose 65 - 99 mg/dL 354(H) 342(H) 309(H)  BUN 6 - 20 mg/dL 24(H) - 16  Creatinine 0.44 - 1.00 mg/dL 1.25(H) - 1.05(H)  Sodium 135 - 145 mmol/L 135 142 140  Potassium 3.5 - 5.1 mmol/L 4.5 4.8 3.7  Chloride 101 - 111 mmol/L 105 - 106  CO2 22 - 32 mmol/L 22 - 22  Calcium 8.9 - 10.3 mg/dL 8.5(L) - 9.1  Total Protein 6.5 - 8.1 g/dL - - -  Total Bilirubin 0.3 - 1.2 mg/dL - - -  Alkaline Phos 38 - 126 U/L - - -  AST 15 - 41 U/L - - -  ALT 14 - 54 U/L - - -   CBC Latest Ref Rng & Units 04/13/2017 04/12/2017 04/12/2017  WBC 4.0 - 10.5 K/uL 20.1(H) - 14.1(H)  Hemoglobin 12.0 - 15.0 g/dL 11.7(L) 10.5(L) 10.6(L)  Hematocrit 36.0 - 46.0 % 35.2(L) 31.0(L) 33.1(L)  Platelets 150 - 400 K/uL 183 - 239    Ct Abdomen Pelvis Wo Contrast  Result Date: 04/12/2017 CLINICAL DATA:  Right inguinal swelling. EXAM: CT ABDOMEN AND PELVIS WITHOUT CONTRAST TECHNIQUE: Multidetector CT imaging of the abdomen and pelvis was performed following the standard protocol without IV contrast. COMPARISON:  None. FINDINGS: Lower chest: Lung bases are clear. Aortic atherosclerosis noted. Calcifications in the RCA and LAD coronary artery noted. Hepatobiliary: No focal liver abnormality. Multiple stones identified within the gallbladder. These measure up to 1.2 cm. No  gallbladder wall thickening or biliary dilatation. Pancreas: Unremarkable. No pancreatic ductal dilatation or surrounding inflammatory changes. Spleen: Geographic area of low attenuation involving the inferior spleen is identified, new from 03/04/2017. Adrenals/Urinary Tract: The adrenal glands appear normal. Unremarkable appearance of the kidneys. The urinary bladder is within normal limits. Stomach/Bowel: Stomach is within normal limits. Appendix appears normal. No evidence of bowel wall thickening, distention, or inflammatory changes. Vascular/Lymphatic: Aortic atherosclerosis. No aneurysm. No upper abdominal or pelvic adenopathy. Reproductive: The uterus is unremarkable. Left adnexal cyst is again noted measuring 5.3 cm and 7 HU. Other: There is a large well-circumscribed mass within the left inguinal region which extends laterally into the soft tissues overlying the lateral pelvis. The ventral component measures 3.9 x 8.4 by 9.2 cm. The more hyperdense lateral component Measures 8.7 x 4.1 by 7.5 cm. There is diffuse stranding within the surrounding soft tissues. Musculoskeletal: Previous left hip arthroplasty. Suggestion of right hip avascular necrosis, image 81 of series 8. There is degenerative disc disease noted throughout the lumbar spine. Compression fractures are noted at T11 and L5. IMPRESSION: 1. Examination is positive for large complex mass involving the ventral aspect of the right inguinal region and extending laterally to overly the right iliac bone. This is  favored to represent a large acute and subacute hematoma. 2. 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 3.  Aortic Atherosclerosis (ICD10-I70.0). 4. Thoracic and lumbar compression deformities. 5. Gallstones These results were called by telephone at the time of interpretation on 04/12/2017 at 4:02 pm to Dr. Damita Dunnings , who verbally acknowledged these results. Electronically Signed   By: Kerby Moors M.D.   On: 04/12/2017 16:03     Assessment/plan:  Hematoma after catheter directed thrombolysis of Lt leg 5/22 and 5/23. - post op care per VVS and IR - f/u CBC  Oliguria. - continue IV fluids  Hx of A fib. HTN. - hold anticoagulation until okay with VVS  Updated family at bedside  Transfer to tele 5/27 >> To [facility] 5/28 and PCCM off.  Chesley Mires, MD [facility] Pulmonary/Critical Care 04/13/2017, 10:14 AM Pager:  [phone] After 3pm call: [phone]

## 2017-04-13 NOTE — Progress Notes (Signed)
Pt's morning lab glucose level in 300's. Informed Dr. Corrie Dandy, order to start [id number].

## 2017-04-13 NOTE — Progress Notes (Signed)
Patient noted to be in afib on the monitor with a heart rate between 110 and 135. Currently sitting up in bed eating dinner. Patient denies shortness of breath, chest pain, palpitations, or chest tightness. Requested that patient call nurse if she experiences any of these symptoms. Will continue to monitor.

## 2017-04-13 NOTE — Progress Notes (Signed)
eLink Physician-Brief Progress Note Patient Name: [name] DOB: November 16, 1928 MRN: [id number]   Date of Service  04/13/2017  HPI/Events of Note  Patient admitted for vascular procedure, complicated by groin hematoma.   There is concern about decreasing urine output.   eICU Interventions  Will start lactated Ringer's     Intervention Category Major Interventions: Other:  [place] 04/13/2017, 5:12 AM

## 2017-04-13 NOTE — Progress Notes (Signed)
Pt output throughout nightshift 40cc's. Pt has foley cathter. Pt was bladder scanned, bladder scanned showed 56cc's in pt's  bladder.

## 2017-04-13 NOTE — Progress Notes (Signed)
Interventional Radiology Progress Note  83 yo female, admit emergently through ED yesterday for acute right hip/groin expanding hematoma, now POD 1, SP surgical evacuation, exploration, and femoral artery repair with Vascular Surgery.    Currently admit to ICU.    Previous history of left LE CLI, with revascularization of CTO of left popliteal artery and left PT artery 03/25/2017.   Discharged 03/25/2017 on re-start oral coumadin (Afib) and 325mg  POASA daily. She experienced acute thrombosis of left lower extremity on 04/03/2017, and was admitted Tuesday May 22 to [facility] team for catheter-directed TNK thrombolysis of the acute leg.  Full flow was accomplished, and she was DC'd on May 24.  AC strategy on DC Thursday May 24 was lovenox bridge to oral coumadin for INR target of 2-3 (afib), with switch to 75mg  plavix daily, and stop ASA.    Patient experienced acute right hip hematoma yesterday, Saturday May 26th at lunchtime.    Now in ICU recovering, SP surgery and volume resucitation.  She tells me feeling better, but urine output decreased.    Motor and Sensory of bilateral lower extremity intact.    Agree with current care.   PCP: Dr. Leanna Battles, [facility]  Call with questions/concerns.   [phone]  Signed,  Dulcy Fanny Earleen Newport, DO

## 2017-04-13 NOTE — Progress Notes (Signed)
Subjective: Interval History: none.. Oriented this morning. Reports some soreness in her right groin  Objective: Vital signs in last 24 hours: Temp:  [97.3 F (36.3 C)-99.3 F (37.4 C)] 99.1 F (37.3 C) (05/27 0600) Pulse Rate:  [35-124] 102 (05/27 0600) Resp:  [15-30] 18 (05/27 0600) BP: (96-184)/(41-119) 107/73 (05/27 0600) SpO2:  [78 %-100 %] 100 % (05/27 0600) Weight:  [112 lb (50.8 kg)-112 lb 7 oz (51 kg)] 112 lb 7 oz (51 kg) (05/27 0325)  Intake/Output from previous day: 05/26 0701 - 05/27 0700 In: 784.2 [I.V.:734.2; IV Piggyback:50] Out: 825 [Urine:75; Drains:160; Blood:500] Intake/Output this shift: No intake/output data recorded.  Right groin incision intact. Marked bruising but no reaccumulation of hematoma. Approximately 160 cc out drains at surgery. Easily palpable right popliteal pulse. I do not palpate left popliteal pulse. No change in toe amputation site on the left with eschar present  Lab Results:  Recent Labs  04/12/17 1452 04/12/17 1628 04/13/17 0022  WBC 14.1*  --  20.1*  HGB 10.6* 10.5* 11.7*  HCT 33.1* 31.0* 35.2*  PLT 239  --  183   BMET  Recent Labs  04/12/17 1452 04/12/17 1628 04/13/17 0022  NA 140 142 135  K 3.7 4.8 4.5  CL 106  --  105  CO2 22  --  22  GLUCOSE 309* 342* 354*  BUN 16  --  24*  CREATININE 1.05*  --  1.25*  CALCIUM 9.1  --  8.5*    Studies/Results: Ct Abdomen Pelvis Wo Contrast  Result Date: 04/12/2017 CLINICAL DATA:  Right inguinal swelling. EXAM: CT ABDOMEN AND PELVIS WITHOUT CONTRAST TECHNIQUE: Multidetector CT imaging of the abdomen and pelvis was performed following the standard protocol without IV contrast. COMPARISON:  None. FINDINGS: Lower chest: Lung bases are clear. Aortic atherosclerosis noted. Calcifications in the RCA and LAD coronary artery noted. Hepatobiliary: No focal liver abnormality. Multiple stones identified within the gallbladder. These measure up to 1.2 cm. No gallbladder wall thickening or  biliary dilatation. Pancreas: Unremarkable. No pancreatic ductal dilatation or surrounding inflammatory changes. Spleen: Geographic area of low attenuation involving the inferior spleen is identified, new from 03/04/2017. Adrenals/Urinary Tract: The adrenal glands appear normal. Unremarkable appearance of the kidneys. The urinary bladder is within normal limits. Stomach/Bowel: Stomach is within normal limits. Appendix appears normal. No evidence of bowel wall thickening, distention, or inflammatory changes. Vascular/Lymphatic: Aortic atherosclerosis. No aneurysm. No upper abdominal or pelvic adenopathy. Reproductive: The uterus is unremarkable. Left adnexal cyst is again noted measuring 5.3 cm and 7 HU. Other: There is a large well-circumscribed mass within the left inguinal region which extends laterally into the soft tissues overlying the lateral pelvis. The ventral component measures 3.9 x 8.4 by 9.2 cm. The more hyperdense lateral component Measures 8.7 x 4.1 by 7.5 cm. There is diffuse stranding within the surrounding soft tissues. Musculoskeletal: Previous left hip arthroplasty. Suggestion of right hip avascular necrosis, image 81 of series 8. There is degenerative disc disease noted throughout the lumbar spine. Compression fractures are noted at T11 and L5. IMPRESSION: 1. Examination is positive for large complex mass involving the ventral aspect of the right inguinal region and extending laterally to overly the right iliac bone. This is favored to represent a large acute and subacute hematoma. 2. 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 3.  Aortic Atherosclerosis (ICD10-I70.0). 4. Thoracic and lumbar compression deformities. 5. Gallstones These results were called by telephone at the time of interpretation on 04/12/2017 at 4:02 pm to Dr. Damita Dunnings , who  verbally acknowledged these results. Electronically Signed   By: Kerby Moors M.D.   On: 04/12/2017 16:03   Ir Angiogram Extremity Left  Result Date:  04/08/2017 INDICATION: 83 year old female with acute thrombosis of left superficial femoral artery stent system. SFA and popliteal stent system placed 03/25/2017 for treatment of critical limb ischemia and nonhealing wound. She returns today for initiation of thrombolysis and treatment EXAM: ULTRASOUND GUIDED ACCESS RIGHT COMMON FEMORAL ARTERY ANGIOGRAM LEFT LOWER EXTREMITY INITIATION OF ARTERIAL THROMBOLYSIS WITH CATHETER DIRECTED THERAPY MEDICATIONS: None ANESTHESIA/SEDATION: Moderate (conscious) sedation was employed during this procedure. A total of Versed 2.0 mg and Fentanyl 100 mcg was administered intravenously. Moderate Sedation Time: 45 minutes. The patient's level of consciousness and vital signs were monitored continuously by radiology nursing throughout the procedure under my direct supervision. CONTRAST:  40 cc Isovue FLUOROSCOPY TIME:  Fluoroscopy Time: 10 minutes 24 seconds (67 mGy). COMPLICATIONS: None PROCEDURE: Informed consent was obtained from the patient following explanation of the procedure, risks, benefits and alternatives. The patient understands, agrees and consents for the procedure. All questions were addressed. A time out was performed prior to the initiation of the procedure. Maximal barrier sterile technique utilized including caps, mask, sterile gowns, sterile gloves, large sterile drape, hand hygiene, and Betadine prep. Ultrasound survey of the right inguinal region was performed with images stored and sent to PACs. A micropuncture needle was used access the right common femoral artery under ultrasound. With excellent arterial blood flow returned, and an .018 micro wire was passed through the needle, observed enter the abdominal aorta under fluoroscopy. The needle was removed, and a micropuncture sheath was placed over the wire. The inner dilator and wire were removed, and an 035 Bentson wire was advanced under fluoroscopy into the abdominal aorta. The sheath was removed and a  standard 5 [place] vascular sheath was placed. The dilator was removed and the sheath was flushed. Omni Flush catheter was advanced over the Bentson wire into the abdominal aorta and the Bentson wire was retracted to form the curve on the catheter. Catheter and wire were then directed down the contralateral common iliac artery. The wire was advanced to the superficial femoral artery. Catheter was removed an a 5 French vertebral catheter was placed. Angiogram of the left lower extremity was then performed. Rosen wire was then passed through the catheter into the superficial femoral artery in the catheter was removed. Sheath was removed. A 6 French 45 cm Arrow braided sheath was then placed over the wire into the distal external iliac artery. Vertebral catheter was then placed over the Rosen wire in the [place] wire was removed. Angiogram was performed. Combination of Rosen wire and the vertebral catheter were then used to traverse the proximal 2/3 of the thrombosed superficial femoral artery. When the catheter reached the popliteal artery there was significant resistance. Vertebral catheter was removed and a Uni fuse infusion catheter, 135 cm length, 40 cm infusion length was selected. This was placed over the [facility] wire. Rosen wire was then was removed and a stiff Glidewire was then used. The tip of the Uni fuse catheter was slowly advanced into the origin of the posterior tibial artery. Glidewire was removed, limited angiogram was performed, and then the obturator wire was placed. Patient tolerated the procedure well. Patient remained hemodynamically stable throughout. No complications were encountered.  No significant blood loss FINDINGS: Angiogram demonstrates occlusion of the stent system of the distal superficial femoral artery an the popliteal artery. There is no significant reconstitution of flow into  the distal tibial arteries, however, patency of the distal popliteal artery, tibioperoneal trunk, and the  proximal 1/3 of the anterior tibial artery was evident. The proximal posterior tibial artery was occluded. The anterior tibial artery is again occluded proximally, which was the case on the prior angiogram. There is critical stenosis at the origin of the peroneal artery. After placement of the infusion catheter, injection demonstrates partial filling of the posterior tibial artery. IMPRESSION: Status post ultrasound guided access of right common femoral artery and placement of a 40 cm length infusion catheter across acute thrombosis of the left superficial femoral artery and popliteal artery for initiation of thrombolysis. Signed, Dulcy Fanny. Earleen Newport, DO [facility] Electronically Signed   By: Corrie Mckusick D.O.   On: 04/08/2017 13:41   Ir Angiogram Extremity Left  Result Date: 03/25/2017 INDICATION: 83 year old female with a history of left lower extremity critical limb ischemia and a nonhealing amputation site. Prior ankle brachial index measures in the mild range of arterial occlusive disease, with the segmental exam demonstrating evidence of a distal femoral or popliteal occlusion. CT performed 03/04/2017 confirms popliteal occlusion with questionable patency of the tibial vessels. She presents today for an angiogram and attempted treatment of the popliteal occlusion. EXAM: ULTRASOUND GUIDED ACCESS OF RIGHT COMMON FEMORAL ARTERY ANGIOGRAM LEFT LOWER EXTREMITY REVASCULARIZATION OF OCCLUDED POPLITEAL ARTERY WITH ANGIOPLASTY AND STENTING. REVASCULARIZATION OF OCCLUDED TIBIOPERONEAL ARTERY AND POSTERIOR TIBIAL ARTERY WITH BALLOON ANGIOPLASTY AND RESTORATION OF IN-LINE FLOW TO THE ANKLE MEDICATIONS: 200 MCG NITROGLYCERIN INTRA-ARTERIAL 8000 UNITS HEPARIN FOR ANTI COAGULATION 5 MG HYDRALAZINE ANESTHESIA/SEDATION: Moderate (conscious) sedation was employed during this procedure. A total of Versed 2.0 mg and Fentanyl 100 mcg was administered intravenously.  Moderate Sedation Time: 180 minutes. The patient's level of consciousness and vital signs were monitored continuously by radiology nursing throughout the procedure under my direct supervision. CONTRAST:  200 cc Isovue-300 FLUOROSCOPY TIME:  Fluoroscopy Time: 38 minutes 12 seconds (104.9 mGy). COMPLICATIONS: None PROCEDURE: Informed consent was obtained from the patient following explanation of the procedure, risks, benefits and alternatives. The patient understands, agrees and consents for the procedure. All questions were addressed. A time out was performed prior to the initiation of the procedure. Maximal barrier sterile technique utilized including caps, mask, sterile gowns, sterile gloves, large sterile drape, hand hygiene, and Betadine prep. Ultrasound survey of the right inguinal region was performed with images stored and sent to PACs. A micropuncture needle was used access the right common femoral artery under ultrasound. With excellent arterial blood flow returned, and an .018 micro wire was passed through the needle, observed enter the abdominal aorta under fluoroscopy. The needle was removed, and a micropuncture sheath was placed over the wire. The inner dilator and wire were removed, and an 035 Bentson wire was advanced under fluoroscopy into the abdominal aorta. The sheath was removed and a standard 5 [place] vascular sheath was placed. The dilator was removed and the sheath was flushed. Omni Flush catheter was advanced over the Bentson wire to the aortic bifurcation. Pelvic angiogram performed. Omni Flush catheter was then used to navigate the Bentson wire into the left external iliac artery. Omni Flush catheter was removed, and 100 cm vertebral catheter was advanced into the external iliac artery. Angiogram of the left lower extremity was performed. Rosen wire was then placed through the vertebral catheter and the 5 French short sheath was exchanged for a 6 [place] braided 65cm Arrow sheath. Sheath was  advanced into the proximal superficial femoral  artery. Multiple obliquities at the left knee were required in order to visualize the occlusion of the popliteal artery given the left knee arthroplasty hardware. Once adequate angiogram was performed, the proximal occlusion was engaged with the vertebral catheter and an 035 Coons wire. The occlusion was traversed with the vertebral catheter, with the vertebral catheter in the below knee popliteal artery. Wire was removed and a luminal position was confirmed with injection. 014 BMW wire was then advanced through the vertebral catheter and the vertebral catheter was removed. Balloon angioplasty was then performed to a diameter of 4 mm along the length of the abnormal distal superficial femoral artery and the popliteal occlusion into the below knee popliteal artery. A Supera 4 mm x 80 mm was deployed in the popliteal artery across the occluded site, with the stent structure extended proximally with a 5 mm x 80 mm stent. Repeat angiogram was performed. Balloon angioplasty was then performed along the length of the stent site with 5 mm by 60 mm. Combination of angled CXi catheter and 016 Fathom wire were then used to engage the distal tibioperoneal trunk occlusion and the posterior tibial artery occlusion. This catheter and wire combination were successful with traversing the inclusion. Once the wire was removed and a luminal position was confirmed, the BMW wire was advanced through the CXI catheter into the posterior tibial artery. Balloon angioplasty was performed with 3 mm diameter balloon. Angiogram was performed, confirming patency to the ankle. All catheters and wires were removed. Final angiogram was performed. The sheath was then withdrawn to the right external iliac artery and angiogram was performed. The braided sheath was then exchanged for a short 6 [place] sheath. Given that the ACT greater than 150 at the time of conclusion, sheath remain in place on a  pressurized saline bag while heparin was metabolites. The patient tolerated the procedure well and remained hemodynamically stable throughout. No complications were encountered and no significant blood loss. FINDINGS: Ultrasound survey of the right common femoral artery demonstrates mild calcifications with patent vessel. Angiogram of the pelvic vasculature demonstrates dense calcium with mild atherosclerotic changes bilaterally. Bilateral L4 and L5 lumbar arteries are patent. Bilateral hypogastric arteries are patent with patent pelvic vasculature. No significant stenosis of the right or left external iliac arteries. Angiogram of the left lower extremity demonstrates mild disease of the proximal left superficial femoral artery with patent profunda femoris. 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 The below knee popliteal artery is patent from collateral flow. Anterior tibial artery origin is patent, occluded in the proximal third beyond the origin. Distal tibioperoneal trunk is occluded with occlusion extending into the proximal posterior tibial artery and peroneal artery. Collateral flow reconstitutes the distal peroneal artery, posterior tibial artery, and anterior tibial artery which remain patent at the ankle. There is moderate disease of the distal posterior tibial artery involving the common plantar, with the lateral plantar artery remain patent. Status post treatment of popliteal occlusion and placement of overlapping stent system proximally into the disease superficial femoral artery, there is excellent flow through the SFA and popliteal artery with no dissection. Continued flow through the proximal anterior tibial artery. Status post treatment of tibioperoneal occlusion and revascularization of the posterior tibial artery there is in-line flow restored through the posterior tibial  artery to the ankle. At the conclusion of the case there is palpable posterior tibial pulse on the  left. IMPRESSION: Status post left lower extremity angiogram and revascularization of chronic total occlusion of popliteal artery, distal TP trunk, and origin of the posterior tibial artery with restoration of in-line flow to the ankle via the posterior tibial artery after stenting of the popliteal occlusion. Signed, Dulcy Fanny. Earleen Newport DO [facility] PLAN: The right common femoral artery sheath will stay until with the ACT is below 200. Plan for closure with Exoseal. Gentle hydration IV. Right leg straight until the sheath is out. After the sheath is out the right hip will stay straight for 4 hours. Plan on DC home today. Should the patient have plateau of wound healing, the next target would be left anterior tibial artery in the angiosome, with previous discussion of tibial access and anterior tibial artery revascularization. Electronically Signed   By: Corrie Mckusick D.O.   On: 03/25/2017 17:16   Ir Angiogram Selective Each Additional Vessel  Result Date: 03/25/2017 INDICATION: 83 year old female with a history of left lower extremity critical limb ischemia and a nonhealing amputation site. Prior ankle brachial index measures in the mild range of arterial occlusive disease, with the segmental exam demonstrating evidence of a distal femoral or popliteal occlusion. CT performed 03/04/2017 confirms popliteal occlusion with questionable patency of the tibial vessels. She presents today for an angiogram and attempted treatment of the popliteal occlusion. EXAM: ULTRASOUND GUIDED ACCESS OF RIGHT COMMON FEMORAL ARTERY ANGIOGRAM LEFT LOWER EXTREMITY REVASCULARIZATION OF OCCLUDED POPLITEAL ARTERY WITH ANGIOPLASTY AND STENTING. REVASCULARIZATION OF OCCLUDED TIBIOPERONEAL ARTERY AND POSTERIOR TIBIAL ARTERY WITH BALLOON ANGIOPLASTY AND RESTORATION OF IN-LINE FLOW TO THE  ANKLE MEDICATIONS: 200 MCG NITROGLYCERIN INTRA-ARTERIAL 8000 UNITS HEPARIN FOR ANTI COAGULATION 5 MG HYDRALAZINE ANESTHESIA/SEDATION: Moderate (conscious) sedation was employed during this procedure. A total of Versed 2.0 mg and Fentanyl 100 mcg was administered intravenously. Moderate Sedation Time: 180 minutes. The patient's level of consciousness and vital signs were monitored continuously by radiology nursing throughout the procedure under my direct supervision. CONTRAST:  200 cc Isovue-300 FLUOROSCOPY TIME:  Fluoroscopy Time: 38 minutes 12 seconds (104.9 mGy). COMPLICATIONS: None PROCEDURE: Informed consent was obtained from the patient following explanation of the procedure, risks, benefits and alternatives. The patient understands, agrees and consents for the procedure. All questions were addressed. A time out was performed prior to the initiation of the procedure. Maximal barrier sterile technique utilized including caps, mask, sterile gowns, sterile gloves, large sterile drape, hand hygiene, and Betadine prep. Ultrasound survey of the right inguinal region was performed with images stored and sent to PACs. A micropuncture needle was used access the right common femoral artery under ultrasound. With excellent arterial blood flow returned, and an .018 micro wire was passed through the needle, observed enter the abdominal aorta under fluoroscopy. The needle was removed, and a micropuncture sheath was placed over the wire. The inner dilator and wire were removed, and an 035 Bentson wire was advanced under fluoroscopy into the abdominal aorta. The sheath was removed and a standard 5 [place] vascular sheath was placed. The dilator was removed and the sheath was flushed. Omni Flush catheter was advanced over the Bentson wire to the aortic bifurcation. Pelvic angiogram performed. Omni Flush catheter was then used to navigate the Bentson wire into the left external iliac artery. Omni Flush catheter was removed, and  100 cm vertebral catheter was advanced into the external iliac artery. Angiogram of the left lower extremity was performed. Rosen wire was then placed through the vertebral catheter and the 5 [place]  short sheath was exchanged for a 6 [place] braided 65cm Arrow sheath. Sheath was advanced into the proximal superficial femoral artery. Multiple obliquities at the left knee were required in order to visualize the occlusion of the popliteal artery given the left knee arthroplasty hardware. Once adequate angiogram was performed, the proximal occlusion was engaged with the vertebral catheter and an 035 Coons wire. The occlusion was traversed with the vertebral catheter, with the vertebral catheter in the below knee popliteal artery. Wire was removed and a luminal position was confirmed with injection. 014 BMW wire was then advanced through the vertebral catheter and the vertebral catheter was removed. Balloon angioplasty was then performed to a diameter of 4 mm along the length of the abnormal distal superficial femoral artery and the popliteal occlusion into the below knee popliteal artery. A Supera 4 mm x 80 mm was deployed in the popliteal artery across the occluded site, with the stent structure extended proximally with a 5 mm x 80 mm stent. Repeat angiogram was performed. Balloon angioplasty was then performed along the length of the stent site with 5 mm by 60 mm. Combination of angled CXi catheter and 016 Fathom wire were then used to engage the distal tibioperoneal trunk occlusion and the posterior tibial artery occlusion. This catheter and wire combination were successful with traversing the inclusion. Once the wire was removed and a luminal position was confirmed, the BMW wire was advanced through the CXI catheter into the posterior tibial artery. Balloon angioplasty was performed with 3 mm diameter balloon. Angiogram was performed, confirming patency to the ankle. All catheters and wires were removed. Final  angiogram was performed. The sheath was then withdrawn to the right external iliac artery and angiogram was performed. The braided sheath was then exchanged for a short 6 [place] sheath. Given that the ACT greater than 150 at the time of conclusion, sheath remain in place on a pressurized saline bag while heparin was metabolites. The patient tolerated the procedure well and remained hemodynamically stable throughout. No complications were encountered and no significant blood loss. FINDINGS: Ultrasound survey of the right common femoral artery demonstrates mild calcifications with patent vessel. Angiogram of the pelvic vasculature demonstrates dense calcium with mild atherosclerotic changes bilaterally. Bilateral L4 and L5 lumbar arteries are patent. Bilateral hypogastric arteries are patent with patent pelvic vasculature. No significant stenosis of the right or left external iliac arteries. Angiogram of the left lower extremity demonstrates mild disease of the proximal left superficial femoral artery with patent profunda femoris. 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 The below knee popliteal artery is patent from collateral flow. Anterior tibial artery origin is patent, occluded in the proximal third beyond the origin. Distal tibioperoneal trunk is occluded with occlusion extending into the proximal posterior tibial artery and peroneal artery. Collateral flow reconstitutes the distal peroneal artery, posterior tibial artery, and anterior tibial artery which remain patent at the ankle. There is moderate disease of the distal posterior tibial artery involving the common plantar, with the lateral plantar artery remain patent. Status post treatment of popliteal occlusion and placement of overlapping stent system proximally into the disease superficial femoral artery, there is excellent flow  through the SFA and popliteal artery with no dissection. Continued flow through the proximal anterior tibial artery. Status post treatment of tibioperoneal occlusion and revascularization of the posterior tibial artery there is in-line flow restored through the  posterior tibial artery to the ankle. At the conclusion of the case there is palpable posterior tibial pulse on the left. IMPRESSION: Status post left lower extremity angiogram and revascularization of chronic total occlusion of popliteal artery, distal TP trunk, and origin of the posterior tibial artery with restoration of in-line flow to the ankle via the posterior tibial artery after stenting of the popliteal occlusion. Signed, Dulcy Fanny. Earleen Newport DO [facility] Radiology PLAN: The right common femoral artery sheath will stay until with the ACT is below 200. Plan for closure with Exoseal. Gentle hydration IV. Right leg straight until the sheath is out. After the sheath is out the right hip will stay straight for 4 hours. Plan on DC home today. Should the patient have plateau of wound healing, the next target would be left anterior tibial artery in the angiosome, with previous discussion of tibial access and anterior tibial artery revascularization. Electronically Signed   By: Corrie Mckusick D.O.   On: 03/25/2017 17:16   Ir Fem Pop Art Stent Inc Pta Mod Sed  Result Date: 03/25/2017 INDICATION: 83 year old female with a history of left lower extremity critical limb ischemia and a nonhealing amputation site. Prior ankle brachial index measures in the mild range of arterial occlusive disease, with the segmental exam demonstrating evidence of a distal femoral or popliteal occlusion. CT performed 03/04/2017 confirms popliteal occlusion with questionable patency of the tibial vessels. She presents today for an angiogram and attempted treatment of the popliteal occlusion. EXAM: ULTRASOUND GUIDED ACCESS OF RIGHT  COMMON FEMORAL ARTERY ANGIOGRAM LEFT LOWER EXTREMITY REVASCULARIZATION OF OCCLUDED POPLITEAL ARTERY WITH ANGIOPLASTY AND STENTING. REVASCULARIZATION OF OCCLUDED TIBIOPERONEAL ARTERY AND POSTERIOR TIBIAL ARTERY WITH BALLOON ANGIOPLASTY AND RESTORATION OF IN-LINE FLOW TO THE ANKLE MEDICATIONS: 200 MCG NITROGLYCERIN INTRA-ARTERIAL 8000 UNITS HEPARIN FOR ANTI COAGULATION 5 MG HYDRALAZINE ANESTHESIA/SEDATION: Moderate (conscious) sedation was employed during this procedure. A total of Versed 2.0 mg and Fentanyl 100 mcg was administered intravenously. Moderate Sedation Time: 180 minutes. The patient's level of consciousness and vital signs were monitored continuously by radiology nursing throughout the procedure under my direct supervision. CONTRAST:  200 cc Isovue-300 FLUOROSCOPY TIME:  Fluoroscopy Time: 38 minutes 12 seconds (104.9 mGy). COMPLICATIONS: None PROCEDURE: Informed consent was obtained from the patient following explanation of the procedure, risks, benefits and alternatives. The patient understands, agrees and consents for the procedure. All questions were addressed. A time out was performed prior to the initiation of the procedure. Maximal barrier sterile technique utilized including caps, mask, sterile gowns, sterile gloves, large sterile drape, hand hygiene, and Betadine prep. Ultrasound survey of the right inguinal region was performed with images stored and sent to PACs. A micropuncture needle was used access the right common femoral artery under ultrasound. With excellent arterial blood flow returned, and an .018 micro wire was passed through the needle, observed enter the abdominal aorta under fluoroscopy. The needle was removed, and a micropuncture sheath was placed over the wire. The inner dilator and wire were removed, and an 035 Bentson wire was advanced under fluoroscopy into the abdominal aorta. The sheath was removed and a standard 5 [place] vascular sheath was placed. The dilator was removed  and the sheath was flushed. Omni Flush catheter was advanced over the Bentson wire to the aortic bifurcation. Pelvic angiogram performed. Omni Flush catheter was then used to navigate the Bentson wire into the left external iliac artery. Omni Flush catheter was removed, and 100 cm vertebral catheter was advanced into the external  iliac artery. Angiogram of the left lower extremity was performed. Rosen wire was then placed through the vertebral catheter and the 5 French short sheath was exchanged for a 6 [place] braided 65cm Arrow sheath. Sheath was advanced into the proximal superficial femoral artery. Multiple obliquities at the left knee were required in order to visualize the occlusion of the popliteal artery given the left knee arthroplasty hardware. Once adequate angiogram was performed, the proximal occlusion was engaged with the vertebral catheter and an 035 Coons wire. The occlusion was traversed with the vertebral catheter, with the vertebral catheter in the below knee popliteal artery. Wire was removed and a luminal position was confirmed with injection. 014 BMW wire was then advanced through the vertebral catheter and the vertebral catheter was removed. Balloon angioplasty was then performed to a diameter of 4 mm along the length of the abnormal distal superficial femoral artery and the popliteal occlusion into the below knee popliteal artery. A Supera 4 mm x 80 mm was deployed in the popliteal artery across the occluded site, with the stent structure extended proximally with a 5 mm x 80 mm stent. Repeat angiogram was performed. Balloon angioplasty was then performed along the length of the stent site with 5 mm by 60 mm. Combination of angled CXi catheter and 016 Fathom wire were then used to engage the distal tibioperoneal trunk occlusion and the posterior tibial artery occlusion. This catheter and wire combination were successful with traversing the inclusion. Once the wire was removed and a luminal  position was confirmed, the BMW wire was advanced through the CXI catheter into the posterior tibial artery. Balloon angioplasty was performed with 3 mm diameter balloon. Angiogram was performed, confirming patency to the ankle. All catheters and wires were removed. Final angiogram was performed. The sheath was then withdrawn to the right external iliac artery and angiogram was performed. The braided sheath was then exchanged for a short 6 [place] sheath. Given that the ACT greater than 150 at the time of conclusion, sheath remain in place on a pressurized saline bag while heparin was metabolites. The patient tolerated the procedure well and remained hemodynamically stable throughout. No complications were encountered and no significant blood loss. FINDINGS: Ultrasound survey of the right common femoral artery demonstrates mild calcifications with patent vessel. Angiogram of the pelvic vasculature demonstrates dense calcium with mild atherosclerotic changes bilaterally. Bilateral L4 and L5 lumbar arteries are patent. Bilateral hypogastric arteries are patent with patent pelvic vasculature. No significant stenosis of the right or left external iliac arteries. Angiogram of the left lower extremity demonstrates mild disease of the proximal left superficial femoral artery with patent profunda femoris. 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 The below knee popliteal artery is patent from collateral flow. Anterior tibial artery origin is patent, occluded in the proximal third beyond the origin. Distal tibioperoneal trunk is occluded with occlusion extending into the proximal posterior tibial artery and peroneal artery. Collateral flow reconstitutes the distal peroneal artery, posterior tibial artery, and anterior tibial artery which remain patent at the ankle. There is moderate disease of the distal  posterior tibial artery involving the common plantar, with the lateral plantar artery remain patent. Status post treatment of popliteal occlusion and placement of overlapping stent system proximally into the disease superficial femoral artery, there is excellent flow through the SFA and popliteal artery with no dissection. Continued flow through the proximal  anterior tibial artery. Status post treatment of tibioperoneal occlusion and revascularization of the posterior tibial artery there is in-line flow restored through the posterior tibial artery to the ankle. At the conclusion of the case there is palpable posterior tibial pulse on the left. IMPRESSION: Status post left lower extremity angiogram and revascularization of chronic total occlusion of popliteal artery, distal TP trunk, and origin of the posterior tibial artery with restoration of in-line flow to the ankle via the posterior tibial artery after stenting of the popliteal occlusion. Signed, Dulcy Fanny. Earleen Newport DO [facility] Radiology PLAN: The right common femoral artery sheath will stay until with the ACT is below 200. Plan for closure with Exoseal. Gentle hydration IV. Right leg straight until the sheath is out. After the sheath is out the right hip will stay straight for 4 hours. Plan on DC home today. Should the patient have plateau of wound healing, the next target would be left anterior tibial artery in the angiosome, with previous discussion of tibial access and anterior tibial artery revascularization. Electronically Signed   By: Corrie Mckusick D.O.   On: 03/25/2017 17:16   Ir Tib-pero Art Pta Mod Sed  Result Date: 03/25/2017 INDICATION: 83 year old female with a history of left lower extremity critical limb ischemia and a nonhealing amputation site. Prior ankle brachial index measures in the mild range of arterial occlusive disease, with the segmental exam demonstrating evidence of a distal femoral  or popliteal occlusion. CT performed 03/04/2017 confirms popliteal occlusion with questionable patency of the tibial vessels. She presents today for an angiogram and attempted treatment of the popliteal occlusion. EXAM: ULTRASOUND GUIDED ACCESS OF RIGHT COMMON FEMORAL ARTERY ANGIOGRAM LEFT LOWER EXTREMITY REVASCULARIZATION OF OCCLUDED POPLITEAL ARTERY WITH ANGIOPLASTY AND STENTING. REVASCULARIZATION OF OCCLUDED TIBIOPERONEAL ARTERY AND POSTERIOR TIBIAL ARTERY WITH BALLOON ANGIOPLASTY AND RESTORATION OF IN-LINE FLOW TO THE ANKLE MEDICATIONS: 200 MCG NITROGLYCERIN INTRA-ARTERIAL 8000 UNITS HEPARIN FOR ANTI COAGULATION 5 MG HYDRALAZINE ANESTHESIA/SEDATION: Moderate (conscious) sedation was employed during this procedure. A total of Versed 2.0 mg and Fentanyl 100 mcg was administered intravenously. Moderate Sedation Time: 180 minutes. The patient's level of consciousness and vital signs were monitored continuously by radiology nursing throughout the procedure under my direct supervision. CONTRAST:  200 cc Isovue-300 FLUOROSCOPY TIME:  Fluoroscopy Time: 38 minutes 12 seconds (104.9 mGy). COMPLICATIONS: None PROCEDURE: Informed consent was obtained from the patient following explanation of the procedure, risks, benefits and alternatives. The patient understands, agrees and consents for the procedure. All questions were addressed. A time out was performed prior to the initiation of the procedure. Maximal barrier sterile technique utilized including caps, mask, sterile gowns, sterile gloves, large sterile drape, hand hygiene, and Betadine prep. Ultrasound survey of the right inguinal region was performed with images stored and sent to PACs. A micropuncture needle was used access the right common femoral artery under ultrasound. With excellent arterial blood flow returned, and an .018 micro wire was passed through the needle, observed enter the abdominal aorta under fluoroscopy. The needle was removed, and a micropuncture  sheath was placed over the wire. The inner dilator and wire were removed, and an 035 Bentson wire was advanced under fluoroscopy into the abdominal aorta. The sheath was removed and a standard 5 [place] vascular sheath was placed. The dilator was removed and the sheath was flushed. Omni Flush catheter was advanced over the Bentson wire to the aortic bifurcation. Pelvic angiogram performed. Omni Flush catheter was then used to navigate the Bentson wire into  the left external iliac artery. Omni Flush catheter was removed, and 100 cm vertebral catheter was advanced into the external iliac artery. Angiogram of the left lower extremity was performed. Rosen wire was then placed through the vertebral catheter and the 5 French short sheath was exchanged for a 6 [place] braided 65cm Arrow sheath. Sheath was advanced into the proximal superficial femoral artery. Multiple obliquities at the left knee were required in order to visualize the occlusion of the popliteal artery given the left knee arthroplasty hardware. Once adequate angiogram was performed, the proximal occlusion was engaged with the vertebral catheter and an 035 Coons wire. The occlusion was traversed with the vertebral catheter, with the vertebral catheter in the below knee popliteal artery. Wire was removed and a luminal position was confirmed with injection. 014 BMW wire was then advanced through the vertebral catheter and the vertebral catheter was removed. Balloon angioplasty was then performed to a diameter of 4 mm along the length of the abnormal distal superficial femoral artery and the popliteal occlusion into the below knee popliteal artery. A Supera 4 mm x 80 mm was deployed in the popliteal artery across the occluded site, with the stent structure extended proximally with a 5 mm x 80 mm stent. Repeat angiogram was performed. Balloon angioplasty was then performed along the length of the stent site with 5 mm by 60 mm. Combination of angled CXi catheter  and 016 Fathom wire were then used to engage the distal tibioperoneal trunk occlusion and the posterior tibial artery occlusion. This catheter and wire combination were successful with traversing the inclusion. Once the wire was removed and a luminal position was confirmed, the BMW wire was advanced through the CXI catheter into the posterior tibial artery. Balloon angioplasty was performed with 3 mm diameter balloon. Angiogram was performed, confirming patency to the ankle. All catheters and wires were removed. Final angiogram was performed. The sheath was then withdrawn to the right external iliac artery and angiogram was performed. The braided sheath was then exchanged for a short 6 [place] sheath. Given that the ACT greater than 150 at the time of conclusion, sheath remain in place on a pressurized saline bag while heparin was metabolites. The patient tolerated the procedure well and remained hemodynamically stable throughout. No complications were encountered and no significant blood loss. FINDINGS: Ultrasound survey of the right common femoral artery demonstrates mild calcifications with patent vessel. Angiogram of the pelvic vasculature demonstrates dense calcium with mild atherosclerotic changes bilaterally. Bilateral L4 and L5 lumbar arteries are patent. Bilateral hypogastric arteries are patent with patent pelvic vasculature. No significant stenosis of the right or left external iliac arteries. Angiogram of the left lower extremity demonstrates mild disease of the proximal left superficial femoral artery with patent profunda femoris. 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 The below knee popliteal artery is patent from collateral flow. Anterior tibial artery origin is patent, occluded in the proximal third beyond the origin. Distal tibioperoneal trunk is occluded with occlusion  extending into the proximal posterior tibial artery and peroneal artery. Collateral flow reconstitutes the distal peroneal artery, posterior tibial artery, and anterior tibial artery which remain patent at the ankle. There is moderate disease of the distal posterior tibial artery involving the common plantar, with the lateral plantar artery remain patent. Status post treatment of popliteal occlusion and placement of overlapping stent system proximally into the disease superficial  femoral artery, there is excellent flow through the SFA and popliteal artery with no dissection. Continued flow through the proximal anterior tibial artery. Status post treatment of tibioperoneal occlusion and revascularization of the posterior tibial artery there is in-line flow restored through the posterior tibial artery to the ankle. At the conclusion of the case there is palpable posterior tibial pulse on the left. IMPRESSION: Status post left lower extremity angiogram and revascularization of chronic total occlusion of popliteal artery, distal TP trunk, and origin of the posterior tibial artery with restoration of in-line flow to the ankle via the posterior tibial artery after stenting of the popliteal occlusion. Signed, Dulcy Fanny. Earleen Newport DO [facility] Radiology PLAN: The right common femoral artery sheath will stay until with the ACT is below 200. Plan for closure with Exoseal. Gentle hydration IV. Right leg straight until the sheath is out. After the sheath is out the right hip will stay straight for 4 hours. Plan on DC home today. Should the patient have plateau of wound healing, the next target would be left anterior tibial artery in the angiosome, with previous discussion of tibial access and anterior tibial artery revascularization. Electronically Signed   By: Corrie Mckusick D.O.   On: 03/25/2017 17:16   Ir US Guide Vasc Access Right  Result Date: 04/08/2017 INDICATION:  83 year old female with acute thrombosis of left superficial femoral artery stent system. SFA and popliteal stent system placed 03/25/2017 for treatment of critical limb ischemia and nonhealing wound. She returns today for initiation of thrombolysis and treatment EXAM: ULTRASOUND GUIDED ACCESS RIGHT COMMON FEMORAL ARTERY ANGIOGRAM LEFT LOWER EXTREMITY INITIATION OF ARTERIAL THROMBOLYSIS WITH CATHETER DIRECTED THERAPY MEDICATIONS: None ANESTHESIA/SEDATION: Moderate (conscious) sedation was employed during this procedure. A total of Versed 2.0 mg and Fentanyl 100 mcg was administered intravenously. Moderate Sedation Time: 45 minutes. The patient's level of consciousness and vital signs were monitored continuously by radiology nursing throughout the procedure under my direct supervision. CONTRAST:  40 cc Isovue FLUOROSCOPY TIME:  Fluoroscopy Time: 10 minutes 24 seconds (67 mGy). COMPLICATIONS: None PROCEDURE: Informed consent was obtained from the patient following explanation of the procedure, risks, benefits and alternatives. The patient understands, agrees and consents for the procedure. All questions were addressed. A time out was performed prior to the initiation of the procedure. Maximal barrier sterile technique utilized including caps, mask, sterile gowns, sterile gloves, large sterile drape, hand hygiene, and Betadine prep. Ultrasound survey of the right inguinal region was performed with images stored and sent to PACs. A micropuncture needle was used access the right common femoral artery under ultrasound. With excellent arterial blood flow returned, and an .018 micro wire was passed through the needle, observed enter the abdominal aorta under fluoroscopy. The needle was removed, and a micropuncture sheath was placed over the wire. The inner dilator and wire were removed, and an 035 Bentson wire was advanced under fluoroscopy into the abdominal aorta. The sheath was removed and a standard 5 [place] vascular  sheath was placed. The dilator was removed and the sheath was flushed. Omni Flush catheter was advanced over the Bentson wire into the abdominal aorta and the Bentson wire was retracted to form the curve on the catheter. Catheter and wire were then directed down the contralateral common iliac artery. The wire was advanced to the superficial femoral artery. Catheter was removed an a 5 French vertebral catheter was placed. Angiogram of the left lower extremity was then performed. Rosen wire was then passed through the  catheter into the superficial femoral artery in the catheter was removed. Sheath was removed. A 6 French 45 cm Arrow braided sheath was then placed over the wire into the distal external iliac artery. Vertebral catheter was then placed over the Rosen wire in the [place] wire was removed. Angiogram was performed. Combination of Rosen wire and the vertebral catheter were then used to traverse the proximal 2/3 of the thrombosed superficial femoral artery. When the catheter reached the popliteal artery there was significant resistance. Vertebral catheter was removed and a Uni fuse infusion catheter, 135 cm length, 40 cm infusion length was selected. This was placed over the [facility] wire. Rosen wire was then was removed and a stiff Glidewire was then used. The tip of the Uni fuse catheter was slowly advanced into the origin of the posterior tibial artery. Glidewire was removed, limited angiogram was performed, and then the obturator wire was placed. Patient tolerated the procedure well. Patient remained hemodynamically stable throughout. No complications were encountered.  No significant blood loss FINDINGS: Angiogram demonstrates occlusion of the stent system of the distal superficial femoral artery an the popliteal artery. There is no significant reconstitution of flow into the distal tibial arteries, however, patency of the distal popliteal artery, tibioperoneal trunk, and the proximal 1/3 of the anterior  tibial artery was evident. The proximal posterior tibial artery was occluded. The anterior tibial artery is again occluded proximally, which was the case on the prior angiogram. There is critical stenosis at the origin of the peroneal artery. After placement of the infusion catheter, injection demonstrates partial filling of the posterior tibial artery. IMPRESSION: Status post ultrasound guided access of right common femoral artery and placement of a 40 cm length infusion catheter across acute thrombosis of the left superficial femoral artery and popliteal artery for initiation of thrombolysis. Signed, Dulcy Fanny. Earleen Newport, DO [facility] Radiology Electronically Signed   By: Corrie Mckusick D.O.   On: 04/08/2017 13:41   Ir US Guide Vasc Access Right  Result Date: 03/25/2017 INDICATION: 83 year old female with a history of left lower extremity critical limb ischemia and a nonhealing amputation site. Prior ankle brachial index measures in the mild range of arterial occlusive disease, with the segmental exam demonstrating evidence of a distal femoral or popliteal occlusion. CT performed 03/04/2017 confirms popliteal occlusion with questionable patency of the tibial vessels. She presents today for an angiogram and attempted treatment of the popliteal occlusion. EXAM: ULTRASOUND GUIDED ACCESS OF RIGHT COMMON FEMORAL ARTERY ANGIOGRAM LEFT LOWER EXTREMITY REVASCULARIZATION OF OCCLUDED POPLITEAL ARTERY WITH ANGIOPLASTY AND STENTING. REVASCULARIZATION OF OCCLUDED TIBIOPERONEAL ARTERY AND POSTERIOR TIBIAL ARTERY WITH BALLOON ANGIOPLASTY AND RESTORATION OF IN-LINE FLOW TO THE ANKLE MEDICATIONS: 200 MCG NITROGLYCERIN INTRA-ARTERIAL 8000 UNITS HEPARIN FOR ANTI COAGULATION 5 MG HYDRALAZINE ANESTHESIA/SEDATION: Moderate (conscious) sedation was employed during this procedure. A total of Versed 2.0 mg and Fentanyl 100 mcg was administered intravenously. Moderate Sedation Time: 180  minutes. The patient's level of consciousness and vital signs were monitored continuously by radiology nursing throughout the procedure under my direct supervision. CONTRAST:  200 cc Isovue-300 FLUOROSCOPY TIME:  Fluoroscopy Time: 38 minutes 12 seconds (104.9 mGy). COMPLICATIONS: None PROCEDURE: Informed consent was obtained from the patient following explanation of the procedure, risks, benefits and alternatives. The patient understands, agrees and consents for the procedure. All questions were addressed. A time out was performed prior to the initiation of the procedure. Maximal barrier sterile technique utilized including caps, mask, sterile gowns, sterile gloves, large sterile drape, hand  hygiene, and Betadine prep. Ultrasound survey of the right inguinal region was performed with images stored and sent to PACs. A micropuncture needle was used access the right common femoral artery under ultrasound. With excellent arterial blood flow returned, and an .018 micro wire was passed through the needle, observed enter the abdominal aorta under fluoroscopy. The needle was removed, and a micropuncture sheath was placed over the wire. The inner dilator and wire were removed, and an 035 Bentson wire was advanced under fluoroscopy into the abdominal aorta. The sheath was removed and a standard 5 [place] vascular sheath was placed. The dilator was removed and the sheath was flushed. Omni Flush catheter was advanced over the Bentson wire to the aortic bifurcation. Pelvic angiogram performed. Omni Flush catheter was then used to navigate the Bentson wire into the left external iliac artery. Omni Flush catheter was removed, and 100 cm vertebral catheter was advanced into the external iliac artery. Angiogram of the left lower extremity was performed. Rosen wire was then placed through the vertebral catheter and the 5 French short sheath was exchanged for a 6 [place] braided 65cm Arrow sheath. Sheath was advanced into the proximal  superficial femoral artery. Multiple obliquities at the left knee were required in order to visualize the occlusion of the popliteal artery given the left knee arthroplasty hardware. Once adequate angiogram was performed, the proximal occlusion was engaged with the vertebral catheter and an 035 Coons wire. The occlusion was traversed with the vertebral catheter, with the vertebral catheter in the below knee popliteal artery. Wire was removed and a luminal position was confirmed with injection. 014 BMW wire was then advanced through the vertebral catheter and the vertebral catheter was removed. Balloon angioplasty was then performed to a diameter of 4 mm along the length of the abnormal distal superficial femoral artery and the popliteal occlusion into the below knee popliteal artery. A Supera 4 mm x 80 mm was deployed in the popliteal artery across the occluded site, with the stent structure extended proximally with a 5 mm x 80 mm stent. Repeat angiogram was performed. Balloon angioplasty was then performed along the length of the stent site with 5 mm by 60 mm. Combination of angled CXi catheter and 016 Fathom wire were then used to engage the distal tibioperoneal trunk occlusion and the posterior tibial artery occlusion. This catheter and wire combination were successful with traversing the inclusion. Once the wire was removed and a luminal position was confirmed, the BMW wire was advanced through the CXI catheter into the posterior tibial artery. Balloon angioplasty was performed with 3 mm diameter balloon. Angiogram was performed, confirming patency to the ankle. All catheters and wires were removed. Final angiogram was performed. The sheath was then withdrawn to the right external iliac artery and angiogram was performed. The braided sheath was then exchanged for a short 6 [place] sheath. Given that the ACT greater than 150 at the time of conclusion, sheath remain in place on a pressurized saline bag while  heparin was metabolites. The patient tolerated the procedure well and remained hemodynamically stable throughout. No complications were encountered and no significant blood loss. FINDINGS: Ultrasound survey of the right common femoral artery demonstrates mild calcifications with patent vessel. Angiogram of the pelvic vasculature demonstrates dense calcium with mild atherosclerotic changes bilaterally. Bilateral L4 and L5 lumbar arteries are patent. Bilateral hypogastric arteries are patent with patent pelvic vasculature. No significant stenosis of the right or left external iliac arteries. Angiogram of the left lower extremity demonstrates  mild disease of the proximal left superficial femoral artery with patent profunda femoris. 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 The below knee popliteal artery is patent from collateral flow. Anterior tibial artery origin is patent, occluded in the proximal third beyond the origin. Distal tibioperoneal trunk is occluded with occlusion extending into the proximal posterior tibial artery and peroneal artery. Collateral flow reconstitutes the distal peroneal artery, posterior tibial artery, and anterior tibial artery which remain patent at the ankle. There is moderate disease of the distal posterior tibial artery involving the common plantar, with the lateral plantar artery remain patent. Status post treatment of popliteal occlusion and placement of overlapping stent system proximally into the disease superficial femoral artery, there is excellent flow through the SFA and popliteal artery with no dissection. Continued flow through the proximal anterior tibial artery. Status post treatment of tibioperoneal occlusion and revascularization of the posterior tibial artery there is in-line flow restored through the posterior tibial artery to the ankle. At the  conclusion of the case there is palpable posterior tibial pulse on the left. IMPRESSION: Status post left lower extremity angiogram and revascularization of chronic total occlusion of popliteal artery, distal TP trunk, and origin of the posterior tibial artery with restoration of in-line flow to the ankle via the posterior tibial artery after stenting of the popliteal occlusion. Signed, Dulcy Fanny. Earleen Newport DO [facility] Radiology PLAN: The right common femoral artery sheath will stay until with the ACT is below 200. Plan for closure with Exoseal. Gentle hydration IV. Right leg straight until the sheath is out. After the sheath is out the right hip will stay straight for 4 hours. Plan on DC home today. Should the patient have plateau of wound healing, the next target would be left anterior tibial artery in the angiosome, with previous discussion of tibial access and anterior tibial artery revascularization. Electronically Signed   By: Corrie Mckusick D.O.   On: 03/25/2017 17:16   Ir Infusion Thrombol Arterial Initial (ms)  Result Date: 04/08/2017 INDICATION: 83 year old female with acute thrombosis of left superficial femoral artery stent system. SFA and popliteal stent system placed 03/25/2017 for treatment of critical limb ischemia and nonhealing wound. She returns today for initiation of thrombolysis and treatment EXAM: ULTRASOUND GUIDED ACCESS RIGHT COMMON FEMORAL ARTERY ANGIOGRAM LEFT LOWER EXTREMITY INITIATION OF ARTERIAL THROMBOLYSIS WITH CATHETER DIRECTED THERAPY MEDICATIONS: None ANESTHESIA/SEDATION: Moderate (conscious) sedation was employed during this procedure. A total of Versed 2.0 mg and Fentanyl 100 mcg was administered intravenously. Moderate Sedation Time: 45 minutes. The patient's level of consciousness and vital signs were monitored continuously by radiology nursing throughout the procedure under my direct supervision. CONTRAST:  40 cc Isovue  FLUOROSCOPY TIME:  Fluoroscopy Time: 10 minutes 24 seconds (67 mGy). COMPLICATIONS: None PROCEDURE: Informed consent was obtained from the patient following explanation of the procedure, risks, benefits and alternatives. The patient understands, agrees and consents for the procedure. All questions were addressed. A time out was performed prior to the initiation of the procedure. Maximal barrier sterile technique utilized including caps, mask, sterile gowns, sterile gloves, large sterile drape, hand hygiene, and Betadine prep. Ultrasound survey of the right inguinal region was performed with images stored and sent to PACs. A micropuncture needle was used access the right common femoral artery under ultrasound. With excellent arterial blood flow returned, and an .018 micro wire was passed  through the needle, observed enter the abdominal aorta under fluoroscopy. The needle was removed, and a micropuncture sheath was placed over the wire. The inner dilator and wire were removed, and an 035 Bentson wire was advanced under fluoroscopy into the abdominal aorta. The sheath was removed and a standard 5 [place] vascular sheath was placed. The dilator was removed and the sheath was flushed. Omni Flush catheter was advanced over the Bentson wire into the abdominal aorta and the Bentson wire was retracted to form the curve on the catheter. Catheter and wire were then directed down the contralateral common iliac artery. The wire was advanced to the superficial femoral artery. Catheter was removed an a 5 French vertebral catheter was placed. Angiogram of the left lower extremity was then performed. Rosen wire was then passed through the catheter into the superficial femoral artery in the catheter was removed. Sheath was removed. A 6 French 45 cm Arrow braided sheath was then placed over the wire into the distal external iliac artery. Vertebral catheter was then placed over the Rosen wire in the [place] wire was removed. Angiogram was  performed. Combination of Rosen wire and the vertebral catheter were then used to traverse the proximal 2/3 of the thrombosed superficial femoral artery. When the catheter reached the popliteal artery there was significant resistance. Vertebral catheter was removed and a Uni fuse infusion catheter, 135 cm length, 40 cm infusion length was selected. This was placed over the [facility] wire. Rosen wire was then was removed and a stiff Glidewire was then used. The tip of the Uni fuse catheter was slowly advanced into the origin of the posterior tibial artery. Glidewire was removed, limited angiogram was performed, and then the obturator wire was placed. Patient tolerated the procedure well. Patient remained hemodynamically stable throughout. No complications were encountered.  No significant blood loss FINDINGS: Angiogram demonstrates occlusion of the stent system of the distal superficial femoral artery an the popliteal artery. There is no significant reconstitution of flow into the distal tibial arteries, however, patency of the distal popliteal artery, tibioperoneal trunk, and the proximal 1/3 of the anterior tibial artery was evident. The proximal posterior tibial artery was occluded. The anterior tibial artery is again occluded proximally, which was the case on the prior angiogram. There is critical stenosis at the origin of the peroneal artery. After placement of the infusion catheter, injection demonstrates partial filling of the posterior tibial artery. IMPRESSION: Status post ultrasound guided access of right common femoral artery and placement of a 40 cm length infusion catheter across acute thrombosis of the left superficial femoral artery and popliteal artery for initiation of thrombolysis. Signed, Dulcy Fanny. Earleen Newport, DO [facility] Radiology Electronically Signed   By: Corrie Mckusick D.O.   On: 04/08/2017 13:41   Ir Jacolyn Reedy F/u Eval Art/ven Final Day (ms)  Result  Date: 04/09/2017 INDICATION: Chronic left limb ischemia, status post revascularization of chronic total occlusion of popliteal artery through proximal posterior tibial artery 03/25/2017. Patient presented with distal occlusion, and is status post overnight catheter directed thrombolytic infusion. EXAM: LEFT LOWER EXTREMITY ARTERIOGRAM THROUGH EXISTING CATHETER MEDICATIONS: None required ANESTHESIA/SEDATION: None required CONTRAST:  60 mL Isovue-300 FLUOROSCOPY TIME:  3 minutes, 57 mGy COMPLICATIONS: None immediate. PROCEDURE: Informed consent was obtained from the patient following explanation of the procedure, risks, benefits and alternatives. The patient understands, agrees and consents for the procedure. All questions were addressed. A time out was performed prior to the initiation of the procedure. Contrast was injected  through previously placed the right femoral sheath for left lower extremity arteriography. At the conclusion of the procedure, the infusion catheter was removed. The patient tolerated the procedure well. FINDINGS: Patent left profunda femoris. Atheromatous SFA with tandem areas of mild to moderate stenosis in its mid portion. Clearance of thrombus from the popliteal arterial stent. No residual thrombus or stenosis. Origin occlusion of the anterior tibial artery. Short-segment moderate stenosis just beyond the origin of the tibioperoneal trunk. Patent posterior tibial artery to just below the distal calf above the ankle as before ; no direct outflow to the plantar arch. Short-segment origin stenosis of the peroneal artery of at least moderate severity, patent distally with a prominent anterior communicating branch crossing the ankle. Reconstitution of the distal anterior tibial artery the mid calf level, patent across the ankle as dorsalis pedis. IMPRESSION: 1. Interval complete clearance of acute left lower extremity arterial thrombus after overnight catheter directed thrombolytic infusion. 2.  Restoration of inline flow through the posterior tibial artery to the ankle. 3. Tandem stenoses in the mid SFA and tibioperoneal trunk. 4. Collateral reconstitution of peroneal and anterior tibial distal runoff as above. Electronically Signed   By: Lucrezia Europe M.D.   On: 04/09/2017 15:02   Anti-infectives: Anti-infectives    Start     Dose/Rate Route Frequency Ordered Stop   04/13/17 0200  cefUROXime (ZINACEF) 1.5 g in dextrose 5 % 50 mL IVPB     1.5 g 100 mL/hr over 30 Minutes Intravenous Every 12 hours 04/12/17 1831 04/14/17 0159      Assessment/Plan: s/p Procedure(s): EVACUATION HEMATOMA RIGHT GROIN WITH FEMORAL ARTERY REPAIR (Right) Hemodynamically stable this morning. No evidence of recurrent bleeding. Okay to begin to mobilize. Hemoglobin stable. Would hold anticoagulation for atrial fibrillation for another day or 2. Okay to continue antiplatelet.   LOS: 1 day   Curt Jews 04/13/2017, 8:22 AM

## 2017-04-14 DIAGNOSIS — T148XXA Other injury of unspecified body region, initial encounter: Secondary | ICD-10-CM

## 2017-04-14 DIAGNOSIS — R1909 Other intra-abdominal and pelvic swelling, mass and lump: Secondary | ICD-10-CM

## 2017-04-14 LAB — CBC
HEMATOCRIT: 30.1 % — AB (ref 36.0–46.0)
HEMOGLOBIN: 9.8 g/dL — AB (ref 12.0–15.0)
MCH: 30.2 pg (ref 26.0–34.0)
MCHC: 32.6 g/dL (ref 30.0–36.0)
MCV: 92.6 fL (ref 78.0–100.0)
Platelets: 180 10*3/uL (ref 150–400)
RBC: 3.25 MIL/uL — ABNORMAL LOW (ref 3.87–5.11)
RDW: 17 % — ABNORMAL HIGH (ref 11.5–15.5)
WBC: 15.5 10*3/uL — AB (ref 4.0–10.5)

## 2017-04-14 LAB — GLUCOSE, CAPILLARY
GLUCOSE-CAPILLARY: 128 mg/dL — AB (ref 65–99)
Glucose-Capillary: 189 mg/dL — ABNORMAL HIGH (ref 65–99)
Glucose-Capillary: 152 mg/dL — ABNORMAL HIGH (ref 65–99)
Glucose-Capillary: 181 mg/dL — ABNORMAL HIGH (ref 65–99)

## 2017-04-14 LAB — PROTIME-INR
INR: 1.97
Prothrombin Time: 22.7 seconds — ABNORMAL HIGH (ref 11.4–15.2)

## 2017-04-14 MED ORDER — BOOST / RESOURCE BREEZE PO LIQD
237.0000 mL | Freq: Three times a day (TID) | ORAL | Status: DC
  Administered 2017-04-14 – 2017-04-17 (×8): 1 via ORAL

## 2017-04-14 MED ORDER — METOPROLOL TARTRATE 12.5 MG HALF TABLET
12.5000 mg | ORAL_TABLET | Freq: Two times a day (BID) | ORAL | Status: DC
  Administered 2017-04-14 – 2017-04-17 (×7): 12.5 mg via ORAL
  Filled 2017-04-14 (×7): qty 1

## 2017-04-14 MED ORDER — CALCIUM CARBONATE 1250 (500 CA) MG PO TABS
1250.0000 mg | ORAL_TABLET | Freq: Every day | ORAL | Status: DC
  Administered 2017-04-15 – 2017-04-17 (×3): 1250 mg via ORAL
  Filled 2017-04-14 (×3): qty 1

## 2017-04-14 MED FILL — Medication: Qty: 1 | Status: AC

## 2017-04-14 NOTE — Progress Notes (Signed)
[facility] PROGRESS NOTE  [name] FTD:[id number] DOB: 08/17/1928 DOA: 04/12/2017   PCP: Tiffany Battles, MD     Assessment/Plan: Active Problems:   Groin swelling   Hematoma of groin   Hematoma   83 y/o woman with a history of reflux, hypertension who was admitted to the ICU after evacuation of a hematoma arising from the femoral artery.  She underwent catheter directed thrombolysis of the left LE on 5/22 and 5/23.  She then presented to the [facility] ED from her nursing home on 5/26 with a large inguinal hematoma.  She underwent evacuation of the hematoma and repair of the femoral artery.  PCCM was asked to admit her after her procedure. [facility] assumed care 5/28  Assessment and plan Evacuation of hematoma. Control bleeding from right femoral artery  Plan per vascular surgery/IR Continue JP drain Follow-up CBC  History of atrial fibrillation, heart rate uncontrolled Resume metoprolol, Coumadin on hold Patient on aspirin and Plavix already  Hypertension Blood pressure stable  Left lower extremity ischemia-resolved underwent LLE arteriogram with restoration of LLE outflow   on 5/22 and completed on 5/23 with restoration of pulses     DVT prophylaxsis SCDs  Code Status:  Full code    Family Communication: Discussed in detail with the patient, all imaging results, lab results explained to the patient   Disposition Plan:  1-2 days      Consultants:  Vascular surgery  Procedures:   Evacuation of hematoma  Antibiotics: Anti-infectives    Start     Dose/Rate Route Frequency Ordered Stop   04/13/17 0200  cefUROXime (ZINACEF) 1.5 g in dextrose 5 % 50 mL IVPB     1.5 g 100 mL/hr over 30 Minutes Intravenous Every 12 hours 04/12/17 1831 04/13/17 1356         HPI/Subjective: Awake , tachy with ambulation  Objective: Vitals:   04/13/17 1400 04/13/17 1447 04/13/17 1950 04/14/17 0500  BP: 127/80 110/60 (!) 104/59 140/88  Pulse:  (!) 108 (!) 119 79   Resp: 19 18 18 18   Temp: 99.1 F (37.3 C)  97.8 F (36.6 C) 97.5 F (36.4 C)  TempSrc:   Oral Oral  SpO2:  100% 99% 98%  Weight:      Height:        Intake/Output Summary (Last 24 hours) at 04/14/17 0826 Last data filed at 04/14/17 0509  Gross per 24 hour  Intake              425 ml  Output             1040 ml  Net             -615 ml    Exam:  Examination:  General exam: Appears calm and comfortable  Respiratory system: Clear to auscultation. Respiratory effort normal. Cardiovascular system: S1 & S2 heard, irre irre. No JVD, murmurs, rubs, gallops or clicks.  Gastrointestinal system: Abdomen is nondistended, soft and nontender. No organomegaly or masses felt. Normal bowel sounds heard. Central nervous system: Alert and oriented. No focal neurological deficits. Extremities:  Incisions:  Clean and dry with extensive ecchymosis   Bilateral feet are warm; easily popliteal pulse on the right Skin: No rashes, lesions or ulcers Psychiatry: Judgement and insight appear normal. Mood & affect appropriate.     Data Reviewed: I have personally reviewed following labs and imaging studies  Micro Results Recent Results (from the past 240 hour(s))  MRSA PCR Screening  Status: None   Collection Time: 04/08/17 10:44 AM  Result Value Ref Range Status   MRSA by PCR NEGATIVE NEGATIVE Final    Comment:        The GeneXpert MRSA Assay (FDA approved for NASAL specimens only), is one component of a comprehensive MRSA colonization surveillance program. It is not intended to diagnose MRSA infection nor to guide or monitor treatment for MRSA infections.   MRSA PCR Screening     Status: None   Collection Time: 04/12/17  6:32 PM  Result Value Ref Range Status   MRSA by PCR NEGATIVE NEGATIVE Final    Comment:        The GeneXpert MRSA Assay (FDA approved for NASAL specimens only), is one component of a comprehensive MRSA colonization surveillance program. It is not intended to  diagnose MRSA infection nor to guide or monitor treatment for MRSA infections.     Radiology Reports Ct Abdomen Pelvis Wo Contrast  Result Date: 04/12/2017 CLINICAL DATA:  Right inguinal swelling. EXAM: CT ABDOMEN AND PELVIS WITHOUT CONTRAST TECHNIQUE: Multidetector CT imaging of the abdomen and pelvis was performed following the standard protocol without IV contrast. COMPARISON:  None. FINDINGS: Lower chest: Lung bases are clear. Aortic atherosclerosis noted. Calcifications in the RCA and LAD coronary artery noted. Hepatobiliary: No focal liver abnormality. Multiple stones identified within the gallbladder. These measure up to 1.2 cm. No gallbladder wall thickening or biliary dilatation. Pancreas: Unremarkable. No pancreatic ductal dilatation or surrounding inflammatory changes. Spleen: Geographic area of low attenuation involving the inferior spleen is identified, new from 03/04/2017. Adrenals/Urinary Tract: The adrenal glands appear normal. Unremarkable appearance of the kidneys. The urinary bladder is within normal limits. Stomach/Bowel: Stomach is within normal limits. Appendix appears normal. No evidence of bowel wall thickening, distention, or inflammatory changes. Vascular/Lymphatic: Aortic atherosclerosis. No aneurysm. No upper abdominal or pelvic adenopathy. Reproductive: The uterus is unremarkable. Left adnexal cyst is again noted measuring 5.3 cm and 7 HU. Other: There is a large well-circumscribed mass within the left inguinal region which extends laterally into the soft tissues overlying the lateral pelvis. The ventral component measures 3.9 x 8.4 by 9.2 cm. The more hyperdense lateral component Measures 8.7 x 4.1 by 7.5 cm. There is diffuse stranding within the surrounding soft tissues. Musculoskeletal: Previous left hip arthroplasty. Suggestion of right hip avascular necrosis, image 81 of series 8. There is degenerative disc disease noted throughout the lumbar spine. Compression fractures  are noted at T11 and L5. IMPRESSION: 1. Examination is positive for large complex mass involving the ventral aspect of the right inguinal region and extending laterally to overly the right iliac bone. This is favored to represent a large acute and subacute hematoma. 2. 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 3.  Aortic Atherosclerosis (ICD10-I70.0). 4. Thoracic and lumbar compression deformities. 5. Gallstones These results were called by telephone at the time of interpretation on 04/12/2017 at 4:02 pm to Dr. Damita Dunnings , who verbally acknowledged these results. Electronically Signed   By: Kerby Moors M.D.   On: 04/12/2017 16:03   Ir Angiogram Extremity Left  Result Date: 04/08/2017 INDICATION: 83 year old female with acute thrombosis of left superficial femoral artery stent system. SFA and popliteal stent system placed 03/25/2017 for treatment of critical limb ischemia and nonhealing wound. She returns today for initiation of thrombolysis and treatment EXAM: ULTRASOUND GUIDED ACCESS RIGHT COMMON FEMORAL ARTERY ANGIOGRAM LEFT LOWER EXTREMITY INITIATION OF ARTERIAL THROMBOLYSIS WITH CATHETER DIRECTED THERAPY MEDICATIONS: None ANESTHESIA/SEDATION: Moderate (conscious) sedation was employed during this procedure. A  total of Versed 2.0 mg and Fentanyl 100 mcg was administered intravenously. Moderate Sedation Time: 45 minutes. The patient's level of consciousness and vital signs were monitored continuously by radiology nursing throughout the procedure under my direct supervision. CONTRAST:  40 cc Isovue FLUOROSCOPY TIME:  Fluoroscopy Time: 10 minutes 24 seconds (67 mGy). COMPLICATIONS: None PROCEDURE: Informed consent was obtained from the patient following explanation of the procedure, risks, benefits and alternatives. The patient understands, agrees and consents for the procedure. All questions were addressed. A time out was performed prior to the initiation of the procedure. Maximal barrier sterile technique utilized  including caps, mask, sterile gowns, sterile gloves, large sterile drape, hand hygiene, and Betadine prep. Ultrasound survey of the right inguinal region was performed with images stored and sent to PACs. A micropuncture needle was used access the right common femoral artery under ultrasound. With excellent arterial blood flow returned, and an .018 micro wire was passed through the needle, observed enter the abdominal aorta under fluoroscopy. The needle was removed, and a micropuncture sheath was placed over the wire. The inner dilator and wire were removed, and an 035 Bentson wire was advanced under fluoroscopy into the abdominal aorta. The sheath was removed and a standard 5 [place] vascular sheath was placed. The dilator was removed and the sheath was flushed. Omni Flush catheter was advanced over the Bentson wire into the abdominal aorta and the Bentson wire was retracted to form the curve on the catheter. Catheter and wire were then directed down the contralateral common iliac artery. The wire was advanced to the superficial femoral artery. Catheter was removed an a 5 French vertebral catheter was placed. Angiogram of the left lower extremity was then performed. Rosen wire was then passed through the catheter into the superficial femoral artery in the catheter was removed. Sheath was removed. A 6 French 45 cm Arrow braided sheath was then placed over the wire into the distal external iliac artery. Vertebral catheter was then placed over the Rosen wire in the [place] wire was removed. Angiogram was performed. Combination of Rosen wire and the vertebral catheter were then used to traverse the proximal 2/3 of the thrombosed superficial femoral artery. When the catheter reached the popliteal artery there was significant resistance. Vertebral catheter was removed and a Uni fuse infusion catheter, 135 cm length, 40 cm infusion length was selected. This was placed over the [facility] wire. Rosen wire was then was removed and  a stiff Glidewire was then used. The tip of the Uni fuse catheter was slowly advanced into the origin of the posterior tibial artery. Glidewire was removed, limited angiogram was performed, and then the obturator wire was placed. Patient tolerated the procedure well. Patient remained hemodynamically stable throughout. No complications were encountered.  No significant blood loss FINDINGS: Angiogram demonstrates occlusion of the stent system of the distal superficial femoral artery an the popliteal artery. There is no significant reconstitution of flow into the distal tibial arteries, however, patency of the distal popliteal artery, tibioperoneal trunk, and the proximal 1/3 of the anterior tibial artery was evident. The proximal posterior tibial artery was occluded. The anterior tibial artery is again occluded proximally, which was the case on the prior angiogram. There is critical stenosis at the origin of the peroneal artery. After placement of the infusion catheter, injection demonstrates partial filling of the posterior tibial artery. IMPRESSION: Status post ultrasound guided access of right common femoral artery and placement of a 40 cm length infusion catheter across acute thrombosis of the  left superficial femoral artery and popliteal artery for initiation of thrombolysis. Signed, Dulcy Fanny. Earleen Newport, DO [facility] Radiology Electronically Signed   By: Corrie Mckusick D.O.   On: 04/08/2017 13:41   Ir Angiogram Extremity Left  Result Date: 03/25/2017 INDICATION: 83 year old female with a history of left lower extremity critical limb ischemia and a nonhealing amputation site. Prior ankle brachial index measures in the mild range of arterial occlusive disease, with the segmental exam demonstrating evidence of a distal femoral or popliteal occlusion. CT performed 03/04/2017 confirms popliteal occlusion with questionable patency of the tibial vessels. She presents  today for an angiogram and attempted treatment of the popliteal occlusion. EXAM: ULTRASOUND GUIDED ACCESS OF RIGHT COMMON FEMORAL ARTERY ANGIOGRAM LEFT LOWER EXTREMITY REVASCULARIZATION OF OCCLUDED POPLITEAL ARTERY WITH ANGIOPLASTY AND STENTING. REVASCULARIZATION OF OCCLUDED TIBIOPERONEAL ARTERY AND POSTERIOR TIBIAL ARTERY WITH BALLOON ANGIOPLASTY AND RESTORATION OF IN-LINE FLOW TO THE ANKLE MEDICATIONS: 200 MCG NITROGLYCERIN INTRA-ARTERIAL 8000 UNITS HEPARIN FOR ANTI COAGULATION 5 MG HYDRALAZINE ANESTHESIA/SEDATION: Moderate (conscious) sedation was employed during this procedure. A total of Versed 2.0 mg and Fentanyl 100 mcg was administered intravenously. Moderate Sedation Time: 180 minutes. The patient's level of consciousness and vital signs were monitored continuously by radiology nursing throughout the procedure under my direct supervision. CONTRAST:  200 cc Isovue-300 FLUOROSCOPY TIME:  Fluoroscopy Time: 38 minutes 12 seconds (104.9 mGy). COMPLICATIONS: None PROCEDURE: Informed consent was obtained from the patient following explanation of the procedure, risks, benefits and alternatives. The patient understands, agrees and consents for the procedure. All questions were addressed. A time out was performed prior to the initiation of the procedure. Maximal barrier sterile technique utilized including caps, mask, sterile gowns, sterile gloves, large sterile drape, hand hygiene, and Betadine prep. Ultrasound survey of the right inguinal region was performed with images stored and sent to PACs. A micropuncture needle was used access the right common femoral artery under ultrasound. With excellent arterial blood flow returned, and an .018 micro wire was passed through the needle, observed enter the abdominal aorta under fluoroscopy. The needle was removed, and a micropuncture sheath was placed over the wire. The inner dilator and wire were removed, and an 035 Bentson wire was advanced under fluoroscopy into the  abdominal aorta. The sheath was removed and a standard 5 [place] vascular sheath was placed. The dilator was removed and the sheath was flushed. Omni Flush catheter was advanced over the Bentson wire to the aortic bifurcation. Pelvic angiogram performed. Omni Flush catheter was then used to navigate the Bentson wire into the left external iliac artery. Omni Flush catheter was removed, and 100 cm vertebral catheter was advanced into the external iliac artery. Angiogram of the left lower extremity was performed. Rosen wire was then placed through the vertebral catheter and the 5 French short sheath was exchanged for a 6 [place] braided 65cm Arrow sheath. Sheath was advanced into the proximal superficial femoral artery. Multiple obliquities at the left knee were required in order to visualize the occlusion of the popliteal artery given the left knee arthroplasty hardware. Once adequate angiogram was performed, the proximal occlusion was engaged with the vertebral catheter and an 035 Coons wire. The occlusion was traversed with the vertebral catheter, with the vertebral catheter in the below knee popliteal artery. Wire was removed and a luminal position was confirmed with injection. 014 BMW wire was then advanced through the vertebral catheter and the vertebral catheter was removed. Balloon angioplasty was then performed to a diameter of 4  mm along the length of the abnormal distal superficial femoral artery and the popliteal occlusion into the below knee popliteal artery. A Supera 4 mm x 80 mm was deployed in the popliteal artery across the occluded site, with the stent structure extended proximally with a 5 mm x 80 mm stent. Repeat angiogram was performed. Balloon angioplasty was then performed along the length of the stent site with 5 mm by 60 mm. Combination of angled CXi catheter and 016 Fathom wire were then used to engage the distal tibioperoneal trunk occlusion and the posterior tibial artery occlusion. This  catheter and wire combination were successful with traversing the inclusion. Once the wire was removed and a luminal position was confirmed, the BMW wire was advanced through the CXI catheter into the posterior tibial artery. Balloon angioplasty was performed with 3 mm diameter balloon. Angiogram was performed, confirming patency to the ankle. All catheters and wires were removed. Final angiogram was performed. The sheath was then withdrawn to the right external iliac artery and angiogram was performed. The braided sheath was then exchanged for a short 6 [place] sheath. Given that the ACT greater than 150 at the time of conclusion, sheath remain in place on a pressurized saline bag while heparin was metabolites. The patient tolerated the procedure well and remained hemodynamically stable throughout. No complications were encountered and no significant blood loss. FINDINGS: Ultrasound survey of the right common femoral artery demonstrates mild calcifications with patent vessel. Angiogram of the pelvic vasculature demonstrates dense calcium with mild atherosclerotic changes bilaterally. Bilateral L4 and L5 lumbar arteries are patent. Bilateral hypogastric arteries are patent with patent pelvic vasculature. No significant stenosis of the right or left external iliac arteries. Angiogram of the left lower extremity demonstrates mild disease of the proximal left superficial femoral artery with patent profunda femoris. 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 The below knee popliteal artery is patent from collateral flow. Anterior tibial artery origin is patent, occluded in the proximal third beyond the origin. Distal tibioperoneal trunk is occluded with occlusion extending into the proximal posterior tibial artery and peroneal artery. Collateral flow reconstitutes the distal peroneal artery,  posterior tibial artery, and anterior tibial artery which remain patent at the ankle. There is moderate disease of the distal posterior tibial artery involving the common plantar, with the lateral plantar artery remain patent. Status post treatment of popliteal occlusion and placement of overlapping stent system proximally into the disease superficial femoral artery, there is excellent flow through the SFA and popliteal artery with no dissection. Continued flow through the proximal anterior tibial artery. Status post treatment of tibioperoneal occlusion and revascularization of the posterior tibial artery there is in-line flow restored through the posterior tibial artery to the ankle. At the conclusion of the case there is palpable posterior tibial pulse on the left. IMPRESSION: Status post left lower extremity angiogram and revascularization of chronic total occlusion of popliteal artery, distal TP trunk, and origin of the posterior tibial artery with restoration of in-line flow to the ankle via the posterior tibial artery after stenting of the popliteal occlusion. Signed, Dulcy Fanny. Earleen Newport DO [facility] Radiology PLAN: The right common femoral artery sheath will stay until with the ACT is below 200. Plan for closure with Exoseal. Gentle hydration IV. Right leg straight until the sheath is out. After the sheath is out the right hip will  stay straight for 4 hours. Plan on DC home today. Should the patient have plateau of wound healing, the next target would be left anterior tibial artery in the angiosome, with previous discussion of tibial access and anterior tibial artery revascularization. Electronically Signed   By: Corrie Mckusick D.O.   On: 03/25/2017 17:16   Ir Angiogram Selective Each Additional Vessel  Result Date: 03/25/2017 INDICATION: 83 year old female with a history of left lower extremity critical limb ischemia and a nonhealing amputation site. Prior  ankle brachial index measures in the mild range of arterial occlusive disease, with the segmental exam demonstrating evidence of a distal femoral or popliteal occlusion. CT performed 03/04/2017 confirms popliteal occlusion with questionable patency of the tibial vessels. She presents today for an angiogram and attempted treatment of the popliteal occlusion. EXAM: ULTRASOUND GUIDED ACCESS OF RIGHT COMMON FEMORAL ARTERY ANGIOGRAM LEFT LOWER EXTREMITY REVASCULARIZATION OF OCCLUDED POPLITEAL ARTERY WITH ANGIOPLASTY AND STENTING. REVASCULARIZATION OF OCCLUDED TIBIOPERONEAL ARTERY AND POSTERIOR TIBIAL ARTERY WITH BALLOON ANGIOPLASTY AND RESTORATION OF IN-LINE FLOW TO THE ANKLE MEDICATIONS: 200 MCG NITROGLYCERIN INTRA-ARTERIAL 8000 UNITS HEPARIN FOR ANTI COAGULATION 5 MG HYDRALAZINE ANESTHESIA/SEDATION: Moderate (conscious) sedation was employed during this procedure. A total of Versed 2.0 mg and Fentanyl 100 mcg was administered intravenously. Moderate Sedation Time: 180 minutes. The patient's level of consciousness and vital signs were monitored continuously by radiology nursing throughout the procedure under my direct supervision. CONTRAST:  200 cc Isovue-300 FLUOROSCOPY TIME:  Fluoroscopy Time: 38 minutes 12 seconds (104.9 mGy). COMPLICATIONS: None PROCEDURE: Informed consent was obtained from the patient following explanation of the procedure, risks, benefits and alternatives. The patient understands, agrees and consents for the procedure. All questions were addressed. A time out was performed prior to the initiation of the procedure. Maximal barrier sterile technique utilized including caps, mask, sterile gowns, sterile gloves, large sterile drape, hand hygiene, and Betadine prep. Ultrasound survey of the right inguinal region was performed with images stored and sent to PACs. A micropuncture needle was used access the right common femoral artery under ultrasound. With excellent arterial blood flow returned, and an  .018 micro wire was passed through the needle, observed enter the abdominal aorta under fluoroscopy. The needle was removed, and a micropuncture sheath was placed over the wire. The inner dilator and wire were removed, and an 035 Bentson wire was advanced under fluoroscopy into the abdominal aorta. The sheath was removed and a standard 5 [place] vascular sheath was placed. The dilator was removed and the sheath was flushed. Omni Flush catheter was advanced over the Bentson wire to the aortic bifurcation. Pelvic angiogram performed. Omni Flush catheter was then used to navigate the Bentson wire into the left external iliac artery. Omni Flush catheter was removed, and 100 cm vertebral catheter was advanced into the external iliac artery. Angiogram of the left lower extremity was performed. Rosen wire was then placed through the vertebral catheter and the 5 French short sheath was exchanged for a 6 [place] braided 65cm Arrow sheath. Sheath was advanced into the proximal superficial femoral artery. Multiple obliquities at the left knee were required in order to visualize the occlusion of the popliteal artery given the left knee arthroplasty hardware. Once adequate angiogram was performed, the proximal occlusion was engaged with the vertebral catheter and an 035 Coons wire. The occlusion was traversed with the vertebral catheter, with the vertebral catheter in the below knee popliteal artery. Wire was removed and a luminal position was confirmed with injection. 014 BMW wire was then advanced  through the vertebral catheter and the vertebral catheter was removed. Balloon angioplasty was then performed to a diameter of 4 mm along the length of the abnormal distal superficial femoral artery and the popliteal occlusion into the below knee popliteal artery. A Supera 4 mm x 80 mm was deployed in the popliteal artery across the occluded site, with the stent structure extended proximally with a 5 mm x 80 mm stent. Repeat angiogram  was performed. Balloon angioplasty was then performed along the length of the stent site with 5 mm by 60 mm. Combination of angled CXi catheter and 016 Fathom wire were then used to engage the distal tibioperoneal trunk occlusion and the posterior tibial artery occlusion. This catheter and wire combination were successful with traversing the inclusion. Once the wire was removed and a luminal position was confirmed, the BMW wire was advanced through the CXI catheter into the posterior tibial artery. Balloon angioplasty was performed with 3 mm diameter balloon. Angiogram was performed, confirming patency to the ankle. All catheters and wires were removed. Final angiogram was performed. The sheath was then withdrawn to the right external iliac artery and angiogram was performed. The braided sheath was then exchanged for a short 6 [place] sheath. Given that the ACT greater than 150 at the time of conclusion, sheath remain in place on a pressurized saline bag while heparin was metabolites. The patient tolerated the procedure well and remained hemodynamically stable throughout. No complications were encountered and no significant blood loss. FINDINGS: Ultrasound survey of the right common femoral artery demonstrates mild calcifications with patent vessel. Angiogram of the pelvic vasculature demonstrates dense calcium with mild atherosclerotic changes bilaterally. Bilateral L4 and L5 lumbar arteries are patent. Bilateral hypogastric arteries are patent with patent pelvic vasculature. No significant stenosis of the right or left external iliac arteries. Angiogram of the left lower extremity demonstrates mild disease of the proximal left superficial femoral artery with patent profunda femoris. 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 The below knee popliteal artery is patent from collateral flow.  Anterior tibial artery origin is patent, occluded in the proximal third beyond the origin. Distal tibioperoneal trunk is occluded with occlusion extending into the proximal posterior tibial artery and peroneal artery. Collateral flow reconstitutes the distal peroneal artery, posterior tibial artery, and anterior tibial artery which remain patent at the ankle. There is moderate disease of the distal posterior tibial artery involving the common plantar, with the lateral plantar artery remain patent. Status post treatment of popliteal occlusion and placement of overlapping stent system proximally into the disease superficial femoral artery, there is excellent flow through the SFA and popliteal artery with no dissection. Continued flow through the proximal anterior tibial artery. Status post treatment of tibioperoneal occlusion and revascularization of the posterior tibial artery there is in-line flow restored through the posterior tibial artery to the ankle. At the conclusion of the case there is palpable posterior tibial pulse on the left. IMPRESSION: Status post left lower extremity angiogram and revascularization of chronic total occlusion of popliteal artery, distal TP trunk, and origin of the posterior tibial artery with restoration of in-line flow to the ankle via the posterior tibial artery after stenting of the popliteal occlusion. Signed, Dulcy Fanny. Earleen Newport DO [facility] Radiology PLAN: The right common femoral artery sheath will stay until with the ACT is below 200. Plan for closure with Exoseal.  Gentle hydration IV. Right leg straight until the sheath is out. After the sheath is out the right hip will stay straight for 4 hours. Plan on DC home today. Should the patient have plateau of wound healing, the next target would be left anterior tibial artery in the angiosome, with previous discussion of tibial access and anterior tibial artery revascularization.  Electronically Signed   By: Corrie Mckusick D.O.   On: 03/25/2017 17:16   Ir Fem Pop Art Stent Inc Pta Mod Sed  Result Date: 03/25/2017 INDICATION: 83 year old female with a history of left lower extremity critical limb ischemia and a nonhealing amputation site. Prior ankle brachial index measures in the mild range of arterial occlusive disease, with the segmental exam demonstrating evidence of a distal femoral or popliteal occlusion. CT performed 03/04/2017 confirms popliteal occlusion with questionable patency of the tibial vessels. She presents today for an angiogram and attempted treatment of the popliteal occlusion. EXAM: ULTRASOUND GUIDED ACCESS OF RIGHT COMMON FEMORAL ARTERY ANGIOGRAM LEFT LOWER EXTREMITY REVASCULARIZATION OF OCCLUDED POPLITEAL ARTERY WITH ANGIOPLASTY AND STENTING. REVASCULARIZATION OF OCCLUDED TIBIOPERONEAL ARTERY AND POSTERIOR TIBIAL ARTERY WITH BALLOON ANGIOPLASTY AND RESTORATION OF IN-LINE FLOW TO THE ANKLE MEDICATIONS: 200 MCG NITROGLYCERIN INTRA-ARTERIAL 8000 UNITS HEPARIN FOR ANTI COAGULATION 5 MG HYDRALAZINE ANESTHESIA/SEDATION: Moderate (conscious) sedation was employed during this procedure. A total of Versed 2.0 mg and Fentanyl 100 mcg was administered intravenously. Moderate Sedation Time: 180 minutes. The patient's level of consciousness and vital signs were monitored continuously by radiology nursing throughout the procedure under my direct supervision. CONTRAST:  200 cc Isovue-300 FLUOROSCOPY TIME:  Fluoroscopy Time: 38 minutes 12 seconds (104.9 mGy). COMPLICATIONS: None PROCEDURE: Informed consent was obtained from the patient following explanation of the procedure, risks, benefits and alternatives. The patient understands, agrees and consents for the procedure. All questions were addressed. A time out was performed prior to the initiation of the procedure. Maximal barrier sterile technique utilized including caps, mask, sterile gowns, sterile gloves, large sterile drape,  hand hygiene, and Betadine prep. Ultrasound survey of the right inguinal region was performed with images stored and sent to PACs. A micropuncture needle was used access the right common femoral artery under ultrasound. With excellent arterial blood flow returned, and an .018 micro wire was passed through the needle, observed enter the abdominal aorta under fluoroscopy. The needle was removed, and a micropuncture sheath was placed over the wire. The inner dilator and wire were removed, and an 035 Bentson wire was advanced under fluoroscopy into the abdominal aorta. The sheath was removed and a standard 5 [place] vascular sheath was placed. The dilator was removed and the sheath was flushed. Omni Flush catheter was advanced over the Bentson wire to the aortic bifurcation. Pelvic angiogram performed. Omni Flush catheter was then used to navigate the Bentson wire into the left external iliac artery. Omni Flush catheter was removed, and 100 cm vertebral catheter was advanced into the external iliac artery. Angiogram of the left lower extremity was performed. Rosen wire was then placed through the vertebral catheter and the 5 French short sheath was exchanged for a 6 [place] braided 65cm Arrow sheath. Sheath was advanced into the proximal superficial femoral artery. Multiple obliquities at the left knee were required in order to visualize the occlusion of the popliteal artery given the left knee arthroplasty hardware. Once adequate angiogram was performed, the proximal occlusion was engaged with the vertebral catheter and an 035 Co[place] wire. The occlusion was traversed with the vertebral catheter, with the vertebral catheter  in the below knee popliteal artery. Wire was removed and a luminal position was confirmed with injection. 014 BMW wire was then advanced through the vertebral catheter and the vertebral catheter was removed. Balloon angioplasty was then performed to a diameter of 4 mm along the length of the abnormal  distal superficial femoral artery and the popliteal occlusion into the below knee popliteal artery. A Supera 4 mm x 80 mm was deployed in the popliteal artery across the occluded site, with the stent structure extended proximally with a 5 mm x 80 mm stent. Repeat angiogram was performed. Balloon angioplasty was then performed along the length of the stent site with 5 mm by 60 mm. Combination of angled CXi catheter and 016 Fathom wire were then used to engage the distal tibioperoneal trunk occlusion and the posterior tibial artery occlusion. This catheter and wire combination were successful with traversing the inclusion. Once the wire was removed and a luminal position was confirmed, the BMW wire was advanced through the CXI catheter into the posterior tibial artery. Balloon angioplasty was performed with 3 mm diameter balloon. Angiogram was performed, confirming patency to the ankle. All catheters and wires were removed. Final angiogram was performed. The sheath was then withdrawn to the right external iliac artery and angiogram was performed. The braided sheath was then exchanged for a short 6 [place] sheath. Given that the ACT greater than 150 at the time of conclusion, sheath remain in place on a pressurized saline bag while heparin was metabolites. The patient tolerated the procedure well and remained hemodynamically stable throughout. No complications were encountered and no significant blood loss. FINDINGS: Ultrasound survey of the right common femoral artery demonstrates mild calcifications with patent vessel. Angiogram of the pelvic vasculature demonstrates dense calcium with mild atherosclerotic changes bilaterally. Bilateral L4 and L5 lumbar arteries are patent. Bilateral hypogastric arteries are patent with patent pelvic vasculature. No significant stenosis of the right or left external iliac arteries. Angiogram of the left lower extremity demonstrates mild disease of the proximal left superficial femoral  artery with patent profunda femoris. 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 The below knee popliteal artery is patent from collateral flow. Anterior tibial artery origin is patent, occluded in the proximal third beyond the origin. Distal tibioperoneal trunk is occluded with occlusion extending into the proximal posterior tibial artery and peroneal artery. Collateral flow reconstitutes the distal peroneal artery, posterior tibial artery, and anterior tibial artery which remain patent at the ankle. There is moderate disease of the distal posterior tibial artery involving the common plantar, with the lateral plantar artery remain patent. Status post treatment of popliteal occlusion and placement of overlapping stent system proximally into the disease superficial femoral artery, there is excellent flow through the SFA and popliteal artery with no dissection. Continued flow through the proximal anterior tibial artery. Status post treatment of tibioperoneal occlusion and revascularization of the posterior tibial artery there is in-line flow restored through the posterior tibial artery to the ankle. At the conclusion of the case there is palpable posterior tibial pulse on the left. IMPRESSION: Status post left lower extremity angiogram and revascularization of chronic total occlusion of popliteal artery, distal TP trunk, and origin of the posterior tibial artery with restoration of in-line flow to the ankle via the posterior tibial artery after stenting of the popliteal occlusion. Signed, Dulcy Fanny. Earleen Newport DO [facility]  [facility] Radiology PLAN: The right common femoral artery sheath will stay until with the ACT is below 200. Plan for closure with Exoseal. Gentle hydration IV. Right leg straight until the sheath is out. After the sheath is out the right hip  will stay straight for 4 hours. Plan on DC home today. Should the patient have plateau of wound healing, the next target would be left anterior tibial artery in the angiosome, with previous discussion of tibial access and anterior tibial artery revascularization. Electronically Signed   By: Corrie Mckusick D.O.   On: 03/25/2017 17:16   Ir Tib-pero Art Pta Mod Sed  Result Date: 03/25/2017 INDICATION: 83 year old female with a history of left lower extremity critical limb ischemia and a nonhealing amputation site. Prior ankle brachial index measures in the mild range of arterial occlusive disease, with the segmental exam demonstrating evidence of a distal femoral or popliteal occlusion. CT performed 03/04/2017 confirms popliteal occlusion with questionable patency of the tibial vessels. She presents today for an angiogram and attempted treatment of the popliteal occlusion. EXAM: ULTRASOUND GUIDED ACCESS OF RIGHT COMMON FEMORAL ARTERY ANGIOGRAM LEFT LOWER EXTREMITY REVASCULARIZATION OF OCCLUDED POPLITEAL ARTERY WITH ANGIOPLASTY AND STENTING. REVASCULARIZATION OF OCCLUDED TIBIOPERONEAL ARTERY AND POSTERIOR TIBIAL ARTERY WITH BALLOON ANGIOPLASTY AND RESTORATION OF IN-LINE FLOW TO THE ANKLE MEDICATIONS: 200 MCG NITROGLYCERIN INTRA-ARTERIAL 8000 UNITS HEPARIN FOR ANTI COAGULATION 5 MG HYDRALAZINE ANESTHESIA/SEDATION: Moderate (conscious) sedation was employed during this procedure. A total of Versed 2.0 mg and Fentanyl 100 mcg was administered intravenously. Moderate Sedation Time: 180 minutes. The patient's level of consciousness and vital signs were monitored continuously by radiology nursing throughout the procedure under my direct supervision. CONTRAST:  200 cc Isovue-300 FLUOROSCOPY TIME:  Fluoroscopy Time: 38 minutes 12 seconds (104.9 mGy). COMPLICATIONS: None PROCEDURE: Informed consent was obtained from the patient following explanation of the procedure, risks, benefits and alternatives. The patient understands,  agrees and consents for the procedure. All questions were addressed. A time out was performed prior to the initiation of the procedure. Maximal barrier sterile technique utilized including caps, mask, sterile gowns, sterile gloves, large sterile drape, hand hygiene, and Betadine prep. Ultrasound survey of the right inguinal region was performed with images stored and sent to PACs. A micropuncture needle was used access the right common femoral artery under ultrasound. With excellent arterial blood flow returned, and an .018 micro wire was passed through the needle, observed enter the abdominal aorta under fluoroscopy. The needle was removed, and a micropuncture sheath was placed over the wire. The inner dilator and wire were removed, and an 035 Bentson wire was advanced under fluoroscopy into the abdominal aorta. The sheath was removed and a standard 5 [place] vascular sheath was placed. The dilator was removed and the sheath was flushed. Omni Flush catheter was advanced over the Bentson wire to the aortic bifurcation. Pelvic angiogram performed. Omni Flush catheter was then used to navigate the Bentson wire into the left external iliac artery. Omni Flush catheter was removed, and 100 cm vertebral catheter was advanced into the external iliac artery. Angiogram of the left lower extremity was performed. Rosen wire was then placed through the vertebral catheter and the 5 French short sheath was exchanged for a 6 [place] braided 65cm Arrow sheath. Sheath was advanced into the proximal superficial femoral artery. Multiple obliquities at the left knee were required in order to visualize the occlusion of the popliteal artery given the left knee arthroplasty hardware. Once adequate angiogram was performed, the proximal occlusion was engaged with  the vertebral catheter and an 035 Coons wire. The occlusion was traversed with the vertebral catheter, with the vertebral catheter in the below knee popliteal artery. Wire was  removed and a luminal position was confirmed with injection. 014 BMW wire was then advanced through the vertebral catheter and the vertebral catheter was removed. Balloon angioplasty was then performed to a diameter of 4 mm along the length of the abnormal distal superficial femoral artery and the popliteal occlusion into the below knee popliteal artery. A Supera 4 mm x 80 mm was deployed in the popliteal artery across the occluded site, with the stent structure extended proximally with a 5 mm x 80 mm stent. Repeat angiogram was performed. Balloon angioplasty was then performed along the length of the stent site with 5 mm by 60 mm. Combination of angled CXi catheter and 016 Fathom wire were then used to engage the distal tibioperoneal trunk occlusion and the posterior tibial artery occlusion. This catheter and wire combination were successful with traversing the inclusion. Once the wire was removed and a luminal position was confirmed, the BMW wire was advanced through the CXI catheter into the posterior tibial artery. Balloon angioplasty was performed with 3 mm diameter balloon. Angiogram was performed, confirming patency to the ankle. All catheters and wires were removed. Final angiogram was performed. The sheath was then withdrawn to the right external iliac artery and angiogram was performed. The braided sheath was then exchanged for a short 6 [place] sheath. Given that the ACT greater than 150 at the time of conclusion, sheath remain in place on a pressurized saline bag while heparin was metabolites. The patient tolerated the procedure well and remained hemodynamically stable throughout. No complications were encountered and no significant blood loss. FINDINGS: Ultrasound survey of the right common femoral artery demonstrates mild calcifications with patent vessel. Angiogram of the pelvic vasculature demonstrates dense calcium with mild atherosclerotic changes bilaterally. Bilateral L4 and L5 lumbar arteries are  patent. Bilateral hypogastric arteries are patent with patent pelvic vasculature. No significant stenosis of the right or left external iliac arteries. Angiogram of the left lower extremity demonstrates mild disease of the proximal left superficial femoral artery with patent profunda femoris. 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 The below knee popliteal artery is patent from collateral flow. Anterior tibial artery origin is patent, occluded in the proximal third beyond the origin. Distal tibioperoneal trunk is occluded with occlusion extending into the proximal posterior tibial artery and peroneal artery. Collateral flow reconstitutes the distal peroneal artery, posterior tibial artery, and anterior tibial artery which remain patent at the ankle. There is moderate disease of the distal posterior tibial artery involving the common plantar, with the lateral plantar artery remain patent. Status post treatment of popliteal occlusion and placement of overlapping stent system proximally into the disease superficial femoral artery, there is excellent flow through the SFA and popliteal artery with no dissection. Continued flow through the proximal anterior tibial artery. Status post treatment of tibioperoneal occlusion and revascularization of the posterior tibial artery there is in-line flow restored through the posterior tibial artery to the ankle. At the conclusion of the case there is palpable posterior tibial pulse on the left. IMPRESSION: Status post left lower extremity angiogram and revascularization of chronic total occlusion of popliteal artery, distal TP trunk, and origin of the posterior tibial artery with restoration of in-line flow to the ankle via  the posterior tibial artery after stenting of the popliteal occlusion. Signed, Dulcy Fanny. Earleen Newport DO [facility]  [facility] Radiology PLAN: The right common femoral artery sheath will stay until with the ACT is below 200. Plan for closure with Exoseal. Gentle hydration IV. Right leg straight until the sheath is out. After the sheath is out the right hip will stay straight for 4 hours. Plan on DC home today. Should the patient have plateau of wound healing, the next target would be left anterior tibial artery in the angiosome, with previous discussion of tibial access and anterior tibial artery revascularization. Electronically Signed   By: Corrie Mckusick D.O.   On: 03/25/2017 17:16   Ir US Guide Vasc Access Right  Result Date: 04/08/2017 INDICATION: 83 year old female with acute thrombosis of left superficial femoral artery stent system. SFA and popliteal stent system placed 03/25/2017 for treatment of critical limb ischemia and nonhealing wound. She returns today for initiation of thrombolysis and treatment EXAM: ULTRASOUND GUIDED ACCESS RIGHT COMMON FEMORAL ARTERY ANGIOGRAM LEFT LOWER EXTREMITY INITIATION OF ARTERIAL THROMBOLYSIS WITH CATHETER DIRECTED THERAPY MEDICATIONS: None ANESTHESIA/SEDATION: Moderate (conscious) sedation was employed during this procedure. A total of Versed 2.0 mg and Fentanyl 100 mcg was administered intravenously. Moderate Sedation Time: 45 minutes. The patient's level of consciousness and vital signs were monitored continuously by radiology nursing throughout the procedure under my direct supervision. CONTRAST:  40 cc Isovue FLUOROSCOPY TIME:  Fluoroscopy Time: 10 minutes 24 seconds (67 mGy). COMPLICATIONS: None PROCEDURE: Informed consent was obtained from the patient following explanation of the procedure, risks, benefits and alternatives. The patient understands, agrees and consents for the procedure. All questions were addressed. A time out was performed prior to the initiation of the procedure. Maximal barrier sterile technique utilized including caps, mask, sterile gowns,  sterile gloves, large sterile drape, hand hygiene, and Betadine prep. Ultrasound survey of the right inguinal region was performed with images stored and sent to PACs. A micropuncture needle was used access the right common femoral artery under ultrasound. With excellent arterial blood flow returned, and an .018 micro wire was passed through the needle, observed enter the abdominal aorta under fluoroscopy. The needle was removed, and a micropuncture sheath was placed over the wire. The inner dilator and wire were removed, and an 035 Bentson wire was advanced under fluoroscopy into the abdominal aorta. The sheath was removed and a standard 5 [place] vascular sheath was placed. The dilator was removed and the sheath was flushed. Omni Flush catheter was advanced over the Bentson wire into the abdominal aorta and the Bentson wire was retracted to form the curve on the catheter. Catheter and wire were then directed down the contralateral common iliac artery. The wire was advanced to the superficial femoral artery. Catheter was removed an a 5 French vertebral catheter was placed. Angiogram of the left lower extremity was then performed. Rosen wire was then passed through the catheter into the superficial femoral artery in the catheter was removed. Sheath was removed. A 6 French 45 cm Arrow braided sheath was then placed over the wire into the distal external iliac artery. Vertebral catheter was then placed over the Rosen wire in the [place] wire was removed. Angiogram was performed. Combination of Rosen wire and the vertebral catheter were then used to traverse the proximal 2/3 of the thrombosed superficial femoral artery. When the catheter reached the popliteal artery there was significant resistance. Vertebral catheter was removed and a Uni fuse infusion catheter, 135 cm length, 40  cm infusion length was selected. This was placed over the [facility] wire. Rosen wire was then was removed and a stiff Glidewire was then used. The  tip of the Uni fuse catheter was slowly advanced into the origin of the posterior tibial artery. Glidewire was removed, limited angiogram was performed, and then the obturator wire was placed. Patient tolerated the procedure well. Patient remained hemodynamically stable throughout. No complications were encountered.  No significant blood loss FINDINGS: Angiogram demonstrates occlusion of the stent system of the distal superficial femoral artery an the popliteal artery. There is no significant reconstitution of flow into the distal tibial arteries, however, patency of the distal popliteal artery, tibioperoneal trunk, and the proximal 1/3 of the anterior tibial artery was evident. The proximal posterior tibial artery was occluded. The anterior tibial artery is again occluded proximally, which was the case on the prior angiogram. There is critical stenosis at the origin of the peroneal artery. After placement of the infusion catheter, injection demonstrates partial filling of the posterior tibial artery. IMPRESSION: Status post ultrasound guided access of right common femoral artery and placement of a 40 cm length infusion catheter across acute thrombosis of the left superficial femoral artery and popliteal artery for initiation of thrombolysis. Signed, Dulcy Fanny. Earleen Newport, DO [facility] Electronically Signed   By: Corrie Mckusick D.O.   On: 04/08/2017 13:41   Ir US Guide Vasc Access Right  Result Date: 03/25/2017 INDICATION: 83 year old female with a history of left lower extremity critical limb ischemia and a nonhealing amputation site. Prior ankle brachial index measures in the mild range of arterial occlusive disease, with the segmental exam demonstrating evidence of a distal femoral or popliteal occlusion. CT performed 03/04/2017 confirms popliteal occlusion with questionable patency of the tibial vessels. She presents today for an angiogram and attempted  treatment of the popliteal occlusion. EXAM: ULTRASOUND GUIDED ACCESS OF RIGHT COMMON FEMORAL ARTERY ANGIOGRAM LEFT LOWER EXTREMITY REVASCULARIZATION OF OCCLUDED POPLITEAL ARTERY WITH ANGIOPLASTY AND STENTING. REVASCULARIZATION OF OCCLUDED TIBIOPERONEAL ARTERY AND POSTERIOR TIBIAL ARTERY WITH BALLOON ANGIOPLASTY AND RESTORATION OF IN-LINE FLOW TO THE ANKLE MEDICATIONS: 200 MCG NITROGLYCERIN INTRA-ARTERIAL 8000 UNITS HEPARIN FOR ANTI COAGULATION 5 MG HYDRALAZINE ANESTHESIA/SEDATION: Moderate (conscious) sedation was employed during this procedure. A total of Versed 2.0 mg and Fentanyl 100 mcg was administered intravenously. Moderate Sedation Time: 180 minutes. The patient's level of consciousness and vital signs were monitored continuously by radiology nursing throughout the procedure under my direct supervision. CONTRAST:  200 cc Isovue-300 FLUOROSCOPY TIME:  Fluoroscopy Time: 38 minutes 12 seconds (104.9 mGy). COMPLICATIONS: None PROCEDURE: Informed consent was obtained from the patient following explanation of the procedure, risks, benefits and alternatives. The patient understands, agrees and consents for the procedure. All questions were addressed. A time out was performed prior to the initiation of the procedure. Maximal barrier sterile technique utilized including caps, mask, sterile gowns, sterile gloves, large sterile drape, hand hygiene, and Betadine prep. Ultrasound survey of the right inguinal region was performed with images stored and sent to PACs. A micropuncture needle was used access the right common femoral artery under ultrasound. With excellent arterial blood flow returned, and an .018 micro wire was passed through the needle, observed enter the abdominal aorta under fluoroscopy. The needle was removed, and a micropuncture sheath was placed over the wire. The inner dilator and wire were removed, and an 035 Bentson wire was advanced under fluoroscopy into the abdominal aorta. The sheath was removed  and a standard 5 [place]  vascular sheath was placed. The dilator was removed and the sheath was flushed. Omni Flush catheter was advanced over the Bentson wire to the aortic bifurcation. Pelvic angiogram performed. Omni Flush catheter was then used to navigate the Bentson wire into the left external iliac artery. Omni Flush catheter was removed, and 100 cm vertebral catheter was advanced into the external iliac artery. Angiogram of the left lower extremity was performed. Rosen wire was then placed through the vertebral catheter and the 5 French short sheath was exchanged for a 6 [place] braided 65cm Arrow sheath. Sheath was advanced into the proximal superficial femoral artery. Multiple obliquities at the left knee were required in order to visualize the occlusion of the popliteal artery given the left knee arthroplasty hardware. Once adequate angiogram was performed, the proximal occlusion was engaged with the vertebral catheter and an 035 [place] wire. The occlusion was traversed with the vertebral catheter, with the vertebral catheter in the below knee popliteal artery. Wire was removed and a luminal position was confirmed with injection. 014 BMW wire was then advanced through the vertebral catheter and the vertebral catheter was removed. Balloon angioplasty was then performed to a diameter of 4 mm along the length of the abnormal distal superficial femoral artery and the popliteal occlusion into the below knee popliteal artery. A Supera 4 mm x 80 mm was deployed in the popliteal artery across the occluded site, with the stent structure extended proximally with a 5 mm x 80 mm stent. Repeat angiogram was performed. Balloon angioplasty was then performed along the length of the stent site with 5 mm by 60 mm. Combination of angled CXi catheter and 016 Fathom wire were then used to engage the distal tibioperoneal trunk occlusion and the posterior tibial artery occlusion. This catheter and wire combination were successful  with traversing the inclusion. Once the wire was removed and a luminal position was confirmed, the BMW wire was advanced through the CXI catheter into the posterior tibial artery. Balloon angioplasty was performed with 3 mm diameter balloon. Angiogram was performed, confirming patency to the ankle. All catheters and wires were removed. Final angiogram was performed. The sheath was then withdrawn to the right external iliac artery and angiogram was performed. The braided sheath was then exchanged for a short 6 [place] sheath. Given that the ACT greater than 150 at the time of conclusion, sheath remain in place on a pressurized saline bag while heparin was metabolites. The patient tolerated the procedure well and remained hemodynamically stable throughout. No complications were encountered and no significant blood loss. FINDINGS: Ultrasound survey of the right common femoral artery demonstrates mild calcifications with patent vessel. Angiogram of the pelvic vasculature demonstrates dense calcium with mild atherosclerotic changes bilaterally. Bilateral L4 and L5 lumbar arteries are patent. Bilateral hypogastric arteries are patent with patent pelvic vasculature. No significant stenosis of the right or left external iliac arteries. Angiogram of the left lower extremity demonstrates mild disease of the proximal left superficial femoral artery with patent profunda femoris. 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 The below knee popliteal artery is patent from collateral flow. Anterior tibial artery origin is patent, occluded in the proximal third beyond the origin. Distal tibioperoneal trunk is occluded with occlusion extending into the proximal posterior tibial artery and peroneal artery. Collateral flow reconstitutes the distal peroneal artery, posterior tibial artery, and anterior tibial artery  which remain patent  at the ankle. There is moderate disease of the distal posterior tibial artery involving the common plantar, with the lateral plantar artery remain patent. Status post treatment of popliteal occlusion and placement of overlapping stent system proximally into the disease superficial femoral artery, there is excellent flow through the SFA and popliteal artery with no dissection. Continued flow through the proximal anterior tibial artery. Status post treatment of tibioperoneal occlusion and revascularization of the posterior tibial artery there is in-line flow restored through the posterior tibial artery to the ankle. At the conclusion of the case there is palpable posterior tibial pulse on the left. IMPRESSION: Status post left lower extremity angiogram and revascularization of chronic total occlusion of popliteal artery, distal TP trunk, and origin of the posterior tibial artery with restoration of in-line flow to the ankle via the posterior tibial artery after stenting of the popliteal occlusion. Signed, Dulcy Fanny. Earleen Newport DO [facility] Radiology PLAN: The right common femoral artery sheath will stay until with the ACT is below 200. Plan for closure with Exoseal. Gentle hydration IV. Right leg straight until the sheath is out. After the sheath is out the right hip will stay straight for 4 hours. Plan on DC home today. Should the patient have plateau of wound healing, the next target would be left anterior tibial artery in the angiosome, with previous discussion of tibial access and anterior tibial artery revascularization. Electronically Signed   By: Corrie Mckusick D.O.   On: 03/25/2017 17:16   Ir Infusion Thrombol Arterial Initial (ms)  Result Date: 04/08/2017 INDICATION: 83 year old female with acute thrombosis of left superficial femoral artery stent system. SFA and popliteal stent system placed 03/25/2017 for treatment of critical limb  ischemia and nonhealing wound. She returns today for initiation of thrombolysis and treatment EXAM: ULTRASOUND GUIDED ACCESS RIGHT COMMON FEMORAL ARTERY ANGIOGRAM LEFT LOWER EXTREMITY INITIATION OF ARTERIAL THROMBOLYSIS WITH CATHETER DIRECTED THERAPY MEDICATIONS: None ANESTHESIA/SEDATION: Moderate (conscious) sedation was employed during this procedure. A total of Versed 2.0 mg and Fentanyl 100 mcg was administered intravenously. Moderate Sedation Time: 45 minutes. The patient's level of consciousness and vital signs were monitored continuously by radiology nursing throughout the procedure under my direct supervision. CONTRAST:  40 cc Isovue FLUOROSCOPY TIME:  Fluoroscopy Time: 10 minutes 24 seconds (67 mGy). COMPLICATIONS: None PROCEDURE: Informed consent was obtained from the patient following explanation of the procedure, risks, benefits and alternatives. The patient understands, agrees and consents for the procedure. All questions were addressed. A time out was performed prior to the initiation of the procedure. Maximal barrier sterile technique utilized including caps, mask, sterile gowns, sterile gloves, large sterile drape, hand hygiene, and Betadine prep. Ultrasound survey of the right inguinal region was performed with images stored and sent to PACs. A micropuncture needle was used access the right common femoral artery under ultrasound. With excellent arterial blood flow returned, and an .018 micro wire was passed through the needle, observed enter the abdominal aorta under fluoroscopy. The needle was removed, and a micropuncture sheath was placed over the wire. The inner dilator and wire were removed, and an 035 Bentson wire was advanced under fluoroscopy into the abdominal aorta. The sheath was removed and a standard 5 [place] vascular sheath was placed. The dilator was removed and the sheath was flushed. Omni Flush catheter was advanced over the Bentson wire into the abdominal aorta and the Bentson wire  was retracted to form the curve on the catheter. Catheter and wire were then directed down  the contralateral common iliac artery. The wire was advanced to the superficial femoral artery. Catheter was removed an a 5 French vertebral catheter was placed. Angiogram of the left lower extremity was then performed. Rosen wire was then passed through the catheter into the superficial femoral artery in the catheter was removed. Sheath was removed. A 6 French 45 cm Arrow braided sheath was then placed over the wire into the distal external iliac artery. Vertebral catheter was then placed over the Rosen wire in the [place] wire was removed. Angiogram was performed. Combination of Rosen wire and the vertebral catheter were then used to traverse the proximal 2/3 of the thrombosed superficial femoral artery. When the catheter reached the popliteal artery there was significant resistance. Vertebral catheter was removed and a Uni fuse infusion catheter, 135 cm length, 40 cm infusion length was selected. This was placed over the [facility] wire. Rosen wire was then was removed and a stiff Glidewire was then used. The tip of the Uni fuse catheter was slowly advanced into the origin of the posterior tibial artery. Glidewire was removed, limited angiogram was performed, and then the obturator wire was placed. Patient tolerated the procedure well. Patient remained hemodynamically stable throughout. No complications were encountered.  No significant blood loss FINDINGS: Angiogram demonstrates occlusion of the stent system of the distal superficial femoral artery an the popliteal artery. There is no significant reconstitution of flow into the distal tibial arteries, however, patency of the distal popliteal artery, tibioperoneal trunk, and the proximal 1/3 of the anterior tibial artery was evident. The proximal posterior tibial artery was occluded. The anterior tibial artery is again occluded proximally, which was the case on the prior  angiogram. There is critical stenosis at the origin of the peroneal artery. After placement of the infusion catheter, injection demonstrates partial filling of the posterior tibial artery. IMPRESSION: Status post ultrasound guided access of right common femoral artery and placement of a 40 cm length infusion catheter across acute thrombosis of the left superficial femoral artery and popliteal artery for initiation of thrombolysis. Signed, Dulcy Fanny. Earleen Newport, DO [facility] Radiology Electronically Signed   By: Corrie Mckusick D.O.   On: 04/08/2017 13:41   Ir Jacolyn Reedy F/u Eval Art/ven Final Day (ms)  Result Date: 04/09/2017 INDICATION: Chronic left limb ischemia, status post revascularization of chronic total occlusion of popliteal artery through proximal posterior tibial artery 03/25/2017. Patient presented with distal occlusion, and is status post overnight catheter directed thrombolytic infusion. EXAM: LEFT LOWER EXTREMITY ARTERIOGRAM THROUGH EXISTING CATHETER MEDICATIONS: None required ANESTHESIA/SEDATION: None required CONTRAST:  60 mL Isovue-300 FLUOROSCOPY TIME:  3 minutes, 57 mGy COMPLICATIONS: None immediate. PROCEDURE: Informed consent was obtained from the patient following explanation of the procedure, risks, benefits and alternatives. The patient understands, agrees and consents for the procedure. All questions were addressed. A time out was performed prior to the initiation of the procedure. Contrast was injected through previously placed the right femoral sheath for left lower extremity arteriography. At the conclusion of the procedure, the infusion catheter was removed. The patient tolerated the procedure well. FINDINGS: Patent left profunda femoris. Atheromatous SFA with tandem areas of mild to moderate stenosis in its mid portion. Clearance of thrombus from the popliteal arterial stent. No residual thrombus or stenosis. Origin occlusion of the anterior  tibial artery. Short-segment moderate stenosis just beyond the origin of the tibioperoneal trunk. Patent posterior tibial artery to just below the distal calf above the ankle as before ; no direct outflow  to the plantar arch. Short-segment origin stenosis of the peroneal artery of at least moderate severity, patent distally with a prominent anterior communicating branch crossing the ankle. Reconstitution of the distal anterior tibial artery the mid calf level, patent across the ankle as dorsalis pedis. IMPRESSION: 1. Interval complete clearance of acute left lower extremity arterial thrombus after overnight catheter directed thrombolytic infusion. 2. Restoration of inline flow through the posterior tibial artery to the ankle. 3. Tandem stenoses in the mid SFA and tibioperoneal trunk. 4. Collateral reconstitution of peroneal and anterior tibial distal runoff as above. Electronically Signed   By: Lucrezia Europe M.D.   On: 04/09/2017 15:02     CBC  Recent Labs Lab 04/07/17 0850 04/08/17 1037 04/09/17 0127 04/09/17 0552 04/12/17 1452 04/12/17 1628 04/13/17 0022  WBC 10.7* 20.1* 15.4* 13.8* 14.1*  --  20.1*  HGB 14.7 14.8 13.4 13.1 10.6* 10.5* 11.7*  HCT 45.0 45.3 40.4 39.0 33.1* 31.0* 35.2*  PLT 322 269 218 235 239  --  183  MCV 95.9 96.0 95.1 96.1 95.9  --  90.0  MCH 31.3 31.4 31.5 32.3 30.7  --  29.9  MCHC 32.7 32.7 33.2 33.6 32.0  --  33.2  RDW 13.3 13.1 13.2 13.3 13.4  --  16.7*  LYMPHSABS 1.3  --   --   --  1.9  --   --   MONOABS 0.6  --   --   --  0.9  --   --   EOSABS 0.2  --   --   --  0.3  --   --   BASOSABS 0.0  --   --   --  0.0  --   --     Chemistries   Recent Labs Lab 04/07/17 0850 04/12/17 1452 04/12/17 1628 04/13/17 0022  NA 138 140 142 135  K 3.6 3.7 4.8 4.5  CL 101 106  --  105  CO2 25 22  --  22  GLUCOSE 314* 309* 342* 354*  BUN 16 16  --  24*  CREATININE 0.83 1.05*  --  1.25*  CALCIUM 9.0 9.1  --  8.5*    ------------------------------------------------------------------------------------------------------------------ estimated creatinine clearance is 25.6 mL/min (A) (by C-G formula based on SCr of 1.25 mg/dL (H)). ------------------------------------------------------------------------------------------------------------------ No results for input(s): HGBA1C in the last 72 hours. ------------------------------------------------------------------------------------------------------------------ No results for input(s): CHOL, HDL, LDLCALC, TRIG, CHOLHDL, LDLDIRECT in the last 72 hours. ------------------------------------------------------------------------------------------------------------------ No results for input(s): TSH, T4TOTAL, T3FREE, THYROIDAB in the last 72 hours.  Invalid input(s): FREET3 ------------------------------------------------------------------------------------------------------------------ No results for input(s): VITAMINB12, FOLATE, FERRITIN, TIBC, IRON, RETICCTPCT in the last 72 hours.  Coagulation profile  Recent Labs Lab 04/08/17 0701 04/09/17 0552 04/10/17 0313 04/12/17 1452 04/14/17 0244  INR 1.19 1.36 1.27 1.66 1.97    No results for input(s): DDIMER in the last 72 hours.  Cardiac Enzymes No results for input(s): CKMB, TROPONINI, MYOGLOBIN in the last 168 hours.  Invalid input(s): CK ------------------------------------------------------------------------------------------------------------------ Invalid input(s): POCBNP   CBG:  Recent Labs Lab 04/13/17 0834 04/13/17 1244 04/13/17 1715 04/13/17 2152 04/14/17 0620  GLUCAP 232* 292* 218* 162* 128*       Studies: Ct Abdomen Pelvis Wo Contrast  Result Date: 04/12/2017 CLINICAL DATA:  Right inguinal swelling. EXAM: CT ABDOMEN AND PELVIS WITHOUT CONTRAST TECHNIQUE: Multidetector CT imaging of the abdomen and pelvis was performed following the standard protocol without IV contrast.  COMPARISON:  None. FINDINGS: Lower chest: Lung bases are clear. Aortic atherosclerosis noted. Calcifications in the  RCA and LAD coronary artery noted. Hepatobiliary: No focal liver abnormality. Multiple stones identified within the gallbladder. These measure up to 1.2 cm. No gallbladder wall thickening or biliary dilatation. Pancreas: Unremarkable. No pancreatic ductal dilatation or surrounding inflammatory changes. Spleen: Geographic area of low attenuation involving the inferior spleen is identified, new from 03/04/2017. Adrenals/Urinary Tract: The adrenal glands appear normal. Unremarkable appearance of the kidneys. The urinary bladder is within normal limits. Stomach/Bowel: Stomach is within normal limits. Appendix appears normal. No evidence of bowel wall thickening, distention, or inflammatory changes. Vascular/Lymphatic: Aortic atherosclerosis. No aneurysm. No upper abdominal or pelvic adenopathy. Reproductive: The uterus is unremarkable. Left adnexal cyst is again noted measuring 5.3 cm and 7 HU. Other: There is a large well-circumscribed mass within the left inguinal region which extends laterally into the soft tissues overlying the lateral pelvis. The ventral component measures 3.9 x 8.4 by 9.2 cm. The more hyperdense lateral component Measures 8.7 x 4.1 by 7.5 cm. There is diffuse stranding within the surrounding soft tissues. Musculoskeletal: Previous left hip arthroplasty. Suggestion of right hip avascular necrosis, image 81 of series 8. There is degenerative disc disease noted throughout the lumbar spine. Compression fractures are noted at T11 and L5. IMPRESSION: 1. Examination is positive for large complex mass involving the ventral aspect of the right inguinal region and extending laterally to overly the right iliac bone. This is favored to represent a large acute and subacute hematoma. 2. 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 3.  Aortic Atherosclerosis (ICD10-I70.0). 4. Thoracic and lumbar compression  deformities. 5. Gallstones These results were called by telephone at the time of interpretation on 04/12/2017 at 4:02 pm to Dr. Damita Dunnings , who verbally acknowledged these results. Electronically Signed   By: Kerby Moors M.D.   On: 04/12/2017 16:03      No results found for: HGBA1C Lab Results  Component Value Date   CREATININE 1.25 (H) 04/13/2017       Scheduled Meds: . aspirin EC  325 mg Oral QHS  . calcium carbonate  1 tablet Oral Q breakfast  . clopidogrel  75 mg Oral Daily  . docusate sodium  100 mg Oral Daily  . feeding supplement  237 mL Oral TID WC  . ferrous sulfate  325 mg Oral Q breakfast  . insulin aspart  0-5 Units Subcutaneous QHS  . insulin aspart  0-9 Units Subcutaneous TID WC  . mouth rinse  15 mL Mouth Rinse BID  . pantoprazole  40 mg Oral Daily  . vitamin B-12  1,000 mcg Oral Daily  . Vitamin D (Ergocalciferol)  50,000 Units Oral Q Sun   Continuous Infusions: . lactated ringers 75 mL/hr at 04/13/17 2018     LOS: 2 days    Time spent: >30 MINS    Reyne Dumas  [facility] Pager [phone]. If 7PM-7AM, please contact night-coverage at [email or web address], password [facility] 04/14/2017, 8:26 AM  LOS: 2 days

## 2017-04-14 NOTE — Evaluation (Signed)
Occupational Therapy Evaluation Patient Details Name: [name] MRN: [id number] DOB: 09-Jan-1928 Today's Date: 04/14/2017    History of Present Illness 83 yo admitted with right groin hematoma s/p evacuation 5/25 after 5/22 admit for critical ischemia of LLE due to clot of left fem pop BPG performed 03/25/17. s/p thrombectomy 5/23. 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 Pt will benefit from acute OT with continue follow up at SNF to get back to PLOF.    Follow Up Recommendations  SNF;Supervision/Assistance - 24 hour    Equipment Recommendations  Other (comment) (TBD at next venue)       Precautions / Restrictions Precautions Precautions: Fall Precaution Comments: watch HR Restrictions Weight Bearing Restrictions: No      Mobility Bed Mobility               General bed mobility comments: in chair on arrival  Transfers Overall transfer level: Needs assistance   Transfers: Sit to/from Stand Sit to Stand: Min assist         General transfer comment: min assist from chair with bil armrests, assist to block and position feet. Assist for anterior translation and rise. Pt with maintained hip and trunk flexion, feet with anterior propulsion and posterior trunk lean. Pt unable to mobilize further due to Afib with elevated rate to 177 with standing    Balance Overall balance assessment: Needs assistance   Sitting balance-Leahy Scale: Fair       Standing balance-Leahy Scale: Poor                             ADL either performed or assessed with clinical judgement   ADL Overall ADL's : Needs assistance/impaired Eating/Feeding: Independent Eating/Feeding Details (indicate cue type and reason): supported sitting Grooming: Set up;Supervision/safety Grooming Details (indicate cue type and reason):  supported sitting Upper Body Bathing: Supervision/ safety;Set up Upper Body Bathing Details (indicate cue type and reason): supported sitting Lower Body Bathing: Maximal assistance Lower Body Bathing Details (indicate cue type and reason): min A sit<>stand Upper Body Dressing : Moderate assistance Upper Body Dressing Details (indicate cue type and reason): supported sitting Lower Body Dressing: Total assistance Lower Body Dressing Details (indicate cue type and reason): min A sit<>stand   Toilet Transfer Details (indicate cue type and reason): Did not attempt transfer today due to extreme increase in HR which any activity                 Vision Patient Visual Report: No change from baseline              Pertinent Vitals/Pain Pain Assessment: 0-10 Pain Score: 4  Pain Location: sore groin right Pain Descriptors / Indicators: Sore Pain Intervention(s): Limited activity within patient's tolerance;Repositioned;Monitored during session     Hand Dominance  right   Extremity/Trunk Assessment Upper Extremity Assessment Upper Extremity Assessment: Generalized weakness   Lower Extremity Assessment Lower Extremity Assessment: Generalized weakness   Cervical / Trunk Assessment Cervical / Trunk Assessment: Kyphotic   Communication Communication Communication: No difficulties   Cognition Arousal/Alertness: Awake/alert Behavior During Therapy: Flat affect Overall Cognitive Status: Impaired/Different from baseline Area of Impairment: Memory;Safety/judgement;Attention                   Current Attention Level: Selective  Safety/Judgement: Decreased awareness of deficits                    Home Living Family/patient expects to be discharged to:: Skilled nursing facility Living Arrangements: Spouse/significant other;Other relatives Available Help at Discharge: Family;Available PRN/intermittently Type of Home: House Home Access: Ramped entrance     Home  Layout: One level     Bathroom Shower/Tub: Teacher, early years/pre: Standard     Home Equipment: Environmental consultant - 2 wheels;Bedside commode;Tub bench;Cane - single point          Prior Functioning/Environment Level of Independence: Needs assistance  Gait / Transfers Assistance Needed: pt was walking with cane until a few weeks ago but limited to grossly 100', was transferring on her own ADL's / Homemaking Assistance Needed: daughter-in-laws assist with bathing, take out meals, no housekeeping is getting done, pt can do most of her dressing herself- difficulty with shoes            OT Problem List: Decreased strength;Decreased range of motion;Impaired balance (sitting and/or standing);Pain;Cardiopulmonary status limiting activity      OT Treatment/Interventions: Self-care/ADL training;Therapeutic activities;Patient/family education;Balance training;DME and/or AE instruction    OT Goals(Current goals can be found in the care plan section) Acute Rehab OT Goals Patient Stated Goal: return home OT Goal Formulation: With patient Time For Goal Achievement: 04/28/17 Potential to Achieve Goals: Good  OT Frequency: Min 2X/week   Barriers to D/C: Decreased caregiver support          Co-evaluation PT/OT/SLP Co-Evaluation/Treatment: Yes Reason for Co-Treatment: To address functional/ADL transfers   OT goals addressed during session: Strengthening/ROM      AM-PAC PT "6 Clicks" Daily Activity     Outcome Measure Help from another person eating meals?: None Help from another person taking care of personal grooming?: A Little Help from another person toileting, which includes using toliet, bedpan, or urinal?: A Lot Help from another person bathing (including washing, rinsing, drying)?: A Lot Help from another person to put on and taking off regular upper body clothing?: A Lot Help from another person to put on and taking off regular lower body clothing?: A Lot 6 Click Score:  15   End of Session Equipment Utilized During Treatment: Gait belt Nurse Communication: Mobility status (did not attempt to ambulate due to significant increase in HR)  Activity Tolerance:  (limited by significant increase in HR) Patient left: in chair ((pt in recliner upon our arrival))  OT Visit Diagnosis: Unsteadiness on feet (R26.81);Pain Pain - Right/Left: Left Pain - part of body:  (groin)                Time: 8527-7824 OT Time Calculation (min): 20 min Charges:  OT General Charges $OT Visit: 1 Procedure OT Evaluation $OT Eval Moderate Complexity: 1 Procedure Golden Circle, OTR/L [phone] 04/14/2017

## 2017-04-14 NOTE — Evaluation (Addendum)
Physical Therapy Evaluation Patient Details Name: [name] MRN: [id number] DOB: [birth year] Today's Date: 04/14/2017   History of Present Illness  83 yo admitted with right groin hematoma s/p evacuation 5/25 after 5/22 admit for critical ischemia of LLE due to clot of left fem pop BPG performed 03/25/17. s/p thrombectomy 5/23. PMhx: anemia, GERD, HTN, right 5th toe amp, bil BKA, L THA  Clinical Impression  Pt very pleasant with daughter-in-law present and familiar from last admission. Pt with HR 125-155 seated in chair with very fluctuant rate. Attempted standing x 1 due to non-sustained rate but rate rose to 177 with brief standing and sat without further attempts at mobility at this time. Pt without shoes this session and requested family bring them. Pt very motivated to regain function to return home. Pt with decreased strength, mobility, balance and gait with cardiopulmonary deficits who will benefit from acute therapy to maximize mobility, balance and function to return to PLOF.     Follow Up Recommendations SNF;Supervision/Assistance - 24 hour    Equipment Recommendations  Wheelchair cushion (measurements PT);Wheelchair (measurements PT)    Recommendations for Other Services       Precautions / Restrictions Precautions Precautions: Fall Precaution Comments: watch HR Restrictions Weight Bearing Restrictions: No      Mobility  Bed Mobility               General bed mobility comments: in chair on arrival  Transfers Overall transfer level: Needs assistance   Transfers: Sit to/from Stand Sit to Stand: Min assist         General transfer comment: min assist from chair with bil armrests, assist to block and position feet. Assist for anterior translation and rise. Pt with maintained hip and trunk flexion, feet with anterior propulsion and posterior trunk lean. Pt unable to mobilize further due to Afib with elevated rate to 177 with standing  Ambulation/Gait                Stairs            Wheelchair Mobility    Modified Rankin (Stroke Patients Only)       Balance Overall balance assessment: Needs assistance   Sitting balance-Leahy Scale: Fair       Standing balance-Leahy Scale: Poor                               Pertinent Vitals/Pain Pain Assessment: 0-10 Pain Score: 4  Pain Location: sore groin right Pain Descriptors / Indicators: Sore Pain Intervention(s): Limited activity within patient's tolerance;Repositioned    Home Living Family/patient expects to be discharged to:: Skilled nursing facility Living Arrangements: Spouse/significant other;Other relatives Available Help at Discharge: Family;Available PRN/intermittently Type of Home: House Home Access: Ramped entrance     Home Layout: One level Home Equipment: Walker - 2 wheels;Bedside commode;Tub bench;Cane - single point      Prior Function Level of Independence: Needs assistance   Gait / Transfers Assistance Needed: pt was walking with cane until a few weeks ago but limited to grossly 100', was transferring on her own  ADL's / Homemaking Assistance Needed: daughter-in-laws assist with bathing, take out meals, no housekeeping is getting done, pt can do most of her dressing herself- difficulty with shoes        Hand Dominance        Extremity/Trunk Assessment   Upper Extremity Assessment Upper Extremity Assessment: Defer to OT evaluation  Lower Extremity Assessment Lower Extremity Assessment: Generalized weakness    Cervical / Trunk Assessment Cervical / Trunk Assessment: Kyphotic  Communication   Communication: No difficulties  Cognition Arousal/Alertness: Awake/alert Behavior During Therapy: Flat affect Overall Cognitive Status: Impaired/Different from baseline Area of Impairment: Memory;Safety/judgement;Attention                   Current Attention Level: Selective     Safety/Judgement: Decreased awareness of  deficits            General Comments      Exercises     Assessment/Plan    PT Assessment Patient needs continued PT services  PT Problem List Decreased strength;Decreased mobility;Decreased safety awareness;Decreased activity tolerance;Decreased coordination;Decreased balance;Decreased knowledge of use of DME;Impaired sensation       PT Treatment Interventions Gait training;Therapeutic exercise;Patient/family education;DME instruction;Therapeutic activities;Balance training;Functional mobility training    PT Goals (Current goals can be found in the Care Plan section)  Acute Rehab PT Goals Patient Stated Goal: return home PT Goal Formulation: With patient/family Time For Goal Achievement: 04/28/17 Potential to Achieve Goals: Fair    Frequency Min 3X/week   Barriers to discharge Decreased caregiver support spouse unable to assist and family cannot provide 24hr care, plan return to SNF until pt more mobile    Co-evaluation               AM-PAC PT "6 Clicks" Daily Activity  Outcome Measure Difficulty turning over in bed (including adjusting bedclothes, sheets and blankets)?: A Little Difficulty moving from lying on back to sitting on the side of the bed? : A Lot Difficulty sitting down on and standing up from a chair with arms (e.g., wheelchair, bedside commode, etc,.)?: Total Help needed moving to and from a bed to chair (including a wheelchair)?: A Lot Help needed walking in hospital room?: A Lot Help needed climbing 3-5 steps with a railing? : A Lot 6 Click Score: 12    End of Session Equipment Utilized During Treatment: Gait belt Activity Tolerance: Treatment limited secondary to medical complications (Comment) Patient left: in chair;with call bell/phone within [name];with family/visitor present Nurse Communication: Mobility status PT Visit Diagnosis: Unsteadiness on feet (R26.81);Difficulty in walking, not elsewhere classified (R26.2);Muscle weakness  (generalized) (M62.81)    Time: 4193-7902 PT Time Calculation (min) (ACUTE ONLY): 20 min   Charges:   PT Evaluation $PT Eval Moderate Complexity: 1 Procedure     PT G Codes:        [name], PT [phone]   [place] B [name] 04/14/2017, 9:44 AM

## 2017-04-14 NOTE — Progress Notes (Addendum)
  Progress Note    04/14/2017 7:25 AM 2 Days Post-Op  Subjective:  Feels better; soreness better  Afebrile HR  70's-110's 356'Y-616'O systolic 37% RA  Vitals:   04/13/17 1950 04/14/17 0500  BP: (!) 104/59 140/88  Pulse: (!) 119 79  Resp: 18 18  Temp: 97.8 F (36.6 C) 97.5 F (36.4 C)    Physical Exam: Lungs:  Non labored Incisions:  Clean and dry with extensive ecchymosis Extremities:  Bilateral feet are warm; easily popliteal pulse on the right   CBC    Component Value Date/Time   WBC 20.1 (H) 04/13/2017 0022   RBC 3.91 04/13/2017 0022   HGB 11.7 (L) 04/13/2017 0022   HCT 35.2 (L) 04/13/2017 0022   PLT 183 04/13/2017 0022   MCV 90.0 04/13/2017 0022   MCH 29.9 04/13/2017 0022   MCHC 33.2 04/13/2017 0022   RDW 16.7 (H) 04/13/2017 0022   LYMPHSABS 1.9 04/12/2017 1452   MONOABS 0.9 04/12/2017 1452   EOSABS 0.3 04/12/2017 1452   BASOSABS 0.0 04/12/2017 1452    BMET    Component Value Date/Time   NA 135 04/13/2017 0022   K 4.5 04/13/2017 0022   CL 105 04/13/2017 0022   CO2 22 04/13/2017 0022   GLUCOSE 354 (H) 04/13/2017 0022   BUN 24 (H) 04/13/2017 0022   CREATININE 1.25 (H) 04/13/2017 0022   CALCIUM 8.5 (L) 04/13/2017 0022   GFRNONAA 38 (L) 04/13/2017 0022   GFRAA 44 (L) 04/13/2017 0022    INR    Component Value Date/Time   INR 1.97 04/14/2017 0244     Intake/Output Summary (Last 24 hours) at 04/14/17 0725 Last data filed at 04/14/17 0509  Gross per 24 hour  Intake              475 ml  Output             1040 ml  Net             -565 ml     Assessment:  83 y.o. female is s/p:  Evacuation of hematoma. Control bleeding from right femoral artery  2 Days Post-Op  Plan: -pt doing well this morning with a palpable right popliteal pulse.  Bilateral feet are warm. -has not been out of bed since surgery per pt and family member-mobilize at least to chair.  PT eval and treat also. -not eating much.  Will order Boost (strawberry) tid-encourage po  intake -DVT prophylaxis:  Lovenox.  INR trending upward despite no coumadin -JP drain with 100cc out overnight-may keep one more day. -dc foley per primary team   Leontine Locket, PA-C [facility] [phone] 04/14/2017 7:25 AM  I have examined the patient, reviewed and agree with above. Comfortable. Palpable popliteal pulse. Slow to mobilize. Discussed with patient and family present  Curt Jews, MD 04/14/2017 3:23 PM

## 2017-04-15 DIAGNOSIS — R05 Cough: Secondary | ICD-10-CM

## 2017-04-15 LAB — COMPREHENSIVE METABOLIC PANEL
ALK PHOS: 46 U/L (ref 38–126)
ALT: 22 U/L (ref 14–54)
ANION GAP: 6 (ref 5–15)
AST: 23 U/L (ref 15–41)
Albumin: 2.4 g/dL — ABNORMAL LOW (ref 3.5–5.0)
BILIRUBIN TOTAL: 1.1 mg/dL (ref 0.3–1.2)
BUN: 20 mg/dL (ref 6–20)
CALCIUM: 8.2 mg/dL — AB (ref 8.9–10.3)
CO2: 24 mmol/L (ref 22–32)
Chloride: 107 mmol/L (ref 101–111)
Creatinine, Ser: 0.83 mg/dL (ref 0.44–1.00)
GLUCOSE: 153 mg/dL — AB (ref 65–99)
Potassium: 3.8 mmol/L (ref 3.5–5.1)
Sodium: 137 mmol/L (ref 135–145)
TOTAL PROTEIN: 4.4 g/dL — AB (ref 6.5–8.1)

## 2017-04-15 LAB — CBC
HEMATOCRIT: 31.2 % — AB (ref 36.0–46.0)
Hemoglobin: 10 g/dL — ABNORMAL LOW (ref 12.0–15.0)
MCH: 30.1 pg (ref 26.0–34.0)
MCHC: 32.1 g/dL (ref 30.0–36.0)
MCV: 94 fL (ref 78.0–100.0)
Platelets: 207 10*3/uL (ref 150–400)
RBC: 3.32 MIL/uL — ABNORMAL LOW (ref 3.87–5.11)
RDW: 16.6 % — AB (ref 11.5–15.5)
WBC: 11.7 10*3/uL — ABNORMAL HIGH (ref 4.0–10.5)

## 2017-04-15 LAB — GLUCOSE, CAPILLARY
GLUCOSE-CAPILLARY: 185 mg/dL — AB (ref 65–99)
Glucose-Capillary: 128 mg/dL — ABNORMAL HIGH (ref 65–99)
Glucose-Capillary: 164 mg/dL — ABNORMAL HIGH (ref 65–99)
Glucose-Capillary: 261 mg/dL — ABNORMAL HIGH (ref 65–99)

## 2017-04-15 LAB — PROTIME-INR
INR: 1.44
Prothrombin Time: 17.7 seconds — ABNORMAL HIGH (ref 11.4–15.2)

## 2017-04-15 LAB — HEMOGLOBIN A1C
Hgb A1c MFr Bld: 6.4 % — ABNORMAL HIGH (ref 4.8–5.6)
MEAN PLASMA GLUCOSE: 137 mg/dL

## 2017-04-15 MED ORDER — PHENOL 1.4 % MT LIQD: 1.0000 | OROMUCOSAL | Status: DC | PRN

## 2017-04-15 NOTE — Progress Notes (Addendum)
  Progress Note    04/15/2017 7:13 AM 3 Days Post-Op  Subjective:  Feeling better; was up in the chair yesterday.  Says the feeling is coming back in her foot.   Drinking her boost.  Afebrile HR 80's-90's Afib 637'C-588'F systolic 02% RA  Vitals:   04/14/17 2032 04/15/17 0400  BP: 133/65 (!) 145/74  Pulse: 91 89  Resp: 18 18  Temp: 98.2 F (36.8 C) 98 F (36.7 C)    Physical Exam: Cardiac:  irregular Lungs:  Non labored Incisions:  Clean and dry Extremities:  + popliteal pulse right; right foot is warm; ecchymosis   CBC    Component Value Date/Time   WBC 15.5 (H) 04/14/2017 0946   RBC 3.25 (L) 04/14/2017 0946   HGB 9.8 (L) 04/14/2017 0946   HCT 30.1 (L) 04/14/2017 0946   PLT 180 04/14/2017 0946   MCV 92.6 04/14/2017 0946   MCH 30.2 04/14/2017 0946   MCHC 32.6 04/14/2017 0946   RDW 17.0 (H) 04/14/2017 0946   LYMPHSABS 1.9 04/12/2017 1452   MONOABS 0.9 04/12/2017 1452   EOSABS 0.3 04/12/2017 1452   BASOSABS 0.0 04/12/2017 1452    BMET    Component Value Date/Time   NA 137 04/15/2017 0157   K 3.8 04/15/2017 0157   CL 107 04/15/2017 0157   CO2 24 04/15/2017 0157   GLUCOSE 153 (H) 04/15/2017 0157   BUN 20 04/15/2017 0157   CREATININE 0.83 04/15/2017 0157   CALCIUM 8.2 (L) 04/15/2017 0157   GFRNONAA >60 04/15/2017 0157   GFRAA >60 04/15/2017 0157    INR    Component Value Date/Time   INR 1.44 04/15/2017 0157     Intake/Output Summary (Last 24 hours) at 04/15/17 0713 Last data filed at 04/15/17 0700  Gross per 24 hour  Intake              360 ml  Output               80 ml  Net              280 ml     Assessment:  83 y.o. female is s/p:  Evacuation of hematoma. Control bleeding from right femoral artery  3 Days Post-Op  Plan: -pt feeling better with palpable popliteal pulse -she continues to have about 100cc out per shift.  Will leave JP drain at least one more day.  -continue to mobilize out of bed -continue to encourage po intake to  promote wound healing.    Leontine Locket, PA-C [facility] [phone] 04/15/2017 7:13 AM  I have examined the patient, reviewed and agree with above. Patient sitting up in chair. Very weak overall. Should be okay to resume anticoagulation. Patient had huge hematoma with very large space left behind after evacuation. Hopefully can get drain out tomorrow. Mobilize.  Curt Jews, MD 04/15/2017 8:54 AM

## 2017-04-15 NOTE — Clinical Social Work Note (Signed)
Clinical Social Work Assessment  Patient Details  Name: [name] MRN: [id number] Date of Birth: 04-23-1928  Date of referral:  04/15/17               Reason for consult:  Discharge Planning                Permission sought to share information with:  Family Supports Permission granted to share information::  Yes, Verbal Permission Granted  Name::     Detria Cummings  Agency::     Relationship::  daughter  Contact Information:  [phone]  Housing/Transportation Living arrangements for the past 2 months:  Single Family Home Source of Information:  Patient Patient Interpreter Needed:  None Criminal Activity/Legal Involvement Pertinent to Current Situation/Hospitalization:  No - Comment as needed Significant Relationships:  Spouse, Adult Children Lives with:  Self Do you feel safe going back to the place where you live?  Yes Need for family participation in patient care:  No (Coment)  Care giving concerns:  Patient lives with spouse and has support from adult children    Social Worker assessment / plan:  Holiday representative met patient and daughter-in-law at bedside to offer support and discuss discharge plans. Family is agreeable for patient to go to SNF but would like PT to see her one more time before discharge. Patient is agreeable to discharge to SNF to assist her. CSW to complete necessary paperwork and initiate SNF search on patients behalf.CSW to follow up with patient once bed offers are available.   Employment status:  Retired Forensic scientist:  Medicare PT Recommendations:  [place] / Referral to community resources:  [place]  Patient/Family's Response to care: Family verbalized appreciation and understanding for CSW role and involvement in care.  Patient/Family's Understanding of and Emotional Response to Diagnosis, Current Treatment, and Prognosis:  Family with good understanding of current medical state and  limitations around most recent hospitalization.  Emotional Assessment Appearance:  Appears stated age Attitude/Demeanor/Rapport:  Other Affect (typically observed):  Pleasant Orientation:  Oriented to Situation, Oriented to  Time, Oriented to Place, Oriented to Self Alcohol / Substance use:  Not Applicable Psych involvement (Current and /or in the community):  No (Comment)  Discharge Needs  Concerns to be addressed:  No discharge needs identified Readmission within the last 30 days:  Yes Current discharge risk:  None Barriers to Discharge:  No Barriers Identified   Wende Neighbors, LCSW 04/15/2017, 4:51 PM

## 2017-04-15 NOTE — Care Management Note (Addendum)
Case Management Note  Patient Details  Name: [name] MRN: [id number] Date of Birth: 1928-03-17  Subjective/Objective:   POD 3 evacuation of hematoma, per pt eval rec SNF, CSW referral.  She has Medicare and [employer].   PCP listed as Leanna Battles                  Action/Plan: NCM will follow along with CSW for dc needs.  Expected Discharge Date:                  Expected Discharge Plan:  Skilled Nursing Facility  In-House Referral:  Clinical Social Work  Discharge planning Services  CM Consult  Post Acute Care Choice:    Choice offered to:     DME Arranged:    DME Agency:     HH Arranged:    [place] Agency:     Status of Service:  Completed, signed off  If discussed at [employer], dates discussed:    Additional Comments:  Zenon Mayo, RN 04/15/2017, 12:49 PM

## 2017-04-15 NOTE — Progress Notes (Signed)
Referring Physician(s): Dr Derrek Gu  Supervising Physician: Arne Cleveland  Patient Status:  [facility] - In-pt  Chief Complaint:  Right femoral hematoma Evacuation in OR with Dr Donnetta Hutching 5/26   Subjective:  Admitted overnight 5/22-23 for left lower extremity thrombolysis- performe In IR with Dr Earleen Newport   IMPRESSION: 1. Interval complete clearance of acute left lower extremity arterial thrombus after overnight catheter directed thrombolytic infusion. 2. Restoration of inline flow through the posterior tibial artery to the ankle. 3. Tandem stenoses in the mid SFA and tibioperoneal trunk. 4. Collateral reconstitution of peroneal and anterior tibial distal runoff as above.  Returned 5/26 to ED with Rt femoral hematoma Evacuation in OR  Doing well Drain in place For removal prob in am per note Pt is up in bed Better today    Allergies: Amprenavir; Ceftriaxone sodium; Phenergan [promethazine hcl]; Sulfonamide derivatives; and Vancomycin hcl  Medications: Prior to Admission medications   Medication Sig Start Date End Date Taking? Authorizing Provider  acetaminophen (TYLENOL) 325 MG tablet Take 650 mg by mouth daily.    Yes [provider]  aspirin EC 325 MG tablet Take 325 mg by mouth at bedtime.   Yes [provider]  calcium carbonate (OSCAL) 1500 (600 Ca) MG TABS tablet Take 1 tablet by mouth daily with breakfast.    Yes [provider]  clopidogrel (PLAVIX) 75 MG tablet Take 1 tablet (75 mg total) by mouth daily. 04/11/17  Yes Saverio Danker, PA-C  enoxaparin (LOVENOX) 60 MG/0.6ML injection Inject 0.5 mLs (50 mg total) into the skin every 12 (twelve) hours. 04/10/17 04/15/17 Yes Saverio Danker, PA-C  ferrous sulfate 325 (65 FE) MG tablet Take 325 mg by mouth daily with breakfast.   Yes [provider]  magnesium hydroxide (MILK OF MAGNESIA) 400 MG/5ML suspension Take 45 mLs by mouth See admin instructions. AS NEEDED FOR 2 DAYS TO STOP ON  04/13/17   Yes [provider]  metoprolol succinate (TOPROL-XL) 25 MG 24 hr tablet Take 25 mg by mouth at bedtime.    Yes [provider]  vitamin B-12 (CYANOCOBALAMIN) 1000 MCG tablet Take 1,000 mcg by mouth daily.   Yes [provider]  warfarin (COUMADIN) 3 MG tablet Take 3 mg by mouth daily at 6 PM. FOR A-FIB   Yes [provider]  Vitamin D, Ergocalciferol, (DRISDOL) 50000 units CAPS capsule Take 50,000 Units by mouth every 7 (seven) days. Sundays    [provider]     Vital Signs: BP (!) 145/70   Pulse 90   Temp 98 F (36.7 C) (Oral)   Resp 18   Ht 5\' 2"  (1.575 m)   Wt 112 lb 7 oz (51 kg)   SpO2 99%   BMI 20.56 kg/m   Physical Exam  Pulmonary/Chest: Effort normal.  Abdominal: Soft.  Musculoskeletal: Normal range of motion.  Neurological: She is alert.  Skin: Skin is warm and dry.  Site of hematoma is bandaged Clean and dry NT drain intact  Psychiatric: She has a normal mood and affect.  Nursing note and vitals reviewed.  Left foot warm; good color  Imaging: Ct Abdomen Pelvis Wo Contrast  Result Date: 04/12/2017 CLINICAL DATA:  Right inguinal swelling. EXAM: CT ABDOMEN AND PELVIS WITHOUT CONTRAST TECHNIQUE: Multidetector CT imaging of the abdomen and pelvis was performed following the standard protocol without IV contrast. COMPARISON:  None. FINDINGS: Lower chest: Lung bases are clear. Aortic atherosclerosis noted. Calcifications in the RCA and LAD coronary artery noted.  Hepatobiliary: No focal liver abnormality. Multiple stones identified within the gallbladder. These measure up to 1.2 cm. No gallbladder wall thickening or biliary dilatation. Pancreas: Unremarkable. No pancreatic ductal dilatation or surrounding inflammatory changes. Spleen: Geographic area of low attenuation involving the inferior spleen is identified, new from 03/04/2017. Adrenals/Urinary Tract: The adrenal glands appear normal. Unremarkable appearance of the  kidneys. The urinary bladder is within normal limits. Stomach/Bowel: Stomach is within normal limits. Appendix appears normal. No evidence of bowel wall thickening, distention, or inflammatory changes. Vascular/Lymphatic: Aortic atherosclerosis. No aneurysm. No upper abdominal or pelvic adenopathy. Reproductive: The uterus is unremarkable. Left adnexal cyst is again noted measuring 5.3 cm and 7 HU. Other: There is a large well-circumscribed mass within the left inguinal region which extends laterally into the soft tissues overlying the lateral pelvis. The ventral component measures 3.9 x 8.4 by 9.2 cm. The more hyperdense lateral component Measures 8.7 x 4.1 by 7.5 cm. There is diffuse stranding within the surrounding soft tissues. Musculoskeletal: Previous left hip arthroplasty. Suggestion of right hip avascular necrosis, image 81 of series 8. There is degenerative disc disease noted throughout the lumbar spine. Compression fractures are noted at T11 and L5. IMPRESSION: 1. Examination is positive for large complex mass involving the ventral aspect of the right inguinal region and extending laterally to overly the right iliac bone. This is favored to represent a large acute and subacute hematoma. 2. 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 3.  Aortic Atherosclerosis (ICD10-I70.0). 4. Thoracic and lumbar compression deformities. 5. Gallstones These results were called by telephone at the time of interpretation on 04/12/2017 at 4:02 pm to Dr. Damita Dunnings , who verbally acknowledged these results. Electronically Signed   By: Kerby Moors M.D.   On: 04/12/2017 16:03    Labs:  CBC:  Recent Labs  04/09/17 0552 04/12/17 1452 04/12/17 1628 04/13/17 0022 04/14/17 0946  WBC 13.8* 14.1*  --  20.1* 15.5*  HGB 13.1 10.6* 10.5* 11.7* 9.8*  HCT 39.0 33.1* 31.0* 35.2* 30.1*  PLT 235 239  --  183 180    COAGS:  Recent Labs  03/25/17 0645  04/10/17 0313 04/12/17 1452 04/14/17 0244 04/15/17 0157  INR 1.16  < > 1.27 1.66  1.97 1.44  APTT 32  --   --   --   --   --   < > = values in this interval not displayed.  BMP:  Recent Labs  04/07/17 0850 04/12/17 1452 04/12/17 1628 04/13/17 0022 04/15/17 0157  NA 138 140 142 135 137  K 3.6 3.7 4.8 4.5 3.8  CL 101 106  --  105 107  CO2 25 22  --  22 24  GLUCOSE 314* 309* 342* 354* 153*  BUN 16 16  --  24* 20  CALCIUM 9.0 9.1  --  8.5* 8.2*  CREATININE 0.83 1.05*  --  1.25* 0.83  GFRNONAA >60 47*  --  38* >60  GFRAA >60 54*  --  44* >60    LIVER FUNCTION TESTS:  Recent Labs  03/18/17 1138 04/15/17 0157  BILITOT 1.5* 1.1  AST 23 23  ALT 24 22  ALKPHOS 77 46  PROT 6.8 4.4*  ALBUMIN 3.5 2.4*    Assessment and Plan:  Left lower extremity thrombolysis in IR 5/22-23 Discharged in good condition Returned with right femoral hematoma Requiring evacuation in OR 5/26 Drain in rt groin now---followed by Vascular team. Plan for removal and dc home tomorrow   Electronically Signed: Keyoni Lapinski A, PA-C 04/15/2017, 11:33 AM  I spent a total of 15 Minutes at the the patient's bedside AND on the patient's hospital floor or unit, greater than 50% of which was counseling/coordinating care for rt femoral hematoma

## 2017-04-15 NOTE — NC FL2 (Signed)
[place] LEVEL OF CARE SCREENING TOOL     IDENTIFICATION  Patient Name: [name] Birthdate: 06-03-1928 Sex: female Admission Date (Current Location): 04/12/2017  [facility] and [place] Number:  Herbalist and Address:  [facility] [place] [address], [place], [place] [address]      Provider Number: [id number]  Attending Physician Name and Address:  Reyne Dumas, MD  Relative Name and Phone Number:  Leniyah Martell, [phone]    Current Level of Care: Hospital Recommended Level of Care: [place] Prior Approval Number:    Date Approved/Denied:   PASRR Number: [id number]  Discharge Plan: SNF    Current Diagnoses: Patient Active Problem List   Diagnosis Date Noted  . Hematoma   . Groin swelling 04/12/2017  . Hematoma of groin 04/12/2017  . Critical lower limb ischemia 04/08/2017  . Long term (current) use of anticoagulants [Z79.01] 12/13/2015  . PAD (peripheral artery disease) ([place]) 10/05/2015  . ULCER OF OTHER PART OF FOOT 06/06/2010  . ESSENTIAL HYPERTENSION 03/26/2010  . ATRIAL FIBRILLATION 03/26/2010  . CUTANEOUS ERUPTIONS, DRUG-INDUCED 03/26/2010  . DIARRHEA 03/26/2010  . GERD 03/20/2010  . UNSPEC LOCAL INFECTION SKIN&SUBCUTANEOUS TISSUE 03/20/2010  . OSTEOARTHRITIS 03/20/2010  . ARTHRITIS, SEPTIC 03/14/2010  . OTH COMPLICATIONS DUE INTERNAL JOINT PROSTHESIS 02/22/2010    Orientation RESPIRATION BLADDER Height & Weight     Self, Time, Situation, Place  Normal Continent Weight: 112 lb 7 oz (51 kg) Height:  5\' 2"  (157.5 cm)  BEHAVIORAL SYMPTOMS/MOOD NEUROLOGICAL BOWEL NUTRITION STATUS      Continent Diet (regular)  AMBULATORY STATUS COMMUNICATION OF NEEDS Skin     Verbally Normal                       Personal Care Assistance Level of Assistance  Bathing, Feeding, Dressing Bathing Assistance: Maximum assistance Feeding assistance: Independent Dressing Assistance: Maximum assistance    Functional Limitations Info  Sight, Hearing, Speech Sight Info: Adequate Hearing Info: Adequate Speech Info: Adequate    SPECIAL CARE FACTORS FREQUENCY  PT (By licensed PT), OT (By licensed OT)     PT Frequency: 5x wk OT Frequency: 5x wk            Contractures Contractures Info: Not present    Additional Factors Info  Code Status Code Status Info: full code Allergies Info: AMPRENAVIR, CEFTRIAXONE SODIUM, PHENERGAN PROMETHAZINE HCL, SULFONAMIDE DERIVATIVES, VANCOMYCIN HCL            Current Medications (04/15/2017):  This is the current hospital active medication list Current Facility-Administered Medications  Medication Dose Route Frequency Provider Last Rate Last Dose  . acetaminophen (TYLENOL) tablet 325-650 mg  325-650 mg Oral Q4H PRN Early, Arvilla Meres, MD      . alum & mag hydroxide-simeth (MAALOX/MYLANTA) 200-200-20 MG/5ML suspension 15-30 mL  15-30 mL Oral Q2H PRN Early, Arvilla Meres, MD      . aspirin EC tablet 325 mg  325 mg Oral QHS Rosetta Posner, MD   325 mg at 04/14/17 2121  . calcium carbonate (OS-CAL - dosed in mg of elemental calcium) tablet 1,250 mg  1,250 mg Oral Q breakfast Reyne Dumas, MD   1,250 mg at 04/15/17 1009  . clopidogrel (PLAVIX) tablet 75 mg  75 mg Oral Daily Early, Arvilla Meres, MD   75 mg at 04/15/17 1009  . docusate sodium (COLACE) capsule 100 mg  100 mg Oral Daily Early, Arvilla Meres, MD  100 mg at 04/15/17 1009  . feeding supplement (BOOST / RESOURCE BREEZE) liquid 1 Container  237 mL Oral TID WC Rhyne, Samantha J, PA-C   1 Container at 04/15/17 [phone]  . ferrous sulfate tablet 325 mg  325 mg Oral Q breakfast Early, Arvilla Meres, MD   325 mg at 04/15/17 1009  . guaiFENesin-dextromethorphan (ROBITUSSIN DM) 100-10 MG/5ML syrup 15 mL  15 mL Oral Q4H PRN Early, Arvilla Meres, MD      . hydrALAZINE (APRESOLINE) injection 5 mg  5 mg Intravenous Q20 Min PRN Early, Arvilla Meres, MD      . insulin aspart (novoLOG) injection 0-5 Units  0-5 Units Subcutaneous QHS Sood, Vineet, MD      .  insulin aspart (novoLOG) injection 0-9 Units  0-9 Units Subcutaneous TID WC Chesley Mires, MD   1 Units at 04/15/17 0654  . labetalol (NORMODYNE,TRANDATE) injection 10 mg  10 mg Intravenous Q10 min PRN Early, Arvilla Meres, MD      . lactated ringers infusion   Intravenous Continuous Chesley Mires, MD 75 mL/hr at 04/15/17 0035    . MEDLINE mouth rinse  15 mL Mouth Rinse BID Chesley Mires, MD   15 mL at 04/14/17 1000  . metoprolol tartrate (LOPRESSOR) injection 2-5 mg  2-5 mg Intravenous Q2H PRN Early, Arvilla Meres, MD      . metoprolol tartrate (LOPRESSOR) tablet 12.5 mg  12.5 mg Oral BID Reyne Dumas, MD   12.5 mg at 04/15/17 1009  . morphine 4 MG/ML injection 2-5 mg  2-5 mg Intravenous Q1H PRN Chesley Mires, MD      . ondansetron (ZOFRAN) injection 4 mg  4 mg Intravenous Q6H PRN Early, Arvilla Meres, MD      . oxyCODONE-acetaminophen (PERCOCET/ROXICET) 5-325 MG per tablet 1-2 tablet  1-2 tablet Oral Q4H PRN Early, Arvilla Meres, MD   1 tablet at 04/14/17 0449  . pantoprazole (PROTONIX) EC tablet 40 mg  40 mg Oral Daily Early, Arvilla Meres, MD   40 mg at 04/15/17 1008  . phenol (CHLORASEPTIC) mouth spray 1 spray  1 spray Mouth/Throat PRN Early, Arvilla Meres, MD      . potassium chloride SA (K-DUR,KLOR-CON) CR tablet 20-40 mEq  20-40 mEq Oral Once PRN Early, Arvilla Meres, MD      . vitamin B-12 (CYANOCOBALAMIN) tablet 1,000 mcg  1,000 mcg Oral Daily Early, Arvilla Meres, MD   1,000 mcg at 04/15/17 1009  . Vitamin D (Ergocalciferol) (DRISDOL) capsule 50,000 Units  50,000 Units Oral Q Valaria Good, MD   50,000 Units at 04/13/17 1038     Discharge Medications: Please see discharge summary for a list of discharge medications.  Relevant Imaging Results:  Relevant Lab Results:   Additional Information SS# [id number]  Wende Neighbors, LCSW

## 2017-04-15 NOTE — Progress Notes (Signed)
[facility] PROGRESS NOTE  TOBEY LIPPARD QQV:[id number] DOB: [birth year] DOA: 04/12/2017   PCP: Leanna Battles, MD     Assessment/Plan: Active Problems:   Groin swelling   Hematoma of groin   Hematoma   83 y/o woman with a history of reflux, hypertension who was admitted to the ICU after evacuation of a hematoma arising from the femoral artery.  She underwent catheter directed thrombolysis of the left LE on 5/22 and 5/23.  She then presented to the [facility] ED from her nursing home on 5/26 with a large inguinal hematoma.  She underwent evacuation of the hematoma and repair of the femoral artery.  PCCM was asked to admit her after her procedure. [facility] assumed care 5/28  Assessment and plan Evacuation of hematoma. Control bleeding from right femoral artery  Plan per vascular surgery/IR Continue JP drain-Drain in rt groin now---followed by Vascular team. Plan for removal and dc home tomorrow Follow-up CBC  History of atrial fibrillation, heart rate uncontrolled Resumed  metoprolol, Coumadin on hold Patient on aspirin and Plavix already  Hypertension Blood pressure stable, continue metoprolol  Left lower extremity ischemia-resolved underwent LLE arteriogram with restoration of LLE outflow   on 5/22 and completed on 5/23 with restoration of pulses   Acute blood loss anemia Hemoglobin drop from 14.8 >9.8 Coumadin on hold, resume per vascular surgery recommendations    DVT prophylaxsis SCDs  Code Status:  Full code    Family Communication: Discussed in detail with the patient, all imaging results, lab results explained to the patient   Disposition Plan:  Anticipate discharge tomorrow after drain removal      Consultants:  Vascular surgery  Procedures:   Evacuation of hematoma  Antibiotics: Anti-infectives    Start     Dose/Rate Route Frequency Ordered Stop   04/13/17 0200  cefUROXime (ZINACEF) 1.5 g in dextrose 5 % 50 mL IVPB     1.5 g 100 mL/hr over 30  Minutes Intravenous Every 12 hours 04/12/17 1831 04/13/17 1356         HPI/Subjective:  Denies any chest pain or shortness of breath  Objective: Vitals:   04/14/17 1455 04/14/17 2032 04/15/17 0400 04/15/17 1009  BP: (!) 116/52 133/65 (!) 145/74 (!) 145/70  Pulse: 78 91 89 90  Resp: 18 18 18    Temp:  98.2 F (36.8 C) 98 F (36.7 C)   TempSrc:  Oral Oral   SpO2: 100% 99% 99%   Weight:      Height:        Intake/Output Summary (Last 24 hours) at 04/15/17 1344 Last data filed at 04/15/17 0800  Gross per 24 hour  Intake              600 ml  Output               80 ml  Net              520 ml    Exam:  Examination:  General exam: Appears calm and comfortable  Respiratory system: Clear to auscultation. Respiratory effort normal. Cardiovascular system: S1 & S2 heard, irre irre. No JVD, murmurs, rubs, gallops or clicks.  Gastrointestinal system: Abdomen is nondistended, soft and nontender. No organomegaly or masses felt. Normal bowel sounds heard. Central nervous system: Alert and oriented. No focal neurological deficits. Extremities:  Incisions:  Clean and dry with extensive ecchymosis   Bilateral feet are warm; easily popliteal pulse on the right Skin: No  rashes, lesions or ulcers Psychiatry: Judgement and insight appear normal. Mood & affect appropriate.     Data Reviewed: I have personally reviewed following labs and imaging studies  Micro Results Recent Results (from the past 240 hour(s))  MRSA PCR Screening     Status: None   Collection Time: 04/08/17 10:44 AM  Result Value Ref Range Status   MRSA by PCR NEGATIVE NEGATIVE Final    Comment:        The GeneXpert MRSA Assay (FDA approved for NASAL specimens only), is one component of a comprehensive MRSA colonization surveillance program. It is not intended to diagnose MRSA infection nor to guide or monitor treatment for MRSA infections.   MRSA PCR Screening     Status: None   Collection Time: 04/12/17   6:32 PM  Result Value Ref Range Status   MRSA by PCR NEGATIVE NEGATIVE Final    Comment:        The GeneXpert MRSA Assay (FDA approved for NASAL specimens only), is one component of a comprehensive MRSA colonization surveillance program. It is not intended to diagnose MRSA infection nor to guide or monitor treatment for MRSA infections.     Radiology Reports Ct Abdomen Pelvis Wo Contrast  Result Date: 04/12/2017 CLINICAL DATA:  Right inguinal swelling. EXAM: CT ABDOMEN AND PELVIS WITHOUT CONTRAST TECHNIQUE: Multidetector CT imaging of the abdomen and pelvis was performed following the standard protocol without IV contrast. COMPARISON:  None. FINDINGS: Lower chest: Lung bases are clear. Aortic atherosclerosis noted. Calcifications in the RCA and LAD coronary artery noted. Hepatobiliary: No focal liver abnormality. Multiple stones identified within the gallbladder. These measure up to 1.2 cm. No gallbladder wall thickening or biliary dilatation. Pancreas: Unremarkable. No pancreatic ductal dilatation or surrounding inflammatory changes. Spleen: Geographic area of low attenuation involving the inferior spleen is identified, new from 03/04/2017. Adrenals/Urinary Tract: The adrenal glands appear normal. Unremarkable appearance of the kidneys. The urinary bladder is within normal limits. Stomach/Bowel: Stomach is within normal limits. Appendix appears normal. No evidence of bowel wall thickening, distention, or inflammatory changes. Vascular/Lymphatic: Aortic atherosclerosis. No aneurysm. No upper abdominal or pelvic adenopathy. Reproductive: The uterus is unremarkable. Left adnexal cyst is again noted measuring 5.3 cm and 7 HU. Other: There is a large well-circumscribed mass within the left inguinal region which extends laterally into the soft tissues overlying the lateral pelvis. The ventral component measures 3.9 x 8.4 by 9.2 cm. The more hyperdense lateral component Measures 8.7 x 4.1 by 7.5 cm.  There is diffuse stranding within the surrounding soft tissues. Musculoskeletal: Previous left hip arthroplasty. Suggestion of right hip avascular necrosis, image 81 of series 8. There is degenerative disc disease noted throughout the lumbar spine. Compression fractures are noted at T11 and L5. IMPRESSION: 1. Examination is positive for large complex mass involving the ventral aspect of the right inguinal region and extending laterally to overly the right iliac bone. This is favored to represent a large acute and subacute hematoma. 2. 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 3.  Aortic Atherosclerosis (ICD10-I70.0). 4. Thoracic and lumbar compression deformities. 5. Gallstones These results were called by telephone at the time of interpretation on 04/12/2017 at 4:02 pm to Dr. Damita Dunnings , who verbally acknowledged these results. Electronically Signed   By: Kerby Moors M.D.   On: 04/12/2017 16:03   Ir Angiogram Extremity Left  Result Date: 04/08/2017 INDICATION: 83 year old female with acute thrombosis of left superficial femoral artery stent system. SFA and popliteal stent system placed 03/25/2017 for treatment of  critical limb ischemia and nonhealing wound. She returns today for initiation of thrombolysis and treatment EXAM: ULTRASOUND GUIDED ACCESS RIGHT COMMON FEMORAL ARTERY ANGIOGRAM LEFT LOWER EXTREMITY INITIATION OF ARTERIAL THROMBOLYSIS WITH CATHETER DIRECTED THERAPY MEDICATIONS: None ANESTHESIA/SEDATION: Moderate (conscious) sedation was employed during this procedure. A total of Versed 2.0 mg and Fentanyl 100 mcg was administered intravenously. Moderate Sedation Time: 45 minutes. The patient's level of consciousness and vital signs were monitored continuously by radiology nursing throughout the procedure under my direct supervision. CONTRAST:  40 cc Isovue FLUOROSCOPY TIME:  Fluoroscopy Time: 10 minutes 24 seconds (67 mGy). COMPLICATIONS: None PROCEDURE: Informed consent was obtained from the patient following  explanation of the procedure, risks, benefits and alternatives. The patient understands, agrees and consents for the procedure. All questions were addressed. A time out was performed prior to the initiation of the procedure. Maximal barrier sterile technique utilized including caps, mask, sterile gowns, sterile gloves, large sterile drape, hand hygiene, and Betadine prep. Ultrasound survey of the right inguinal region was performed with images stored and sent to PACs. A micropuncture needle was used access the right common femoral artery under ultrasound. With excellent arterial blood flow returned, and an .018 micro wire was passed through the needle, observed enter the abdominal aorta under fluoroscopy. The needle was removed, and a micropuncture sheath was placed over the wire. The inner dilator and wire were removed, and an 035 Bentson wire was advanced under fluoroscopy into the abdominal aorta. The sheath was removed and a standard 5 [place] vascular sheath was placed. The dilator was removed and the sheath was flushed. Omni Flush catheter was advanced over the Bentson wire into the abdominal aorta and the Bentson wire was retracted to form the curve on the catheter. Catheter and wire were then directed down the contralateral common iliac artery. The wire was advanced to the superficial femoral artery. Catheter was removed an a 5 French vertebral catheter was placed. Angiogram of the left lower extremity was then performed. Rosen wire was then passed through the catheter into the superficial femoral artery in the catheter was removed. Sheath was removed. A 6 French 45 cm Arrow braided sheath was then placed over the wire into the distal external iliac artery. Vertebral catheter was then placed over the Rosen wire in the [place] wire was removed. Angiogram was performed. Combination of Rosen wire and the vertebral catheter were then used to traverse the proximal 2/3 of the thrombosed superficial femoral artery.  When the catheter reached the popliteal artery there was significant resistance. Vertebral catheter was removed and a Uni fuse infusion catheter, 135 cm length, 40 cm infusion length was selected. This was placed over the [facility] wire. Rosen wire was then was removed and a stiff Glidewire was then used. The tip of the Uni fuse catheter was slowly advanced into the origin of the posterior tibial artery. Glidewire was removed, limited angiogram was performed, and then the obturator wire was placed. Patient tolerated the procedure well. Patient remained hemodynamically stable throughout. No complications were encountered.  No significant blood loss FINDINGS: Angiogram demonstrates occlusion of the stent system of the distal superficial femoral artery an the popliteal artery. There is no significant reconstitution of flow into the distal tibial arteries, however, patency of the distal popliteal artery, tibioperoneal trunk, and the proximal 1/3 of the anterior tibial artery was evident. The proximal posterior tibial artery was occluded. The anterior tibial artery is again occluded proximally, which was the case on the prior angiogram. There is critical stenosis  at the origin of the peroneal artery. After placement of the infusion catheter, injection demonstrates partial filling of the posterior tibial artery. IMPRESSION: Status post ultrasound guided access of right common femoral artery and placement of a 40 cm length infusion catheter across acute thrombosis of the left superficial femoral artery and popliteal artery for initiation of thrombolysis. Signed, Dulcy Fanny. Earleen Newport, DO [facility] Radiology Electronically Signed   By: Corrie Mckusick D.O.   On: 04/08/2017 13:41   Ir Angiogram Extremity Left  Result Date: 03/25/2017 INDICATION: 83 year old female with a history of left lower extremity critical limb ischemia and a nonhealing amputation site. Prior ankle brachial  index measures in the mild range of arterial occlusive disease, with the segmental exam demonstrating evidence of a distal femoral or popliteal occlusion. CT performed 03/04/2017 confirms popliteal occlusion with questionable patency of the tibial vessels. She presents today for an angiogram and attempted treatment of the popliteal occlusion. EXAM: ULTRASOUND GUIDED ACCESS OF RIGHT COMMON FEMORAL ARTERY ANGIOGRAM LEFT LOWER EXTREMITY REVASCULARIZATION OF OCCLUDED POPLITEAL ARTERY WITH ANGIOPLASTY AND STENTING. REVASCULARIZATION OF OCCLUDED TIBIOPERONEAL ARTERY AND POSTERIOR TIBIAL ARTERY WITH BALLOON ANGIOPLASTY AND RESTORATION OF IN-LINE FLOW TO THE ANKLE MEDICATIONS: 200 MCG NITROGLYCERIN INTRA-ARTERIAL 8000 UNITS HEPARIN FOR ANTI COAGULATION 5 MG HYDRALAZINE ANESTHESIA/SEDATION: Moderate (conscious) sedation was employed during this procedure. A total of Versed 2.0 mg and Fentanyl 100 mcg was administered intravenously. Moderate Sedation Time: 180 minutes. The patient's level of consciousness and vital signs were monitored continuously by radiology nursing throughout the procedure under my direct supervision. CONTRAST:  200 cc Isovue-300 FLUOROSCOPY TIME:  Fluoroscopy Time: 38 minutes 12 seconds (104.9 mGy). COMPLICATIONS: None PROCEDURE: Informed consent was obtained from the patient following explanation of the procedure, risks, benefits and alternatives. The patient understands, agrees and consents for the procedure. All questions were addressed. A time out was performed prior to the initiation of the procedure. Maximal barrier sterile technique utilized including caps, mask, sterile gowns, sterile gloves, large sterile drape, hand hygiene, and Betadine prep. Ultrasound survey of the right inguinal region was performed with images stored and sent to PACs. A micropuncture needle was used access the right common femoral artery under ultrasound. With excellent arterial blood flow returned, and an .018 micro wire  was passed through the needle, observed enter the abdominal aorta under fluoroscopy. The needle was removed, and a micropuncture sheath was placed over the wire. The inner dilator and wire were removed, and an 035 Bentson wire was advanced under fluoroscopy into the abdominal aorta. The sheath was removed and a standard 5 [place] vascular sheath was placed. The dilator was removed and the sheath was flushed. Omni Flush catheter was advanced over the Bentson wire to the aortic bifurcation. Pelvic angiogram performed. Omni Flush catheter was then used to navigate the Bentson wire into the left external iliac artery. Omni Flush catheter was removed, and 100 cm vertebral catheter was advanced into the external iliac artery. Angiogram of the left lower extremity was performed. Rosen wire was then placed through the vertebral catheter and the 5 French short sheath was exchanged for a 6 [place] braided 65cm Arrow sheath. Sheath was advanced into the proximal superficial femoral artery. Multiple obliquities at the left knee were required in order to visualize the occlusion of the popliteal artery given the left knee arthroplasty hardware. Once adequate angiogram was performed, the proximal occlusion was engaged with the vertebral catheter and an 035 Coons wire. The occlusion was traversed with the vertebral catheter,  with the vertebral catheter in the below knee popliteal artery. Wire was removed and a luminal position was confirmed with injection. 014 BMW wire was then advanced through the vertebral catheter and the vertebral catheter was removed. Balloon angioplasty was then performed to a diameter of 4 mm along the length of the abnormal distal superficial femoral artery and the popliteal occlusion into the below knee popliteal artery. A Supera 4 mm x 80 mm was deployed in the popliteal artery across the occluded site, with the stent structure extended proximally with a 5 mm x 80 mm stent. Repeat angiogram was performed.  Balloon angioplasty was then performed along the length of the stent site with 5 mm by 60 mm. Combination of angled CXi catheter and 016 Fathom wire were then used to engage the distal tibioperoneal trunk occlusion and the posterior tibial artery occlusion. This catheter and wire combination were successful with traversing the inclusion. Once the wire was removed and a luminal position was confirmed, the BMW wire was advanced through the CXI catheter into the posterior tibial artery. Balloon angioplasty was performed with 3 mm diameter balloon. Angiogram was performed, confirming patency to the ankle. All catheters and wires were removed. Final angiogram was performed. The sheath was then withdrawn to the right external iliac artery and angiogram was performed. The braided sheath was then exchanged for a short 6 [place] sheath. Given that the ACT greater than 150 at the time of conclusion, sheath remain in place on a pressurized saline bag while heparin was metabolites. The patient tolerated the procedure well and remained hemodynamically stable throughout. No complications were encountered and no significant blood loss. FINDINGS: Ultrasound survey of the right common femoral artery demonstrates mild calcifications with patent vessel. Angiogram of the pelvic vasculature demonstrates dense calcium with mild atherosclerotic changes bilaterally. Bilateral L4 and L5 lumbar arteries are patent. Bilateral hypogastric arteries are patent with patent pelvic vasculature. No significant stenosis of the right or left external iliac arteries. Angiogram of the left lower extremity demonstrates mild disease of the proximal left superficial femoral artery with patent profunda femoris. 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 The below knee popliteal artery is patent from collateral flow. Anterior tibial  artery origin is patent, occluded in the proximal third beyond the origin. Distal tibioperoneal trunk is occluded with occlusion extending into the proximal posterior tibial artery and peroneal artery. Collateral flow reconstitutes the distal peroneal artery, posterior tibial artery, and anterior tibial artery which remain patent at the ankle. There is moderate disease of the distal posterior tibial artery involving the common plantar, with the lateral plantar artery remain patent. Status post treatment of popliteal occlusion and placement of overlapping stent system proximally into the disease superficial femoral artery, there is excellent flow through the SFA and popliteal artery with no dissection. Continued flow through the proximal anterior tibial artery. Status post treatment of tibioperoneal occlusion and revascularization of the posterior tibial artery there is in-line flow restored through the posterior tibial artery to the ankle. At the conclusion of the case there is palpable posterior tibial pulse on the left. IMPRESSION: Status post left lower extremity angiogram and revascularization of chronic total occlusion of popliteal artery, distal TP trunk, and origin of the posterior tibial artery with restoration of in-line flow to the ankle via the posterior tibial artery after stenting of the popliteal occlusion. Signed, Dulcy Fanny. Earleen Newport, DO [facility] Radiology PLAN: The right common femoral artery sheath will stay until with the ACT is below 200. Plan for closure with Exoseal. Gentle hydration IV. Right leg straight until the sheath is out. After the sheath is out the right hip will stay straight for 4 hours. Plan on DC home today. Should the patient have plateau of wound healing, the next target would be left anterior tibial artery in the angiosome, with previous discussion of tibial access and anterior tibial artery revascularization. Electronically Signed    By: Corrie Mckusick D.O.   On: 03/25/2017 17:16   Ir Angiogram Selective Each Additional Vessel  Result Date: 03/25/2017 INDICATION: 83 year old female with a history of left lower extremity critical limb ischemia and a nonhealing amputation site. Prior ankle brachial index measures in the mild range of arterial occlusive disease, with the segmental exam demonstrating evidence of a distal femoral or popliteal occlusion. CT performed 03/04/2017 confirms popliteal occlusion with questionable patency of the tibial vessels. She presents today for an angiogram and attempted treatment of the popliteal occlusion. EXAM: ULTRASOUND GUIDED ACCESS OF RIGHT COMMON FEMORAL ARTERY ANGIOGRAM LEFT LOWER EXTREMITY REVASCULARIZATION OF OCCLUDED POPLITEAL ARTERY WITH ANGIOPLASTY AND STENTING. REVASCULARIZATION OF OCCLUDED TIBIOPERONEAL ARTERY AND POSTERIOR TIBIAL ARTERY WITH BALLOON ANGIOPLASTY AND RESTORATION OF IN-LINE FLOW TO THE ANKLE MEDICATIONS: 200 MCG NITROGLYCERIN INTRA-ARTERIAL 8000 UNITS HEPARIN FOR ANTI COAGULATION 5 MG HYDRALAZINE ANESTHESIA/SEDATION: Moderate (conscious) sedation was employed during this procedure. A total of Versed 2.0 mg and Fentanyl 100 mcg was administered intravenously. Moderate Sedation Time: 180 minutes. The patient's level of consciousness and vital signs were monitored continuously by radiology nursing throughout the procedure under my direct supervision. CONTRAST:  200 cc Isovue-300 FLUOROSCOPY TIME:  Fluoroscopy Time: 38 minutes 12 seconds (104.9 mGy). COMPLICATIONS: None PROCEDURE: Informed consent was obtained from the patient following explanation of the procedure, risks, benefits and alternatives. The patient understands, agrees and consents for the procedure. All questions were addressed. A time out was performed prior to the initiation of the procedure. Maximal barrier sterile technique utilized including caps, mask, sterile gowns, sterile gloves, large sterile drape, hand hygiene,  and Betadine prep. Ultrasound survey of the right inguinal region was performed with images stored and sent to PACs. A micropuncture needle was used access the right common femoral artery under ultrasound. With excellent arterial blood flow returned, and an .018 micro wire was passed through the needle, observed enter the abdominal aorta under fluoroscopy. The needle was removed, and a micropuncture sheath was placed over the wire. The inner dilator and wire were removed, and an 035 Bentson wire was advanced under fluoroscopy into the abdominal aorta. The sheath was removed and a standard 5 [place] vascular sheath was placed. The dilator was removed and the sheath was flushed. Omni Flush catheter was advanced over the Bentson wire to the aortic bifurcation. Pelvic angiogram performed. Omni Flush catheter was then used to navigate the Bentson wire into the left external iliac artery. Omni Flush catheter was removed, and 100 cm vertebral catheter was advanced into the external iliac artery. Angiogram of the left lower extremity was performed. Rosen wire was then placed through the vertebral catheter and the 5 French short sheath was exchanged for a 6 [place] braided 65cm Arrow sheath. Sheath was advanced into the proximal superficial femoral artery. Multiple obliquities at the left knee were required in order to visualize the occlusion of the popliteal artery given the left knee arthroplasty hardware. Once adequate angiogram was performed, the proximal  occlusion was engaged with the vertebral catheter and an 035 Coons wire. The occlusion was traversed with the vertebral catheter, with the vertebral catheter in the below knee popliteal artery. Wire was removed and a luminal position was confirmed with injection. 014 BMW wire was then advanced through the vertebral catheter and the vertebral catheter was removed. Balloon angioplasty was then performed to a diameter of 4 mm along the length of the abnormal distal  superficial femoral artery and the popliteal occlusion into the below knee popliteal artery. A Supera 4 mm x 80 mm was deployed in the popliteal artery across the occluded site, with the stent structure extended proximally with a 5 mm x 80 mm stent. Repeat angiogram was performed. Balloon angioplasty was then performed along the length of the stent site with 5 mm by 60 mm. Combination of angled CXi catheter and 016 Fathom wire were then used to engage the distal tibioperoneal trunk occlusion and the posterior tibial artery occlusion. This catheter and wire combination were successful with traversing the inclusion. Once the wire was removed and a luminal position was confirmed, the BMW wire was advanced through the CXI catheter into the posterior tibial artery. Balloon angioplasty was performed with 3 mm diameter balloon. Angiogram was performed, confirming patency to the ankle. All catheters and wires were removed. Final angiogram was performed. The sheath was then withdrawn to the right external iliac artery and angiogram was performed. The braided sheath was then exchanged for a short 6 [place] sheath. Given that the ACT greater than 150 at the time of conclusion, sheath remain in place on a pressurized saline bag while heparin was metabolites. The patient tolerated the procedure well and remained hemodynamically stable throughout. No complications were encountered and no significant blood loss. FINDINGS: Ultrasound survey of the right common femoral artery demonstrates mild calcifications with patent vessel. Angiogram of the pelvic vasculature demonstrates dense calcium with mild atherosclerotic changes bilaterally. Bilateral L4 and L5 lumbar arteries are patent. Bilateral hypogastric arteries are patent with patent pelvic vasculature. No significant stenosis of the right or left external iliac arteries. Angiogram of the left lower extremity demonstrates mild disease of the proximal left superficial femoral artery  with patent profunda femoris. 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 The below knee popliteal artery is patent from collateral flow. Anterior tibial artery origin is patent, occluded in the proximal third beyond the origin. Distal tibioperoneal trunk is occluded with occlusion extending into the proximal posterior tibial artery and peroneal artery. Collateral flow reconstitutes the distal peroneal artery, posterior tibial artery, and anterior tibial artery which remain patent at the ankle. There is moderate disease of the distal posterior tibial artery involving the common plantar, with the lateral plantar artery remain patent. Status post treatment of popliteal occlusion and placement of overlapping stent system proximally into the disease superficial femoral artery, there is excellent flow through the SFA and popliteal artery with no dissection. Continued flow through the proximal anterior tibial artery. Status post treatment of tibioperoneal occlusion and revascularization of the posterior tibial artery there is in-line flow restored through the posterior tibial artery to the ankle. At the conclusion of the case there is palpable posterior tibial pulse on the left. IMPRESSION: Status post left lower extremity angiogram and revascularization of chronic total occlusion of popliteal artery, distal TP trunk, and origin of the posterior tibial artery with restoration of in-line flow  to the ankle via the posterior tibial artery after stenting of the popliteal occlusion. Signed, Dulcy Fanny. Earleen Newport DO [facility] Radiology PLAN: The right common femoral artery sheath will stay until with the ACT is below 200. Plan for closure with Exoseal. Gentle hydration IV. Right leg straight until the sheath is out. After the sheath is out the right hip will stay  straight for 4 hours. Plan on DC home today. Should the patient have plateau of wound healing, the next target would be left anterior tibial artery in the angiosome, with previous discussion of tibial access and anterior tibial artery revascularization. Electronically Signed   By: Corrie Mckusick D.O.   On: 03/25/2017 17:16   Ir Fem Pop Art Stent Inc Pta Mod Sed  Result Date: 03/25/2017 INDICATION: 83 year old female with a history of left lower extremity critical limb ischemia and a nonhealing amputation site. Prior ankle brachial index measures in the mild range of arterial occlusive disease, with the segmental exam demonstrating evidence of a distal femoral or popliteal occlusion. CT performed 03/04/2017 confirms popliteal occlusion with questionable patency of the tibial vessels. She presents today for an angiogram and attempted treatment of the popliteal occlusion. EXAM: ULTRASOUND GUIDED ACCESS OF RIGHT COMMON FEMORAL ARTERY ANGIOGRAM LEFT LOWER EXTREMITY REVASCULARIZATION OF OCCLUDED POPLITEAL ARTERY WITH ANGIOPLASTY AND STENTING. REVASCULARIZATION OF OCCLUDED TIBIOPERONEAL ARTERY AND POSTERIOR TIBIAL ARTERY WITH BALLOON ANGIOPLASTY AND RESTORATION OF IN-LINE FLOW TO THE ANKLE MEDICATIONS: 200 MCG NITROGLYCERIN INTRA-ARTERIAL 8000 UNITS HEPARIN FOR ANTI COAGULATION 5 MG HYDRALAZINE ANESTHESIA/SEDATION: Moderate (conscious) sedation was employed during this procedure. A total of Versed 2.0 mg and Fentanyl 100 mcg was administered intravenously. Moderate Sedation Time: 180 minutes. The patient's level of consciousness and vital signs were monitored continuously by radiology nursing throughout the procedure under my direct supervision. CONTRAST:  200 cc Isovue-300 FLUOROSCOPY TIME:  Fluoroscopy Time: 38 minutes 12 seconds (104.9 mGy). COMPLICATIONS: None PROCEDURE: Informed consent was obtained from the patient following explanation of the procedure, risks, benefits and alternatives. The patient understands,  agrees and consents for the procedure. All questions were addressed. A time out was performed prior to the initiation of the procedure. Maximal barrier sterile technique utilized including caps, mask, sterile gowns, sterile gloves, large sterile drape, hand hygiene, and Betadine prep. Ultrasound survey of the right inguinal region was performed with images stored and sent to PACs. A micropuncture needle was used access the right common femoral artery under ultrasound. With excellent arterial blood flow returned, and an .018 micro wire was passed through the needle, observed enter the abdominal aorta under fluoroscopy. The needle was removed, and a micropuncture sheath was placed over the wire. The inner dilator and wire were removed, and an 035 Bentson wire was advanced under fluoroscopy into the abdominal aorta. The sheath was removed and a standard 5 [place] vascular sheath was placed. The dilator was removed and the sheath was flushed. Omni Flush catheter was advanced over the Bentson wire to the aortic bifurcation. Pelvic angiogram performed. Omni Flush catheter was then used to navigate the Bentson wire into the left external iliac artery. Omni Flush catheter was removed, and 100 cm vertebral catheter was advanced into the external iliac artery. Angiogram of the left lower extremity was performed. Rosen wire was then placed through the vertebral catheter and the 5 French short sheath was exchanged for a 6 [place] braided 65cm Arrow sheath. Sheath was advanced into the proximal superficial femoral artery. Multiple obliquities at the left knee were required  in order to visualize the occlusion of the popliteal artery given the left knee arthroplasty hardware. Once adequate angiogram was performed, the proximal occlusion was engaged with the vertebral catheter and an 035 Co[place] wire. The occlusion was traversed with the vertebral catheter, with the vertebral catheter in the below knee popliteal artery. Wire was  removed and a luminal position was confirmed with injection. 014 BMW wire was then advanced through the vertebral catheter and the vertebral catheter was removed. Balloon angioplasty was then performed to a diameter of 4 mm along the length of the abnormal distal superficial femoral artery and the popliteal occlusion into the below knee popliteal artery. A Supera 4 mm x 80 mm was deployed in the popliteal artery across the occluded site, with the stent structure extended proximally with a 5 mm x 80 mm stent. Repeat angiogram was performed. Balloon angioplasty was then performed along the length of the stent site with 5 mm by 60 mm. Combination of angled CXi catheter and 016 Fathom wire were then used to engage the distal tibioperoneal trunk occlusion and the posterior tibial artery occlusion. This catheter and wire combination were successful with traversing the inclusion. Once the wire was removed and a luminal position was confirmed, the BMW wire was advanced through the CXI catheter into the posterior tibial artery. Balloon angioplasty was performed with 3 mm diameter balloon. Angiogram was performed, confirming patency to the ankle. All catheters and wires were removed. Final angiogram was performed. The sheath was then withdrawn to the right external iliac artery and angiogram was performed. The braided sheath was then exchanged for a short 6 [place] sheath. Given that the ACT greater than 150 at the time of conclusion, sheath remain in place on a pressurized saline bag while heparin was metabolites. The patient tolerated the procedure well and remained hemodynamically stable throughout. No complications were encountered and no significant blood loss. FINDINGS: Ultrasound survey of the right common femoral artery demonstrates mild calcifications with patent vessel. Angiogram of the pelvic vasculature demonstrates dense calcium with mild atherosclerotic changes bilaterally. Bilateral L4 and L5 lumbar arteries are  patent. Bilateral hypogastric arteries are patent with patent pelvic vasculature. No significant stenosis of the right or left external iliac arteries. Angiogram of the left lower extremity demonstrates mild disease of the proximal left superficial femoral artery with patent profunda femoris. 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 The below knee popliteal artery is patent from collateral flow. Anterior tibial artery origin is patent, occluded in the proximal third beyond the origin. Distal tibioperoneal trunk is occluded with occlusion extending into the proximal posterior tibial artery and peroneal artery. Collateral flow reconstitutes the distal peroneal artery, posterior tibial artery, and anterior tibial artery which remain patent at the ankle. There is moderate disease of the distal posterior tibial artery involving the common plantar, with the lateral plantar artery remain patent. Status post treatment of popliteal occlusion and placement of overlapping stent system proximally into the disease superficial femoral artery, there is excellent flow through the SFA and popliteal artery with no dissection. Continued flow through the proximal anterior tibial artery. Status post treatment of tibioperoneal occlusion and revascularization of the posterior tibial artery there is in-line flow restored through the posterior tibial artery to the ankle. At the conclusion of the case there is palpable posterior tibial pulse on the left. IMPRESSION: Status post left lower extremity angiogram and  revascularization of chronic total occlusion of popliteal artery, distal TP trunk, and origin of the posterior tibial artery with restoration of in-line flow to the ankle via the posterior tibial artery after stenting of the popliteal occlusion. Signed, Dulcy Fanny. Earleen Newport DO [facility] Radiology PLAN: The right common femoral artery sheath will stay until with the ACT is below 200. Plan for closure with Exoseal. Gentle hydration IV. Right leg straight until the sheath is out. After the sheath is out the right hip will stay straight for 4 hours. Plan on DC home today. Should the patient have plateau of wound healing, the next target would be left anterior tibial artery in the angiosome, with previous discussion of tibial access and anterior tibial artery revascularization. Electronically Signed   By: Corrie Mckusick D.O.   On: 03/25/2017 17:16   Ir Tib-pero Art Pta Mod Sed  Result Date: 03/25/2017 INDICATION: 83 year old female with a history of left lower extremity critical limb ischemia and a nonhealing amputation site. Prior ankle brachial index measures in the mild range of arterial occlusive disease, with the segmental exam demonstrating evidence of a distal femoral or popliteal occlusion. CT performed 03/04/2017 confirms popliteal occlusion with questionable patency of the tibial vessels. She presents today for an angiogram and attempted treatment of the popliteal occlusion. EXAM: ULTRASOUND GUIDED ACCESS OF RIGHT COMMON FEMORAL ARTERY ANGIOGRAM LEFT LOWER EXTREMITY REVASCULARIZATION OF OCCLUDED POPLITEAL ARTERY WITH ANGIOPLASTY AND STENTING. REVASCULARIZATION OF OCCLUDED TIBIOPERONEAL ARTERY AND POSTERIOR TIBIAL ARTERY WITH BALLOON ANGIOPLASTY AND RESTORATION OF IN-LINE FLOW TO THE ANKLE MEDICATIONS: 200 MCG NITROGLYCERIN INTRA-ARTERIAL 8000 UNITS HEPARIN FOR ANTI COAGULATION 5 MG HYDRALAZINE ANESTHESIA/SEDATION: Moderate (conscious) sedation was employed during this procedure. A total of Versed 2.0 mg and Fentanyl 100 mcg was administered intravenously. Moderate Sedation Time: 180 minutes. The patient's level of consciousness and vital signs were monitored continuously by radiology nursing throughout the procedure under my direct supervision. CONTRAST:  200 cc  Isovue-300 FLUOROSCOPY TIME:  Fluoroscopy Time: 38 minutes 12 seconds (104.9 mGy). COMPLICATIONS: None PROCEDURE: Informed consent was obtained from the patient following explanation of the procedure, risks, benefits and alternatives. The patient understands, agrees and consents for the procedure. All questions were addressed. A time out was performed prior to the initiation of the procedure. Maximal barrier sterile technique utilized including caps, mask, sterile gowns, sterile gloves, large sterile drape, hand hygiene, and Betadine prep. Ultrasound survey of the right inguinal region was performed with images stored and sent to PACs. A micropuncture needle was used access the right common femoral artery under ultrasound. With excellent arterial blood flow returned, and an .018 micro wire was passed through the needle, observed enter the abdominal aorta under fluoroscopy. The needle was removed, and a micropuncture sheath was placed over the wire. The inner dilator and wire were removed, and an 035 Bentson wire was advanced under fluoroscopy into the abdominal aorta. The sheath was removed and a standard 5 [place] vascular sheath was placed. The dilator was removed and the sheath was flushed. Omni Flush catheter was advanced over the Bentson wire to the aortic bifurcation. Pelvic angiogram performed. Omni Flush catheter was then used to navigate the Bentson wire into the left external iliac artery. Omni Flush catheter was removed, and 100 cm vertebral catheter was advanced into the external iliac artery. Angiogram of the left lower extremity was performed. Rosen wire was then placed through the vertebral catheter and the 5 [place] short sheath was exchanged for a 6 [place] braided  65cm Arrow sheath. Sheath was advanced into the proximal superficial femoral artery. Multiple obliquities at the left knee were required in order to visualize the occlusion of the popliteal artery given the left knee arthroplasty hardware.  Once adequate angiogram was performed, the proximal occlusion was engaged with the vertebral catheter and an 035 Coons wire. The occlusion was traversed with the vertebral catheter, with the vertebral catheter in the below knee popliteal artery. Wire was removed and a luminal position was confirmed with injection. 014 BMW wire was then advanced through the vertebral catheter and the vertebral catheter was removed. Balloon angioplasty was then performed to a diameter of 4 mm along the length of the abnormal distal superficial femoral artery and the popliteal occlusion into the below knee popliteal artery. A Supera 4 mm x 80 mm was deployed in the popliteal artery across the occluded site, with the stent structure extended proximally with a 5 mm x 80 mm stent. Repeat angiogram was performed. Balloon angioplasty was then performed along the length of the stent site with 5 mm by 60 mm. Combination of angled CXi catheter and 016 Fathom wire were then used to engage the distal tibioperoneal trunk occlusion and the posterior tibial artery occlusion. This catheter and wire combination were successful with traversing the inclusion. Once the wire was removed and a luminal position was confirmed, the BMW wire was advanced through the CXI catheter into the posterior tibial artery. Balloon angioplasty was performed with 3 mm diameter balloon. Angiogram was performed, confirming patency to the ankle. All catheters and wires were removed. Final angiogram was performed. The sheath was then withdrawn to the right external iliac artery and angiogram was performed. The braided sheath was then exchanged for a short 6 [place] sheath. Given that the ACT greater than 150 at the time of conclusion, sheath remain in place on a pressurized saline bag while heparin was metabolites. The patient tolerated the procedure well and remained hemodynamically stable throughout. No complications were encountered and no significant blood loss. FINDINGS:  Ultrasound survey of the right common femoral artery demonstrates mild calcifications with patent vessel. Angiogram of the pelvic vasculature demonstrates dense calcium with mild atherosclerotic changes bilaterally. Bilateral L4 and L5 lumbar arteries are patent. Bilateral hypogastric arteries are patent with patent pelvic vasculature. No significant stenosis of the right or left external iliac arteries. Angiogram of the left lower extremity demonstrates mild disease of the proximal left superficial femoral artery with patent profunda femoris. 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 The below knee popliteal artery is patent from collateral flow. Anterior tibial artery origin is patent, occluded in the proximal third beyond the origin. Distal tibioperoneal trunk is occluded with occlusion extending into the proximal posterior tibial artery and peroneal artery. Collateral flow reconstitutes the distal peroneal artery, posterior tibial artery, and anterior tibial artery which remain patent at the ankle. There is moderate disease of the distal posterior tibial artery involving the common plantar, with the lateral plantar artery remain patent. Status post treatment of popliteal occlusion and placement of overlapping stent system proximally into the disease superficial femoral artery, there is excellent flow through the SFA and popliteal artery with no dissection. Continued flow through the proximal anterior tibial artery. Status post treatment of tibioperoneal occlusion and revascularization of the posterior tibial artery there is in-line flow restored through the posterior tibial artery to the ankle. At the conclusion  of the case there is palpable posterior tibial pulse on the left. IMPRESSION: Status post left lower extremity angiogram and revascularization of chronic total occlusion of  popliteal artery, distal TP trunk, and origin of the posterior tibial artery with restoration of in-line flow to the ankle via the posterior tibial artery after stenting of the popliteal occlusion. Signed, Dulcy Fanny. Earleen Newport DO [facility] Radiology PLAN: The right common femoral artery sheath will stay until with the ACT is below 200. Plan for closure with Exoseal. Gentle hydration IV. Right leg straight until the sheath is out. After the sheath is out the right hip will stay straight for 4 hours. Plan on DC home today. Should the patient have plateau of wound healing, the next target would be left anterior tibial artery in the angiosome, with previous discussion of tibial access and anterior tibial artery revascularization. Electronically Signed   By: Corrie Mckusick D.O.   On: 03/25/2017 17:16   Ir US Guide Vasc Access Right  Result Date: 04/08/2017 INDICATION: 83 year old female with acute thrombosis of left superficial femoral artery stent system. SFA and popliteal stent system placed 03/25/2017 for treatment of critical limb ischemia and nonhealing wound. She returns today for initiation of thrombolysis and treatment EXAM: ULTRASOUND GUIDED ACCESS RIGHT COMMON FEMORAL ARTERY ANGIOGRAM LEFT LOWER EXTREMITY INITIATION OF ARTERIAL THROMBOLYSIS WITH CATHETER DIRECTED THERAPY MEDICATIONS: None ANESTHESIA/SEDATION: Moderate (conscious) sedation was employed during this procedure. A total of Versed 2.0 mg and Fentanyl 100 mcg was administered intravenously. Moderate Sedation Time: 45 minutes. The patient's level of consciousness and vital signs were monitored continuously by radiology nursing throughout the procedure under my direct supervision. CONTRAST:  40 cc Isovue FLUOROSCOPY TIME:  Fluoroscopy Time: 10 minutes 24 seconds (67 mGy). COMPLICATIONS: None PROCEDURE: Informed consent was obtained from the patient following explanation of the procedure, risks, benefits  and alternatives. The patient understands, agrees and consents for the procedure. All questions were addressed. A time out was performed prior to the initiation of the procedure. Maximal barrier sterile technique utilized including caps, mask, sterile gowns, sterile gloves, large sterile drape, hand hygiene, and Betadine prep. Ultrasound survey of the right inguinal region was performed with images stored and sent to PACs. A micropuncture needle was used access the right common femoral artery under ultrasound. With excellent arterial blood flow returned, and an .018 micro wire was passed through the needle, observed enter the abdominal aorta under fluoroscopy. The needle was removed, and a micropuncture sheath was placed over the wire. The inner dilator and wire were removed, and an 035 Bentson wire was advanced under fluoroscopy into the abdominal aorta. The sheath was removed and a standard 5 [place] vascular sheath was placed. The dilator was removed and the sheath was flushed. Omni Flush catheter was advanced over the Bentson wire into the abdominal aorta and the Bentson wire was retracted to form the curve on the catheter. Catheter and wire were then directed down the contralateral common iliac artery. The wire was advanced to the superficial femoral artery. Catheter was removed an a 5 French vertebral catheter was placed. Angiogram of the left lower extremity was then performed. Rosen wire was then passed through the catheter into the superficial femoral artery in the catheter was removed. Sheath was removed. A 6 French 45 cm Arrow braided sheath was then placed over the wire into the distal external iliac artery. Vertebral catheter was then placed over the Rosen wire in the [place] wire was removed. Angiogram was performed.  Combination of Rosen wire and the vertebral catheter were then used to traverse the proximal 2/3 of the thrombosed superficial femoral artery. When the catheter reached the popliteal artery  there was significant resistance. Vertebral catheter was removed and a Uni fuse infusion catheter, 135 cm length, 40 cm infusion length was selected. This was placed over the [facility] wire. Rosen wire was then was removed and a stiff Glidewire was then used. The tip of the Uni fuse catheter was slowly advanced into the origin of the posterior tibial artery. Glidewire was removed, limited angiogram was performed, and then the obturator wire was placed. Patient tolerated the procedure well. Patient remained hemodynamically stable throughout. No complications were encountered.  No significant blood loss FINDINGS: Angiogram demonstrates occlusion of the stent system of the distal superficial femoral artery an the popliteal artery. There is no significant reconstitution of flow into the distal tibial arteries, however, patency of the distal popliteal artery, tibioperoneal trunk, and the proximal 1/3 of the anterior tibial artery was evident. The proximal posterior tibial artery was occluded. The anterior tibial artery is again occluded proximally, which was the case on the prior angiogram. There is critical stenosis at the origin of the peroneal artery. After placement of the infusion catheter, injection demonstrates partial filling of the posterior tibial artery. IMPRESSION: Status post ultrasound guided access of right common femoral artery and placement of a 40 cm length infusion catheter across acute thrombosis of the left superficial femoral artery and popliteal artery for initiation of thrombolysis. Signed, Dulcy Fanny. Earleen [place], DO [facility] Radiology Electronically Signed   By: Corrie Mckusick D.O.   On: 04/08/2017 13:41   Ir US Guide Vasc Access Right  Result Date: 03/25/2017 INDICATION: 83 year old female with a history of left lower extremity critical limb ischemia and a nonhealing amputation site. Prior ankle brachial index measures in the mild range of arterial  occlusive disease, with the segmental exam demonstrating evidence of a distal femoral or popliteal occlusion. CT performed 03/04/2017 confirms popliteal occlusion with questionable patency of the tibial vessels. She presents today for an angiogram and attempted treatment of the popliteal occlusion. EXAM: ULTRASOUND GUIDED ACCESS OF RIGHT COMMON FEMORAL ARTERY ANGIOGRAM LEFT LOWER EXTREMITY REVASCULARIZATION OF OCCLUDED POPLITEAL ARTERY WITH ANGIOPLASTY AND STENTING. REVASCULARIZATION OF OCCLUDED TIBIOPERONEAL ARTERY AND POSTERIOR TIBIAL ARTERY WITH BALLOON ANGIOPLASTY AND RESTORATION OF IN-LINE FLOW TO THE ANKLE MEDICATIONS: 200 MCG NITROGLYCERIN INTRA-ARTERIAL 8000 UNITS HEPARIN FOR ANTI COAGULATION 5 MG HYDRALAZINE ANESTHESIA/SEDATION: Moderate (conscious) sedation was employed during this procedure. A total of Versed 2.0 mg and Fentanyl 100 mcg was administered intravenously. Moderate Sedation Time: 180 minutes. The patient's level of consciousness and vital signs were monitored continuously by radiology nursing throughout the procedure under my direct supervision. CONTRAST:  200 cc Isovue-300 FLUOROSCOPY TIME:  Fluoroscopy Time: 38 minutes 12 seconds (104.9 mGy). COMPLICATIONS: None PROCEDURE: Informed consent was obtained from the patient following explanation of the procedure, risks, benefits and alternatives. The patient understands, agrees and consents for the procedure. All questions were addressed. A time out was performed prior to the initiation of the procedure. Maximal barrier sterile technique utilized including caps, mask, sterile gowns, sterile gloves, large sterile drape, hand hygiene, and Betadine prep. Ultrasound survey of the right inguinal region was performed with images stored and sent to PACs. A micropuncture needle was used access the right common femoral artery under ultrasound. With excellent arterial blood flow returned, and an .018 micro wire was passed through the needle, observed enter  the abdominal aorta under fluoroscopy. The needle was removed, and a micropuncture sheath was placed over the wire. The inner dilator and wire were removed, and an 035 Bentson wire was advanced under fluoroscopy into the abdominal aorta. The sheath was removed and a standard 5 [place] vascular sheath was placed. The dilator was removed and the sheath was flushed. Omni Flush catheter was advanced over the Bentson wire to the aortic bifurcation. Pelvic angiogram performed. Omni Flush catheter was then used to navigate the Bentson wire into the left external iliac artery. Omni Flush catheter was removed, and 100 cm vertebral catheter was advanced into the external iliac artery. Angiogram of the left lower extremity was performed. Rosen wire was then placed through the vertebral catheter and the 5 French short sheath was exchanged for a 6 [place] braided 65cm Arrow sheath. Sheath was advanced into the proximal superficial femoral artery. Multiple obliquities at the left knee were required in order to visualize the occlusion of the popliteal artery given the left knee arthroplasty hardware. Once adequate angiogram was performed, the proximal occlusion was engaged with the vertebral catheter and an 035 [place] wire. The occlusion was traversed with the vertebral catheter, with the vertebral catheter in the below knee popliteal artery. Wire was removed and a luminal position was confirmed with injection. 014 BMW wire was then advanced through the vertebral catheter and the vertebral catheter was removed. Balloon angioplasty was then performed to a diameter of 4 mm along the length of the abnormal distal superficial femoral artery and the popliteal occlusion into the below knee popliteal artery. A Supera 4 mm x 80 mm was deployed in the popliteal artery across the occluded site, with the stent structure extended proximally with a 5 mm x 80 mm stent. Repeat angiogram was performed. Balloon angioplasty was then performed along  the length of the stent site with 5 mm by 60 mm. Combination of angled CXi catheter and 016 Fathom wire were then used to engage the distal tibioperoneal trunk occlusion and the posterior tibial artery occlusion. This catheter and wire combination were successful with traversing the inclusion. Once the wire was removed and a luminal position was confirmed, the BMW wire was advanced through the CXI catheter into the posterior tibial artery. Balloon angioplasty was performed with 3 mm diameter balloon. Angiogram was performed, confirming patency to the ankle. All catheters and wires were removed. Final angiogram was performed. The sheath was then withdrawn to the right external iliac artery and angiogram was performed. The braided sheath was then exchanged for a short 6 [place] sheath. Given that the ACT greater than 150 at the time of conclusion, sheath remain in place on a pressurized saline bag while heparin was metabolites. The patient tolerated the procedure well and remained hemodynamically stable throughout. No complications were encountered and no significant blood loss. FINDINGS: Ultrasound survey of the right common femoral artery demonstrates mild calcifications with patent vessel. Angiogram of the pelvic vasculature demonstrates dense calcium with mild atherosclerotic changes bilaterally. Bilateral L4 and L5 lumbar arteries are patent. Bilateral hypogastric arteries are patent with patent pelvic vasculature. No significant stenosis of the right or left external iliac arteries. Angiogram of the left lower extremity demonstrates mild disease of the proximal left superficial femoral artery with patent profunda femoris. 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 The below knee popliteal artery is patent from collateral flow. Anterior tibial  artery origin is patent, occluded in the proximal  third beyond the origin. Distal tibioperoneal trunk is occluded with occlusion extending into the proximal posterior tibial artery and peroneal artery. Collateral flow reconstitutes the distal peroneal artery, posterior tibial artery, and anterior tibial artery which remain patent at the ankle. There is moderate disease of the distal posterior tibial artery involving the common plantar, with the lateral plantar artery remain patent. Status post treatment of popliteal occlusion and placement of overlapping stent system proximally into the disease superficial femoral artery, there is excellent flow through the SFA and popliteal artery with no dissection. Continued flow through the proximal anterior tibial artery. Status post treatment of tibioperoneal occlusion and revascularization of the posterior tibial artery there is in-line flow restored through the posterior tibial artery to the ankle. At the conclusion of the case there is palpable posterior tibial pulse on the left. IMPRESSION: Status post left lower extremity angiogram and revascularization of chronic total occlusion of popliteal artery, distal TP trunk, and origin of the posterior tibial artery with restoration of in-line flow to the ankle via the posterior tibial artery after stenting of the popliteal occlusion. Signed, Dulcy Fanny. Earleen [place] DO [facility] Radiology PLAN: The right common femoral artery sheath will stay until with the ACT is below 200. Plan for closure with Exoseal. Gentle hydration IV. Right leg straight until the sheath is out. After the sheath is out the right hip will stay straight for 4 hours. Plan on DC home today. Should the patient have plateau of wound healing, the next target would be left anterior tibial artery in the angiosome, with previous discussion of tibial access and anterior tibial artery revascularization. Electronically Signed   By: Corrie Mckusick D.O.   On: 03/25/2017 17:16    Ir Infusion Thrombol Arterial Initial (ms)  Result Date: 04/08/2017 INDICATION: 83 year old female with acute thrombosis of left superficial femoral artery stent system. SFA and popliteal stent system placed 03/25/2017 for treatment of critical limb ischemia and nonhealing wound. She returns today for initiation of thrombolysis and treatment EXAM: ULTRASOUND GUIDED ACCESS RIGHT COMMON FEMORAL ARTERY ANGIOGRAM LEFT LOWER EXTREMITY INITIATION OF ARTERIAL THROMBOLYSIS WITH CATHETER DIRECTED THERAPY MEDICATIONS: None ANESTHESIA/SEDATION: Moderate (conscious) sedation was employed during this procedure. A total of Versed 2.0 mg and Fentanyl 100 mcg was administered intravenously. Moderate Sedation Time: 45 minutes. The patient's level of consciousness and vital signs were monitored continuously by radiology nursing throughout the procedure under my direct supervision. CONTRAST:  40 cc Isovue FLUOROSCOPY TIME:  Fluoroscopy Time: 10 minutes 24 seconds (67 mGy). COMPLICATIONS: None PROCEDURE: Informed consent was obtained from the patient following explanation of the procedure, risks, benefits and alternatives. The patient understands, agrees and consents for the procedure. All questions were addressed. A time out was performed prior to the initiation of the procedure. Maximal barrier sterile technique utilized including caps, mask, sterile gowns, sterile gloves, large sterile drape, hand hygiene, and Betadine prep. Ultrasound survey of the right inguinal region was performed with images stored and sent to PACs. A micropuncture needle was used access the right common femoral artery under ultrasound. With excellent arterial blood flow returned, and an .018 micro wire was passed through the needle, observed enter the abdominal aorta under fluoroscopy. The needle was removed, and a micropuncture sheath was placed over the wire. The inner dilator and wire were removed, and an 035 Bentson wire was advanced under  fluoroscopy into the abdominal aorta. The sheath was removed and a standard 5  [place] vascular sheath was placed. The dilator was removed and the sheath was flushed. Omni Flush catheter was advanced over the Bentson wire into the abdominal aorta and the Bentson wire was retracted to form the curve on the catheter. Catheter and wire were then directed down the contralateral common iliac artery. The wire was advanced to the superficial femoral artery. Catheter was removed an a 5 French vertebral catheter was placed. Angiogram of the left lower extremity was then performed. Rosen wire was then passed through the catheter into the superficial femoral artery in the catheter was removed. Sheath was removed. A 6 French 45 cm Arrow braided sheath was then placed over the wire into the distal external iliac artery. Vertebral catheter was then placed over the Rosen wire in the [place] wire was removed. Angiogram was performed. Combination of Rosen wire and the vertebral catheter were then used to traverse the proximal 2/3 of the thrombosed superficial femoral artery. When the catheter reached the popliteal artery there was significant resistance. Vertebral catheter was removed and a Uni fuse infusion catheter, 135 cm length, 40 cm infusion length was selected. This was placed over the [facility] wire. Rosen wire was then was removed and a stiff Glidewire was then used. The tip of the Uni fuse catheter was slowly advanced into the origin of the posterior tibial artery. Glidewire was removed, limited angiogram was performed, and then the obturator wire was placed. Patient tolerated the procedure well. Patient remained hemodynamically stable throughout. No complications were encountered.  No significant blood loss FINDINGS: Angiogram demonstrates occlusion of the stent system of the distal superficial femoral artery an the popliteal artery. There is no significant reconstitution of flow into the distal tibial arteries, however, patency  of the distal popliteal artery, tibioperoneal trunk, and the proximal 1/3 of the anterior tibial artery was evident. The proximal posterior tibial artery was occluded. The anterior tibial artery is again occluded proximally, which was the case on the prior angiogram. There is critical stenosis at the origin of the peroneal artery. After placement of the infusion catheter, injection demonstrates partial filling of the posterior tibial artery. IMPRESSION: Status post ultrasound guided access of right common femoral artery and placement of a 40 cm length infusion catheter across acute thrombosis of the left superficial femoral artery and popliteal artery for initiation of thrombolysis. Signed, Dulcy Fanny. Earleen [place], DO [facility] Radiology Electronically Signed   By: Corrie Mckusick D.O.   On: 04/08/2017 13:41   Ir Jacolyn Reedy F/u Eval Art/ven Final Day (ms)  Result Date: 04/09/2017 INDICATION: Chronic left limb ischemia, status post revascularization of chronic total occlusion of popliteal artery through proximal posterior tibial artery 03/25/2017. Patient presented with distal occlusion, and is status post overnight catheter directed thrombolytic infusion. EXAM: LEFT LOWER EXTREMITY ARTERIOGRAM THROUGH EXISTING CATHETER MEDICATIONS: None required ANESTHESIA/SEDATION: None required CONTRAST:  60 mL Isovue-300 FLUOROSCOPY TIME:  3 minutes, 57 mGy COMPLICATIONS: None immediate. PROCEDURE: Informed consent was obtained from the patient following explanation of the procedure, risks, benefits and alternatives. The patient understands, agrees and consents for the procedure. All questions were addressed. A time out was performed prior to the initiation of the procedure. Contrast was injected through previously placed the right femoral sheath for left lower extremity arteriography. At the conclusion of the procedure, the infusion catheter was removed. The patient tolerated the  procedure well. FINDINGS: Patent left profunda femoris. Atheromatous SFA with tandem areas of mild to moderate stenosis in its mid portion. Clearance of thrombus  from the popliteal arterial stent. No residual thrombus or stenosis. Origin occlusion of the anterior tibial artery. Short-segment moderate stenosis just beyond the origin of the tibioperoneal trunk. Patent posterior tibial artery to just below the distal calf above the ankle as before ; no direct outflow to the plantar arch. Short-segment origin stenosis of the peroneal artery of at least moderate severity, patent distally with a prominent anterior communicating branch crossing the ankle. Reconstitution of the distal anterior tibial artery the mid calf level, patent across the ankle as dorsalis pedis. IMPRESSION: 1. Interval complete clearance of acute left lower extremity arterial thrombus after overnight catheter directed thrombolytic infusion. 2. Restoration of inline flow through the posterior tibial artery to the ankle. 3. Tandem stenoses in the mid SFA and tibioperoneal trunk. 4. Collateral reconstitution of peroneal and anterior tibial distal runoff as above. Electronically Signed   By: Lucrezia Europe M.D.   On: 04/09/2017 15:02     CBC  Recent Labs Lab 04/09/17 0127 04/09/17 0552 04/12/17 1452 04/12/17 1628 04/13/17 0022 04/14/17 0946  WBC 15.4* 13.8* 14.1*  --  20.1* 15.5*  HGB 13.4 13.1 10.6* 10.5* 11.7* 9.8*  HCT 40.4 39.0 33.1* 31.0* 35.2* 30.1*  PLT 218 235 239  --  183 180  MCV 95.1 96.1 95.9  --  90.0 92.6  MCH 31.5 32.3 30.7  --  29.9 30.2  MCHC 33.2 33.6 32.0  --  33.2 32.6  RDW 13.2 13.3 13.4  --  16.7* 17.0*  LYMPHSABS  --   --  1.9  --   --   --   MONOABS  --   --  0.9  --   --   --   EOSABS  --   --  0.3  --   --   --   BASOSABS  --   --  0.0  --   --   --     Chemistries   Recent Labs Lab 04/12/17 1452 04/12/17 1628 04/13/17 0022 04/15/17 0157  NA 140 142 135 137  K 3.7 4.8 4.5 3.8  CL 106  --  105 107   CO2 22  --  22 24  GLUCOSE 309* 342* 354* 153*  BUN 16  --  24* 20  CREATININE 1.05*  --  1.25* 0.83  CALCIUM 9.1  --  8.5* 8.2*  AST  --   --   --  23  ALT  --   --   --  22  ALKPHOS  --   --   --  46  BILITOT  --   --   --  1.1   ------------------------------------------------------------------------------------------------------------------ estimated creatinine clearance is 38.5 mL/min (by C-G formula based on SCr of 0.83 mg/dL). ------------------------------------------------------------------------------------------------------------------  Recent Labs  04/14/17 0244  HGBA1C 6.4*   ------------------------------------------------------------------------------------------------------------------ No results for input(s): CHOL, HDL, LDLCALC, TRIG, CHOLHDL, LDLDIRECT in the last 72 hours. ------------------------------------------------------------------------------------------------------------------ No results for input(s): TSH, T4TOTAL, T3FREE, THYROIDAB in the last 72 hours.  Invalid input(s): FREET3 ------------------------------------------------------------------------------------------------------------------ No results for input(s): VITAMINB12, FOLATE, FERRITIN, TIBC, IRON, RETICCTPCT in the last 72 hours.  Coagulation profile  Recent Labs Lab 04/09/17 0552 04/10/17 0313 04/12/17 1452 04/14/17 0244 04/15/17 0157  INR 1.36 1.27 1.66 1.97 1.44    No results for input(s): DDIMER in the last 72 hours.  Cardiac Enzymes No results for input(s): CKMB, TROPONINI, MYOGLOBIN in the last 168 hours.  Invalid input(s): CK ------------------------------------------------------------------------------------------------------------------ Invalid input(s): POCBNP   CBG:  Recent Labs Lab 04/14/17  1206 04/14/17 1603 04/14/17 2133 04/15/17 0636 04/15/17 1133  GLUCAP 189* 152* 181* 128* 185*       Studies: No results found.    Lab Results  Component  Value Date   HGBA1C 6.4 (H) 04/14/2017   Lab Results  Component Value Date   CREATININE 0.83 04/15/2017       Scheduled Meds: . aspirin EC  325 mg Oral QHS  . calcium carbonate  1,250 mg Oral Q breakfast  . clopidogrel  75 mg Oral Daily  . docusate sodium  100 mg Oral Daily  . feeding supplement  237 mL Oral TID WC  . ferrous sulfate  325 mg Oral Q breakfast  . insulin aspart  0-5 Units Subcutaneous QHS  . insulin aspart  0-9 Units Subcutaneous TID WC  . mouth rinse  15 mL Mouth Rinse BID  . metoprolol tartrate  12.5 mg Oral BID  . pantoprazole  40 mg Oral Daily  . vitamin B-12  1,000 mcg Oral Daily  . Vitamin D (Ergocalciferol)  50,000 Units Oral Q Sun   Continuous Infusions: . lactated ringers 75 mL/hr at 04/15/17 0035     LOS: 3 days    Time spent: >30 MINS    Reyne Dumas  [facility] Pager [phone]. If 7PM-7AM, please contact night-coverage at [email or web address], password [facility] [place] 04/15/2017, 1:44 PM  LOS: 3 days

## 2017-04-16 DIAGNOSIS — S301XXD Contusion of abdominal wall, subsequent encounter: Secondary | ICD-10-CM

## 2017-04-16 LAB — GLUCOSE, CAPILLARY
GLUCOSE-CAPILLARY: 161 mg/dL — AB (ref 65–99)
GLUCOSE-CAPILLARY: 108 mg/dL — AB (ref 65–99)
GLUCOSE-CAPILLARY: 142 mg/dL — AB (ref 65–99)
Glucose-Capillary: 174 mg/dL — ABNORMAL HIGH (ref 65–99)

## 2017-04-16 LAB — PROTIME-INR
INR: 1.2
PROTHROMBIN TIME: 15.3 s — AB (ref 11.4–15.2)

## 2017-04-16 NOTE — Progress Notes (Signed)
[facility] PROGRESS NOTE  [name] ZOX:[id number] DOB: Jul 20, 1928 DOA: 04/12/2017   PCP: Leanna Battles, MD     Assessment/Plan: Active Problems:   Groin swelling   Hematoma of groin   Hematoma   83 y/o woman with a history of reflux, hypertension who was admitted to the ICU after evacuation of a hematoma arising from the femoral artery.  She underwent catheter directed thrombolysis of the left LE on 5/22 and 5/23.  She then presented to the [facility] ED from her nursing home on 5/26 with a large inguinal hematoma.  She underwent evacuation of the hematoma and repair of the femoral artery.  PCCM was asked to admit her after her procedure. [facility] assumed care 5/28  Assessment and plan Evacuation of hematoma. Control bleeding from right femoral artery  Plan per vascular surgery/IR Continue JP drain-Drain in rt groin now---followed by Vascular team. Plan for removal and dc to SNF when bed available.  Follow-up CBC  History of atrial fibrillation, heart rate better controlled today. Resumed  metoprolol, Coumadin on hold Patient on aspirin and Plavix already  Hypertension Blood pressure stable, continue metoprolol  Left lower extremity ischemia-resolved underwent LLE arteriogram with restoration of LLE outflow   on 5/22 and completed on 5/23 with restoration of pulses   Acute blood loss anemia Hemoglobin drop from 14.8 >9.8>10. Hemoglobin stabilized.  currently coumadin still on hold, plan to resume per vascular surgery recommendations  Coughing while eating:  Going on for a while now as per the family.  Ordered SLP evaluation.     DVT prophylaxsis SCDs  Code Status:  Full code    Family Communication: Discussed in detail with the patient, all imaging results, lab results explained to the patient   Disposition Plan:  Anticipate discharge tomorrow       Consultants:  Vascular surgery  Procedures:   Evacuation of  hematoma  Antibiotics: Anti-infectives    Start     Dose/Rate Route Frequency Ordered Stop   04/13/17 0200  cefUROXime (ZINACEF) 1.5 g in dextrose 5 % 50 mL IVPB     1.5 g 100 mL/hr over 30 Minutes Intravenous Every 12 hours 04/12/17 1831 04/13/17 1356         HPI/Subjective:  Denies any chest pain or shortness of breath.   Objective: Vitals:   04/15/17 2007 04/16/17 0401 04/16/17 0619 04/16/17 1330  BP: (!) 160/75 (!) 177/96 (!) 155/91 137/75  Pulse: (!) 106 90  78  Resp: 20 20  17   Temp: 97.7 F (36.5 C) 97.7 F (36.5 C)  97.7 F (36.5 C)  TempSrc: Oral Oral  Oral  SpO2: 91% 100%  95%  Weight:      Height:        Intake/Output Summary (Last 24 hours) at 04/16/17 1724 Last data filed at 04/16/17 1300  Gross per 24 hour  Intake              360 ml  Output             2125 ml  Net            -1765 ml    Exam:  Examination:  General exam: Appears calm and comfortable  Respiratory system: Clear to auscultation. Respiratory effort normal. Cardiovascular system: S1 & S2 heard, irre irre. No JVD, murmurs, rubs, gallops or clicks.  Gastrointestinal system: Abdomen is nondistended, soft and nontender. No organomegaly or masses felt. Normal bowel sounds heard. Central nervous  system: Alert and oriented. No focal neurological deficits. Extremities:  Incisions:  Clean and dry with extensive ecchymosis   Bilateral feet are warm; easily popliteal pulse on the right Skin: No rashes, lesions or ulcers Psychiatry: Judgement and insight appear normal. Mood & affect appropriate.     Data Reviewed: I have personally reviewed following labs and imaging studies  Micro Results Recent Results (from the past 240 hour(s))  MRSA PCR Screening     Status: None   Collection Time: 04/08/17 10:44 AM  Result Value Ref Range Status   MRSA by PCR NEGATIVE NEGATIVE Final    Comment:        The GeneXpert MRSA Assay (FDA approved for NASAL specimens only), is one component of  a comprehensive MRSA colonization surveillance program. It is not intended to diagnose MRSA infection nor to guide or monitor treatment for MRSA infections.   MRSA PCR Screening     Status: None   Collection Time: 04/12/17  6:32 PM  Result Value Ref Range Status   MRSA by PCR NEGATIVE NEGATIVE Final    Comment:        The GeneXpert MRSA Assay (FDA approved for NASAL specimens only), is one component of a comprehensive MRSA colonization surveillance program. It is not intended to diagnose MRSA infection nor to guide or monitor treatment for MRSA infections.     Radiology Reports Ct Abdomen Pelvis Wo Contrast  Result Date: 04/12/2017 CLINICAL DATA:  Right inguinal swelling. EXAM: CT ABDOMEN AND PELVIS WITHOUT CONTRAST TECHNIQUE: Multidetector CT imaging of the abdomen and pelvis was performed following the standard protocol without IV contrast. COMPARISON:  None. FINDINGS: Lower chest: Lung bases are clear. Aortic atherosclerosis noted. Calcifications in the RCA and LAD coronary artery noted. Hepatobiliary: No focal liver abnormality. Multiple stones identified within the gallbladder. These measure up to 1.2 cm. No gallbladder wall thickening or biliary dilatation. Pancreas: Unremarkable. No pancreatic ductal dilatation or surrounding inflammatory changes. Spleen: Geographic area of low attenuation involving the inferior spleen is identified, new from 03/04/2017. Adrenals/Urinary Tract: The adrenal glands appear normal. Unremarkable appearance of the kidneys. The urinary bladder is within normal limits. Stomach/Bowel: Stomach is within normal limits. Appendix appears normal. No evidence of bowel wall thickening, distention, or inflammatory changes. Vascular/Lymphatic: Aortic atherosclerosis. No aneurysm. No upper abdominal or pelvic adenopathy. Reproductive: The uterus is unremarkable. Left adnexal cyst is again noted measuring 5.3 cm and 7 HU. Other: There is a large well-circumscribed  mass within the left inguinal region which extends laterally into the soft tissues overlying the lateral pelvis. The ventral component measures 3.9 x 8.4 by 9.2 cm. The more hyperdense lateral component Measures 8.7 x 4.1 by 7.5 cm. There is diffuse stranding within the surrounding soft tissues. Musculoskeletal: Previous left hip arthroplasty. Suggestion of right hip avascular necrosis, image 81 of series 8. There is degenerative disc disease noted throughout the lumbar spine. Compression fractures are noted at T11 and L5. IMPRESSION: 1. Examination is positive for large complex mass involving the ventral aspect of the right inguinal region and extending laterally to overly the right iliac bone. This is favored to represent a large acute and subacute hematoma. 2. 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 3.  Aortic Atherosclerosis (ICD10-I70.0). 4. Thoracic and lumbar compression deformities. 5. Gallstones These results were called by telephone at the time of interpretation on 04/12/2017 at 4:02 pm to Dr. Damita Dunnings , who verbally acknowledged these results. Electronically Signed   By: Kerby Moors M.D.   On: 04/12/2017 16:03  Ir Angiogram Extremity Left  Result Date: 04/08/2017 INDICATION: 83 year old female with acute thrombosis of left superficial femoral artery stent system. SFA and popliteal stent system placed 03/25/2017 for treatment of critical limb ischemia and nonhealing wound. She returns today for initiation of thrombolysis and treatment EXAM: ULTRASOUND GUIDED ACCESS RIGHT COMMON FEMORAL ARTERY ANGIOGRAM LEFT LOWER EXTREMITY INITIATION OF ARTERIAL THROMBOLYSIS WITH CATHETER DIRECTED THERAPY MEDICATIONS: None ANESTHESIA/SEDATION: Moderate (conscious) sedation was employed during this procedure. A total of Versed 2.0 mg and Fentanyl 100 mcg was administered intravenously. Moderate Sedation Time: 45 minutes. The patient's level of consciousness and vital signs were monitored continuously by radiology nursing  throughout the procedure under my direct supervision. CONTRAST:  40 cc Isovue FLUOROSCOPY TIME:  Fluoroscopy Time: 10 minutes 24 seconds (67 mGy). COMPLICATIONS: None PROCEDURE: Informed consent was obtained from the patient following explanation of the procedure, risks, benefits and alternatives. The patient understands, agrees and consents for the procedure. All questions were addressed. A time out was performed prior to the initiation of the procedure. Maximal barrier sterile technique utilized including caps, mask, sterile gowns, sterile gloves, large sterile drape, hand hygiene, and Betadine prep. Ultrasound survey of the right inguinal region was performed with images stored and sent to PACs. A micropuncture needle was used access the right common femoral artery under ultrasound. With excellent arterial blood flow returned, and an .018 micro wire was passed through the needle, observed enter the abdominal aorta under fluoroscopy. The needle was removed, and a micropuncture sheath was placed over the wire. The inner dilator and wire were removed, and an 035 Bentson wire was advanced under fluoroscopy into the abdominal aorta. The sheath was removed and a standard 5 [place] vascular sheath was placed. The dilator was removed and the sheath was flushed. Omni Flush catheter was advanced over the Bentson wire into the abdominal aorta and the Bentson wire was retracted to form the curve on the catheter. Catheter and wire were then directed down the contralateral common iliac artery. The wire was advanced to the superficial femoral artery. Catheter was removed an a 5 French vertebral catheter was placed. Angiogram of the left lower extremity was then performed. Rosen wire was then passed through the catheter into the superficial femoral artery in the catheter was removed. Sheath was removed. A 6 French 45 cm Arrow braided sheath was then placed over the wire into the distal external iliac artery. Vertebral catheter  was then placed over the Rosen wire in the [place] wire was removed. Angiogram was performed. Combination of Rosen wire and the vertebral catheter were then used to traverse the proximal 2/3 of the thrombosed superficial femoral artery. When the catheter reached the popliteal artery there was significant resistance. Vertebral catheter was removed and a Uni fuse infusion catheter, 135 cm length, 40 cm infusion length was selected. This was placed over the [facility] wire. Rosen wire was then was removed and a stiff Glidewire was then used. The tip of the Uni fuse catheter was slowly advanced into the origin of the posterior tibial artery. Glidewire was removed, limited angiogram was performed, and then the obturator wire was placed. Patient tolerated the procedure well. Patient remained hemodynamically stable throughout. No complications were encountered.  No significant blood loss FINDINGS: Angiogram demonstrates occlusion of the stent system of the distal superficial femoral artery an the popliteal artery. There is no significant reconstitution of flow into the distal tibial arteries, however, patency of the distal popliteal artery, tibioperoneal trunk, and the proximal 1/3 of the anterior  tibial artery was evident. The proximal posterior tibial artery was occluded. The anterior tibial artery is again occluded proximally, which was the case on the prior angiogram. There is critical stenosis at the origin of the peroneal artery. After placement of the infusion catheter, injection demonstrates partial filling of the posterior tibial artery. IMPRESSION: Status post ultrasound guided access of right common femoral artery and placement of a 40 cm length infusion catheter across acute thrombosis of the left superficial femoral artery and popliteal artery for initiation of thrombolysis. Signed, Dulcy Fanny. Earleen Newport, DO [facility] Electronically Signed   By: Corrie Mckusick  D.O.   On: 04/08/2017 13:41   Ir Angiogram Extremity Left  Result Date: 03/25/2017 INDICATION: 83 year old female with a history of left lower extremity critical limb ischemia and a nonhealing amputation site. Prior ankle brachial index measures in the mild range of arterial occlusive disease, with the segmental exam demonstrating evidence of a distal femoral or popliteal occlusion. CT performed 03/04/2017 confirms popliteal occlusion with questionable patency of the tibial vessels. She presents today for an angiogram and attempted treatment of the popliteal occlusion. EXAM: ULTRASOUND GUIDED ACCESS OF RIGHT COMMON FEMORAL ARTERY ANGIOGRAM LEFT LOWER EXTREMITY REVASCULARIZATION OF OCCLUDED POPLITEAL ARTERY WITH ANGIOPLASTY AND STENTING. REVASCULARIZATION OF OCCLUDED TIBIOPERONEAL ARTERY AND POSTERIOR TIBIAL ARTERY WITH BALLOON ANGIOPLASTY AND RESTORATION OF IN-LINE FLOW TO THE ANKLE MEDICATIONS: 200 MCG NITROGLYCERIN INTRA-ARTERIAL 8000 UNITS HEPARIN FOR ANTI COAGULATION 5 MG HYDRALAZINE ANESTHESIA/SEDATION: Moderate (conscious) sedation was employed during this procedure. A total of Versed 2.0 mg and Fentanyl 100 mcg was administered intravenously. Moderate Sedation Time: 180 minutes. The patient's level of consciousness and vital signs were monitored continuously by radiology nursing throughout the procedure under my direct supervision. CONTRAST:  200 cc Isovue-300 FLUOROSCOPY TIME:  Fluoroscopy Time: 38 minutes 12 seconds (104.9 mGy). COMPLICATIONS: None PROCEDURE: Informed consent was obtained from the patient following explanation of the procedure, risks, benefits and alternatives. The patient understands, agrees and consents for the procedure. All questions were addressed. A time out was performed prior to the initiation of the procedure. Maximal barrier sterile technique utilized including caps, mask, sterile gowns, sterile gloves, large sterile drape, hand hygiene, and Betadine prep. Ultrasound survey of  the right inguinal region was performed with images stored and sent to PACs. A micropuncture needle was used access the right common femoral artery under ultrasound. With excellent arterial blood flow returned, and an .018 micro wire was passed through the needle, observed enter the abdominal aorta under fluoroscopy. The needle was removed, and a micropuncture sheath was placed over the wire. The inner dilator and wire were removed, and an 035 Bentson wire was advanced under fluoroscopy into the abdominal aorta. The sheath was removed and a standard 5 [place] vascular sheath was placed. The dilator was removed and the sheath was flushed. Omni Flush catheter was advanced over the Bentson wire to the aortic bifurcation. Pelvic angiogram performed. Omni Flush catheter was then used to navigate the Bentson wire into the left external iliac artery. Omni Flush catheter was removed, and 100 cm vertebral catheter was advanced into the external iliac artery. Angiogram of the left lower extremity was performed. Rosen wire was then placed through the vertebral catheter and the 5 French short sheath was exchanged for a 6 [place] braided 65cm Arrow sheath. Sheath was advanced into the proximal superficial femoral artery. Multiple obliquities at the left knee were required in order to visualize the occlusion of the popliteal artery given the  left knee arthroplasty hardware. Once adequate angiogram was performed, the proximal occlusion was engaged with the vertebral catheter and an 035 Coons wire. The occlusion was traversed with the vertebral catheter, with the vertebral catheter in the below knee popliteal artery. Wire was removed and a luminal position was confirmed with injection. 014 BMW wire was then advanced through the vertebral catheter and the vertebral catheter was removed. Balloon angioplasty was then performed to a diameter of 4 mm along the length of the abnormal distal superficial femoral artery and the popliteal  occlusion into the below knee popliteal artery. A Supera 4 mm x 80 mm was deployed in the popliteal artery across the occluded site, with the stent structure extended proximally with a 5 mm x 80 mm stent. Repeat angiogram was performed. Balloon angioplasty was then performed along the length of the stent site with 5 mm by 60 mm. Combination of angled CXi catheter and 016 Fathom wire were then used to engage the distal tibioperoneal trunk occlusion and the posterior tibial artery occlusion. This catheter and wire combination were successful with traversing the inclusion. Once the wire was removed and a luminal position was confirmed, the BMW wire was advanced through the CXI catheter into the posterior tibial artery. Balloon angioplasty was performed with 3 mm diameter balloon. Angiogram was performed, confirming patency to the ankle. All catheters and wires were removed. Final angiogram was performed. The sheath was then withdrawn to the right external iliac artery and angiogram was performed. The braided sheath was then exchanged for a short 6 [place] sheath. Given that the ACT greater than 150 at the time of conclusion, sheath remain in place on a pressurized saline bag while heparin was metabolites. The patient tolerated the procedure well and remained hemodynamically stable throughout. No complications were encountered and no significant blood loss. FINDINGS: Ultrasound survey of the right common femoral artery demonstrates mild calcifications with patent vessel. Angiogram of the pelvic vasculature demonstrates dense calcium with mild atherosclerotic changes bilaterally. Bilateral L4 and L5 lumbar arteries are patent. Bilateral hypogastric arteries are patent with patent pelvic vasculature. No significant stenosis of the right or left external iliac arteries. Angiogram of the left lower extremity demonstrates mild disease of the proximal left superficial femoral artery with patent profunda femoris. 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 The below knee popliteal artery is patent from collateral flow. Anterior tibial artery origin is patent, occluded in the proximal third beyond the origin. Distal tibioperoneal trunk is occluded with occlusion extending into the proximal posterior tibial artery and peroneal artery. Collateral flow reconstitutes the distal peroneal artery, posterior tibial artery, and anterior tibial artery which remain patent at the ankle. There is moderate disease of the distal posterior tibial artery involving the common plantar, with the lateral plantar artery remain patent. Status post treatment of popliteal occlusion and placement of overlapping stent system proximally into the disease superficial femoral artery, there is excellent flow through the SFA and popliteal artery with no dissection. Continued flow through the proximal anterior tibial artery. Status post treatment of tibioperoneal occlusion and revascularization of the posterior tibial artery there is in-line flow restored through the posterior tibial artery to the ankle. At the conclusion of the case there is palpable posterior tibial pulse on the left. IMPRESSION: Status post left lower extremity angiogram and revascularization of chronic total occlusion of popliteal artery, distal TP trunk, and  origin of the posterior tibial artery with restoration of in-line flow to the ankle via the posterior tibial artery after stenting of the popliteal occlusion. Signed, Dulcy Fanny. Earleen Newport DO [facility] PLAN: The right common femoral artery sheath will stay until with the ACT is below 200. Plan for closure with Exoseal. Gentle hydration IV. Right leg straight until the sheath is out. After the sheath is out the right hip will stay straight for 4 hours. Plan on DC home  today. Should the patient have plateau of wound healing, the next target would be left anterior tibial artery in the angiosome, with previous discussion of tibial access and anterior tibial artery revascularization. Electronically Signed   By: Corrie Mckusick D.O.   On: 03/25/2017 17:16   Ir Angiogram Selective Each Additional Vessel  Result Date: 03/25/2017 INDICATION: 83 year old female with a history of left lower extremity critical limb ischemia and a nonhealing amputation site. Prior ankle brachial index measures in the mild range of arterial occlusive disease, with the segmental exam demonstrating evidence of a distal femoral or popliteal occlusion. CT performed 03/04/2017 confirms popliteal occlusion with questionable patency of the tibial vessels. She presents today for an angiogram and attempted treatment of the popliteal occlusion. EXAM: ULTRASOUND GUIDED ACCESS OF RIGHT COMMON FEMORAL ARTERY ANGIOGRAM LEFT LOWER EXTREMITY REVASCULARIZATION OF OCCLUDED POPLITEAL ARTERY WITH ANGIOPLASTY AND STENTING. REVASCULARIZATION OF OCCLUDED TIBIOPERONEAL ARTERY AND POSTERIOR TIBIAL ARTERY WITH BALLOON ANGIOPLASTY AND RESTORATION OF IN-LINE FLOW TO THE ANKLE MEDICATIONS: 200 MCG NITROGLYCERIN INTRA-ARTERIAL 8000 UNITS HEPARIN FOR ANTI COAGULATION 5 MG HYDRALAZINE ANESTHESIA/SEDATION: Moderate (conscious) sedation was employed during this procedure. A total of Versed 2.0 mg and Fentanyl 100 mcg was administered intravenously. Moderate Sedation Time: 180 minutes. The patient's level of consciousness and vital signs were monitored continuously by radiology nursing throughout the procedure under my direct supervision. CONTRAST:  200 cc Isovue-300 FLUOROSCOPY TIME:  Fluoroscopy Time: 38 minutes 12 seconds (104.9 mGy). COMPLICATIONS: None PROCEDURE: Informed consent was obtained from the patient following explanation of the procedure, risks, benefits and alternatives. The patient understands, agrees and consents for the  procedure. All questions were addressed. A time out was performed prior to the initiation of the procedure. Maximal barrier sterile technique utilized including caps, mask, sterile gowns, sterile gloves, large sterile drape, hand hygiene, and Betadine prep. Ultrasound survey of the right inguinal region was performed with images stored and sent to PACs. A micropuncture needle was used access the right common femoral artery under ultrasound. With excellent arterial blood flow returned, and an .018 micro wire was passed through the needle, observed enter the abdominal aorta under fluoroscopy. The needle was removed, and a micropuncture sheath was placed over the wire. The inner dilator and wire were removed, and an 035 Bentson wire was advanced under fluoroscopy into the abdominal aorta. The sheath was removed and a standard 5 [place] vascular sheath was placed. The dilator was removed and the sheath was flushed. Omni Flush catheter was advanced over the Bentson wire to the aortic bifurcation. Pelvic angiogram performed. Omni Flush catheter was then used to navigate the Bentson wire into the left external iliac artery. Omni Flush catheter was removed, and 100 cm vertebral catheter was advanced into the external iliac artery. Angiogram of the left lower extremity was performed. Rosen wire was then placed through the vertebral catheter and the 5 French short sheath was exchanged for a 6 [place] braided 65cm Arrow sheath. Sheath was advanced into the proximal superficial femoral artery.  Multiple obliquities at the left knee were required in order to visualize the occlusion of the popliteal artery given the left knee arthroplasty hardware. Once adequate angiogram was performed, the proximal occlusion was engaged with the vertebral catheter and an 035 Coons wire. The occlusion was traversed with the vertebral catheter, with the vertebral catheter in the below knee popliteal artery. Wire was removed and a luminal position was  confirmed with injection. 014 BMW wire was then advanced through the vertebral catheter and the vertebral catheter was removed. Balloon angioplasty was then performed to a diameter of 4 mm along the length of the abnormal distal superficial femoral artery and the popliteal occlusion into the below knee popliteal artery. A Supera 4 mm x 80 mm was deployed in the popliteal artery across the occluded site, with the stent structure extended proximally with a 5 mm x 80 mm stent. Repeat angiogram was performed. Balloon angioplasty was then performed along the length of the stent site with 5 mm by 60 mm. Combination of angled CXi catheter and 016 Fathom wire were then used to engage the distal tibioperoneal trunk occlusion and the posterior tibial artery occlusion. This catheter and wire combination were successful with traversing the inclusion. Once the wire was removed and a luminal position was confirmed, the BMW wire was advanced through the CXI catheter into the posterior tibial artery. Balloon angioplasty was performed with 3 mm diameter balloon. Angiogram was performed, confirming patency to the ankle. All catheters and wires were removed. Final angiogram was performed. The sheath was then withdrawn to the right external iliac artery and angiogram was performed. The braided sheath was then exchanged for a short 6 [place] sheath. Given that the ACT greater than 150 at the time of conclusion, sheath remain in place on a pressurized saline bag while heparin was metabolites. The patient tolerated the procedure well and remained hemodynamically stable throughout. No complications were encountered and no significant blood loss. FINDINGS: Ultrasound survey of the right common femoral artery demonstrates mild calcifications with patent vessel. Angiogram of the pelvic vasculature demonstrates dense calcium with mild atherosclerotic changes bilaterally. Bilateral L4 and L5 lumbar arteries are patent. Bilateral hypogastric  arteries are patent with patent pelvic vasculature. No significant stenosis of the right or left external iliac arteries. Angiogram of the left lower extremity demonstrates mild disease of the proximal left superficial femoral artery with patent profunda femoris. 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 The below knee popliteal artery is patent from collateral flow. Anterior tibial artery origin is patent, occluded in the proximal third beyond the origin. Distal tibioperoneal trunk is occluded with occlusion extending into the proximal posterior tibial artery and peroneal artery. Collateral flow reconstitutes the distal peroneal artery, posterior tibial artery, and anterior tibial artery which remain patent at the ankle. There is moderate disease of the distal posterior tibial artery involving the common plantar, with the lateral plantar artery remain patent. Status post treatment of popliteal occlusion and placement of overlapping stent system proximally into the disease superficial femoral artery, there is excellent flow through the SFA and popliteal artery with no dissection. Continued flow through the proximal anterior tibial artery. Status post treatment of tibioperoneal occlusion and revascularization of the posterior tibial artery there is in-line flow restored through the posterior tibial artery to the ankle. At the conclusion of the case there is palpable posterior tibial pulse on the left.  IMPRESSION: Status post left lower extremity angiogram and revascularization of chronic total occlusion of popliteal artery, distal TP trunk, and origin of the posterior tibial artery with restoration of in-line flow to the ankle via the posterior tibial artery after stenting of the popliteal occlusion. Signed, Dulcy Fanny. Earleen Newport DO [facility]  Radiology PLAN: The right common femoral artery sheath will stay until with the ACT is below 200. Plan for closure with Exoseal. Gentle hydration IV. Right leg straight until the sheath is out. After the sheath is out the right hip will stay straight for 4 hours. Plan on DC home today. Should the patient have plateau of wound healing, the next target would be left anterior tibial artery in the angiosome, with previous discussion of tibial access and anterior tibial artery revascularization. Electronically Signed   By: Corrie Mckusick D.O.   On: 03/25/2017 17:16   Ir Fem Pop Art Stent Inc Pta Mod Sed  Result Date: 03/25/2017 INDICATION: 83 year old female with a history of left lower extremity critical limb ischemia and a nonhealing amputation site. Prior ankle brachial index measures in the mild range of arterial occlusive disease, with the segmental exam demonstrating evidence of a distal femoral or popliteal occlusion. CT performed 03/04/2017 confirms popliteal occlusion with questionable patency of the tibial vessels. She presents today for an angiogram and attempted treatment of the popliteal occlusion. EXAM: ULTRASOUND GUIDED ACCESS OF RIGHT COMMON FEMORAL ARTERY ANGIOGRAM LEFT LOWER EXTREMITY REVASCULARIZATION OF OCCLUDED POPLITEAL ARTERY WITH ANGIOPLASTY AND STENTING. REVASCULARIZATION OF OCCLUDED TIBIOPERONEAL ARTERY AND POSTERIOR TIBIAL ARTERY WITH BALLOON ANGIOPLASTY AND RESTORATION OF IN-LINE FLOW TO THE ANKLE MEDICATIONS: 200 MCG NITROGLYCERIN INTRA-ARTERIAL 8000 UNITS HEPARIN FOR ANTI COAGULATION 5 MG HYDRALAZINE ANESTHESIA/SEDATION: Moderate (conscious) sedation was employed during this procedure. A total of Versed 2.0 mg and Fentanyl 100 mcg was administered intravenously. Moderate Sedation Time: 180 minutes. The patient's level of consciousness and vital signs were monitored continuously by radiology nursing throughout the procedure under my direct supervision. CONTRAST:  200 cc Isovue-300  FLUOROSCOPY TIME:  Fluoroscopy Time: 38 minutes 12 seconds (104.9 mGy). COMPLICATIONS: None PROCEDURE: Informed consent was obtained from the patient following explanation of the procedure, risks, benefits and alternatives. The patient understands, agrees and consents for the procedure. All questions were addressed. A time out was performed prior to the initiation of the procedure. Maximal barrier sterile technique utilized including caps, mask, sterile gowns, sterile gloves, large sterile drape, hand hygiene, and Betadine prep. Ultrasound survey of the right inguinal region was performed with images stored and sent to PACs. A micropuncture needle was used access the right common femoral artery under ultrasound. With excellent arterial blood flow returned, and an .018 micro wire was passed through the needle, observed enter the abdominal aorta under fluoroscopy. The needle was removed, and a micropuncture sheath was placed over the wire. The inner dilator and wire were removed, and an 035 Bentson wire was advanced under fluoroscopy into the abdominal aorta. The sheath was removed and a standard 5 [place] vascular sheath was placed. The dilator was removed and the sheath was flushed. Omni Flush catheter was advanced over the Bentson wire to the aortic bifurcation. Pelvic angiogram performed. Omni Flush catheter was then used to navigate the Bentson wire into the left external iliac artery. Omni Flush catheter was removed, and 100 cm vertebral catheter was advanced into the external iliac artery. Angiogram of the left lower extremity was performed. Rosen wire was then placed through the vertebral catheter and the  5 French short sheath was exchanged for a 6 [place] braided 65cm Arrow sheath. Sheath was advanced into the proximal superficial femoral artery. Multiple obliquities at the left knee were required in order to visualize the occlusion of the popliteal artery given the left knee arthroplasty hardware. Once  adequate angiogram was performed, the proximal occlusion was engaged with the vertebral catheter and an 035 [place] wire. The occlusion was traversed with the vertebral catheter, with the vertebral catheter in the below knee popliteal artery. Wire was removed and a luminal position was confirmed with injection. 014 BMW wire was then advanced through the vertebral catheter and the vertebral catheter was removed. Balloon angioplasty was then performed to a diameter of 4 mm along the length of the abnormal distal superficial femoral artery and the popliteal occlusion into the below knee popliteal artery. A Supera 4 mm x 80 mm was deployed in the popliteal artery across the occluded site, with the stent structure extended proximally with a 5 mm x 80 mm stent. Repeat angiogram was performed. Balloon angioplasty was then performed along the length of the stent site with 5 mm by 60 mm. Combination of angled CXi catheter and 016 Fathom wire were then used to engage the distal tibioperoneal trunk occlusion and the posterior tibial artery occlusion. This catheter and wire combination were successful with traversing the inclusion. Once the wire was removed and a luminal position was confirmed, the BMW wire was advanced through the CXI catheter into the posterior tibial artery. Balloon angioplasty was performed with 3 mm diameter balloon. Angiogram was performed, confirming patency to the ankle. All catheters and wires were removed. Final angiogram was performed. The sheath was then withdrawn to the right external iliac artery and angiogram was performed. The braided sheath was then exchanged for a short 6 [place] sheath. Given that the ACT greater than 150 at the time of conclusion, sheath remain in place on a pressurized saline bag while heparin was metabolites. The patient tolerated the procedure well and remained hemodynamically stable throughout. No complications were encountered and no significant blood loss. FINDINGS:  Ultrasound survey of the right common femoral artery demonstrates mild calcifications with patent vessel. Angiogram of the pelvic vasculature demonstrates dense calcium with mild atherosclerotic changes bilaterally. Bilateral L4 and L5 lumbar arteries are patent. Bilateral hypogastric arteries are patent with patent pelvic vasculature. No significant stenosis of the right or left external iliac arteries. Angiogram of the left lower extremity demonstrates mild disease of the proximal left superficial femoral artery with patent profunda femoris. 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 The below knee popliteal artery is patent from collateral flow. Anterior tibial artery origin is patent, occluded in the proximal third beyond the origin. Distal tibioperoneal trunk is occluded with occlusion extending into the proximal posterior tibial artery and peroneal artery. Collateral flow reconstitutes the distal peroneal artery, posterior tibial artery, and anterior tibial artery which remain patent at the ankle. There is moderate disease of the distal posterior tibial artery involving the common plantar, with the lateral plantar artery remain patent. Status post treatment of popliteal occlusion and placement of overlapping stent system proximally into the disease superficial femoral artery, there is excellent flow through the SFA and popliteal artery with no dissection. Continued flow through the proximal anterior tibial artery. Status post treatment of tibioperoneal occlusion and revascularization of the posterior tibial artery there is in-line flow restored  through the posterior tibial artery to the ankle. At the conclusion of the case there is palpable posterior tibial pulse on the left. IMPRESSION: Status post left lower extremity angiogram and revascularization of chronic total occlusion of  popliteal artery, distal TP trunk, and origin of the posterior tibial artery with restoration of in-line flow to the ankle via the posterior tibial artery after stenting of the popliteal occlusion. Signed, Dulcy Fanny. Earleen Newport DO [facility] Radiology PLAN: The right common femoral artery sheath will stay until with the ACT is below 200. Plan for closure with Exoseal. Gentle hydration IV. Right leg straight until the sheath is out. After the sheath is out the right hip will stay straight for 4 hours. Plan on DC home today. Should the patient have plateau of wound healing, the next target would be left anterior tibial artery in the angiosome, with previous discussion of tibial access and anterior tibial artery revascularization. Electronically Signed   By: Corrie Mckusick D.O.   On: 03/25/2017 17:16   Ir Tib-pero Art Pta Mod Sed  Result Date: 03/25/2017 INDICATION: 83 year old female with a history of left lower extremity critical limb ischemia and a nonhealing amputation site. Prior ankle brachial index measures in the mild range of arterial occlusive disease, with the segmental exam demonstrating evidence of a distal femoral or popliteal occlusion. CT performed 03/04/2017 confirms popliteal occlusion with questionable patency of the tibial vessels. She presents today for an angiogram and attempted treatment of the popliteal occlusion. EXAM: ULTRASOUND GUIDED ACCESS OF RIGHT COMMON FEMORAL ARTERY ANGIOGRAM LEFT LOWER EXTREMITY REVASCULARIZATION OF OCCLUDED POPLITEAL ARTERY WITH ANGIOPLASTY AND STENTING. REVASCULARIZATION OF OCCLUDED TIBIOPERONEAL ARTERY AND POSTERIOR TIBIAL ARTERY WITH BALLOON ANGIOPLASTY AND RESTORATION OF IN-LINE FLOW TO THE ANKLE MEDICATIONS: 200 MCG NITROGLYCERIN INTRA-ARTERIAL 8000 UNITS HEPARIN FOR ANTI COAGULATION 5 MG HYDRALAZINE ANESTHESIA/SEDATION: Moderate (conscious) sedation was employed during this procedure. A total of Versed 2.0 mg and  Fentanyl 100 mcg was administered intravenously. Moderate Sedation Time: 180 minutes. The patient's level of consciousness and vital signs were monitored continuously by radiology nursing throughout the procedure under my direct supervision. CONTRAST:  200 cc Isovue-300 FLUOROSCOPY TIME:  Fluoroscopy Time: 38 minutes 12 seconds (104.9 mGy). COMPLICATIONS: None PROCEDURE: Informed consent was obtained from the patient following explanation of the procedure, risks, benefits and alternatives. The patient understands, agrees and consents for the procedure. All questions were addressed. A time out was performed prior to the initiation of the procedure. Maximal barrier sterile technique utilized including caps, mask, sterile gowns, sterile gloves, large sterile drape, hand hygiene, and Betadine prep. Ultrasound survey of the right inguinal region was performed with images stored and sent to PACs. A micropuncture needle was used access the right common femoral artery under ultrasound. With excellent arterial blood flow returned, and an .018 micro wire was passed through the needle, observed enter the abdominal aorta under fluoroscopy. The needle was removed, and a micropuncture sheath was placed over the wire. The inner dilator and wire were removed, and an 035 Bentson wire was advanced under fluoroscopy into the abdominal aorta. The sheath was removed and a standard 5 [place] vascular sheath was placed. The dilator was removed and the sheath was flushed. Omni Flush catheter was advanced over the Bentson wire to the aortic bifurcation. Pelvic angiogram performed. Omni Flush catheter was then used to navigate the Bentson wire into the left external iliac artery. Omni Flush catheter was removed, and 100 cm vertebral catheter was advanced into the external iliac  artery. Angiogram of the left lower extremity was performed. Rosen wire was then placed through the vertebral catheter and the 5 French short sheath was exchanged for  a 6 [place] braided 65cm Arrow sheath. Sheath was advanced into the proximal superficial femoral artery. Multiple obliquities at the left knee were required in order to visualize the occlusion of the popliteal artery given the left knee arthroplasty hardware. Once adequate angiogram was performed, the proximal occlusion was engaged with the vertebral catheter and an 035 Coons wire. The occlusion was traversed with the vertebral catheter, with the vertebral catheter in the below knee popliteal artery. Wire was removed and a luminal position was confirmed with injection. 014 BMW wire was then advanced through the vertebral catheter and the vertebral catheter was removed. Balloon angioplasty was then performed to a diameter of 4 mm along the length of the abnormal distal superficial femoral artery and the popliteal occlusion into the below knee popliteal artery. A Supera 4 mm x 80 mm was deployed in the popliteal artery across the occluded site, with the stent structure extended proximally with a 5 mm x 80 mm stent. Repeat angiogram was performed. Balloon angioplasty was then performed along the length of the stent site with 5 mm by 60 mm. Combination of angled CXi catheter and 016 Fathom wire were then used to engage the distal tibioperoneal trunk occlusion and the posterior tibial artery occlusion. This catheter and wire combination were successful with traversing the inclusion. Once the wire was removed and a luminal position was confirmed, the BMW wire was advanced through the CXI catheter into the posterior tibial artery. Balloon angioplasty was performed with 3 mm diameter balloon. Angiogram was performed, confirming patency to the ankle. All catheters and wires were removed. Final angiogram was performed. The sheath was then withdrawn to the right external iliac artery and angiogram was performed. The braided sheath was then exchanged for a short 6 [place] sheath. Given that the ACT greater than 150 at the time of  conclusion, sheath remain in place on a pressurized saline bag while heparin was metabolites. The patient tolerated the procedure well and remained hemodynamically stable throughout. No complications were encountered and no significant blood loss. FINDINGS: Ultrasound survey of the right common femoral artery demonstrates mild calcifications with patent vessel. Angiogram of the pelvic vasculature demonstrates dense calcium with mild atherosclerotic changes bilaterally. Bilateral L4 and L5 lumbar arteries are patent. Bilateral hypogastric arteries are patent with patent pelvic vasculature. No significant stenosis of the right or left external iliac arteries. Angiogram of the left lower extremity demonstrates mild disease of the proximal left superficial femoral artery with patent profunda femoris. 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 The below knee popliteal artery is patent from collateral flow. Anterior tibial artery origin is patent, occluded in the proximal third beyond the origin. Distal tibioperoneal trunk is occluded with occlusion extending into the proximal posterior tibial artery and peroneal artery. Collateral flow reconstitutes the distal peroneal artery, posterior tibial artery, and anterior tibial artery which remain patent at the ankle. There is moderate disease of the distal posterior tibial artery involving the common plantar, with the lateral plantar artery remain patent. Status post treatment of popliteal occlusion and placement of overlapping stent system proximally into the disease superficial femoral artery, there is excellent flow through the SFA and popliteal artery with no dissection. Continued flow through the proximal anterior  tibial artery. Status post treatment of tibioperoneal occlusion and revascularization of the posterior tibial artery there is in-line flow  restored through the posterior tibial artery to the ankle. At the conclusion of the case there is palpable posterior tibial pulse on the left. IMPRESSION: Status post left lower extremity angiogram and revascularization of chronic total occlusion of popliteal artery, distal TP trunk, and origin of the posterior tibial artery with restoration of in-line flow to the ankle via the posterior tibial artery after stenting of the popliteal occlusion. Signed, Dulcy Fanny. Earleen Newport DO [facility] Radiology PLAN: The right common femoral artery sheath will stay until with the ACT is below 200. Plan for closure with Exoseal. Gentle hydration IV. Right leg straight until the sheath is out. After the sheath is out the right hip will stay straight for 4 hours. Plan on DC home today. Should the patient have plateau of wound healing, the next target would be left anterior tibial artery in the angiosome, with previous discussion of tibial access and anterior tibial artery revascularization. Electronically Signed   By: Corrie Mckusick D.O.   On: 03/25/2017 17:16   Ir US Guide Vasc Access Right  Result Date: 04/08/2017 INDICATION: 83 year old female with acute thrombosis of left superficial femoral artery stent system. SFA and popliteal stent system placed 03/25/2017 for treatment of critical limb ischemia and nonhealing wound. She returns today for initiation of thrombolysis and treatment EXAM: ULTRASOUND GUIDED ACCESS RIGHT COMMON FEMORAL ARTERY ANGIOGRAM LEFT LOWER EXTREMITY INITIATION OF ARTERIAL THROMBOLYSIS WITH CATHETER DIRECTED THERAPY MEDICATIONS: None ANESTHESIA/SEDATION: Moderate (conscious) sedation was employed during this procedure. A total of Versed 2.0 mg and Fentanyl 100 mcg was administered intravenously. Moderate Sedation Time: 45 minutes. The patient's level of consciousness and vital signs were monitored continuously by radiology nursing throughout the procedure under  my direct supervision. CONTRAST:  40 cc Isovue FLUOROSCOPY TIME:  Fluoroscopy Time: 10 minutes 24 seconds (67 mGy). COMPLICATIONS: None PROCEDURE: Informed consent was obtained from the patient following explanation of the procedure, risks, benefits and alternatives. The patient understands, agrees and consents for the procedure. All questions were addressed. A time out was performed prior to the initiation of the procedure. Maximal barrier sterile technique utilized including caps, mask, sterile gowns, sterile gloves, large sterile drape, hand hygiene, and Betadine prep. Ultrasound survey of the right inguinal region was performed with images stored and sent to PACs. A micropuncture needle was used access the right common femoral artery under ultrasound. With excellent arterial blood flow returned, and an .018 micro wire was passed through the needle, observed enter the abdominal aorta under fluoroscopy. The needle was removed, and a micropuncture sheath was placed over the wire. The inner dilator and wire were removed, and an 035 Bentson wire was advanced under fluoroscopy into the abdominal aorta. The sheath was removed and a standard 5 [place] vascular sheath was placed. The dilator was removed and the sheath was flushed. Omni Flush catheter was advanced over the Bentson wire into the abdominal aorta and the Bentson wire was retracted to form the curve on the catheter. Catheter and wire were then directed down the contralateral common iliac artery. The wire was advanced to the superficial femoral artery. Catheter was removed an a 5 French vertebral catheter was placed. Angiogram of the left lower extremity was then performed. Rosen wire was then passed through the catheter into the superficial femoral artery in the catheter was removed. Sheath was removed. A 6 French 45 cm Arrow braided  sheath was then placed over the wire into the distal external iliac artery. Vertebral catheter was then placed over the Rosen  wire in the [place] wire was removed. Angiogram was performed. Combination of Rosen wire and the vertebral catheter were then used to traverse the proximal 2/3 of the thrombosed superficial femoral artery. When the catheter reached the popliteal artery there was significant resistance. Vertebral catheter was removed and a Uni fuse infusion catheter, 135 cm length, 40 cm infusion length was selected. This was placed over the [facility] wire. Rosen wire was then was removed and a stiff Glidewire was then used. The tip of the Uni fuse catheter was slowly advanced into the origin of the posterior tibial artery. Glidewire was removed, limited angiogram was performed, and then the obturator wire was placed. Patient tolerated the procedure well. Patient remained hemodynamically stable throughout. No complications were encountered.  No significant blood loss FINDINGS: Angiogram demonstrates occlusion of the stent system of the distal superficial femoral artery an the popliteal artery. There is no significant reconstitution of flow into the distal tibial arteries, however, patency of the distal popliteal artery, tibioperoneal trunk, and the proximal 1/3 of the anterior tibial artery was evident. The proximal posterior tibial artery was occluded. The anterior tibial artery is again occluded proximally, which was the case on the prior angiogram. There is critical stenosis at the origin of the peroneal artery. After placement of the infusion catheter, injection demonstrates partial filling of the posterior tibial artery. IMPRESSION: Status post ultrasound guided access of right common femoral artery and placement of a 40 cm length infusion catheter across acute thrombosis of the left superficial femoral artery and popliteal artery for initiation of thrombolysis. Signed, Dulcy Fanny. Earleen Newport, DO [facility] Radiology Electronically Signed   By: Corrie Mckusick D.O.   On: 04/08/2017 13:41   Ir  US Guide Vasc Access Right  Result Date: 03/25/2017 INDICATION: 83 year old female with a history of left lower extremity critical limb ischemia and a nonhealing amputation site. Prior ankle brachial index measures in the mild range of arterial occlusive disease, with the segmental exam demonstrating evidence of a distal femoral or popliteal occlusion. CT performed 03/04/2017 confirms popliteal occlusion with questionable patency of the tibial vessels. She presents today for an angiogram and attempted treatment of the popliteal occlusion. EXAM: ULTRASOUND GUIDED ACCESS OF RIGHT COMMON FEMORAL ARTERY ANGIOGRAM LEFT LOWER EXTREMITY REVASCULARIZATION OF OCCLUDED POPLITEAL ARTERY WITH ANGIOPLASTY AND STENTING. REVASCULARIZATION OF OCCLUDED TIBIOPERONEAL ARTERY AND POSTERIOR TIBIAL ARTERY WITH BALLOON ANGIOPLASTY AND RESTORATION OF IN-LINE FLOW TO THE ANKLE MEDICATIONS: 200 MCG NITROGLYCERIN INTRA-ARTERIAL 8000 UNITS HEPARIN FOR ANTI COAGULATION 5 MG HYDRALAZINE ANESTHESIA/SEDATION: Moderate (conscious) sedation was employed during this procedure. A total of Versed 2.0 mg and Fentanyl 100 mcg was administered intravenously. Moderate Sedation Time: 180 minutes. The patient's level of consciousness and vital signs were monitored continuously by radiology nursing throughout the procedure under my direct supervision. CONTRAST:  200 cc Isovue-300 FLUOROSCOPY TIME:  Fluoroscopy Time: 38 minutes 12 seconds (104.9 mGy). COMPLICATIONS: None PROCEDURE: Informed consent was obtained from the patient following explanation of the procedure, risks, benefits and alternatives. The patient understands, agrees and consents for the procedure. All questions were addressed. A time out was performed prior to the initiation of the procedure. Maximal barrier sterile technique utilized including caps, mask, sterile gowns, sterile gloves, large sterile drape, hand hygiene, and Betadine prep. Ultrasound survey of the right inguinal region was  performed with images stored and sent to PACs.  A micropuncture needle was used access the right common femoral artery under ultrasound. With excellent arterial blood flow returned, and an .018 micro wire was passed through the needle, observed enter the abdominal aorta under fluoroscopy. The needle was removed, and a micropuncture sheath was placed over the wire. The inner dilator and wire were removed, and an 035 Bentson wire was advanced under fluoroscopy into the abdominal aorta. The sheath was removed and a standard 5 [place] vascular sheath was placed. The dilator was removed and the sheath was flushed. Omni Flush catheter was advanced over the Bentson wire to the aortic bifurcation. Pelvic angiogram performed. Omni Flush catheter was then used to navigate the Bentson wire into the left external iliac artery. Omni Flush catheter was removed, and 100 cm vertebral catheter was advanced into the external iliac artery. Angiogram of the left lower extremity was performed. Rosen wire was then placed through the vertebral catheter and the 5 French short sheath was exchanged for a 6 [place] braided 65cm Arrow sheath. Sheath was advanced into the proximal superficial femoral artery. Multiple obliquities at the left knee were required in order to visualize the occlusion of the popliteal artery given the left knee arthroplasty hardware. Once adequate angiogram was performed, the proximal occlusion was engaged with the vertebral catheter and an 035 [place] wire. The occlusion was traversed with the vertebral catheter, with the vertebral catheter in the below knee popliteal artery. Wire was removed and a luminal position was confirmed with injection. 014 BMW wire was then advanced through the vertebral catheter and the vertebral catheter was removed. Balloon angioplasty was then performed to a diameter of 4 mm along the length of the abnormal distal superficial femoral artery and the popliteal occlusion into the below knee  popliteal artery. A Supera 4 mm x 80 mm was deployed in the popliteal artery across the occluded site, with the stent structure extended proximally with a 5 mm x 80 mm stent. Repeat angiogram was performed. Balloon angioplasty was then performed along the length of the stent site with 5 mm by 60 mm. Combination of angled CXi catheter and 016 Fathom wire were then used to engage the distal tibioperoneal trunk occlusion and the posterior tibial artery occlusion. This catheter and wire combination were successful with traversing the inclusion. Once the wire was removed and a luminal position was confirmed, the BMW wire was advanced through the CXI catheter into the posterior tibial artery. Balloon angioplasty was performed with 3 mm diameter balloon. Angiogram was performed, confirming patency to the ankle. All catheters and wires were removed. Final angiogram was performed. The sheath was then withdrawn to the right external iliac artery and angiogram was performed. The braided sheath was then exchanged for a short 6 [place] sheath. Given that the ACT greater than 150 at the time of conclusion, sheath remain in place on a pressurized saline bag while heparin was metabolites. The patient tolerated the procedure well and remained hemodynamically stable throughout. No complications were encountered and no significant blood loss. FINDINGS: Ultrasound survey of the right common femoral artery demonstrates mild calcifications with patent vessel. Angiogram of the pelvic vasculature demonstrates dense calcium with mild atherosclerotic changes bilaterally. Bilateral L4 and L5 lumbar arteries are patent. Bilateral hypogastric arteries are patent with patent pelvic vasculature. No significant stenosis of the right or left external iliac arteries. Angiogram of the left lower extremity demonstrates mild disease of the proximal left superficial femoral artery with patent profunda femoris. 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 The below knee popliteal artery is patent from collateral flow. Anterior tibial artery origin is patent, occluded in the proximal third beyond the origin. Distal tibioperoneal trunk is occluded with occlusion extending into the proximal posterior tibial artery and peroneal artery. Collateral flow reconstitutes the distal peroneal artery, posterior tibial artery, and anterior tibial artery which remain patent at the ankle. There is moderate disease of the distal posterior tibial artery involving the common plantar, with the lateral plantar artery remain patent. Status post treatment of popliteal occlusion and placement of overlapping stent system proximally into the disease superficial femoral artery, there is excellent flow through the SFA and popliteal artery with no dissection. Continued flow through the proximal anterior tibial artery. Status post treatment of tibioperoneal occlusion and revascularization of the posterior tibial artery there is in-line flow restored through the posterior tibial artery to the ankle. At the conclusion of the case there is palpable posterior tibial pulse on the left. IMPRESSION: Status post left lower extremity angiogram and revascularization of chronic total occlusion of popliteal artery, distal TP trunk, and origin of the posterior tibial artery with restoration of in-line flow to the ankle via the posterior tibial artery after stenting of the popliteal occlusion. Signed, Dulcy Fanny. Earleen Newport DO [facility] Radiology PLAN: The right common femoral artery sheath will stay until with the ACT is below 200. Plan for closure with Exoseal. Gentle hydration IV. Right leg straight until the sheath is out. After the sheath is out the right hip will stay straight for 4 hours. Plan on DC home today. Should the patient have  plateau of wound healing, the next target would be left anterior tibial artery in the angiosome, with previous discussion of tibial access and anterior tibial artery revascularization. Electronically Signed   By: Corrie Mckusick D.O.   On: 03/25/2017 17:16   Ir Infusion Thrombol Arterial Initial (ms)  Result Date: 04/08/2017 INDICATION: 83 year old female with acute thrombosis of left superficial femoral artery stent system. SFA and popliteal stent system placed 03/25/2017 for treatment of critical limb ischemia and nonhealing wound. She returns today for initiation of thrombolysis and treatment EXAM: ULTRASOUND GUIDED ACCESS RIGHT COMMON FEMORAL ARTERY ANGIOGRAM LEFT LOWER EXTREMITY INITIATION OF ARTERIAL THROMBOLYSIS WITH CATHETER DIRECTED THERAPY MEDICATIONS: None ANESTHESIA/SEDATION: Moderate (conscious) sedation was employed during this procedure. A total of Versed 2.0 mg and Fentanyl 100 mcg was administered intravenously. Moderate Sedation Time: 45 minutes. The patient's level of consciousness and vital signs were monitored continuously by radiology nursing throughout the procedure under my direct supervision. CONTRAST:  40 cc Isovue FLUOROSCOPY TIME:  Fluoroscopy Time: 10 minutes 24 seconds (67 mGy). COMPLICATIONS: None PROCEDURE: Informed consent was obtained from the patient following explanation of the procedure, risks, benefits and alternatives. The patient understands, agrees and consents for the procedure. All questions were addressed. A time out was performed prior to the initiation of the procedure. Maximal barrier sterile technique utilized including caps, mask, sterile gowns, sterile gloves, large sterile drape, hand hygiene, and Betadine prep. Ultrasound survey of the right inguinal region was performed with images stored and sent to PACs. A micropuncture needle was used access the right common femoral artery under ultrasound. With excellent arterial blood flow returned, and an .018 micro wire  was passed through the needle, observed enter the abdominal aorta under fluoroscopy. The needle was removed, and a micropuncture sheath was placed  over the wire. The inner dilator and wire were removed, and an 035 Bentson wire was advanced under fluoroscopy into the abdominal aorta. The sheath was removed and a standard 5 [place] vascular sheath was placed. The dilator was removed and the sheath was flushed. Omni Flush catheter was advanced over the Bentson wire into the abdominal aorta and the Bentson wire was retracted to form the curve on the catheter. Catheter and wire were then directed down the contralateral common iliac artery. The wire was advanced to the superficial femoral artery. Catheter was removed an a 5 French vertebral catheter was placed. Angiogram of the left lower extremity was then performed. Rosen wire was then passed through the catheter into the superficial femoral artery in the catheter was removed. Sheath was removed. A 6 French 45 cm Arrow braided sheath was then placed over the wire into the distal external iliac artery. Vertebral catheter was then placed over the Rosen wire in the [place] wire was removed. Angiogram was performed. Combination of Rosen wire and the vertebral catheter were then used to traverse the proximal 2/3 of the thrombosed superficial femoral artery. When the catheter reached the popliteal artery there was significant resistance. Vertebral catheter was removed and a Uni fuse infusion catheter, 135 cm length, 40 cm infusion length was selected. This was placed over the [facility] wire. Rosen wire was then was removed and a stiff Glidewire was then used. The tip of the Uni fuse catheter was slowly advanced into the origin of the posterior tibial artery. Glidewire was removed, limited angiogram was performed, and then the obturator wire was placed. Patient tolerated the procedure well. Patient remained hemodynamically stable throughout. No complications were encountered.  No  significant blood loss FINDINGS: Angiogram demonstrates occlusion of the stent system of the distal superficial femoral artery an the popliteal artery. There is no significant reconstitution of flow into the distal tibial arteries, however, patency of the distal popliteal artery, tibioperoneal trunk, and the proximal 1/3 of the anterior tibial artery was evident. The proximal posterior tibial artery was occluded. The anterior tibial artery is again occluded proximally, which was the case on the prior angiogram. There is critical stenosis at the origin of the peroneal artery. After placement of the infusion catheter, injection demonstrates partial filling of the posterior tibial artery. IMPRESSION: Status post ultrasound guided access of right common femoral artery and placement of a 40 cm length infusion catheter across acute thrombosis of the left superficial femoral artery and popliteal artery for initiation of thrombolysis. Signed, Dulcy Fanny. Earleen Newport, DO [facility] Radiology Electronically Signed   By: Corrie Mckusick D.O.   On: 04/08/2017 13:41   Ir Jacolyn Reedy F/u Eval Art/ven Final Day (ms)  Result Date: 04/09/2017 INDICATION: Chronic left limb ischemia, status post revascularization of chronic total occlusion of popliteal artery through proximal posterior tibial artery 03/25/2017. Patient presented with distal occlusion, and is status post overnight catheter directed thrombolytic infusion. EXAM: LEFT LOWER EXTREMITY ARTERIOGRAM THROUGH EXISTING CATHETER MEDICATIONS: None required ANESTHESIA/SEDATION: None required CONTRAST:  60 mL Isovue-300 FLUOROSCOPY TIME:  3 minutes, 57 mGy COMPLICATIONS: None immediate. PROCEDURE: Informed consent was obtained from the patient following explanation of the procedure, risks, benefits and alternatives. The patient understands, agrees and consents for the procedure. All questions were addressed. A time out was performed prior to  the initiation of the procedure. Contrast was injected through previously placed the right femoral sheath for left lower extremity arteriography. At the conclusion of the procedure, the infusion  catheter was removed. The patient tolerated the procedure well. FINDINGS: Patent left profunda femoris. Atheromatous SFA with tandem areas of mild to moderate stenosis in its mid portion. Clearance of thrombus from the popliteal arterial stent. No residual thrombus or stenosis. Origin occlusion of the anterior tibial artery. Short-segment moderate stenosis just beyond the origin of the tibioperoneal trunk. Patent posterior tibial artery to just below the distal calf above the ankle as before ; no direct outflow to the plantar arch. Short-segment origin stenosis of the peroneal artery of at least moderate severity, patent distally with a prominent anterior communicating branch crossing the ankle. Reconstitution of the distal anterior tibial artery the mid calf level, patent across the ankle as dorsalis pedis. IMPRESSION: 1. Interval complete clearance of acute left lower extremity arterial thrombus after overnight catheter directed thrombolytic infusion. 2. Restoration of inline flow through the posterior tibial artery to the ankle. 3. Tandem stenoses in the mid SFA and tibioperoneal trunk. 4. Collateral reconstitution of peroneal and anterior tibial distal runoff as above. Electronically Signed   By: Lucrezia Europe M.D.   On: 04/09/2017 15:02     CBC  Recent Labs Lab 04/12/17 1452 04/12/17 1628 04/13/17 0022 04/14/17 0946 04/15/17 1410  WBC 14.1*  --  20.1* 15.5* 11.7*  HGB 10.6* 10.5* 11.7* 9.8* 10.0*  HCT 33.1* 31.0* 35.2* 30.1* 31.2*  PLT 239  --  183 180 207  MCV 95.9  --  90.0 92.6 94.0  MCH 30.7  --  29.9 30.2 30.1  MCHC 32.0  --  33.2 32.6 32.1  RDW 13.4  --  16.7* 17.0* 16.6*  LYMPHSABS 1.9  --   --   --   --   MONOABS 0.9  --   --   --   --   EOSABS 0.3  --   --   --   --   BASOSABS 0.0  --   --    --   --     Chemistries   Recent Labs Lab 04/12/17 1452 04/12/17 1628 04/13/17 0022 04/15/17 0157  NA 140 142 135 137  K 3.7 4.8 4.5 3.8  CL 106  --  105 107  CO2 22  --  22 24  GLUCOSE 309* 342* 354* 153*  BUN 16  --  24* 20  CREATININE 1.05*  --  1.25* 0.83  CALCIUM 9.1  --  8.5* 8.2*  AST  --   --   --  23  ALT  --   --   --  22  ALKPHOS  --   --   --  46  BILITOT  --   --   --  1.1   ------------------------------------------------------------------------------------------------------------------ estimated creatinine clearance is 38.5 mL/min (by C-G formula based on SCr of 0.83 mg/dL). ------------------------------------------------------------------------------------------------------------------  Recent Labs  04/14/17 0244  HGBA1C 6.4*   ------------------------------------------------------------------------------------------------------------------ No results for input(s): CHOL, HDL, LDLCALC, TRIG, CHOLHDL, LDLDIRECT in the last 72 hours. ------------------------------------------------------------------------------------------------------------------ No results for input(s): TSH, T4TOTAL, T3FREE, THYROIDAB in the last 72 hours.  Invalid input(s): FREET3 ------------------------------------------------------------------------------------------------------------------ No results for input(s): VITAMINB12, FOLATE, FERRITIN, TIBC, IRON, RETICCTPCT in the last 72 hours.  Coagulation profile  Recent Labs Lab 04/10/17 0313 04/12/17 1452 04/14/17 0244 04/15/17 0157 04/16/17 0213  INR 1.27 1.66 1.97 1.44 1.20    No results for input(s): DDIMER in the last 72 hours.  Cardiac Enzymes No results for input(s): CKMB, TROPONINI, MYOGLOBIN in the last 168 hours.  Invalid input(s): CK ------------------------------------------------------------------------------------------------------------------ Invalid input(s):  POCBNP   CBG:  Recent Labs Lab  04/15/17 1619 04/15/17 2053 04/16/17 0557 04/16/17 1112 04/16/17 1653  GLUCAP 164* 261* 174* 161* 108*       Studies: No results found.    Lab Results  Component Value Date   HGBA1C 6.4 (H) 04/14/2017   Lab Results  Component Value Date   CREATININE 0.83 04/15/2017       Scheduled Meds: . aspirin EC  325 mg Oral QHS  . calcium carbonate  1,250 mg Oral Q breakfast  . clopidogrel  75 mg Oral Daily  . docusate sodium  100 mg Oral Daily  . feeding supplement  237 mL Oral TID WC  . ferrous sulfate  325 mg Oral Q breakfast  . insulin aspart  0-5 Units Subcutaneous QHS  . insulin aspart  0-9 Units Subcutaneous TID WC  . mouth rinse  15 mL Mouth Rinse BID  . metoprolol tartrate  12.5 mg Oral BID  . pantoprazole  40 mg Oral Daily  . vitamin B-12  1,000 mcg Oral Daily  . Vitamin D (Ergocalciferol)  50,000 Units Oral Q Sun   Continuous Infusions: . lactated ringers 75 mL/hr at 04/16/17 0203     LOS: 4 days    Time spent: >30 MINS    [place] [facility] Pager [phone]. If 7PM-7AM, please contact night-coverage at [email or web address], password [email or web address] 04/16/2017, 5:24 PM  LOS: 4 days

## 2017-04-16 NOTE — Progress Notes (Signed)
Physical Therapy Treatment Patient Details Name: [name] MRN: [id number] DOB: September 08, 1928 Today's Date: 04/16/2017    History of Present Illness 83 yo admitted with right groin hematoma s/p evacuation 5/25 after 5/22 admit for critical ischemia of LLE due to clot of left fem pop BPG performed 03/25/17. s/p thrombectomy 5/23. PMhx: anemia, GERD, HTN, right 5th toe amp, L THA    PT Comments    Pt making good progress.   Follow Up Recommendations  SNF;Supervision/Assistance - 24 hour     Equipment Recommendations  None recommended by PT    Recommendations for Other Services OT consult     Precautions / Restrictions Precautions Precautions: Fall Precaution Comments: watch HR Restrictions Weight Bearing Restrictions: No    Mobility  Bed Mobility               General bed mobility comments: Pt up in chair  Transfers Overall transfer level: Needs assistance Equipment used: Rolling walker (2 wheeled) Transfers: Sit to/from Stand Sit to Stand: Min assist         General transfer comment: Assit to bring hips up and for anterior translation  Ambulation/Gait Ambulation/Gait assistance: Min assist Ambulation Distance (Feet): 125 Feet Assistive device: Rolling walker (2 wheeled) Gait Pattern/deviations: Trunk flexed;Step-through pattern;Decreased stride length Gait velocity: decr Gait velocity interpretation: Below normal speed for age/gender General Gait Details: Assist for balance. Verbal cues for more upright posture. Pt with more trunk flexion as she fatigued as distance incr. Pt HR only to low 100's with activity   Stairs            Wheelchair Mobility    Modified Rankin (Stroke Patients Only)       Balance Overall balance assessment: Needs assistance Sitting-balance support: Feet supported;Bilateral upper extremity supported Sitting balance-Leahy Scale: Good     Standing balance support: Bilateral upper extremity supported Standing  balance-Leahy Scale: Poor Standing balance comment: walker and min A for static standing                            Cognition Arousal/Alertness: Awake/alert Behavior During Therapy: WFL for tasks assessed/performed Overall Cognitive Status: Within Functional Limits for tasks assessed                                        Exercises      General Comments        Pertinent Vitals/Pain Pain Assessment: Faces Faces Pain Scale: Hurts a little bit Pain Location: sore groin right Pain Descriptors / Indicators: Sore Pain Intervention(s): Limited activity within patient's tolerance    Home Living                      Prior Function            PT Goals (current goals can now be found in the care plan section) Progress towards PT goals: Progressing toward goals;Goals met and updated - see care plan    Frequency    Min 3X/week      PT Plan Current plan remains appropriate    Co-evaluation              AM-PAC PT "6 Clicks" Daily Activity  Outcome Measure  Difficulty turning over in bed (including adjusting bedclothes, sheets and blankets)?: A Little Difficulty moving from lying on back to sitting on the  side of the bed? : A Lot Difficulty sitting down on and standing up from a chair with arms (e.g., wheelchair, bedside commode, etc,.)?: Total Help needed moving to and from a bed to chair (including a wheelchair)?: A Little Help needed walking in hospital room?: A Little Help needed climbing 3-5 steps with a railing? : A Lot 6 Click Score: 14    End of Session Equipment Utilized During Treatment: Gait belt Activity Tolerance: Patient tolerated treatment well Patient left: in chair;with call bell/phone within reach;with family/visitor present Nurse Communication: Mobility status PT Visit Diagnosis: Unsteadiness on feet (R26.81);Difficulty in walking, not elsewhere classified (R26.2);Muscle weakness (generalized) (M62.81)      Time: 9518-8416 PT Time Calculation (min) (ACUTE ONLY): 17 min  Charges:  $Gait Training: 8-22 mins                    G Codes:       [facility] PT [place] 04/16/2017, 10:40 AM

## 2017-04-16 NOTE — Progress Notes (Addendum)
  Progress Note    04/16/2017 7:44 AM 4 Days Post-Op  Subjective:  Feels better today except she went to the bathroom a lot last night; says she has a little pain in her left foot.  Afebrile HR  90's-100's Afib 287'O-676'H systolic 209% RA  Vitals:   04/16/17 0401 04/16/17 0619  BP: (!) 177/96 (!) 155/91  Pulse: 90   Resp: 20   Temp: 97.7 F (36.5 C)     Physical Exam: Cardiac:  irregular Lungs:  Non labored Incisions:  Clean and dry with ecchymosis Extremities:  + palpable right popliteal pulse   CBC    Component Value Date/Time   WBC 11.7 (H) 04/15/2017 1410   RBC 3.32 (L) 04/15/2017 1410   HGB 10.0 (L) 04/15/2017 1410   HCT 31.2 (L) 04/15/2017 1410   PLT 207 04/15/2017 1410   MCV 94.0 04/15/2017 1410   MCH 30.1 04/15/2017 1410   MCHC 32.1 04/15/2017 1410   RDW 16.6 (H) 04/15/2017 1410   LYMPHSABS 1.9 04/12/2017 1452   MONOABS 0.9 04/12/2017 1452   EOSABS 0.3 04/12/2017 1452   BASOSABS 0.0 04/12/2017 1452    BMET    Component Value Date/Time   NA 137 04/15/2017 0157   K 3.8 04/15/2017 0157   CL 107 04/15/2017 0157   CO2 24 04/15/2017 0157   GLUCOSE 153 (H) 04/15/2017 0157   BUN 20 04/15/2017 0157   CREATININE 0.83 04/15/2017 0157   CALCIUM 8.2 (L) 04/15/2017 0157   GFRNONAA >60 04/15/2017 0157   GFRAA >60 04/15/2017 0157    INR    Component Value Date/Time   INR 1.20 04/16/2017 0213     Intake/Output Summary (Last 24 hours) at 04/16/17 0744 Last data filed at 04/16/17 4709  Gross per 24 hour  Intake              600 ml  Output             1925 ml  Net            -1325 ml   Drain:  125cc (drainage from first shift not recorded)  Assessment:  83 y.o. female is s/p:  Evacuation of hematoma. Control bleeding from right femoral artery  4 Days Post-Op  Plan: -continue mobilization. -would probably keep JP drain for another day-output not recorded for 1st shift yesterday and had 125cc out overnight.     Leontine Locket, PA-C [facility] [phone] 04/16/2017 7:44 AM  I have examined the patient, reviewed and agree with above. Still with moderate amount of drainage from large hematoma cavity. Very weak. Continue to mobilize. Okay for skilled nursing facility from vascular standpoint at any time  Curt Jews, MD 04/16/2017 9:25 AM

## 2017-04-16 NOTE — Care Management Important Message (Signed)
Important Message  Patient Details  Name: [name] MRN: [id number] Date of Birth: [birth year]   Medicare Important Message Given:  Yes    Micaila Ziemba [place] 04/16/2017, 11:16 AM

## 2017-04-16 NOTE — Progress Notes (Deleted)
PT Cancellation Note  Patient Details Name: [name] MRN: [id number] DOB: 04/19/1928   Cancelled Treatment:    Reason Eval/Treat Not Completed: Pain limiting ability to participate. Pt getting back in bed and stating she can't amb at this time. Will check back later as time allows.   [place] 04/16/2017, 9:04 AM [place]

## 2017-04-16 NOTE — Progress Notes (Signed)
Patient and family stated late this morning after breakfast that every time patient eats and/or drinks, she coughs. Family said that they have noticed it a lot at home recently and it seems to happen all throughout the day, only when eating/drinking. Spoke to Dr. Karleen Hampshire about this and she stated to have speech come by and eval the patient. Order is in.

## 2017-04-17 ENCOUNTER — Telehealth: Payer: Self-pay | Admitting: Vascular Surgery

## 2017-04-17 ENCOUNTER — Inpatient Hospital Stay (HOSPITAL_COMMUNITY): Payer: Medicare Other

## 2017-04-17 ENCOUNTER — Other Ambulatory Visit: Payer: Medicare Other

## 2017-04-17 DIAGNOSIS — R05 Cough: Secondary | ICD-10-CM | POA: Diagnosis not present

## 2017-04-17 DIAGNOSIS — I70245 Atherosclerosis of native arteries of left leg with ulceration of other part of foot: Secondary | ICD-10-CM | POA: Diagnosis not present

## 2017-04-17 DIAGNOSIS — R262 Difficulty in walking, not elsewhere classified: Secondary | ICD-10-CM | POA: Diagnosis not present

## 2017-04-17 DIAGNOSIS — M7981 Nontraumatic hematoma of soft tissue: Secondary | ICD-10-CM | POA: Diagnosis not present

## 2017-04-17 DIAGNOSIS — M6281 Muscle weakness (generalized): Secondary | ICD-10-CM | POA: Diagnosis not present

## 2017-04-17 DIAGNOSIS — R278 Other lack of coordination: Secondary | ICD-10-CM | POA: Diagnosis not present

## 2017-04-17 DIAGNOSIS — M86172 Other acute osteomyelitis, left ankle and foot: Secondary | ICD-10-CM | POA: Diagnosis not present

## 2017-04-17 DIAGNOSIS — R600 Localized edema: Secondary | ICD-10-CM | POA: Diagnosis not present

## 2017-04-17 DIAGNOSIS — J90 Pleural effusion, not elsewhere classified: Secondary | ICD-10-CM

## 2017-04-17 DIAGNOSIS — T148XXA Other injury of unspecified body region, initial encounter: Secondary | ICD-10-CM | POA: Diagnosis not present

## 2017-04-17 DIAGNOSIS — I4891 Unspecified atrial fibrillation: Secondary | ICD-10-CM | POA: Diagnosis not present

## 2017-04-17 DIAGNOSIS — I83025 Varicose veins of left lower extremity with ulcer other part of foot: Secondary | ICD-10-CM | POA: Diagnosis not present

## 2017-04-17 DIAGNOSIS — I739 Peripheral vascular disease, unspecified: Secondary | ICD-10-CM | POA: Diagnosis not present

## 2017-04-17 DIAGNOSIS — Z5189 Encounter for other specified aftercare: Secondary | ICD-10-CM | POA: Diagnosis not present

## 2017-04-17 DIAGNOSIS — R1909 Other intra-abdominal and pelvic swelling, mass and lump: Secondary | ICD-10-CM | POA: Diagnosis not present

## 2017-04-17 DIAGNOSIS — I998 Other disorder of circulatory system: Secondary | ICD-10-CM | POA: Diagnosis not present

## 2017-04-17 DIAGNOSIS — I10 Essential (primary) hypertension: Secondary | ICD-10-CM | POA: Diagnosis not present

## 2017-04-17 DIAGNOSIS — L97529 Non-pressure chronic ulcer of other part of left foot with unspecified severity: Secondary | ICD-10-CM | POA: Diagnosis not present

## 2017-04-17 DIAGNOSIS — D649 Anemia, unspecified: Secondary | ICD-10-CM | POA: Diagnosis not present

## 2017-04-17 DIAGNOSIS — R0603 Acute respiratory distress: Secondary | ICD-10-CM | POA: Diagnosis not present

## 2017-04-17 DIAGNOSIS — L97522 Non-pressure chronic ulcer of other part of left foot with fat layer exposed: Secondary | ICD-10-CM | POA: Diagnosis not present

## 2017-04-17 DIAGNOSIS — Z9889 Other specified postprocedural states: Secondary | ICD-10-CM | POA: Diagnosis not present

## 2017-04-17 DIAGNOSIS — R2681 Unsteadiness on feet: Secondary | ICD-10-CM | POA: Diagnosis not present

## 2017-04-17 HISTORY — DX: Pleural effusion, not elsewhere classified: J90

## 2017-04-17 LAB — GLUCOSE, CAPILLARY
GLUCOSE-CAPILLARY: 247 mg/dL — AB (ref 65–99)
Glucose-Capillary: 149 mg/dL — ABNORMAL HIGH (ref 65–99)

## 2017-04-17 LAB — PROTIME-INR
INR: 1.25
PROTHROMBIN TIME: 15.8 s — AB (ref 11.4–15.2)

## 2017-04-17 MED ORDER — WARFARIN SODIUM 3 MG PO TABS: 3.0000 mg | ORAL_TABLET | Freq: Every day | ORAL | 1 refills | Status: DC

## 2017-04-17 MED ORDER — GUAIFENESIN-DM 100-10 MG/5ML PO SYRP: 15.0000 mL | ORAL_SOLUTION | ORAL | 0 refills | Status: DC | PRN

## 2017-04-17 MED ORDER — OXYCODONE-ACETAMINOPHEN 5-325 MG PO TABS: 1.0000 | ORAL_TABLET | Freq: Three times a day (TID) | ORAL | 0 refills | Status: DC | PRN

## 2017-04-17 MED ORDER — DOCUSATE SODIUM 100 MG PO CAPS: 100.0000 mg | ORAL_CAPSULE | Freq: Every day | ORAL | 0 refills | Status: DC

## 2017-04-17 MED ORDER — PANTOPRAZOLE SODIUM 40 MG PO TBEC: 40.0000 mg | DELAYED_RELEASE_TABLET | Freq: Every day | ORAL | 1 refills | Status: DC

## 2017-04-17 NOTE — Telephone Encounter (Signed)
Sched appt 05/13/17 at 12:00. Spoke to daughter in law to confirm appt.

## 2017-04-17 NOTE — Care Management Note (Signed)
Case Management Note  Patient Details  Name: [name] MRN: [id number] Date of Birth: 07-Jul-1928  Subjective/Objective:                 DC to SNF as faciliated by CSW.   Action/Plan:   Expected Discharge Date:  04/17/17               Expected Discharge Plan:  Skilled Nursing Facility  In-House Referral:  Clinical Social Work  Discharge planning Services  CM Consult  Post Acute Care Choice:    Choice offered to:     DME Arranged:    DME Agency:     HH Arranged:    [place] Agency:     Status of Service:  Completed, signed off  If discussed at [employer], dates discussed:    Additional Comments:  Carles Collet, RN 04/17/2017, 12:14 PM

## 2017-04-17 NOTE — Discharge Summary (Addendum)
Physician Discharge Summary  CHARLIE CHAR MRN: [id number] DOB/AGE: 1928-07-04 83 y.o.  PCP: Leanna Battles, MD   Admit date: 04/12/2017 Discharge date: 04/17/2017  Discharge Diagnoses:    Active Problems:   Groin swelling   Hematoma of groin   Hematoma    Follow-up recommendations Follow-up with PCP in 3-5 days , including all  additional recommended appointments as below Follow-up CBC, CMP in 3-5 days Resume Coumadin per vascular surgery recommendations Check INR daily,adjust coumadin dose accordingly       Current Discharge Medication List    START taking these medications   Details  docusate sodium (COLACE) 100 MG capsule Take 1 capsule (100 mg total) by mouth daily. Qty: 10 capsule, Refills: 0    guaiFENesin-dextromethorphan (ROBITUSSIN DM) 100-10 MG/5ML syrup Take 15 mLs by mouth every 4 (four) hours as needed for cough. Qty: 118 mL, Refills: 0    oxyCODONE-acetaminophen (PERCOCET/ROXICET) 5-325 MG tablet Take 1 tablet by mouth every 8 (eight) hours as needed for moderate pain. Qty: 10 tablet, Refills: 0    pantoprazole (PROTONIX) 40 MG tablet Take 1 tablet (40 mg total) by mouth daily. Qty: 30 tablet, Refills: 1      CONTINUE these medications which have CHANGED   Details  warfarin (COUMADIN) 3 MG tablet Take 1 tablet (3 mg total) by mouth daily at 6 PM. FOR A-FIB Qty: 30 tablet, Refills: 1      CONTINUE these medications which have NOT CHANGED   Details  acetaminophen (TYLENOL) 325 MG tablet Take 650 mg by mouth daily.     aspirin EC 325 MG tablet Take 325 mg by mouth at bedtime.    calcium carbonate (OSCAL) 1500 (600 Ca) MG TABS tablet Take 1 tablet by mouth daily with breakfast.     clopidogrel (PLAVIX) 75 MG tablet Take 1 tablet (75 mg total) by mouth daily. Qty: 30 tablet, Refills: 1    ferrous sulfate 325 (65 FE) MG tablet Take 325 mg by mouth daily with breakfast.    magnesium hydroxide (MILK OF MAGNESIA) 400 MG/5ML suspension Take 45 mLs  by mouth See admin instructions. AS NEEDED FOR 2 DAYS TO STOP ON 04/13/17    metoprolol succinate (TOPROL-XL) 25 MG 24 hr tablet Take 25 mg by mouth at bedtime.     vitamin B-12 (CYANOCOBALAMIN) 1000 MCG tablet Take 1,000 mcg by mouth daily.    Vitamin D, Ergocalciferol, (DRISDOL) 50000 units CAPS capsule Take 50,000 Units by mouth every 7 (seven) days. Sundays      STOP taking these medications     enoxaparin (LOVENOX) 60 MG/0.6ML injection         Discharge Condition: Stable Discharge Instructions Get Medicines reviewed and adjusted: Please take all your medications with you for your next visit with your Primary MD  Please request your Primary MD to go over all hospital tests and procedure/radiological results at the follow up, please ask your Primary MD to get all Hospital records sent to his/her office.  If you experience worsening of your admission symptoms, develop shortness of breath, life threatening emergency, suicidal or homicidal thoughts you must seek medical attention immediately by calling 911 or calling your MD immediately if symptoms less severe.  You must read complete instructions/literature along with all the possible adverse reactions/side effects for all the Medicines you take and that have been prescribed to you. Take any new Medicines after you have completely understood and accpet all the possible adverse reactions/side effects.   Do not drive when  taking Pain medications.   Do not take more than prescribed Pain, Sleep and Anxiety Medications  Special Instructions: If you have smoked or chewed Tobacco in the last 2 yrs please stop smoking, stop any regular Alcohol and or any Recreational drug use.  Wear Seat belts while driving.  Please note  You were cared for by a hospitalist during your hospital stay. Once you are discharged, your primary care physician will handle any further medical issues. Please note that NO REFILLS for any discharge medications  will be authorized once you are discharged, as it is imperative that you return to your primary care physician (or establish a relationship with a primary care physician if you do not have one) for your aftercare needs so that they can reassess your need for medications and monitor your lab values.     Allergies  Allergen Reactions  . Amprenavir Other (See Comments)    LISTED ON PATIENT'S MAR  . Ceftriaxone Sodium Other (See Comments)    Tingly feeling after given, then one hour later while vanco infusing pruritic rash  . Phenergan [Promethazine Hcl] Other (See Comments)    Hallucinations  . Sulfonamide Derivatives Hives  . Vancomycin Hcl Rash    Rash happened 1hr after rocephin but immediately after infusing vancomycin      Disposition: 03-Skilled Nursing Facility   Consults:  Vascular surgery Radiology     Significant Diagnostic Studies:  Ct Abdomen Pelvis Wo Contrast  Result Date: 04/12/2017 CLINICAL DATA:  Right inguinal swelling. EXAM: CT ABDOMEN AND PELVIS WITHOUT CONTRAST TECHNIQUE: Multidetector CT imaging of the abdomen and pelvis was performed following the standard protocol without IV contrast. COMPARISON:  None. FINDINGS: Lower chest: Lung bases are clear. Aortic atherosclerosis noted. Calcifications in the RCA and LAD coronary artery noted. Hepatobiliary: No focal liver abnormality. Multiple stones identified within the gallbladder. These measure up to 1.2 cm. No gallbladder wall thickening or biliary dilatation. Pancreas: Unremarkable. No pancreatic ductal dilatation or surrounding inflammatory changes. Spleen: Geographic area of low attenuation involving the inferior spleen is identified, new from 03/04/2017. Adrenals/Urinary Tract: The adrenal glands appear normal. Unremarkable appearance of the kidneys. The urinary bladder is within normal limits. Stomach/Bowel: Stomach is within normal limits. Appendix appears normal. No evidence of bowel wall thickening,  distention, or inflammatory changes. Vascular/Lymphatic: Aortic atherosclerosis. No aneurysm. No upper abdominal or pelvic adenopathy. Reproductive: The uterus is unremarkable. Left adnexal cyst is again noted measuring 5.3 cm and 7 HU. Other: There is a large well-circumscribed mass within the left inguinal region which extends laterally into the soft tissues overlying the lateral pelvis. The ventral component measures 3.9 x 8.4 by 9.2 cm. The more hyperdense lateral component Measures 8.7 x 4.1 by 7.5 cm. There is diffuse stranding within the surrounding soft tissues. Musculoskeletal: Previous left hip arthroplasty. Suggestion of right hip avascular necrosis, image 81 of series 8. There is degenerative disc disease noted throughout the lumbar spine. Compression fractures are noted at T11 and L5. IMPRESSION: 1. Examination is positive for large complex mass involving the ventral aspect of the right inguinal region and extending laterally to overly the right iliac bone. This is favored to represent a large acute and subacute hematoma. 2. 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 3.  Aortic Atherosclerosis (ICD10-I70.0). 4. Thoracic and lumbar compression deformities. 5. Gallstones These results were called by telephone at the time of interpretation on 04/12/2017 at 4:02 pm to Dr. Damita Dunnings , who verbally acknowledged these results. Electronically Signed   By: Queen Slough.D.  On: 04/12/2017 16:03   Ir Angiogram Extremity Left  Result Date: 04/08/2017 INDICATION: 83 year old female with acute thrombosis of left superficial femoral artery stent system. SFA and popliteal stent system placed 03/25/2017 for treatment of critical limb ischemia and nonhealing wound. She returns today for initiation of thrombolysis and treatment EXAM: ULTRASOUND GUIDED ACCESS RIGHT COMMON FEMORAL ARTERY ANGIOGRAM LEFT LOWER EXTREMITY INITIATION OF ARTERIAL THROMBOLYSIS WITH CATHETER DIRECTED THERAPY MEDICATIONS: None ANESTHESIA/SEDATION: Moderate  (conscious) sedation was employed during this procedure. A total of Versed 2.0 mg and Fentanyl 100 mcg was administered intravenously. Moderate Sedation Time: 45 minutes. The patient's level of consciousness and vital signs were monitored continuously by radiology nursing throughout the procedure under my direct supervision. CONTRAST:  40 cc Isovue FLUOROSCOPY TIME:  Fluoroscopy Time: 10 minutes 24 seconds (67 mGy). COMPLICATIONS: None PROCEDURE: Informed consent was obtained from the patient following explanation of the procedure, risks, benefits and alternatives. The patient understands, agrees and consents for the procedure. All questions were addressed. A time out was performed prior to the initiation of the procedure. Maximal barrier sterile technique utilized including caps, mask, sterile gowns, sterile gloves, large sterile drape, hand hygiene, and Betadine prep. Ultrasound survey of the right inguinal region was performed with images stored and sent to PACs. A micropuncture needle was used access the right common femoral artery under ultrasound. With excellent arterial blood flow returned, and an .018 micro wire was passed through the needle, observed enter the abdominal aorta under fluoroscopy. The needle was removed, and a micropuncture sheath was placed over the wire. The inner dilator and wire were removed, and an 035 Bentson wire was advanced under fluoroscopy into the abdominal aorta. The sheath was removed and a standard 5 [place] vascular sheath was placed. The dilator was removed and the sheath was flushed. Omni Flush catheter was advanced over the Bentson wire into the abdominal aorta and the Bentson wire was retracted to form the curve on the catheter. Catheter and wire were then directed down the contralateral common iliac artery. The wire was advanced to the superficial femoral artery. Catheter was removed an a 5 French vertebral catheter was placed. Angiogram of the left lower extremity was then  performed. Rosen wire was then passed through the catheter into the superficial femoral artery in the catheter was removed. Sheath was removed. A 6 French 45 cm Arrow braided sheath was then placed over the wire into the distal external iliac artery. Vertebral catheter was then placed over the Rosen wire in the [place] wire was removed. Angiogram was performed. Combination of Rosen wire and the vertebral catheter were then used to traverse the proximal 2/3 of the thrombosed superficial femoral artery. When the catheter reached the popliteal artery there was significant resistance. Vertebral catheter was removed and a Uni fuse infusion catheter, 135 cm length, 40 cm infusion length was selected. This was placed over the [facility] wire. Rosen wire was then was removed and a stiff Glidewire was then used. The tip of the Uni fuse catheter was slowly advanced into the origin of the posterior tibial artery. Glidewire was removed, limited angiogram was performed, and then the obturator wire was placed. Patient tolerated the procedure well. Patient remained hemodynamically stable throughout. No complications were encountered.  No significant blood loss FINDINGS: Angiogram demonstrates occlusion of the stent system of the distal superficial femoral artery an the popliteal artery. There is no significant reconstitution of flow into the distal tibial arteries, however, patency of the distal popliteal artery, tibioperoneal trunk, and the  proximal 1/3 of the anterior tibial artery was evident. The proximal posterior tibial artery was occluded. The anterior tibial artery is again occluded proximally, which was the case on the prior angiogram. There is critical stenosis at the origin of the peroneal artery. After placement of the infusion catheter, injection demonstrates partial filling of the posterior tibial artery. IMPRESSION: Status post ultrasound guided access of right common femoral artery and placement of a 40 cm length infusion  catheter across acute thrombosis of the left superficial femoral artery and popliteal artery for initiation of thrombolysis. Signed, Dulcy Fanny. Earleen Newport, DO [facility] Radiology Electronically Signed   By: Corrie Mckusick D.O.   On: 04/08/2017 13:41   Ir Angiogram Extremity Left  Result Date: 03/25/2017 INDICATION: 83 year old female with a history of left lower extremity critical limb ischemia and a nonhealing amputation site. Prior ankle brachial index measures in the mild range of arterial occlusive disease, with the segmental exam demonstrating evidence of a distal femoral or popliteal occlusion. CT performed 03/04/2017 confirms popliteal occlusion with questionable patency of the tibial vessels. She presents today for an angiogram and attempted treatment of the popliteal occlusion. EXAM: ULTRASOUND GUIDED ACCESS OF RIGHT COMMON FEMORAL ARTERY ANGIOGRAM LEFT LOWER EXTREMITY REVASCULARIZATION OF OCCLUDED POPLITEAL ARTERY WITH ANGIOPLASTY AND STENTING. REVASCULARIZATION OF OCCLUDED TIBIOPERONEAL ARTERY AND POSTERIOR TIBIAL ARTERY WITH BALLOON ANGIOPLASTY AND RESTORATION OF IN-LINE FLOW TO THE ANKLE MEDICATIONS: 200 MCG NITROGLYCERIN INTRA-ARTERIAL 8000 UNITS HEPARIN FOR ANTI COAGULATION 5 MG HYDRALAZINE ANESTHESIA/SEDATION: Moderate (conscious) sedation was employed during this procedure. A total of Versed 2.0 mg and Fentanyl 100 mcg was administered intravenously. Moderate Sedation Time: 180 minutes. The patient's level of consciousness and vital signs were monitored continuously by radiology nursing throughout the procedure under my direct supervision. CONTRAST:  200 cc Isovue-300 FLUOROSCOPY TIME:  Fluoroscopy Time: 38 minutes 12 seconds (104.9 mGy). COMPLICATIONS: None PROCEDURE: Informed consent was obtained from the patient following explanation of the procedure, risks, benefits and alternatives. The patient understands, agrees and consents for the procedure.  All questions were addressed. A time out was performed prior to the initiation of the procedure. Maximal barrier sterile technique utilized including caps, mask, sterile gowns, sterile gloves, large sterile drape, hand hygiene, and Betadine prep. Ultrasound survey of the right inguinal region was performed with images stored and sent to PACs. A micropuncture needle was used access the right common femoral artery under ultrasound. With excellent arterial blood flow returned, and an .018 micro wire was passed through the needle, observed enter the abdominal aorta under fluoroscopy. The needle was removed, and a micropuncture sheath was placed over the wire. The inner dilator and wire were removed, and an 035 Bentson wire was advanced under fluoroscopy into the abdominal aorta. The sheath was removed and a standard 5 [place] vascular sheath was placed. The dilator was removed and the sheath was flushed. Omni Flush catheter was advanced over the Bentson wire to the aortic bifurcation. Pelvic angiogram performed. Omni Flush catheter was then used to navigate the Bentson wire into the left external iliac artery. Omni Flush catheter was removed, and 100 cm vertebral catheter was advanced into the external iliac artery. Angiogram of the left lower extremity was performed. Rosen wire was then placed through the vertebral catheter and the 5 French short sheath was exchanged for a 6 [place] braided 65cm Arrow sheath. Sheath was advanced into the proximal superficial femoral artery. Multiple obliquities at the left knee were required in order to visualize the occlusion of  the popliteal artery given the left knee arthroplasty hardware. Once adequate angiogram was performed, the proximal occlusion was engaged with the vertebral catheter and an 035 Coons wire. The occlusion was traversed with the vertebral catheter, with the vertebral catheter in the below knee popliteal artery. Wire was removed and a luminal position was confirmed  with injection. 014 BMW wire was then advanced through the vertebral catheter and the vertebral catheter was removed. Balloon angioplasty was then performed to a diameter of 4 mm along the length of the abnormal distal superficial femoral artery and the popliteal occlusion into the below knee popliteal artery. A Supera 4 mm x 80 mm was deployed in the popliteal artery across the occluded site, with the stent structure extended proximally with a 5 mm x 80 mm stent. Repeat angiogram was performed. Balloon angioplasty was then performed along the length of the stent site with 5 mm by 60 mm. Combination of angled CXi catheter and 016 [place]athom wire were then used to engage the distal tibioperoneal trunk occlusion and the posterior tibial artery occlusion. This catheter and wire combination were successful with traversing the inclusion. Once the wire was removed and a luminal position was confirmed, the BMW wire was advanced through the CXI catheter into the posterior tibial artery. Balloon angioplasty was performed with 3 mm diameter balloon. Angiogram was performed, confirming patency to the ankle. All catheters and wires were removed. Final angiogram was performed. The sheath was then withdrawn to the right external iliac artery and angiogram was performed. The braided sheath was then exchanged for a short 6 [place] sheath. Given that the ACT greater than 150 at the time of conclusion, sheath remain in place on a pressurized saline bag while heparin was metabolites. The patient tolerated the procedure well and remained hemodynamically stable throughout. No complications were encountered and no significant blood loss. FINDINGS: Ultrasound survey of the right common femoral artery demonstrates mild calcifications with patent vessel. Angiogram of the pelvic vasculature demonstrates dense calcium with mild atherosclerotic changes bilaterally. Bilateral L4 and L5 lumbar arteries are patent. Bilateral hypogastric arteries are  patent with patent pelvic vasculature. No significant stenosis of the right or left external iliac arteries. Angiogram of the left lower extremity demonstrates mild disease of the proximal left superficial femoral artery with patent profunda femoris. 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 The below knee popliteal artery is patent from collateral flow. Anterior tibial artery origin is patent, occluded in the proximal third beyond the origin. Distal tibioperoneal trunk is occluded with occlusion extending into the proximal posterior tibial artery and peroneal artery. Collateral flow reconstitutes the distal peroneal artery, posterior tibial artery, and anterior tibial artery which remain patent at the ankle. There is moderate disease of the distal posterior tibial artery involving the common plantar, with the lateral plantar artery remain patent. Status post treatment of popliteal occlusion and placement of overlapping stent system proximally into the disease superficial femoral artery, there is excellent flow through the SFA and popliteal artery with no dissection. Continued flow through the proximal anterior tibial artery. Status post treatment of tibioperoneal occlusion and revascularization of the posterior tibial artery there is in-line flow restored through the posterior tibial artery to the ankle. At the conclusion of the case there is palpable posterior tibial pulse on the left. IMPRESSION: Status post left lower extremity angiogram and revascularization of chronic total occlusion of popliteal  artery, distal TP trunk, and origin of the posterior tibial artery with restoration of in-line flow to the ankle via the posterior tibial artery after stenting of the popliteal occlusion. Signed, Dulcy Fanny. Earleen Newport DO [facility] Radiology PLAN: The  right common femoral artery sheath will stay until with the ACT is below 200. Plan for closure with Exoseal. Gentle hydration IV. Right leg straight until the sheath is out. After the sheath is out the right hip will stay straight for 4 hours. Plan on DC home today. Should the patient have plateau of wound healing, the next target would be left anterior tibial artery in the angiosome, with previous discussion of tibial access and anterior tibial artery revascularization. Electronically Signed   By: Corrie Mckusick D.O.   On: 03/25/2017 17:16   Ir Angiogram Selective Each Additional Vessel  Result Date: 03/25/2017 INDICATION: 83 year old female with a history of left lower extremity critical limb ischemia and a nonhealing amputation site. Prior ankle brachial index measures in the mild range of arterial occlusive disease, with the segmental exam demonstrating evidence of a distal femoral or popliteal occlusion. CT performed 03/04/2017 confirms popliteal occlusion with questionable patency of the tibial vessels. She presents today for an angiogram and attempted treatment of the popliteal occlusion. EXAM: ULTRASOUND GUIDED ACCESS OF RIGHT COMMON FEMORAL ARTERY ANGIOGRAM LEFT LOWER EXTREMITY REVASCULARIZATION OF OCCLUDED POPLITEAL ARTERY WITH ANGIOPLASTY AND STENTING. REVASCULARIZATION OF OCCLUDED TIBIOPERONEAL ARTERY AND POSTERIOR TIBIAL ARTERY WITH BALLOON ANGIOPLASTY AND RESTORATION OF IN-LINE FLOW TO THE ANKLE MEDICATIONS: 200 MCG NITROGLYCERIN INTRA-ARTERIAL 8000 UNITS HEPARIN FOR ANTI COAGULATION 5 MG HYDRALAZINE ANESTHESIA/SEDATION: Moderate (conscious) sedation was employed during this procedure. A total of Versed 2.0 mg and Fentanyl 100 mcg was administered intravenously. Moderate Sedation Time: 180 minutes. The patient's level of consciousness and vital signs were monitored continuously by radiology nursing throughout the procedure under my direct supervision. CONTRAST:  200 cc Isovue-300 FLUOROSCOPY TIME:   Fluoroscopy Time: 38 minutes 12 seconds (104.9 mGy). COMPLICATIONS: None PROCEDURE: Informed consent was obtained from the patient following explanation of the procedure, risks, benefits and alternatives. The patient understands, agrees and consents for the procedure. All questions were addressed. A time out was performed prior to the initiation of the procedure. Maximal barrier sterile technique utilized including caps, mask, sterile gowns, sterile gloves, large sterile drape, hand hygiene, and Betadine prep. Ultrasound survey of the right inguinal region was performed with images stored and sent to PACs. A micropuncture needle was used access the right common femoral artery under ultrasound. With excellent arterial blood flow returned, and an .018 micro wire was passed through the needle, observed enter the abdominal aorta under fluoroscopy. The needle was removed, and a micropuncture sheath was placed over the wire. The inner dilator and wire were removed, and an 035 Bentson wire was advanced under fluoroscopy into the abdominal aorta. The sheath was removed and a standard 5 [place] vascular sheath was placed. The dilator was removed and the sheath was flushed. Omni Flush catheter was advanced over the Bentson wire to the aortic bifurcation. Pelvic angiogram performed. Omni Flush catheter was then used to navigate the Bentson wire into the left external iliac artery. Omni Flush catheter was removed, and 100 cm vertebral catheter was advanced into the external iliac artery. Angiogram of the left lower extremity was performed. Rosen wire was then placed through the vertebral catheter and the 5 French short sheath was exchanged for a 6 [place] braided 65cm Arrow sheath. Sheath was advanced into  the proximal superficial femoral artery. Multiple obliquities at the left knee were required in order to visualize the occlusion of the popliteal artery given the left knee arthroplasty hardware. Once adequate angiogram was  performed, the proximal occlusion was engaged with the vertebral catheter and an 035 Coons wire. The occlusion was traversed with the vertebral catheter, with the vertebral catheter in the below knee popliteal artery. Wire was removed and a luminal position was confirmed with injection. 014 BMW wire was then advanced through the vertebral catheter and the vertebral catheter was removed. Balloon angioplasty was then performed to a diameter of 4 mm along the length of the abnormal distal superficial femoral artery and the popliteal occlusion into the below knee popliteal artery. A Supera 4 mm x 80 mm was deployed in the popliteal artery across the occluded site, with the stent structure extended proximally with a 5 mm x 80 mm stent. Repeat angiogram was performed. Balloon angioplasty was then performed along the length of the stent site with 5 mm by 60 mm. Combination of angled CXi catheter and 016 Fathom wire were then used to engage the distal tibioperoneal trunk occlusion and the posterior tibial artery occlusion. This catheter and wire combination were successful with traversing the inclusion. Once the wire was removed and a luminal position was confirmed, the BMW wire was advanced through the CXI catheter into the posterior tibial artery. Balloon angioplasty was performed with 3 mm diameter balloon. Angiogram was performed, confirming patency to the ankle. All catheters and wires were removed. Final angiogram was performed. The sheath was then withdrawn to the right external iliac artery and angiogram was performed. The braided sheath was then exchanged for a short 6 [place] sheath. Given that the ACT greater than 150 at the time of conclusion, sheath remain in place on a pressurized saline bag while heparin was metabolites. The patient tolerated the procedure well and remained hemodynamically stable throughout. No complications were encountered and no significant blood loss. FINDINGS: Ultrasound survey of the  right common femoral artery demonstrates mild calcifications with patent vessel. Angiogram of the pelvic vasculature demonstrates dense calcium with mild atherosclerotic changes bilaterally. Bilateral L4 and L5 lumbar arteries are patent. Bilateral hypogastric arteries are patent with patent pelvic vasculature. No significant stenosis of the right or left external iliac arteries. Angiogram of the left lower extremity demonstrates mild disease of the proximal left superficial femoral artery with patent profunda femoris. 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 The below knee popliteal artery is patent from collateral flow. Anterior tibial artery origin is patent, occluded in the proximal third beyond the origin. Distal tibioperoneal trunk is occluded with occlusion extending into the proximal posterior tibial artery and peroneal artery. Collateral flow reconstitutes the distal peroneal artery, posterior tibial artery, and anterior tibial artery which remain patent at the ankle. There is moderate disease of the distal posterior tibial artery involving the common plantar, with the lateral plantar artery remain patent. Status post treatment of popliteal occlusion and placement of overlapping stent system proximally into the disease superficial femoral artery, there is excellent flow through the SFA and popliteal artery with no dissection. Continued flow through the proximal anterior tibial artery. Status post treatment of tibioperoneal occlusion and revascularization of the posterior tibial artery there is in-line flow restored through the posterior tibial artery to the ankle. At the conclusion of the case there is palpable posterior  tibial pulse on the left. IMPRESSION: Status post left lower extremity angiogram and revascularization of chronic total occlusion of popliteal artery, distal TP  trunk, and origin of the posterior tibial artery with restoration of in-line flow to the ankle via the posterior tibial artery after stenting of the popliteal occlusion. Signed, Dulcy Fanny. Earleen Newport DO [facility] Radiology PLAN: The right common femoral artery sheath will stay until with the ACT is below 200. Plan for closure with Exoseal. Gentle hydration IV. Right leg straight until the sheath is out. After the sheath is out the right hip will stay straight for 4 hours. Plan on DC home today. Should the patient have plateau of wound healing, the next target would be left anterior tibial artery in the angiosome, with previous discussion of tibial access and anterior tibial artery revascularization. Electronically Signed   By: Corrie Mckusick D.O.   On: 03/25/2017 17:16   Ir Fem Pop Art Stent Inc Pta Mod Sed  Result Date: 03/25/2017 INDICATION: 83 year old female with a history of left lower extremity critical limb ischemia and a nonhealing amputation site. Prior ankle brachial index measures in the mild range of arterial occlusive disease, with the segmental exam demonstrating evidence of a distal femoral or popliteal occlusion. CT performed 03/04/2017 confirms popliteal occlusion with questionable patency of the tibial vessels. She presents today for an angiogram and attempted treatment of the popliteal occlusion. EXAM: ULTRASOUND GUIDED ACCESS OF RIGHT COMMON FEMORAL ARTERY ANGIOGRAM LEFT LOWER EXTREMITY REVASCULARIZATION OF OCCLUDED POPLITEAL ARTERY WITH ANGIOPLASTY AND STENTING. REVASCULARIZATION OF OCCLUDED TIBIOPERONEAL ARTERY AND POSTERIOR TIBIAL ARTERY WITH BALLOON ANGIOPLASTY AND RESTORATION OF IN-LINE FLOW TO THE ANKLE MEDICATIONS: 200 MCG NITROGLYCERIN INTRA-ARTERIAL 8000 UNITS HEPARIN FOR ANTI COAGULATION 5 MG HYDRALAZINE ANESTHESIA/SEDATION: Moderate (conscious) sedation was employed during this procedure. A total of Versed 2.0 mg and Fentanyl 100 mcg was  administered intravenously. Moderate Sedation Time: 180 minutes. The patient's level of consciousness and vital signs were monitored continuously by radiology nursing throughout the procedure under my direct supervision. CONTRAST:  200 cc Isovue-300 FLUOROSCOPY TIME:  Fluoroscopy Time: 38 minutes 12 seconds (104.9 mGy). COMPLICATIONS: None PROCEDURE: Informed consent was obtained from the patient following explanation of the procedure, risks, benefits and alternatives. The patient understands, agrees and consents for the procedure. All questions were addressed. A time out was performed prior to the initiation of the procedure. Maximal barrier sterile technique utilized including caps, mask, sterile gowns, sterile gloves, large sterile drape, hand hygiene, and Betadine prep. Ultrasound survey of the right inguinal region was performed with images stored and sent to PACs. A micropuncture needle was used access the right common femoral artery under ultrasound. With excellent arterial blood flow returned, and an .018 micro wire was passed through the needle, observed enter the abdominal aorta under fluoroscopy. The needle was removed, and a micropuncture sheath was placed over the wire. The inner dilator and wire were removed, and an 035 Bentson wire was advanced under fluoroscopy into the abdominal aorta. The sheath was removed and a standard 5 [place] vascular sheath was placed. The dilator was removed and the sheath was flushed. Omni Flush catheter was advanced over the Bentson wire to the aortic bifurcation. Pelvic angiogram performed. Omni Flush catheter was then used to navigate the Bentson wire into the left external iliac artery. Omni Flush catheter was removed, and 100 cm vertebral catheter was advanced into the external iliac artery. Angiogram of the left lower extremity was performed. Rosen wire was then placed through  the vertebral catheter and the 5 French short sheath was exchanged for a 6 [place] braided  65cm Arrow sheath. Sheath was advanced into the proximal superficial femoral artery. Multiple obliquities at the left knee were required in order to visualize the occlusion of the popliteal artery given the left knee arthroplasty hardware. Once adequate angiogram was performed, the proximal occlusion was engaged with the vertebral catheter and an 035 [place] wire. The occlusion was traversed with the vertebral catheter, with the vertebral catheter in the below knee popliteal artery. Wire was removed and a luminal position was confirmed with injection. 014 BMW wire was then advanced through the vertebral catheter and the vertebral catheter was removed. Balloon angioplasty was then performed to a diameter of 4 mm along the length of the abnormal distal superficial femoral artery and the popliteal occlusion into the below knee popliteal artery. A Supera 4 mm x 80 mm was deployed in the popliteal artery across the occluded site, with the stent structure extended proximally with a 5 mm x 80 mm stent. Repeat angiogram was performed. Balloon angioplasty was then performed along the length of the stent site with 5 mm by 60 mm. Combination of angled CXi catheter and 016 Fathom wire were then used to engage the distal tibioperoneal trunk occlusion and the posterior tibial artery occlusion. This catheter and wire combination were successful with traversing the inclusion. Once the wire was removed and a luminal position was confirmed, the BMW wire was advanced through the CXI catheter into the posterior tibial artery. Balloon angioplasty was performed with 3 mm diameter balloon. Angiogram was performed, confirming patency to the ankle. All catheters and wires were removed. Final angiogram was performed. The sheath was then withdrawn to the right external iliac artery and angiogram was performed. The braided sheath was then exchanged for a short 6 [place] sheath. Given that the ACT greater than 150 at the time of conclusion, sheath  remain in place on a pressurized saline bag while heparin was metabolites. The patient tolerated the procedure well and remained hemodynamically stable throughout. No complications were encountered and no significant blood loss. FINDINGS: Ultrasound survey of the right common femoral artery demonstrates mild calcifications with patent vessel. Angiogram of the pelvic vasculature demonstrates dense calcium with mild atherosclerotic changes bilaterally. Bilateral L4 and L5 lumbar arteries are patent. Bilateral hypogastric arteries are patent with patent pelvic vasculature. No significant stenosis of the right or left external iliac arteries. Angiogram of the left lower extremity demonstrates mild disease of the proximal left superficial femoral artery with patent profunda femoris. 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 The below knee popliteal artery is patent from collateral flow. Anterior tibial artery origin is patent, occluded in the proximal third beyond the origin. Distal tibioperoneal trunk is occluded with occlusion extending into the proximal posterior tibial artery and peroneal artery. Collateral flow reconstitutes the distal peroneal artery, posterior tibial artery, and anterior tibial artery which remain patent at the ankle. There is moderate disease of the distal posterior tibial artery involving the common plantar, with the lateral plantar artery remain patent. Status post treatment of popliteal occlusion and placement of overlapping stent system proximally into the disease superficial femoral artery, there is excellent flow through the SFA and popliteal artery with no dissection. Continued flow through the proximal anterior tibial artery. Status post treatment of tibioperoneal occlusion and revascularization of the posterior tibial artery  there is in-line flow restored through  the posterior tibial artery to the ankle. At the conclusion of the case there is palpable posterior tibial pulse on the left. IMPRESSION: Status post left lower extremity angiogram and revascularization of chronic total occlusion of popliteal artery, distal TP trunk, and origin of the posterior tibial artery with restoration of in-line flow to the ankle via the posterior tibial artery after stenting of the popliteal occlusion. Signed, Dulcy Fanny. Earleen Newport DO [facility] Radiology PLAN: The right common femoral artery sheath will stay until with the ACT is below 200. Plan for closure with Exoseal. Gentle hydration IV. Right leg straight until the sheath is out. After the sheath is out the right hip will stay straight for 4 hours. Plan on DC home today. Should the patient have plateau of wound healing, the next target would be left anterior tibial artery in the angiosome, with previous discussion of tibial access and anterior tibial artery revascularization. Electronically Signed   By: Corrie Mckusick D.O.   On: 03/25/2017 17:16   Ir Tib-pero Art Pta Mod Sed  Result Date: 03/25/2017 INDICATION: 83 year old female with a history of left lower extremity critical limb ischemia and a nonhealing amputation site. Prior ankle brachial index measures in the mild range of arterial occlusive disease, with the segmental exam demonstrating evidence of a distal femoral or popliteal occlusion. CT performed 03/04/2017 confirms popliteal occlusion with questionable patency of the tibial vessels. She presents today for an angiogram and attempted treatment of the popliteal occlusion. EXAM: ULTRASOUND GUIDED ACCESS OF RIGHT COMMON FEMORAL ARTERY ANGIOGRAM LEFT LOWER EXTREMITY REVASCULARIZATION OF OCCLUDED POPLITEAL ARTERY WITH ANGIOPLASTY AND STENTING. REVASCULARIZATION OF OCCLUDED TIBIOPERONEAL ARTERY AND POSTERIOR TIBIAL ARTERY WITH BALLOON ANGIOPLASTY AND RESTORATION OF IN-LINE FLOW TO  THE ANKLE MEDICATIONS: 200 MCG NITROGLYCERIN INTRA-ARTERIAL 8000 UNITS HEPARIN FOR ANTI COAGULATION 5 MG HYDRALAZINE ANESTHESIA/SEDATION: Moderate (conscious) sedation was employed during this procedure. A total of Versed 2.0 mg and Fentanyl 100 mcg was administered intravenously. Moderate Sedation Time: 180 minutes. The patient's level of consciousness and vital signs were monitored continuously by radiology nursing throughout the procedure under my direct supervision. CONTRAST:  200 cc Isovue-300 FLUOROSCOPY TIME:  Fluoroscopy Time: 38 minutes 12 seconds (104.9 mGy). COMPLICATIONS: None PROCEDURE: Informed consent was obtained from the patient following explanation of the procedure, risks, benefits and alternatives. The patient understands, agrees and consents for the procedure. All questions were addressed. A time out was performed prior to the initiation of the procedure. Maximal barrier sterile technique utilized including caps, mask, sterile gowns, sterile gloves, large sterile drape, hand hygiene, and Betadine prep. Ultrasound survey of the right inguinal region was performed with images stored and sent to PACs. A micropuncture needle was used access the right common femoral artery under ultrasound. With excellent arterial blood flow returned, and an .018 micro wire was passed through the needle, observed enter the abdominal aorta under fluoroscopy. The needle was removed, and a micropuncture sheath was placed over the wire. The inner dilator and wire were removed, and an 035 Bentson wire was advanced under fluoroscopy into the abdominal aorta. The sheath was removed and a standard 5 [place] vascular sheath was placed. The dilator was removed and the sheath was flushed. Omni Flush catheter was advanced over the Bentson wire to the aortic bifurcation. Pelvic angiogram performed. Omni Flush catheter was then used to navigate the Bentson wire into the left external iliac artery. Omni Flush catheter was removed,  and 100 cm vertebral catheter  was advanced into the external iliac artery. Angiogram of the left lower extremity was performed. Rosen wire was then placed through the vertebral catheter and the 5 French short sheath was exchanged for a 6 [place] braided 65cm Arrow sheath. Sheath was advanced into the proximal superficial femoral artery. Multiple obliquities at the left knee were required in order to visualize the occlusion of the popliteal artery given the left knee arthroplasty hardware. Once adequate angiogram was performed, the proximal occlusion was engaged with the vertebral catheter and an 035 Coons wire. The occlusion was traversed with the vertebral catheter, with the vertebral catheter in the below knee popliteal artery. Wire was removed and a luminal position was confirmed with injection. 014 BMW wire was then advanced through the vertebral catheter and the vertebral catheter was removed. Balloon angioplasty was then performed to a diameter of 4 mm along the length of the abnormal distal superficial femoral artery and the popliteal occlusion into the below knee popliteal artery. A Supera 4 mm x 80 mm was deployed in the popliteal artery across the occluded site, with the stent structure extended proximally with a 5 mm x 80 mm stent. Repeat angiogram was performed. Balloon angioplasty was then performed along the length of the stent site with 5 mm by 60 mm. Combination of angled CXi catheter and 016 Fathom wire were then used to engage the distal tibioperoneal trunk occlusion and the posterior tibial artery occlusion. This catheter and wire combination were successful with traversing the inclusion. Once the wire was removed and a luminal position was confirmed, the BMW wire was advanced through the CXI catheter into the posterior tibial artery. Balloon angioplasty was performed with 3 mm diameter balloon. Angiogram was performed, confirming patency to the ankle. All catheters and wires were removed. Final  angiogram was performed. The sheath was then withdrawn to the right external iliac artery and angiogram was performed. The braided sheath was then exchanged for a short 6 [place] sheath. Given that the ACT greater than 150 at the time of conclusion, sheath remain in place on a pressurized saline bag while heparin was metabolites. The patient tolerated the procedure well and remained hemodynamically stable throughout. No complications were encountered and no significant blood loss. FINDINGS: Ultrasound survey of the right common femoral artery demonstrates mild calcifications with patent vessel. Angiogram of the pelvic vasculature demonstrates dense calcium with mild atherosclerotic changes bilaterally. Bilateral L4 and L5 lumbar arteries are patent. Bilateral hypogastric arteries are patent with patent pelvic vasculature. No significant stenosis of the right or left external iliac arteries. Angiogram of the left lower extremity demonstrates mild disease of the proximal left superficial femoral artery with patent profunda femoris. 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 The below knee popliteal artery is patent from collateral flow. Anterior tibial artery origin is patent, occluded in the proximal third beyond the origin. Distal tibioperoneal trunk is occluded with occlusion extending into the proximal posterior tibial artery and peroneal artery. Collateral flow reconstitutes the distal peroneal artery, posterior tibial artery, and anterior tibial artery which remain patent at the ankle. There is moderate disease of the distal posterior tibial artery involving the common plantar, with the lateral plantar artery remain patent. Status post treatment of popliteal occlusion and placement of overlapping stent system proximally into the disease superficial femoral artery, there is excellent flow  through the SFA and popliteal artery with no dissection.  Continued flow through the proximal anterior tibial artery. Status post treatment of tibioperoneal occlusion and revascularization of the posterior tibial artery there is in-line flow restored through the posterior tibial artery to the ankle. At the conclusion of the case there is palpable posterior tibial pulse on the left. IMPRESSION: Status post left lower extremity angiogram and revascularization of chronic total occlusion of popliteal artery, distal TP trunk, and origin of the posterior tibial artery with restoration of in-line flow to the ankle via the posterior tibial artery after stenting of the popliteal occlusion. Signed, Dulcy Fanny. Earleen Newport DO [facility] Radiology PLAN: The right common femoral artery sheath will stay until with the ACT is below 200. Plan for closure with Exoseal. Gentle hydration IV. Right leg straight until the sheath is out. After the sheath is out the right hip will stay straight for 4 hours. Plan on DC home today. Should the patient have plateau of wound healing, the next target would be left anterior tibial artery in the angiosome, with previous discussion of tibial access and anterior tibial artery revascularization. Electronically Signed   By: Corrie Mckusick D.O.   On: 03/25/2017 17:16   Ir US Guide Vasc Access Right  Result Date: 04/08/2017 INDICATION: 83 year old female with acute thrombosis of left superficial femoral artery stent system. SFA and popliteal stent system placed 03/25/2017 for treatment of critical limb ischemia and nonhealing wound. She returns today for initiation of thrombolysis and treatment EXAM: ULTRASOUND GUIDED ACCESS RIGHT COMMON FEMORAL ARTERY ANGIOGRAM LEFT LOWER EXTREMITY INITIATION OF ARTERIAL THROMBOLYSIS WITH CATHETER DIRECTED THERAPY MEDICATIONS: None ANESTHESIA/SEDATION: Moderate (conscious) sedation was employed during this procedure. A  total of Versed 2.0 mg and Fentanyl 100 mcg was administered intravenously. Moderate Sedation Time: 45 minutes. The patient's level of consciousness and vital signs were monitored continuously by radiology nursing throughout the procedure under my direct supervision. CONTRAST:  40 cc Isovue FLUOROSCOPY TIME:  Fluoroscopy Time: 10 minutes 24 seconds (67 mGy). COMPLICATIONS: None PROCEDURE: Informed consent was obtained from the patient following explanation of the procedure, risks, benefits and alternatives. The patient understands, agrees and consents for the procedure. All questions were addressed. A time out was performed prior to the initiation of the procedure. Maximal barrier sterile technique utilized including caps, mask, sterile gowns, sterile gloves, large sterile drape, hand hygiene, and Betadine prep. Ultrasound survey of the right inguinal region was performed with images stored and sent to PACs. A micropuncture needle was used access the right common femoral artery under ultrasound. With excellent arterial blood flow returned, and an .018 micro wire was passed through the needle, observed enter the abdominal aorta under fluoroscopy. The needle was removed, and a micropuncture sheath was placed over the wire. The inner dilator and wire were removed, and an 035 Bentson wire was advanced under fluoroscopy into the abdominal aorta. The sheath was removed and a standard 5 [place] vascular sheath was placed. The dilator was removed and the sheath was flushed. Omni Flush catheter was advanced over the Bentson wire into the abdominal aorta and the Bentson wire was retracted to form the curve on the catheter. Catheter and wire were then directed down the contralateral common iliac artery. The wire was advanced to the superficial femoral artery. Catheter was removed an a 5 French vertebral catheter was placed. Angiogram of the left lower extremity was then performed. Rosen wire was then passed through the catheter  into the superficial femoral artery in the catheter was removed. Sheath was removed. A  6 French 45 cm Arrow braided sheath was then placed over the wire into the distal external iliac artery. Vertebral catheter was then placed over the Rosen wire in the [place] wire was removed. Angiogram was performed. Combination of Rosen wire and the vertebral catheter were then used to traverse the proximal 2/3 of the thrombosed superficial femoral artery. When the catheter reached the popliteal artery there was significant resistance. Vertebral catheter was removed and a Uni fuse infusion catheter, 135 cm length, 40 cm infusion length was selected. This was placed over the [facility] wire. Rosen wire was then was removed and a stiff Glidewire was then used. The tip of the Uni fuse catheter was slowly advanced into the origin of the posterior tibial artery. Glidewire was removed, limited angiogram was performed, and then the obturator wire was placed. Patient tolerated the procedure well. Patient remained hemodynamically stable throughout. No complications were encountered.  No significant blood loss FINDINGS: Angiogram demonstrates occlusion of the stent system of the distal superficial femoral artery an the popliteal artery. There is no significant reconstitution of flow into the distal tibial arteries, however, patency of the distal popliteal artery, tibioperoneal trunk, and the proximal 1/3 of the anterior tibial artery was evident. The proximal posterior tibial artery was occluded. The anterior tibial artery is again occluded proximally, which was the case on the prior angiogram. There is critical stenosis at the origin of the peroneal artery. After placement of the infusion catheter, injection demonstrates partial filling of the posterior tibial artery. IMPRESSION: Status post ultrasound guided access of right common femoral artery and placement of a 40 cm length infusion catheter across acute thrombosis of the left superficial  femoral artery and popliteal artery for initiation of thrombolysis. Signed, Dulcy Fanny. Earleen Newport, DO [facility] Radiology Electronically Signed   By: Corrie Mckusick D.O.   On: 04/08/2017 13:41   Ir US Guide Vasc Access Right  Result Date: 03/25/2017 INDICATION: 83 year old female with a history of left lower extremity critical limb ischemia and a nonhealing amputation site. Prior ankle brachial index measures in the mild range of arterial occlusive disease, with the segmental exam demonstrating evidence of a distal femoral or popliteal occlusion. CT performed 03/04/2017 confirms popliteal occlusion with questionable patency of the tibial vessels. She presents today for an angiogram and attempted treatment of the popliteal occlusion. EXAM: ULTRASOUND GUIDED ACCESS OF RIGHT COMMON FEMORAL ARTERY ANGIOGRAM LEFT LOWER EXTREMITY REVASCULARIZATION OF OCCLUDED POPLITEAL ARTERY WITH ANGIOPLASTY AND STENTING. REVASCULARIZATION OF OCCLUDED TIBIOPERONEAL ARTERY AND POSTERIOR TIBIAL ARTERY WITH BALLOON ANGIOPLASTY AND RESTORATION OF IN-LINE FLOW TO THE ANKLE MEDICATIONS: 200 MCG NITROGLYCERIN INTRA-ARTERIAL 8000 UNITS HEPARIN FOR ANTI COAGULATION 5 MG HYDRALAZINE ANESTHESIA/SEDATION: Moderate (conscious) sedation was employed during this procedure. A total of Versed 2.0 mg and Fentanyl 100 mcg was administered intravenously. Moderate Sedation Time: 180 minutes. The patient's level of consciousness and vital signs were monitored continuously by radiology nursing throughout the procedure under my direct supervision. CONTRAST:  200 cc Isovue-300 FLUOROSCOPY TIME:  Fluoroscopy Time: 38 minutes 12 seconds (104.9 mGy). COMPLICATIONS: None PROCEDURE: Informed consent was obtained from the patient following explanation of the procedure, risks, benefits and alternatives. The patient understands, agrees and consents for the procedure. All questions were addressed. A time out was performed  prior to the initiation of the procedure. Maximal barrier sterile technique utilized including caps, mask, sterile gowns, sterile gloves, large sterile drape, hand hygiene, and Betadine prep. Ultrasound survey of the right inguinal region was performed with images  stored and sent to PACs. A micropuncture needle was used access the right common femoral artery under ultrasound. With excellent arterial blood flow returned, and an .018 micro wire was passed through the needle, observed enter the abdominal aorta under fluoroscopy. The needle was removed, and a micropuncture sheath was placed over the wire. The inner dilator and wire were removed, and an 035 Bentson wire was advanced under fluoroscopy into the abdominal aorta. The sheath was removed and a standard 5 [place] vascular sheath was placed. The dilator was removed and the sheath was flushed. Omni Flush catheter was advanced over the Bentson wire to the aortic bifurcation. Pelvic angiogram performed. Omni Flush catheter was then used to navigate the Bentson wire into the left external iliac artery. Omni Flush catheter was removed, and 100 cm vertebral catheter was advanced into the external iliac artery. Angiogram of the left lower extremity was performed. Rosen wire was then placed through the vertebral catheter and the 5 French short sheath was exchanged for a 6 [place] braided 65cm Arrow sheath. Sheath was advanced into the proximal superficial femoral artery. Multiple obliquities at the left knee were required in order to visualize the occlusion of the popliteal artery given the left knee arthroplasty hardware. Once adequate angiogram was performed, the proximal occlusion was engaged with the vertebral catheter and an 035 Coons wire. The occlusion was traversed with the vertebral catheter, with the vertebral catheter in the below knee popliteal artery. Wire was removed and a luminal position was confirmed with injection. 014 BMW wire was then advanced through  the vertebral catheter and the vertebral catheter was removed. Balloon angioplasty was then performed to a diameter of 4 mm along the length of the abnormal distal superficial femoral artery and the popliteal occlusion into the below knee popliteal artery. A Supera 4 mm x 80 mm was deployed in the popliteal artery across the occluded site, with the stent structure extended proximally with a 5 mm x 80 mm stent. Repeat angiogram was performed. Balloon angioplasty was then performed along the length of the stent site with 5 mm by 60 mm. Combination of angled CXi catheter and 016 Fathom wire were then used to engage the distal tibioperoneal trunk occlusion and the posterior tibial artery occlusion. This catheter and wire combination were successful with traversing the inclusion. Once the wire was removed and a luminal position was confirmed, the BMW wire was advanced through the CXI catheter into the posterior tibial artery. Balloon angioplasty was performed with 3 mm diameter balloon. Angiogram was performed, confirming patency to the ankle. All catheters and wires were removed. Final angiogram was performed. The sheath was then withdrawn to the right external iliac artery and angiogram was performed. The braided sheath was then exchanged for a short 6 [place] sheath. Given that the ACT greater than 150 at the time of conclusion, sheath remain in place on a pressurized saline bag while heparin was metabolites. The patient tolerated the procedure well and remained hemodynamically stable throughout. No complications were encountered and no significant blood loss. FINDINGS: Ultrasound survey of the right common femoral artery demonstrates mild calcifications with patent vessel. Angiogram of the pelvic vasculature demonstrates dense calcium with mild atherosclerotic changes bilaterally. Bilateral L4 and L5 lumbar arteries are patent. Bilateral hypogastric arteries are patent with patent pelvic vasculature. No significant  stenosis of the right or left external iliac arteries. Angiogram of the left lower extremity demonstrates mild disease of the proximal left superficial femoral artery with patent profunda femoris. 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 The below knee popliteal artery is patent from collateral flow. Anterior tibial artery origin is patent, occluded in the proximal third beyond the origin. Distal tibioperoneal trunk is occluded with occlusion extending into the proximal posterior tibial artery and peroneal artery. Collateral flow reconstitutes the distal peroneal artery, posterior tibial artery, and anterior tibial artery which remain patent at the ankle. There is moderate disease of the distal posterior tibial artery involving the common plantar, with the lateral plantar artery remain patent. Status post treatment of popliteal occlusion and placement of overlapping stent system proximally into the disease superficial femoral artery, there is excellent flow through the SFA and popliteal artery with no dissection. Continued flow through the proximal anterior tibial artery. Status post treatment of tibioperoneal occlusion and revascularization of the posterior tibial artery there is in-line flow restored through the posterior tibial artery to the ankle. At the conclusion of the case there is palpable posterior tibial pulse on the left. IMPRESSION: Status post left lower extremity angiogram and revascularization of chronic total occlusion of popliteal artery, distal TP trunk, and origin of the posterior tibial artery with restoration of in-line flow to the ankle via the posterior tibial artery after stenting of the popliteal occlusion. Signed, Dulcy Fanny. Earleen Newport DO [facility] Radiology PLAN: The right common femoral artery sheath will stay until  with the ACT is below 200. Plan for closure with Exoseal. Gentle hydration IV. Right leg straight until the sheath is out. After the sheath is out the right hip will stay straight for 4 hours. Plan on DC home today. Should the patient have plateau of wound healing, the next target would be left anterior tibial artery in the angiosome, with previous discussion of tibial access and anterior tibial artery revascularization. Electronically Signed   By: Corrie Mckusick D.O.   On: 03/25/2017 17:16   Ir Infusion Thrombol Arterial Initial (ms)  Result Date: 04/08/2017 INDICATION: 83 year old female with acute thrombosis of left superficial femoral artery stent system. SFA and popliteal stent system placed 03/25/2017 for treatment of critical limb ischemia and nonhealing wound. She returns today for initiation of thrombolysis and treatment EXAM: ULTRASOUND GUIDED ACCESS RIGHT COMMON FEMORAL ARTERY ANGIOGRAM LEFT LOWER EXTREMITY INITIATION OF ARTERIAL THROMBOLYSIS WITH CATHETER DIRECTED THERAPY MEDICATIONS: None ANESTHESIA/SEDATION: Moderate (conscious) sedation was employed during this procedure. A total of Versed 2.0 mg and Fentanyl 100 mcg was administered intravenously. Moderate Sedation Time: 45 minutes. The patient's level of consciousness and vital signs were monitored continuously by radiology nursing throughout the procedure under my direct supervision. CONTRAST:  40 cc Isovue FLUOROSCOPY TIME:  Fluoroscopy Time: 10 minutes 24 seconds (67 mGy). COMPLICATIONS: None PROCEDURE: Informed consent was obtained from the patient following explanation of the procedure, risks, benefits and alternatives. The patient understands, agrees and consents for the procedure. All questions were addressed. A time out was performed prior to the initiation of the procedure. Maximal barrier sterile technique utilized including caps, mask, sterile gowns, sterile gloves, large sterile drape, hand hygiene, and Betadine prep. Ultrasound  survey of the right inguinal region was performed with images stored and sent to PACs. A micropuncture needle was used access the right common femoral artery under ultrasound. With excellent arterial blood flow returned, and an .018 micro wire was passed through the needle, observed enter the abdominal aorta under fluoroscopy. The needle was removed, and  a micropuncture sheath was placed over the wire. The inner dilator and wire were removed, and an 035 Bentson wire was advanced under fluoroscopy into the abdominal aorta. The sheath was removed and a standard 5 [place] vascular sheath was placed. The dilator was removed and the sheath was flushed. Omni Flush catheter was advanced over the Bentson wire into the abdominal aorta and the Bentson wire was retracted to form the curve on the catheter. Catheter and wire were then directed down the contralateral common iliac artery. The wire was advanced to the superficial femoral artery. Catheter was removed an a 5 French vertebral catheter was placed. Angiogram of the left lower extremity was then performed. Rosen wire was then passed through the catheter into the superficial femoral artery in the catheter was removed. Sheath was removed. A 6 French 45 cm Arrow braided sheath was then placed over the wire into the distal external iliac artery. Vertebral catheter was then placed over the Rosen wire in the [place] wire was removed. Angiogram was performed. Combination of Rosen wire and the vertebral catheter were then used to traverse the proximal 2/3 of the thrombosed superficial femoral artery. When the catheter reached the popliteal artery there was significant resistance. Vertebral catheter was removed and a Uni fuse infusion catheter, 135 cm length, 40 cm infusion length was selected. This was placed over the [facility] wire. Rosen wire was then was removed and a stiff Glidewire was then used. The tip of the Uni fuse catheter was slowly advanced into the origin of the posterior  tibial artery. Glidewire was removed, limited angiogram was performed, and then the obturator wire was placed. Patient tolerated the procedure well. Patient remained hemodynamically stable throughout. No complications were encountered.  No significant blood loss FINDINGS: Angiogram demonstrates occlusion of the stent system of the distal superficial femoral artery an the popliteal artery. There is no significant reconstitution of flow into the distal tibial arteries, however, patency of the distal popliteal artery, tibioperoneal trunk, and the proximal 1/3 of the anterior tibial artery was evident. The proximal posterior tibial artery was occluded. The anterior tibial artery is again occluded proximally, which was the case on the prior angiogram. There is critical stenosis at the origin of the peroneal artery. After placement of the infusion catheter, injection demonstrates partial filling of the posterior tibial artery. IMPRESSION: Status post ultrasound guided access of right common femoral artery and placement of a 40 cm length infusion catheter across acute thrombosis of the left superficial femoral artery and popliteal artery for initiation of thrombolysis. Signed, Dulcy Fanny. Earleen Newport, DO [facility] Radiology Electronically Signed   By: Corrie Mckusick D.O.   On: 04/08/2017 13:41   Ir Jacolyn Reedy F/u Eval Art/ven Final Day (ms)  Result Date: 04/09/2017 INDICATION: Chronic left limb ischemia, status post revascularization of chronic total occlusion of popliteal artery through proximal posterior tibial artery 03/25/2017. Patient presented with distal occlusion, and is status post overnight catheter directed thrombolytic infusion. EXAM: LEFT LOWER EXTREMITY ARTERIOGRAM THROUGH EXISTING CATHETER MEDICATIONS: None required ANESTHESIA/SEDATION: None required CONTRAST:  60 mL Isovue-300 FLUOROSCOPY TIME:  3 minutes, 57 mGy COMPLICATIONS: None immediate. PROCEDURE: Informed  consent was obtained from the patient following explanation of the procedure, risks, benefits and alternatives. The patient understands, agrees and consents for the procedure. All questions were addressed. A time out was performed prior to the initiation of the procedure. Contrast was injected through previously placed the right femoral sheath for left lower extremity arteriography. At the conclusion  of the procedure, the infusion catheter was removed. The patient tolerated the procedure well. FINDINGS: Patent left profunda femoris. Atheromatous SFA with tandem areas of mild to moderate stenosis in its mid portion. Clearance of thrombus from the popliteal arterial stent. No residual thrombus or stenosis. Origin occlusion of the anterior tibial artery. Short-segment moderate stenosis just beyond the origin of the tibioperoneal trunk. Patent posterior tibial artery to just below the distal calf above the ankle as before ; no direct outflow to the plantar arch. Short-segment origin stenosis of the peroneal artery of at least moderate severity, patent distally with a prominent anterior communicating branch crossing the ankle. Reconstitution of the distal anterior tibial artery the mid calf level, patent across the ankle as dorsalis pedis. IMPRESSION: 1. Interval complete clearance of acute left lower extremity arterial thrombus after overnight catheter directed thrombolytic infusion. 2. Restoration of inline flow through the posterior tibial artery to the ankle. 3. Tandem stenoses in the mid SFA and tibioperoneal trunk. 4. Collateral reconstitution of peroneal and anterior tibial distal runoff as above. Electronically Signed   By: Lucrezia Europe M.D.   On: 04/09/2017 15:02        Filed Weights   04/12/17 1425 04/13/17 0325  Weight: 50.8 kg (112 lb) 51 kg (112 lb 7 oz)     Microbiology: Recent Results (from the past 240 hour(s))  MRSA PCR Screening     Status: None   Collection Time: 04/08/17 10:44 AM  Result  Value Ref Range Status   MRSA by PCR NEGATIVE NEGATIVE Final    Comment:        The GeneXpert MRSA Assay (FDA approved for NASAL specimens only), is one component of a comprehensive MRSA colonization surveillance program. It is not intended to diagnose MRSA infection nor to guide or monitor treatment for MRSA infections.   MRSA PCR Screening     Status: None   Collection Time: 04/12/17  6:32 PM  Result Value Ref Range Status   MRSA by PCR NEGATIVE NEGATIVE Final    Comment:        The GeneXpert MRSA Assay (FDA approved for NASAL specimens only), is one component of a comprehensive MRSA colonization surveillance program. It is not intended to diagnose MRSA infection nor to guide or monitor treatment for MRSA infections.        Blood Culture    Component Value Date/Time   SDES BLOOD LEFT ANTECUBITAL 02/09/2016 1445   SPECREQUEST BOTTLES DRAWN AEROBIC AND ANAEROBIC 5CC 02/09/2016 1445   CULT NO GROWTH 5 DAYS 02/09/2016 1445   REPTSTATUS 02/15/2016 FINAL 02/09/2016 1445      Labs: Results for orders placed or performed during the hospital encounter of 04/12/17 (from the past 48 hour(s))  Glucose, capillary     Status: Abnormal   Collection Time: 04/15/17 11:33 AM  Result Value Ref Range   Glucose-Capillary 185 (H) 65 - 99 mg/dL   Comment 1 Notify RN   CBC     Status: Abnormal   Collection Time: 04/15/17  2:10 PM  Result Value Ref Range   WBC 11.7 (H) 4.0 - 10.5 K/uL   RBC 3.32 (L) 3.87 - 5.11 MIL/uL   Hemoglobin 10.0 (L) 12.0 - 15.0 g/dL   HCT 31.2 (L) 36.0 - 46.0 %   MCV 94.0 78.0 - 100.0 fL   MCH 30.1 26.0 - 34.0 pg   MCHC 32.1 30.0 - 36.0 g/dL   RDW 16.6 (H) 11.5 - 15.5 %   Platelets 207 150 -  400 K/uL  Glucose, capillary     Status: Abnormal   Collection Time: 04/15/17  4:19 PM  Result Value Ref Range   Glucose-Capillary 164 (H) 65 - 99 mg/dL   Comment 1 Notify RN    Comment 2 Document in Chart   Glucose, capillary     Status: Abnormal   Collection  Time: 04/15/17  8:53 PM  Result Value Ref Range   Glucose-Capillary 261 (H) 65 - 99 mg/dL   Comment 1 Notify RN    Comment 2 Document in Chart   Protime-INR     Status: Abnormal   Collection Time: 04/16/17  2:13 AM  Result Value Ref Range   Prothrombin Time 15.3 (H) 11.4 - 15.2 seconds   INR 1.20   Glucose, capillary     Status: Abnormal   Collection Time: 04/16/17  5:57 AM  Result Value Ref Range   Glucose-Capillary 174 (H) 65 - 99 mg/dL  Glucose, capillary     Status: Abnormal   Collection Time: 04/16/17 11:12 AM  Result Value Ref Range   Glucose-Capillary 161 (H) 65 - 99 mg/dL   Comment 1 Notify RN   Glucose, capillary     Status: Abnormal   Collection Time: 04/16/17  4:53 PM  Result Value Ref Range   Glucose-Capillary 108 (H) 65 - 99 mg/dL   Comment 1 Notify RN    Comment 2 Document in Chart   Glucose, capillary     Status: Abnormal   Collection Time: 04/16/17 10:01 PM  Result Value Ref Range   Glucose-Capillary 142 (H) 65 - 99 mg/dL   Comment 1 Notify RN    Comment 2 Document in Chart   Protime-INR     Status: Abnormal   Collection Time: 04/17/17  2:07 AM  Result Value Ref Range   Prothrombin Time 15.8 (H) 11.4 - 15.2 seconds   INR 1.25   Glucose, capillary     Status: Abnormal   Collection Time: 04/17/17  6:24 AM  Result Value Ref Range   Glucose-Capillary 149 (H) 65 - 99 mg/dL     Lipid Panel  No results found for: CHOL, TRIG, HDL, CHOLHDL, VLDL, LDLCALC, LDLDIRECT   Lab Results  Component Value Date   HGBA1C 6.4 (H) 04/14/2017     Lab Results  Component Value Date   CREATININE 0.83 04/15/2017     HPI :  83 year old female who had nonhealing wound on her left foot. Underwent arteriography and intervention by interventional radiology on 03/25/2017 stenting in her SFA and popliteal and angioplasty of her distal posterior tibial artery. She had early reocclusion of this and on 04/08/2017 was taken back to the interventional suite. Again via the right  groin and a crossover she had lysis of the stent and reinnervation. This revealed that the posterior tibial was occluded distally where it had been opened. She had been discharged home. She presented 5/26 to emergency room with an extremely large expanding hematoma in her right groin and in the some respiratory distress with hypotension. I was consult to do. Patient had multiple family members present as well. Apparently she did have a DO NOT RESUSCITATE order but in discussing this with family wished all options to be attempted. Understood that she would be intubated for surgery and may have some difficulty in a summation as well. She was taken immediately to the operating room for resuscitation and repair  HOSPITAL COURSE:   Evacuation of hematoma. Control bleeding from right femoral artery Plan per  vascular surgery/IR Status post JP drain-Drain in rt groin now---followed by Vascular team. Follow-up CBC, hemoglobin 10 prior to discharge -minimal drainage in JP drain-dc per vascular  -okay to restart coumadin today per vascular  -f/u with Dr. Donnetta Hutching in 2-3 weeks - our office will call to arrange.  Okay to dc back to SNF from vascular standpoint.   History of atrial fibrillation, heart rate uncontrolled Resumed  metoprolol, Coumadin on hold as per vascular surgery, may be resumed based on vascular surgery recommendations, requested Dr. Donzetta Matters to comment Patient on aspirin and Plavix    Hypertension Blood pressure stable, continue metoprolol  Left lower extremity ischemia-resolved underwent LLE arteriogram with restoration of LLE outflow   on 5/22 and completed on 5/23 with restoration of pulses   Acute blood loss anemia Hemoglobin drop from 14.8 >9.8>10.0 Coumadin on hold, resume per vascular surgery recommendations   Discharge Exam:   Blood pressure (!) 158/74, pulse 89, temperature 98.8 F (37.1 C), temperature source Oral, resp. rate 18, height 5\' 2"  (1.575 m), weight 51 kg (112  lb 7 oz), SpO2 100 %.  Cardiovascular system: S1 & S2 heard, irre irre. No JVD, murmurs, rubs, gallops or clicks.  Gastrointestinal system: Abdomen is nondistended, soft and nontender. No organomegaly or masses felt. Normal bowel sounds heard. Central nervous system: Alert and oriented. No focal neurological deficits. Extremities:  Incisions: Clean and dry with extensive ecchymosis  Bilateral feet are warm; easily popliteal pulse on the right Skin: No rashes, lesions or ulcers Psychiatry: Judgement and insight appear normal. Mood & affect appropriate    Follow-up Information    Leanna Battles, MD. Call.   Specialty:  Internal Medicine Why:  Follow-up with PCP in 3-5 days Contact information: [place] [address] [phone]        Tommy Medal, Lavell Islam, MD Follow up.   Specialty:  Infectious Diseases Contact information: [address] [place] [address] [phone]        Rosetta Posner, MD. Call.   Specialties:  Vascular Surgery, Cardiology Why:  Hospital follow-up, call to make appointment within one week Contact information: [address] [place] [address] [phone]           Signed: Reyne Dumas 04/17/2017, 7:58 AM        Time spent >1 hour

## 2017-04-17 NOTE — Consult Note (Signed)
   [facility] CM Inpatient Consult   04/17/2017  [name] [birth year] [id number]    Patient screened for potential [facility] Care Management services. Chart reviewed. Noted current discharge plan is for  SNF.  There are no identifiable [facility] Care Management needs at this time. If patient's post hospital needs change, please place a [facility] Care Management consult. For questions please contact:  Marthenia Rolling, [place], RN,BSN [facility] Liaison [phone]

## 2017-04-17 NOTE — Telephone Encounter (Signed)
-----   Message from Mena Goes, RN sent at 04/17/2017 10:10 AM EDT ----- Regarding: 2-3 weeks   ----- Message ----- From: Gabriel Earing, PA-C Sent: 04/17/2017   8:10 AM To: Vvs Charge Pool  S/p Evacuation of hematoma. Control bleeding from right femoral artery.  F/u with Dr. Donnetta Hutching in 2-3 weeks.  Thanks, Aldona Bar

## 2017-04-17 NOTE — Progress Notes (Signed)
Clinical Social Worker facilitated patient discharge including contacting patient family and facility to confirm patient discharge plans.  Clinical information faxed to facility and family agreeable with plan.  Family stated they are willing to take patient to facility on their own .  RN to call [phone] (pt will be placed in room 703) report prior to discharge.  Clinical Social Worker will sign off for now as social work intervention is no longer needed. Please consult [place] again if new need arises.  Rhea Pink, MSW, [place]

## 2017-04-17 NOTE — Evaluation (Signed)
Clinical/Bedside Swallow Evaluation Patient Details  Name: [name] MRN: [id number] Date of Birth: Aug 24, 1928  Today's Date: 04/17/2017 Time: SLP Start Time (ACUTE ONLY): 3335 SLP Stop Time (ACUTE ONLY): 1138 SLP Time Calculation (min) (ACUTE ONLY): 26 min  Past Medical History:  Past Medical History:  Diagnosis Date  . Anemia   . Arthritis   . Dysrhythmia    hx brief AF post op hip surg  . GERD (gastroesophageal reflux disease)   . Hypertension    Past Surgical History:  Past Surgical History:  Procedure Laterality Date  . AMPUTATION Right 02/04/2013   Procedure: [name] ;  Surgeon: Wylene Simmer, MD;  Location: [place];  Service: Orthopedics;  Laterality: Right;  . APPENDECTOMY    . COLONOSCOPY    . EYE SURGERY     both cataracts  . FEMORAL ARTERY EXPLORATION Right 04/12/2017   Procedure: EVACUATION HEMATOMA RIGHT GROIN WITH FEMORAL ARTERY REPAIR;  Surgeon: Rosetta Posner, MD;  Location: [place];  Service: Vascular;  Laterality: Right;  . INCISION AND DRAINAGE  2011   left hip inf-hemovac  . IR ANGIOGRAM EXTREMITY LEFT  03/25/2017  . IR ANGIOGRAM EXTREMITY LEFT  04/08/2017  . IR ANGIOGRAM SELECTIVE EACH ADDITIONAL VESSEL  03/25/2017  . IR FEM POP ART STENT INC PTA MOD SED  03/25/2017  . IR INFUSION THROMBOL ARTERIAL INITIAL (MS)  04/08/2017  . IR THROMB F/U EVAL ART/VEN FINAL DAY (MS)  04/09/2017  . IR TIB-PERO ART PTA MOD SED  03/25/2017  . IR US GUIDE VASC ACCESS RIGHT  03/25/2017  . IR US GUIDE VASC ACCESS RIGHT  04/08/2017  . KNEE ARTHROSCOPY  1995   left  . NECK MASS EXCISION    . TONSILLECTOMY    . TOTAL HIP ARTHROPLASTY  2006   left-fx  . TOTAL KNEE ARTHROPLASTY  [id number]   left  . TOTAL KNEE ARTHROPLASTY  2009   right   HPI:  83 yo admitted with right groin hematoma s/p evacuation 5/25 after 5/22 admit for critical ischemia of LLE due to clot of left fem pop BPG performed 03/25/17. s/p thrombectomy 5/23. PMhx: anemia, GERD, HTN, right 5th  toe amp, L THA. Per chart, coughing with po intake, reported by family to have been occurring prior to admission. Esophagram complete in 2015 indicated small hiatal hernia and esophageal dysmotility. MBS complete in 2012 indicated normal oropharyngeal swallowing function.    Assessment / Plan / Recommendation Clinical Impression  Patient presents with signs suspicious for a primary esophageal dysphagia. Patient with reports of globus with dry solids, intermittent regurgitation, and occassional coughing with po intake confirmed during po presentation.  Cough response strong however and appearing effective to clear the airway. Patient has been without PNA, respiratory infections and has intact cognitive function. Educated patient and family (2 daughter in laws) on findings and use of additonal compensatory strategies which may mitigate aspiration risk and increase comfort with pos given known esophageal history. All verablized understanding. No SLP f/u indicated at this time. If s/s of aspiration increase in nature, may benefit from OP MBS.   SLP Visit Diagnosis: Dysphagia, unspecified (R13.10)    Aspiration Risk  Mild aspiration risk    Diet Recommendation Regular;Thin liquid   Liquid Administration via: Cup;Straw Medication Administration: Whole meds with liquid Supervision: Patient able to self feed Compensations: Slow rate;Small sips/bites;Follow solids with liquid Postural Changes: Seated upright at 90 degrees;Remain upright for at least 30 minutes after po intake  Other  Recommendations Oral Care Recommendations: Oral care BID   Follow up Recommendations None      Frequency and Duration            Prognosis        Swallow Study   General HPI: 83 yo admitted with right groin hematoma s/p evacuation 5/25 after 5/22 admit for critical ischemia of LLE due to clot of left fem pop BPG performed 03/25/17. s/p thrombectomy 5/23. PMhx: anemia, GERD, HTN, right 5th toe amp, L THA. Per  chart, coughing with po intake, reported by family to have been occurring prior to admission. Esophagram complete in 2015 indicated small hiatal hernia and esophageal dysmotility. MBS complete in 2012 indicated normal oropharyngeal swallowing function.  Type of Study: Bedside Swallow Evaluation Previous Swallow Assessment: see HPI Diet Prior to this Study: Regular;Thin liquids Temperature Spikes Noted: No Respiratory Status: Room air History of Recent Intubation: No Behavior/Cognition: Alert;Cooperative;Pleasant mood Oral Cavity Assessment: Within Functional Limits Oral Care Completed by SLP: Recent completion by staff Oral Cavity - Dentition: Adequate natural dentition Vision: Functional for self-feeding Self-Feeding Abilities: Able to feed self Patient Positioning: Upright in chair Baseline Vocal Quality: Normal Volitional Cough: Strong Volitional Swallow: Able to elicit    Oral/Motor/Sensory Function Overall Oral Motor/Sensory Function: Within functional limits   Ice Chips Ice chips: Not tested   Thin Liquid Thin Liquid: Impaired Presentation: Cup;Self Fed;Straw Pharyngeal  Phase Impairments: Cough - Immediate    Nectar Thick Nectar Thick Liquid: Not tested   Honey Thick Honey Thick Liquid: Not tested   Puree Puree: Within functional limits Presentation: Self Fed;Spoon   Solid   Dereonna Lensing MA, CCC-SLP [phone]  Solid: Within functional limits Presentation: Self Fed        Mysti Haley Meryl 04/17/2017,2:49 PM

## 2017-04-17 NOTE — Progress Notes (Addendum)
  Progress Note    04/17/2017 8:05 AM 5 Days Post-Op  Subjective:  Pt didn't sleep much last night -got up to go to the bathroom a lot.  Nurse gave her depends to wear.  Afebrile HR  80's-100's Afib 765'Y-650'P systolic 546% RA  Vitals:   04/16/17 2205 04/17/17 0436  BP: (!) 165/75 (!) 158/74  Pulse: 91 89  Resp: 18 18  Temp: 98 F (36.7 C) 98.8 F (37.1 C)    Physical Exam: Lungs:  Non labored Incisions:   Clean and dry with ecchymosis starting to improve Extremities:  + popliteal palpable pulse right   CBC    Component Value Date/Time   WBC 11.7 (H) 04/15/2017 1410   RBC 3.32 (L) 04/15/2017 1410   HGB 10.0 (L) 04/15/2017 1410   HCT 31.2 (L) 04/15/2017 1410   PLT 207 04/15/2017 1410   MCV 94.0 04/15/2017 1410   MCH 30.1 04/15/2017 1410   MCHC 32.1 04/15/2017 1410   RDW 16.6 (H) 04/15/2017 1410   LYMPHSABS 1.9 04/12/2017 1452   MONOABS 0.9 04/12/2017 1452   EOSABS 0.3 04/12/2017 1452   BASOSABS 0.0 04/12/2017 1452    BMET    Component Value Date/Time   NA 137 04/15/2017 0157   K 3.8 04/15/2017 0157   CL 107 04/15/2017 0157   CO2 24 04/15/2017 0157   GLUCOSE 153 (H) 04/15/2017 0157   BUN 20 04/15/2017 0157   CREATININE 0.83 04/15/2017 0157   CALCIUM 8.2 (L) 04/15/2017 0157   GFRNONAA >60 04/15/2017 0157   GFRAA >60 04/15/2017 0157    INR    Component Value Date/Time   INR 1.25 04/17/2017 0207     Intake/Output Summary (Last 24 hours) at 04/17/17 0805 Last data filed at 04/17/17 0639  Gross per 24 hour  Intake              530 ml  Output             1025 ml  Net             -495 ml     Assessment:  83 y.o. female is s/p:  Evacuation of hematoma. Control bleeding from right femoral artery  5 Days Post-Op  Plan: -minimal drainage in JP drain-will dc today -okay to restart coumadin today  -f/u with Dr. Donnetta Hutching in 2-3 weeks - our office will call to arrange.  Okay to dc back to SNF from vascular standpoint.    Leontine Locket,  PA-C [facility] [phone] 04/17/2017 8:05 AM  I have examined the patient, reviewed and agree with above.  Curt Jews, MD 04/17/2017 9:12 AM

## 2017-04-17 NOTE — Discharge Instructions (Signed)
Shower daily and wash the groin wound with soap and water daily and pat dry. (No tub bath-only shower)  Then put a dry gauze or washcloth there to keep this area dry daily and as needed.  Do not use Vaseline or neosporin on your incisions.  Only use soap and water on your incisions and then protect and keep dry to help prevent wound infection.

## 2017-04-17 NOTE — Progress Notes (Signed)
Occupational Therapy Treatment Patient Details Name: [name] MRN: [id number] DOB: [birth year] Today's Date: 04/17/2017    History of present illness 83 yo admitted with right groin hematoma s/p evacuation 5/25 after 5/22 admit for critical ischemia of LLE due to clot of left fem pop BPG performed 03/25/17. s/p thrombectomy 5/23. PMhx: anemia, GERD, HTN, right 5th toe amp, L THA   OT comments  Pt completed sponge bath, dressing and grooming sit<>stand at sink. Decreased activity tolerance requiring seated rest breaks. Continues to be unsteady on her feet requiring min assist. SNF continues to be the optimal d/c disposition for further rehab.  Follow Up Recommendations  SNF;Supervision/Assistance - 24 hour    Equipment Recommendations       Recommendations for Other Services      Precautions / Restrictions Precautions Precautions: Fall       Mobility Bed Mobility               General bed mobility comments: Pt up in chair  Transfers Overall transfer level: Needs assistance Equipment used: Rolling walker (2 wheeled) Transfers: Sit to/from Stand Sit to Stand: Min guard;Min assist         General transfer comment: increased time, cues for hand placement, min to min guard for stability    Balance Overall balance assessment: Needs assistance   Sitting balance-Leahy Scale: Good     Standing balance support: Single extremity supported Standing balance-Leahy Scale: Poor                             ADL either performed or assessed with clinical judgement   ADL Overall ADL's : Needs assistance/impaired     Grooming: Min guard;Wash/dry hands;Wash/dry face;Sitting;Standing;Oral care;Brushing hair Grooming Details (indicate cue type and reason): sit to stand from 3 in 1 at sink Upper Body Bathing: Minimal assistance;Sitting Upper Body Bathing Details (indicate cue type and reason): assisted with back     Upper Body Dressing : Minimal  assistance;Sitting   Lower Body Dressing: Min guard;Sitting/lateral leans Lower Body Dressing Details (indicate cue type and reason): pericare Toilet Transfer: Minimal assistance;BSC;Ambulation;RW   Toileting- Clothing Manipulation and Hygiene: Min guard;Sit to/from stand Toileting - Clothing Manipulation Details (indicate cue type and reason): requires one hand for support     Functional mobility during ADLs: Minimal assistance;Rolling walker General ADL Comments: Poor endurance during ADL, requires seated rest breaks.     Vision       Perception     Praxis      Cognition Arousal/Alertness: Awake/alert Behavior During Therapy: WFL for tasks assessed/performed Overall Cognitive Status: Within Functional Limits for tasks assessed                                          Exercises     Shoulder Instructions       General Comments      Pertinent Vitals/ Pain       Pain Assessment: Faces Faces Pain Scale: No hurt  Home Living                                          Prior Functioning/Environment              Frequency  Min 2X/week  Progress Toward Goals  OT Goals(current goals can now be found in the care plan section)  Progress towards OT goals: Progressing toward goals  Acute Rehab OT Goals Patient Stated Goal: return home eventually OT Goal Formulation: With patient Time For Goal Achievement: 04/28/17 Potential to Achieve Goals: Good  Plan Discharge plan remains appropriate    Co-evaluation                 AM-PAC PT "6 Clicks" Daily Activity     Outcome Measure   Help from another person eating meals?: None Help from another person taking care of personal grooming?: A Little Help from another person toileting, which includes using toliet, bedpan, or urinal?: A Little Help from another person bathing (including washing, rinsing, drying)?: A Lot Help from another person to put on and taking off  regular upper body clothing?: A Little Help from another person to put on and taking off regular lower body clothing?: A Lot 6 Click Score: 17    End of Session Equipment Utilized During Treatment: Gait belt;Rolling walker  OT Visit Diagnosis: Unsteadiness on feet (R26.81);Pain   Activity Tolerance Patient tolerated treatment well   Patient Left in chair;with call bell/phone within reach;with family/visitor present;with nursing/sitter in room   Nurse Communication          Time: 9390-3009 OT Time Calculation (min): 38 min  Charges: OT General Charges $OT Visit: 1 Procedure OT Treatments $Self Care/Home Management : 38-52 mins   Malka So 04/17/2017, 9:48 AM  [phone]

## 2017-04-18 NOTE — Progress Notes (Signed)
DOS 02/24/2017 Third toe amputation left foot: Incision and drainage left foot.

## 2017-04-21 ENCOUNTER — Ambulatory Visit (INDEPENDENT_AMBULATORY_CARE_PROVIDER_SITE_OTHER): Payer: Medicare Other

## 2017-04-21 ENCOUNTER — Ambulatory Visit: Payer: Medicare Other

## 2017-04-21 ENCOUNTER — Ambulatory Visit (INDEPENDENT_AMBULATORY_CARE_PROVIDER_SITE_OTHER): Payer: Medicare Other | Admitting: Podiatry

## 2017-04-21 DIAGNOSIS — I998 Other disorder of circulatory system: Secondary | ICD-10-CM | POA: Diagnosis not present

## 2017-04-21 DIAGNOSIS — M86172 Other acute osteomyelitis, left ankle and foot: Secondary | ICD-10-CM | POA: Diagnosis not present

## 2017-04-21 DIAGNOSIS — I83025 Varicose veins of left lower extremity with ulcer other part of foot: Secondary | ICD-10-CM

## 2017-04-21 DIAGNOSIS — Z9889 Other specified postprocedural states: Secondary | ICD-10-CM

## 2017-04-21 DIAGNOSIS — I4891 Unspecified atrial fibrillation: Secondary | ICD-10-CM | POA: Diagnosis not present

## 2017-04-21 DIAGNOSIS — I10 Essential (primary) hypertension: Secondary | ICD-10-CM | POA: Diagnosis not present

## 2017-04-21 DIAGNOSIS — I70245 Atherosclerosis of native arteries of left leg with ulceration of other part of foot: Secondary | ICD-10-CM | POA: Diagnosis not present

## 2017-04-21 DIAGNOSIS — L97522 Non-pressure chronic ulcer of other part of left foot with fat layer exposed: Secondary | ICD-10-CM

## 2017-04-21 DIAGNOSIS — D649 Anemia, unspecified: Secondary | ICD-10-CM | POA: Diagnosis not present

## 2017-04-21 DIAGNOSIS — L97529 Non-pressure chronic ulcer of other part of left foot with unspecified severity: Secondary | ICD-10-CM

## 2017-04-21 NOTE — Progress Notes (Signed)
   Subjective:  Patient presents today for follow-up evaluation and treatment of the third toe amputation left foot. Date of surgery 02/24/2017. She she reports discoloration of the left third toe. She states she is still having pain from the ulcer on the left ankle.    Objective/Physical Exam General: The patient is alert and oriented x3 in no acute distress.  Dermatology:  Wound #1 noted to the second digit amputation stump measuring 1.5 x 1.0 x 0.1 secondary to venous insufficiency (LxWxD).   To the noted ulceration(s), there is no eschar. There is a moderate amount of slough, fibrin, and necrotic tissue noted. Moderate amount of purulent drainage also noted. Granulation tissue and wound base is pink.  There is no exposed bone muscle-tendon ligament or joint. There is no malodor. Periwound integrity is intact. Skin is warm, dry and supple bilateral lower extremities.  Vascular: Ecchymosis with edema of the right lower extremity. Diminished pedal pulses bilaterally. No erythema noted. Capillary refill delayed.  Neurological: Epicritic and protective threshold diminished bilaterally.   Musculoskeletal: Amputation site of the third digit left foot appears stable. There is some maceration noted to the amputation site.  Radiographic Exam: evidence of stable second toe amputation.  Assessment: #1 status post left second digit amputation. DOS: 02/24/17. #2 ulcer second digit amputation stump secondary to venous insufficiency. #3 right ankle edema and ecchymosis.   Plan of Care:  #1 Patient was evaluated.x-rays reviewed. #2 medically necessary excisional debridement including muscle and deep fascial tissue was performed using a tissue nipper and a chisel blade. Excisional debridement of all the necrotic nonviable tissue down to healthy bleeding viable tissue was performed with post-debridement measurements same as pre-. #3 The wound was cleansed and dry sterile dressing applied. #4 continue  dressing changes. #5 return to clinic in 2 weeks.    [name], DPM [facility]  Dr. [name], [address]                                        [place], [place] [address]                Office [phone]  Fax [phone]

## 2017-04-23 DIAGNOSIS — R05 Cough: Secondary | ICD-10-CM

## 2017-04-23 DIAGNOSIS — R059 Cough, unspecified: Secondary | ICD-10-CM

## 2017-04-28 ENCOUNTER — Ambulatory Visit: Payer: Medicare Other | Admitting: Podiatry

## 2017-04-30 ENCOUNTER — Encounter: Payer: Self-pay | Admitting: Vascular Surgery

## 2017-05-01 ENCOUNTER — Ambulatory Visit
Admission: RE | Admit: 2017-05-01 | Discharge: 2017-05-01 | Disposition: A | Discharge status: Home / Self Care | Payer: Medicare Other | Source: Ambulatory Visit | Attending: Interventional Radiology | Admitting: Interventional Radiology

## 2017-05-01 DIAGNOSIS — I739 Peripheral vascular disease, unspecified: Secondary | ICD-10-CM

## 2017-05-01 NOTE — Progress Notes (Signed)
Chief Complaint: Critical Limb Ischemia  Referring Physician(s): Dr. Daylene Katayama, [facility]  PCP: Dr. Leanna Battles, [facility]   History of Present Illness: [name] is a 83 y.o. female presenting as a scheduled follow up to Vascular & Interventional Radiology for evaluation of her PAD and left CLI.   History: I first met [name] 02/26/2017 in consultation for a left foot wound after podiatric surgery.  She had a left third digit amputation performed 02/18/2017, and there was difficulty with wound healing.    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  Supera stent system was placed in the distal SFA/popliteal artery.   She was discharged on the same day 5/8 starting anti-platelet medication as well as restarting her anticoagulation which she takes for Afib and stroke risk reduction.  Given the coumadin therapy, we decided on 352m ASA daily as her anti-platelet strategy.   She returned for evaluation in our clinic on 04/03/2017 with new onset left foot coolness and concern for acute thrombus.  Non-invasive performed showed new occlusion of the stent system, so [facility] and restoration of flow through the stent system to the ankle.    I discussed her anti-coagulation strategy with Dr. PPhilip Aspenat this time, and we decided upon bridge to coumadin therapy with weight based lovenox injection on her DC at this time.  Anti-platelet prescribed on this DC was 727mplavix given her recent thrombosis on ASA 325.     [facility] and was eating lunch on 04/12/2017 when had acute pain at the right hip with hematoma.  Dr. RoDellia Nimsvaluated her [facility].  Acute hemorrhage was diagnosed at the right hip/thigh, presumably from femoral artery/branches, and given her acute blood loss and clinical status, went emergently to OR with Dr. EaDonnetta Hutchingf [facility] 5/26.  Femoral artery repair was performed. [facility]   Interval History:  [name] her with her 2 daughters for the appointment.    [facility], although still needs assistance with ambulation and standing, as she is still fairly deconditioned.  The plan is for her to recover and go home to live with her family.    She has seen Dr. EvAmalia Haileyt [facility] on 04/21/2017, with treatment of her left sided amputation wound.  She will be seeing Dr. EvAmalia Haileygain in ~2 weeks.    She tells me that she has pain on the lateral left foot occasionally, which is most evident at night when her feet are flat on the bed.  She has been unable to comfortably wear the boots that are [facility].  She denies any fevers rigors or chills.    She and her daughters are concerned with the surgical wound at the right hip.    She is currently back on anti-coagulation, and she is also taking dual-antiplatelet medication with ASA and Plavix.    Non-invasive exam was performed today.   Right ABI: 0.55 Left ABI: Non-compressible.  The waveforms at  the left foot are mono-phasic.      Past Medical History:  Diagnosis Date  . Anemia   . Arthritis   . Dysrhythmia    hx brief AF post op hip surg  . GERD (gastroesophageal reflux disease)   . Hypertension     Past Surgical History:  Procedure Laterality Date  . AMPUTATION Right 02/04/2013   Procedure: [name] ;  Surgeon: Wylene Simmer, MD;  Location: [place];  Service: Orthopedics;  Laterality: Right;  . APPENDECTOMY    . COLONOSCOPY    . EYE SURGERY     both cataracts  . FEMORAL  ARTERY EXPLORATION Right 04/12/2017   Procedure: EVACUATION HEMATOMA RIGHT GROIN WITH FEMORAL ARTERY REPAIR;  Surgeon: Rosetta Posner, MD;  Location: [place];  Service: Vascular;  Laterality: Right;  . INCISION AND DRAINAGE  2011   left hip inf-hemovac  . IR ANGIOGRAM EXTREMITY LEFT  03/25/2017  . IR ANGIOGRAM EXTREMITY LEFT  04/08/2017  . IR ANGIOGRAM SELECTIVE EACH ADDITIONAL VESSEL  03/25/2017  . IR FEM POP ART STENT INC PTA MOD SED  03/25/2017  . IR INFUSION THROMBOL ARTERIAL INITIAL (Tiffany)  04/08/2017  . IR THROMB F/U EVAL ART/VEN FINAL DAY (Tiffany)  04/09/2017  . IR TIB-PERO ART PTA MOD SED  03/25/2017  . IR US GUIDE VASC ACCESS RIGHT  03/25/2017  . IR US GUIDE VASC ACCESS RIGHT  04/08/2017  . KNEE ARTHROSCOPY  1995   left  . NECK MASS EXCISION    . TONSILLECTOMY    . TOTAL HIP ARTHROPLASTY  2006   left-fx  . TOTAL KNEE ARTHROPLASTY  2956,2130   left  . TOTAL KNEE ARTHROPLASTY  2009   right    Allergies: Amprenavir; Ceftriaxone sodium; Phenergan [promethazine hcl]; Sulfonamide derivatives; and Vancomycin hcl  Medications: Prior to Admission medications   Medication Sig Start Date End Date Taking? Authorizing Provider  acetaminophen (TYLENOL) 325 MG tablet Take 650 mg by mouth daily.     [provider]  aspirin EC 325 MG tablet Take 325 mg by mouth at bedtime.    [provider]  calcium carbonate (OSCAL) 1500 (600 Ca) MG TABS tablet Take 1 tablet by mouth daily with breakfast.     [provider]  clopidogrel (PLAVIX) 75 MG tablet Take 1 tablet (75 mg total) by mouth daily. 04/11/17   Saverio Danker, PA-C  docusate sodium (COLACE) 100 MG capsule Take 1 capsule (100 mg total) by mouth daily. 04/17/17   Reyne Dumas, MD  ferrous sulfate 325 (65 FE) MG tablet Take 325 mg by mouth daily with breakfast.    [provider]  guaiFENesin-dextromethorphan (ROBITUSSIN DM) 100-10 MG/5ML syrup Take 15 mLs by mouth every 4 (four) hours as needed for cough. 04/17/17   Reyne Dumas, MD  magnesium hydroxide (MILK OF MAGNESIA) 400 MG/5ML suspension Take 45 mLs by mouth See admin instructions. AS NEEDED FOR 2 DAYS TO STOP ON 04/13/17    [provider]  metoprolol succinate (TOPROL-XL) 25 MG 24 hr tablet Take 25 mg by mouth at bedtime.     [provider]  oxyCODONE-acetaminophen (PERCOCET/ROXICET) 5-325 MG tablet Take 1 tablet by mouth every 8 (eight) hours as needed for moderate pain. 04/17/17   Reyne Dumas, MD  pantoprazole (PROTONIX) 40 MG tablet Take 1 tablet (40 mg total) by mouth daily. 04/17/17   Reyne Dumas, MD  traMADol (ULTRAM) 50 MG tablet Take 1 mg by mouth every 6 (six)  hours as needed. 02/24/17   [name], DPM  vitamin B-12 (CYANOCOBALAMIN) 1000 MCG tablet Take 1,000 mcg by mouth daily.    [provider]  Vitamin D, Ergocalciferol, (DRISDOL) 50000 units CAPS capsule Take 50,000 Units by mouth every 7 (seven) days. Sundays    [provider]  warfarin (COUMADIN) 3 MG tablet Take 1 tablet (3 mg total) by mouth daily at 6 PM. FOR A-FIB 04/18/17   Reyne Dumas, MD     Family History  Problem Relation Age of Onset  . Cancer Mother     Social History   Social History  . Marital status: Married    Spouse name: N/A  . Number of children: N/A  . Years of education: N/A   Social History Main Topics  . Smoking status: Never Smoker  . Smokeless tobacco: Never Used  . Alcohol use No  . Drug use: No  . Sexual activity: Not on file   Other Topics Concern  . Not on file   Social History Narrative  . No narrative on file     Review of Systems: A 12 point ROS discussed and pertinent positives are indicated in the HPI above.  All other systems are negative.  Review of Systems  Vital Signs: There were no vitals taken for this visit.  Physical Exam General: 83 yo female appearing stated age.  Well-developed, well-nourished.  No distress.  She is currently in a wheelchair.  Needs assistance with standing, but  is able to balance upright without assistance.   Neurologic: Alert & Oriented to person, place, and time.   Normal affect and insight.  Appropriate questions.  Moving all 4 extremities with gross sensory intact.     Extremities:   Right hip surgical site with short ~1cm shallow dehiscence, without erythema or drainage.  Granulation tissue in the bed.  The remainder of the incision is intact and clean.  No significant ecchymosis of the right lower extremity.  Hemosiderin stain at the anterior calf on the right. Mild swelling of the right foot.  Surgical dressing on the left third toe amputation site.  No erythema.   Warm foot, well perfused.  No left sided swelling.   Imaging: Ct Abdomen Pelvis Wo Contrast  Result Date: 04/12/2017 CLINICAL DATA:  Right inguinal swelling. EXAM: CT ABDOMEN AND PELVIS WITHOUT CONTRAST TECHNIQUE: Multidetector CT imaging of the abdomen and pelvis was performed following the standard protocol without IV contrast. COMPARISON:  None. FINDINGS: Lower chest: Lung bases are clear. Aortic atherosclerosis noted. Calcifications in the RCA and LAD coronary artery noted. Hepatobiliary: No focal liver abnormality. Multiple stones identified within the gallbladder. These measure up to 1.2 cm. No gallbladder wall thickening or biliary dilatation. Pancreas: Unremarkable. No pancreatic ductal dilatation or surrounding inflammatory changes. Spleen: Geographic area of low attenuation involving the inferior spleen is identified, new from 03/04/2017. Adrenals/Urinary Tract: The adrenal glands appear normal. Unremarkable appearance of the kidneys. The urinary bladder is within normal limits. Stomach/Bowel: Stomach is within normal limits. Appendix appears normal. No evidence of bowel wall thickening, distention, or inflammatory changes. Vascular/Lymphatic: Aortic atherosclerosis. No aneurysm. No upper abdominal or pelvic adenopathy. Reproductive: The uterus is unremarkable. Left adnexal cyst is  again noted measuring 5.3 cm and 7 HU. Other: There is a large well-circumscribed mass within the left inguinal region which extends laterally into the soft tissues overlying the lateral pelvis. The ventral component measures 3.9 x 8.4 by 9.2 cm. The more hyperdense lateral component Measures 8.7 x  4.1 by 7.5 cm. There is diffuse stranding within the surrounding soft tissues. Musculoskeletal: Previous left hip arthroplasty. Suggestion of right hip avascular necrosis, image 81 of series 8. There is degenerative disc disease noted throughout the lumbar spine. Compression fractures are noted at T11 and L5. IMPRESSION: 1. Examination is positive for large complex mass involving the ventral aspect of the right inguinal region and extending laterally to overly the right iliac bone. This is favored to represent a large acute and subacute hematoma. 2. 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 3.  Aortic Atherosclerosis (ICD10-I70.0). 4. Thoracic and lumbar compression deformities. 5. Gallstones These results were called by telephone at the time of interpretation on 04/12/2017 at 4:02 pm to Dr. Damita Dunnings , who verbally acknowledged these results. Electronically Signed   By: Kerby Moors M.D.   On: 04/12/2017 16:03   Dg Ankle Complete Right  Result Date: 04/21/2017 Please see detailed radiograph report in office note.  Ir Angiogram Extremity Left  Result Date: 04/08/2017 INDICATION: 83 year old female with acute thrombosis of left superficial femoral artery stent system. SFA and popliteal stent system placed 03/25/2017 for treatment of critical limb ischemia and nonhealing wound. She returns today for initiation of thrombolysis and treatment EXAM: ULTRASOUND GUIDED ACCESS RIGHT COMMON FEMORAL ARTERY ANGIOGRAM LEFT LOWER EXTREMITY INITIATION OF ARTERIAL THROMBOLYSIS WITH CATHETER DIRECTED THERAPY MEDICATIONS: None ANESTHESIA/SEDATION: Moderate (conscious) sedation was employed during this procedure. A total of Versed 2.0 mg and  Fentanyl 100 mcg was administered intravenously. Moderate Sedation Time: 45 minutes. The patient's level of consciousness and vital signs were monitored continuously by radiology nursing throughout the procedure under my direct supervision. CONTRAST:  40 cc Isovue FLUOROSCOPY TIME:  Fluoroscopy Time: 10 minutes 24 seconds (67 mGy). COMPLICATIONS: None PROCEDURE: Informed consent was obtained from the patient following explanation of the procedure, risks, benefits and alternatives. The patient understands, agrees and consents for the procedure. All questions were addressed. A time out was performed prior to the initiation of the procedure. Maximal barrier sterile technique utilized including caps, mask, sterile gowns, sterile gloves, large sterile drape, hand hygiene, and Betadine prep. Ultrasound survey of the right inguinal region was performed with images stored and sent to PACs. A micropuncture needle was used access the right common femoral artery under ultrasound. With excellent arterial blood flow returned, and an .018 micro wire was passed through the needle, observed enter the abdominal aorta under fluoroscopy. The needle was removed, and a micropuncture sheath was placed over the wire. The inner dilator and wire were removed, and an 035 Bentson wire was advanced under fluoroscopy into the abdominal aorta. The sheath was removed and a standard 5 [place] vascular sheath was placed. The dilator was removed and the sheath was flushed. Omni Flush catheter was advanced over the Bentson wire into the abdominal aorta and the Bentson wire was retracted to form the curve on the catheter. Catheter and wire were then directed down the contralateral common iliac artery. The wire was advanced to the superficial femoral artery. Catheter was removed an a 5 French vertebral catheter was placed. Angiogram of the left lower extremity was then performed. Rosen wire was then passed through the catheter into the superficial  femoral artery in the catheter was removed. Sheath was removed. A 6 French 45 cm Arrow braided sheath was then placed over the wire into the distal external iliac artery. Vertebral catheter was then placed over the Rosen wire in the [place] wire was removed. Angiogram was performed. Combination of Rosen wire and the vertebral catheter  were then used to traverse the proximal 2/3 of the thrombosed superficial femoral artery. When the catheter reached the popliteal artery there was significant resistance. Vertebral catheter was removed and a Uni fuse infusion catheter, 135 cm length, 40 cm infusion length was selected. This was placed over the [facility] wire. Rosen wire was then was removed and a stiff Glidewire was then used. The tip of the Uni fuse catheter was slowly advanced into the origin of the posterior tibial artery. Glidewire was removed, limited angiogram was performed, and then the obturator wire was placed. Patient tolerated the procedure well. Patient remained hemodynamically stable throughout. No complications were encountered.  No significant blood loss FINDINGS: Angiogram demonstrates occlusion of the stent system of the distal superficial femoral artery an the popliteal artery. There is no significant reconstitution of flow into the distal tibial arteries, however, patency of the distal popliteal artery, tibioperoneal trunk, and the proximal 1/3 of the anterior tibial artery was evident. The proximal posterior tibial artery was occluded. The anterior tibial artery is again occluded proximally, which was the case on the prior angiogram. There is critical stenosis at the origin of the peroneal artery. After placement of the infusion catheter, injection demonstrates partial filling of the posterior tibial artery. IMPRESSION: Status post ultrasound guided access of right common femoral artery and placement of a 40 cm length infusion catheter across acute thrombosis of the left superficial femoral artery and  popliteal artery for initiation of thrombolysis. Signed, Dulcy Fanny. Earleen Newport, DO [facility] Electronically Signed   By: Corrie Mckusick D.O.   On: 04/08/2017 13:41   Ir US Guide Vasc Access Right  Result Date: 04/08/2017 INDICATION: 83 year old female with acute thrombosis of left superficial femoral artery stent system. SFA and popliteal stent system placed 03/25/2017 for treatment of critical limb ischemia and nonhealing wound. She returns today for initiation of thrombolysis and treatment EXAM: ULTRASOUND GUIDED ACCESS RIGHT COMMON FEMORAL ARTERY ANGIOGRAM LEFT LOWER EXTREMITY INITIATION OF ARTERIAL THROMBOLYSIS WITH CATHETER DIRECTED THERAPY MEDICATIONS: None ANESTHESIA/SEDATION: Moderate (conscious) sedation was employed during this procedure. A total of Versed 2.0 mg and Fentanyl 100 mcg was administered intravenously. Moderate Sedation Time: 45 minutes. The patient's level of consciousness and vital signs were monitored continuously by radiology nursing throughout the procedure under my direct supervision. CONTRAST:  40 cc Isovue FLUOROSCOPY TIME:  Fluoroscopy Time: 10 minutes 24 seconds (67 mGy). COMPLICATIONS: None PROCEDURE: Informed consent was obtained from the patient following explanation of the procedure, risks, benefits and alternatives. The patient understands, agrees and consents for the procedure. All questions were addressed. A time out was performed prior to the initiation of the procedure. Maximal barrier sterile technique utilized including caps, mask, sterile gowns, sterile gloves, large sterile drape, hand hygiene, and Betadine prep. Ultrasound survey of the right inguinal region was performed with images stored and sent to PACs. A micropuncture needle was used access the right common femoral artery under ultrasound. With excellent arterial blood flow returned, and an .018 micro wire was passed through the needle, observed enter the  abdominal aorta under fluoroscopy. The needle was removed, and a micropuncture sheath was placed over the wire. The inner dilator and wire were removed, and an 035 Bentson wire was advanced under fluoroscopy into the abdominal aorta. The sheath was removed and a standard 5 [place] vascular sheath was placed. The dilator was removed and the sheath was flushed. Omni Flush catheter was advanced over the Bentson wire into the abdominal aorta and the  Bentson wire was retracted to form the curve on the catheter. Catheter and wire were then directed down the contralateral common iliac artery. The wire was advanced to the superficial femoral artery. Catheter was removed an a 5 French vertebral catheter was placed. Angiogram of the left lower extremity was then performed. Rosen wire was then passed through the catheter into the superficial femoral artery in the catheter was removed. Sheath was removed. A 6 French 45 cm Arrow braided sheath was then placed over the wire into the distal external iliac artery. Vertebral catheter was then placed over the Rosen wire in the [place] wire was removed. Angiogram was performed. Combination of Rosen wire and the vertebral catheter were then used to traverse the proximal 2/3 of the thrombosed superficial femoral artery. When the catheter reached the popliteal artery there was significant resistance. Vertebral catheter was removed and a Uni fuse infusion catheter, 135 cm length, 40 cm infusion length was selected. This was placed over the [facility] wire. Rosen wire was then was removed and a stiff Glidewire was then used. The tip of the Uni fuse catheter was slowly advanced into the origin of the posterior tibial artery. Glidewire was removed, limited angiogram was performed, and then the obturator wire was placed. Patient tolerated the procedure well. Patient remained hemodynamically stable throughout. No complications were encountered.  No significant blood loss FINDINGS: Angiogram  demonstrates occlusion of the stent system of the distal superficial femoral artery an the popliteal artery. There is no significant reconstitution of flow into the distal tibial arteries, however, patency of the distal popliteal artery, tibioperoneal trunk, and the proximal 1/3 of the anterior tibial artery was evident. The proximal posterior tibial artery was occluded. The anterior tibial artery is again occluded proximally, which was the case on the prior angiogram. There is critical stenosis at the origin of the peroneal artery. After placement of the infusion catheter, injection demonstrates partial filling of the posterior tibial artery. IMPRESSION: Status post ultrasound guided access of right common femoral artery and placement of a 40 cm length infusion catheter across acute thrombosis of the left superficial femoral artery and popliteal artery for initiation of thrombolysis. Signed, Dulcy Fanny. Earleen Newport, DO [facility] Radiology Electronically Signed   By: Corrie Mckusick D.O.   On: 04/08/2017 13:41   Dg Chest Port 1 View  Result Date: 04/17/2017 CLINICAL DATA:  Cough. EXAM: PORTABLE CHEST 1 VIEW COMPARISON:  CT 03/04/2017. FINDINGS: Mediastinum hilar structures normal. Cardiomegaly with normal pulmonary vascularity. Low lung volumes with mild basilar atelectasis. Small left pleural effusion cannot be excluded. No pneumothorax. Degenerative changes both shoulders and thoracic spine . IMPRESSION: Low lung volumes with mild basilar atelectasis. Small left pleural effusion cannot be excluded. Electronically Signed   By: Marcello Moores  Register   On: 04/17/2017 11:20   Dg Foot Complete Left  Result Date: 04/21/2017 Please see detailed radiograph report in office note.  Ir Infusion Thrombol Arterial Initial (Tiffany)  Result Date: 04/08/2017 INDICATION: 83 year old female with acute thrombosis of left superficial femoral artery stent system. SFA and popliteal stent  system placed 03/25/2017 for treatment of critical limb ischemia and nonhealing wound. She returns today for initiation of thrombolysis and treatment EXAM: ULTRASOUND GUIDED ACCESS RIGHT COMMON FEMORAL ARTERY ANGIOGRAM LEFT LOWER EXTREMITY INITIATION OF ARTERIAL THROMBOLYSIS WITH CATHETER DIRECTED THERAPY MEDICATIONS: None ANESTHESIA/SEDATION: Moderate (conscious) sedation was employed during this procedure. A total of Versed 2.0 mg and Fentanyl 100 mcg was administered intravenously. Moderate Sedation Time: 45 minutes. The  patient's level of consciousness and vital signs were monitored continuously by radiology nursing throughout the procedure under my direct supervision. CONTRAST:  40 cc Isovue FLUOROSCOPY TIME:  Fluoroscopy Time: 10 minutes 24 seconds (67 mGy). COMPLICATIONS: None PROCEDURE: Informed consent was obtained from the patient following explanation of the procedure, risks, benefits and alternatives. The patient understands, agrees and consents for the procedure. All questions were addressed. A time out was performed prior to the initiation of the procedure. Maximal barrier sterile technique utilized including caps, mask, sterile gowns, sterile gloves, large sterile drape, hand hygiene, and Betadine prep. Ultrasound survey of the right inguinal region was performed with images stored and sent to PACs. A micropuncture needle was used access the right common femoral artery under ultrasound. With excellent arterial blood flow returned, and an .018 micro wire was passed through the needle, observed enter the abdominal aorta under fluoroscopy. The needle was removed, and a micropuncture sheath was placed over the wire. The inner dilator and wire were removed, and an 035 Bentson wire was advanced under fluoroscopy into the abdominal aorta. The sheath was removed and a standard 5 [place] vascular sheath was placed. The dilator was removed and the sheath was flushed. Omni Flush catheter was advanced over the  Bentson wire into the abdominal aorta and the Bentson wire was retracted to form the curve on the catheter. Catheter and wire were then directed down the contralateral common iliac artery. The wire was advanced to the superficial femoral artery. Catheter was removed an a 5 French vertebral catheter was placed. Angiogram of the left lower extremity was then performed. Rosen wire was then passed through the catheter into the superficial femoral artery in the catheter was removed. Sheath was removed. A 6 French 45 cm Arrow braided sheath was then placed over the wire into the distal external iliac artery. Vertebral catheter was then placed over the Rosen wire in the [place] wire was removed. Angiogram was performed. Combination of Rosen wire and the vertebral catheter were then used to traverse the proximal 2/3 of the thrombosed superficial femoral artery. When the catheter reached the popliteal artery there was significant resistance. Vertebral catheter was removed and a Uni fuse infusion catheter, 135 cm length, 40 cm infusion length was selected. This was placed over the [facility] wire. Rosen wire was then was removed and a stiff Glidewire was then used. The tip of the Uni fuse catheter was slowly advanced into the origin of the posterior tibial artery. Glidewire was removed, limited angiogram was performed, and then the obturator wire was placed. Patient tolerated the procedure well. Patient remained hemodynamically stable throughout. No complications were encountered.  No significant blood loss FINDINGS: Angiogram demonstrates occlusion of the stent system of the distal superficial femoral artery an the popliteal artery. There is no significant reconstitution of flow into the distal tibial arteries, however, patency of the distal popliteal artery, tibioperoneal trunk, and the proximal 1/3 of the anterior tibial artery was evident. The proximal posterior tibial artery was occluded. The anterior tibial artery is again  occluded proximally, which was the case on the prior angiogram. There is critical stenosis at the origin of the peroneal artery. After placement of the infusion catheter, injection demonstrates partial filling of the posterior tibial artery. IMPRESSION: Status post ultrasound guided access of right common femoral artery and placement of a 40 cm length infusion catheter across acute thrombosis of the left superficial femoral artery and popliteal artery for initiation of thrombolysis. Signed, Dulcy Fanny. Earleen Newport, DO [facility] Electronically Signed   By: Corrie Mckusick D.O.   On: 04/08/2017 13:41   Ir Jacolyn Reedy F/u Eval Art/ven Final Day (Tiffany)  Result Date: 04/09/2017 INDICATION: Chronic left limb ischemia, status post revascularization of chronic total occlusion of popliteal artery through proximal posterior tibial artery 03/25/2017. Patient presented with distal occlusion, and is status post overnight catheter directed thrombolytic infusion. EXAM: LEFT LOWER EXTREMITY ARTERIOGRAM THROUGH EXISTING CATHETER MEDICATIONS: None required ANESTHESIA/SEDATION: None required CONTRAST:  60 mL Isovue-300 FLUOROSCOPY TIME:  3 minutes, 57 mGy COMPLICATIONS: None immediate. PROCEDURE: Informed consent was obtained from the patient following explanation of the procedure, risks, benefits and alternatives. The patient understands, agrees and consents for the procedure. All questions were addressed. A time out was performed prior to the initiation of the procedure. Contrast was injected through previously placed the right femoral sheath for left lower extremity arteriography. At the conclusion of the procedure, the infusion catheter was removed. The patient tolerated the procedure well. FINDINGS: Patent left profunda femoris. Atheromatous SFA with tandem areas of mild to moderate stenosis in its mid portion. Clearance of thrombus from the popliteal arterial stent. No residual  thrombus or stenosis. Origin occlusion of the anterior tibial artery. Short-segment moderate stenosis just beyond the origin of the tibioperoneal trunk. Patent posterior tibial artery to just below the distal calf above the ankle as before ; no direct outflow to the plantar arch. Short-segment origin stenosis of the peroneal artery of at least moderate severity, patent distally with a prominent anterior communicating branch crossing the ankle. Reconstitution of the distal anterior tibial artery the mid calf level, patent across the ankle as dorsalis pedis. IMPRESSION: 1. Interval complete clearance of acute left lower extremity arterial thrombus after overnight catheter directed thrombolytic infusion. 2. Restoration of inline flow through the posterior tibial artery to the ankle. 3. Tandem stenoses in the mid SFA and tibioperoneal trunk. 4. Collateral reconstitution of peroneal and anterior tibial distal runoff as above. Electronically Signed   By: Lucrezia Europe M.D.   On: 04/09/2017 15:02    Labs:  CBC:  Recent Labs  04/12/17 1452 04/12/17 1628 04/13/17 0022 04/14/17 0946 04/15/17 1410  WBC 14.1*  --  20.1* 15.5* 11.7*  HGB 10.6* 10.5* 11.7* 9.8* 10.0*  HCT 33.1* 31.0* 35.2* 30.1* 31.2*  PLT 239  --  183 180 207    COAGS:  Recent Labs  03/25/17 0645  04/14/17 0244 04/15/17 0157 04/16/17 0213 04/17/17 0207  INR 1.16  < > 1.97 1.44 1.20 1.25  APTT 32  --   --   --   --   --   < > = values in this interval not displayed.  BMP:  Recent Labs  04/07/17 0850 04/12/17 1452 04/12/17 1628 04/13/17 0022 04/15/17 0157  NA 138 140 142 135 137  K 3.6 3.7 4.8 4.5 3.8  CL 101 106  --  105 107  CO2 25 22  --  22 24  GLUCOSE 314* 309* 342* 354* 153*  BUN 16 16  --  24* 20  CALCIUM 9.0 9.1  --  8.5* 8.2*  CREATININE 0.83 1.05*  --  1.25* 0.83  GFRNONAA >60 47*  --  38* >60  GFRAA >60 54*  --  44* >60    LIVER FUNCTION TESTS:  Recent Labs  03/18/17 1138 04/15/17 0157  BILITOT  1.5* 1.1  AST 23 23  ALT 24 22  ALKPHOS 77 46  PROT 6.8 4.4*  ALBUMIN 3.5  2.4*    TUMOR MARKERS: No results for input(s): AFPTM, CEA, CA199, CHROMGRNA in the last 8760 hours.  Assessment and Plan:  83 yo female with critical limb ischemia of the left lower extremity, Rutherford 5 class symptoms.    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    She then had complication of right thigh spontaneous hemorrhage 04/12/2017, most likely secondary to combination of therapeutic INR (Coumadin) and anti-platelet medication.  This required hospitalization and emergent surgical evacuation with femoral artery repai 5/26.    She has now been recovery at [facility], with stable left foot wound and left foot well perfused.   The non-invasive exam performed today shows left TBI of 0.4 (noncompressible ABI).  Monophasic waveform suggest to me that we may have had some compromise of the tibial flow during her recent events.  At any rate, she is well perfused with slow interval healing at the wounds.    I do not think I would push for any repeat angiogram/angioplasty at this time.  I did let them know that should she encounter a plateau or regression with healing, we can consider additional treatment.  For now I would observe, and advised her to observe her scheduled appointments.  They understand and agree with this plan.   Plan:  - 3 month follow up appointment to observe wound healing.  - Wound consider removing ASA daily, and continuing Plavix and anti-coagulation, to decrease bleeding risk.  - Continue dry dressing changes at the right hip, in clean environment with the right hip straight during changes.  - Continue current wound care per [name].  - Wound advise continuing to float both heels at night time.  - Observe future appointments with her providers    Electronically Signed: Corrie Mckusick 05/01/2017, 11:18 AM   I spent a total of    25 Minutes in face to face in clinical consultation, greater than 50% of which was counseling/coordinating care for critical limb ischemia, left foot wound, left popliteal chronic total occlusion.

## 2017-05-02 ENCOUNTER — Encounter: Payer: Self-pay | Admitting: Interventional Radiology

## 2017-05-07 ENCOUNTER — Ambulatory Visit (INDEPENDENT_AMBULATORY_CARE_PROVIDER_SITE_OTHER): Payer: Medicare Other | Admitting: Podiatry

## 2017-05-07 DIAGNOSIS — R600 Localized edema: Secondary | ICD-10-CM | POA: Diagnosis not present

## 2017-05-07 DIAGNOSIS — L97529 Non-pressure chronic ulcer of other part of left foot with unspecified severity: Secondary | ICD-10-CM

## 2017-05-07 DIAGNOSIS — L97522 Non-pressure chronic ulcer of other part of left foot with fat layer exposed: Secondary | ICD-10-CM

## 2017-05-07 DIAGNOSIS — I83025 Varicose veins of left lower extremity with ulcer other part of foot: Secondary | ICD-10-CM | POA: Diagnosis not present

## 2017-05-11 NOTE — Progress Notes (Signed)
   Subjective:  Patient presents today for follow-up evaluation and treatment of the third toe amputation left foot. Date of surgery 02/24/2017. She states her condition is unchanged and is still having soreness to the left lateral ankle and foot. Elevating the area alleviate the pain.    Objective/Physical Exam General: The patient is alert and oriented x3 in no acute distress.  Dermatology:  Wound #1 noted to the second digit amputation stump measuring 1.3 x 0.8 x 0.1 cm secondary to venous insufficiency (LxWxD).   Wound #2 noted to the left lateral ankle measuring 0.3 x 0.3 x 0.2 cm secondary to venous insufficiency.  To the noted ulceration(s), there is no eschar. There is a moderate amount of slough, fibrin, and necrotic tissue noted. Moderate amount of purulent drainage also noted. Granulation tissue and wound base is pink.  There is no exposed bone muscle-tendon ligament or joint. There is no malodor. Periwound integrity is intact. Skin is warm, dry and supple bilateral lower extremities.  Vascular: Ecchymosis with edema of the right lower extremity. Diminished pedal pulses bilaterally. No erythema noted. Capillary refill delayed.  Neurological: Epicritic and protective threshold diminished bilaterally.   Musculoskeletal: Amputation site of the third digit left foot appears stable. There is some maceration noted to the amputation site.  Radiographic Exam: evidence of stable second toe amputation.  Assessment: #1 status post left second digit amputation. DOS: 02/24/17. #2 left lower extremity ulcers secondary to venous insufficiency   Plan of Care:  #1 Patient was evaluated. #2 medically necessary excisional debridement including subcutaneous tissue was performed using a tissue nipper and a chisel blade. Excisional debridement of all the necrotic nonviable tissue down to healthy bleeding viable tissue was performed with post-debridement measurements same as pre-. #3 the wound was  cleansed and dry sterile dressing applied. #4 Unna boot compression wraps for bilateral lower extremity edema #5 has appointment with Dr. Donnetta Hutching, VVI, on 05/13/17. #6 return to clinic in 3 weeks.   [name], DPM [facility]  Dr. [name], [place]                                        [place], [place] [address]                Office [phone]  Fax [phone]

## 2017-05-13 ENCOUNTER — Ambulatory Visit (INDEPENDENT_AMBULATORY_CARE_PROVIDER_SITE_OTHER): Payer: Self-pay | Admitting: Vascular Surgery

## 2017-05-13 ENCOUNTER — Encounter: Payer: Self-pay | Admitting: Vascular Surgery

## 2017-05-13 VITALS — BP 173/103 | HR 78 | Temp 97.4°F | Resp 18 | Ht 62.0 in | Wt 115.0 lb

## 2017-05-13 DIAGNOSIS — Z9889 Other specified postprocedural states: Secondary | ICD-10-CM

## 2017-05-13 NOTE — Progress Notes (Signed)
Patient name: [name] MRN: [id number] DOB: 1928/06/24 Sex: female  REASON FOR VISIT: Follow-up evacuation of hematoma  HPI: [name] is a 83 y.o. female reason emergency room with a very large hematoma her right groin and hemodynamic instability on 04/12/2017. She had treatment of a left superficial femoral artery occlusion with stenting by interventional radiology earlier. She was taken immediately to the operating room where she underwent control bleeding from the femoral artery and evacuation hematoma and drain placement. She is here today for continued follow-up. She has ongoing follow-up with podiatry for open third toe amputation and potential bradycardia interventional radiology regarding her left leg ischemia. She does report some mild rest pain but does appear to have healing of her foot.  Current Outpatient Prescriptions  Medication Sig Dispense Refill  . acetaminophen (TYLENOL) 325 MG tablet Take 650 mg by mouth daily.     Marland Kitchen aspirin EC 325 MG tablet Take 325 mg by mouth at bedtime.    . calcium carbonate (OSCAL) 1500 (600 Ca) MG TABS tablet Take 1 tablet by mouth daily with breakfast.     . clopidogrel (PLAVIX) 75 MG tablet Take 1 tablet (75 mg total) by mouth daily. 30 tablet 1  . docusate sodium (COLACE) 100 MG capsule Take 1 capsule (100 mg total) by mouth daily. 10 capsule 0  . ferrous sulfate 325 (65 FE) MG tablet Take 325 mg by mouth daily with breakfast.    . magnesium hydroxide (MILK OF MAGNESIA) 400 MG/5ML suspension Take 45 mLs by mouth See admin instructions. AS NEEDED FOR 2 DAYS TO STOP ON 04/13/17    . metoprolol succinate (TOPROL-XL) 25 MG 24 hr tablet Take 25 mg by mouth at bedtime.     . Multiple Vitamin (MULTIVITAMIN) tablet Take 1 tablet by mouth daily.    . pantoprazole (PROTONIX) 40 MG tablet Take 1 tablet (40 mg total) by mouth daily. 30 tablet 1  . traMADol (ULTRAM) 50 MG tablet Take 1 mg by mouth every 6 (six) hours  as needed.    . vitamin B-12 (CYANOCOBALAMIN) 1000 MCG tablet Take 1,000 mcg by mouth daily.    . Vitamin D, Ergocalciferol, (DRISDOL) 50000 units CAPS capsule Take 50,000 Units by mouth every 7 (seven) days. Sundays    . warfarin (COUMADIN) 3 MG tablet Take 1 tablet (3 mg total) by mouth daily at 6 PM. FOR A-FIB 30 tablet 1  . guaiFENesin-dextromethorphan (ROBITUSSIN DM) 100-10 MG/5ML syrup Take 15 mLs by mouth every 4 (four) hours as needed for cough. (Patient not taking: Reported on 05/01/2017) 118 mL 0  . oxyCODONE-acetaminophen (PERCOCET/ROXICET) 5-325 MG tablet Take 1 tablet by mouth every 8 (eight) hours as needed for moderate pain. (Patient not taking: Reported on 05/01/2017) 10 tablet 0   No current facility-administered medications for this visit.      PHYSICAL EXAM: Vitals:   05/13/17 1223 05/13/17 1227  BP: (!) 177/96 (!) 173/103  Pulse: 78   Resp: 18   Temp: 97.4 F (36.3 C)   TempSrc: Oral   SpO2: 98%   Weight: 115 lb (52.2 kg)   Height: 5\' 2"  (1.575 m)     GENERAL: The patient is a well-nourished female, in no acute distress. The vital signs are documented above. I do not palpate popliteal pulses bilaterally. She does have open dictation of her left third toe and a small ulceration over the lateral malleolus. Both these appear to be healing. Also has a great deal of edema in  both lower femoris from her knees distally. Her right groin wound is healed. She does have an area approximately less than 1 cm in length and 1-2 mm in width that is not completely closed in her groin. No evidence of fluid collection with minimal bruising currently  MEDICAL ISSUES: Stable overall. Evacuation hematoma. She will continue follow-up with podiatry and interventional radiology. We are available if she has any other issues regarding her lower extremity ischemia. We have a placed Ace wrap today over her lower extremities bilaterally from her feet up to her knees to control the  swelling.   Rosetta Posner, MD FACS [facility] Tel [phone] Pager [phone]

## 2017-05-14 DIAGNOSIS — I998 Other disorder of circulatory system: Secondary | ICD-10-CM | POA: Diagnosis not present

## 2017-05-14 DIAGNOSIS — I10 Essential (primary) hypertension: Secondary | ICD-10-CM | POA: Diagnosis not present

## 2017-05-14 DIAGNOSIS — D649 Anemia, unspecified: Secondary | ICD-10-CM | POA: Diagnosis not present

## 2017-05-14 DIAGNOSIS — I4891 Unspecified atrial fibrillation: Secondary | ICD-10-CM | POA: Diagnosis not present

## 2017-05-17 DIAGNOSIS — I4891 Unspecified atrial fibrillation: Secondary | ICD-10-CM | POA: Diagnosis not present

## 2017-05-17 DIAGNOSIS — I739 Peripheral vascular disease, unspecified: Secondary | ICD-10-CM | POA: Diagnosis not present

## 2017-05-17 DIAGNOSIS — M1990 Unspecified osteoarthritis, unspecified site: Secondary | ICD-10-CM | POA: Diagnosis not present

## 2017-05-17 DIAGNOSIS — D649 Anemia, unspecified: Secondary | ICD-10-CM | POA: Diagnosis not present

## 2017-05-17 DIAGNOSIS — Z89422 Acquired absence of other left toe(s): Secondary | ICD-10-CM | POA: Diagnosis not present

## 2017-05-17 DIAGNOSIS — I10 Essential (primary) hypertension: Secondary | ICD-10-CM | POA: Diagnosis not present

## 2017-05-17 DIAGNOSIS — Z7982 Long term (current) use of aspirin: Secondary | ICD-10-CM | POA: Diagnosis not present

## 2017-05-17 DIAGNOSIS — D6489 Other specified anemias: Secondary | ICD-10-CM | POA: Diagnosis not present

## 2017-05-17 DIAGNOSIS — Z7902 Long term (current) use of antithrombotics/antiplatelets: Secondary | ICD-10-CM | POA: Diagnosis not present

## 2017-05-17 DIAGNOSIS — Z9181 History of falling: Secondary | ICD-10-CM | POA: Diagnosis not present

## 2017-05-17 DIAGNOSIS — I7389 Other specified peripheral vascular diseases: Secondary | ICD-10-CM | POA: Diagnosis not present

## 2017-05-17 DIAGNOSIS — Z7901 Long term (current) use of anticoagulants: Secondary | ICD-10-CM | POA: Diagnosis not present

## 2017-05-17 DIAGNOSIS — Z4781 Encounter for orthopedic aftercare following surgical amputation: Secondary | ICD-10-CM | POA: Diagnosis not present

## 2017-05-19 DIAGNOSIS — M1990 Unspecified osteoarthritis, unspecified site: Secondary | ICD-10-CM | POA: Diagnosis not present

## 2017-05-19 DIAGNOSIS — I4891 Unspecified atrial fibrillation: Secondary | ICD-10-CM | POA: Diagnosis not present

## 2017-05-19 DIAGNOSIS — I739 Peripheral vascular disease, unspecified: Secondary | ICD-10-CM | POA: Diagnosis not present

## 2017-05-19 DIAGNOSIS — I10 Essential (primary) hypertension: Secondary | ICD-10-CM | POA: Diagnosis not present

## 2017-05-19 DIAGNOSIS — D649 Anemia, unspecified: Secondary | ICD-10-CM | POA: Diagnosis not present

## 2017-05-19 DIAGNOSIS — Z4781 Encounter for orthopedic aftercare following surgical amputation: Secondary | ICD-10-CM | POA: Diagnosis not present

## 2017-05-22 DIAGNOSIS — M1990 Unspecified osteoarthritis, unspecified site: Secondary | ICD-10-CM | POA: Diagnosis not present

## 2017-05-22 DIAGNOSIS — I4891 Unspecified atrial fibrillation: Secondary | ICD-10-CM | POA: Diagnosis not present

## 2017-05-22 DIAGNOSIS — I10 Essential (primary) hypertension: Secondary | ICD-10-CM | POA: Diagnosis not present

## 2017-05-22 DIAGNOSIS — D649 Anemia, unspecified: Secondary | ICD-10-CM | POA: Diagnosis not present

## 2017-05-22 DIAGNOSIS — I739 Peripheral vascular disease, unspecified: Secondary | ICD-10-CM | POA: Diagnosis not present

## 2017-05-22 DIAGNOSIS — Z4781 Encounter for orthopedic aftercare following surgical amputation: Secondary | ICD-10-CM | POA: Diagnosis not present

## 2017-05-23 DIAGNOSIS — I10 Essential (primary) hypertension: Secondary | ICD-10-CM | POA: Diagnosis not present

## 2017-05-23 DIAGNOSIS — I4891 Unspecified atrial fibrillation: Secondary | ICD-10-CM | POA: Diagnosis not present

## 2017-05-23 DIAGNOSIS — Z4781 Encounter for orthopedic aftercare following surgical amputation: Secondary | ICD-10-CM | POA: Diagnosis not present

## 2017-05-23 DIAGNOSIS — M1990 Unspecified osteoarthritis, unspecified site: Secondary | ICD-10-CM | POA: Diagnosis not present

## 2017-05-23 DIAGNOSIS — I739 Peripheral vascular disease, unspecified: Secondary | ICD-10-CM | POA: Diagnosis not present

## 2017-05-23 DIAGNOSIS — D649 Anemia, unspecified: Secondary | ICD-10-CM | POA: Diagnosis not present

## 2017-05-26 DIAGNOSIS — D649 Anemia, unspecified: Secondary | ICD-10-CM | POA: Diagnosis not present

## 2017-05-26 DIAGNOSIS — I739 Peripheral vascular disease, unspecified: Secondary | ICD-10-CM | POA: Diagnosis not present

## 2017-05-26 DIAGNOSIS — I4891 Unspecified atrial fibrillation: Secondary | ICD-10-CM | POA: Diagnosis not present

## 2017-05-26 DIAGNOSIS — M1990 Unspecified osteoarthritis, unspecified site: Secondary | ICD-10-CM | POA: Diagnosis not present

## 2017-05-26 DIAGNOSIS — Z4781 Encounter for orthopedic aftercare following surgical amputation: Secondary | ICD-10-CM | POA: Diagnosis not present

## 2017-05-26 DIAGNOSIS — I10 Essential (primary) hypertension: Secondary | ICD-10-CM | POA: Diagnosis not present

## 2017-05-27 DIAGNOSIS — Z4781 Encounter for orthopedic aftercare following surgical amputation: Secondary | ICD-10-CM | POA: Diagnosis not present

## 2017-05-27 DIAGNOSIS — D649 Anemia, unspecified: Secondary | ICD-10-CM | POA: Diagnosis not present

## 2017-05-27 DIAGNOSIS — Z1389 Encounter for screening for other disorder: Secondary | ICD-10-CM | POA: Diagnosis not present

## 2017-05-27 DIAGNOSIS — R2689 Other abnormalities of gait and mobility: Secondary | ICD-10-CM | POA: Diagnosis not present

## 2017-05-27 DIAGNOSIS — R0609 Other forms of dyspnea: Secondary | ICD-10-CM | POA: Diagnosis not present

## 2017-05-27 DIAGNOSIS — I739 Peripheral vascular disease, unspecified: Secondary | ICD-10-CM | POA: Diagnosis not present

## 2017-05-27 DIAGNOSIS — M1990 Unspecified osteoarthritis, unspecified site: Secondary | ICD-10-CM | POA: Diagnosis not present

## 2017-05-27 DIAGNOSIS — Z6822 Body mass index (BMI) 22.0-22.9, adult: Secondary | ICD-10-CM | POA: Diagnosis not present

## 2017-05-27 DIAGNOSIS — R0602 Shortness of breath: Secondary | ICD-10-CM | POA: Diagnosis not present

## 2017-05-27 DIAGNOSIS — I4891 Unspecified atrial fibrillation: Secondary | ICD-10-CM | POA: Diagnosis not present

## 2017-05-27 DIAGNOSIS — I10 Essential (primary) hypertension: Secondary | ICD-10-CM | POA: Diagnosis not present

## 2017-05-27 DIAGNOSIS — E1151 Type 2 diabetes mellitus with diabetic peripheral angiopathy without gangrene: Secondary | ICD-10-CM | POA: Diagnosis not present

## 2017-05-27 DIAGNOSIS — I7389 Other specified peripheral vascular diseases: Secondary | ICD-10-CM | POA: Diagnosis not present

## 2017-05-27 DIAGNOSIS — Z7901 Long term (current) use of anticoagulants: Secondary | ICD-10-CM | POA: Diagnosis not present

## 2017-05-28 ENCOUNTER — Encounter: Payer: Self-pay | Admitting: Podiatry

## 2017-05-28 ENCOUNTER — Ambulatory Visit (INDEPENDENT_AMBULATORY_CARE_PROVIDER_SITE_OTHER): Payer: Medicare Other | Admitting: Podiatry

## 2017-05-28 DIAGNOSIS — L97522 Non-pressure chronic ulcer of other part of left foot with fat layer exposed: Secondary | ICD-10-CM

## 2017-05-28 DIAGNOSIS — L97529 Non-pressure chronic ulcer of other part of left foot with unspecified severity: Secondary | ICD-10-CM

## 2017-05-28 DIAGNOSIS — I83025 Varicose veins of left lower extremity with ulcer other part of foot: Secondary | ICD-10-CM

## 2017-05-29 DIAGNOSIS — M1990 Unspecified osteoarthritis, unspecified site: Secondary | ICD-10-CM | POA: Diagnosis not present

## 2017-05-29 DIAGNOSIS — I4891 Unspecified atrial fibrillation: Secondary | ICD-10-CM | POA: Diagnosis not present

## 2017-05-29 DIAGNOSIS — I739 Peripheral vascular disease, unspecified: Secondary | ICD-10-CM | POA: Diagnosis not present

## 2017-05-29 DIAGNOSIS — D649 Anemia, unspecified: Secondary | ICD-10-CM | POA: Diagnosis not present

## 2017-05-29 DIAGNOSIS — Z4781 Encounter for orthopedic aftercare following surgical amputation: Secondary | ICD-10-CM | POA: Diagnosis not present

## 2017-05-29 DIAGNOSIS — I10 Essential (primary) hypertension: Secondary | ICD-10-CM | POA: Diagnosis not present

## 2017-05-30 DIAGNOSIS — I10 Essential (primary) hypertension: Secondary | ICD-10-CM | POA: Diagnosis not present

## 2017-05-30 DIAGNOSIS — D649 Anemia, unspecified: Secondary | ICD-10-CM | POA: Diagnosis not present

## 2017-05-30 DIAGNOSIS — M1990 Unspecified osteoarthritis, unspecified site: Secondary | ICD-10-CM | POA: Diagnosis not present

## 2017-05-30 DIAGNOSIS — Z4781 Encounter for orthopedic aftercare following surgical amputation: Secondary | ICD-10-CM | POA: Diagnosis not present

## 2017-05-30 DIAGNOSIS — I739 Peripheral vascular disease, unspecified: Secondary | ICD-10-CM | POA: Diagnosis not present

## 2017-05-30 DIAGNOSIS — I4891 Unspecified atrial fibrillation: Secondary | ICD-10-CM | POA: Diagnosis not present

## 2017-06-02 DIAGNOSIS — I739 Peripheral vascular disease, unspecified: Secondary | ICD-10-CM | POA: Diagnosis not present

## 2017-06-02 DIAGNOSIS — I4891 Unspecified atrial fibrillation: Secondary | ICD-10-CM | POA: Diagnosis not present

## 2017-06-02 DIAGNOSIS — I10 Essential (primary) hypertension: Secondary | ICD-10-CM | POA: Diagnosis not present

## 2017-06-02 DIAGNOSIS — M1990 Unspecified osteoarthritis, unspecified site: Secondary | ICD-10-CM | POA: Diagnosis not present

## 2017-06-02 DIAGNOSIS — Z4781 Encounter for orthopedic aftercare following surgical amputation: Secondary | ICD-10-CM | POA: Diagnosis not present

## 2017-06-02 DIAGNOSIS — D649 Anemia, unspecified: Secondary | ICD-10-CM | POA: Diagnosis not present

## 2017-06-03 DIAGNOSIS — M1990 Unspecified osteoarthritis, unspecified site: Secondary | ICD-10-CM | POA: Diagnosis not present

## 2017-06-03 DIAGNOSIS — Z4781 Encounter for orthopedic aftercare following surgical amputation: Secondary | ICD-10-CM | POA: Diagnosis not present

## 2017-06-03 DIAGNOSIS — I10 Essential (primary) hypertension: Secondary | ICD-10-CM | POA: Diagnosis not present

## 2017-06-03 DIAGNOSIS — I4891 Unspecified atrial fibrillation: Secondary | ICD-10-CM | POA: Diagnosis not present

## 2017-06-03 DIAGNOSIS — D649 Anemia, unspecified: Secondary | ICD-10-CM | POA: Diagnosis not present

## 2017-06-03 DIAGNOSIS — I739 Peripheral vascular disease, unspecified: Secondary | ICD-10-CM | POA: Diagnosis not present

## 2017-06-05 DIAGNOSIS — I10 Essential (primary) hypertension: Secondary | ICD-10-CM | POA: Diagnosis not present

## 2017-06-05 DIAGNOSIS — D649 Anemia, unspecified: Secondary | ICD-10-CM | POA: Diagnosis not present

## 2017-06-05 DIAGNOSIS — I739 Peripheral vascular disease, unspecified: Secondary | ICD-10-CM | POA: Diagnosis not present

## 2017-06-05 DIAGNOSIS — Z4781 Encounter for orthopedic aftercare following surgical amputation: Secondary | ICD-10-CM | POA: Diagnosis not present

## 2017-06-05 DIAGNOSIS — M1990 Unspecified osteoarthritis, unspecified site: Secondary | ICD-10-CM | POA: Diagnosis not present

## 2017-06-05 DIAGNOSIS — I4891 Unspecified atrial fibrillation: Secondary | ICD-10-CM | POA: Diagnosis not present

## 2017-06-08 NOTE — Progress Notes (Signed)
   Subjective:  Patient presents today for follow-up evaluation and treatment of the third toe amputation left foot. Date of surgery 02/24/2017. Patient states that the swelling has improved in her lower extremities bilaterally. She believes the ulcers have healed significantly and she feels much better.   Objective/Physical Exam General: The patient is alert and oriented x3 in no acute distress.  Dermatology:  Wound #1 noted to the second digit amputation stump measuring 1.1 x 0.7 x 0.1 cm secondary to venous insufficiency (LxWxD).   Wound #2 noted to the left lateral ankle measuring 0.2 x 0.2 x 0.2 cm secondary to venous insufficiency.  To the noted ulceration(s), there is no eschar. There is a moderate amount of slough, fibrin, and necrotic tissue noted. Moderate amount of purulent drainage also noted. Granulation tissue and wound base is pink.  There is no exposed bone muscle-tendon ligament or joint. There is no malodor. Periwound integrity is intact. Skin is warm, dry and supple bilateral lower extremities.  Vascular: Ecchymosis with edema of the right lower extremity. This appears to be improved over last visit Diminished pedal pulses bilaterally. No erythema noted. Capillary refill delayed.  Neurological: Epicritic and protective threshold diminished bilaterally.   Musculoskeletal: Amputation site of the third digit left foot appears stable. There is some maceration noted to the amputation site.  Radiographic Exam: evidence of stable second toe amputation.  Assessment: #1 status post left second digit amputation. DOS: 02/24/17. #2 left lower extremity ulcers secondary to venous insufficiency   Plan of Care:  #1 Patient was evaluated. #2 medically necessary excisional debridement including subcutaneous tissue was performed using a tissue nipper and a chisel blade. Excisional debridement of all the necrotic nonviable tissue down to healthy bleeding viable tissue was performed with  post-debridement measurements same as pre-. #3 the wound was cleansed and dry sterile dressing applied. #4 the patient can return to diabetic shoe gear and slowly transition out of the postoperative shoe #5 return to clinic in 2 weeks  [name], DPM [facility]  Dr. [name], [place]                                        [place], [place] [address]                Office [phone]  Fax [phone]

## 2017-06-09 DIAGNOSIS — I739 Peripheral vascular disease, unspecified: Secondary | ICD-10-CM | POA: Diagnosis not present

## 2017-06-09 DIAGNOSIS — M1990 Unspecified osteoarthritis, unspecified site: Secondary | ICD-10-CM | POA: Diagnosis not present

## 2017-06-09 DIAGNOSIS — I4891 Unspecified atrial fibrillation: Secondary | ICD-10-CM | POA: Diagnosis not present

## 2017-06-09 DIAGNOSIS — D649 Anemia, unspecified: Secondary | ICD-10-CM | POA: Diagnosis not present

## 2017-06-09 DIAGNOSIS — Z4781 Encounter for orthopedic aftercare following surgical amputation: Secondary | ICD-10-CM | POA: Diagnosis not present

## 2017-06-09 DIAGNOSIS — I10 Essential (primary) hypertension: Secondary | ICD-10-CM | POA: Diagnosis not present

## 2017-06-10 DIAGNOSIS — D649 Anemia, unspecified: Secondary | ICD-10-CM | POA: Diagnosis not present

## 2017-06-10 DIAGNOSIS — Z4781 Encounter for orthopedic aftercare following surgical amputation: Secondary | ICD-10-CM | POA: Diagnosis not present

## 2017-06-10 DIAGNOSIS — I10 Essential (primary) hypertension: Secondary | ICD-10-CM | POA: Diagnosis not present

## 2017-06-10 DIAGNOSIS — I4891 Unspecified atrial fibrillation: Secondary | ICD-10-CM | POA: Diagnosis not present

## 2017-06-10 DIAGNOSIS — I739 Peripheral vascular disease, unspecified: Secondary | ICD-10-CM | POA: Diagnosis not present

## 2017-06-10 DIAGNOSIS — M1990 Unspecified osteoarthritis, unspecified site: Secondary | ICD-10-CM | POA: Diagnosis not present

## 2017-06-12 DIAGNOSIS — I739 Peripheral vascular disease, unspecified: Secondary | ICD-10-CM | POA: Diagnosis not present

## 2017-06-12 DIAGNOSIS — D649 Anemia, unspecified: Secondary | ICD-10-CM | POA: Diagnosis not present

## 2017-06-12 DIAGNOSIS — I4891 Unspecified atrial fibrillation: Secondary | ICD-10-CM | POA: Diagnosis not present

## 2017-06-12 DIAGNOSIS — M1990 Unspecified osteoarthritis, unspecified site: Secondary | ICD-10-CM | POA: Diagnosis not present

## 2017-06-12 DIAGNOSIS — I10 Essential (primary) hypertension: Secondary | ICD-10-CM | POA: Diagnosis not present

## 2017-06-12 DIAGNOSIS — Z4781 Encounter for orthopedic aftercare following surgical amputation: Secondary | ICD-10-CM | POA: Diagnosis not present

## 2017-06-13 DIAGNOSIS — I739 Peripheral vascular disease, unspecified: Secondary | ICD-10-CM | POA: Diagnosis not present

## 2017-06-13 DIAGNOSIS — D649 Anemia, unspecified: Secondary | ICD-10-CM | POA: Diagnosis not present

## 2017-06-13 DIAGNOSIS — I4891 Unspecified atrial fibrillation: Secondary | ICD-10-CM | POA: Diagnosis not present

## 2017-06-13 DIAGNOSIS — Z4781 Encounter for orthopedic aftercare following surgical amputation: Secondary | ICD-10-CM | POA: Diagnosis not present

## 2017-06-13 DIAGNOSIS — M1990 Unspecified osteoarthritis, unspecified site: Secondary | ICD-10-CM | POA: Diagnosis not present

## 2017-06-13 DIAGNOSIS — I10 Essential (primary) hypertension: Secondary | ICD-10-CM | POA: Diagnosis not present

## 2017-06-16 DIAGNOSIS — Z4781 Encounter for orthopedic aftercare following surgical amputation: Secondary | ICD-10-CM | POA: Diagnosis not present

## 2017-06-16 DIAGNOSIS — D649 Anemia, unspecified: Secondary | ICD-10-CM | POA: Diagnosis not present

## 2017-06-16 DIAGNOSIS — I739 Peripheral vascular disease, unspecified: Secondary | ICD-10-CM | POA: Diagnosis not present

## 2017-06-16 DIAGNOSIS — I10 Essential (primary) hypertension: Secondary | ICD-10-CM | POA: Diagnosis not present

## 2017-06-16 DIAGNOSIS — I4891 Unspecified atrial fibrillation: Secondary | ICD-10-CM | POA: Diagnosis not present

## 2017-06-16 DIAGNOSIS — M1990 Unspecified osteoarthritis, unspecified site: Secondary | ICD-10-CM | POA: Diagnosis not present

## 2017-06-17 DIAGNOSIS — I4891 Unspecified atrial fibrillation: Secondary | ICD-10-CM | POA: Diagnosis not present

## 2017-06-17 DIAGNOSIS — I739 Peripheral vascular disease, unspecified: Secondary | ICD-10-CM | POA: Diagnosis not present

## 2017-06-17 DIAGNOSIS — D649 Anemia, unspecified: Secondary | ICD-10-CM | POA: Diagnosis not present

## 2017-06-17 DIAGNOSIS — Z4781 Encounter for orthopedic aftercare following surgical amputation: Secondary | ICD-10-CM | POA: Diagnosis not present

## 2017-06-17 DIAGNOSIS — I10 Essential (primary) hypertension: Secondary | ICD-10-CM | POA: Diagnosis not present

## 2017-06-17 DIAGNOSIS — M1990 Unspecified osteoarthritis, unspecified site: Secondary | ICD-10-CM | POA: Diagnosis not present

## 2017-06-18 ENCOUNTER — Ambulatory Visit (INDEPENDENT_AMBULATORY_CARE_PROVIDER_SITE_OTHER): Payer: Medicare Other | Admitting: Podiatry

## 2017-06-18 ENCOUNTER — Encounter: Payer: Self-pay | Admitting: Podiatry

## 2017-06-18 DIAGNOSIS — L97529 Non-pressure chronic ulcer of other part of left foot with unspecified severity: Secondary | ICD-10-CM

## 2017-06-18 DIAGNOSIS — L97322 Non-pressure chronic ulcer of left ankle with fat layer exposed: Secondary | ICD-10-CM

## 2017-06-18 DIAGNOSIS — L97522 Non-pressure chronic ulcer of other part of left foot with fat layer exposed: Secondary | ICD-10-CM | POA: Diagnosis not present

## 2017-06-18 DIAGNOSIS — I83025 Varicose veins of left lower extremity with ulcer other part of foot: Secondary | ICD-10-CM

## 2017-06-18 MED ORDER — GENTAMICIN SULFATE 0.1 % EX CREA: 1.0000 "application " | TOPICAL_CREAM | Freq: Three times a day (TID) | CUTANEOUS | 1 refills | Status: DC

## 2017-06-18 MED ORDER — AMOXICILLIN-POT CLAVULANATE 500-125 MG PO TABS: 1.0000 | ORAL_TABLET | Freq: Two times a day (BID) | ORAL | 0 refills | Status: DC

## 2017-06-19 DIAGNOSIS — D649 Anemia, unspecified: Secondary | ICD-10-CM | POA: Diagnosis not present

## 2017-06-19 DIAGNOSIS — Z4781 Encounter for orthopedic aftercare following surgical amputation: Secondary | ICD-10-CM | POA: Diagnosis not present

## 2017-06-19 DIAGNOSIS — I739 Peripheral vascular disease, unspecified: Secondary | ICD-10-CM | POA: Diagnosis not present

## 2017-06-19 DIAGNOSIS — M1990 Unspecified osteoarthritis, unspecified site: Secondary | ICD-10-CM | POA: Diagnosis not present

## 2017-06-19 DIAGNOSIS — I10 Essential (primary) hypertension: Secondary | ICD-10-CM | POA: Diagnosis not present

## 2017-06-19 DIAGNOSIS — I4891 Unspecified atrial fibrillation: Secondary | ICD-10-CM | POA: Diagnosis not present

## 2017-06-20 NOTE — Progress Notes (Signed)
   Subjective:  Nondiabetic patient presents today for follow-up treatment and evaluation of ulcers to the left foot. Patient recently underwent surgical amputation of the third digit left foot on 02/24/2017. Patient also has complains of a very sore and tender ulcer to the lateral aspect of the left ankle. There is some slight yellow discharge noted from ankle ulceration. Patient presents today for further treatment and evaluation   Objective/Physical Exam General: The patient is alert and oriented x3 in no acute distress.  Dermatology:  Wound #1 noted to the second digit amputation stump measuring 0.60.3 x 0.1 cm secondary to venous insufficiency (LxWxD).   Wound #2 noted to the left lateral ankle measuring [phone] cm secondary to venous insufficiency.  To the noted ulceration(s), there is no eschar. There is a moderate amount of slough, fibrin, and necrotic tissue noted. Moderate amount of purulent drainage also noted to the lateral ankle ulcer. Granulation tissue and wound base is pink.  There is no exposed bone muscle-tendon ligament or joint. There is no malodor. Periwound integrity is intact. Skin is warm, dry and supple bilateral lower extremities.  Vascular: Palpable pedal pulses bilaterally. Mild edema noted. Capillary refill within normal limits. Varicosities noted bilateral lower extremities.   Neurological: Epicritic and protective threshold absent bilaterally.   Musculoskeletal Exam: Range of motion within normal limits to all pedal and ankle joints bilateral. Muscle strength 5/5 in all groups bilateral.   AssessLeft foot and ankle ulcerations  secondary to venous insufficiency #2 varicosities bilateral lower extremities  Plan of Care:  #1 Patient was evaluated. #2 medically necessary excisional debridement including subcutaneous tissue was performed using a tissue nipper and a chisel blade. Excisional debridement of all the necrotic nonviable tissue down to healthy  bleeding viable tissue was performed with post-debridement measurements same as pre-. #3 the wound was cleansed with normal saline. #4 today cultures were taken of the left lateral ankle ulcer and sent to pathology for culture and sensitivity   #5 prescription for gentamicin cream and Augmentin #6 callus pads were dispensed to alleviate pressure from the ankle ulceration site #7 recommend daily application of antibiotic ointment and a Band-Aid #8 continue wearing postoperative shoe. Although the patient is not diabetic to do recommend some good supportive diabetic shoes. #9 return to clinic in 2 weeks  [name], DPM [facility]  Dr. [name], [address] [place]                                        [place], [place] [address]                Office [phone]  Fax [phone]

## 2017-06-21 LAB — WOUND CULTURE
Gram Stain: NONE SEEN
Gram Stain: NONE SEEN

## 2017-06-23 ENCOUNTER — Other Ambulatory Visit: Payer: Self-pay | Admitting: Podiatry

## 2017-06-23 ENCOUNTER — Telehealth: Payer: Self-pay | Admitting: Podiatry

## 2017-06-23 ENCOUNTER — Telehealth: Payer: Self-pay | Admitting: *Deleted

## 2017-06-23 DIAGNOSIS — M1990 Unspecified osteoarthritis, unspecified site: Secondary | ICD-10-CM | POA: Diagnosis not present

## 2017-06-23 DIAGNOSIS — I739 Peripheral vascular disease, unspecified: Secondary | ICD-10-CM | POA: Diagnosis not present

## 2017-06-23 DIAGNOSIS — Z4781 Encounter for orthopedic aftercare following surgical amputation: Secondary | ICD-10-CM | POA: Diagnosis not present

## 2017-06-23 DIAGNOSIS — I10 Essential (primary) hypertension: Secondary | ICD-10-CM | POA: Diagnosis not present

## 2017-06-23 DIAGNOSIS — I4891 Unspecified atrial fibrillation: Secondary | ICD-10-CM | POA: Diagnosis not present

## 2017-06-23 DIAGNOSIS — D649 Anemia, unspecified: Secondary | ICD-10-CM | POA: Diagnosis not present

## 2017-06-23 MED ORDER — CLINDAMYCIN HCL 300 MG PO CAPS: 300.0000 mg | ORAL_CAPSULE | Freq: Three times a day (TID) | ORAL | 0 refills | Status: DC

## 2017-06-23 NOTE — Telephone Encounter (Signed)
Yes, [name] just prescribed clindamycin for my mother in law to take. She is already on amoxicillin which she runs out of that on Thursday. Does she need to keep taking that and take the clindamycin at the same time. You can reach me at [phone]. Thank you.

## 2017-06-23 NOTE — Telephone Encounter (Addendum)
-----   Message from [name], DPM sent at 06/23/2017  8:49 AM EDT ----- Regarding: MRSA Please contact patient and let them know culture results grew MRSA please prescribe clindamycin 300mg  #30 Q8H no refills.  Thanks, [name]. I informed pt's dtr, Horris Latino of [name] review of cultures and orders.

## 2017-06-23 NOTE — Telephone Encounter (Signed)
I told [name] to have pt just take the Clindamycin.

## 2017-06-26 DIAGNOSIS — I4891 Unspecified atrial fibrillation: Secondary | ICD-10-CM | POA: Diagnosis not present

## 2017-06-26 DIAGNOSIS — M1990 Unspecified osteoarthritis, unspecified site: Secondary | ICD-10-CM | POA: Diagnosis not present

## 2017-06-26 DIAGNOSIS — I10 Essential (primary) hypertension: Secondary | ICD-10-CM | POA: Diagnosis not present

## 2017-06-26 DIAGNOSIS — I739 Peripheral vascular disease, unspecified: Secondary | ICD-10-CM | POA: Diagnosis not present

## 2017-06-26 DIAGNOSIS — Z4781 Encounter for orthopedic aftercare following surgical amputation: Secondary | ICD-10-CM | POA: Diagnosis not present

## 2017-06-26 DIAGNOSIS — D649 Anemia, unspecified: Secondary | ICD-10-CM | POA: Diagnosis not present

## 2017-06-30 DIAGNOSIS — Z4781 Encounter for orthopedic aftercare following surgical amputation: Secondary | ICD-10-CM | POA: Diagnosis not present

## 2017-06-30 DIAGNOSIS — I739 Peripheral vascular disease, unspecified: Secondary | ICD-10-CM | POA: Diagnosis not present

## 2017-06-30 DIAGNOSIS — I4891 Unspecified atrial fibrillation: Secondary | ICD-10-CM | POA: Diagnosis not present

## 2017-06-30 DIAGNOSIS — D649 Anemia, unspecified: Secondary | ICD-10-CM | POA: Diagnosis not present

## 2017-06-30 DIAGNOSIS — I10 Essential (primary) hypertension: Secondary | ICD-10-CM | POA: Diagnosis not present

## 2017-06-30 DIAGNOSIS — M1990 Unspecified osteoarthritis, unspecified site: Secondary | ICD-10-CM | POA: Diagnosis not present

## 2017-07-02 ENCOUNTER — Ambulatory Visit: Payer: Medicare Other | Admitting: Podiatry

## 2017-07-07 DIAGNOSIS — I10 Essential (primary) hypertension: Secondary | ICD-10-CM | POA: Diagnosis not present

## 2017-07-07 DIAGNOSIS — D649 Anemia, unspecified: Secondary | ICD-10-CM | POA: Diagnosis not present

## 2017-07-07 DIAGNOSIS — Z4781 Encounter for orthopedic aftercare following surgical amputation: Secondary | ICD-10-CM | POA: Diagnosis not present

## 2017-07-07 DIAGNOSIS — I4891 Unspecified atrial fibrillation: Secondary | ICD-10-CM | POA: Diagnosis not present

## 2017-07-07 DIAGNOSIS — I739 Peripheral vascular disease, unspecified: Secondary | ICD-10-CM | POA: Diagnosis not present

## 2017-07-07 DIAGNOSIS — M1990 Unspecified osteoarthritis, unspecified site: Secondary | ICD-10-CM | POA: Diagnosis not present

## 2017-07-09 ENCOUNTER — Ambulatory Visit (INDEPENDENT_AMBULATORY_CARE_PROVIDER_SITE_OTHER): Payer: Medicare Other | Admitting: Podiatry

## 2017-07-09 DIAGNOSIS — I83025 Varicose veins of left lower extremity with ulcer other part of foot: Secondary | ICD-10-CM

## 2017-07-09 DIAGNOSIS — L97522 Non-pressure chronic ulcer of other part of left foot with fat layer exposed: Secondary | ICD-10-CM | POA: Diagnosis not present

## 2017-07-09 DIAGNOSIS — L97529 Non-pressure chronic ulcer of other part of left foot with unspecified severity: Secondary | ICD-10-CM

## 2017-07-09 MED ORDER — CLINDAMYCIN HCL 300 MG PO CAPS: 300.0000 mg | ORAL_CAPSULE | Freq: Three times a day (TID) | ORAL | 0 refills | Status: DC

## 2017-07-14 DIAGNOSIS — Z4781 Encounter for orthopedic aftercare following surgical amputation: Secondary | ICD-10-CM | POA: Diagnosis not present

## 2017-07-14 DIAGNOSIS — I739 Peripheral vascular disease, unspecified: Secondary | ICD-10-CM | POA: Diagnosis not present

## 2017-07-14 DIAGNOSIS — I4891 Unspecified atrial fibrillation: Secondary | ICD-10-CM | POA: Diagnosis not present

## 2017-07-14 DIAGNOSIS — D649 Anemia, unspecified: Secondary | ICD-10-CM | POA: Diagnosis not present

## 2017-07-14 DIAGNOSIS — M1990 Unspecified osteoarthritis, unspecified site: Secondary | ICD-10-CM | POA: Diagnosis not present

## 2017-07-14 DIAGNOSIS — I10 Essential (primary) hypertension: Secondary | ICD-10-CM | POA: Diagnosis not present

## 2017-07-17 DIAGNOSIS — I480 Paroxysmal atrial fibrillation: Secondary | ICD-10-CM | POA: Diagnosis not present

## 2017-07-17 DIAGNOSIS — Z7901 Long term (current) use of anticoagulants: Secondary | ICD-10-CM | POA: Diagnosis not present

## 2017-07-19 NOTE — Progress Notes (Signed)
   Subjective:  83 year old female presents to the office today for follow-up evaluation regarding venous ulcerations to the left lower extremity. Patient states that she does have some pain which comes and goes the left ankle ulceration. Otherwise has no new complaints today.   Objective/Physical Exam General: The patient is alert and oriented x3 in no acute distress.  Dermatology:  Wound #1 noted to the second digit amputation stump measuring [phone] cm secondary to venous insufficiency (LxWxD).   Wound #2 noted to the left lateral ankle measuring [phone] cm secondary to venous insufficiency.  To the noted ulceration(s), there is no eschar. There is a moderate amount of slough, fibrin, and necrotic tissue noted. Moderate amount of purulent drainage also noted to the lateral ankle ulcer. Granulation tissue and wound base is pink.  There is no exposed bone muscle-tendon ligament or joint. There is no malodor. Periwound integrity is intact. Skin is warm, dry and supple bilateral lower extremities.  Vascular: Palpable pedal pulses bilaterally. Mild edema noted. Capillary refill within normal limits. Varicosities noted bilateral lower extremities.   Neurological: Epicritic and protective threshold absent bilaterally.   Musculoskeletal Exam: Range of motion within normal limits to all pedal and ankle joints bilateral. Muscle strength 5/5 in all groups bilateral.   AssessLeft foot and ankle ulcerations  secondary to venous insufficiency #2 varicosities bilateral lower extremities  Plan of Care:  #1 Patient was evaluated. #2 medically necessary excisional debridement including subcutaneous tissue was performed using a tissue nipper and a chisel blade. Excisional debridement of all the necrotic nonviable tissue down to healthy bleeding viable tissue was performed with post-debridement measurements same as pre-. #3 the wound was cleansed with normal saline and dry sterile dressings  applied. #4 today were going to refill the patient's ascription for clindamycin. Culture results taken last visit were positive for MRSA. #5 offloading donut pads were dispensed to offload the ankle ulceration #6 recommend that the patient get some symptomatic good supportive shoes at the shoe market on [address]. #7 patient may require custom molded diabetic insoles although she is not diabetic. She suffers from recurrent ulcerations with history of amputations. #8 return to clinic in 3 weeks  [name], DPM [facility]  Dr. [name], [address] [place]                                        [place], [place] [address]                Office [phone]  Fax [phone]

## 2017-07-25 DIAGNOSIS — Z6821 Body mass index (BMI) 21.0-21.9, adult: Secondary | ICD-10-CM | POA: Diagnosis not present

## 2017-07-25 DIAGNOSIS — E11621 Type 2 diabetes mellitus with foot ulcer: Secondary | ICD-10-CM | POA: Diagnosis not present

## 2017-07-25 DIAGNOSIS — I480 Paroxysmal atrial fibrillation: Secondary | ICD-10-CM | POA: Diagnosis not present

## 2017-07-25 DIAGNOSIS — Z23 Encounter for immunization: Secondary | ICD-10-CM | POA: Diagnosis not present

## 2017-07-25 DIAGNOSIS — R2689 Other abnormalities of gait and mobility: Secondary | ICD-10-CM | POA: Diagnosis not present

## 2017-07-25 DIAGNOSIS — Z7901 Long term (current) use of anticoagulants: Secondary | ICD-10-CM | POA: Diagnosis not present

## 2017-07-25 DIAGNOSIS — E784 Other hyperlipidemia: Secondary | ICD-10-CM | POA: Diagnosis not present

## 2017-07-25 DIAGNOSIS — I7389 Other specified peripheral vascular diseases: Secondary | ICD-10-CM | POA: Diagnosis not present

## 2017-07-25 DIAGNOSIS — E1151 Type 2 diabetes mellitus with diabetic peripheral angiopathy without gangrene: Secondary | ICD-10-CM | POA: Diagnosis not present

## 2017-07-25 DIAGNOSIS — I10 Essential (primary) hypertension: Secondary | ICD-10-CM | POA: Diagnosis not present

## 2017-07-30 ENCOUNTER — Ambulatory Visit (INDEPENDENT_AMBULATORY_CARE_PROVIDER_SITE_OTHER): Payer: Self-pay | Admitting: Podiatry

## 2017-07-30 DIAGNOSIS — I83025 Varicose veins of left lower extremity with ulcer other part of foot: Secondary | ICD-10-CM

## 2017-07-30 DIAGNOSIS — L84 Corns and callosities: Secondary | ICD-10-CM

## 2017-07-30 DIAGNOSIS — L97529 Non-pressure chronic ulcer of other part of left foot with unspecified severity: Secondary | ICD-10-CM

## 2017-08-01 NOTE — Progress Notes (Signed)
   Subjective: Patient with diabetes mellitus presents to the office today for follow up evaluation of painful callus lesions of the left foot. She states the lateral left ankle lesion is looking better and states the pain has resolved significantly. She reports she has completed the course of antibiotics. Patient presents today for further treatment and evaluation.  Past Medical History:  Diagnosis Date  . Anemia   . Arthritis   . Dysrhythmia    hx brief AF post op hip surg  . GERD (gastroesophageal reflux disease)   . Hypertension     Objective:  Physical Exam General: Alert and oriented x3 in no acute distress  Dermatology: Hyperkeratotic lesion present on the left foot x 2. Pain on palpation with a central nucleated core noted.  Skin is warm, dry and supple bilateral lower extremities. Negative for open lesions or macerations.  Vascular: Palpable pedal pulses bilaterally. No edema or erythema noted. Capillary refill within normal limits.  Neurological: Epicritic and protective threshold diminished bilaterally.   Musculoskeletal Exam: Pain on palpation at the keratotic lesion noted. Range of motion within normal limits bilateral. Muscle strength 5/5 in all groups bilateral.  Assessment: #1 Diabetes mellitus w/ peripheral neuropathy #2 Pre-ulcerative callus lesions left foot x 2   Plan of Care:  #1 Patient evaluated. #2 Excisional debridement of keratotic lesion using a chisel blade was performed without incident.  #3 Dressed with light dressing. #4 Recommended good shoe gear. #5 Patient is to return to the clinic PRN.    [name], DPM [facility]  Dr. [name], [address]                                        [place], [place] [address]                Office [phone]  Fax [phone]

## 2017-08-04 ENCOUNTER — Other Ambulatory Visit: Payer: Self-pay | Admitting: Interventional Radiology

## 2017-08-04 DIAGNOSIS — I739 Peripheral vascular disease, unspecified: Secondary | ICD-10-CM

## 2017-08-14 DIAGNOSIS — I480 Paroxysmal atrial fibrillation: Secondary | ICD-10-CM | POA: Diagnosis not present

## 2017-08-14 DIAGNOSIS — Z7901 Long term (current) use of anticoagulants: Secondary | ICD-10-CM | POA: Diagnosis not present

## 2017-08-20 ENCOUNTER — Ambulatory Visit
Admission: RE | Admit: 2017-08-20 | Discharge: 2017-08-20 | Disposition: A | Discharge status: Home / Self Care | Payer: Medicare Other | Source: Ambulatory Visit | Attending: Interventional Radiology | Admitting: Interventional Radiology

## 2017-08-20 DIAGNOSIS — I739 Peripheral vascular disease, unspecified: Secondary | ICD-10-CM | POA: Diagnosis not present

## 2017-08-20 HISTORY — PX: IR RADIOLOGIST EVAL & MGMT: IMG5224

## 2017-08-20 NOTE — Progress Notes (Signed)
Chief Complaint: I have toe amputation.   Referring Physician(s): Dr Daylene Katayama, DPM  PCP: Dr. Leanna Battles, [facility]  History of Present Illness: [name] is a 83 y.o. female presenting as a scheduled follow up to [place] clinic, SP revascularization of CTO of the left popliteal artery.  Her disease was contributing to Rutherford 5 class symptoms of CLI, with a left 3rd toe amputation performed 02/24/2017.  Her original revascularization was performed 03/25/2017, and she unfortunately had thrombotic event that required overnight lysis on 5/22 - 5/23.  We were able to restore complete flow through the segment.    She then had a spontaneous hemorrhage at the access site on the right hip, presumably from her puncture site, just 2 days later.  She required an emergent surgical repair and hematoma evacuation, performed by Dr. Curt Jews of Vascular Surgery.  She was discharged on 5/31.    She has since had follow up with Vascular Surgery as late as 05/13/2017, and ongoing with Podiatry as late as 08/08/2017.  She tells me both services have discharged her and will see her as-needed.    Today she is here with her daughter in-law.  They tell me that she is doing very well.  She is very happy to report that she has healed her left foot amputation site, and she is back to doing all of her desired activities.  She reports that she has no resting pain, and has no new wound at this time.  Her right hip has healed from surgery.    She has recently been working more in the yard with yard work, which is something that she enjoys.  She feels this in contributing to bruising and ecchymosis of her upper extremities.  She also has some ecchymosis at her right cheek under her eye, and she denies any trauma or fall.  Presumably this is secondary to her dual antiplatelet and coumadin therapy.    She reports now using diabetic foot shoes for comfort and protection.  She continues to see her  PCP, Dr. Philip Aspen.    Past Medical History:  Diagnosis Date  . Anemia   . Arthritis   . Dysrhythmia    hx brief AF post op hip surg  . GERD (gastroesophageal reflux disease)   . Hypertension     Past Surgical History:  Procedure Laterality Date  . AMPUTATION Right 02/04/2013   Procedure: [name] ;  Surgeon: Wylene Simmer, MD;  Location: [facility];  Service: Orthopedics;  Laterality: Right;  . APPENDECTOMY    . COLONOSCOPY    . EYE SURGERY     both cataracts  . FEMORAL ARTERY EXPLORATION Right 04/12/2017   Procedure: EVACUATION HEMATOMA RIGHT GROIN WITH FEMORAL ARTERY REPAIR;  Surgeon: Rosetta Posner, MD;  Location: [facility];  Service: Vascular;  Laterality: Right;  . INCISION AND DRAINAGE  2011   left hip inf-hemovac  . IR ANGIOGRAM EXTREMITY LEFT  03/25/2017  . IR ANGIOGRAM EXTREMITY LEFT  04/08/2017  . IR ANGIOGRAM SELECTIVE EACH ADDITIONAL VESSEL  03/25/2017  . IR FEM POP ART STENT INC PTA MOD SED  03/25/2017  . IR INFUSION THROMBOL ARTERIAL INITIAL (MS)  04/08/2017  . IR RADIOLOGIST EVAL & MGMT  02/26/2017  . IR RADIOLOGIST EVAL & MGMT  04/03/2017  . IR THROMB F/U EVAL ART/VEN FINAL DAY (MS)  04/09/2017  . IR TIB-PERO ART PTA MOD SED  03/25/2017  . IR US GUIDE  VASC ACCESS RIGHT  03/25/2017  . IR US GUIDE VASC ACCESS RIGHT  04/08/2017  . KNEE ARTHROSCOPY  1995   left  . NECK MASS EXCISION    . TONSILLECTOMY    . TOTAL HIP ARTHROPLASTY  2006   left-fx  . TOTAL KNEE ARTHROPLASTY  [id number]   left  . TOTAL KNEE ARTHROPLASTY  2009   right    Allergies: Amprenavir; Bactrim [sulfamethoxazole-trimethoprim]; Ceftriaxone sodium; Phenergan [promethazine hcl]; Sulfonamide derivatives; and Vancomycin hcl  Medications: Prior to Admission medications   Medication Sig Start Date End Date Taking? Authorizing Provider  acetaminophen (TYLENOL) 325 MG tablet Take 650 mg by mouth daily.    Yes [provider]  aspirin EC 325 MG tablet Take 325 mg by mouth at  bedtime.   Yes [provider]  calcium carbonate (OSCAL) 1500 (600 Ca) MG TABS tablet Take 1 tablet by mouth daily with breakfast.    Yes [provider]  clopidogrel (PLAVIX) 75 MG tablet Take 1 tablet (75 mg total) by mouth daily. 04/11/17  Yes Saverio Danker, PA-C  ferrous sulfate 325 (65 FE) MG tablet Take 325 mg by mouth daily with breakfast.   Yes [provider]  metoprolol succinate (TOPROL-XL) 25 MG 24 hr tablet Take 25 mg by mouth at bedtime.    Yes [provider]  pantoprazole (PROTONIX) 40 MG tablet Take 1 tablet (40 mg total) by mouth daily. 04/17/17  Yes Reyne Dumas, MD  Vitamin D, Ergocalciferol, (DRISDOL) 50000 units CAPS capsule Take 50,000 Units by mouth every 7 (seven) days. Sundays   Yes [provider]  warfarin (COUMADIN) 3 MG tablet Take 1 tablet (3 mg total) by mouth daily at 6 PM. FOR A-FIB 04/18/17  Yes Reyne Dumas, MD  amoxicillin-clavulanate (AUGMENTIN) 500-125 MG tablet Take 1 tablet (500 mg total) by mouth 2 (two) times daily. Patient not taking: Reported on 08/20/2017 06/18/17   [name], DPM  clindamycin (CLEOCIN) 300 MG capsule Take 1 capsule (300 mg total) by mouth 3 (three) times daily. Patient not taking: Reported on 08/20/2017 07/09/17   [name], DPM  docusate sodium (COLACE) 100 MG capsule Take 1 capsule (100 mg total) by mouth daily. Patient not taking: Reported on 08/20/2017 04/17/17   Reyne Dumas, MD  gentamicin cream (GARAMYCIN) 0.1 % Apply 1 application topically 3 (three) times daily. Patient not taking: Reported on 08/20/2017 06/18/17   [name], DPM  guaiFENesin-dextromethorphan (ROBITUSSIN DM) 100-10 MG/5ML syrup Take 15 mLs by mouth every 4 (four) hours as needed for cough. Patient not taking: Reported on 05/01/2017 04/17/17   Reyne Dumas, MD  magnesium hydroxide (MILK OF MAGNESIA) 400 MG/5ML suspension Take 45 mLs by mouth See admin instructions. AS NEEDED FOR 2 DAYS TO STOP ON 04/13/17     [provider]  Multiple Vitamin (MULTIVITAMIN) tablet Take 1 tablet by mouth daily.    [provider]  oxyCODONE-acetaminophen (PERCOCET/ROXICET) 5-325 MG tablet Take 1 tablet by mouth every 8 (eight) hours as needed for moderate pain. Patient not taking: Reported on 05/01/2017 04/17/17   Reyne Dumas, MD  traMADol (ULTRAM) 50 MG tablet Take 1 mg by mouth every 6 (six) hours as needed. 02/24/17   [name], DPM  vitamin B-12 (CYANOCOBALAMIN) 1000 MCG tablet Take 1,000 mcg by mouth daily.    [provider]     Family History  Problem Relation Age of Onset  . Cancer Mother     Social History  Social History  . Marital status: Married    Spouse name: N/A  . Number of children: N/A  . Years of education: N/A   Social History Main Topics  . Smoking status: Never Smoker  . Smokeless tobacco: Never Used  . Alcohol use No  . Drug use: No  . Sexual activity: Not on file   Other Topics Concern  . Not on file   Social History Narrative  . No narrative on file     Review of Systems: A 12 point ROS discussed and pertinent positives are indicated in the HPI above.  All other systems are negative.  Review of Systems  Vital Signs: BP (!) 161/95   Pulse 84   Temp 97.6 F (36.4 C) (Oral)   Resp 14   Ht 5\' 3"  (1.6 m)   Wt 115 lb (52.2 kg)   SpO2 98%   BMI 20.37 kg/m   Physical Exam  Bilateral upper extremities with ecchymosis along the dorsal aspect of the forearm.  She has ecchymosis under her right eye.  No scleral injection.    Targeted exam of the foot shows healed amputation site at the left third toe.  No new wounds.  She has a callous on the tip of the left second toe which was recently debrided.  No sign of infection or bleeding.   Warm foot, left and right.  No intertriginous lesions on the left or the right.  She has no ongoing swelling of the left or the right LE.  Motor and sensory intact bilateral.  Palpable left DP.  Doppler + DP  and PT on the left.  Palpable right PT.  Doppler + DP and PT on the right.   Imaging: No results found.  Labs:  CBC:  Recent Labs  04/12/17 1452 04/12/17 1628 04/13/17 0022 04/14/17 0946 04/15/17 1410  WBC 14.1*  --  20.1* 15.5* 11.7*  HGB 10.6* 10.5* 11.7* 9.8* 10.0*  HCT 33.1* 31.0* 35.2* 30.1* 31.2*  PLT 239  --  183 180 207    COAGS:  Recent Labs  03/25/17 0645  04/14/17 0244 04/15/17 0157 04/16/17 0213 04/17/17 0207  INR 1.16  < > 1.97 1.44 1.20 1.25  APTT 32  --   --   --   --   --   < > = values in this interval not displayed.  BMP:  Recent Labs  04/07/17 0850 04/12/17 1452 04/12/17 1628 04/13/17 0022 04/15/17 0157  NA 138 140 142 135 137  K 3.6 3.7 4.8 4.5 3.8  CL 101 106  --  105 107  CO2 25 22  --  22 24  GLUCOSE 314* 309* 342* 354* 153*  BUN 16 16  --  24* 20  CALCIUM 9.0 9.1  --  8.5* 8.2*  CREATININE 0.83 1.05*  --  1.25* 0.83  GFRNONAA >60 47*  --  38* >60  GFRAA >60 54*  --  44* >60    LIVER FUNCTION TESTS:  Recent Labs  03/18/17 1138 04/15/17 0157  BILITOT 1.5* 1.1  AST 23 23  ALT 24 22  ALKPHOS 77 46  PROT 6.8 4.4*  ALBUMIN 3.5 2.4*    TUMOR MARKERS: No results for input(s): AFPTM, CEA, CA199, CHROMGRNA in the last 8760 hours.  Assessment and Plan:  Ms Flamenco is 83 year old female with history of revascularization of CTO of left fem-pop lesion, contributing to left foot CLI, Rutherford 5 class, with prior amputation of third toe.  Her initial recovery from revascularization was complicated by thrombosis, treated successfully with lysis, and then a spontaneous hemorrhage of the right hip access, requiring surgery.   She now has had a good recovery, with healing of her left foot wound.  She has warm left foot with palpable DP pulse.    At this point, we will watch her for any new wounds, and I have encouraged her to continue excellent foot care.    Plan: - Discontinue plavix, with need only to continue single  anti-platelet (daily ASA), and her coumadin therapy as prescribed. Continue maximal medical therapy for PAD history.  - We will see her in 3-4 months for wound check and repeat non-invasive ABI. - I have encouraged her to continue excellent routine foot care with [name] - I have encouraged her to observe her future appointments with her doctors.   Thank you for this interesting consult.  I greatly enjoyed meeting QUINTESSA SIMMERMAN and look forward to participating in their care.  A copy of this report was sent to the requesting provider on this date.  Electronically Signed: Corrie Mckusick 08/20/2017, 10:21 AM `  I spent a total of    25 Minutes in face to face in clinical consultation, greater than 50% of which was counseling/coordinating care for PAD/CLI, SP revascularization of CTO of left fem-pop with stenting.

## 2017-08-25 ENCOUNTER — Ambulatory Visit (INDEPENDENT_AMBULATORY_CARE_PROVIDER_SITE_OTHER): Payer: Medicare Other

## 2017-08-25 ENCOUNTER — Ambulatory Visit (INDEPENDENT_AMBULATORY_CARE_PROVIDER_SITE_OTHER): Payer: Medicare Other | Admitting: Podiatry

## 2017-08-25 DIAGNOSIS — I83025 Varicose veins of left lower extremity with ulcer other part of foot: Secondary | ICD-10-CM | POA: Diagnosis not present

## 2017-08-25 DIAGNOSIS — L97522 Non-pressure chronic ulcer of other part of left foot with fat layer exposed: Secondary | ICD-10-CM | POA: Diagnosis not present

## 2017-08-25 DIAGNOSIS — L97529 Non-pressure chronic ulcer of other part of left foot with unspecified severity: Secondary | ICD-10-CM

## 2017-08-25 DIAGNOSIS — M79675 Pain in left toe(s): Secondary | ICD-10-CM

## 2017-08-25 MED ORDER — DELAFLOXACIN MEGLUMINE 450 MG PO TABS: 1.0000 | ORAL_TABLET | Freq: Two times a day (BID) | ORAL | 0 refills | Status: DC

## 2017-08-26 NOTE — Progress Notes (Signed)
   Subjective:  Patient presents today for follow up evaluation of an ulceration to the left second toe. She reports mild pain to the ulceration site she describes as soreness. She has been wearing better shoes which helps alleviate the pain.  She also complains of thickened, discolored nails of the right foot stating they began becoming detached. Patient presents today for further treatment and evaluation.    Past Medical History:  Diagnosis Date  . Anemia   . Arthritis   . Dysrhythmia    hx brief AF post op hip surg  . GERD (gastroesophageal reflux disease)   . Hypertension      Objective/Physical Exam General: The patient is alert and oriented x3 in no acute distress.  Dermatology:  Wound #1 noted to the left second toe measuring approximately 1.0 x 1.0 x 0.2 cm (LxWxD).   To the noted ulceration(s), there is no eschar. There is a moderate amount of slough, fibrin, and necrotic tissue noted. Granulation tissue and wound base is red. There is a minimal amount of serosanguineous drainage noted. There is no exposed bone muscle-tendon ligament or joint. There is no malodor. Periwound integrity is intact. Skin is warm, dry and supple bilateral lower extremities.  Vascular: Palpable pedal pulses bilaterally. Mild edema noted. Capillary refill within normal limits. Varicosities noted bilateral lower extremities.   Neurological: Epicritic and protective threshold absent bilaterally.   Musculoskeletal Exam: Range of motion within normal limits to all pedal and ankle joints bilateral. Muscle strength 5/5 in all groups bilateral.   Radiographic Exam:  Normal osseous mineralization. Joint spaces preserved. No fracture/dislocation/boney destruction.    Assessment: #1 ulcer of the left second toe secondary to venous insufficiency #2 varicosities bilateral lower extremities  Plan of Care:  #1 Patient was evaluated. X-rays reviewed. #2 medically necessary excisional debridement including  muscle and deep fascial tissue was performed using a tissue nipper and a chisel blade. Excisional debridement of all the necrotic nonviable tissue down to healthy bleeding viable tissue was performed with post-debridement measurements same as pre-. #3 The wound was cleansed and dry sterile dressing applied. #4 culture of the ulcer was taken. #5 prescription for Baxdela sent to [facility]. #6 continue applying gentamicin cream with dry sterile dressing daily. #7 weightbearing in postop shoe. #8 return to clinic in 2 weeks.   [name], DPM [facility]  Dr. [name], [address]                                        [place], [place] [address]                Office [phone]  Fax [phone]

## 2017-08-28 ENCOUNTER — Telehealth: Payer: Self-pay | Admitting: Podiatry

## 2017-08-28 LAB — WOUND CULTURE
MICRO NUMBER: 81117558
SPECIMEN QUALITY:: ADEQUATE

## 2017-08-28 MED ORDER — GENTAMICIN SULFATE 0.1 % EX CREA: 1.0000 "application " | TOPICAL_CREAM | Freq: Three times a day (TID) | CUTANEOUS | 1 refills | Status: DC

## 2017-08-28 NOTE — Addendum Note (Signed)
Addended by: Harriett Sine D on: 08/28/2017 12:25 PM   Modules accepted: Orders

## 2017-08-28 NOTE — Telephone Encounter (Signed)
This is [name] calling for [facility]. She is a pt of [name], and she was seen by him on Monday. He prescribed an antibiotic for her that was supposed to be mailed to her. She still has not received anything. Can you please call me back at [phone]. Thank you.

## 2017-08-28 NOTE — Telephone Encounter (Signed)
Left message informing pt's dtr, Horris Latino, the gentamycin ointment had been sent to the pharmacy.

## 2017-09-08 ENCOUNTER — Ambulatory Visit (INDEPENDENT_AMBULATORY_CARE_PROVIDER_SITE_OTHER): Payer: Medicare Other | Admitting: Podiatry

## 2017-09-08 ENCOUNTER — Encounter: Payer: Self-pay | Admitting: Podiatry

## 2017-09-08 DIAGNOSIS — L97522 Non-pressure chronic ulcer of other part of left foot with fat layer exposed: Secondary | ICD-10-CM | POA: Diagnosis not present

## 2017-09-08 DIAGNOSIS — L97529 Non-pressure chronic ulcer of other part of left foot with unspecified severity: Secondary | ICD-10-CM

## 2017-09-08 DIAGNOSIS — I83025 Varicose veins of left lower extremity with ulcer other part of foot: Secondary | ICD-10-CM

## 2017-09-10 NOTE — Progress Notes (Signed)
   Subjective:  Patient presents today for follow up evaluation of an ulceration to the left second toe. She states she finished the antibiotic today but did not get the Baxdela due to cost. Patient presents today for further treatment and evaluation.    Past Medical History:  Diagnosis Date  . Anemia   . Arthritis   . Dysrhythmia    hx brief AF post op hip surg  . GERD (gastroesophageal reflux disease)   . Hypertension      Objective/Physical Exam General: The patient is alert and oriented x3 in no acute distress.  Dermatology:  Wound #1 noted to the left second toe measuring approximately 0.1 x 0.1 x 0.2 cm (LxWxD).   To the noted ulceration(s), there is no eschar. There is a moderate amount of slough, fibrin, and necrotic tissue noted. Granulation tissue and wound base is red. There is a minimal amount of serosanguineous drainage noted. There is no exposed bone muscle-tendon ligament or joint. There is no malodor. Periwound integrity is intact. Skin is warm, dry and supple bilateral lower extremities. Erythema significantly improved.  Vascular: Palpable pedal pulses bilaterally. Mild edema noted. Capillary refill within normal limits. Varicosities noted bilateral lower extremities.   Neurological: Epicritic and protective threshold absent bilaterally.   Musculoskeletal Exam: Range of motion within normal limits to all pedal and ankle joints bilateral. Muscle strength 5/5 in all groups bilateral.   Assessment: #1 ulcer of the left second toe secondary to venous insufficiency #2 varicosities bilateral lower extremities  Plan of Care:  #1 Patient was evaluated.  #2 medically necessary excisional debridement including subcutaneous tissue was performed using a tissue nipper and a chisel blade. Excisional debridement of all the necrotic nonviable tissue down to healthy bleeding viable tissue was performed with post-debridement measurements same as pre-. #3 the wound was cleansed and  dry sterile dressing applied. #4 Continue using Gentamicin cream and Band-Aid daily. #5 Toe crest pads dispensed. #6 Return to clinic in 3 weeks.   [name], DPM [facility]  Dr. [name], [address]                                        [place], [place] [address]                Office [phone]  Fax [phone]

## 2017-09-11 DIAGNOSIS — I480 Paroxysmal atrial fibrillation: Secondary | ICD-10-CM | POA: Diagnosis not present

## 2017-09-11 DIAGNOSIS — Z7901 Long term (current) use of anticoagulants: Secondary | ICD-10-CM | POA: Diagnosis not present

## 2017-09-17 ENCOUNTER — Encounter: Payer: Self-pay | Admitting: Interventional Radiology

## 2017-09-22 ENCOUNTER — Ambulatory Visit (INDEPENDENT_AMBULATORY_CARE_PROVIDER_SITE_OTHER): Payer: Medicare Other | Admitting: Podiatry

## 2017-09-22 DIAGNOSIS — L97522 Non-pressure chronic ulcer of other part of left foot with fat layer exposed: Secondary | ICD-10-CM | POA: Diagnosis not present

## 2017-09-22 DIAGNOSIS — L97529 Non-pressure chronic ulcer of other part of left foot with unspecified severity: Secondary | ICD-10-CM

## 2017-09-22 DIAGNOSIS — I83025 Varicose veins of left lower extremity with ulcer other part of foot: Secondary | ICD-10-CM

## 2017-09-25 NOTE — Progress Notes (Signed)
   Subjective:  Patient presents today for follow up evaluation of a venous ulceration of the left foot. She also has a new complaint of an abrasion to the left ankle that she noticed about one week ago. Patient presents today for further treatment and evaluation.    Past Medical History:  Diagnosis Date  . Anemia   . Arthritis   . Dysrhythmia    hx brief AF post op hip surg  . GERD (gastroesophageal reflux disease)   . Hypertension      Objective/Physical Exam General: The patient is alert and oriented x3 in no acute distress.  Dermatology:  Wound #1 noted to the left fourth toe measuring approximately 0.3 x 0.3 x 0.1 cm (LxWxD).   To the noted ulceration(s), there is no eschar. There is a moderate amount of slough, fibrin, and necrotic tissue noted. Granulation tissue and wound base is red. There is a minimal amount of serosanguineous drainage noted. There is no exposed bone muscle-tendon ligament or joint. There is no malodor. Periwound integrity is intact. Skin is warm, dry and supple bilateral lower extremities. Erythema significantly improved.  Vascular: Palpable pedal pulses bilaterally. Mild edema noted. Capillary refill within normal limits. Varicosities noted bilateral lower extremities.   Neurological: Epicritic and protective threshold absent bilaterally.   Musculoskeletal Exam: Range of motion within normal limits to all pedal and ankle joints bilateral. Muscle strength 5/5 in all groups bilateral.   Assessment: #1 ulcer of the left second toe secondary to venous insufficiency #2 varicosities bilateral lower extremities  Plan of Care:  #1 Patient was evaluated.  #2 medically necessary excisional debridement including subcutaneous tissue was performed using a tissue nipper and a chisel blade. Excisional debridement of all the necrotic nonviable tissue down to healthy bleeding viable tissue was performed with post-debridement measurements same as pre-. #3 the wound was  cleansed and dry sterile dressing applied. #4 Continue using Gentamicin cream and Band-Aid daily with toe crest pad. #5 Discussed possible flexor tenotomy in the future, however the daughter is going out of town for the holidays.  #6 Continue conservative treatment. #7 Return to clinic in 3 weeks.   [name], DPM [facility]  Dr. [name], [address]                                        [place], [place] [address]                Office [phone]  Fax [phone]

## 2017-09-27 IMAGING — XA IR THROMB F/U EVAL ART/VEN FINAL DAY
6 series · 14 of 24 positions shown · IV contrast (IODINE)
Comparison: none

INDICATION: Chronic left limb ischemia, status post revascularization of chronic
total occlusion of popliteal artery through proximal posterior
tibial artery 03/25/2017. Patient presented with distal occlusion,
and is status post overnight catheter directed thrombolytic
infusion.

[Series 1: body 4 care · 2 acquisitions, 2 frames shown (1 of 6)]
[im 1/2]
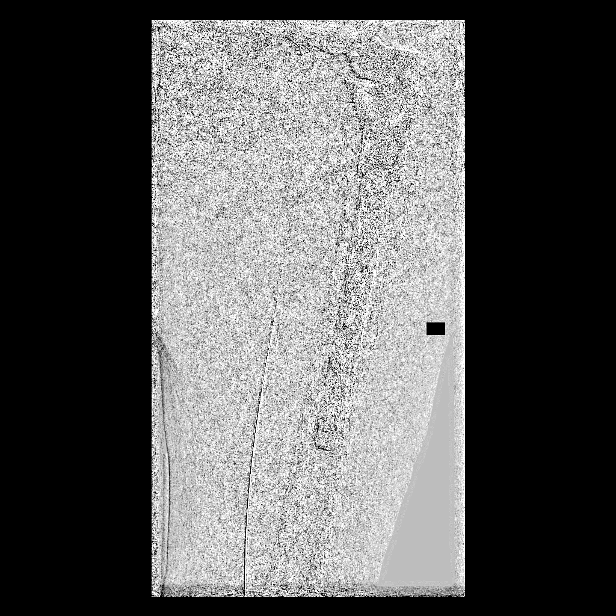
[im 1/2]
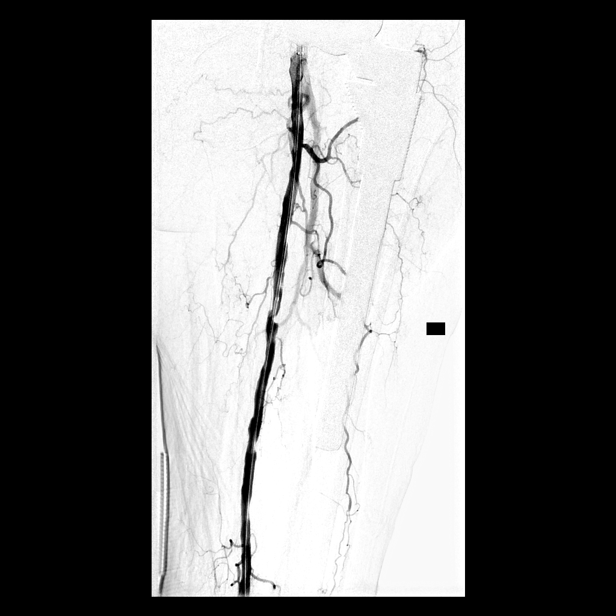

[Series 2: body 4 care · 2 acquisitions, 3 frames shown (2 of 6)]
[im 1/2]
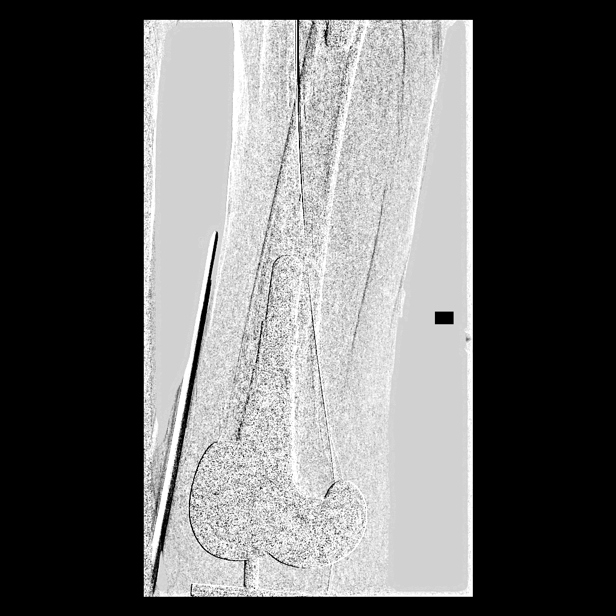
[im 1/2]
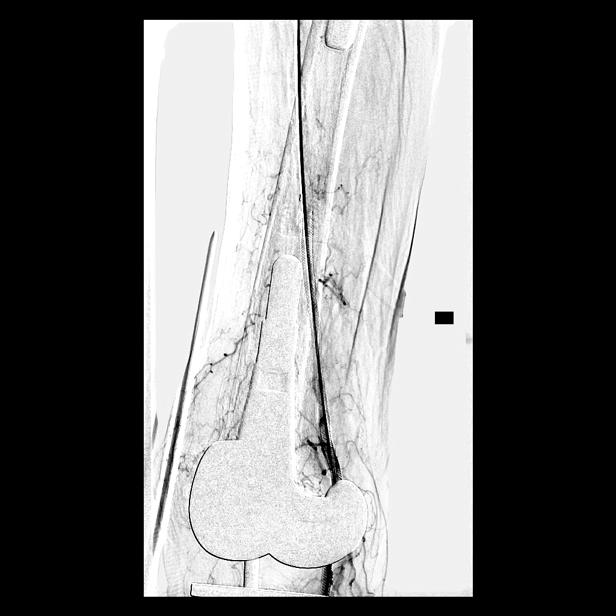
[im 2/2]
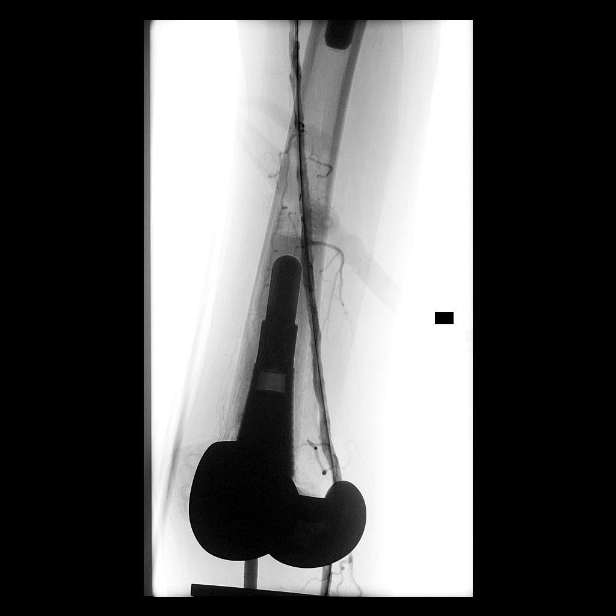

[Series 3: body 4 care · 2 acquisitions, 2 frames shown (3 of 6)]
[im 1/2]
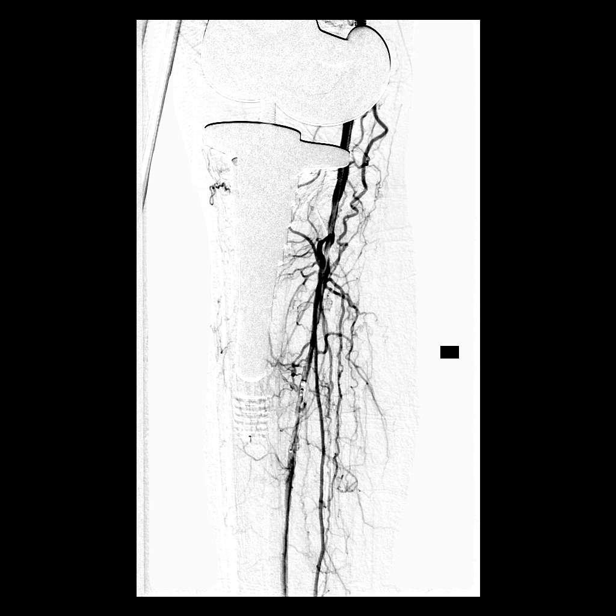
[im 2/2]
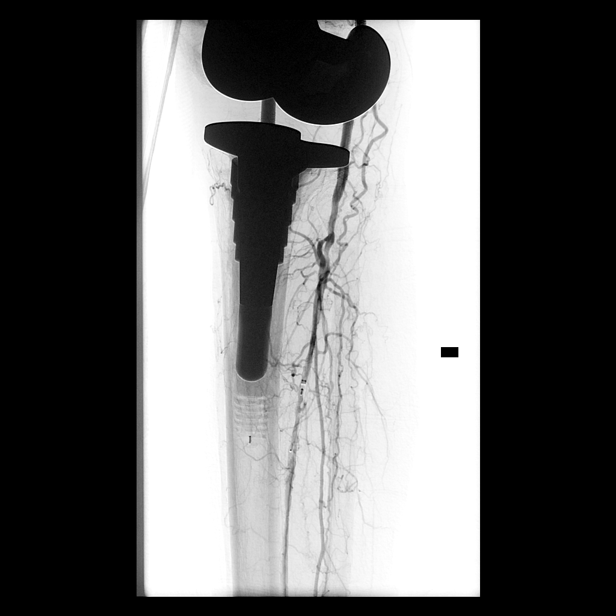

[Series 4: body 4 care · 2 acquisitions, 2 frames shown (4 of 6)]
[im 1/2]
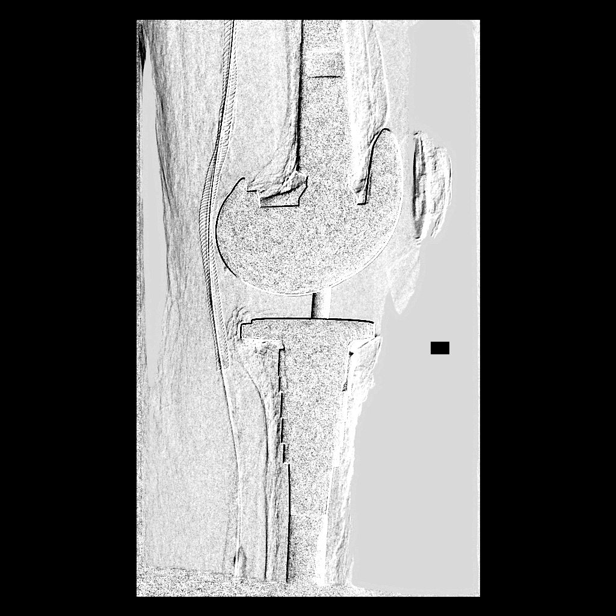
[im 1/2]
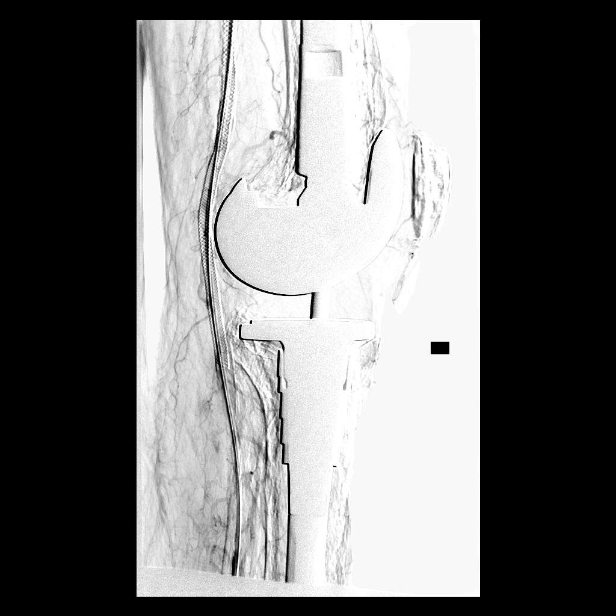

[Series 5: body 4 care · 2 acquisitions, 3 frames shown (5 of 6)]
[im 1/2]
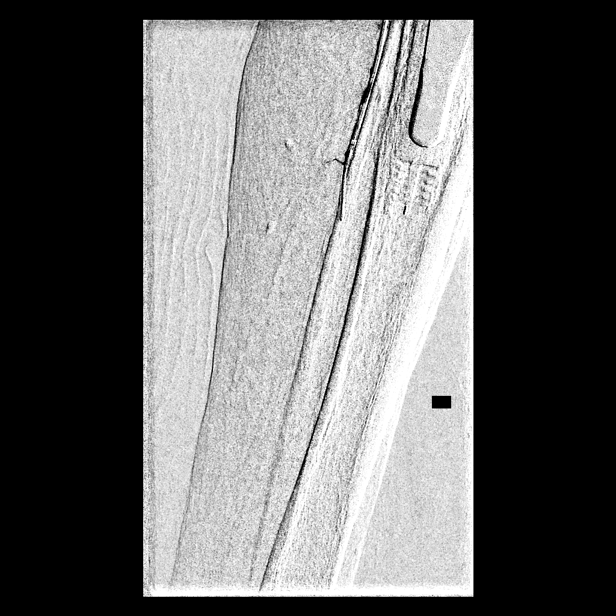
[im 1/2]
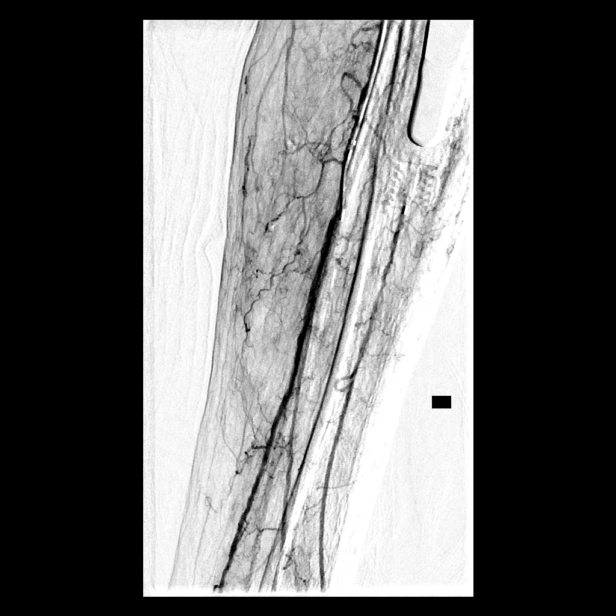
[im 2/2]
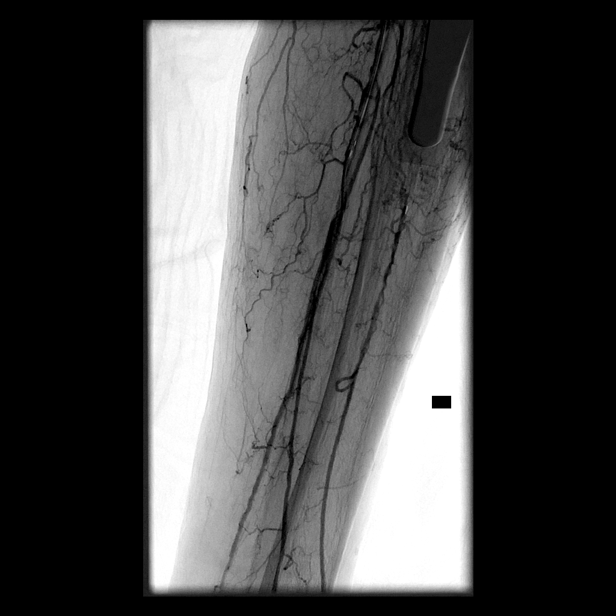

[Series 6: body 4 care · 2 acquisitions, 2 frames shown (6 of 6)]
[im 1/2]
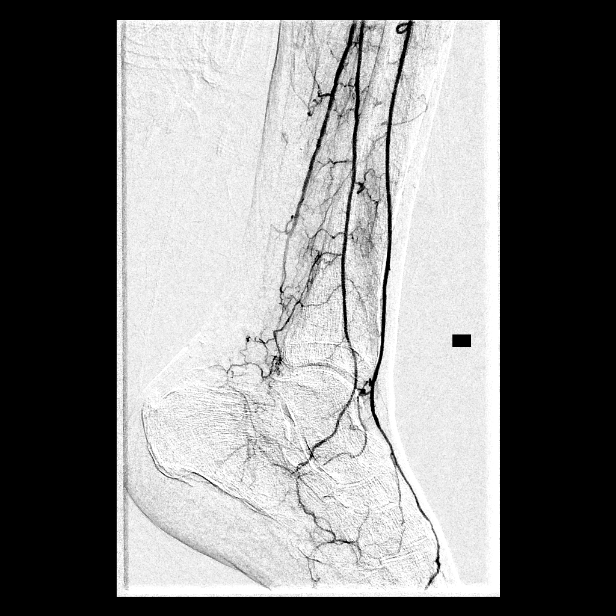
[im 2/2]
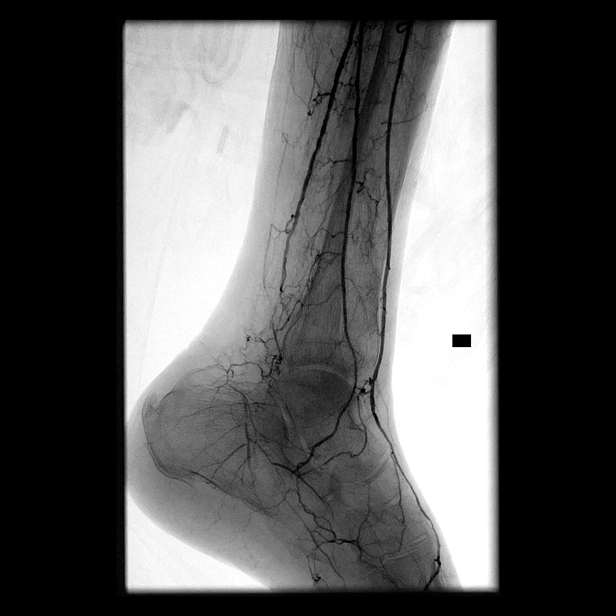

[14 of 24 positions shown; findings below may reference images not displayed]

EXAM:
LEFT LOWER EXTREMITY ARTERIOGRAM THROUGH EXISTING CATHETER

MEDICATIONS:
None required

ANESTHESIA/SEDATION:
None required

CONTRAST:  60 mL [id number]

FLUOROSCOPY TIME:  3 minutes, 57 mGy

COMPLICATIONS:
None immediate.

PROCEDURE:
Informed consent was obtained from the patient following explanation
of the procedure, risks, benefits and alternatives. The patient
understands, agrees and consents for the procedure. All questions
were addressed. A time out was performed prior to the initiation of
the procedure. Contrast was injected through previously placed the
right femoral sheath for left lower extremity arteriography. At the
conclusion of the procedure, the infusion catheter was removed. The
patient tolerated the procedure well.
FINDINGS: Patent left profunda femoris.

Atheromatous SFA with tandem areas of mild to moderate stenosis in
its mid portion.

Clearance of thrombus from the popliteal arterial stent. No residual
thrombus or stenosis.

Origin occlusion of the anterior tibial artery. Short-segment
moderate stenosis just beyond the origin of the tibioperoneal trunk.

Patent posterior tibial artery to just below the distal calf above
the ankle as before ; no direct outflow to the plantar arch.

Short-segment origin stenosis of the peroneal artery of at least
moderate severity, patent distally with a prominent anterior
communicating branch crossing the ankle.

Reconstitution of the distal anterior tibial artery the mid calf
level, patent across the ankle as dorsalis pedis.
IMPRESSION: 1. Interval complete clearance of acute left lower extremity
arterial thrombus after overnight catheter directed thrombolytic
infusion.
2. Restoration of inline flow through the posterior tibial artery to
the ankle.
3. Tandem stenoses in the mid SFA and tibioperoneal trunk.
4. Collateral reconstitution of peroneal and anterior tibial distal
runoff as above.

## 2017-10-03 ENCOUNTER — Telehealth: Payer: Self-pay | Admitting: Podiatry

## 2017-10-03 ENCOUNTER — Ambulatory Visit (INDEPENDENT_AMBULATORY_CARE_PROVIDER_SITE_OTHER): Payer: Medicare Other | Admitting: Podiatry

## 2017-10-03 DIAGNOSIS — L97522 Non-pressure chronic ulcer of other part of left foot with fat layer exposed: Secondary | ICD-10-CM | POA: Diagnosis not present

## 2017-10-03 NOTE — Telephone Encounter (Signed)
I spoke with [name], she states pt's 3rd left toe is worse, but this morning her big toe has a large blister on it. I spoke with Dr. March Rummage, doctor-on-call and he states have pt come in now. I informed [name] and she states she just spoke her sister-in-law and she will bring pt in. I told [name], our hours were to 4:00pm today.

## 2017-10-03 NOTE — Telephone Encounter (Signed)
This is [name] and I'm calling for [employer]. When she last saw [name] he told us to call if her foot looks worse. It is looking worse and we are concerned. Her next appointment is not until after Thanksgiving. Please call either myself back at [phone] or my sister in law Carling Liberman at [phone]. Thank you.

## 2017-10-06 ENCOUNTER — Encounter: Payer: Self-pay | Admitting: Podiatry

## 2017-10-06 ENCOUNTER — Ambulatory Visit (INDEPENDENT_AMBULATORY_CARE_PROVIDER_SITE_OTHER): Payer: Medicare Other | Admitting: Podiatry

## 2017-10-06 ENCOUNTER — Telehealth: Payer: Self-pay | Admitting: Podiatry

## 2017-10-06 DIAGNOSIS — L97529 Non-pressure chronic ulcer of other part of left foot with unspecified severity: Secondary | ICD-10-CM

## 2017-10-06 DIAGNOSIS — L97522 Non-pressure chronic ulcer of other part of left foot with fat layer exposed: Secondary | ICD-10-CM | POA: Diagnosis not present

## 2017-10-06 DIAGNOSIS — I83025 Varicose veins of left lower extremity with ulcer other part of foot: Secondary | ICD-10-CM

## 2017-10-06 MED ORDER — DELAFLOXACIN MEGLUMINE 450 MG PO TABS: 1.0000 | ORAL_TABLET | Freq: Two times a day (BID) | ORAL | 0 refills | Status: DC

## 2017-10-06 NOTE — Telephone Encounter (Signed)
This is [name] calling for my mother in law. We ended up coming in Friday because we had a little emergency situation because of a big blister on her big toe. Now there is a big blister on her 3rd toe. Yesterday it was pretty full and it wasn't dark red but it looked like there was blood in it. She does have an appointment on Wednesday but I'm concerned. [name] told us to call if it got any worse. There seems to be some progression here for whatever reason. Please call me back at [phone]. Thank you.

## 2017-10-06 NOTE — Telephone Encounter (Signed)
I spoke with pt's dtr, Langley Gauss and pt has blister on the 3rd left toe and [name] said to call if problems. I asked Langley Gauss if pt could come in today and she said yes, pt is scheduled for 3:30pm today.

## 2017-10-08 ENCOUNTER — Ambulatory Visit: Payer: Medicare Other | Admitting: Podiatry

## 2017-10-09 LAB — WOUND CULTURE
MICRO NUMBER:: 81302540
SPECIMEN QUALITY: ADEQUATE

## 2017-10-12 NOTE — Progress Notes (Signed)
   Subjective:  Patient presents today for follow up evaluation of a venous ulceration of the left foot. She also has a new ulceration on the left foot. She has been applying gentamicin cream with a Band-Aid daily as directed. There are no modifying factors noted. Patient presents today for further treatment and evaluation.    Past Medical History:  Diagnosis Date  . Anemia   . Arthritis   . Dysrhythmia    hx brief AF post op hip surg  . GERD (gastroesophageal reflux disease)   . Hypertension      Objective/Physical Exam General: The patient is alert and oriented x3 in no acute distress.  Dermatology:  Wound #1 noted to the left fourth toe measuring approximately 1.0 x 1.0 x 0.1 cm (LxWxD).   Wound #2 noted to the left great toe measuring approximately 0.6 x 1.5 x 0.2 cm.  To the noted ulceration(s), there is no eschar. There is a moderate amount of slough, fibrin, and necrotic tissue noted. Granulation tissue and wound base is red. There is a minimal amount of serosanguineous drainage noted. There is no exposed bone muscle-tendon ligament or joint. There is no malodor. Periwound integrity is intact. Skin is warm, dry and supple bilateral lower extremities. Erythema significantly improved.  Vascular: Palpable pedal pulses bilaterally. Mild edema noted. Capillary refill within normal limits. Varicosities noted bilateral lower extremities.   Neurological: Epicritic and protective threshold absent bilaterally.   Musculoskeletal Exam: Range of motion within normal limits to all pedal and ankle joints bilateral. Muscle strength 5/5 in all groups bilateral.   Assessment: #1 ulcer of the left great toe secondary to venous insufficiency #2 ulcer of the left fourth toe secondary to venous insufficiency  #3 varicosities bilateral lower extremities  Plan of Care:  #1 Patient was evaluated.  #2 medically necessary excisional debridement including subcutaneous tissue was performed using a  tissue nipper and a chisel blade. Excisional debridement of all the necrotic nonviable tissue down to healthy bleeding viable tissue was performed with post-debridement measurements same as pre-. #3 the wound was cleansed and dry sterile dressing applied. #4 Continue using Gentamicin cream and Band-Aid daily. #5 Culture taken from wounds. #6 Prescription for Baxdela #20 provided for patient. #7 Return to clinic in 2 weeks.   [name], DPM [facility]  Dr. [name], [place]                                        [place], [place] [address]                Office [phone]  Fax [phone]

## 2017-10-13 ENCOUNTER — Ambulatory Visit: Payer: Medicare Other | Admitting: Podiatry

## 2017-10-13 ENCOUNTER — Other Ambulatory Visit: Payer: Self-pay | Admitting: Podiatry

## 2017-10-13 NOTE — Telephone Encounter (Signed)
This is Chief Technology Officer from [employer]. Pt has already received more refills on the Baxdela. The coupon will only let us dispense this every 3 months for the $50 co pay. So I was wanting to see what else we could dispense in place of that. If you would, please give me a call back at [phone]. Thank you.

## 2017-10-14 NOTE — Telephone Encounter (Signed)
Yes, that is the correct dosing. Thanks, [name]

## 2017-10-14 NOTE — Telephone Encounter (Signed)
[facility] states they do prior authorizations for all insurances except for [employer].

## 2017-10-14 NOTE — Addendum Note (Signed)
Addended by: Harriett Sine D on: 10/14/2017 02:32 PM   Modules accepted: Orders

## 2017-10-14 NOTE — Telephone Encounter (Signed)
Let's see if Sivextro is approved. #20. BID. no refills.

## 2017-10-15 ENCOUNTER — Telehealth: Payer: Self-pay | Admitting: Podiatry

## 2017-10-15 MED ORDER — TEDIZOLID PHOSPHATE 200 MG PO TABS: 1.0000 | ORAL_TABLET | Freq: Every day | ORAL | 0 refills | Status: AC

## 2017-10-15 NOTE — Telephone Encounter (Signed)
This is [name] calling for [employer]. [name] had sent a prescription to [facility] in [place]. They contacted [place] that they cannot refill the prescription because of the coupon being used within three months. They were to contact [name] and we have not heard back from them about his response. She needs an antibiotic for her foot because her toe looks terrible. If someone would please get with [name] and find out what's going on and then contact me. My phone number is [phone]. Thank you very much.

## 2017-10-15 NOTE — Telephone Encounter (Addendum)
I spoke pt's dtr, Horris Latino and explained the antibiotic had to be changed and a new antibiotic was sent to a new pharmacy [facility] and I didn't know to should call her and I apologized. Horris Latino states the toe looks worse even using the gentamicin cream, "looks like an eyeball all swollen and red". I told Horris Latino I would informed [name], to get other instructions and to make sure the creams were not contaminated.

## 2017-10-15 NOTE — Addendum Note (Signed)
Addended by: Harriett Sine D on: 10/15/2017 03:34 PM   Modules accepted: Orders

## 2017-10-15 NOTE — Telephone Encounter (Signed)
I confirmed with [name] his orders for [facility] 200mg  #6 one tablet per day until gone and escribed to [facility],  [address], [place] [address], phone [phone], f [phone].

## 2017-10-16 ENCOUNTER — Telehealth: Payer: Self-pay | Admitting: Podiatry

## 2017-10-16 NOTE — Telephone Encounter (Signed)
I'm calling to let [name] know that the new antibiotic he prescribed with a different pharmacy called [place] last night and told us that the medication is not covered by her insurance. We are wanting to know if he can prescribe with another company or somewhere local like [place]. We are going on two weeks now where she has not had an antibiotic. She is scheduled to come in on Monday. Please call us back and let us know if she will have a new antibiotic or if we will wait until her appointment on Monday. The number you can reach me at is [phone]. Thank you.

## 2017-10-20 ENCOUNTER — Encounter: Payer: Self-pay | Admitting: Podiatry

## 2017-10-20 ENCOUNTER — Ambulatory Visit (INDEPENDENT_AMBULATORY_CARE_PROVIDER_SITE_OTHER): Payer: Medicare Other | Admitting: Podiatry

## 2017-10-20 DIAGNOSIS — I83025 Varicose veins of left lower extremity with ulcer other part of foot: Secondary | ICD-10-CM | POA: Diagnosis not present

## 2017-10-20 DIAGNOSIS — L97522 Non-pressure chronic ulcer of other part of left foot with fat layer exposed: Secondary | ICD-10-CM | POA: Diagnosis not present

## 2017-10-20 DIAGNOSIS — M2042 Other hammer toe(s) (acquired), left foot: Secondary | ICD-10-CM

## 2017-10-20 DIAGNOSIS — L97529 Non-pressure chronic ulcer of other part of left foot with unspecified severity: Secondary | ICD-10-CM | POA: Diagnosis not present

## 2017-10-20 NOTE — Progress Notes (Signed)
Subjective:  Patient presents today for follow up evaluation of a venous ulceration of the left foot.  The patient has been dealing with a venous ulcer to the distal tuft of the fourth digit left foot.  Wound appears stable however there is no significant improvement.  She has been applying gentamicin cream daily with a Band-Aid.  The patient was unable to take oral antibiotics due to noncoverage of insurance and she has multiple allergies to multiple antibiotic medications.  Today the toe does not appear to be cellulitic or infected.  Past Medical History:  Diagnosis Date  . Anemia   . Arthritis   . Dysrhythmia    hx brief AF post op hip surg  . GERD (gastroesophageal reflux disease)   . Hypertension      Objective/Physical Exam General: The patient is alert and oriented x3 in no acute distress.  Dermatology:  Wound #1 noted to the left fourth toe measuring approximately 1.0 x 1.0 x 0.1 cm (LxWxD).   Wound #2 noted to the left great toe has healed.  Complete reepithelialization has occurred..  To the noted ulceration(s), there is no eschar. There is a moderate amount of slough, fibrin, and necrotic tissue noted. Granulation tissue and wound base is red. There is a minimal amount of serosanguineous drainage noted. There is no exposed bone muscle-tendon ligament or joint. There is no malodor. Periwound integrity is intact. Skin is warm, dry and supple bilateral lower extremities. Erythema significantly improved.  Vascular: Palpable pedal pulses bilaterally. Mild edema noted. Capillary refill within normal limits. Varicosities noted bilateral lower extremities.   Neurological: Epicritic and protective threshold absent bilaterally.   Musculoskeletal Exam: Range of motion within normal limits to all pedal and ankle joints bilateral. Muscle strength 5/5 in all groups bilateral.  Hammertoe contracture noted which is contributing to the ulcer on the distal tuft of the fourth digit left  foot  Assessment: #1 ulcer of the left fourth toe secondary to venous insufficiency  #2 varicosities bilateral lower extremities #3 flexible hammertoe contracture fourth digit left foot   Plan of Care:  #1 Patient was evaluated.  #2 medically necessary excisional debridement including subcutaneous tissue was performed using a tissue nipper and a chisel blade. Excisional debridement of all the necrotic nonviable tissue down to healthy bleeding viable tissue was performed with post-debridement measurements same as pre-. #3  The decision was made in the clinic today to perform a flexor tenotomy to alleviate the hammertoe deformity which is contributing to the ulcer on the distal tuft of the fourth digit left foot.  The toe was prepped in aseptic manner and a surgical #11 blade was utilized to create a small stab incision on the plantar aspect of the fourth digit at which time a transverse tenotomy was performed.  Dry sterile dressing was applied.  The fourth digit was in a more erect position with alleviation of the hammertoe deformity. #4 dry sterile dressing was applied.  Keep dressings clean dry and intact until follow-up visit next week.  Continue weightbearing in a postoperative shoe.   [name], DPM [facility]  Dr. [name], [place]                                        [place], [place] [address]  Office [phone]  Fax [phone]

## 2017-10-21 ENCOUNTER — Telehealth: Payer: Self-pay | Admitting: Sports Medicine

## 2017-10-21 NOTE — Telephone Encounter (Signed)
Patient's daughter in law called and states that when her mom got up to go to bathroom experienced bleeding through dressing. I encouraged daughter in law to re-inforce dressings with sterile guaze and ACE wrap, to ice, and elevate and to call back if bleeding continues. Daughter in law expressed understanding. -Dr. Cannon Kettle

## 2017-10-27 ENCOUNTER — Ambulatory Visit: Payer: Medicare Other | Admitting: Podiatry

## 2017-10-29 ENCOUNTER — Ambulatory Visit (INDEPENDENT_AMBULATORY_CARE_PROVIDER_SITE_OTHER): Payer: Medicare Other | Admitting: Podiatry

## 2017-10-29 DIAGNOSIS — L97522 Non-pressure chronic ulcer of other part of left foot with fat layer exposed: Secondary | ICD-10-CM | POA: Diagnosis not present

## 2017-10-29 DIAGNOSIS — I83025 Varicose veins of left lower extremity with ulcer other part of foot: Secondary | ICD-10-CM

## 2017-10-29 DIAGNOSIS — L97529 Non-pressure chronic ulcer of other part of left foot with unspecified severity: Secondary | ICD-10-CM | POA: Diagnosis not present

## 2017-11-02 NOTE — Progress Notes (Signed)
   Subjective:  Patient presents today status post flexor tenotomy left fourth toe. DOS: 10/20/17.  She is also here for follow-up evaluation of an ulceration of the left foot.  She states she is doing better.  She has no new complaints at this time.  Patient is here for further evaluation and treatment.   Past Medical History:  Diagnosis Date  . Anemia   . Arthritis   . Dysrhythmia    hx brief AF post op hip surg  . GERD (gastroesophageal reflux disease)   . Hypertension       Objective/Physical Exam Skin incisions appear to be well coapted with sutures and staples intact. No sign of infectious process noted. No dehiscence. No active bleeding noted. Moderate edema noted to the surgical extremity.  Wound #1 noted to the left fourth toe measuring approximately 0.4 x 0.4 x 0.1 cm.  To the above-noted ulceration, there is no eschar. There is a moderate amount of slough, fibrin and necrotic tissue. Granulation tissue and wound base is red. There is no malodor. There is a minimal amount of serosanginous drainage noted. Periwound integrity is intact.   Wound #2 noted to the left fifth toe measuring approximately 0.3 x 0.3 x 0.1 cm.  To the above-noted ulceration, there is no eschar. There is a moderate amount of slough, fibrin and necrotic tissue. Granulation tissue and wound base is red. There is no malodor. There is a minimal amount of serosanginous drainage noted. Periwound integrity is intact.    Assessment: 1. s/p flexor tenotomy left fourth toe. DOS: 10/20/17. 2.  Ulceration of the left fourth toe secondary to venous insufficiency 3.  Ulceration of the left fifth toe secondary to venous insufficiency   Plan of Care:  1. Patient was evaluated.  2. medically necessary excisional debridement including subcutaneous tissue was performed using a tissue nipper and a chisel blade. Excisional debridement of all the necrotic nonviable tissue down to healthy bleeding viable tissue was  performed with post-debridement measurements same as pre-. 3. the wound was cleansed and dry sterile dressing applied. 4.  Continue weightbearing in postop shoe. 5.  Return to clinic in 2 weeks.   [name], DPM [facility]  Dr. [name], [place]                                        [place], [place] [address]                Office [phone]  Fax tenotomy left fourth toe

## 2017-11-12 ENCOUNTER — Ambulatory Visit (INDEPENDENT_AMBULATORY_CARE_PROVIDER_SITE_OTHER): Payer: Medicare Other | Admitting: Podiatry

## 2017-11-12 DIAGNOSIS — L97529 Non-pressure chronic ulcer of other part of left foot with unspecified severity: Secondary | ICD-10-CM

## 2017-11-12 DIAGNOSIS — M2042 Other hammer toe(s) (acquired), left foot: Secondary | ICD-10-CM

## 2017-11-12 DIAGNOSIS — L97522 Non-pressure chronic ulcer of other part of left foot with fat layer exposed: Secondary | ICD-10-CM

## 2017-11-12 DIAGNOSIS — I83025 Varicose veins of left lower extremity with ulcer other part of foot: Secondary | ICD-10-CM

## 2017-11-19 NOTE — Progress Notes (Signed)
   Subjective:  Patient presents today status post flexor tenotomy left fourth toe. DOS: 10/20/17.  She is also here for follow-up evaluation of an ulceration of the left foot.  She states she is doing better.  She reports some intermittent soreness.  She has no new complaints at this time.  She is here for further evaluation and treatment.   Past Medical History:  Diagnosis Date  . Anemia   . Arthritis   . Dysrhythmia    hx brief AF post op hip surg  . GERD (gastroesophageal reflux disease)   . Hypertension       Objective/Physical Exam Skin incisions appear to be well coapted with sutures and staples intact. No sign of infectious process noted. No dehiscence. No active bleeding noted. Moderate edema noted to the surgical extremity. Wounds noted to the left fourth toe and left fifth toe have healed. Complete re-epithelialization has occurred. No drainage noted.    Assessment: 1. s/p flexor tenotomy left fourth toe. DOS: 10/20/17. 2.  Ulceration of the left fourth toe secondary to venous insufficiency - resolved 3.  Ulceration of the left fifth toe secondary to venous insufficiency - resolved   Plan of Care:  1. Patient was evaluated.  2.  Resume wearing good shoe gear.  Discontinue wearing postop shoe. 3.  Return to clinic as needed.  [name], DPM [facility]  Dr. [name], [place]                                        [place],  [address]                Office [phone]  Fax tenotomy left fourth toe

## 2017-12-03 ENCOUNTER — Ambulatory Visit (INDEPENDENT_AMBULATORY_CARE_PROVIDER_SITE_OTHER): Payer: Medicare Other | Admitting: Podiatry

## 2017-12-03 ENCOUNTER — Encounter: Payer: Self-pay | Admitting: Podiatry

## 2017-12-03 DIAGNOSIS — L97522 Non-pressure chronic ulcer of other part of left foot with fat layer exposed: Secondary | ICD-10-CM

## 2017-12-03 DIAGNOSIS — I83025 Varicose veins of left lower extremity with ulcer other part of foot: Secondary | ICD-10-CM

## 2017-12-03 DIAGNOSIS — L97529 Non-pressure chronic ulcer of other part of left foot with unspecified severity: Secondary | ICD-10-CM

## 2017-12-07 NOTE — Progress Notes (Signed)
   Subjective:  Patient presents today for evaluation a new complaint of a callus lesion to the left fifth toe and a blister to the left second toe that have been present for the past week. She has not done anything to treat the areas. There are no modifying factors noted. Patient is here for further evaluation and treatment.   Past Medical History:  Diagnosis Date  . Anemia   . Arthritis   . Dysrhythmia    hx brief AF post op hip surg  . GERD (gastroesophageal reflux disease)   . Hypertension      Objective/Physical Exam General: The patient is alert and oriented x3 in no acute distress.  Dermatology:  Wound #1 noted to the left third toe measuring 0.4 x 1.1 x 0.1 cm (LxWxD).   To the noted ulceration(s), there is no eschar. There is a moderate amount of slough, fibrin, and necrotic tissue noted. Granulation tissue and wound base is red. There is a minimal amount of serosanguineous drainage noted. There is no exposed bone muscle-tendon ligament or joint. There is no malodor. Periwound integrity is intact. Skin is warm, dry and supple bilateral lower extremities.  Vascular: Palpable pedal pulses bilaterally. Mild edema noted. Capillary refill within normal limits. Varicosities noted bilateral lower extremities.   Neurological: Epicritic and protective threshold absent bilaterally.   Musculoskeletal Exam: Range of motion within normal limits to all pedal and ankle joints bilateral. Muscle strength 5/5 in all groups bilateral.   Assessment: #1 ulceration left third toe secondary to venous insufficiency #2 varicosities bilateral lower extremities  Plan of Care:  #1 Patient was evaluated. #2 medically necessary excisional debridement including subcutaneous tissue was performed using a tissue nipper and a chisel blade. Excisional debridement of all the necrotic nonviable tissue down to healthy bleeding viable tissue was performed with post-debridement measurements same as pre-. #3 the  wound was cleansed with normal saline. #4 Recommended gentamicin cream daily with a Band-Aid. #5 Silicone toe cap dispensed. #6 Continue DM shoes. Today we will request authorization from insurance to approve DM shoes with DM insoles. This would be medically necessary. #7 Return to clinic in 2 weeks.  [name], DPM [facility]  Dr. [name], [place]                                        [place], [place] [address]                Office [phone]  Fax [phone]

## 2017-12-07 NOTE — Progress Notes (Signed)
  Subjective:  Patient ID: [name], female    DOB: Mar 31, 1928,  MRN: [id number]  Chief Complaint  Patient presents with  . Foot Ulcer    Left 3rd toe, was healing well, but now has changes  . Callouses    blister, pre-ulcer great toe left, possibly from silicone spacer rubbing   84 y.o. female returns for the above complaint.  States that she has a blister to the left great toe believed to be from her silicone spacer orts changes to the right third toe  Objective:  There were no vitals filed for this visit. General AA&O x3. Normal mood and affect.  Vascular Pedal pulses palpable.  Neurologic Epicritic sensation grossly intact.  Dermatologic Blister L great toe with serous fluid R 3rd toe stable ulceration  Orthopedic: No pain to palpation either foot.   Assessment & Plan:  Patient was evaluated and treated and all questions answered.  Blister L Great toe -Drained with 18 g needle -Dressed with betadine, DSD. -Keep f/u with [name].

## 2017-12-09 DIAGNOSIS — Z7901 Long term (current) use of anticoagulants: Secondary | ICD-10-CM | POA: Diagnosis not present

## 2017-12-09 DIAGNOSIS — M4800 Spinal stenosis, site unspecified: Secondary | ICD-10-CM | POA: Diagnosis not present

## 2017-12-09 DIAGNOSIS — E1151 Type 2 diabetes mellitus with diabetic peripheral angiopathy without gangrene: Secondary | ICD-10-CM | POA: Diagnosis not present

## 2017-12-09 DIAGNOSIS — Z1389 Encounter for screening for other disorder: Secondary | ICD-10-CM | POA: Diagnosis not present

## 2017-12-09 DIAGNOSIS — E11621 Type 2 diabetes mellitus with foot ulcer: Secondary | ICD-10-CM | POA: Diagnosis not present

## 2017-12-09 DIAGNOSIS — Z6821 Body mass index (BMI) 21.0-21.9, adult: Secondary | ICD-10-CM | POA: Diagnosis not present

## 2017-12-09 DIAGNOSIS — I7389 Other specified peripheral vascular diseases: Secondary | ICD-10-CM | POA: Diagnosis not present

## 2017-12-09 DIAGNOSIS — I480 Paroxysmal atrial fibrillation: Secondary | ICD-10-CM | POA: Diagnosis not present

## 2017-12-09 DIAGNOSIS — R2689 Other abnormalities of gait and mobility: Secondary | ICD-10-CM | POA: Diagnosis not present

## 2017-12-10 ENCOUNTER — Ambulatory Visit: Payer: Medicare Other | Admitting: Orthotics

## 2017-12-10 DIAGNOSIS — L97522 Non-pressure chronic ulcer of other part of left foot with fat layer exposed: Secondary | ICD-10-CM

## 2017-12-10 DIAGNOSIS — L84 Corns and callosities: Secondary | ICD-10-CM

## 2017-12-10 NOTE — Progress Notes (Signed)
Added buttress underneath ipj to help offload distal ends of toes (hammer)

## 2017-12-11 DIAGNOSIS — M5136 Other intervertebral disc degeneration, lumbar region: Secondary | ICD-10-CM | POA: Insufficient documentation

## 2017-12-12 ENCOUNTER — Other Ambulatory Visit: Payer: Self-pay

## 2017-12-12 DIAGNOSIS — I70229 Atherosclerosis of native arteries of extremities with rest pain, unspecified extremity: Secondary | ICD-10-CM

## 2017-12-12 DIAGNOSIS — I998 Other disorder of circulatory system: Secondary | ICD-10-CM

## 2017-12-12 DIAGNOSIS — I739 Peripheral vascular disease, unspecified: Secondary | ICD-10-CM

## 2017-12-17 ENCOUNTER — Ambulatory Visit (INDEPENDENT_AMBULATORY_CARE_PROVIDER_SITE_OTHER): Payer: Medicare Other | Admitting: Podiatry

## 2017-12-17 DIAGNOSIS — L97522 Non-pressure chronic ulcer of other part of left foot with fat layer exposed: Secondary | ICD-10-CM

## 2017-12-17 DIAGNOSIS — I83025 Varicose veins of left lower extremity with ulcer other part of foot: Secondary | ICD-10-CM

## 2017-12-17 DIAGNOSIS — L97529 Non-pressure chronic ulcer of other part of left foot with unspecified severity: Secondary | ICD-10-CM

## 2017-12-20 NOTE — Progress Notes (Signed)
   HPI: 84 year old female presenting today for follow up evaluation of an ulceration to the left foot secondary to venous insufficiency. She states she is doing better. She states the adjustments to her left shoe made by Liliane Channel helped significantly. She has no new complaints at this time. Patient is here for further evaluation and treatment.   Past Medical History:  Diagnosis Date  . Anemia   . Arthritis   . Dysrhythmia    hx brief AF post op hip surg  . GERD (gastroesophageal reflux disease)   . Hypertension      Physical Exam: General: The patient is alert and oriented x3 in no acute distress.  Dermatology: Skin is warm, dry and supple bilateral lower extremities. Negative for open lesions or macerations. Wound noted to the left foot has healed. Complete re-epithelialization has occurred. No drainage noted.   Vascular: Palpable pedal pulses bilaterally. No edema or erythema noted. Capillary refill within normal limits.  Neurological: Epicritic and protective threshold grossly intact bilaterally.   Musculoskeletal Exam: Range of motion within normal limits to all pedal and ankle joints bilateral. Muscle strength 5/5 in all groups bilateral.   Assessment: - Ulceration of the left foot secondary to venous insufficiency - healed   Plan of Care:  - Patient evaluated.  - Continue wearing DM shoe gear. - Appointment with Liliane Channel for DM shoe modification.  - Return to clinic as needed.   [name], DPM [facility]  Dr. [name], DPM    [address], [place] [address]                Office [phone]  Fax [phone]

## 2017-12-25 DIAGNOSIS — M5136 Other intervertebral disc degeneration, lumbar region: Secondary | ICD-10-CM | POA: Diagnosis not present

## 2017-12-31 ENCOUNTER — Other Ambulatory Visit: Payer: Self-pay | Admitting: Interventional Radiology

## 2017-12-31 DIAGNOSIS — I739 Peripheral vascular disease, unspecified: Secondary | ICD-10-CM

## 2018-01-06 DIAGNOSIS — E1151 Type 2 diabetes mellitus with diabetic peripheral angiopathy without gangrene: Secondary | ICD-10-CM | POA: Diagnosis not present

## 2018-01-06 DIAGNOSIS — Z6822 Body mass index (BMI) 22.0-22.9, adult: Secondary | ICD-10-CM | POA: Diagnosis not present

## 2018-01-06 DIAGNOSIS — R2689 Other abnormalities of gait and mobility: Secondary | ICD-10-CM | POA: Diagnosis not present

## 2018-01-06 DIAGNOSIS — I480 Paroxysmal atrial fibrillation: Secondary | ICD-10-CM | POA: Diagnosis not present

## 2018-01-06 DIAGNOSIS — I7389 Other specified peripheral vascular diseases: Secondary | ICD-10-CM | POA: Diagnosis not present

## 2018-01-06 DIAGNOSIS — Z7901 Long term (current) use of anticoagulants: Secondary | ICD-10-CM | POA: Diagnosis not present

## 2018-01-06 DIAGNOSIS — M4800 Spinal stenosis, site unspecified: Secondary | ICD-10-CM | POA: Diagnosis not present

## 2018-01-20 ENCOUNTER — Ambulatory Visit (INDEPENDENT_AMBULATORY_CARE_PROVIDER_SITE_OTHER): Payer: Medicare Other | Admitting: Vascular Surgery

## 2018-01-20 ENCOUNTER — Ambulatory Visit (HOSPITAL_COMMUNITY)
Admission: RE | Admit: 2018-01-20 | Discharge: 2018-01-20 | Disposition: A | Discharge status: Home / Self Care | Payer: Medicare Other | Source: Ambulatory Visit | Attending: Vascular Surgery | Admitting: Vascular Surgery

## 2018-01-20 ENCOUNTER — Encounter: Payer: Self-pay | Admitting: Vascular Surgery

## 2018-01-20 ENCOUNTER — Other Ambulatory Visit: Payer: Self-pay

## 2018-01-20 VITALS — BP 184/96 | HR 64 | Temp 97.0°F | Resp 16 | Ht 63.0 in | Wt 111.6 lb

## 2018-01-20 DIAGNOSIS — I739 Peripheral vascular disease, unspecified: Secondary | ICD-10-CM | POA: Diagnosis not present

## 2018-01-20 DIAGNOSIS — I70229 Atherosclerosis of native arteries of extremities with rest pain, unspecified extremity: Secondary | ICD-10-CM

## 2018-01-20 DIAGNOSIS — I998 Other disorder of circulatory system: Secondary | ICD-10-CM | POA: Diagnosis not present

## 2018-01-20 NOTE — Progress Notes (Signed)
Vascular and Vein Specialist of [facility]  Patient name: [name] MRN: [id number] DOB: 15-Dec-1927 Sex: female  REASON FOR VISIT: Evaluation of lower extreme arterial insufficiency  HPI: [name] is a 84 y.o. female here today for follow-up.  She is well-known to me from prior evacuation of a huge right groin hematoma requiring evacuation in May 2018.  She had undergone left SFA angioplasty and stenting 2 days prior to presenting to the emergency room with a hematoma.  She had ulceration on her toes with indication for treatment with interventional radiology.  She eventually healed the hematoma evacuation.  She is here today with concerns regarding arterial flow to her left foot.  She does have a chronic punctate area that is not healed on her fifth toe on the lateral dorsum aspect of her toe.  No surrounding erythema.  She denies any arterial rest pain and does not have any claudication symptoms.  She is very limited in her walking ability and walks with a rolling walker due to difficulty with balance and also generalized weakness and arthritic conditions.  Past Medical History:  Diagnosis Date  . Anemia   . Arthritis   . Dysrhythmia    hx brief AF post op hip surg  . GERD (gastroesophageal reflux disease)   . Hypertension     Family History  Problem Relation Age of Onset  . Cancer Mother     SOCIAL HISTORY: Social History   Tobacco Use  . Smoking status: Never Smoker  . Smokeless tobacco: Never Used  Substance Use Topics  . Alcohol use: No    Allergies  Allergen Reactions  . Amprenavir Other (See Comments)    LISTED ON PATIENT'S MAR  . Bactrim [Sulfamethoxazole-Trimethoprim] Nausea Only  . Ceftriaxone Sodium Other (See Comments)    Tingly feeling after given, then one hour later while vanco infusing pruritic rash  . Phenergan [Promethazine Hcl] Other (See Comments)    Hallucinations  . Sulfonamide Derivatives Hives  .  Vancomycin Hcl Rash    Rash happened 1hr after rocephin but immediately after infusing vancomycin    Current Outpatient Medications  Medication Sig Dispense Refill  . acetaminophen (TYLENOL) 325 MG tablet Take 650 mg by mouth daily.     Marland Kitchen aspirin EC 325 MG tablet Take 325 mg by mouth at bedtime.    . calcium carbonate (OSCAL) 1500 (600 Ca) MG TABS tablet Take 1 tablet by mouth daily with breakfast.     . docusate sodium (COLACE) 100 MG capsule Take 1 capsule (100 mg total) by mouth daily. 10 capsule 0  . ferrous sulfate 325 (65 FE) MG tablet Take 325 mg by mouth daily with breakfast.    . gentamicin cream (GARAMYCIN) 0.1 % Apply 1 application topically 3 (three) times daily. 30 g 1  . guaiFENesin-dextromethorphan (ROBITUSSIN DM) 100-10 MG/5ML syrup Take 15 mLs by mouth every 4 (four) hours as needed for cough. 118 mL 0  . magnesium hydroxide (MILK OF MAGNESIA) 400 MG/5ML suspension Take 45 mLs by mouth See admin instructions. AS NEEDED FOR 2 DAYS TO STOP ON 04/13/17    . metoprolol succinate (TOPROL-XL) 25 MG 24 hr tablet Take 25 mg by mouth at bedtime.     . Multiple Vitamin (MULTIVITAMIN) tablet Take 1 tablet by mouth daily.    . pantoprazole (PROTONIX) 40 MG tablet Take 1 tablet (40 mg total) by mouth daily. 30 tablet 1  . vitamin B-12 (CYANOCOBALAMIN) 1000 MCG tablet Take 1,000 mcg  by mouth daily.    . Vitamin D, Ergocalciferol, (DRISDOL) 50000 units CAPS capsule Take 50,000 Units by mouth every 7 (seven) days. Sundays    . warfarin (COUMADIN) 3 MG tablet Take 1 tablet (3 mg total) by mouth daily at 6 PM. FOR A-FIB 30 tablet 1   No current facility-administered medications for this visit.     REVIEW OF SYSTEMS:  [X]  denotes positive finding, [ ]  denotes negative finding Cardiac  Comments:  Chest pain or chest pressure:    Shortness of breath upon exertion:    Short of breath when lying flat:    Irregular heart rhythm:        Vascular    Pain in calf, thigh, or hip brought on by  ambulation:    Pain in feet at night that wakes you up from your sleep:     Blood clot in your veins:    Leg swelling:           PHYSICAL EXAM: Vitals:   01/20/18 1409  BP: (!) 184/96  Pulse: 64  Resp: 16  Temp: (!) 97 F (36.1 C)  TempSrc: Oral  SpO2: 98%  Weight: 111 lb 9.6 oz (50.6 kg)  Height: 5\' 3"  (1.6 m)    GENERAL: The patient is a well-nourished female, in no acute distress. The vital signs are documented above. CARDIOVASCULAR: Palpable femoral pulses with well-healed right groin incision.  Palpable right popliteal and absent left popliteal pulse.  I do not palpate pedal pulses bilaterally PULMONARY: There is good air exchange  MUSCULOSKELETAL: There are no major deformities or cyanosis. NEUROLOGIC: No focal weakness or paresthesias are detected. SKIN: There are no ulcers or rashes noted.  Small punctate area over her fifth toe. PSYCHIATRIC: The patient has a normal affect.  DATA:  Basis studies today revealed calcified incompressible lower extremity arteries making ankle arm index unreliable.  She does have monophasic flow in her left anterior tibial and posterior tibial arteries.  Biphasic flow on the right.  MEDICAL ISSUES: Stable overall.  Suspect that she has occluded her left superficial femoral artery stent.  She does not have a palpable left popliteal pulse.  She does not have any threatening tissue loss currently on her left foot.  No resting symptoms of arterial insufficiency so therefore would recommend continued observation only.  She and her family are comfortable with this discussion will see [place] again on an as-needed basis    Rosetta Posner, MD [facility] Vascular and Vein Specialists of [facility] Tel [phone] Pager [phone]

## 2018-01-22 ENCOUNTER — Ambulatory Visit
Admission: RE | Admit: 2018-01-22 | Discharge: 2018-01-22 | Disposition: A | Discharge status: Home / Self Care | Payer: Medicare Other | Source: Ambulatory Visit | Attending: Interventional Radiology | Admitting: Interventional Radiology

## 2018-01-22 ENCOUNTER — Encounter: Payer: Self-pay | Admitting: *Deleted

## 2018-01-22 DIAGNOSIS — I739 Peripheral vascular disease, unspecified: Secondary | ICD-10-CM

## 2018-01-22 HISTORY — PX: IR RADIOLOGIST EVAL & MGMT: IMG5224

## 2018-01-22 NOTE — Progress Notes (Signed)
Chief Complaint: Peripheral Vascular Disease  Referring Physician(s): [name], [facility]  History of Present Illness: [name] is a 84 y.o. female presenting as a scheduled follow up to Vascular & Interventional Radiology, now about 10 months SP treatment of left fem-pop occlusion for a left foot wound.    We treated her with endovascular approach to a chronic occlusion, which was successfully opened up with stenting.  She had an episode shortly after, however, where she required overnight tPA lysis for acute thrombosis, occurring within a few weeks.  After this was successfully treated to restore flow, she was discharged home on both anti-platelet medication and anti-coagulation for Afib risk-reduction.  A right hip hematoma occurred 2-3 days after her DC, which again required a surgical evacuation.    Since this episode, she continues to see [name] for foot care.  She has successfully healed her foot wound, and at this time she tells me that she has not had any pain in her foot.  Today, she is pointing out a small callous on the plantar tip of second toe, as well as a callous on the fifth toe with a small scab.  She has no evidence of erythema.  Her DP and PT are not palpable, but she has a strong doppler signal at both PT and DP.  No intertriginous wounds.   Her daughter tells me that she has upcoming appointment with [name] for routine care of her foot.    She continues with anti-coagulation and 81mg  ASA daily.  She has not had any recurrent bleeding.    Recently she has had a non-invasive exam, which shows ABI on the left in the severe range of occlusive disease.  I can only see the values, and not the waveforms.  She did not have a complete segmental exam or a duplex exam.      Past Medical History:  Diagnosis Date  . Anemia   . Arthritis   . Dysrhythmia    hx brief AF post op hip surg  . GERD (gastroesophageal reflux disease)   . Hypertension      Past Surgical History:  Procedure Laterality Date  . AMPUTATION Right 02/04/2013   Procedure: [name] ;  Surgeon: Wylene Simmer, MD;  Location: [facility];  Service: Orthopedics;  Laterality: Right;  . APPENDECTOMY    . COLONOSCOPY    . EYE SURGERY     both cataracts  . FEMORAL ARTERY EXPLORATION Right 04/12/2017   Procedure: EVACUATION HEMATOMA RIGHT GROIN WITH FEMORAL ARTERY REPAIR;  Surgeon: Rosetta Posner, MD;  Location: [facility];  Service: Vascular;  Laterality: Right;  . INCISION AND DRAINAGE  2011   left hip inf-hemovac  . IR ANGIOGRAM EXTREMITY LEFT  03/25/2017  . IR ANGIOGRAM EXTREMITY LEFT  04/08/2017  . IR ANGIOGRAM SELECTIVE EACH ADDITIONAL VESSEL  03/25/2017  . IR FEM POP ART STENT INC PTA MOD SED  03/25/2017  . IR INFUSION THROMBOL ARTERIAL INITIAL (MS)  04/08/2017  . IR RADIOLOGIST EVAL & MGMT  02/26/2017  . IR RADIOLOGIST EVAL & MGMT  04/03/2017  . IR RADIOLOGIST EVAL & MGMT  08/20/2017  . IR RADIOLOGIST EVAL & MGMT  01/22/2018  . IR THROMB F/U EVAL ART/VEN FINAL DAY (MS)  04/09/2017  . IR TIB-PERO ART PTA MOD SED  03/25/2017  . IR US GUIDE VASC ACCESS RIGHT  03/25/2017  . IR US GUIDE VASC ACCESS RIGHT  04/08/2017  .  KNEE ARTHROSCOPY  1995   left  . NECK MASS EXCISION    . TONSILLECTOMY    . TOTAL HIP ARTHROPLASTY  2006   left-fx  . TOTAL KNEE ARTHROPLASTY  5009,3818   left  . TOTAL KNEE ARTHROPLASTY  2009   right    Allergies: Amprenavir; Bactrim [sulfamethoxazole-trimethoprim]; Ceftriaxone sodium; Phenergan [promethazine hcl]; Sulfonamide derivatives; and Vancomycin hcl  Medications: Prior to Admission medications   Medication Sig Start Date End Date Taking? Authorizing Provider  acetaminophen (TYLENOL) 325 MG tablet Take 650 mg by mouth daily.     [provider]  aspirin EC 325 MG tablet Take 325 mg by mouth at bedtime.    [provider]  calcium carbonate (OSCAL) 1500 (600 Ca) MG TABS tablet Take 1 tablet by mouth daily  with breakfast.     [provider]  docusate sodium (COLACE) 100 MG capsule Take 1 capsule (100 mg total) by mouth daily. 04/17/17   Reyne Dumas, MD  ferrous sulfate 325 (65 FE) MG tablet Take 325 mg by mouth daily with breakfast.    [provider]  gentamicin cream (GARAMYCIN) 0.1 % Apply 1 application topically 3 (three) times daily. 08/28/17   [name], DPM  guaiFENesin-dextromethorphan (ROBITUSSIN DM) 100-10 MG/5ML syrup Take 15 mLs by mouth every 4 (four) hours as needed for cough. 04/17/17   Reyne Dumas, MD  magnesium hydroxide (MILK OF MAGNESIA) 400 MG/5ML suspension Take 45 mLs by mouth See admin instructions. AS NEEDED FOR 2 DAYS TO STOP ON 04/13/17    [provider]  metoprolol succinate (TOPROL-XL) 25 MG 24 hr tablet Take 25 mg by mouth at bedtime.     [provider]  Multiple Vitamin (MULTIVITAMIN) tablet Take 1 tablet by mouth daily.    [provider]  pantoprazole (PROTONIX) 40 MG tablet Take 1 tablet (40 mg total) by mouth daily. 04/17/17   Reyne Dumas, MD  vitamin B-12 (CYANOCOBALAMIN) 1000 MCG tablet Take 1,000 mcg by mouth daily.    [provider]  Vitamin D, Ergocalciferol, (DRISDOL) 50000 units CAPS capsule Take 50,000 Units by mouth every 7 (seven) days. Sundays    [provider]  warfarin (COUMADIN) 3 MG tablet Take 1 tablet (3 mg total) by mouth daily at 6 PM. FOR A-FIB 04/18/17   Reyne Dumas, MD     Family History  Problem Relation Age of Onset  . Cancer Mother     Social History   Socioeconomic History  . Marital status: Married    Spouse name: Not on file  . Number of children: Not on file  . Years of education: Not on file  . Highest education level: Not on file  Social Needs  . Financial resource strain: Not on file  . Food insecurity - worry: Not on file  . Food insecurity - inability: Not on file  . Transportation needs - medical: Not on file  . Transportation needs -  non-medical: Not on file  Occupational History  . Not on file  Tobacco Use  . Smoking status: Never Smoker  . Smokeless tobacco: Never Used  Substance and Sexual Activity  . Alcohol use: No  . Drug use: No  . Sexual activity: Not on file  Other Topics Concern  . Not on file  Social History Narrative  . Not on file     Review of Systems: A 12 point ROS discussed and pertinent positives are indicated in the HPI above.  All other systems  are negative.  Review of Systems  Vital Signs: BP (!) 166/98   Pulse 77   Temp 98.1 F (36.7 C) (Oral)   Resp 16   Ht 5\' 3"  (1.6 m)   Wt 112 lb (50.8 kg)   SpO2 98%   BMI 19.84 kg/m   Physical Exam   Target exam of the left foot shows non-palpable pulses, but DP and PT are strong doppler signal.   No erythema.  Small callous on the dorsum of the fifth toe laterally, with scab.  No evidence of infection.  No intertriginous lesions.   Mallampati Score:     Imaging: Ir Radiologist Eval & Mgmt  Result Date: 01/22/2018 Please refer to notes tab for details about interventional procedure. (Op Note)   Labs:  CBC: Recent Labs    04/12/17 1452 04/12/17 1628 04/13/17 0022 04/14/17 0946 04/15/17 1410  WBC 14.1*  --  20.1* 15.5* 11.7*  HGB 10.6* 10.5* 11.7* 9.8* 10.0*  HCT 33.1* 31.0* 35.2* 30.1* 31.2*  PLT 239  --  183 180 207    COAGS: Recent Labs    03/25/17 0645  04/14/17 0244 04/15/17 0157 04/16/17 0213 04/17/17 0207  INR 1.16   < > 1.97 1.44 1.20 1.25  APTT 32  --   --   --   --   --    < > = values in this interval not displayed.    BMP: Recent Labs    04/07/17 0850 04/12/17 1452 04/12/17 1628 04/13/17 0022 04/15/17 0157  NA 138 140 142 135 137  K 3.6 3.7 4.8 4.5 3.8  CL 101 106  --  105 107  CO2 25 22  --  22 24  GLUCOSE 314* 309* 342* 354* 153*  BUN 16 16  --  24* 20  CALCIUM 9.0 9.1  --  8.5* 8.2*  CREATININE 0.83 1.05*  --  1.25* 0.83  GFRNONAA >60 47*  --  38* >60  GFRAA >60 54*  --  44* >60     LIVER FUNCTION TESTS: Recent Labs    03/18/17 1138 04/15/17 0157  BILITOT 1.5* 1.1  AST 23 23  ALT 24 22  ALKPHOS 77 46  PROT 6.8 4.4*  ALBUMIN 3.5 2.4*    TUMOR MARKERS: No results for input(s): AFPTM, CEA, CA199, CHROMGRNA in the last 8760 hours.  Assessment and Plan:  Ms Bosko is an 84 yo female with known PAD and prior left fem-pop occlusion treated with endovascular approach, with stenting in early May of 2018.  Her post op course was complicated by an episode of left leg thrombosis and subsequent successful lysis, and then an interval right hip hematoma evacuted surgically.  Since that time she tells me that she is satisfied that her left foot wound has healed.  She denies any symptoms of rest pain, despite a recent ABI showing severe range of occlusive disease.    Pulses are maintained on doppler.   I had a lengthy discussion regarding possible options with her and her daughter.  My impression is that she is currently asymptomatic despite the compromised ABI and possible stent compromise.  I told her I would not be aggressive about trying to improve her flow, as the risk outweighs the benefit at this time.    I recommended continue medical therapy, and surveillance.  She and her daughter understand, and agree with conservative measures.    We will see her back in 1 year with repeat non-invasive exam and office visit.  She has plans to continue active care with [name].   Plan: - 1 year follow up visit with non-invasive exam - I have encouraged her to maintain her future doctors appointments - Continue medical therapy - She knows she may contact us at any time if she has any problems.       Electronically Signed: Corrie Mckusick 01/22/2018, 1:04 PM   I spent a total of    25 Minutes in face to face in clinical consultation, greater than 50% of which was counseling/coordinating care for left lower extremity CLI, with prior endovascular treatment.

## 2018-02-05 DIAGNOSIS — Z7901 Long term (current) use of anticoagulants: Secondary | ICD-10-CM | POA: Diagnosis not present

## 2018-02-05 DIAGNOSIS — I480 Paroxysmal atrial fibrillation: Secondary | ICD-10-CM | POA: Diagnosis not present

## 2018-02-09 ENCOUNTER — Ambulatory Visit (INDEPENDENT_AMBULATORY_CARE_PROVIDER_SITE_OTHER): Payer: Medicare Other | Admitting: Podiatry

## 2018-02-09 ENCOUNTER — Encounter: Payer: Self-pay | Admitting: Podiatry

## 2018-02-09 DIAGNOSIS — L97529 Non-pressure chronic ulcer of other part of left foot with unspecified severity: Secondary | ICD-10-CM

## 2018-02-09 DIAGNOSIS — I83025 Varicose veins of left lower extremity with ulcer other part of foot: Secondary | ICD-10-CM

## 2018-02-09 DIAGNOSIS — L97522 Non-pressure chronic ulcer of other part of left foot with fat layer exposed: Secondary | ICD-10-CM | POA: Diagnosis not present

## 2018-02-12 NOTE — Progress Notes (Signed)
   Subjective:  84 year old female presenting today with a chief complaint of corns noted to the left fifth toe that appeared a few weeks ago. She reports a blistered area to the distal tip of the left 2nd toe. She reports associated pain to the areas. She has not done anything to treat the areas. Applying pressure or touching the areas increases the pain. Patient is here for further evaluation and treatment.   Past Medical History:  Diagnosis Date  . Anemia   . Arthritis   . Dysrhythmia    hx brief AF post op hip surg  . GERD (gastroesophageal reflux disease)   . Hypertension      Objective/Physical Exam General: The patient is alert and oriented x3 in no acute distress.  Dermatology:  Wound #1 noted to the left 5th toe measuring 0.8 x 0.8 x 0.2 cm (LxWxD).   Wound #2 noted to the left 2nd toe measuring 0.3 x 1.2 x 0.2 cm.  To the noted ulceration(s), there is no eschar. There is a moderate amount of slough, fibrin, and necrotic tissue noted. Granulation tissue and wound base is red. There is a minimal amount of serosanguineous drainage noted. There is no exposed bone muscle-tendon ligament or joint. There is no malodor. Periwound integrity is intact. Skin is warm, dry and supple bilateral lower extremities.  Vascular: Palpable pedal pulses bilaterally. Mild edema noted. Capillary refill within normal limits. Varicosities noted bilateral lower extremities.   Neurological: Epicritic and protective threshold absent bilaterally.   Musculoskeletal Exam: Range of motion within normal limits to all pedal and ankle joints bilateral. Muscle strength 5/5 in all groups bilateral.   Assessment: #1 ulceration of the left fifth toe secondary to venous insufficiency #2 ulceration of the left second toe secondary to venous insufficiency  #3 varicosities bilateral lower extremities  Plan of Care:  #1 Patient was evaluated. #2 medically necessary excisional debridement including subcutaneous  tissue was performed using a tissue nipper and a chisel blade. Excisional debridement of all the necrotic nonviable tissue down to healthy bleeding viable tissue was performed with post-debridement measurements same as pre-. #3 the wound was cleansed with normal saline. #4 Dry sterile dressing applied.  #5 Recommended gentamicin cream daily with a Band-Aid.  #6 Return to wearing post op shoe. Weightbearing as tolerated.  #7 Return to clinic in 2 weeks.    [name], DPM [facility]  Dr. [name], [address]                                        [place], [place] [address]                Office [phone]  Fax [phone]

## 2018-02-13 ENCOUNTER — Ambulatory Visit: Payer: Medicare Other | Admitting: Podiatry

## 2018-02-25 ENCOUNTER — Ambulatory Visit: Payer: Medicare Other | Admitting: Podiatry

## 2018-02-27 ENCOUNTER — Ambulatory Visit (INDEPENDENT_AMBULATORY_CARE_PROVIDER_SITE_OTHER): Payer: Medicare Other | Admitting: Podiatry

## 2018-02-27 ENCOUNTER — Encounter: Payer: Self-pay | Admitting: Podiatry

## 2018-02-27 DIAGNOSIS — M2042 Other hammer toe(s) (acquired), left foot: Secondary | ICD-10-CM

## 2018-02-27 NOTE — Patient Instructions (Signed)
Pre-Operative Instructions  Congratulations, you have decided to take an important step towards improving your quality of life.  You can be assured that the doctors and staff at [facility] will be with you every step of the way.  Here are some important things you should know:  1. Plan to be at the surgery center/hospital at least 1 (one) hour prior to your scheduled time, unless otherwise directed by the surgical center/hospital staff.  You must have a responsible adult accompany you, remain during the surgery and drive you home.  Make sure you have directions to the surgical center/hospital to ensure you arrive on time. 2. If you are having surgery at [facility] or [facility] hospitals, you will need a copy of your medical history and physical form from your family physician within one month prior to the date of surgery. We will give you a form for your primary physician to complete.  3. We make every effort to accommodate the date you request for surgery.  However, there are times where surgery dates or times have to be moved.  We will contact you as soon as possible if a change in schedule is required.   4. No aspirin/ibuprofen for one week before surgery.  If you are on aspirin, any non-steroidal anti-inflammatory medications (Mobic, Aleve, Ibuprofen) should not be taken seven (7) days prior to your surgery.  You make take Tylenol for pain prior to surgery.  5. Medications - If you are taking daily heart and blood pressure medications, seizure, reflux, allergy, asthma, anxiety, pain or diabetes medications, make sure you notify the surgery center/hospital before the day of surgery so they can tell you which medications you should take or avoid the day of surgery. 6. No food or drink after midnight the night before surgery unless directed otherwise by surgical center/hospital staff. 7. No alcoholic beverages 24-hours prior to surgery.  No smoking 24-hours prior or 24-hours after  surgery. 8. Wear loose pants or shorts. They should be loose enough to fit over bandages, boots, and casts. 9. Don't wear slip-on shoes. Sneakers are preferred. 10. Bring your boot with you to the surgery center/hospital.  Also bring crutches or a walker if your physician has prescribed it for you.  If you do not have this equipment, it will be provided for you after surgery. 11. If you have not been contacted by the surgery center/hospital by the day before your surgery, call to confirm the date and time of your surgery. 12. Leave-time from work may vary depending on the type of surgery you have.  Appropriate arrangements should be made prior to surgery with your employer. 13. Prescriptions will be provided immediately following surgery by your doctor.  Fill these as soon as possible after surgery and take the medication as directed. Pain medications will not be refilled on weekends and must be approved by the doctor. 14. Remove nail polish on the operative foot and avoid getting pedicures prior to surgery. 15. Wash the night before surgery.  The night before surgery wash the foot and leg well with water and the antibacterial soap provided. Be sure to pay special attention to beneath the toenails and in between the toes.  Wash for at least three (3) minutes. Rinse thoroughly with water and dry well with a towel.  Perform this wash unless told not to do so by your physician.  Enclosed: 1 Ice pack (please put in freezer the night before surgery)   1 Hibiclens skin cleaner     Pre-op instructions  If you have any questions regarding the instructions, please do not hesitate to call our office.  [place]: [address], [place], [place] [address] -- [phone]  [place]: [address] [place], [place] [address] -- [phone]  [place]: [address]  [place], [place] [address] -- [phone]  [place]: [address], [address], [place], [place] [address] -- [phone]  Website:  [email or web address] 

## 2018-03-02 ENCOUNTER — Telehealth: Payer: Self-pay | Admitting: *Deleted

## 2018-03-02 NOTE — Progress Notes (Signed)
   HPI: 84 year old female presenting today for follow up evaluation of ulcerations of the left foot. She states the wounds look well. She reports continued intermittent pain of the right fifth toe. Walking increases the pain. She has not done anything for treatment. Patient is here for further evaluation and treatment.   Past Medical History:  Diagnosis Date  . Anemia   . Arthritis   . Dysrhythmia    hx brief AF post op hip surg  . GERD (gastroesophageal reflux disease)   . Hypertension       Objective: Physical Exam General: The patient is alert and oriented x3 in no acute distress.  Dermatology: Skin is cool, dry and supple bilateral lower extremities. Negative for open lesions or macerations.  Vascular: Palpable pedal pulses bilaterally. No edema or erythema noted. Capillary refill within normal limits.  Neurological: Epicritic and protective threshold grossly intact bilaterally.   Musculoskeletal Exam: All pedal and ankle joints range of motion within normal limits bilateral. Muscle strength 5/5 in all groups bilateral. Hammertoe contracture deformity noted to the fifth digit of the left foot  Assessment: 1. Hammertoe left fifth digit   Plan of Care:  1. Patient evaluated.  2. Today we discussed the conservative versus surgical management of the presenting pathology. The patient opts for surgical management. All possible complications and details of the procedure were explained. All patient questions were answered. No guarantees were expressed or implied. 3. Authorization for surgery was initiated today. Surgery will consist of PIPJ arthroplasty with derotational skin plasty left 5th toe. Possible 4th EDL tenotomy left.  4. Return to clinic one week post op.      [name], DPM [facility]  Dr. [name], DPM    [address], [place] [address]                Office [phone]  Fax [phone]

## 2018-03-02 NOTE — Telephone Encounter (Signed)
"  I need to schedule my mother-in-laws surgery.  She's a patient of [name].  Please give me a call."

## 2018-03-09 DIAGNOSIS — Z7901 Long term (current) use of anticoagulants: Secondary | ICD-10-CM | POA: Diagnosis not present

## 2018-03-09 DIAGNOSIS — I4891 Unspecified atrial fibrillation: Secondary | ICD-10-CM | POA: Diagnosis not present

## 2018-03-10 NOTE — Telephone Encounter (Signed)
I attempted to return her call.  I left voice messages for her to call me back tomorrow.

## 2018-03-10 NOTE — Telephone Encounter (Signed)
"  I'm calling for my mother-in-law, Danyeal Akens. Tompkins.  [name] is requesting a surgery for her on her left foot.  He's asked me to call you and get a date scheduled for her.  Give me a call back.  I am her daughter-in-law.  I schedule all her appointments for her.  I look forward to talking to you as soon as possible."

## 2018-03-11 ENCOUNTER — Encounter: Payer: Self-pay | Admitting: *Deleted

## 2018-03-11 NOTE — Telephone Encounter (Signed)
"  I appreciate you calling me back yesterday.  My phone lost power so I didn't know you had called."  You want to set up Whitman Hero' surgery date?  "Yes, I would.  She can do it whenever his next available date is."  [name] can do it on May 9.  "That date is fine."  I will get it scheduled.  You can go on-line and register with the surgical center, instructions are in the brochure that was given to her.  They will contact you a day or two prior to her surgery date and give you her arrival time.

## 2018-03-20 ENCOUNTER — Telehealth: Payer: Self-pay | Admitting: *Deleted

## 2018-03-20 NOTE — Telephone Encounter (Signed)
I am calling in regards to [name]' scheduled surgery for 03/26/2018.  Unfortunately, [name] is not going to be able to perform her surgery on that date.  I'm calling to see if we can reschedule her to 04/03/2018.  "That will be fine."  I also want to inquire about her clearance to stop taking the Plavix.  Have you spoken to her doctor about that?  "Yes, I put a call in to Dr. Sharlett Iles.  We haven't heard back from him yet."  Can you let me know when you hear from him?  "Yes, I sure will.  Thank you so much [name].

## 2018-03-23 NOTE — Telephone Encounter (Signed)
"  You told me to call you and let you know if the doctor Esmae Donathan to be off her Plavix.  He told her to stop it on May 13."  Okay, thanks for letting me know.

## 2018-04-01 ENCOUNTER — Encounter: Payer: Medicare Other | Admitting: Podiatry

## 2018-04-03 ENCOUNTER — Encounter: Payer: Self-pay | Admitting: Podiatry

## 2018-04-03 DIAGNOSIS — M2042 Other hammer toe(s) (acquired), left foot: Secondary | ICD-10-CM | POA: Diagnosis not present

## 2018-04-03 DIAGNOSIS — K219 Gastro-esophageal reflux disease without esophagitis: Secondary | ICD-10-CM | POA: Diagnosis not present

## 2018-04-06 DIAGNOSIS — I4891 Unspecified atrial fibrillation: Secondary | ICD-10-CM | POA: Diagnosis not present

## 2018-04-06 DIAGNOSIS — Z7901 Long term (current) use of anticoagulants: Secondary | ICD-10-CM | POA: Diagnosis not present

## 2018-04-08 ENCOUNTER — Encounter: Payer: Self-pay | Admitting: Podiatry

## 2018-04-08 ENCOUNTER — Ambulatory Visit (INDEPENDENT_AMBULATORY_CARE_PROVIDER_SITE_OTHER): Payer: Medicare Other

## 2018-04-08 ENCOUNTER — Ambulatory Visit (INDEPENDENT_AMBULATORY_CARE_PROVIDER_SITE_OTHER): Payer: Medicare Other | Admitting: Podiatry

## 2018-04-08 VITALS — BP 170/89 | HR 75 | Resp 18

## 2018-04-08 DIAGNOSIS — M2042 Other hammer toe(s) (acquired), left foot: Secondary | ICD-10-CM

## 2018-04-08 DIAGNOSIS — Z9889 Other specified postprocedural states: Secondary | ICD-10-CM

## 2018-04-11 NOTE — Progress Notes (Signed)
   Subjective:  Patient presents today status post 2nd and 5th toe arthroplasty of the left foot. DOS: 04/02/18. She reports some mild intermittent pain. There are no modifying factors noted. She has been wearing the post op shoe as directed. Patient is here for further evaluation and treatment.    Past Medical History:  Diagnosis Date  . Anemia   . Arthritis   . Dysrhythmia    hx brief AF post op hip surg  . GERD (gastroesophageal reflux disease)   . Hypertension       Objective/Physical Exam Neurovascular status intact.  Skin incisions appear to be well coapted with sutures and staples intact. No sign of infectious process noted. No dehiscence. No active bleeding noted. Moderate edema noted to the surgical extremity.  Radiographic Exam:  Orthopedic hardware and osteotomies sites appear to be stable with routine healing.  Assessment: 1. s/p 2nd and 5th toe arthroplasty of the left foot. DOS: 04/02/18   Plan of Care:  1. Patient was evaluated. X-rays reviewed 2. Dressing changed.  3. Continue weightbearing in the post op shoe.  4. Return to clinic in one week.    [name], DPM [facility]  Dr. [name], [address]                                        [place], [place] [address]                Office [phone]  Fax [phone]

## 2018-04-15 ENCOUNTER — Ambulatory Visit (INDEPENDENT_AMBULATORY_CARE_PROVIDER_SITE_OTHER): Payer: Medicare Other | Admitting: Podiatry

## 2018-04-15 DIAGNOSIS — Z9889 Other specified postprocedural states: Secondary | ICD-10-CM

## 2018-04-15 DIAGNOSIS — M2042 Other hammer toe(s) (acquired), left foot: Secondary | ICD-10-CM

## 2018-04-19 NOTE — Progress Notes (Signed)
   Subjective:  Patient presents today status post 2nd and 5th toe arthroplasty of the left foot. DOS: 04/02/18. She states she is doing well. She reports a decreases in the pain. Patient is here for further evaluation and treatment.    Past Medical History:  Diagnosis Date  . Anemia   . Arthritis   . Dysrhythmia    hx brief AF post op hip surg  . GERD (gastroesophageal reflux disease)   . Hypertension       Objective/Physical Exam Neurovascular status intact.  Skin incisions appear to be well coapted with sutures and staples intact. No sign of infectious process noted. No dehiscence. No active bleeding noted. Moderate edema noted to the surgical extremity.  Assessment: 1. s/p 2nd and 5th toe arthroplasty of the left foot. DOS: 04/02/18   Plan of Care:  1. Patient was evaluated. 2. Sutures removed.  3. Recommended antibiotic ointment with a Band-Aid daily.  4. Continue weightbearing in post op shoe.  5. Return to clinic in 2 weeks.    [name], DPM [facility]  Dr. [name], [address]                                        [place], [place] [address]                Office [phone]  Fax [phone]

## 2018-04-29 ENCOUNTER — Ambulatory Visit (INDEPENDENT_AMBULATORY_CARE_PROVIDER_SITE_OTHER): Payer: Medicare Other | Admitting: Podiatry

## 2018-04-29 DIAGNOSIS — Z9889 Other specified postprocedural states: Secondary | ICD-10-CM

## 2018-04-29 NOTE — Progress Notes (Signed)
   HPI: 84 year old female presents the office today for evaluation status post second and fifth toe arthroplasty of the left foot.  Date of surgery 04/02/2018.  Patient states that she is doing well and she no longer has any pain or callus formations.  She presents today for further treatment evaluation.  She has been wearing a postsurgical shoe.  Past Medical History:  Diagnosis Date  . Anemia   . Arthritis   . Dysrhythmia    hx brief AF post op hip surg  . GERD (gastroesophageal reflux disease)   . Hypertension      Physical Exam: General: The patient is alert and oriented x3 in no acute distress.  Dermatology: Skin is warm, dry and supple bilateral lower extremities. Negative for open lesions or macerations.  There is some mild callus formation to the distal tuft of the second digit left foot.  No open lesions noted.  Vascular: Palpable pedal pulses bilaterally. No edema or erythema noted. Capillary refill within normal limits.  Neurological: Epicritic and protective threshold diminished bilaterally.   Musculoskeletal Exam: There is a dorsiflex deformity of the second toe left foot which could be concerning with closed toe shoe gear.  Deformity is flexible and reducible.  Absence of the third toe left foot noted.  Assessment: 1.  Dorsiflex second digit left foot. 2.  Status post second and fifth toe arthroplasty left foot.  Date of surgery 04/02/2018.   Plan of Care:  1. Patient evaluated.  2.  Darco toe splint provided to lateral flex the second digit left foot in a more rectus position 3.  Resume good supportive shoe gear.  Recommend buying a new pair of shoes that are wide with and have plenty of room for the toe box area 4.  Return to clinic in 3 months for follow-up      [name], DPM [facility]  Dr. [name], DPM    [address], [place] [address]                Office [phone]  Fax  [phone]

## 2018-05-01 NOTE — Progress Notes (Signed)
DOS  04/03/2018  Hammer toe arthroscopy with derotational skin plasty fifth toe left.

## 2018-05-04 DIAGNOSIS — Z7901 Long term (current) use of anticoagulants: Secondary | ICD-10-CM | POA: Diagnosis not present

## 2018-05-04 DIAGNOSIS — I480 Paroxysmal atrial fibrillation: Secondary | ICD-10-CM | POA: Diagnosis not present

## 2018-05-04 DIAGNOSIS — Z6821 Body mass index (BMI) 21.0-21.9, adult: Secondary | ICD-10-CM | POA: Diagnosis not present

## 2018-05-15 DIAGNOSIS — R202 Paresthesia of skin: Secondary | ICD-10-CM | POA: Diagnosis not present

## 2018-05-15 DIAGNOSIS — E1151 Type 2 diabetes mellitus with diabetic peripheral angiopathy without gangrene: Secondary | ICD-10-CM | POA: Diagnosis not present

## 2018-05-15 DIAGNOSIS — I7389 Other specified peripheral vascular diseases: Secondary | ICD-10-CM | POA: Diagnosis not present

## 2018-05-15 DIAGNOSIS — I4891 Unspecified atrial fibrillation: Secondary | ICD-10-CM | POA: Diagnosis not present

## 2018-05-15 DIAGNOSIS — Z6821 Body mass index (BMI) 21.0-21.9, adult: Secondary | ICD-10-CM | POA: Diagnosis not present

## 2018-06-03 DIAGNOSIS — Z7901 Long term (current) use of anticoagulants: Secondary | ICD-10-CM | POA: Diagnosis not present

## 2018-06-03 DIAGNOSIS — I4891 Unspecified atrial fibrillation: Secondary | ICD-10-CM | POA: Diagnosis not present

## 2018-06-09 ENCOUNTER — Encounter: Payer: Self-pay | Admitting: Podiatry

## 2018-06-09 ENCOUNTER — Ambulatory Visit (INDEPENDENT_AMBULATORY_CARE_PROVIDER_SITE_OTHER): Payer: Medicare Other | Admitting: Podiatry

## 2018-06-09 DIAGNOSIS — L84 Corns and callosities: Secondary | ICD-10-CM

## 2018-06-09 DIAGNOSIS — D689 Coagulation defect, unspecified: Secondary | ICD-10-CM | POA: Diagnosis not present

## 2018-06-09 NOTE — Progress Notes (Signed)
She presents today with her daughter chief concern of what appears to be a pre-ulcerative lesion to the distal aspect of the  hallux left foot.  Objective: Vital signs are stable she is alert and oriented x3 this is simply a callus on the distal aspect which is easy to remove and peel off there is absolutely nothing underlying it appears to be normal skin and tissue.  She does have a small vesicular lesions on her legs where she was bitten by ants and I am cleaned these areas today.  Instructed her to clean them daily with alcohol.  Assessment: Debrided pre-ulcerative lesion distal aspect of the toe.  Plan: Follow-up with me on as-needed basis.

## 2018-06-20 ENCOUNTER — Other Ambulatory Visit: Payer: Self-pay

## 2018-06-20 ENCOUNTER — Encounter (HOSPITAL_COMMUNITY): Payer: Self-pay | Admitting: Emergency Medicine

## 2018-06-20 ENCOUNTER — Ambulatory Visit (HOSPITAL_COMMUNITY)
Admission: EM | Admit: 2018-06-20 | Discharge: 2018-06-20 | Disposition: A | Discharge status: Home / Self Care | Payer: Medicare Other | Attending: Family Medicine | Admitting: Family Medicine

## 2018-06-20 DIAGNOSIS — Z96642 Presence of left artificial hip joint: Secondary | ICD-10-CM | POA: Diagnosis not present

## 2018-06-20 DIAGNOSIS — Z7901 Long term (current) use of anticoagulants: Secondary | ICD-10-CM | POA: Insufficient documentation

## 2018-06-20 DIAGNOSIS — R109 Unspecified abdominal pain: Secondary | ICD-10-CM | POA: Diagnosis not present

## 2018-06-20 DIAGNOSIS — Z882 Allergy status to sulfonamides status: Secondary | ICD-10-CM | POA: Diagnosis not present

## 2018-06-20 DIAGNOSIS — D649 Anemia, unspecified: Secondary | ICD-10-CM | POA: Diagnosis not present

## 2018-06-20 DIAGNOSIS — R1013 Epigastric pain: Secondary | ICD-10-CM | POA: Insufficient documentation

## 2018-06-20 DIAGNOSIS — R1110 Vomiting, unspecified: Secondary | ICD-10-CM | POA: Diagnosis not present

## 2018-06-20 DIAGNOSIS — Z888 Allergy status to other drugs, medicaments and biological substances status: Secondary | ICD-10-CM | POA: Insufficient documentation

## 2018-06-20 DIAGNOSIS — I10 Essential (primary) hypertension: Secondary | ICD-10-CM | POA: Insufficient documentation

## 2018-06-20 DIAGNOSIS — K219 Gastro-esophageal reflux disease without esophagitis: Secondary | ICD-10-CM | POA: Diagnosis not present

## 2018-06-20 DIAGNOSIS — Z881 Allergy status to other antibiotic agents status: Secondary | ICD-10-CM | POA: Insufficient documentation

## 2018-06-20 DIAGNOSIS — Z79899 Other long term (current) drug therapy: Secondary | ICD-10-CM | POA: Diagnosis not present

## 2018-06-20 DIAGNOSIS — Z7982 Long term (current) use of aspirin: Secondary | ICD-10-CM | POA: Insufficient documentation

## 2018-06-20 DIAGNOSIS — Z96653 Presence of artificial knee joint, bilateral: Secondary | ICD-10-CM | POA: Insufficient documentation

## 2018-06-20 DIAGNOSIS — E1165 Type 2 diabetes mellitus with hyperglycemia: Secondary | ICD-10-CM

## 2018-06-20 DIAGNOSIS — I4891 Unspecified atrial fibrillation: Secondary | ICD-10-CM | POA: Diagnosis not present

## 2018-06-20 LAB — COMPREHENSIVE METABOLIC PANEL
ALBUMIN: 4.1 g/dL (ref 3.5–5.0)
ALK PHOS: 141 U/L — AB (ref 38–126)
ALT: 223 U/L — ABNORMAL HIGH (ref 0–44)
AST: 176 U/L — AB (ref 15–41)
Anion gap: 16 — ABNORMAL HIGH (ref 5–15)
BILIRUBIN TOTAL: 11 mg/dL — AB (ref 0.3–1.2)
BUN: 24 mg/dL — AB (ref 8–23)
CALCIUM: 9.2 mg/dL (ref 8.9–10.3)
CO2: 22 mmol/L (ref 22–32)
Chloride: 96 mmol/L — ABNORMAL LOW (ref 98–111)
Creatinine, Ser: 1.38 mg/dL — ABNORMAL HIGH (ref 0.44–1.00)
GFR calc Af Amer: 39 mL/min — ABNORMAL LOW (ref >60)
GFR, EST NON AFRICAN AMERICAN: 33 mL/min — AB (ref >60)
GLUCOSE: 200 mg/dL — AB (ref 70–99)
Potassium: 3.4 mmol/L — ABNORMAL LOW (ref 3.5–5.1)
Sodium: 134 mmol/L — ABNORMAL LOW (ref 135–145)
TOTAL PROTEIN: 7.7 g/dL (ref 6.5–8.1)

## 2018-06-20 LAB — POCT URINALYSIS DIP (DEVICE)
GLUCOSE, UA: 100 mg/dL — AB
Nitrite: NEGATIVE
PH: 5.5 (ref 5.0–8.0)
PROTEIN: 100 mg/dL — AB
Specific Gravity, Urine: >=1.03 (ref 1.005–1.030)
UROBILINOGEN UA: 2 mg/dL — AB (ref 0.0–1.0)

## 2018-06-20 LAB — CBC WITH DIFFERENTIAL/PLATELET
Abs Immature Granulocytes: 0.1 10*3/uL (ref 0.0–0.1)
Basophils Absolute: 0.1 10*3/uL (ref 0.0–0.1)
Basophils Relative: 1 %
EOS ABS: 0 10*3/uL (ref 0.0–0.7)
EOS PCT: 0 %
HEMATOCRIT: 56.8 % — AB (ref 36.0–46.0)
HEMOGLOBIN: 18.8 g/dL — AB (ref 12.0–15.0)
IMMATURE GRANULOCYTES: 0 %
LYMPHS PCT: 2 %
Lymphs Abs: 0.4 10*3/uL — ABNORMAL LOW (ref 0.7–4.0)
MCH: 31.8 pg (ref 26.0–34.0)
MCHC: 33.1 g/dL (ref 30.0–36.0)
MCV: 96.1 fL (ref 78.0–100.0)
MONOS PCT: 7 %
Monocytes Absolute: 1.1 10*3/uL — ABNORMAL HIGH (ref 0.1–1.0)
Neutro Abs: 15.1 10*3/uL — ABNORMAL HIGH (ref 1.7–7.7)
Neutrophils Relative %: 90 %
Platelets: 187 10*3/uL (ref 150–400)
RBC: 5.91 MIL/uL — ABNORMAL HIGH (ref 3.87–5.11)
RDW: 13.1 % (ref 11.5–15.5)
WBC: 16.8 10*3/uL — ABNORMAL HIGH (ref 4.0–10.5)

## 2018-06-20 LAB — GLUCOSE, CAPILLARY: GLUCOSE-CAPILLARY: 220 mg/dL — AB (ref 70–99)

## 2018-06-20 MED ORDER — METFORMIN HCL 1000 MG PO TABS
500.0000 mg | ORAL_TABLET | Freq: Two times a day (BID) | ORAL | 0 refills | Status: DC
Start: 2018-06-20 — End: 2019-06-26

## 2018-06-20 NOTE — ED Notes (Signed)
Attempted to collect a urine specimen.  Patient unable to void.  Notified brittany, np

## 2018-06-20 NOTE — Discharge Instructions (Addendum)
Non-fasting blood glucose of 220  Urine showed glucose, protein, and WBCs in your urine We will follow up with you regarding the results of your additional blood test Will start on metformin Please follow up with PCP this week for further evaluation and management of symptoms Return or go to the ED if you have any new or worsening symptoms.

## 2018-06-20 NOTE — ED Triage Notes (Signed)
Abdominal pain, nausea and vomiting started Thursday night.  No vomiting today yesterday or today.  Patient stomach is still hurting, patient is not as sharp as family is used to, lethargic.

## 2018-06-20 NOTE — ED Provider Notes (Addendum)
[facility]   [id number] 06/20/18 Arrival Time: 1207  SUBJECTIVE:  [name] is a 84 y.o. female hx significant for A fib, HTN, GERD, and anemia who presents with complaint of abdominal discomfort that began abruptly 2 days ago.  It began after eating a milkshake and a fish sandwich from [place].  Reports nonbilious vomiting x2 episodes.  Now resolved.  Localizes pain to lower abdomen.  Describes as intermittent, stable, and throbbing in character.  Has tried OTC medications like tylenol with temporary relief.  Denies aggravating factors.  Denies association with eating.  Denies similar symptoms in the past.  Last BM yesterday and normal for patient.  Complains of decreased appetite.    Denies fever, chills, appetite changes, weight changes, nausea, vomiting, chest pain, diarrhea, constipation, hematochezia, melena, dysuria, difficulty urinating, increased frequency or urgency, flank pain, loss of bowel or bladder function.  No LMP recorded. Patient is postmenopausal.  ROS: As per HPI.  Past Medical History:  Diagnosis Date  . Anemia   . Arthritis   . Dysrhythmia    hx brief AF post op hip surg  . GERD (gastroesophageal reflux disease)   . Hypertension    Past Surgical History:  Procedure Laterality Date  . AMPUTATION Right 02/04/2013   Procedure: [name] ;  Surgeon: Wylene Simmer, MD;  Location: [facility];  Service: Orthopedics;  Laterality: Right;  . APPENDECTOMY    . COLONOSCOPY    . EYE SURGERY     both cataracts  . FEMORAL ARTERY EXPLORATION Right 04/12/2017   Procedure: EVACUATION HEMATOMA RIGHT GROIN WITH FEMORAL ARTERY REPAIR;  Surgeon: Rosetta Posner, MD;  Location: [facility];  Service: Vascular;  Laterality: Right;  . INCISION AND DRAINAGE  2011   left hip inf-hemovac  . IR ANGIOGRAM EXTREMITY LEFT  03/25/2017  . IR ANGIOGRAM EXTREMITY LEFT  04/08/2017  . IR ANGIOGRAM SELECTIVE EACH ADDITIONAL VESSEL  03/25/2017  . IR FEM POP ART  STENT INC PTA MOD SED  03/25/2017  . IR INFUSION THROMBOL ARTERIAL INITIAL (MS)  04/08/2017  . IR RADIOLOGIST EVAL & MGMT  02/26/2017  . IR RADIOLOGIST EVAL & MGMT  04/03/2017  . IR RADIOLOGIST EVAL & MGMT  08/20/2017  . IR RADIOLOGIST EVAL & MGMT  01/22/2018  . IR THROMB F/U EVAL ART/VEN FINAL DAY (MS)  04/09/2017  . IR TIB-PERO ART PTA MOD SED  03/25/2017  . IR US GUIDE VASC ACCESS RIGHT  03/25/2017  . IR US GUIDE VASC ACCESS RIGHT  04/08/2017  . KNEE ARTHROSCOPY  1995   left  . NECK MASS EXCISION    . TONSILLECTOMY    . TOTAL HIP ARTHROPLASTY  2006   left-fx  . TOTAL KNEE ARTHROPLASTY  [id number],4132   left  . TOTAL KNEE ARTHROPLASTY  2009   right   Allergies  Allergen Reactions  . Amprenavir Other (See Comments)    LISTED ON PATIENT'S MAR  . Bactrim [Sulfamethoxazole-Trimethoprim] Nausea Only  . Ceftriaxone Sodium Other (See Comments)    Tingly feeling after given, then one hour later while vanco infusing pruritic rash  . Phenergan [Promethazine Hcl] Other (See Comments)    Hallucinations  . Sulfonamide Derivatives Hives  . Vancomycin Hcl Rash    Rash happened 1hr after rocephin but immediately after infusing vancomycin   No current facility-administered medications on file prior to encounter.    Current Outpatient Medications on File Prior to Encounter  Medication Sig Dispense Refill  . acetaminophen (  TYLENOL) 325 MG tablet Take 650 mg by mouth daily.     Marland Kitchen aspirin EC 325 MG tablet Take 325 mg by mouth at bedtime.    . calcium carbonate (OSCAL) 1500 (600 Ca) MG TABS tablet Take 1 tablet by mouth daily with breakfast.     . docusate sodium (COLACE) 100 MG capsule Take 1 capsule (100 mg total) by mouth daily. 10 capsule 0  . ferrous sulfate 325 (65 FE) MG tablet Take 325 mg by mouth daily with breakfast.    . gentamicin cream (GARAMYCIN) 0.1 % Apply 1 application topically 3 (three) times daily. 30 g 1  . guaiFENesin-dextromethorphan (ROBITUSSIN DM) 100-10 MG/5ML syrup Take 15 mLs by  mouth every 4 (four) hours as needed for cough. (Patient not taking: Reported on 04/08/2018) 118 mL 0  . magnesium hydroxide (MILK OF MAGNESIA) 400 MG/5ML suspension Take 45 mLs by mouth See admin instructions. AS NEEDED FOR 2 DAYS TO STOP ON 04/13/17    . metoprolol succinate (TOPROL-XL) 25 MG 24 hr tablet Take 25 mg by mouth at bedtime.     . Multiple Vitamin (MULTIVITAMIN) tablet Take 1 tablet by mouth daily.    . mupirocin ointment (BACTROBAN) 2 % APPLY TO AFFECTED AREA BID  0  . pantoprazole (PROTONIX) 40 MG tablet Take 1 tablet (40 mg total) by mouth daily. 30 tablet 1  . vitamin B-12 (CYANOCOBALAMIN) 1000 MCG tablet Take 1,000 mcg by mouth daily.    . Vitamin D, Ergocalciferol, (DRISDOL) 50000 units CAPS capsule Take 50,000 Units by mouth every 7 (seven) days. Sundays    . warfarin (COUMADIN) 3 MG tablet Take 1 tablet (3 mg total) by mouth daily at 6 PM. FOR A-FIB 30 tablet 1   Social History   Socioeconomic History  . Marital status: Married    Spouse name: Not on file  . Number of children: Not on file  . Years of education: Not on file  . Highest education level: Not on file  Occupational History  . Not on file  Social Needs  . Financial resource strain: Not on file  . Food insecurity:    Worry: Not on file    Inability: Not on file  . Transportation needs:    Medical: Not on file    Non-medical: Not on file  Tobacco Use  . Smoking status: Never Smoker  . Smokeless tobacco: Never Used  Substance and Sexual Activity  . Alcohol use: No  . Drug use: No  . Sexual activity: Not on file  Lifestyle  . Physical activity:    Days per week: Not on file    Minutes per session: Not on file  . Stress: Not on file  Relationships  . Social connections:    Talks on phone: Not on file    Gets together: Not on file    Attends religious service: Not on file    Active member of club or organization: Not on file    Attends meetings of clubs or organizations: Not on file     Relationship status: Not on file  . Intimate partner violence:    Fear of current or ex partner: Not on file    Emotionally abused: Not on file    Physically abused: Not on file    Forced sexual activity: Not on file  Other Topics Concern  . Not on file  Social History Narrative  . Not on file   Family History  Problem Relation Age of Onset  .  Cancer Mother      OBJECTIVE:  Vitals:   06/20/18 1340  BP: (!) 173/94  Pulse: 94  Resp: 16  Temp: 98.5 F (36.9 C)  TempSrc: Oral  SpO2: 99%    General appearance: AOx2; appears frail; nontoxic appearance HEENT: NCAT.  Oropharynx clear.  Lungs: clear to auscultation bilaterally without adventitious breath sounds Heart: regularly irregular.  Radial pulses 2+ symmetrical bilaterally Abdomen: soft, non-distended; normal active bowel sounds; mildly tender to palpation about the epigastric region with mild diffuse tenderness about the RUQ; nontender at McBurney's point; negative rebound; no guarding Extremities: no edema; symmetrical with no gross deformities Skin: warm and dry; tenting of dorsal aspect of hand Neurologic: sitting in wheelchair Psychological: alert and cooperative; normal mood and affect; answers questions appropriately  Labs: Results for orders placed or performed during the hospital encounter of 06/20/18 (from the past 24 hour(s))  CBC with Differential     Status: Abnormal   Collection Time: 06/20/18  2:36 PM  Result Value Ref Range   WBC 16.8 (H) 4.0 - 10.5 K/uL   RBC 5.91 (H) 3.87 - 5.11 MIL/uL   Hemoglobin 18.8 (H) 12.0 - 15.0 g/dL   HCT 56.8 (H) 36.0 - 46.0 %   MCV 96.1 78.0 - 100.0 fL   MCH 31.8 26.0 - 34.0 pg   MCHC 33.1 30.0 - 36.0 g/dL   RDW 13.1 11.5 - 15.5 %   Platelets 187 150 - 400 K/uL   Neutrophils Relative % 90 %   Neutro Abs 15.1 (H) 1.7 - 7.7 K/uL   Lymphocytes Relative 2 %   Lymphs Abs 0.4 (L) 0.7 - 4.0 K/uL   Monocytes Relative 7 %   Monocytes Absolute 1.1 (H) 0.1 - 1.0 K/uL    Eosinophils Relative 0 %   Eosinophils Absolute 0.0 0.0 - 0.7 K/uL   Basophils Relative 1 %   Basophils Absolute 0.1 0.0 - 0.1 K/uL   Immature Granulocytes 0 %   Abs Immature Granulocytes 0.1 0.0 - 0.1 K/uL  Comprehensive metabolic panel     Status: Abnormal   Collection Time: 06/20/18  2:36 PM  Result Value Ref Range   Sodium 134 (L) 135 - 145 mmol/L   Potassium 3.4 (L) 3.5 - 5.1 mmol/L   Chloride 96 (L) 98 - 111 mmol/L   CO2 22 22 - 32 mmol/L   Glucose, Bld 200 (H) 70 - 99 mg/dL   BUN 24 (H) 8 - 23 mg/dL   Creatinine, Ser 1.38 (H) 0.44 - 1.00 mg/dL   Calcium 9.2 8.9 - 10.3 mg/dL   Total Protein 7.7 6.5 - 8.1 g/dL   Albumin 4.1 3.5 - 5.0 g/dL   AST 176 (H) 15 - 41 U/L   ALT 223 (H) 0 - 44 U/L   Alkaline Phosphatase 141 (H) 38 - 126 U/L   Total Bilirubin 11.0 (H) 0.3 - 1.2 mg/dL   GFR calc non Af Amer 33 (L) >60 mL/min   GFR calc Af Amer 39 (L) >60 mL/min   Anion gap 16 (H) 5 - 15  POCT urinalysis dip (device)     Status: Abnormal   Collection Time: 06/20/18  2:41 PM  Result Value Ref Range   Glucose, UA 100 (A) NEGATIVE mg/dL   Bilirubin Urine LARGE (A) NEGATIVE   Ketones, ur TRACE (A) NEGATIVE mg/dL   Specific Gravity, Urine >=1.030 1.005 - 1.030   Hgb urine dipstick MODERATE (A) NEGATIVE   pH 5.5 5.0 - 8.0  Protein, ur 100 (A) NEGATIVE mg/dL   Urobilinogen, UA 2.0 (H) 0.0 - 1.0 mg/dL   Nitrite NEGATIVE NEGATIVE   Leukocytes, UA LARGE (A) NEGATIVE  Glucose, capillary     Status: Abnormal   Collection Time: 06/20/18  3:11 PM  Result Value Ref Range   Glucose-Capillary 220 (H) 70 - 99 mg/dL    ASSESSMENT & PLAN:  1. Type 2 diabetes mellitus with hyperglycemia, without long-term current use of insulin (HCC)   2. Abdominal discomfort    Discussed patient case with Dr. Mannie Stabile.  Patient appears stable and nontoxic appearance.  Afebrile and not hypotensive.  PE with mild diffuse epigastric pain.  Urine showed glucose and WBC with moderate blood.  Glucose elevated in  office.  Patient started on metformin and instructed to follow up with PCP this week for reevaluation.  Given strict return and ER precautions.    After discharge, I received blood work results and discussed options with Dr. Mannie Stabile.  WBC elevated with left shift, which suggests infectious process. Abnormal LFT also.  Patient stable in office, afebrile and not hypotensive.  Discussed options with daughter-in-law.  Would like to try to avoid ED and try outpatient therapy under close watch of family members.  Recommended strict follow up with PCP this week, and given strict return and ED precautions. Patient and daughter-in-law are in agreement with this plan.    Meds ordered this encounter  Medications  . metFORMIN (GLUCOPHAGE) 1000 MG tablet    Sig: Take 0.5 tablets (500 mg total) by mouth 2 (two) times daily.    Dispense:  30 tablet    Refill:  0    Order Specific Question:   Supervising Provider    Answer:   [name] [[id number]]    Non-fasting blood glucose of 220  Urine showed glucose, protein, and WBCs in your urine We will follow up with you regarding the results of your additional blood test.   Will start on metformin Please follow up with PCP this week for further evaluation and management of symptoms Return or go to the ED if you have any new or worsening symptoms.   Reviewed expectations re: course of current medical issues. Questions answered. Outlined signs and symptoms indicating need for more acute intervention. Patient verbalized understanding. After Visit Summary given.       Lestine Box, PA-C 06/20/18 2012

## 2018-06-20 NOTE — ED Notes (Signed)
Patient is unable to void at this time 

## 2018-06-22 DIAGNOSIS — E1151 Type 2 diabetes mellitus with diabetic peripheral angiopathy without gangrene: Secondary | ICD-10-CM | POA: Diagnosis not present

## 2018-06-22 DIAGNOSIS — Z6820 Body mass index (BMI) 20.0-20.9, adult: Secondary | ICD-10-CM | POA: Diagnosis not present

## 2018-06-22 DIAGNOSIS — R17 Unspecified jaundice: Secondary | ICD-10-CM | POA: Diagnosis not present

## 2018-06-25 ENCOUNTER — Other Ambulatory Visit: Payer: Self-pay | Admitting: Internal Medicine

## 2018-06-25 DIAGNOSIS — R17 Unspecified jaundice: Secondary | ICD-10-CM

## 2018-06-29 DIAGNOSIS — R200 Anesthesia of skin: Secondary | ICD-10-CM | POA: Diagnosis not present

## 2018-06-29 DIAGNOSIS — M79641 Pain in right hand: Secondary | ICD-10-CM | POA: Diagnosis not present

## 2018-06-29 DIAGNOSIS — M79642 Pain in left hand: Secondary | ICD-10-CM | POA: Diagnosis not present

## 2018-07-01 DIAGNOSIS — I480 Paroxysmal atrial fibrillation: Secondary | ICD-10-CM | POA: Diagnosis not present

## 2018-07-01 DIAGNOSIS — E1151 Type 2 diabetes mellitus with diabetic peripheral angiopathy without gangrene: Secondary | ICD-10-CM | POA: Diagnosis not present

## 2018-07-01 DIAGNOSIS — I10 Essential (primary) hypertension: Secondary | ICD-10-CM | POA: Diagnosis not present

## 2018-07-01 DIAGNOSIS — Z7901 Long term (current) use of anticoagulants: Secondary | ICD-10-CM | POA: Diagnosis not present

## 2018-07-03 ENCOUNTER — Ambulatory Visit
Admission: RE | Admit: 2018-07-03 | Discharge: 2018-07-03 | Disposition: A | Discharge status: Home / Self Care | Payer: Medicare Other | Source: Ambulatory Visit | Attending: Internal Medicine | Admitting: Internal Medicine

## 2018-07-03 DIAGNOSIS — R17 Unspecified jaundice: Secondary | ICD-10-CM

## 2018-07-03 DIAGNOSIS — K8020 Calculus of gallbladder without cholecystitis without obstruction: Secondary | ICD-10-CM | POA: Diagnosis not present

## 2018-07-15 DIAGNOSIS — G5613 Other lesions of median nerve, bilateral upper limbs: Secondary | ICD-10-CM | POA: Diagnosis not present

## 2018-07-15 DIAGNOSIS — G5623 Lesion of ulnar nerve, bilateral upper limbs: Secondary | ICD-10-CM | POA: Diagnosis not present

## 2018-07-22 ENCOUNTER — Ambulatory Visit (INDEPENDENT_AMBULATORY_CARE_PROVIDER_SITE_OTHER): Payer: Medicare Other | Admitting: Podiatry

## 2018-07-22 DIAGNOSIS — L97519 Non-pressure chronic ulcer of other part of right foot with unspecified severity: Secondary | ICD-10-CM

## 2018-07-22 DIAGNOSIS — L97512 Non-pressure chronic ulcer of other part of right foot with fat layer exposed: Secondary | ICD-10-CM

## 2018-07-22 DIAGNOSIS — I83015 Varicose veins of right lower extremity with ulcer other part of foot: Secondary | ICD-10-CM

## 2018-07-22 MED ORDER — DOXYCYCLINE HYCLATE 100 MG PO TABS
100.0000 mg | ORAL_TABLET | Freq: Two times a day (BID) | ORAL | 0 refills | Status: DC
Start: 2018-07-22 — End: 2018-08-14

## 2018-07-27 DIAGNOSIS — G5603 Carpal tunnel syndrome, bilateral upper limbs: Secondary | ICD-10-CM | POA: Diagnosis not present

## 2018-07-27 NOTE — Progress Notes (Signed)
   Subjective:  84 year old female presenting today for follow up evaluation of an ulceration of the left foot. She states the left foot ulcer has improved significantly but she now has a new ulcer on the right third toe. She has not done anything for treatment and denies modifying factors. Patient is here for further evaluation and treatment.   Past Medical History:  Diagnosis Date  . Anemia   . Arthritis   . Dysrhythmia    hx brief AF post op hip surg  . GERD (gastroesophageal reflux disease)   . Hypertension      Objective/Physical Exam General: The patient is alert and oriented x3 in no acute distress.  Dermatology:  Wound #1 noted to the right 3rd toe measuring 0.5 x 0.5 x 0.3 cm (LxWxD).   To the noted ulceration(s), there is no eschar. There is a moderate amount of slough, fibrin, and necrotic tissue noted. Granulation tissue and wound base is red. There is a minimal amount of serosanguineous drainage noted. There is exposed bone noted. There is no malodor. Periwound integrity is intact. Skin is warm, dry and supple bilateral lower extremities.  Vascular: Palpable pedal pulses bilaterally. Mild edema noted. Capillary refill within normal limits. Varicosities noted bilateral lower extremities.   Neurological: Epicritic and protective threshold diminished bilaterally.   Musculoskeletal Exam: Range of motion within normal limits to all pedal and ankle joints bilateral. Muscle strength 5/5 in all groups bilateral.   Assessment: #1 ulceration of the right third toe secondary to venous insufficiency #2 varicosities bilateral lower extremities  Plan of Care:  #1 Patient was evaluated. #2 Medically necessary excisional debridement including muscle and deep fascial tissue was performed using a tissue nipper and a chisel blade. Excisional debridement of all the necrotic nonviable tissue down to healthy bleeding viable tissue was performed with post-debridement measurements same as  pre-. #3 The wound was cleansed and dry sterile dressing applied. #4 Recommended Betadine daily with a dry sterile dressing.  #5 Resume wearing post op shoe.  #6 Prescription for Doxycycline provided to patient for prophylaxis for two weeks. #7 Return to clinic in 2 weeks.    [name], DPM [facility]  Dr. [name], [address]                                        [place], [place] [address]                Office [phone]  Fax [phone]

## 2018-07-29 ENCOUNTER — Ambulatory Visit: Payer: Medicare Other | Admitting: Podiatry

## 2018-08-05 ENCOUNTER — Ambulatory Visit (INDEPENDENT_AMBULATORY_CARE_PROVIDER_SITE_OTHER): Payer: Medicare Other | Admitting: Podiatry

## 2018-08-05 ENCOUNTER — Encounter: Payer: Self-pay | Admitting: Podiatry

## 2018-08-05 DIAGNOSIS — L97519 Non-pressure chronic ulcer of other part of right foot with unspecified severity: Secondary | ICD-10-CM

## 2018-08-05 DIAGNOSIS — I83015 Varicose veins of right lower extremity with ulcer other part of foot: Secondary | ICD-10-CM

## 2018-08-05 DIAGNOSIS — L97512 Non-pressure chronic ulcer of other part of right foot with fat layer exposed: Secondary | ICD-10-CM

## 2018-08-08 NOTE — Progress Notes (Signed)
   Subjective:  84 year old female presenting today for follow up evaluation of an ulceration of the left foot. She states she is doing well and denies any significant pain. She reports some associated bleeding but states it is minimal. She expresses concern about the interdigital space between the great toe and second toe and believes she is developing another lesion. She has been applying Iodine daily as directed. She denies modifying factors. Patient is here for further evaluation and treatment.   Past Medical History:  Diagnosis Date  . Anemia   . Arthritis   . Dysrhythmia    hx brief AF post op hip surg  . GERD (gastroesophageal reflux disease)   . Hypertension      Objective/Physical Exam General: The patient is alert and oriented x3 in no acute distress.  Dermatology:  Wound #1 noted to the right 3rd toe measuring 0.3 x 0.3 x 0.2 cm (LxWxD).   To the noted ulceration(s), there is no eschar. There is a moderate amount of slough, fibrin, and necrotic tissue noted. Granulation tissue and wound base is red. There is a minimal amount of serosanguineous drainage noted. There is exposed bone noted. There is no malodor. Periwound integrity is intact. Skin is warm, dry and supple bilateral lower extremities.  Vascular: Palpable pedal pulses bilaterally. Mild edema noted. Capillary refill within normal limits. Varicosities noted bilateral lower extremities.   Neurological: Epicritic and protective threshold diminished bilaterally.   Musculoskeletal Exam: Range of motion within normal limits to all pedal and ankle joints bilateral. Muscle strength 5/5 in all groups bilateral.   Assessment: #1 ulceration of the right third toe secondary to venous insufficiency #2 varicosities bilateral lower extremities  Plan of Care:  #1 Patient was evaluated. #2 Medically necessary excisional debridement including subcutaneous tissue was performed using a tissue nipper and a chisel blade. Excisional  debridement of all the necrotic nonviable tissue down to healthy bleeding viable tissue was performed with post-debridement measurements same as pre-. #3 The wound was cleansed and dry sterile dressing applied. #4 Continue using Iodine daily with a dry sterile dressing.  #5 Continue weightbearing in post op shoe.  #6 Return to clinic in 2 weeks.    [name], DPM [facility]  Dr. [name], [address]                                        [place], [place] [address]                Office [phone]  Fax [phone]

## 2018-08-12 DIAGNOSIS — I4891 Unspecified atrial fibrillation: Secondary | ICD-10-CM | POA: Diagnosis not present

## 2018-08-12 DIAGNOSIS — Z7901 Long term (current) use of anticoagulants: Secondary | ICD-10-CM | POA: Diagnosis not present

## 2018-08-12 DIAGNOSIS — Z23 Encounter for immunization: Secondary | ICD-10-CM | POA: Diagnosis not present

## 2018-08-12 DIAGNOSIS — E1151 Type 2 diabetes mellitus with diabetic peripheral angiopathy without gangrene: Secondary | ICD-10-CM | POA: Diagnosis not present

## 2018-08-14 ENCOUNTER — Encounter: Payer: Self-pay | Admitting: Podiatry

## 2018-08-14 ENCOUNTER — Ambulatory Visit (INDEPENDENT_AMBULATORY_CARE_PROVIDER_SITE_OTHER): Payer: Medicare Other | Admitting: Podiatry

## 2018-08-14 DIAGNOSIS — L97512 Non-pressure chronic ulcer of other part of right foot with fat layer exposed: Secondary | ICD-10-CM | POA: Diagnosis not present

## 2018-08-14 DIAGNOSIS — I83015 Varicose veins of right lower extremity with ulcer other part of foot: Secondary | ICD-10-CM

## 2018-08-14 DIAGNOSIS — M86171 Other acute osteomyelitis, right ankle and foot: Secondary | ICD-10-CM | POA: Diagnosis not present

## 2018-08-14 DIAGNOSIS — L97519 Non-pressure chronic ulcer of other part of right foot with unspecified severity: Secondary | ICD-10-CM

## 2018-08-14 MED ORDER — DOXYCYCLINE HYCLATE 100 MG PO TABS: 100.0000 mg | ORAL_TABLET | Freq: Two times a day (BID) | ORAL | 0 refills | Status: DC

## 2018-08-14 NOTE — Patient Instructions (Signed)
Pre-Operative Instructions  Congratulations, you have decided to take an important step towards improving your quality of life.  You can be assured that the doctors and staff at [facility] will be with you every step of the way.  Here are some important things you should know:  1. Plan to be at the surgery center/hospital at least 1 (one) hour prior to your scheduled time, unless otherwise directed by the surgical center/hospital staff.  You must have a responsible adult accompany you, remain during the surgery and drive you home.  Make sure you have directions to the surgical center/hospital to ensure you arrive on time. 2. If you are having surgery at [facility] or [facility], you will need a copy of your medical history and physical form from your family physician within one month prior to the date of surgery. We will give you a form for your primary physician to complete.  3. We make every effort to accommodate the date you request for surgery.  However, there are times where surgery dates or times have to be moved.  We will contact you as soon as possible if a change in schedule is required.   4. No aspirin/ibuprofen for one week before surgery.  If you are on aspirin, any non-steroidal anti-inflammatory medications (Mobic, Aleve, Ibuprofen) should not be taken seven (7) days prior to your surgery.  You make take Tylenol for pain prior to surgery.  5. Medications - If you are taking daily heart and blood pressure medications, seizure, reflux, allergy, asthma, anxiety, pain or diabetes medications, make sure you notify the surgery center/hospital before the day of surgery so they can tell you which medications you should take or avoid the day of surgery. 6. No food or drink after midnight the night before surgery unless directed otherwise by surgical center/hospital staff. 7. No alcoholic beverages 24-hours prior to surgery.  No smoking 24-hours prior or 24-hours after  surgery. 8. Wear loose pants or shorts. They should be loose enough to fit over bandages, boots, and casts. 9. Don't wear slip-on shoes. Sneakers are preferred. 10. Bring your boot with you to the surgery center/hospital.  Also bring crutches or a walker if your physician has prescribed it for you.  If you do not have this equipment, it will be provided for you after surgery. 11. If you have not been contacted by the surgery center/hospital by the day before your surgery, call to confirm the date and time of your surgery. 12. Leave-time from work may vary depending on the type of surgery you have.  Appropriate arrangements should be made prior to surgery with your employer. 13. Prescriptions will be provided immediately following surgery by your doctor.  Fill these as soon as possible after surgery and take the medication as directed. Pain medications will not be refilled on weekends and must be approved by the doctor. 14. Remove nail polish on the operative foot and avoid getting pedicures prior to surgery. 15. Wash the night before surgery.  The night before surgery wash the foot and leg well with water and the antibacterial soap provided. Be sure to pay special attention to beneath the toenails and in between the toes.  Wash for at least three (3) minutes. Rinse thoroughly with water and dry well with a towel.  Perform this wash unless told not to do so by your physician.  Enclosed: 1 Ice pack (please put in freezer the night before surgery)   1 Hibiclens skin cleaner     Pre-op instructions  If you have any questions regarding the instructions, please do not hesitate to call our office.  [place]: [address], [place], [place] [address] -- [phone]  [place]: [address] , [place] [address] -- [phone]  [place]: [address]  [place], [place] [address] -- [phone]  [place]: [address], [address], [place], [place] [address] -- [phone]  Website:  [email or web address] 

## 2018-08-16 NOTE — H&P (View-Only) (Signed)
Subjective:  84 year old female presenting today for follow up evaluation of an ulceration of the left foot.  Patient continues to deny pain.  The patient and her daughter are concerned for infection to the third digit of the right foot.  They have been applying Iodosorb.  There is an increased amount of redness around the toe.  This started yesterday afternoon.  Patient denies fever and chills.  She has been weightbearing in the surgical postoperative shoe.  Past Medical History:  Diagnosis Date  . Anemia   . Arthritis   . Dysrhythmia    hx brief AF post op hip surg  . GERD (gastroesophageal reflux disease)   . Hypertension      Objective/Physical Exam General: The patient is alert and oriented x3 in no acute distress.  Dermatology:  Wound #1 noted to the right 3rd toe measuring 0.3 x 0.3 x 0.5 cm (LxWxD).   To the noted ulceration(s), there is no eschar. There is a moderate amount of slough, fibrin, and necrotic tissue noted. Granulation tissue and wound base is red. There is a minimal amount of serosanguineous drainage noted. There is exposed bone noted. There is no malodor. Periwound integrity is intact. Skin is warm, dry and supple bilateral lower extremities.  Today upon debridement there was a significant amount of bone coming out of the ulceration site, consistent with osteomyelitis  Vascular: Palpable pedal pulses bilaterally. Mild edema noted. Capillary refill within normal limits. Varicosities noted bilateral lower extremities.   Neurological: Epicritic and protective threshold diminished bilaterally.   Musculoskeletal Exam: Range of motion within normal limits to all pedal and ankle joints bilateral. Muscle strength 5/5 in all groups bilateral.   Assessment: #1 ulceration of the right third toe secondary to venous insufficiency #2 varicosities bilateral lower extremities #3 osteomyelitis distal aspect of the third toe right foot  Plan of Care:  #1 Patient was  evaluated. #2 Medically necessary excisional debridement including muscle and deep fascial tissue was performed using a tissue nipper and a chisel blade. Excisional debridement of all the necrotic nonviable tissue down to healthy bleeding viable tissue was performed with post-debridement measurements same as pre-. #3 The wound was cleansed and dry sterile dressing applied. #4 Continue using Iodine daily with a dry sterile dressing.  #5 Continue weightbearing in post op shoe.  #6  Today I explained to the patient and her daughter that it would be in her best interest to perform a partial toe amputation to the third digit right foot due to the exposed bone and likely osteomyelitis. Today we discussed the conservative versus surgical management of the presenting pathology. The patient opts for surgical management. All possible complications and details of the procedure were explained. All patient questions were answered. No guarantees were expressed or implied. 3. Authorization for surgery was initiated today. Surgery will consist of partial toe amputation third digit right foot 4.  New wound cultures were taken today and sent the pathology for culture and sensitivity 5.  Prescription for doxycycline 100 mg 2 times daily 6.  Return to clinic 1 week postop     [name], DPM [facility]  Dr. [name], [address]                                        [place],  [address]  Office [phone]  Fax [phone]

## 2018-08-16 NOTE — Progress Notes (Signed)
Subjective:  84 year old female presenting today for follow up evaluation of an ulceration of the left foot.  Patient continues to deny pain.  The patient and her daughter are concerned for infection to the third digit of the right foot.  They have been applying Iodosorb.  There is an increased amount of redness around the toe.  This started yesterday afternoon.  Patient denies fever and chills.  She has been weightbearing in the surgical postoperative shoe.  Past Medical History:  Diagnosis Date  . Anemia   . Arthritis   . Dysrhythmia    hx brief AF post op hip surg  . GERD (gastroesophageal reflux disease)   . Hypertension      Objective/Physical Exam General: The patient is alert and oriented x3 in no acute distress.  Dermatology:  Wound #1 noted to the right 3rd toe measuring 0.3 x 0.3 x 0.5 cm (LxWxD).   To the noted ulceration(s), there is no eschar. There is a moderate amount of slough, fibrin, and necrotic tissue noted. Granulation tissue and wound base is red. There is a minimal amount of serosanguineous drainage noted. There is exposed bone noted. There is no malodor. Periwound integrity is intact. Skin is warm, dry and supple bilateral lower extremities.  Today upon debridement there was a significant amount of bone coming out of the ulceration site, consistent with osteomyelitis  Vascular: Palpable pedal pulses bilaterally. Mild edema noted. Capillary refill within normal limits. Varicosities noted bilateral lower extremities.   Neurological: Epicritic and protective threshold diminished bilaterally.   Musculoskeletal Exam: Range of motion within normal limits to all pedal and ankle joints bilateral. Muscle strength 5/5 in all groups bilateral.   Assessment: #1 ulceration of the right third toe secondary to venous insufficiency #2 varicosities bilateral lower extremities #3 osteomyelitis distal aspect of the third toe right foot  Plan of Care:  #1 Patient was  evaluated. #2 Medically necessary excisional debridement including muscle and deep fascial tissue was performed using a tissue nipper and a chisel blade. Excisional debridement of all the necrotic nonviable tissue down to healthy bleeding viable tissue was performed with post-debridement measurements same as pre-. #3 The wound was cleansed and dry sterile dressing applied. #4 Continue using Iodine daily with a dry sterile dressing.  #5 Continue weightbearing in post op shoe.  #6  Today I explained to the patient and her daughter that it would be in her best interest to perform a partial toe amputation to the third digit right foot due to the exposed bone and likely osteomyelitis. Today we discussed the conservative versus surgical management of the presenting pathology. The patient opts for surgical management. All possible complications and details of the procedure were explained. All patient questions were answered. No guarantees were expressed or implied. 3. Authorization for surgery was initiated today. Surgery will consist of partial toe amputation third digit right foot 4.  New wound cultures were taken today and sent the pathology for culture and sensitivity 5.  Prescription for doxycycline 100 mg 2 times daily 6.  Return to clinic 1 week postop     [name], DPM [facility]  Dr. [name], [address]                                        [place], [place] [address]  Office [phone]  Fax [phone]

## 2018-08-17 LAB — WOUND CULTURE: Organism ID, Bacteria: NONE SEEN

## 2018-08-18 ENCOUNTER — Telehealth: Payer: Self-pay | Admitting: *Deleted

## 2018-08-18 NOTE — Telephone Encounter (Signed)
"  She needs a surgery with [name].  So I was just calling to set that appointment up.  If you could give me a call back at your convenience to set that surgery up.  I would appreciate it."

## 2018-08-19 ENCOUNTER — Ambulatory Visit: Payer: Medicare Other | Admitting: Podiatry

## 2018-08-19 NOTE — Telephone Encounter (Signed)
I am returning your call.  I wanted to inform you that Dr. Amalia [name] got [name]'s lab results back.  She has MRSA.  So, her surgery cannot be performed at [facility].  It will have to be done at [facility].  With it being done there, she needs to have a history and physical completed by her primary care physician.  The visit has to have be within a 30 day period of the surgery date.  Who is her primary care physician and has she seen him/her recently?  "Her primary care physician is [name].  She sees him once every quarter."  Hopefully Dr. Amalia [name] can do her surgery on October 17.  He does not have block time so we have to wait and see if anything becomes available.  In the meantime, she will need to schedule an appointment with her primary care doctor, if need be, so he/she can complete the forms.  Can you go by the [place] office to get the forms?  "Yes but I'd rather come by the [place] office to pick them up because I live closer to [facility], that is fine .  I'll leave them at the front desk.  (Alert set for [facility] for 09/03/18)

## 2018-08-24 ENCOUNTER — Telehealth: Payer: Self-pay | Admitting: Podiatry

## 2018-08-24 DIAGNOSIS — L97512 Non-pressure chronic ulcer of other part of right foot with fat layer exposed: Secondary | ICD-10-CM

## 2018-08-24 DIAGNOSIS — M86171 Other acute osteomyelitis, right ankle and foot: Secondary | ICD-10-CM

## 2018-08-24 DIAGNOSIS — L84 Corns and callosities: Secondary | ICD-10-CM

## 2018-08-24 DIAGNOSIS — Z9889 Other specified postprocedural states: Secondary | ICD-10-CM

## 2018-08-24 DIAGNOSIS — D689 Coagulation defect, unspecified: Secondary | ICD-10-CM

## 2018-08-24 DIAGNOSIS — I83015 Varicose veins of right lower extremity with ulcer other part of foot: Secondary | ICD-10-CM

## 2018-08-24 DIAGNOSIS — L97519 Non-pressure chronic ulcer of other part of right foot with unspecified severity: Secondary | ICD-10-CM

## 2018-08-24 MED ORDER — DOXYCYCLINE HYCLATE 100 MG PO TABS: 100.0000 mg | ORAL_TABLET | Freq: Two times a day (BID) | ORAL | 0 refills | Status: DC

## 2018-08-24 NOTE — Addendum Note (Signed)
Addended by: Harriett Sine D on: 08/24/2018 02:24 PM   Modules accepted: Orders

## 2018-08-24 NOTE — Addendum Note (Signed)
Addended by: Harriett Sine D on: 08/24/2018 04:50 PM   Modules accepted: Orders

## 2018-08-24 NOTE — Telephone Encounter (Signed)
Faxed required form, clinicals and demographics to Infectious Disease. 

## 2018-08-24 NOTE — Telephone Encounter (Signed)
My name is Naveen Lorusso and I'm calling for pt [name]. She has MRSA and is scheduled for surgery on 17 October. She has antibiotics that she has ran out of or is about to run out of. She is taking doxycycline and I was wondering if she could get a refill? She is also complaining about her toe and I didn't know if [name] would like to see her again prior to surgery. Please call me back at [phone].

## 2018-08-24 NOTE — Telephone Encounter (Addendum)
I called [name] states the toe is getting more drainage, red and hot. I told [name] I would be able to get pt in to see the nurse and then [name] on Wednesday. I transferred to schedulers. I decided pt should be seen on Tuesday on the Nurses schedule and then report to [name] rather than wait.

## 2018-08-24 NOTE — Telephone Encounter (Signed)
Tiffany Arab, RN states pt is allergic to the antibiotics the staph is susceptible to, and Ddr. Amalia Hailey would like pt to get an urgent referral to Infectious Disease, can keep tomorrow's appt for evaluation and dressing change from on Nurses' schedule, and okay to refill the doxycycline. I informed [name] and she states understanding and gave Infectious Disease [phone].

## 2018-08-25 ENCOUNTER — Encounter: Payer: Self-pay | Admitting: Sports Medicine

## 2018-08-25 ENCOUNTER — Ambulatory Visit (INDEPENDENT_AMBULATORY_CARE_PROVIDER_SITE_OTHER): Payer: Medicare Other | Admitting: Sports Medicine

## 2018-08-25 ENCOUNTER — Ambulatory Visit (INDEPENDENT_AMBULATORY_CARE_PROVIDER_SITE_OTHER): Payer: Medicare Other

## 2018-08-25 DIAGNOSIS — L97512 Non-pressure chronic ulcer of other part of right foot with fat layer exposed: Secondary | ICD-10-CM | POA: Diagnosis not present

## 2018-08-25 DIAGNOSIS — M8610 Other acute osteomyelitis, unspecified site: Secondary | ICD-10-CM

## 2018-08-25 DIAGNOSIS — L97514 Non-pressure chronic ulcer of other part of right foot with necrosis of bone: Secondary | ICD-10-CM

## 2018-08-25 DIAGNOSIS — M86171 Other acute osteomyelitis, right ankle and foot: Secondary | ICD-10-CM

## 2018-08-25 DIAGNOSIS — L03031 Cellulitis of right toe: Secondary | ICD-10-CM

## 2018-08-25 DIAGNOSIS — L02611 Cutaneous abscess of right foot: Secondary | ICD-10-CM | POA: Diagnosis not present

## 2018-08-25 DIAGNOSIS — L0390 Cellulitis, unspecified: Secondary | ICD-10-CM

## 2018-08-25 HISTORY — DX: Non-pressure chronic ulcer of other part of right foot with necrosis of bone: L97.514

## 2018-08-25 HISTORY — DX: Cellulitis, unspecified: L03.90

## 2018-08-25 HISTORY — DX: Other acute osteomyelitis, unspecified site: M86.10

## 2018-08-25 MED ORDER — GENTAMICIN SULFATE 0.1 % EX CREA: 1.0000 "application " | TOPICAL_CREAM | Freq: Three times a day (TID) | CUTANEOUS | 0 refills | Status: DC

## 2018-08-25 NOTE — Progress Notes (Signed)
Subjective: [name] is a 84 y.o. female patient seen in office for evaluation of ulceration of the right third toe ulceration.  Patient is assisted by daughter who reports that they have noticed increased redness and swelling and became concerned at the lack of progress that the third toe is making.  Patient states that she is getting throbbing pain into the foot and ankle and wanted to have her foot checked.  Patient is currently taking doxycycline and has been referred to infectious disease however due to acute concern for the changes that are happening around the third toe decided to present to the office today.  Patient denies nausea, vomiting, fever, chills or any other constitutional symptoms at this time.  Patient Active Problem List   Diagnosis Date Noted  . Degeneration of lumbar intervertebral disc 12/11/2017  . Cough   . Hematoma   . Groin swelling 04/12/2017  . Hematoma of groin 04/12/2017  . Critical lower limb ischemia 04/08/2017  . Long term (current) use of anticoagulants [Z79.01] 12/13/2015  . Closed dislocation of tarsometatarsal joint 10/05/2015  . ULCER OF OTHER PART OF FOOT 06/06/2010  . ESSENTIAL HYPERTENSION 03/26/2010  . ATRIAL FIBRILLATION 03/26/2010  . CUTANEOUS ERUPTIONS, DRUG-INDUCED 03/26/2010  . DIARRHEA 03/26/2010  . GERD 03/20/2010  . UNSPEC LOCAL INFECTION SKIN&SUBCUTANEOUS TISSUE 03/20/2010  . OSTEOARTHRITIS 03/20/2010  . ARTHRITIS, SEPTIC 03/14/2010  . OTH COMPLICATIONS DUE INTERNAL JOINT PROSTHESIS 02/22/2010   Current Outpatient Medications on File Prior to Visit  Medication Sig Dispense Refill  . acetaminophen (TYLENOL) 325 MG tablet Take 650 mg by mouth daily.     Marland Kitchen aspirin EC 325 MG tablet Take 325 mg by mouth at bedtime.    . calcium carbonate (OSCAL) 1500 (600 Ca) MG TABS tablet Take 1 tablet by mouth daily with breakfast.     . docusate sodium (COLACE) 100 MG capsule Take 1 capsule (100 mg total) by mouth daily. 10 capsule 0  .  doxycycline (VIBRA-TABS) 100 MG tablet Take 1 tablet (100 mg total) by mouth 2 (two) times daily. 28 tablet 0  . ferrous sulfate 325 (65 FE) MG tablet Take 325 mg by mouth daily with breakfast.    . guaiFENesin-dextromethorphan (ROBITUSSIN DM) 100-10 MG/5ML syrup Take 15 mLs by mouth every 4 (four) hours as needed for cough. 118 mL 0  . magnesium hydroxide (MILK OF MAGNESIA) 400 MG/5ML suspension Take 45 mLs by mouth See admin instructions. AS NEEDED FOR 2 DAYS TO STOP ON 04/13/17    . metFORMIN (GLUCOPHAGE) 1000 MG tablet Take 0.5 tablets (500 mg total) by mouth 2 (two) times daily. 30 tablet 0  . metoprolol succinate (TOPROL-XL) 25 MG 24 hr tablet Take 25 mg by mouth at bedtime.     . Multiple Vitamin (MULTIVITAMIN) tablet Take 1 tablet by mouth daily.    . mupirocin ointment (BACTROBAN) 2 % APPLY TO AFFECTED AREA BID  0  . pantoprazole (PROTONIX) 40 MG tablet Take 1 tablet (40 mg total) by mouth daily. 30 tablet 1  . vitamin B-12 (CYANOCOBALAMIN) 1000 MCG tablet Take 1,000 mcg by mouth daily.    . Vitamin D, Ergocalciferol, (DRISDOL) 50000 units CAPS capsule Take 50,000 Units by mouth every 7 (seven) days. Sundays    . warfarin (COUMADIN) 3 MG tablet Take 1 tablet (3 mg total) by mouth daily at 6 PM. FOR A-FIB 30 tablet 1   No current facility-administered medications on file prior to visit.    Allergies  Allergen Reactions  .  Amprenavir Other (See Comments)    LISTED ON PATIENT'S MAR  . Bactrim [Sulfamethoxazole-Trimethoprim] Nausea Only  . Ceftriaxone Sodium Other (See Comments)    Tingly feeling after given, then one hour later while vanco infusing pruritic rash  . Phenergan [Promethazine Hcl] Other (See Comments)    Hallucinations  . Sulfonamide Derivatives Hives  . Vancomycin Hcl Rash    Rash happened 1hr after rocephin but immediately after infusing vancomycin    Recent Results (from the past 2160 hour(s))  CBC with Differential     Status: Abnormal   Collection Time: 06/20/18   2:36 PM  Result Value Ref Range   WBC 16.8 (H) 4.0 - 10.5 K/uL   RBC 5.91 (H) 3.87 - 5.11 MIL/uL   Hemoglobin 18.8 (H) 12.0 - 15.0 g/dL   HCT 56.8 (H) 36.0 - 46.0 %   MCV 96.1 78.0 - 100.0 fL   MCH 31.8 26.0 - 34.0 pg   MCHC 33.1 30.0 - 36.0 g/dL   RDW 13.1 11.5 - 15.5 %   Platelets 187 150 - 400 K/uL   Neutrophils Relative % 90 %   Neutro Abs 15.1 (H) 1.7 - 7.7 K/uL   Lymphocytes Relative 2 %   Lymphs Abs 0.4 (L) 0.7 - 4.0 K/uL   Monocytes Relative 7 %   Monocytes Absolute 1.1 (H) 0.1 - 1.0 K/uL   Eosinophils Relative 0 %   Eosinophils Absolute 0.0 0.0 - 0.7 K/uL   Basophils Relative 1 %   Basophils Absolute 0.1 0.0 - 0.1 K/uL   Immature Granulocytes 0 %   Abs Immature Granulocytes 0.1 0.0 - 0.1 K/uL    Comment: Performed at [facility], [address] [place], [place] [address]  Comprehensive metabolic panel     Status: Abnormal   Collection Time: 06/20/18  2:36 PM  Result Value Ref Range   Sodium 134 (L) 135 - 145 mmol/L   Potassium 3.4 (L) 3.5 - 5.1 mmol/L   Chloride 96 (L) 98 - 111 mmol/L   CO2 22 22 - 32 mmol/L   Glucose, Bld 200 (H) 70 - 99 mg/dL   BUN 24 (H) 8 - 23 mg/dL   Creatinine, Ser 1.38 (H) 0.44 - 1.00 mg/dL   Calcium 9.2 8.9 - 10.3 mg/dL   Total Protein 7.7 6.5 - 8.1 g/dL   Albumin 4.1 3.5 - 5.0 g/dL   AST 176 (H) 15 - 41 U/L   ALT 223 (H) 0 - 44 U/L   Alkaline Phosphatase 141 (H) 38 - 126 U/L   Total Bilirubin 11.0 (H) 0.3 - 1.2 mg/dL   GFR calc non Af Amer 33 (L) >60 mL/min   GFR calc Af Amer 39 (L) >60 mL/min    Comment: (NOTE) The eGFR has been calculated using the CKD EPI equation. This calculation has not been validated in all clinical situations. eGFR's persistently <60 mL/min signify possible Chronic Kidney Disease.    Anion gap 16 (H) 5 - 15    Comment: Performed at [facility], [place] [address] [place], [place] [address]  POCT urinalysis dip (device)     Status: Abnormal   Collection Time: 06/20/18  2:41 PM  Result Value  Ref Range   Glucose, UA 100 (A) NEGATIVE mg/dL   Bilirubin Urine LARGE (A) NEGATIVE   Ketones, ur TRACE (A) NEGATIVE mg/dL   Specific Gravity, Urine >=1.030 1.005 - 1.030   Hgb urine dipstick MODERATE (A) NEGATIVE   pH 5.5 5.0 -  8.0   Protein, ur 100 (A) NEGATIVE mg/dL   Urobilinogen, UA 2.0 (H) 0.0 - 1.0 mg/dL   Nitrite NEGATIVE NEGATIVE   Leukocytes, UA LARGE (A) NEGATIVE    Comment: Biochemical Testing Only. Please order routine urinalysis from main lab if confirmatory testing is needed.  Glucose, capillary     Status: Abnormal   Collection Time: 06/20/18  3:11 PM  Result Value Ref Range   Glucose-Capillary 220 (H) 70 - 99 mg/dL  WOUND CULTURE     Status: Abnormal   Collection Time: 08/14/18  4:00 PM  Result Value Ref Range   Gram Stain Result Final report    Organism ID, Bacteria Comment     Comment: Few white blood cells.   Organism ID, Bacteria No organisms seen    Aerobic Bacterial Culture Final report (A)    Organism ID, Bacteria Staphylococcus aureus (A)     Comment: Scant growth Methicillin resistant (MRSA) Based on resistance to oxacillin this isolate would be resistant to all currently available beta-lactam antimicrobial agents, with the exception of the newer cephalosporins with anti-MRSA activity, such as Ceftaroline    Antimicrobial Susceptibility Comment     Comment:       ** S = Susceptible; I = Intermediate; R = Resistant **                    P = Positive; N = Negative             MICS are expressed in micrograms per mL    Antibiotic                 RSLT#1    RSLT#2    RSLT#3    RSLT#4 Ciprofloxacin                  R Clindamycin                    R Erythromycin                   R Gentamicin                     S Levofloxacin                   R Linezolid                      S Oxacillin                      R Penicillin                     R Rifampin                       S Tetracycline                   R Trimethoprim/Sulfa              R Vancomycin                     S     Objective: There were no vitals filed for this visit.  General: Patient is awake, alert, oriented x 3 and in no acute distress.  Dermatology: Skin is warm and dry bilateral with a full thickness ulceration present  Plantar aspect of right third toe that measures 1 x  1 cm and probes to the dorsal skin with no distal phalanx noted consistent with clinical osteomyelitis with history of previous bone removal in office.  The ulceration bed has a keratotic margin with a ulcerated granular base with mild surrounding soft tissue swelling blanchable redness and minimal warmth no mild odor serosanguineous drainage.  No other acute signs of infection.   Vascular: Dorsalis Pedis pulse = 1/4 Bilateral,  Posterior Tibial pulse = 1/4 Bilateral,  Capillary Fill Time < 5 seconds  Neurologic: Protective sensation diminished bilateral especially on the right.  Musculosketal: There is significant pedal digital deformity bilateral.  Xrays, right foot especially at the area of concern there is a lucency related to the ulcer defect at the right third toe with complete disappearance and resolution of the distal phalanx however there is no change with the proximal phalanx or any other bones that could represent worsening infection or significant gas in the soft tissues.  Digital deformity present.  No results for input(s): GRAMSTAIN, LABORGA in the last 8760 hours.  Assessment and Plan:  Problem List Items Addressed This Visit    None    Visit Diagnoses    Toe ulcer, right, with necrosis of bone ([place])    -  Primary   Relevant Orders   DG Foot Complete Right (Completed)   WOUND CULTURE   Acute osteomyelitis of toe, right (HCC)       Relevant Medications   gentamicin cream (GARAMYCIN) 0.1 %   Cellulitis and abscess of toe of right foot           -Examined patient and discussed the progression of the wound and treatment alternatives. -Xrays reviewed - Excisionally  dedbrided ulceration at right third toe to healthy bleeding borders removing nonviable tissue using a sterile chisel blade. Wound measures post debridement as above. Wound was debrided to the level of the dermis with viable wound base exposed to promote healing. Hemostasis was achieved with manuel pressure. Patient tolerated procedure well without any discomfort or anesthesia necessary for this wound debridement.  -Applied antibiotic cream and ordered gentamicin for positive MRSA culture and dry sterile dressing and instructed patient to continue with daily dressings at home consisting of the same daily with assistance from daughters -Patient to continue with doxycycline and is awaiting infectious disease referral; additional wound culture was obtained this visit to see if there is any other organisms that are present since last cultures that could be producing some type of resistance or even new infection here -Patient to continue with close vascular follow-up with Dr. Jacqualyn Posey -Patient tentatively scheduled for toe amputation with [name] and was advised to keep this surgical plan and if anything worsens between now and time of surgery to call the office for follow-up recommendations - Advised patient to go to the ER or return to office if the wound worsens or if constitutional symptoms are present. -Patient to return to office next week as scheduled for OR or sooner if problems arise.  Landis Martins, DPM

## 2018-08-26 NOTE — Telephone Encounter (Signed)
I am calling to let you know I was able to get your mother-in-law's surgery scheduled for October 17.  However, it will be done at [facility].  "Okay so it will be done at [facility] instead of [facility].  I appreciate you letting me know."  Did she get her history and physical form completed by her primary care doctor.  "To my knowledge her daughter took the form to her doctor."  I have not received anything.  Can you follow-up on that and have them to fax it to me?  My fax number is at the bottom of the cover letter.  "I will work on that once I hang up with you."  Thank you so much.

## 2018-08-28 ENCOUNTER — Telehealth: Payer: Self-pay | Admitting: *Deleted

## 2018-08-28 LAB — WOUND CULTURE
MICRO NUMBER:: 91209173
RESULT:: NO GROWTH
SPECIMEN QUALITY: ADEQUATE

## 2018-08-28 NOTE — Telephone Encounter (Signed)
"  I was just calling because I wanted to see if you received our medical clearance fax that we sent over yesterday afternoon.  If you would, give our office a call back."

## 2018-08-31 ENCOUNTER — Ambulatory Visit (INDEPENDENT_AMBULATORY_CARE_PROVIDER_SITE_OTHER): Payer: Medicare Other | Admitting: Internal Medicine

## 2018-08-31 ENCOUNTER — Encounter (HOSPITAL_COMMUNITY): Payer: Self-pay

## 2018-08-31 DIAGNOSIS — M869 Osteomyelitis, unspecified: Secondary | ICD-10-CM | POA: Diagnosis not present

## 2018-08-31 MED ORDER — TEDIZOLID PHOSPHATE 200 MG PO TABS: 200.0000 mg | ORAL_TABLET | Freq: Every day | ORAL | 0 refills | Status: DC

## 2018-08-31 NOTE — Progress Notes (Signed)
RFV: new community referral for osteomyelitis  Patient ID: [name], female   DOB: [birth year], 84 y.o.   MRN: [id number]  HPI 84yo F with vascular disease hx of 3rd toe ulcer/osteo at distal tip. Has had episode of getting incredibly large "small sausage" and erythematous, only improved with the use of gent cream. Going to be seeing surgery for partial amputation  Methicillin resistant (MRSA)  Based on resistance to oxacillin this isolate would be resistant to  all currently available beta-lactam antimicrobial agents, with the  exception of the newer cephalosporins with anti-MRSA activity, such as  Ceftaroline   Antimicrobial Susceptibility Comment   Comment:   ** S = Susceptible; I = Intermediate; R = Resistant **           P = Positive; N = Negative        MICS are expressed in micrograms per mL   Antibiotic         RSLT#1  RSLT#2  RSLT#3  RSLT#4  Ciprofloxacin         R  Clindamycin          R  Erythromycin          R  Gentamicin           S  Levofloxacin          R  Linezolid           S  Oxacillin           R  Penicillin           R  Rifampin            S  Tetracycline          R  Trimethoprim/Sulfa       R  Vancomycin           S     Outpatient Encounter Medications as of 08/31/2018  Medication Sig  . acetaminophen (TYLENOL) 325 MG tablet Take 650 mg by mouth daily.   Marland Kitchen aspirin EC 325 MG tablet Take 325 mg by mouth at bedtime.  . calcium carbonate (OSCAL) 1500 (600 Ca) MG TABS tablet Take 600 mg of elemental calcium by mouth daily with breakfast.   . docusate sodium (COLACE) 100 MG capsule Take 1 capsule (100 mg total) by mouth daily. (Patient taking differently: Take 100 mg by mouth 2 (two) times daily as needed (constipation.). )  . doxycycline (VIBRA-TABS) 100 MG tablet Take 1 tablet (100 mg total) by mouth 2 (two)  times daily.  . ferrous sulfate 325 (65 FE) MG tablet Take 325 mg by mouth daily with breakfast.  . gentamicin cream (GARAMYCIN) 0.1 % Apply 1 application topically 3 (three) times daily. (Patient taking differently: Apply 1 application topically daily. Applied to affected area of toe)  . linagliptin (TRADJENTA) 5 MG TABS tablet Take 5 mg by mouth daily.  . metFORMIN (GLUCOPHAGE) 1000 MG tablet Take 0.5 tablets (500 mg total) by mouth 2 (two) times daily.  . metoprolol succinate (TOPROL-XL) 25 MG 24 hr tablet Take 25 mg by mouth at bedtime.   . pantoprazole (PROTONIX) 40 MG tablet Take 1 tablet (40 mg total) by mouth daily. (Patient taking differently: Take 40 mg by mouth every evening. )  . vitamin B-12 (CYANOCOBALAMIN) 1000 MCG tablet Take 1,000 mcg by mouth daily.  . Vitamin D, Ergocalciferol, (DRISDOL) 50000 units CAPS capsule Take 50,000 Units by mouth every Sunday. Sundays   . warfarin (COUMADIN) 2 MG tablet  Take 1-2 mg by mouth See admin instructions. Take 0.5 tablet (1 mg) by mouth on Thursday evenings & take 1 tablet (2 mg) by mouth on all other nights.   No facility-administered encounter medications on file as of 08/31/2018.      Patient Active Problem List   Diagnosis Date Noted  . Degeneration of lumbar intervertebral disc 12/11/2017  . Cough   . Hematoma   . Groin swelling 04/12/2017  . Hematoma of groin 04/12/2017  . Critical lower limb ischemia 04/08/2017  . Long term (current) use of anticoagulants [Z79.01] 12/13/2015  . Closed dislocation of tarsometatarsal joint 10/05/2015  . ULCER OF OTHER PART OF FOOT 06/06/2010  . ESSENTIAL HYPERTENSION 03/26/2010  . ATRIAL FIBRILLATION 03/26/2010  . CUTANEOUS ERUPTIONS, DRUG-INDUCED 03/26/2010  . DIARRHEA 03/26/2010  . GERD 03/20/2010  . UNSPEC LOCAL INFECTION SKIN&SUBCUTANEOUS TISSUE 03/20/2010  . OSTEOARTHRITIS 03/20/2010  . ARTHRITIS, SEPTIC 03/14/2010  . OTH COMPLICATIONS DUE INTERNAL JOINT PROSTHESIS 02/22/2010      Health Maintenance Due  Topic Date Due  . FOOT EXAM  [birth year]10/1938  . OPHTHALMOLOGY EXAM  08/10/1940  . URINE MICROALBUMIN  08/10/1940  . DEXA SCAN  08/11/1995  . PNA vac Low Risk Adult (1 of 2 - PCV13) 08/11/1995  . HEMOGLOBIN A1C  10/15/2017  . INFLUENZA VACCINE  06/18/2018    Social History   Tobacco Use  . Smoking status: Never Smoker  . Smokeless tobacco: Never Used  Substance Use Topics  . Alcohol use: Not Currently  . Drug use: No  family history includes Cancer in her mother. Review of Systems Review of Systems  Constitutional: Negative for fever, chills, diaphoresis, activity change, appetite change, fatigue and unexpected weight change.  HENT: Negative for congestion, sore throat, rhinorrhea, sneezing, trouble swallowing and sinus pressure.  Eyes: Negative for photophobia and visual disturbance.  Respiratory: Negative for cough, chest tightness, shortness of breath, wheezing and stridor.  Cardiovascular: Negative for chest pain, palpitations and leg swelling.  Gastrointestinal: Negative for nausea, vomiting, abdominal pain, diarrhea, constipation, blood in stool, abdominal distention and anal bleeding.  Genitourinary: Negative for dysuria, hematuria, flank pain and difficulty urinating.  Musculoskeletal: Negative for myalgias, back pain, joint swelling, arthralgias and gait problem.  Skin: Negative for color change, pallor, rash and wound.  Neurological: Negative for dizziness, tremors, weakness and light-headedness.  Hematological: Negative for adenopathy. Does not bruise/bleed easily.  Psychiatric/Behavioral: Negative for behavioral problems, confusion, sleep disturbance, dysphoric mood, decreased concentration and agitation.    Physical Exam   There were no vitals taken for this visit. Physical Exam  Constitutional:  oriented to person, place, and time. appears well-developed and well-nourished. No distress.  HENT: [place]/AT, PERRLA, no scleral  icterus Mouth/Throat: Oropharynx is clear and moist. No oropharyngeal exudate.  Cardiovascular: Normal rate, regular rhythm and normal heart sounds. Exam reveals no gallop and no friction rub.  No murmur heard.  Pulmonary/Chest: Effort normal and breath sounds normal. No respiratory distress.  has no wheezes.  Neck = supple, no nuchal rigidity Abdominal: Soft. Bowel sounds are normal.  exhibits no distension. There is no tenderness.  Lymphadenopathy: no cervical adenopathy. No axillary adenopathy Neurological: alert and oriented to person, place, and time.  Skin: Skin is warm and dry. No rash noted. No erythema. Right foot toe has ulcer at plantar aspect of base of toe. Mild erythema. No drinage Psychiatric: a normal mood and affect.  behavior is normal.   CBC Lab Results  Component Value Date   WBC 16.8 (H) 06/20/2018  RBC 5.91 (H) 06/20/2018   HGB 18.8 (H) 06/20/2018   HCT 56.8 (H) 06/20/2018   PLT 187 06/20/2018   MCV 96.1 06/20/2018   MCH 31.8 06/20/2018   MCHC 33.1 06/20/2018   RDW 13.1 06/20/2018   LYMPHSABS 0.4 (L) 06/20/2018   MONOABS 1.1 (H) 06/20/2018   EOSABS 0.0 06/20/2018    BMET Lab Results  Component Value Date   NA 134 (L) 06/20/2018   K 3.4 (L) 06/20/2018   CL 96 (L) 06/20/2018   CO2 22 06/20/2018   GLUCOSE 200 (H) 06/20/2018   BUN 24 (H) 06/20/2018   CREATININE 1.38 (H) 06/20/2018   CALCIUM 9.2 06/20/2018   GFRNONAA 33 (L) 06/20/2018   GFRAA 39 (L) 06/20/2018      Assessment and Plan Will check labs today- sed rate and crp Stop using doxy since it is resistant, not sure if gent cream makes any significant difference See if can do tedzolid 200mg  daily -10d

## 2018-08-31 NOTE — Patient Instructions (Addendum)
Your procedure is scheduled on: Thursday, Oct. 17, 2019   Surgery Time:  1:00PM-1:45PM   Report to [facility]  Entrance    Report to admitting at 11:00 AM   Call this number if you have problems the morning of surgery [phone]   Do not eat food:After Midnight.   May have liquids until 7:00AM morning of surgery      CLEAR LIQUID DIET   Foods Allowed                                                                     Foods Excluded  Coffee and tea, regular and decaf                             liquids that you cannot  Plain Jell-O in any flavor                                             see through such as: Fruit ices (not with fruit pulp)                                     milk, soups, orange juice  Iced Popsicles                                    All solid food Carbonated beverages, regular and diet                                    Cranberry, grape and apple juices Sports drinks like Gatorade Lightly seasoned clear broth or consume(fat free) Sugar, honey syrup  Sample Menu Breakfast                                Lunch                                     Supper Cranberry juice                    Beef broth                            Chicken broth Jell-O                                     Grape juice                           Apple juice Coffee or tea  Jell-O                                      Popsicle                                                Coffee or tea                        Coffee or tea   Brush your teeth the morning of surgery.   Do NOT smoke after Midnight   Take these medicines the morning of surgery with A SIP OF WATER: None  DO NOT TAKE ANY DIABETIC MEDICATIONS DAY OF YOUR SURGERY                               You may not have any metal on your body including hair pins, jewelry, and body piercings             Do not wear make-up, lotions, powders, perfumes/cologne, or deodorant             Do not  wear nail polish.  Do not shave  48 hours prior to surgery.                Do not bring valuables to the hospital. [place].   Contacts, dentures or bridgework may not be worn into surgery.    Patients discharged the day of surgery will not be allowed to drive home.   Name and phone number of your driver:   Special Instructions: Bring a copy of your healthcare power of attorney and living will documents         the day of surgery if you haven't scanned them in before.              Please read over the following fact sheets you were given:  [facility] - Preparing for Surgery Before surgery, you can play an important role.  Because skin is not sterile, your skin needs to be as free of germs as possible.  You can reduce the number of germs on your skin by washing with CHG (chlorahexidine gluconate) soap before surgery.  CHG is an antiseptic cleaner which kills germs and bonds with the skin to continue killing germs even after washing. Please DO NOT use if you have an allergy to CHG or antibacterial soaps.  If your skin becomes reddened/irritated stop using the CHG and inform your nurse when you arrive at Short Stay. Do not shave (including legs and underarms) for at least 48 hours prior to the first CHG shower.  You may shave your face/neck.  Please follow these instructions carefully:  1.  Shower with CHG Soap the night before surgery and the  morning of surgery.  2.  If you choose to wash your hair, wash your hair first as usual with your normal  shampoo.  3.  After you shampoo, rinse your hair and body thoroughly to remove the shampoo.  4.  Use CHG as you would any other liquid soap.  You can apply chg directly to the skin and wash.  Gently with a scrungie or clean washcloth.  5.  Apply the CHG Soap to your body ONLY FROM THE NECK DOWN.   Do   not use on face/ open                           Wound or open sores.  Avoid contact with eyes, ears mouth and   genitals (private parts).                       Wash face,  Genitals (private parts) with your normal soap.             6.  Wash thoroughly, paying special attention to the area where your    surgery  will be performed.  7.  Thoroughly rinse your body with warm water from the neck down.  8.  DO NOT shower/wash with your normal soap after using and rinsing off the CHG Soap.                9.  Pat yourself dry with a clean towel.            10.  Wear clean pajamas.            11.  Place clean sheets on your bed the night of your first shower and do not  sleep with pets. Day of Surgery : Do not apply any lotions/deodorants the morning of surgery.  Please wear clean clothes to the hospital/surgery center.  FAILURE TO FOLLOW THESE INSTRUCTIONS MAY RESULT IN THE CANCELLATION OF YOUR SURGERY  PATIENT SIGNATURE_________________________________  NURSE SIGNATURE__________________________________  ________________________________________________________________________

## 2018-09-01 ENCOUNTER — Encounter (HOSPITAL_COMMUNITY): Payer: Self-pay

## 2018-09-01 ENCOUNTER — Encounter (HOSPITAL_COMMUNITY)
Admission: RE | Admit: 2018-09-01 | Discharge: 2018-09-01 | Disposition: A | Discharge status: Home / Self Care | Payer: Medicare Other | Source: Ambulatory Visit | Attending: Podiatry | Admitting: Podiatry

## 2018-09-01 ENCOUNTER — Other Ambulatory Visit: Payer: Self-pay

## 2018-09-01 DIAGNOSIS — L97529 Non-pressure chronic ulcer of other part of left foot with unspecified severity: Secondary | ICD-10-CM | POA: Diagnosis not present

## 2018-09-01 DIAGNOSIS — Z01818 Encounter for other preprocedural examination: Secondary | ICD-10-CM

## 2018-09-01 DIAGNOSIS — Z79899 Other long term (current) drug therapy: Secondary | ICD-10-CM | POA: Diagnosis not present

## 2018-09-01 DIAGNOSIS — M868X7 Other osteomyelitis, ankle and foot: Secondary | ICD-10-CM | POA: Insufficient documentation

## 2018-09-01 DIAGNOSIS — E119 Type 2 diabetes mellitus without complications: Secondary | ICD-10-CM

## 2018-09-01 DIAGNOSIS — Z7982 Long term (current) use of aspirin: Secondary | ICD-10-CM | POA: Insufficient documentation

## 2018-09-01 DIAGNOSIS — E1169 Type 2 diabetes mellitus with other specified complication: Secondary | ICD-10-CM | POA: Diagnosis not present

## 2018-09-01 DIAGNOSIS — Z01810 Encounter for preprocedural cardiovascular examination: Secondary | ICD-10-CM | POA: Diagnosis not present

## 2018-09-01 DIAGNOSIS — I872 Venous insufficiency (chronic) (peripheral): Secondary | ICD-10-CM | POA: Diagnosis not present

## 2018-09-01 DIAGNOSIS — E11621 Type 2 diabetes mellitus with foot ulcer: Secondary | ICD-10-CM | POA: Diagnosis not present

## 2018-09-01 DIAGNOSIS — I4891 Unspecified atrial fibrillation: Secondary | ICD-10-CM | POA: Insufficient documentation

## 2018-09-01 DIAGNOSIS — L97519 Non-pressure chronic ulcer of other part of right foot with unspecified severity: Secondary | ICD-10-CM | POA: Diagnosis not present

## 2018-09-01 DIAGNOSIS — Z7901 Long term (current) use of anticoagulants: Secondary | ICD-10-CM | POA: Diagnosis not present

## 2018-09-01 DIAGNOSIS — I4510 Unspecified right bundle-branch block: Secondary | ICD-10-CM

## 2018-09-01 DIAGNOSIS — Z7984 Long term (current) use of oral hypoglycemic drugs: Secondary | ICD-10-CM

## 2018-09-01 DIAGNOSIS — M1990 Unspecified osteoarthritis, unspecified site: Secondary | ICD-10-CM | POA: Diagnosis not present

## 2018-09-01 DIAGNOSIS — R9431 Abnormal electrocardiogram [ECG] [EKG]: Secondary | ICD-10-CM

## 2018-09-01 DIAGNOSIS — Z01812 Encounter for preprocedural laboratory examination: Secondary | ICD-10-CM | POA: Diagnosis not present

## 2018-09-01 HISTORY — DX: Rheumatoid arthritis, unspecified: M06.9

## 2018-09-01 HISTORY — DX: Dyskinesia of esophagus: K22.4

## 2018-09-01 HISTORY — DX: Unspecified atrial fibrillation: I48.91

## 2018-09-01 HISTORY — DX: Peripheral vascular disease, unspecified: I73.9

## 2018-09-01 HISTORY — DX: Unspecified osteoarthritis, unspecified site: M19.90

## 2018-09-01 HISTORY — DX: Low back pain, unspecified: M54.50

## 2018-09-01 HISTORY — DX: Polyp of corpus uteri: N84.0

## 2018-09-01 HISTORY — DX: Hyperlipidemia, unspecified: E78.5

## 2018-09-01 HISTORY — DX: Unspecified jaundice: R17

## 2018-09-01 HISTORY — DX: Type 2 diabetes mellitus without complications: E11.9

## 2018-09-01 HISTORY — DX: Unspecified ovarian cyst, unspecified side: N83.209

## 2018-09-01 HISTORY — DX: Personal history of other diseases of the digestive system: Z87.19

## 2018-09-01 HISTORY — DX: Low back pain: M54.5

## 2018-09-01 HISTORY — DX: Localized enlarged lymph nodes: R59.0

## 2018-09-01 HISTORY — DX: Unsteadiness on feet: R26.81

## 2018-09-01 HISTORY — DX: Nocturia: R35.1

## 2018-09-01 HISTORY — DX: Methicillin resistant Staphylococcus aureus infection, unspecified site: A49.02

## 2018-09-01 HISTORY — DX: Other acute osteomyelitis, unspecified site: M86.10

## 2018-09-01 HISTORY — DX: Spinal stenosis, site unspecified: M48.00

## 2018-09-01 HISTORY — DX: Pleural effusion, not elsewhere classified: J90

## 2018-09-01 HISTORY — DX: Cellulitis, unspecified: L03.90

## 2018-09-01 HISTORY — DX: Age-related osteoporosis without current pathological fracture: M81.0

## 2018-09-01 HISTORY — DX: Other intervertebral disc degeneration, lumbar region: M51.36

## 2018-09-01 HISTORY — DX: Non-pressure chronic ulcer of other part of right foot with necrosis of bone: L97.514

## 2018-09-01 HISTORY — DX: Carpal tunnel syndrome, bilateral upper limbs: G56.03

## 2018-09-01 HISTORY — DX: Other injury of unspecified body region, initial encounter: T14.8XXA

## 2018-09-01 HISTORY — DX: Wedge compression fracture of fifth lumbar vertebra, initial encounter for closed fracture: S32.050A

## 2018-09-01 HISTORY — DX: Unspecified urinary incontinence: R32

## 2018-09-01 HISTORY — DX: Asymptomatic varicose veins of unspecified lower extremity: I83.90

## 2018-09-01 HISTORY — DX: Other intervertebral disc degeneration, lumbar region without mention of lumbar back pain or lower extremity pain: M51.369

## 2018-09-01 HISTORY — DX: Polyneuropathy, unspecified: G62.9

## 2018-09-01 HISTORY — DX: Other forms of dyspnea: R06.09

## 2018-09-01 HISTORY — DX: Dysphagia, unspecified: R13.10

## 2018-09-01 HISTORY — DX: Dyspnea, unspecified: R06.00

## 2018-09-01 LAB — BASIC METABOLIC PANEL
ANION GAP: 8 (ref 5–15)
BUN/Creatinine Ratio: 26 (calc) — ABNORMAL HIGH (ref 6–22)
BUN: 26 mg/dL — ABNORMAL HIGH (ref 7–25)
BUN: 26 mg/dL — ABNORMAL HIGH (ref 8–23)
CALCIUM: 9.4 mg/dL (ref 8.6–10.4)
CHLORIDE: 104 mmol/L (ref 98–111)
CO2: 26 mmol/L (ref 20–32)
CO2: 27 mmol/L (ref 22–32)
Calcium: 9.4 mg/dL (ref 8.9–10.3)
Chloride: 106 mmol/L (ref 98–110)
Creat: 0.99 mg/dL — ABNORMAL HIGH (ref 0.60–0.88)
Creatinine, Ser: 0.98 mg/dL (ref 0.44–1.00)
GFR calc non Af Amer: 50 mL/min — ABNORMAL LOW (ref >60)
GFR, EST AFRICAN AMERICAN: 58 mL/min — AB (ref >60)
GLUCOSE: 153 mg/dL — AB (ref 65–99)
Glucose, Bld: 195 mg/dL — ABNORMAL HIGH (ref 70–99)
POTASSIUM: 4.5 mmol/L (ref 3.5–5.3)
Potassium: 5 mmol/L (ref 3.5–5.1)
SODIUM: 139 mmol/L (ref 135–145)
Sodium: 141 mmol/L (ref 135–146)

## 2018-09-01 LAB — CBC WITH DIFFERENTIAL/PLATELET
Basophils Absolute: 73 cells/uL (ref 0–200)
Basophils Relative: 0.9 %
EOS PCT: 3 %
Eosinophils Absolute: 243 cells/uL (ref 15–500)
HEMATOCRIT: 46.3 % — AB (ref 35.0–45.0)
Hemoglobin: 15.4 g/dL (ref 11.7–15.5)
Lymphs Abs: 1531 cells/uL (ref 850–3900)
MCH: 31.8 pg (ref 27.0–33.0)
MCHC: 33.3 g/dL (ref 32.0–36.0)
MCV: 95.5 fL (ref 80.0–100.0)
MPV: 10.8 fL (ref 7.5–12.5)
Monocytes Relative: 7.9 %
NEUTROS ABS: 5613 {cells}/uL (ref 1500–7800)
Neutrophils Relative %: 69.3 %
Platelets: 216 10*3/uL (ref 140–400)
RBC: 4.85 10*6/uL (ref 3.80–5.10)
RDW: 12.1 % (ref 11.0–15.0)
Total Lymphocyte: 18.9 %
WBC mixed population: 640 cells/uL (ref 200–950)
WBC: 8.1 10*3/uL (ref 3.8–10.8)

## 2018-09-01 LAB — CBC
HCT: 48.8 % — ABNORMAL HIGH (ref 36.0–46.0)
Hemoglobin: 15.8 g/dL — ABNORMAL HIGH (ref 12.0–15.0)
MCH: 31.9 pg (ref 26.0–34.0)
MCHC: 32.4 g/dL (ref 30.0–36.0)
MCV: 98.6 fL (ref 80.0–100.0)
NRBC: 0 % (ref 0.0–0.2)
PLATELETS: 202 10*3/uL (ref 150–400)
RBC: 4.95 MIL/uL (ref 3.87–5.11)
RDW: 13.3 % (ref 11.5–15.5)
WBC: 8.5 10*3/uL (ref 4.0–10.5)

## 2018-09-01 LAB — SURGICAL PCR SCREEN
MRSA, PCR: NEGATIVE
Staphylococcus aureus: NEGATIVE

## 2018-09-01 LAB — HEMOGLOBIN A1C
Hgb A1c MFr Bld: 7.3 % — ABNORMAL HIGH (ref 4.8–5.6)
MEAN PLASMA GLUCOSE: 162.81 mg/dL

## 2018-09-01 LAB — GLUCOSE, CAPILLARY: GLUCOSE-CAPILLARY: 171 mg/dL — AB (ref 70–99)

## 2018-09-01 LAB — SEDIMENTATION RATE: Sed Rate: 2 mm/h (ref 0–30)

## 2018-09-01 LAB — C-REACTIVE PROTEIN: CRP: 2.7 mg/L (ref <8.0)

## 2018-09-01 NOTE — Pre-Procedure Instructions (Signed)
CBC and BMP results 09/01/2018 faxed via epic to [name].

## 2018-09-02 NOTE — Anesthesia Preprocedure Evaluation (Addendum)
Anesthesia Evaluation  Patient identified by MRN, date of birth, ID band Patient awake    Reviewed: Allergy & Precautions, NPO status , Patient's Chart, lab work & pertinent test results  Airway Mallampati: I  TM Distance: >3 FB Neck ROM: Full    Dental no notable dental hx. (+) Teeth Intact, Dental Advisory Given   Pulmonary shortness of breath,    Pulmonary exam normal breath sounds clear to auscultation       Cardiovascular hypertension, + Peripheral Vascular Disease  Normal cardiovascular exam+ dysrhythmias  Rhythm:Regular Rate:Normal     Neuro/Psych negative psych ROS   GI/Hepatic Neg liver ROS, hiatal hernia, GERD  ,  Endo/Other  diabetes, Type 2  Renal/GU negative Renal ROS     Musculoskeletal  (+) Arthritis ,   Abdominal   Peds  Hematology negative hematology ROS (+) anemia ,   Anesthesia Other Findings   Reproductive/Obstetrics                            Anesthesia Physical Anesthesia Plan  ASA: III  Anesthesia Plan: MAC   Post-op Pain Management:    Induction:   PONV Risk Score and Plan: Treatment may vary due to age or medical condition  Airway Management Planned: Natural Airway and Nasal Cannula  Additional Equipment:   Intra-op Plan:   Post-operative Plan:   Informed Consent: I have reviewed the patients History and Physical, chart, labs and discussed the procedure including the risks, benefits and alternatives for the proposed anesthesia with the patient or authorized representative who has indicated his/her understanding and acceptance.   Dental advisory given  Plan Discussed with:   Anesthesia Plan Comments:        Anesthesia Quick Evaluation

## 2018-09-02 NOTE — Pre-Procedure Instructions (Signed)
Reviewed medical history with Dr. Valma Cava no cardiac clearance needed for procedure 09/03/2018.

## 2018-09-02 NOTE — Pre-Procedure Instructions (Signed)
Hgb A1C results 09/01/2018 sent to [name] via epic.

## 2018-09-03 ENCOUNTER — Ambulatory Visit (HOSPITAL_COMMUNITY): Payer: Medicare Other | Admitting: Anesthesiology

## 2018-09-03 ENCOUNTER — Encounter (HOSPITAL_COMMUNITY): Payer: Self-pay | Admitting: Certified Registered Nurse Anesthetist

## 2018-09-03 ENCOUNTER — Ambulatory Visit (HOSPITAL_COMMUNITY)
Admission: RE | Admit: 2018-09-03 | Discharge: 2018-09-03 | Disposition: A | Discharge status: Home / Self Care | Payer: Medicare Other | Source: Ambulatory Visit | Attending: Podiatry | Admitting: Podiatry

## 2018-09-03 ENCOUNTER — Encounter: Payer: Self-pay | Admitting: Podiatry

## 2018-09-03 ENCOUNTER — Encounter (HOSPITAL_COMMUNITY)
Admission: RE | Disposition: A | Discharge status: Home / Self Care | Payer: Self-pay | Source: Ambulatory Visit | Attending: Podiatry

## 2018-09-03 DIAGNOSIS — Z01812 Encounter for preprocedural laboratory examination: Secondary | ICD-10-CM | POA: Diagnosis not present

## 2018-09-03 DIAGNOSIS — Z7984 Long term (current) use of oral hypoglycemic drugs: Secondary | ICD-10-CM | POA: Diagnosis not present

## 2018-09-03 DIAGNOSIS — Z79899 Other long term (current) drug therapy: Secondary | ICD-10-CM | POA: Diagnosis not present

## 2018-09-03 DIAGNOSIS — L97529 Non-pressure chronic ulcer of other part of left foot with unspecified severity: Secondary | ICD-10-CM | POA: Insufficient documentation

## 2018-09-03 DIAGNOSIS — E11621 Type 2 diabetes mellitus with foot ulcer: Secondary | ICD-10-CM | POA: Diagnosis not present

## 2018-09-03 DIAGNOSIS — I872 Venous insufficiency (chronic) (peripheral): Secondary | ICD-10-CM | POA: Insufficient documentation

## 2018-09-03 DIAGNOSIS — Z7901 Long term (current) use of anticoagulants: Secondary | ICD-10-CM | POA: Diagnosis not present

## 2018-09-03 DIAGNOSIS — L97519 Non-pressure chronic ulcer of other part of right foot with unspecified severity: Secondary | ICD-10-CM | POA: Diagnosis not present

## 2018-09-03 DIAGNOSIS — Z01810 Encounter for preprocedural cardiovascular examination: Secondary | ICD-10-CM | POA: Diagnosis not present

## 2018-09-03 DIAGNOSIS — M1990 Unspecified osteoarthritis, unspecified site: Secondary | ICD-10-CM | POA: Diagnosis not present

## 2018-09-03 DIAGNOSIS — L97512 Non-pressure chronic ulcer of other part of right foot with fat layer exposed: Secondary | ICD-10-CM | POA: Diagnosis not present

## 2018-09-03 DIAGNOSIS — M869 Osteomyelitis, unspecified: Secondary | ICD-10-CM | POA: Diagnosis not present

## 2018-09-03 DIAGNOSIS — E1169 Type 2 diabetes mellitus with other specified complication: Secondary | ICD-10-CM | POA: Insufficient documentation

## 2018-09-03 HISTORY — PX: AMPUTATION TOE: SHX6595

## 2018-09-03 LAB — PROTIME-INR
INR: 1.13
Prothrombin Time: 14.4 seconds (ref 11.4–15.2)

## 2018-09-03 LAB — GLUCOSE, CAPILLARY
GLUCOSE-CAPILLARY: 139 mg/dL — AB (ref 70–99)
Glucose-Capillary: 120 mg/dL — ABNORMAL HIGH (ref 70–99)

## 2018-09-03 SURGERY — AMPUTATION, TOE
Anesthesia: Monitor Anesthesia Care | Laterality: Right

## 2018-09-03 MED ORDER — BUPIVACAINE HCL (PF) 0.5 % IJ SOLN
INTRAMUSCULAR | Status: AC
  Filled 2018-09-03: qty 30

## 2018-09-03 MED ORDER — ACETAMINOPHEN 10 MG/ML IV SOLN
INTRAVENOUS | Status: AC
  Administered 2018-09-03: 740 mg via INTRAVENOUS
  Filled 2018-09-03: qty 100

## 2018-09-03 MED ORDER — BUPIVACAINE HCL (PF) 0.5 % IJ SOLN
INTRAMUSCULAR | Status: DC | PRN
  Administered 2018-09-03: 4 mL

## 2018-09-03 MED ORDER — CIPROFLOXACIN IN D5W 400 MG/200ML IV SOLN
INTRAVENOUS | Status: AC
  Filled 2018-09-03: qty 200

## 2018-09-03 MED ORDER — PROPOFOL 10 MG/ML IV BOLUS
INTRAVENOUS | Status: AC
  Filled 2018-09-03: qty 20

## 2018-09-03 MED ORDER — LIDOCAINE HCL 2 % IJ SOLN
INTRAMUSCULAR | Status: AC
  Filled 2018-09-03: qty 20

## 2018-09-03 MED ORDER — LACTATED RINGERS IV SOLN
INTRAVENOUS | Status: DC
  Administered 2018-09-03: 1000 mL via INTRAVENOUS

## 2018-09-03 MED ORDER — PROPOFOL 500 MG/50ML IV EMUL
INTRAVENOUS | Status: DC | PRN
  Administered 2018-09-03: 50 ug/kg/min via INTRAVENOUS

## 2018-09-03 MED ORDER — ACETAMINOPHEN 10 MG/ML IV SOLN
740.0000 mg | Freq: Once | INTRAVENOUS | Status: DC | PRN
  Administered 2018-09-03: 740 mg via INTRAVENOUS

## 2018-09-03 MED ORDER — ONDANSETRON HCL 4 MG/2ML IJ SOLN: 4.0000 mg | Freq: Once | INTRAMUSCULAR | Status: DC | PRN

## 2018-09-03 MED ORDER — FENTANYL CITRATE (PF) 100 MCG/2ML IJ SOLN: 25.0000 ug | INTRAMUSCULAR | Status: DC | PRN

## 2018-09-03 MED ORDER — CHLORHEXIDINE GLUCONATE 4 % EX LIQD: 60.0000 mL | Freq: Once | CUTANEOUS | Status: DC

## 2018-09-03 MED ORDER — CIPROFLOXACIN IN D5W 400 MG/200ML IV SOLN
INTRAVENOUS | Status: DC | PRN
  Administered 2018-09-03: 400 mg via INTRAVENOUS

## 2018-09-03 MED ORDER — LIDOCAINE HCL 2 % IJ SOLN
INTRAMUSCULAR | Status: DC | PRN
  Administered 2018-09-03: 4 mL

## 2018-09-03 MED ORDER — PROPOFOL 10 MG/ML IV BOLUS
INTRAVENOUS | Status: DC | PRN
  Administered 2018-09-03 (×2): 20 mg via INTRAVENOUS

## 2018-09-03 SURGICAL SUPPLY — 34 items
BAG SPEC THK2 15X12 ZIP CLS (MISCELLANEOUS) ×1
BAG ZIPLOCK 12X15 (MISCELLANEOUS) ×2 IMPLANT
BANDAGE ACE 4X5 VEL STRL LF (GAUZE/BANDAGES/DRESSINGS) ×2 IMPLANT
BANDAGE ACE 6X5 VEL STRL LF (GAUZE/BANDAGES/DRESSINGS) ×2 IMPLANT
BANDAGE ESMARK 6X9 LF (GAUZE/BANDAGES/DRESSINGS) ×1 IMPLANT
BNDG CMPR 9X6 STRL LF SNTH (GAUZE/BANDAGES/DRESSINGS) ×1
BNDG COHESIVE 4X5 TAN STRL (GAUZE/BANDAGES/DRESSINGS) ×2 IMPLANT
BNDG ESMARK 6X9 LF (GAUZE/BANDAGES/DRESSINGS) ×2
BNDG GAUZE ELAST 4 BULKY (GAUZE/BANDAGES/DRESSINGS) ×2 IMPLANT
COVER SURGICAL LIGHT HANDLE (MISCELLANEOUS) ×2 IMPLANT
COVER WAND RF STERILE (DRAPES) IMPLANT
CUFF TOURN SGL QUICK 34 (TOURNIQUET CUFF) ×2
CUFF TRNQT CYL 34X4X40X1 (TOURNIQUET CUFF) ×1 IMPLANT
DRAPE U-SHAPE 47X51 STRL (DRAPES) ×2 IMPLANT
DURAPREP 26ML APPLICATOR (WOUND CARE) ×2 IMPLANT
ELECT REM PT RETURN 15FT ADLT (MISCELLANEOUS) ×2 IMPLANT
GAUZE SPONGE 4X4 12PLY STRL (GAUZE/BANDAGES/DRESSINGS) ×2 IMPLANT
GAUZE XEROFORM 5X9 LF (GAUZE/BANDAGES/DRESSINGS) ×2 IMPLANT
GLOVE BIO SURGEON STRL SZ7.5 (GLOVE) ×2 IMPLANT
GLOVE BIOGEL PI IND STRL 8 (GLOVE) ×2 IMPLANT
GLOVE BIOGEL PI INDICATOR 8 (GLOVE) ×2
GLOVE ECLIPSE 8.0 STRL XLNG CF (GLOVE) ×2 IMPLANT
GOWN STRL REUS W/TWL XL LVL3 (GOWN DISPOSABLE) ×2 IMPLANT
KIT BASIN OR (CUSTOM PROCEDURE TRAY) ×2 IMPLANT
NS IRRIG 1000ML POUR BTL (IV SOLUTION) ×2 IMPLANT
PAD CAST 4YDX4 CTTN HI CHSV (CAST SUPPLIES) ×1 IMPLANT
PADDING CAST COTTON 4X4 STRL (CAST SUPPLIES) ×2
POSITIONER SURGICAL ARM (MISCELLANEOUS) ×2 IMPLANT
SPONGE LAP 18X18 RF (DISPOSABLE) ×4 IMPLANT
STAPLER VISISTAT 35W (STAPLE) ×2 IMPLANT
STOCKINETTE 8 INCH (MISCELLANEOUS) ×2 IMPLANT
SUT ETHILON 2 0 PSLX (SUTURE) ×4 IMPLANT
TOWEL OR 17X26 10 PK STRL BLUE (TOWEL DISPOSABLE) ×4 IMPLANT
YANKAUER SUCT BULB TIP NO VENT (SUCTIONS) ×2 IMPLANT

## 2018-09-03 NOTE — Brief Op Note (Signed)
09/03/2018  2:52 PM  PATIENT:  [name]  84 y.o. female  PRE-OPERATIVE DIAGNOSIS:  osteomylitis  POST-OPERATIVE DIAGNOSIS:  osteomylitis  PROCEDURE:  Procedure(s): AMPUTATION 3RD RIGHT TOE INTERPHALANGEAL (Right)  SURGEON:  Surgeon(s) and Role:    [name], DPM - Primary  PHYSICIAN ASSISTANT:   ASSISTANTS: none   ANESTHESIA:   local  EBL:  0 mL   BLOOD ADMINISTERED:none  DRAINS: none   LOCAL MEDICATIONS USED:  MARCAINE    and LIDOCAINE   SPECIMEN:  No Specimen  DISPOSITION OF SPECIMEN:  N/A  COUNTS:  YES  TOURNIQUET:  RT ankle tourniquet inflated to a pressure of 281mmHg after esmarch bandage exanguination  DICTATION: .Dragon Dictation  PLAN OF CARE: Discharge to home after PACU  PATIENT DISPOSITION:  PACU - hemodynamically stable.   Delay start of Pharmacological VTE agent (>24hrs) due to surgical blood loss or risk of bleeding: not applicable  [name], DPM [facility]  Dr. [name], DPM    [address], [place] [address]                Office [phone]  Fax [phone]

## 2018-09-03 NOTE — Transfer of Care (Signed)
Immediate Anesthesia Transfer of Care Note  Patient: [name]  Procedure(s) Performed: AMPUTATION 3RD RIGHT TOE INTERPHALANGEAL (Right )  Patient Location: PACU  Anesthesia Type:MAC  Level of Consciousness: awake, alert , oriented and drowsy  Airway & Oxygen Therapy: Patient Spontanous Breathing and Patient connected to face mask  Post-op Assessment: Report given to RN and Post -op Vital signs reviewed and stable  Post vital signs: Reviewed and stable  Last Vitals:  Vitals Value Taken Time  BP 141/72 09/03/2018  2:57 PM  Temp    Pulse 69 09/03/2018  2:58 PM  Resp 16 09/03/2018  2:58 PM  SpO2 100 % 09/03/2018  2:58 PM  Vitals shown include unvalidated device data.  Last Pain:  Vitals:   09/03/18 1202  PainSc: 0-No pain      Patients Stated Pain Goal: 4 (35/67/01 4103)  Complications: No apparent anesthesia complications

## 2018-09-03 NOTE — Telephone Encounter (Signed)
I left [name] a message that we did receive the medical clearance information.

## 2018-09-03 NOTE — Anesthesia Postprocedure Evaluation (Signed)
Anesthesia Post Note  Patient: [name]  Procedure(s) Performed: AMPUTATION 3RD RIGHT TOE INTERPHALANGEAL (Right )     Patient location during evaluation: PACU Anesthesia Type: MAC Level of consciousness: awake and alert Pain management: pain level controlled Vital Signs Assessment: post-procedure vital signs reviewed and stable Respiratory status: spontaneous breathing, nonlabored ventilation, respiratory function stable and patient connected to nasal cannula oxygen Cardiovascular status: stable and blood pressure returned to baseline Postop Assessment: no apparent nausea or vomiting Anesthetic complications: no    Last Vitals:  Vitals:   09/03/18 1114 09/03/18 1457  BP: (!) 166/93 (!) 141/72  Pulse: 78 67  Resp: 16 17  SpO2: 100% 99%    Last Pain:  Vitals:   09/03/18 1202  PainSc: 0-No pain                 Barnet Glasgow

## 2018-09-03 NOTE — Interval H&P Note (Signed)
History and Physical Interval Note:  09/03/2018 2:52 PM  [name]  has presented today for surgery, with the diagnosis of osteomylitis  The various methods of treatment have been discussed with the patient and family. After consideration of risks, benefits and other options for treatment, the patient has consented to  Procedure(s): AMPUTATION 3RD RIGHT TOE INTERPHALANGEAL (Right) as a surgical intervention .  The patient's history has been reviewed, patient examined, no change in status, stable for surgery.  I have reviewed the patient's chart and labs.  Questions were answered to the patient's satisfaction.     [name]

## 2018-09-04 ENCOUNTER — Encounter (HOSPITAL_COMMUNITY): Payer: Self-pay | Admitting: Podiatry

## 2018-09-04 DIAGNOSIS — Z4789 Encounter for other orthopedic aftercare: Secondary | ICD-10-CM | POA: Diagnosis not present

## 2018-09-04 DIAGNOSIS — M8618 Other acute osteomyelitis, other site: Secondary | ICD-10-CM | POA: Diagnosis not present

## 2018-09-04 DIAGNOSIS — E1151 Type 2 diabetes mellitus with diabetic peripheral angiopathy without gangrene: Secondary | ICD-10-CM | POA: Diagnosis not present

## 2018-09-04 DIAGNOSIS — Z7984 Long term (current) use of oral hypoglycemic drugs: Secondary | ICD-10-CM | POA: Diagnosis not present

## 2018-09-04 DIAGNOSIS — B9562 Methicillin resistant Staphylococcus aureus infection as the cause of diseases classified elsewhere: Secondary | ICD-10-CM | POA: Diagnosis not present

## 2018-09-04 DIAGNOSIS — I83893 Varicose veins of bilateral lower extremities with other complications: Secondary | ICD-10-CM | POA: Diagnosis not present

## 2018-09-07 ENCOUNTER — Telehealth: Payer: Self-pay

## 2018-09-07 MED ORDER — LINEZOLID 600 MG PO TABS: 600.0000 mg | ORAL_TABLET | Freq: Two times a day (BID) | ORAL | 0 refills | Status: AC

## 2018-09-07 NOTE — Telephone Encounter (Signed)
Per [facility] pharmacy medication will cost  $5,000.00 Medicare refused payment  Per Dr Baxter Flattery change medication to Linezolid 600 mg one bid for 10 days.    Medication sent to pharmacy.   Tedizolid removed from medication list due to insurance would not pay for medication.   Laverle Patter, RN

## 2018-09-07 NOTE — Telephone Encounter (Signed)
Calling regarding need for prior authorization of tedizolid phospate.   Needs prior authorization  Pharmacy information : Medicare Part D  ID# [id number] [place] Part D -[employer] Blue Medicare Rx.  [phone]

## 2018-09-08 DIAGNOSIS — Z4789 Encounter for other orthopedic aftercare: Secondary | ICD-10-CM | POA: Diagnosis not present

## 2018-09-08 DIAGNOSIS — M8618 Other acute osteomyelitis, other site: Secondary | ICD-10-CM | POA: Diagnosis not present

## 2018-09-08 DIAGNOSIS — E1151 Type 2 diabetes mellitus with diabetic peripheral angiopathy without gangrene: Secondary | ICD-10-CM | POA: Diagnosis not present

## 2018-09-08 DIAGNOSIS — B9562 Methicillin resistant Staphylococcus aureus infection as the cause of diseases classified elsewhere: Secondary | ICD-10-CM | POA: Diagnosis not present

## 2018-09-08 DIAGNOSIS — I83893 Varicose veins of bilateral lower extremities with other complications: Secondary | ICD-10-CM | POA: Diagnosis not present

## 2018-09-08 DIAGNOSIS — Z7984 Long term (current) use of oral hypoglycemic drugs: Secondary | ICD-10-CM | POA: Diagnosis not present

## 2018-09-08 LAB — AEROBIC/ANAEROBIC CULTURE (SURGICAL/DEEP WOUND)
CULTURE: NO GROWTH
GRAM STAIN: NONE SEEN

## 2018-09-08 LAB — AEROBIC/ANAEROBIC CULTURE W GRAM STAIN (SURGICAL/DEEP WOUND)

## 2018-09-08 NOTE — Telephone Encounter (Signed)
Patient's daughter-in- law called today stating Linezolid 600 mg is not covered under insurance. Daughter would like alternative medication to be sent into pharmacy. Patient is going to follow- up with foot doctor on 10/23. Will inform Dr. Baxter Flattery of patients call.  [place]

## 2018-09-09 ENCOUNTER — Other Ambulatory Visit: Payer: Self-pay | Admitting: Podiatry

## 2018-09-09 ENCOUNTER — Encounter: Payer: Self-pay | Admitting: Podiatry

## 2018-09-09 ENCOUNTER — Ambulatory Visit (INDEPENDENT_AMBULATORY_CARE_PROVIDER_SITE_OTHER): Payer: Medicare Other

## 2018-09-09 ENCOUNTER — Ambulatory Visit (INDEPENDENT_AMBULATORY_CARE_PROVIDER_SITE_OTHER): Payer: Medicare Other | Admitting: Podiatry

## 2018-09-09 DIAGNOSIS — I83893 Varicose veins of bilateral lower extremities with other complications: Secondary | ICD-10-CM | POA: Diagnosis not present

## 2018-09-09 DIAGNOSIS — Z9889 Other specified postprocedural states: Secondary | ICD-10-CM

## 2018-09-09 DIAGNOSIS — E1151 Type 2 diabetes mellitus with diabetic peripheral angiopathy without gangrene: Secondary | ICD-10-CM | POA: Diagnosis not present

## 2018-09-09 DIAGNOSIS — S98131A Complete traumatic amputation of one right lesser toe, initial encounter: Secondary | ICD-10-CM | POA: Diagnosis not present

## 2018-09-09 DIAGNOSIS — L97514 Non-pressure chronic ulcer of other part of right foot with necrosis of bone: Secondary | ICD-10-CM

## 2018-09-09 DIAGNOSIS — Z7984 Long term (current) use of oral hypoglycemic drugs: Secondary | ICD-10-CM | POA: Diagnosis not present

## 2018-09-09 DIAGNOSIS — Z4789 Encounter for other orthopedic aftercare: Secondary | ICD-10-CM | POA: Diagnosis not present

## 2018-09-09 DIAGNOSIS — B9562 Methicillin resistant Staphylococcus aureus infection as the cause of diseases classified elsewhere: Secondary | ICD-10-CM | POA: Diagnosis not present

## 2018-09-09 DIAGNOSIS — M8618 Other acute osteomyelitis, other site: Secondary | ICD-10-CM | POA: Diagnosis not present

## 2018-09-10 DIAGNOSIS — M8618 Other acute osteomyelitis, other site: Secondary | ICD-10-CM | POA: Diagnosis not present

## 2018-09-10 DIAGNOSIS — Z4789 Encounter for other orthopedic aftercare: Secondary | ICD-10-CM | POA: Diagnosis not present

## 2018-09-10 DIAGNOSIS — Z7984 Long term (current) use of oral hypoglycemic drugs: Secondary | ICD-10-CM | POA: Diagnosis not present

## 2018-09-10 DIAGNOSIS — E1151 Type 2 diabetes mellitus with diabetic peripheral angiopathy without gangrene: Secondary | ICD-10-CM | POA: Diagnosis not present

## 2018-09-10 DIAGNOSIS — I83893 Varicose veins of bilateral lower extremities with other complications: Secondary | ICD-10-CM | POA: Diagnosis not present

## 2018-09-10 DIAGNOSIS — B9562 Methicillin resistant Staphylococcus aureus infection as the cause of diseases classified elsewhere: Secondary | ICD-10-CM | POA: Diagnosis not present

## 2018-09-13 NOTE — Progress Notes (Signed)
   Subjective:  Patient presents today status post parital 3rd toe amputation right. DOS: 09/03/18. She reports some intermittent mild pain across the dorsum of the foot. She has been using the post op shoe without issue. There are no modifying factors noted. Patient is here for further evaluation and treatment.    Past Medical History:  Diagnosis Date  . Acute osteomyelitis ([place]) 08/25/2018   Right toe  . Anemia   . Atrial fibrillation ([place])   . Carpal tunnel syndrome, bilateral   . Cellulitis 08/25/2018   Right foot  . Cervical lymphadenopathy   . Compression fracture of fifth lumbar vertebra (HCC)    to T 11  . DDD (degenerative disc disease), lumbar   . DJD (degenerative joint disease)   . DOE (dyspnea on exertion)   . Dysphagia   . Dyspnea    with exertion  . Dysrhythmia    hx brief AF post op hip surg  . Esophageal dysmotility 01/31/2014   noted barium  . GERD (gastroesophageal reflux disease)   . Hematoma    right side after stent placement  . History of hiatal hernia 01/31/2014   Small, noted on Barium  . Hyperlipidemia   . Hypertension   . Jaundice   . Low back pain   . MRSA (methicillin resistant Staphylococcus aureus)   . Neuropathy   . Nocturia   . OA (osteoarthritis)   . Osteoporosis   . Ovarian cyst   . PAD (peripheral artery disease) ([place])   . Peripheral vascular disease ([place])   . Pleural effusion, left 04/17/2017   Small  . RA (rheumatoid arthritis) ([place])   . Spinal stenosis   . Toe ulcer, right, with necrosis of bone ([place]) 08/25/2018  . Type 2 diabetes mellitus ([place])   . Unsteady gait   . Urinary incontinence   . Uterine polyp   . Varicose vein of leg    Bilateral      Objective/Physical Exam Neurovascular status intact.  Skin incisions appear to be well coapted with sutures and staples intact. No sign of infectious process noted. No dehiscence. No active bleeding noted. Moderate edema noted to the surgical extremity.  Radiographic Exam:    Osteotomies sites appear to be stable with routine healing.  Assessment: 1. s/p partial 3rd toe amputation right. DOS: 09/03/18   Plan of Care:  1. Patient was evaluated. X-rays reviewed 2. Dressing changed.  3. Recommended antibiotic ointment daily with a bandage.  4. Continue using post op shoe.  5. Return to clinic in one week.    [name], DPM [facility]  Dr. [name], [address]                                        [place], [place] [address]                Office [phone]  Fax [phone]

## 2018-09-14 DIAGNOSIS — B9562 Methicillin resistant Staphylococcus aureus infection as the cause of diseases classified elsewhere: Secondary | ICD-10-CM | POA: Diagnosis not present

## 2018-09-14 DIAGNOSIS — I83893 Varicose veins of bilateral lower extremities with other complications: Secondary | ICD-10-CM | POA: Diagnosis not present

## 2018-09-14 DIAGNOSIS — M8618 Other acute osteomyelitis, other site: Secondary | ICD-10-CM | POA: Diagnosis not present

## 2018-09-14 DIAGNOSIS — Z4789 Encounter for other orthopedic aftercare: Secondary | ICD-10-CM | POA: Diagnosis not present

## 2018-09-14 DIAGNOSIS — Z7984 Long term (current) use of oral hypoglycemic drugs: Secondary | ICD-10-CM | POA: Diagnosis not present

## 2018-09-14 DIAGNOSIS — E1151 Type 2 diabetes mellitus with diabetic peripheral angiopathy without gangrene: Secondary | ICD-10-CM | POA: Diagnosis not present

## 2018-09-15 DIAGNOSIS — Z7984 Long term (current) use of oral hypoglycemic drugs: Secondary | ICD-10-CM | POA: Diagnosis not present

## 2018-09-15 DIAGNOSIS — Z4789 Encounter for other orthopedic aftercare: Secondary | ICD-10-CM | POA: Diagnosis not present

## 2018-09-15 DIAGNOSIS — B9562 Methicillin resistant Staphylococcus aureus infection as the cause of diseases classified elsewhere: Secondary | ICD-10-CM | POA: Diagnosis not present

## 2018-09-15 DIAGNOSIS — E1151 Type 2 diabetes mellitus with diabetic peripheral angiopathy without gangrene: Secondary | ICD-10-CM | POA: Diagnosis not present

## 2018-09-15 DIAGNOSIS — I83893 Varicose veins of bilateral lower extremities with other complications: Secondary | ICD-10-CM | POA: Diagnosis not present

## 2018-09-15 DIAGNOSIS — M8618 Other acute osteomyelitis, other site: Secondary | ICD-10-CM | POA: Diagnosis not present

## 2018-09-16 ENCOUNTER — Encounter: Payer: Self-pay | Admitting: Podiatry

## 2018-09-16 ENCOUNTER — Ambulatory Visit (INDEPENDENT_AMBULATORY_CARE_PROVIDER_SITE_OTHER): Payer: Medicare Other | Admitting: Podiatry

## 2018-09-16 DIAGNOSIS — L97514 Non-pressure chronic ulcer of other part of right foot with necrosis of bone: Secondary | ICD-10-CM

## 2018-09-16 DIAGNOSIS — E0843 Diabetes mellitus due to underlying condition with diabetic autonomic (poly)neuropathy: Secondary | ICD-10-CM

## 2018-09-16 DIAGNOSIS — Z9889 Other specified postprocedural states: Secondary | ICD-10-CM

## 2018-09-16 NOTE — Op Note (Signed)
OPERATIVE REPORT Patient name: [name] MRN: [id number] DOB: [birth year]  DOS:  09/03/2018  Preop Dx: osteomyelitis right 3rd toe Postop Dx: same  Procedure:  1. Partial toe amputation right 3rd toe  Surgeon: [name] DPM  Anesthesia: 50-50 mixture of 2% lidocaine plain with 0.5% Marcaine plain totaling 13mL infiltrated in the patient's right lower extremity  Hemostasis: Ankle tourniquet inflated to a pressure of 264mmHg after esmarch exsanguination   EBL: 1 mL Materials: none Injectables: none Pathology: none  Condition: The patient tolerated the procedure and anesthesia well. No complications noted or reported   Justification for procedure: The patient is a 84 y.o. female recently diagnosed with diabetes mellitus who presents today for surgical correction of osteomyelitis to the right 3rd toe. All conservative modalities of been unsuccessful in providing any sort of satisfactory alleviation of symptoms with the patient. The patient was told benefits as well as possible side effects of the surgery. The patient consented for surgical correction. The patient consent form was reviewed. All patient questions were answered. No guarantees were expressed or implied. The patient and the surgeon boson the patient consent form with the witness present and placed in the patient's chart.   Procedure in Detail: The patient was brought to the operating room, placed in the operating table in the supine position at which time an aseptic scrub and drape were performed about the patient's respective lower extremity after anesthesia was induced as described above. Attention was then directed to the surgical area where procedure number one commenced.  Procedure #1: Partial toe amputation right 3rd toe  A fishmouth type incision was planned and made about the PIPJ of the third digit right foot.  Incision was carried down to the level of bone.  The distal portion of the toe was grasped with a  sharp perforating towel clamp and the toe was disarticulated at the PIPJ.  The head of the proximal phalanx was then resected away in toto.  At this time deep wound cultures were taken and sent to pathology for culture and sensitivity.  The amputation site was then copiously irrigated with normal saline and dried in preparation for routine layered soft tissue closure.  4-0 Prolene suture was utilized to reapproximate superficial skin edges for primary closure.  Dry sterile compressive dressings were then applied to all previously mentioned incision sites about the patient's lower extremity. The tourniquet which was used for hemostasis was deflated. All normal neurovascular responses including pink color and warmth returned all the digits of patient's lower extremity.  The patient was then transferred from the operating room to the recovery room having tolerated the procedure and anesthesia well. All vital signs are stable. After a brief stay in the recovery room the patient was discharged with adequate prescriptions for analgesia. Verbal as well as written instructions were provided for the patient regarding wound care. The patient is to keep the dressings clean dry and intact until they are to follow surgeon Dr. Daylene Katayama in the office upon discharge.   [name], DPM [facility]  Dr. [name], [address]                                        [place],  [address]                Office [phone]  Fax [phone]

## 2018-09-17 DIAGNOSIS — B9562 Methicillin resistant Staphylococcus aureus infection as the cause of diseases classified elsewhere: Secondary | ICD-10-CM | POA: Diagnosis not present

## 2018-09-17 DIAGNOSIS — E1151 Type 2 diabetes mellitus with diabetic peripheral angiopathy without gangrene: Secondary | ICD-10-CM | POA: Diagnosis not present

## 2018-09-17 DIAGNOSIS — M8618 Other acute osteomyelitis, other site: Secondary | ICD-10-CM | POA: Diagnosis not present

## 2018-09-17 DIAGNOSIS — Z7984 Long term (current) use of oral hypoglycemic drugs: Secondary | ICD-10-CM | POA: Diagnosis not present

## 2018-09-17 DIAGNOSIS — I83893 Varicose veins of bilateral lower extremities with other complications: Secondary | ICD-10-CM | POA: Diagnosis not present

## 2018-09-17 DIAGNOSIS — Z4789 Encounter for other orthopedic aftercare: Secondary | ICD-10-CM | POA: Diagnosis not present

## 2018-09-17 NOTE — Telephone Encounter (Signed)
[place] called to check the status on Linezolid  PA attempted for Linezolid 600 mg BID for 10 days.  PA number will not be given until approval but will take 24 hours for approval or denial.  Phone number to call [phone] Card holder ID [id number]

## 2018-09-21 ENCOUNTER — Telehealth: Payer: Self-pay

## 2018-09-21 ENCOUNTER — Ambulatory Visit (INDEPENDENT_AMBULATORY_CARE_PROVIDER_SITE_OTHER): Payer: Medicare Other | Admitting: Internal Medicine

## 2018-09-21 ENCOUNTER — Encounter: Payer: Self-pay | Admitting: Internal Medicine

## 2018-09-21 VITALS — BP 149/93 | HR 106 | Temp 97.4°F | Wt 107.0 lb

## 2018-09-21 DIAGNOSIS — Z7984 Long term (current) use of oral hypoglycemic drugs: Secondary | ICD-10-CM | POA: Diagnosis not present

## 2018-09-21 DIAGNOSIS — B9562 Methicillin resistant Staphylococcus aureus infection as the cause of diseases classified elsewhere: Secondary | ICD-10-CM | POA: Diagnosis not present

## 2018-09-21 DIAGNOSIS — M869 Osteomyelitis, unspecified: Secondary | ICD-10-CM

## 2018-09-21 DIAGNOSIS — M8618 Other acute osteomyelitis, other site: Secondary | ICD-10-CM | POA: Diagnosis not present

## 2018-09-21 DIAGNOSIS — E1151 Type 2 diabetes mellitus with diabetic peripheral angiopathy without gangrene: Secondary | ICD-10-CM | POA: Diagnosis not present

## 2018-09-21 DIAGNOSIS — I83893 Varicose veins of bilateral lower extremities with other complications: Secondary | ICD-10-CM | POA: Diagnosis not present

## 2018-09-21 DIAGNOSIS — Z4789 Encounter for other orthopedic aftercare: Secondary | ICD-10-CM | POA: Diagnosis not present

## 2018-09-21 NOTE — Progress Notes (Signed)
   Subjective:  Patient presents today status post parital 3rd toe amputation right. DOS: 09/03/18. She states she is doing well. She denies any significant pain or modifying factors. She has been applying the Gentamicin cream as directed. Patient is here for further evaluation and treatment.     Past Medical History:  Diagnosis Date  . Acute osteomyelitis ([place]) 08/25/2018   Right toe  . Anemia   . Atrial fibrillation ([place])   . Carpal tunnel syndrome, bilateral   . Cellulitis 08/25/2018   Right foot  . Cervical lymphadenopathy   . Compression fracture of fifth lumbar vertebra (HCC)    to T 11  . DDD (degenerative disc disease), lumbar   . DJD (degenerative joint disease)   . DOE (dyspnea on exertion)   . Dysphagia   . Dyspnea    with exertion  . Dysrhythmia    hx brief AF post op hip surg  . Esophageal dysmotility 01/31/2014   noted barium  . GERD (gastroesophageal reflux disease)   . Hematoma    right side after stent placement  . History of hiatal hernia 01/31/2014   Small, noted on Barium  . Hyperlipidemia   . Hypertension   . Jaundice   . Low back pain   . MRSA (methicillin resistant Staphylococcus aureus)   . Neuropathy   . Nocturia   . OA (osteoarthritis)   . Osteoporosis   . Ovarian cyst   . PAD (peripheral artery disease) ([place])   . Peripheral vascular disease ([place])   . Pleural effusion, left 04/17/2017   Small  . RA (rheumatoid arthritis) ([place])   . Spinal stenosis   . Toe ulcer, right, with necrosis of bone ([place]) 08/25/2018  . Type 2 diabetes mellitus ([place])   . Unsteady gait   . Urinary incontinence   . Uterine polyp   . Varicose vein of leg    Bilateral      Objective/Physical Exam Neurovascular status intact.  Skin incisions appear to be well coapted with sutures and staples intact. No sign of infectious process noted. No dehiscence. No active bleeding noted. Moderate edema noted to the surgical extremity.  Assessment: 1. s/p partial 3rd toe  amputation right. DOS: 09/03/18 2. T2DM with polyneuropathy    Plan of Care:  1. Patient was evaluated.  2. Sutures removed.  3. Continue using Gentamicin cream daily with a bandage.  4. Appointment with Liliane Channel for DM shoes.  5. Return to clinic in 2 weeks.     [name], DPM [facility]  Dr. [name], [place]                                        [place], [place] [address]                Office [phone]  Fax [phone]

## 2018-09-21 NOTE — Progress Notes (Signed)
RFV: follow up on mrsa osteomyelitis of foot Patient ID: [name], female   DOB: 20-Jun-1928, 84 y.o.   MRN: [id number]  HPI 84yo F who has chronic ulcers to lower extremities developed osteomyelitis of 3rd digit of right foot- no longer using abtx since having her surgery with amputation of affected area. Had repeat bone cx which were no growth to date. She has healed well though had tear of skin on lateral aspect of foot by 5th ray. Also developed a small pressure ulcer on inner aspect of 3rd digit.she had previously been using gentamicin cream. She never did approval for linezolid or tedzolid.  She is hear with family member  Outpatient Encounter Medications as of 09/21/2018  Medication Sig  . acetaminophen (TYLENOL) 325 MG tablet Take 650 mg by mouth daily.   Marland Kitchen aspirin EC 325 MG tablet Take 325 mg by mouth at bedtime.  . calcium carbonate (OSCAL) 1500 (600 Ca) MG TABS tablet Take 600 mg of elemental calcium by mouth daily with breakfast.   . docusate sodium (COLACE) 100 MG capsule Take 1 capsule (100 mg total) by mouth daily.  . ferrous sulfate 325 (65 FE) MG tablet Take 325 mg by mouth daily with breakfast.  . gentamicin cream (GARAMYCIN) 0.1 % Apply 1 application topically 3 (three) times daily. (Patient taking differently: Apply 1 application topically daily. Applied to affected area of toe)  . linagliptin (TRADJENTA) 5 MG TABS tablet Take 5 mg by mouth daily.  . metFORMIN (GLUCOPHAGE) 1000 MG tablet Take 0.5 tablets (500 mg total) by mouth 2 (two) times daily.  . metoprolol succinate (TOPROL-XL) 25 MG 24 hr tablet Take 25 mg by mouth at bedtime.   . pantoprazole (PROTONIX) 40 MG tablet Take 1 tablet (40 mg total) by mouth daily. (Patient taking differently: Take 40 mg by mouth every evening. )  . vitamin B-12 (CYANOCOBALAMIN) 1000 MCG tablet Take 1,000 mcg by mouth daily.  . Vitamin D, Ergocalciferol, (DRISDOL) 50000 units CAPS capsule Take 50,000 Units by mouth every Sunday.  Sundays   . warfarin (COUMADIN) 2 MG tablet Take 1-2 mg by mouth See admin instructions. Take 0.5 tablet (1 mg) by mouth on Thursday evenings & take 1 tablet (2 mg) by mouth on all other nights.  . doxycycline (VIBRA-TABS) 100 MG tablet Take 1 tablet (100 mg total) by mouth 2 (two) times daily. (Patient not taking: Reported on 09/21/2018)   No facility-administered encounter medications on file as of 09/21/2018.      Patient Active Problem List   Diagnosis Date Noted  . Degeneration of lumbar intervertebral disc 12/11/2017  . Cough   . Hematoma   . Groin swelling 04/12/2017  . Hematoma of groin 04/12/2017  . Critical lower limb ischemia 04/08/2017  . Long term (current) use of anticoagulants [Z79.01] 12/13/2015  . Closed dislocation of tarsometatarsal joint 10/05/2015  . ULCER OF OTHER PART OF FOOT 06/06/2010  . ESSENTIAL HYPERTENSION 03/26/2010  . ATRIAL FIBRILLATION 03/26/2010  . CUTANEOUS ERUPTIONS, DRUG-INDUCED 03/26/2010  . DIARRHEA 03/26/2010  . GERD 03/20/2010  . UNSPEC LOCAL INFECTION SKIN&SUBCUTANEOUS TISSUE 03/20/2010  . OSTEOARTHRITIS 03/20/2010  . ARTHRITIS, SEPTIC 03/14/2010  . OTH COMPLICATIONS DUE INTERNAL JOINT PROSTHESIS 02/22/2010     Health Maintenance Due  Topic Date Due  . FOOT EXAM  08/10/1940  . OPHTHALMOLOGY EXAM  08/10/1940  . URINE MICROALBUMIN  08/10/1940  . DEXA SCAN  08/11/1995  . PNA vac Low Risk Adult (1 of 2 - PCV13) 08/11/1995  .  INFLUENZA VACCINE  06/18/2018     Review of Systems Review of Systems  Constitutional: Negative for fever, chills, diaphoresis, activity change, appetite change, fatigue and unexpected weight change.  HENT: Negative for congestion, sore throat, rhinorrhea, sneezing, trouble swallowing and sinus pressure.  Eyes: Negative for photophobia and visual disturbance.  Respiratory: Negative for cough, chest tightness, shortness of breath, wheezing and stridor.  Cardiovascular: Negative for chest pain, palpitations and leg  swelling.  Gastrointestinal: Negative for nausea, vomiting, abdominal pain, diarrhea, constipation, blood in stool, abdominal distention and anal bleeding.  Genitourinary: Negative for dysuria, hematuria, flank pain and difficulty urinating.  Musculoskeletal: Negative for myalgias, back pain, joint swelling, arthralgias and gait problem.  Skin: Negative for color change, pallor, rash and wound.  Neurological: Negative for dizziness, tremors, weakness and light-headedness.  Hematological: Negative for adenopathy. Does not bruise/bleed easily.  Psychiatric/Behavioral: Negative for behavioral problems, confusion, sleep disturbance, dysphoric mood, decreased concentration and agitation.    Physical Exam   BP (!) 149/93   Pulse (!) 106   Temp (!) 97.4 F (36.3 C)   Wt 107 lb (48.5 kg)   BMI 18.95 kg/m   Physical Exam  Constitutional:  oriented to person, place, and time. appears well-developed and well-nourished. No distress.  HENT: [place]/AT, PERRLA, no scleral icterus Mouth/Throat: Oropharynx is clear and moist. No oropharyngeal exudate.  Cardiovascular: Normal rate, regular rhythm and normal heart sounds. Exam reveals no gallop and no friction rub.  No murmur heard.  Pulmonary/Chest: Effort normal and breath sounds normal. No respiratory distress.  has no wheezes.  Neck = supple, no nuchal rigidity Abdominal: Soft. Bowel sounds are normal.  exhibits no distension. There is no tenderness.  Lymphadenopathy: no cervical adenopathy. No axillary adenopathy Neurological: alert and oriented to person, place, and time.  Skin: Skin is warm and dry. No rash noted. No erythema. Right foot some small aptch of eschar to lateral foot. Amputation to 3rd digits. 2nd is missing Psychiatric: a normal mood and affect.  behavior is normal.    CBC Lab Results  Component Value Date   WBC 8.5 09/01/2018   RBC 4.95 09/01/2018   HGB 15.8 (H) 09/01/2018   HCT 48.8 (H) 09/01/2018   PLT 202 09/01/2018   MCV  98.6 09/01/2018   MCH 31.9 09/01/2018   MCHC 32.4 09/01/2018   RDW 13.3 09/01/2018   LYMPHSABS 1,531 08/31/2018   MONOABS 1.1 (H) 06/20/2018   EOSABS 243 08/31/2018    BMET Lab Results  Component Value Date   NA 139 09/01/2018   K 5.0 09/01/2018   CL 104 09/01/2018   CO2 27 09/01/2018   GLUCOSE 195 (H) 09/01/2018   BUN 26 (H) 09/01/2018   CREATININE 0.98 09/01/2018   CALCIUM 9.4 09/01/2018   GFRNONAA 50 (L) 09/01/2018   GFRAA 58 (L) 09/01/2018      Assessment and Plan  Hx of mrsa osteomyelitis of foot= since repeat bone cx in the ED was negative. No need for further abtx. And will watch off of abtx No longer  needs abtx

## 2018-09-21 NOTE — Telephone Encounter (Signed)
Received a fax update from [place] office that patients PA for Linezolid was denied. Appeal was faxed today to [phone]. Appeal will take up to 72 hours before a decision is made. Will update Dr. Baxter Flattery of PA status. [place]

## 2018-09-22 DIAGNOSIS — M8618 Other acute osteomyelitis, other site: Secondary | ICD-10-CM | POA: Diagnosis not present

## 2018-09-22 DIAGNOSIS — B9562 Methicillin resistant Staphylococcus aureus infection as the cause of diseases classified elsewhere: Secondary | ICD-10-CM | POA: Diagnosis not present

## 2018-09-22 DIAGNOSIS — I83893 Varicose veins of bilateral lower extremities with other complications: Secondary | ICD-10-CM | POA: Diagnosis not present

## 2018-09-22 DIAGNOSIS — Z7984 Long term (current) use of oral hypoglycemic drugs: Secondary | ICD-10-CM | POA: Diagnosis not present

## 2018-09-22 DIAGNOSIS — Z4789 Encounter for other orthopedic aftercare: Secondary | ICD-10-CM | POA: Diagnosis not present

## 2018-09-22 DIAGNOSIS — E1151 Type 2 diabetes mellitus with diabetic peripheral angiopathy without gangrene: Secondary | ICD-10-CM | POA: Diagnosis not present

## 2018-09-24 DIAGNOSIS — Z7984 Long term (current) use of oral hypoglycemic drugs: Secondary | ICD-10-CM | POA: Diagnosis not present

## 2018-09-24 DIAGNOSIS — B9562 Methicillin resistant Staphylococcus aureus infection as the cause of diseases classified elsewhere: Secondary | ICD-10-CM | POA: Diagnosis not present

## 2018-09-24 DIAGNOSIS — I83893 Varicose veins of bilateral lower extremities with other complications: Secondary | ICD-10-CM | POA: Diagnosis not present

## 2018-09-24 DIAGNOSIS — Z4789 Encounter for other orthopedic aftercare: Secondary | ICD-10-CM | POA: Diagnosis not present

## 2018-09-24 DIAGNOSIS — E1151 Type 2 diabetes mellitus with diabetic peripheral angiopathy without gangrene: Secondary | ICD-10-CM | POA: Diagnosis not present

## 2018-09-24 DIAGNOSIS — M8618 Other acute osteomyelitis, other site: Secondary | ICD-10-CM | POA: Diagnosis not present

## 2018-09-28 ENCOUNTER — Ambulatory Visit (INDEPENDENT_AMBULATORY_CARE_PROVIDER_SITE_OTHER): Payer: Medicare Other | Admitting: Podiatry

## 2018-09-28 ENCOUNTER — Ambulatory Visit: Payer: Medicare Other | Admitting: Orthotics

## 2018-09-28 DIAGNOSIS — L97512 Non-pressure chronic ulcer of other part of right foot with fat layer exposed: Secondary | ICD-10-CM | POA: Diagnosis not present

## 2018-09-28 DIAGNOSIS — E0843 Diabetes mellitus due to underlying condition with diabetic autonomic (poly)neuropathy: Secondary | ICD-10-CM

## 2018-09-28 DIAGNOSIS — M86171 Other acute osteomyelitis, right ankle and foot: Secondary | ICD-10-CM

## 2018-09-28 DIAGNOSIS — Z9889 Other specified postprocedural states: Secondary | ICD-10-CM

## 2018-09-28 NOTE — Progress Notes (Signed)

## 2018-09-29 NOTE — Progress Notes (Signed)
Subjective:  Patient presents today status post parital 3rd toe amputation right. DOS: 09/03/18. She states she is doing well. She denies any complaints or pain regarding her surgery at this time. She denies any modifying factors.  She has a new complaint of a wound to the right 5th MPJ that appeared about one week ago. She has been applying Betadine to the area for treatment. Touching the area causes some tenderness. Patient is here for further evaluation and treatment.     Past Medical History:  Diagnosis Date  . Acute osteomyelitis ([place]) 08/25/2018   Right toe  . Anemia   . Atrial fibrillation ([place])   . Carpal tunnel syndrome, bilateral   . Cellulitis 08/25/2018   Right foot  . Cervical lymphadenopathy   . Compression fracture of fifth lumbar vertebra (HCC)    to T 11  . DDD (degenerative disc disease), lumbar   . DJD (degenerative joint disease)   . DOE (dyspnea on exertion)   . Dysphagia   . Dyspnea    with exertion  . Dysrhythmia    hx brief AF post op hip surg  . Esophageal dysmotility 01/31/2014   noted barium  . GERD (gastroesophageal reflux disease)   . Hematoma    right side after stent placement  . History of hiatal hernia 01/31/2014   Small, noted on Barium  . Hyperlipidemia   . Hypertension   . Jaundice   . Low back pain   . MRSA (methicillin resistant Staphylococcus aureus)   . Neuropathy   . Nocturia   . OA (osteoarthritis)   . Osteoporosis   . Ovarian cyst   . PAD (peripheral artery disease) ([place])   . Peripheral vascular disease ([place])   . Pleural effusion, left 04/17/2017   Small  . RA (rheumatoid arthritis) ([place])   . Spinal stenosis   . Toe ulcer, right, with necrosis of bone ([place]) 08/25/2018  . Type 2 diabetes mellitus ([place])   . Unsteady gait   . Urinary incontinence   . Uterine polyp   . Varicose vein of leg    Bilateral      Objective/Physical Exam Neurovascular status intact.  Skin incisions appear to be healed. No sign of  infectious process noted. No active bleeding noted. Moderate edema noted to the surgical extremity.  Wound #1 noted to the right 5th MPJ measuring 1.0 x 1.0 x 0.1 cm.   To the above-noted ulceration, there is no eschar. There is a moderate amount of slough, fibrin and necrotic tissue. Granulation tissue and wound base is red. There is no malodor. There is a minimal amount of serosanginous drainage noted. Periwound integrity is intact.   Assessment: 1. s/p partial 3rd toe amputation right. DOS: 09/03/18 - healed 2. T2DM with polyneuropathy  3. Ulceration of the right 5th MPJ secondary to diabetes mellitus    Plan of Care:  1. Patient was evaluated.  2. Medically necessary excisional debridement including subcutaneous tissue was performed using a tissue nipper and a chisel blade. Excisional debridement of all the necrotic nonviable tissue down to healthy bleeding viable tissue was performed with post-debridement measurements same as pre-. 3. The wound was cleansed and dry sterile dressing applied. 4. Continue using Betadine and dry sterile dressing and offloading pads daily.  5. Appointment with Liliane Channel today for new DM shoes and insoles.  6. Return to clinic in 2 weeks.    [name], DPM [facility]  Dr. [name], DPM  [address]                                        [place], [place] [address]                Office [phone]  Fax [phone]

## 2018-10-01 DIAGNOSIS — I480 Paroxysmal atrial fibrillation: Secondary | ICD-10-CM | POA: Diagnosis not present

## 2018-10-01 DIAGNOSIS — I7389 Other specified peripheral vascular diseases: Secondary | ICD-10-CM | POA: Diagnosis not present

## 2018-10-01 DIAGNOSIS — Z6821 Body mass index (BMI) 21.0-21.9, adult: Secondary | ICD-10-CM | POA: Diagnosis not present

## 2018-10-01 DIAGNOSIS — I10 Essential (primary) hypertension: Secondary | ICD-10-CM | POA: Diagnosis not present

## 2018-10-01 DIAGNOSIS — E1151 Type 2 diabetes mellitus with diabetic peripheral angiopathy without gangrene: Secondary | ICD-10-CM | POA: Diagnosis not present

## 2018-10-01 DIAGNOSIS — Z7901 Long term (current) use of anticoagulants: Secondary | ICD-10-CM | POA: Diagnosis not present

## 2018-10-01 DIAGNOSIS — M810 Age-related osteoporosis without current pathological fracture: Secondary | ICD-10-CM | POA: Diagnosis not present

## 2018-10-07 DIAGNOSIS — Z7984 Long term (current) use of oral hypoglycemic drugs: Secondary | ICD-10-CM | POA: Diagnosis not present

## 2018-10-07 DIAGNOSIS — B9562 Methicillin resistant Staphylococcus aureus infection as the cause of diseases classified elsewhere: Secondary | ICD-10-CM | POA: Diagnosis not present

## 2018-10-07 DIAGNOSIS — I83893 Varicose veins of bilateral lower extremities with other complications: Secondary | ICD-10-CM | POA: Diagnosis not present

## 2018-10-07 DIAGNOSIS — Z4789 Encounter for other orthopedic aftercare: Secondary | ICD-10-CM | POA: Diagnosis not present

## 2018-10-07 DIAGNOSIS — M8618 Other acute osteomyelitis, other site: Secondary | ICD-10-CM | POA: Diagnosis not present

## 2018-10-07 DIAGNOSIS — E1151 Type 2 diabetes mellitus with diabetic peripheral angiopathy without gangrene: Secondary | ICD-10-CM | POA: Diagnosis not present

## 2018-10-12 ENCOUNTER — Ambulatory Visit (INDEPENDENT_AMBULATORY_CARE_PROVIDER_SITE_OTHER): Payer: Medicare Other | Admitting: Podiatry

## 2018-10-12 DIAGNOSIS — I70235 Atherosclerosis of native arteries of right leg with ulceration of other part of foot: Secondary | ICD-10-CM

## 2018-10-12 DIAGNOSIS — Z9889 Other specified postprocedural states: Secondary | ICD-10-CM

## 2018-10-12 DIAGNOSIS — E0843 Diabetes mellitus due to underlying condition with diabetic autonomic (poly)neuropathy: Secondary | ICD-10-CM | POA: Diagnosis not present

## 2018-10-12 DIAGNOSIS — L97512 Non-pressure chronic ulcer of other part of right foot with fat layer exposed: Secondary | ICD-10-CM | POA: Diagnosis not present

## 2018-10-13 NOTE — Progress Notes (Signed)
Subjective:  Patient presents today status post parital 3rd toe amputation right. DOS: 09/03/18. She reports some pain of the right foot. She states it has improved in the last few days but is still present. She denies any modifying factors. She has been applying Gentamicin cream to the ulceration of the right 5th MPJ as directed. Patient is here for further evaluation and treatment.     Past Medical History:  Diagnosis Date  . Acute osteomyelitis ([place]) 08/25/2018   Right toe  . Anemia   . Atrial fibrillation ([place])   . Carpal tunnel syndrome, bilateral   . Cellulitis 08/25/2018   Right foot  . Cervical lymphadenopathy   . Compression fracture of fifth lumbar vertebra (HCC)    to T 11  . DDD (degenerative disc disease), lumbar   . DJD (degenerative joint disease)   . DOE (dyspnea on exertion)   . Dysphagia   . Dyspnea    with exertion  . Dysrhythmia    hx brief AF post op hip surg  . Esophageal dysmotility 01/31/2014   noted barium  . GERD (gastroesophageal reflux disease)   . Hematoma    right side after stent placement  . History of hiatal hernia 01/31/2014   Small, noted on Barium  . Hyperlipidemia   . Hypertension   . Jaundice   . Low back pain   . MRSA (methicillin resistant Staphylococcus aureus)   . Neuropathy   . Nocturia   . OA (osteoarthritis)   . Osteoporosis   . Ovarian cyst   . PAD (peripheral artery disease) ([place])   . Peripheral vascular disease ([place])   . Pleural effusion, left 04/17/2017   Small  . RA (rheumatoid arthritis) ([place])   . Spinal stenosis   . Toe ulcer, right, with necrosis of bone ([place]) 08/25/2018  . Type 2 diabetes mellitus ([place])   . Unsteady gait   . Urinary incontinence   . Uterine polyp   . Varicose vein of leg    Bilateral      Objective/Physical Exam Neurovascular status intact.  Skin incisions appear to be healed. No sign of infectious process noted. No active bleeding noted. Moderate edema noted to the surgical  extremity.  Wound #1 noted to the right 5th MPJ measuring 0.5 x 0.5 x 0.1 cm.   To the above-noted ulceration, there is no eschar. There is a moderate amount of slough, fibrin and necrotic tissue. Granulation tissue and wound base is red. There is no malodor. There is a minimal amount of serosanginous drainage noted. Periwound integrity is intact.   Assessment: 1. s/p partial 3rd toe amputation right. DOS: 09/03/18 - healed 2. T2DM with polyneuropathy  3. Ulceration of the right 5th MPJ secondary to diabetes mellitus    Plan of Care:  1. Patient was evaluated.  2. Medically necessary excisional debridement including subcutaneous tissue was performed using a tissue nipper and a chisel blade. Excisional debridement of all the necrotic nonviable tissue down to healthy bleeding viable tissue was performed with post-debridement measurements same as pre-. 3. The wound was cleansed and dry sterile dressing applied. 4. Continue using Gentamicin cream and a bandage daily.  5. Patient is waiting for DM shoes to come into the office.   6. Return to clinic in 3 weeks.    [name], DPM [facility]  Dr. [name], DPM    [address] [place]  [place], [place] [address]                Office [phone]  Fax [phone]

## 2018-10-28 DIAGNOSIS — Z7901 Long term (current) use of anticoagulants: Secondary | ICD-10-CM | POA: Diagnosis not present

## 2018-10-30 NOTE — Progress Notes (Signed)
DOS  09/03/2018  Partial toe amputation third right foot.

## 2018-11-02 ENCOUNTER — Ambulatory Visit: Payer: Medicare Other | Admitting: Podiatry

## 2018-11-02 ENCOUNTER — Ambulatory Visit: Payer: Medicare Other | Admitting: Orthotics

## 2018-11-03 ENCOUNTER — Ambulatory Visit (INDEPENDENT_AMBULATORY_CARE_PROVIDER_SITE_OTHER): Payer: Medicare Other | Admitting: Orthotics

## 2018-11-03 DIAGNOSIS — L03031 Cellulitis of right toe: Secondary | ICD-10-CM | POA: Diagnosis not present

## 2018-11-03 DIAGNOSIS — L02611 Cutaneous abscess of right foot: Secondary | ICD-10-CM

## 2018-11-03 DIAGNOSIS — E0843 Diabetes mellitus due to underlying condition with diabetic autonomic (poly)neuropathy: Secondary | ICD-10-CM | POA: Diagnosis not present

## 2018-11-03 DIAGNOSIS — L97512 Non-pressure chronic ulcer of other part of right foot with fat layer exposed: Secondary | ICD-10-CM

## 2018-11-03 DIAGNOSIS — Z9889 Other specified postprocedural states: Secondary | ICD-10-CM

## 2018-11-03 NOTE — Progress Notes (Signed)

## 2018-11-04 ENCOUNTER — Ambulatory Visit: Payer: Medicare Other | Admitting: Internal Medicine

## 2018-11-04 ENCOUNTER — Ambulatory Visit (INDEPENDENT_AMBULATORY_CARE_PROVIDER_SITE_OTHER): Payer: Medicare Other | Admitting: Podiatry

## 2018-11-04 DIAGNOSIS — Z9889 Other specified postprocedural states: Secondary | ICD-10-CM

## 2018-11-04 DIAGNOSIS — E0843 Diabetes mellitus due to underlying condition with diabetic autonomic (poly)neuropathy: Secondary | ICD-10-CM

## 2018-11-06 NOTE — Progress Notes (Signed)
   Subjective:  Patient presents today status post parital 3rd toe amputation right. DOS: 09/03/18. She is here for follow up evaluation of an ulceration of the right 5th MPJ. She states the wound has improved significantly and looks a lot better. She has been using the Gentamicin cream as directed. She denies modifying factors. Patient is here for further evaluation and treatment.     Past Medical History:  Diagnosis Date  . Acute osteomyelitis ([place]) 08/25/2018   Right toe  . Anemia   . Atrial fibrillation ([place])   . Carpal tunnel syndrome, bilateral   . Cellulitis 08/25/2018   Right foot  . Cervical lymphadenopathy   . Compression fracture of fifth lumbar vertebra (HCC)    to T 11  . DDD (degenerative disc disease), lumbar   . DJD (degenerative joint disease)   . DOE (dyspnea on exertion)   . Dysphagia   . Dyspnea    with exertion  . Dysrhythmia    hx brief AF post op hip surg  . Esophageal dysmotility 01/31/2014   noted barium  . GERD (gastroesophageal reflux disease)   . Hematoma    right side after stent placement  . History of hiatal hernia 01/31/2014   Small, noted on Barium  . Hyperlipidemia   . Hypertension   . Jaundice   . Low back pain   . MRSA (methicillin resistant Staphylococcus aureus)   . Neuropathy   . Nocturia   . OA (osteoarthritis)   . Osteoporosis   . Ovarian cyst   . PAD (peripheral artery disease) ([place])   . Peripheral vascular disease ([place])   . Pleural effusion, left 04/17/2017   Small  . RA (rheumatoid arthritis) ([place])   . Spinal stenosis   . Toe ulcer, right, with necrosis of bone ([place]) 08/25/2018  . Type 2 diabetes mellitus ([place])   . Unsteady gait   . Urinary incontinence   . Uterine polyp   . Varicose vein of leg    Bilateral      Objective/Physical Exam Neurovascular status intact.  Skin incisions appear to be healed. No sign of infectious process noted. No active bleeding noted. Moderate edema noted to the surgical  extremity.  Wound noted to the right 5th MPJ has healed. Complete re-epithelialization has occurred. No drainage noted.    Assessment: 1. s/p partial 3rd toe amputation right. DOS: 09/03/18 - healed 2. T2DM with polyneuropathy  3. Ulceration of the right 5th MPJ secondary to diabetes mellitus - healed    Plan of Care:  1. Patient was evaluated.  2. Continue using Gentamicin cream daily with a bandage for two weeks.  3. Continue wearing DM shoes. 4. Return to clinic in 8 weeks for routine care.    [name], DPM [facility]  Dr. [name], [place]                                        [place], [place] [address]                Office [phone]  Fax [phone]

## 2018-12-30 ENCOUNTER — Ambulatory Visit (INDEPENDENT_AMBULATORY_CARE_PROVIDER_SITE_OTHER): Payer: Medicare Other | Admitting: Podiatry

## 2018-12-30 ENCOUNTER — Encounter: Payer: Self-pay | Admitting: Podiatry

## 2018-12-30 DIAGNOSIS — B351 Tinea unguium: Secondary | ICD-10-CM

## 2018-12-30 DIAGNOSIS — M79676 Pain in unspecified toe(s): Secondary | ICD-10-CM | POA: Diagnosis not present

## 2018-12-30 DIAGNOSIS — E0843 Diabetes mellitus due to underlying condition with diabetic autonomic (poly)neuropathy: Secondary | ICD-10-CM

## 2018-12-30 DIAGNOSIS — L989 Disorder of the skin and subcutaneous tissue, unspecified: Secondary | ICD-10-CM

## 2019-01-04 NOTE — Progress Notes (Signed)
  Subjective: Patient is a 85 y.o. female presenting to the office today for follow up evaluation of painful callus lesions to the bilateral feet. Walking increases the pain from the calluses. She has not done anything at home for treatment. Patient also complains of elongated, thickened nails that cause pain while ambulating in shoes. She is unable to trim her own nails. Patient presents today for further treatment and evaluation.  Past Medical History:  Diagnosis Date  . Acute osteomyelitis (HCC) 08/25/2018   Right toe  . Anemia   . Atrial fibrillation (HCC)   . Carpal tunnel syndrome, bilateral   . Cellulitis 08/25/2018   Right foot  . Cervical lymphadenopathy   . Compression fracture of fifth lumbar vertebra (HCC)    to T 11  . DDD (degenerative disc disease), lumbar   . DJD (degenerative joint disease)   . DOE (dyspnea on exertion)   . Dysphagia   . Dyspnea    with exertion  . Dysrhythmia    hx brief AF post op hip surg  . Esophageal dysmotility 01/31/2014   noted barium  . GERD (gastroesophageal reflux disease)   . Hematoma    right side after stent placement  . History of hiatal hernia 01/31/2014   Small, noted on Barium  . Hyperlipidemia   . Hypertension   . Jaundice   . Low back pain   . MRSA (methicillin resistant Staphylococcus aureus)   . Neuropathy   . Nocturia   . OA (osteoarthritis)   . Osteoporosis   . Ovarian cyst   . PAD (peripheral artery disease) (HCC)   . Peripheral vascular disease (HCC)   . Pleural effusion, left 04/17/2017   Small  . RA (rheumatoid arthritis) (HCC)   . Spinal stenosis   . Toe ulcer, right, with necrosis of bone (HCC) 08/25/2018  . Type 2 diabetes mellitus (HCC)   . Unsteady gait   . Urinary incontinence   . Uterine polyp   . Varicose vein of leg    Bilateral    Objective:  Physical Exam General: Alert and oriented x3 in no acute distress  Dermatology: Hyperkeratotic lesions present on the bilateral feet x 4. Pain  on palpation with a central nucleated core noted. Skin is warm, dry and supple bilateral lower extremities. Negative for open lesions or macerations. Nails are tender, long, thickened and dystrophic with subungual debris, consistent with onychomycosis, 1-5 bilateral. No signs of infection noted.  Vascular: Palpable pedal pulses bilaterally. No edema or erythema noted. Capillary refill within normal limits.  Neurological: Epicritic and protective threshold diminished bilaterally.   Musculoskeletal Exam: Pain on palpation at the keratotic lesion noted. Range of motion within normal limits bilateral. Muscle strength 5/5 in all groups bilateral.  Assessment: 1. Onychodystrophic nail bilaterally with hyperkeratosis of nails.  2. Onychomycosis of nail due to dermatophyte bilateral 3. Pre-ulcerative callus lesions noted to the bilateral feet x 4 4. H/o toe amputations bilateral    Plan of Care:  1. Patient evaluated. 2. Excisional debridement of keratoic lesions using a chisel blade was performed without incident.  3. Dressed with light dressing. 4. Mechanical debridement of nails bilaterally performed using a nail nipper. Filed with dremel without incident.  5. Continue wearing DM shoes.  6. Patient is to return to the clinic in 3 months.   Brent M. Evans, DPM [facility]  Dr. Brent M. Evans, DPM    [address]                                          [place], [place] [address]                Office [phone]  Fax [phone]   

## 2019-01-05 ENCOUNTER — Other Ambulatory Visit: Payer: Self-pay | Admitting: Interventional Radiology

## 2019-01-05 DIAGNOSIS — I739 Peripheral vascular disease, unspecified: Secondary | ICD-10-CM

## 2019-01-11 DIAGNOSIS — I10 Essential (primary) hypertension: Secondary | ICD-10-CM | POA: Diagnosis not present

## 2019-01-11 DIAGNOSIS — E1151 Type 2 diabetes mellitus with diabetic peripheral angiopathy without gangrene: Secondary | ICD-10-CM | POA: Diagnosis not present

## 2019-01-11 DIAGNOSIS — R82998 Other abnormal findings in urine: Secondary | ICD-10-CM | POA: Diagnosis not present

## 2019-01-11 DIAGNOSIS — E7849 Other hyperlipidemia: Secondary | ICD-10-CM | POA: Diagnosis not present

## 2019-01-11 DIAGNOSIS — M810 Age-related osteoporosis without current pathological fracture: Secondary | ICD-10-CM | POA: Diagnosis not present

## 2019-01-13 ENCOUNTER — Encounter: Payer: Self-pay | Admitting: *Deleted

## 2019-01-13 ENCOUNTER — Ambulatory Visit
Admission: RE | Admit: 2019-01-13 | Discharge: 2019-01-13 | Disposition: A | Discharge status: Home / Self Care | Payer: Medicare Other | Source: Ambulatory Visit | Attending: Interventional Radiology | Admitting: Interventional Radiology

## 2019-01-13 DIAGNOSIS — I739 Peripheral vascular disease, unspecified: Secondary | ICD-10-CM

## 2019-01-13 HISTORY — PX: IR RADIOLOGIST EVAL & MGMT: IMG5224

## 2019-01-13 NOTE — Progress Notes (Signed)
Chief Complaint: PAD  Referring Physician(s): [name], [facility]  History of Present Illness: AARIEL EMS is a 85 y.o. female presenting as a scheduled follow up to Vascular & Interventional Radiology, with a history of left CLI, SP treatment of left fem-pop occlusion 04/08/2017.    After the initial treatment she had acute occlusion, which was treated with lysis the following day.  Then she had a right hip hematoma occur 2-3 days after her DC, which again required a surgical evacuation.    Since continues to see [name] for foot care.  Most recently she required a right 3rd toe amputation for osteo.  She continues to heal well from her surgery sites.    Currently she has no new wound, with healed surgical sites of both feet.    She has a strong doppler signal at both PT and DP.  No intertriginous wounds.   She continues with maximal medical therapy. .    Today she has had a non-invasive exam, which shows non-compressible vessels at the ankles.  I believe she has distal fem-pop disease of the bilateral legs, and tibial disease.  Although the tibial arteries are patent, they are compromised.   Past Medical History:  Diagnosis Date  . Acute osteomyelitis ([place]) 08/25/2018   Right toe  . Anemia   . Atrial fibrillation ([place])   . Carpal tunnel syndrome, bilateral   . Cellulitis 08/25/2018   Right foot  . Cervical lymphadenopathy   . Compression fracture of fifth lumbar vertebra (HCC)    to T 11  . DDD (degenerative disc disease), lumbar   . DJD (degenerative joint disease)   . DOE (dyspnea on exertion)   . Dysphagia   . Dyspnea    with exertion  . Dysrhythmia    hx brief AF post op hip surg  . Esophageal dysmotility 01/31/2014   noted barium  . GERD (gastroesophageal reflux disease)   . Hematoma    right side after stent placement  . History of hiatal hernia 01/31/2014   Small, noted on Barium  . Hyperlipidemia   . Hypertension   . Jaundice   .  Low back pain   . MRSA (methicillin resistant Staphylococcus aureus)   . Neuropathy   . Nocturia   . OA (osteoarthritis)   . Osteoporosis   . Ovarian cyst   . PAD (peripheral artery disease) ([place])   . Peripheral vascular disease ([place])   . Pleural effusion, left 04/17/2017   Small  . RA (rheumatoid arthritis) ([place])   . Spinal stenosis   . Toe ulcer, right, with necrosis of bone ([place]) 08/25/2018  . Type 2 diabetes mellitus ([place])   . Unsteady gait   . Urinary incontinence   . Uterine polyp   . Varicose vein of leg    Bilateral    Past Surgical History:  Procedure Laterality Date  . AMPUTATION Right 02/04/2013   Procedure: [name] ;  Surgeon: Wylene Simmer, MD;  Location: [facility];  Service: Orthopedics;  Laterality: Right;  . AMPUTATION TOE Right 09/03/2018   Procedure: AMPUTATION 3RD RIGHT TOE INTERPHALANGEAL;  Surgeon: [name], DPM;  Location: [facility];  Service: Podiatry;  Laterality: Right;  . APPENDECTOMY    . COLONOSCOPY    . EYE SURGERY     both cataracts  . FEMORAL ARTERY EXPLORATION Right 04/12/2017   Procedure: EVACUATION HEMATOMA RIGHT GROIN WITH FEMORAL ARTERY REPAIR;  Surgeon: Curt Jews  F, MD;  Location: [facility];  Service: Vascular;  Laterality: Right;  . INCISION AND DRAINAGE  2011   left hip inf-hemovac  . IR ANGIOGRAM EXTREMITY LEFT  03/25/2017  . IR ANGIOGRAM EXTREMITY LEFT  04/08/2017  . IR ANGIOGRAM SELECTIVE EACH ADDITIONAL VESSEL  03/25/2017  . IR FEM POP ART STENT INC PTA MOD SED  03/25/2017  . IR INFUSION THROMBOL ARTERIAL INITIAL (MS)  04/08/2017  . IR RADIOLOGIST EVAL & MGMT  02/26/2017  . IR RADIOLOGIST EVAL & MGMT  04/03/2017  . IR RADIOLOGIST EVAL & MGMT  08/20/2017  . IR RADIOLOGIST EVAL & MGMT  01/22/2018  . IR THROMB F/U EVAL ART/VEN FINAL DAY (MS)  04/09/2017  . IR TIB-PERO ART PTA MOD SED  03/25/2017  . IR US GUIDE VASC ACCESS RIGHT  03/25/2017  . IR US GUIDE VASC ACCESS RIGHT  04/08/2017  . KNEE ARTHROSCOPY  1995   left    . NECK MASS EXCISION    . TONSILLECTOMY    . TOTAL HIP ARTHROPLASTY  2006   left-fx  . TOTAL KNEE ARTHROPLASTY  [id number]   left  . TOTAL KNEE ARTHROPLASTY  2009   right    Allergies: Amprenavir; Bactrim [sulfamethoxazole-trimethoprim]; Ceftriaxone sodium; Phenergan [promethazine hcl]; Sulfonamide derivatives; and Vancomycin hcl  Medications: Prior to Admission medications   Medication Sig Start Date End Date Taking? Authorizing Provider  acetaminophen (TYLENOL) 325 MG tablet Take 650 mg by mouth daily.     [provider]  aspirin EC 325 MG tablet Take 325 mg by mouth at bedtime.    [provider]  calcium carbonate (OSCAL) 1500 (600 Ca) MG TABS tablet Take 600 mg of elemental calcium by mouth daily with breakfast.     [provider]  docusate sodium (COLACE) 100 MG capsule Take 1 capsule (100 mg total) by mouth daily. 04/17/17   Reyne Dumas, MD  doxycycline (VIBRA-TABS) 100 MG tablet Take 1 tablet (100 mg total) by mouth 2 (two) times daily. 08/24/18   [name], DPM  ergocalciferol (VITAMIN D2) 1.25 MG (50000 UT) capsule ergocalciferol (vitamin D2) 1,250 mcg (50,000 unit) capsule  TK 1 C PO 1 TIME WEEKLY    [provider]  ferrous sulfate 325 (65 FE) MG tablet Take 325 mg by mouth daily with breakfast.    [provider]  gentamicin cream (GARAMYCIN) 0.1 % Apply 1 application topically 3 (three) times daily. Patient taking differently: Apply 1 application topically daily. Applied to affected area of toe 08/25/18   Landis Martins, DPM  linagliptin (TRADJENTA) 5 MG TABS tablet Take 5 mg by mouth daily.    [provider]  metFORMIN (GLUCOPHAGE) 1000 MG tablet Take 0.5 tablets (500 mg total) by mouth 2 (two) times daily. 06/20/18   [place]ur[place], [place], PA-C  metoprolol succinate (TOPROL-XL) 25 MG 24 hr tablet Take 25 mg by mouth at bedtime.     [provider]  pantoprazole (PROTONIX) 40 MG tablet Take 1 tablet (40 mg  total) by mouth daily. Patient taking differently: Take 40 mg by mouth every evening.  04/17/17   Reyne Dumas, MD  vitamin B-12 (CYANOCOBALAMIN) 1000 MCG tablet Take 1,000 mcg by mouth daily.    [provider]  Vitamin D, Ergocalciferol, (DRISDOL) 50000 units CAPS capsule Take 50,000 Units by mouth every Sunday. Sundays     [provider]  warfarin (COUMADIN) 2 MG tablet Take 1-2 mg by mouth See admin instructions. Take 0.5 tablet (1 mg) by  mouth on Thursday evenings & take 1 tablet (2 mg) by mouth on all other nights.    [provider]     Family History  Problem Relation Age of Onset  . Cancer Mother     Social History   Socioeconomic History  . Marital status: Married    Spouse name: Not on file  . Number of children: Not on file  . Years of education: Not on file  . Highest education level: Not on file  Occupational History  . Not on file  Social Needs  . Financial resource strain: Not on file  . Food insecurity:    Worry: Not on file    Inability: Not on file  . Transportation needs:    Medical: Not on file    Non-medical: Not on file  Tobacco Use  . Smoking status: Never Smoker  . Smokeless tobacco: Never Used  Substance and Sexual Activity  . Alcohol use: Not Currently  . Drug use: No  . Sexual activity: Not on file  Lifestyle  . Physical activity:    Days per week: Not on file    Minutes per session: Not on file  . Stress: Not on file  Relationships  . Social connections:    Talks on phone: Not on file    Gets together: Not on file    Attends religious service: Not on file    Active member of club or organization: Not on file    Attends meetings of clubs or organizations: Not on file    Relationship status: Not on file  Other Topics Concern  . Not on file  Social History Narrative  . Not on file      Review of Systems: A 12 point ROS discussed and pertinent positives are indicated in the HPI above.  All other systems are  negative.  Review of Systems  Vital Signs: BP (!) 187/93   Pulse 76   Temp (!) 97.5 F (36.4 C) (Oral)   Resp 14   SpO2 98%   Physical Exam General: 85  yo female appearing  stated age.  Well-developed, well-nourished.  No distress. HEENT: Atraumatic, normocephalic.  Conjugate gaze, extra-ocular motor intact. No scleral icterus or scleral injection. No lesions on external ears, nose, lips, or gums.  Oral mucosa moist, pink.  Neck: Symmetric with no goiter enlargement.  Chest/Lungs:  Symmetric chest with inspiration/expiration.  No labored breathing.    Heart:   No JVD appreciated.  Abdomen:  Soft, NT/ND, with + bowel sounds.   Genito-urinary: Deferred Neurologic: Alert & Oriented to person, place, and time.   Normal affect and insight.  Appropriate questions.  Moving all 4 extremities with gross sensory intact.  Pulse Exam:  + doppler of the bilateral DP and PT Extremity: healed surgical wounds of the right foot of 3rd and 5th toe amp.  Healed surgical wounds of the left foot 3rd toe  Mallampati Score:     Imaging: No results found.  Labs:  CBC: Recent Labs    06/20/18 1436 08/31/18 1543 09/01/18 1506  WBC 16.8* 8.1 8.5  HGB 18.8* 15.4 15.8*  HCT 56.8* 46.3* 48.8*  PLT 187 216 202    COAGS: Recent Labs    09/03/18 1150  INR 1.13    BMP: Recent Labs    06/20/18 1436 08/31/18 1543 09/01/18 1506  NA 134* 141 139  K 3.4* 4.5 5.0  CL 96* 106 104  CO2 22 26 27   GLUCOSE 200* 153*  195*  BUN 24* 26* 26*  CALCIUM 9.2 9.4 9.4  CREATININE 1.38* 0.99* 0.98  GFRNONAA 33*  --  50*  GFRAA 39*  --  58*    LIVER FUNCTION TESTS: Recent Labs    06/20/18 1436  BILITOT 11.0*  AST 176*  ALT 223*  ALKPHOS 141*  PROT 7.7  ALBUMIN 4.1    TUMOR MARKERS: No results for input(s): AFPTM, CEA, CA199, CHROMGRNA in the last 8760 hours.  Assessment and Plan:  Assessment:  Ms Mian is a 86 with history of PAD/CLI, sp treatment of the left leg previously.   She  has had need for further amputation of the right foot recently, including the 3rd toe.    She has healed all of her prior surgical wounds.    She has no complaints of resting pain, with no new wound.      Non-invasive lower extremity exam shows likely bilateral fem-pop and tibial disease.     I agree that given her apparent would healing capability, there is low need for angiogram at this time.    Regarding medical management, maximal medical therapy for reduction of risk factors is indicated as recommended by updated AHA guidelines1.  This includes anti-platelet medication, tight blood glucose control to a HbA1c < 7, tight blood pressure control, maximum-dose HMG-CoA reductase inhibitor.   Annual flu vaccination is also recommended, with Class 1 recommendation1.   I discussed with her the need for scheduling follow up at this time.  She agrees that given her excellent foot care, she can contact us if needed or [name] can refer.   Plan: -Continue maximal medical therapy for cardiovascular risk reduction, including anti-platelet therapy. -Continue excellent healthy foot care habits, given the presence of PAD -Annual flu vaccination is recommended in the setting of known PAD, in the absence of contra-indications.    ___________________________________________________________________   1Morley Kos MD, et al. 2016 AHA/ACC Guideline on the Management of Patients With Lower Extremity Peripheral Artery Disease: Executive Summary: A Report of the American College of Cardiology/American Heart Association Task Force on Clinical Practice Guidelines. J Am Coll Cardiol. 2017 Mar 21;69(11):1465-1508. doi: 10.1016/j.jacc.2016.11.008.    Electronically Signed: Corrie Mckusick 01/13/2019, 3:39 PM   I spent a total of    25 Minutes in face to face in clinical consultation, greater than 50% of which was counseling/coordinating care for PAD/CLI, SP left lower extremity revascularization.

## 2019-01-18 DIAGNOSIS — Z1331 Encounter for screening for depression: Secondary | ICD-10-CM | POA: Diagnosis not present

## 2019-01-18 DIAGNOSIS — I7389 Other specified peripheral vascular diseases: Secondary | ICD-10-CM | POA: Diagnosis not present

## 2019-01-18 DIAGNOSIS — E1151 Type 2 diabetes mellitus with diabetic peripheral angiopathy without gangrene: Secondary | ICD-10-CM | POA: Diagnosis not present

## 2019-01-18 DIAGNOSIS — I480 Paroxysmal atrial fibrillation: Secondary | ICD-10-CM | POA: Diagnosis not present

## 2019-01-18 DIAGNOSIS — Z0000 Encounter for general adult medical examination without abnormal findings: Secondary | ICD-10-CM | POA: Diagnosis not present

## 2019-01-18 DIAGNOSIS — M810 Age-related osteoporosis without current pathological fracture: Secondary | ICD-10-CM | POA: Diagnosis not present

## 2019-01-18 DIAGNOSIS — Z6822 Body mass index (BMI) 22.0-22.9, adult: Secondary | ICD-10-CM | POA: Diagnosis not present

## 2019-01-18 DIAGNOSIS — R2689 Other abnormalities of gait and mobility: Secondary | ICD-10-CM | POA: Diagnosis not present

## 2019-01-18 DIAGNOSIS — E7849 Other hyperlipidemia: Secondary | ICD-10-CM | POA: Diagnosis not present

## 2019-01-18 DIAGNOSIS — I10 Essential (primary) hypertension: Secondary | ICD-10-CM | POA: Diagnosis not present

## 2019-01-18 DIAGNOSIS — Z7901 Long term (current) use of anticoagulants: Secondary | ICD-10-CM | POA: Diagnosis not present

## 2019-01-18 DIAGNOSIS — M4800 Spinal stenosis, site unspecified: Secondary | ICD-10-CM | POA: Diagnosis not present

## 2019-03-31 ENCOUNTER — Ambulatory Visit (INDEPENDENT_AMBULATORY_CARE_PROVIDER_SITE_OTHER): Payer: Medicare Other | Admitting: Podiatry

## 2019-03-31 ENCOUNTER — Encounter: Payer: Self-pay | Admitting: Podiatry

## 2019-03-31 ENCOUNTER — Other Ambulatory Visit: Payer: Self-pay

## 2019-03-31 VITALS — Temp 96.6°F

## 2019-03-31 DIAGNOSIS — E0843 Diabetes mellitus due to underlying condition with diabetic autonomic (poly)neuropathy: Secondary | ICD-10-CM | POA: Diagnosis not present

## 2019-03-31 DIAGNOSIS — L989 Disorder of the skin and subcutaneous tissue, unspecified: Secondary | ICD-10-CM

## 2019-03-31 DIAGNOSIS — M79676 Pain in unspecified toe(s): Secondary | ICD-10-CM | POA: Diagnosis not present

## 2019-03-31 DIAGNOSIS — B351 Tinea unguium: Secondary | ICD-10-CM

## 2019-04-04 NOTE — Progress Notes (Signed)
Subjective: Patient is a 85 y.o. female presenting to the office today for follow up evaluation of painful callus lesions to the bilateral feet. Walking increases the pain from the calluses. She has not done anything at home for treatment. Patient also complains of elongated, thickened nails that cause pain while ambulating in shoes. She is unable to trim her own nails. Patient presents today for further treatment and evaluation.  Past Medical History:  Diagnosis Date  . Acute osteomyelitis ([place]) 08/25/2018   Right toe  . Anemia   . Atrial fibrillation ([place])   . Carpal tunnel syndrome, bilateral   . Cellulitis 08/25/2018   Right foot  . Cervical lymphadenopathy   . Compression fracture of fifth lumbar vertebra (HCC)    to T 11  . DDD (degenerative disc disease), lumbar   . DJD (degenerative joint disease)   . DOE (dyspnea on exertion)   . Dysphagia   . Dyspnea    with exertion  . Dysrhythmia    hx brief AF post op hip surg  . Esophageal dysmotility 01/31/2014   noted barium  . GERD (gastroesophageal reflux disease)   . Hematoma    right side after stent placement  . History of hiatal hernia 01/31/2014   Small, noted on Barium  . Hyperlipidemia   . Hypertension   . Jaundice   . Low back pain   . MRSA (methicillin resistant Staphylococcus aureus)   . Neuropathy   . Nocturia   . OA (osteoarthritis)   . Osteoporosis   . Ovarian cyst   . PAD (peripheral artery disease) ([place])   . Peripheral vascular disease ([place])   . Pleural effusion, left 04/17/2017   Small  . RA (rheumatoid arthritis) ([place])   . Spinal stenosis   . Toe ulcer, right, with necrosis of bone ([place]) 08/25/2018  . Type 2 diabetes mellitus ([place])   . Unsteady gait   . Urinary incontinence   . Uterine polyp   . Varicose vein of leg    Bilateral    Objective:  Physical Exam General: Alert and oriented x3 in no acute distress  Dermatology: Hyperkeratotic lesions present on the bilateral feet x 4. Pain  on palpation with a central nucleated core noted. Skin is warm, dry and supple bilateral lower extremities. Negative for open lesions or macerations. Nails are tender, long, thickened and dystrophic with subungual debris, consistent with onychomycosis, 1-5 bilateral. No signs of infection noted.  Vascular: Palpable pedal pulses bilaterally. No edema or erythema noted. Capillary refill within normal limits.  Neurological: Epicritic and protective threshold diminished bilaterally.   Musculoskeletal Exam: Pain on palpation at the keratotic lesion noted. Range of motion within normal limits bilateral. Muscle strength 5/5 in all groups bilateral.  Assessment: 1. Onychodystrophic nail bilaterally with hyperkeratosis of nails.  2. Onychomycosis of nail due to dermatophyte bilateral 3. Pre-ulcerative callus lesions noted to the bilateral feet x 4 4. H/o toe amputations bilateral    Plan of Care:  1. Patient evaluated. 2. Excisional debridement of keratoic lesions using a chisel blade was performed without incident.  3. Dressed with light dressing. 4. Mechanical debridement of nails bilaterally performed using a nail nipper. Filed with dremel without incident.  5. Continue wearing DM shoes.  6. Patient is to return to the clinic in 3 months.   [name], DPM [facility]  Dr. [name], DPM    [address]  [place], [place] [address]                Office [phone]  Fax [phone]   

## 2019-06-17 DIAGNOSIS — I739 Peripheral vascular disease, unspecified: Secondary | ICD-10-CM | POA: Diagnosis not present

## 2019-06-17 DIAGNOSIS — I480 Paroxysmal atrial fibrillation: Secondary | ICD-10-CM | POA: Diagnosis not present

## 2019-06-17 DIAGNOSIS — H538 Other visual disturbances: Secondary | ICD-10-CM | POA: Diagnosis not present

## 2019-06-17 DIAGNOSIS — E1151 Type 2 diabetes mellitus with diabetic peripheral angiopathy without gangrene: Secondary | ICD-10-CM | POA: Diagnosis not present

## 2019-06-17 DIAGNOSIS — Z7901 Long term (current) use of anticoagulants: Secondary | ICD-10-CM | POA: Diagnosis not present

## 2019-06-26 ENCOUNTER — Emergency Department (HOSPITAL_COMMUNITY): Payer: Medicare Other

## 2019-06-26 ENCOUNTER — Other Ambulatory Visit: Payer: Self-pay

## 2019-06-26 ENCOUNTER — Observation Stay (HOSPITAL_COMMUNITY): Payer: Medicare Other

## 2019-06-26 ENCOUNTER — Encounter (HOSPITAL_COMMUNITY): Payer: Self-pay | Admitting: Emergency Medicine

## 2019-06-26 ENCOUNTER — Inpatient Hospital Stay (HOSPITAL_COMMUNITY)
Admission: EM | Admit: 2019-06-26 | Discharge: 2019-06-28 | DRG: 065 | Disposition: A | Discharge status: Rehabilitation Facility | Payer: Medicare Other | Attending: Family Medicine | Admitting: Family Medicine

## 2019-06-26 DIAGNOSIS — Z20828 Contact with and (suspected) exposure to other viral communicable diseases: Secondary | ICD-10-CM | POA: Diagnosis present

## 2019-06-26 DIAGNOSIS — E1151 Type 2 diabetes mellitus with diabetic peripheral angiopathy without gangrene: Secondary | ICD-10-CM | POA: Diagnosis present

## 2019-06-26 DIAGNOSIS — Z66 Do not resuscitate: Secondary | ICD-10-CM | POA: Diagnosis not present

## 2019-06-26 DIAGNOSIS — R202 Paresthesia of skin: Secondary | ICD-10-CM | POA: Diagnosis not present

## 2019-06-26 DIAGNOSIS — R4781 Slurred speech: Secondary | ICD-10-CM | POA: Diagnosis not present

## 2019-06-26 DIAGNOSIS — Z888 Allergy status to other drugs, medicaments and biological substances status: Secondary | ICD-10-CM

## 2019-06-26 DIAGNOSIS — M810 Age-related osteoporosis without current pathological fracture: Secondary | ICD-10-CM | POA: Diagnosis present

## 2019-06-26 DIAGNOSIS — M069 Rheumatoid arthritis, unspecified: Secondary | ICD-10-CM | POA: Diagnosis present

## 2019-06-26 DIAGNOSIS — I4891 Unspecified atrial fibrillation: Secondary | ICD-10-CM | POA: Diagnosis present

## 2019-06-26 DIAGNOSIS — E119 Type 2 diabetes mellitus without complications: Secondary | ICD-10-CM

## 2019-06-26 DIAGNOSIS — Z95820 Peripheral vascular angioplasty status with implants and grafts: Secondary | ICD-10-CM

## 2019-06-26 DIAGNOSIS — I480 Paroxysmal atrial fibrillation: Secondary | ICD-10-CM | POA: Diagnosis present

## 2019-06-26 DIAGNOSIS — G459 Transient cerebral ischemic attack, unspecified: Secondary | ICD-10-CM | POA: Diagnosis present

## 2019-06-26 DIAGNOSIS — K219 Gastro-esophageal reflux disease without esophagitis: Secondary | ICD-10-CM | POA: Diagnosis present

## 2019-06-26 DIAGNOSIS — I639 Cerebral infarction, unspecified: Secondary | ICD-10-CM | POA: Diagnosis not present

## 2019-06-26 DIAGNOSIS — Z882 Allergy status to sulfonamides status: Secondary | ICD-10-CM

## 2019-06-26 DIAGNOSIS — M5136 Other intervertebral disc degeneration, lumbar region: Secondary | ICD-10-CM | POA: Diagnosis present

## 2019-06-26 DIAGNOSIS — Z7901 Long term (current) use of anticoagulants: Secondary | ICD-10-CM

## 2019-06-26 DIAGNOSIS — Z7984 Long term (current) use of oral hypoglycemic drugs: Secondary | ICD-10-CM

## 2019-06-26 DIAGNOSIS — R42 Dizziness and giddiness: Secondary | ICD-10-CM | POA: Diagnosis not present

## 2019-06-26 DIAGNOSIS — R4782 Fluency disorder in conditions classified elsewhere: Secondary | ICD-10-CM | POA: Diagnosis present

## 2019-06-26 DIAGNOSIS — R29700 NIHSS score 0: Secondary | ICD-10-CM | POA: Diagnosis present

## 2019-06-26 DIAGNOSIS — I499 Cardiac arrhythmia, unspecified: Secondary | ICD-10-CM | POA: Diagnosis not present

## 2019-06-26 DIAGNOSIS — Z7982 Long term (current) use of aspirin: Secondary | ICD-10-CM

## 2019-06-26 DIAGNOSIS — Z79899 Other long term (current) drug therapy: Secondary | ICD-10-CM

## 2019-06-26 DIAGNOSIS — Z96653 Presence of artificial knee joint, bilateral: Secondary | ICD-10-CM | POA: Diagnosis present

## 2019-06-26 DIAGNOSIS — M4800 Spinal stenosis, site unspecified: Secondary | ICD-10-CM | POA: Diagnosis present

## 2019-06-26 DIAGNOSIS — G8194 Hemiplegia, unspecified affecting left nondominant side: Secondary | ICD-10-CM | POA: Diagnosis not present

## 2019-06-26 DIAGNOSIS — Z03818 Encounter for observation for suspected exposure to other biological agents ruled out: Secondary | ICD-10-CM | POA: Diagnosis not present

## 2019-06-26 DIAGNOSIS — Z881 Allergy status to other antibiotic agents status: Secondary | ICD-10-CM

## 2019-06-26 DIAGNOSIS — R29810 Facial weakness: Secondary | ICD-10-CM | POA: Diagnosis not present

## 2019-06-26 DIAGNOSIS — I10 Essential (primary) hypertension: Secondary | ICD-10-CM | POA: Diagnosis present

## 2019-06-26 DIAGNOSIS — I63531 Cerebral infarction due to unspecified occlusion or stenosis of right posterior cerebral artery: Secondary | ICD-10-CM | POA: Diagnosis not present

## 2019-06-26 DIAGNOSIS — Z96642 Presence of left artificial hip joint: Secondary | ICD-10-CM | POA: Diagnosis present

## 2019-06-26 DIAGNOSIS — Z89421 Acquired absence of other right toe(s): Secondary | ICD-10-CM

## 2019-06-26 DIAGNOSIS — I452 Bifascicular block: Secondary | ICD-10-CM | POA: Diagnosis not present

## 2019-06-26 DIAGNOSIS — E785 Hyperlipidemia, unspecified: Secondary | ICD-10-CM | POA: Diagnosis present

## 2019-06-26 HISTORY — DX: Cerebral infarction, unspecified: I63.9

## 2019-06-26 LAB — PROTIME-INR
INR: 2 — ABNORMAL HIGH (ref 0.8–1.2)
Prothrombin Time: 22.4 seconds — ABNORMAL HIGH (ref 11.4–15.2)

## 2019-06-26 LAB — COMPREHENSIVE METABOLIC PANEL
ALT: 20 U/L (ref 0–44)
AST: 21 U/L (ref 15–41)
Albumin: 4.1 g/dL (ref 3.5–5.0)
Alkaline Phosphatase: 66 U/L (ref 38–126)
Anion gap: 10 (ref 5–15)
BUN: 21 mg/dL (ref 8–23)
CO2: 26 mmol/L (ref 22–32)
Calcium: 9.9 mg/dL (ref 8.9–10.3)
Chloride: 104 mmol/L (ref 98–111)
Creatinine, Ser: 0.77 mg/dL (ref 0.44–1.00)
GFR calc Af Amer: >60 mL/min (ref >60)
GFR calc non Af Amer: >60 mL/min (ref >60)
Glucose, Bld: 148 mg/dL — ABNORMAL HIGH (ref 70–99)
Potassium: 4.5 mmol/L (ref 3.5–5.1)
Sodium: 140 mmol/L (ref 135–145)
Total Bilirubin: 0.9 mg/dL (ref 0.3–1.2)
Total Protein: 7.4 g/dL (ref 6.5–8.1)

## 2019-06-26 LAB — CBC
HCT: 53.2 % — ABNORMAL HIGH (ref 36.0–46.0)
Hemoglobin: 17.6 g/dL — ABNORMAL HIGH (ref 12.0–15.0)
MCH: 32.1 pg (ref 26.0–34.0)
MCHC: 33.1 g/dL (ref 30.0–36.0)
MCV: 97.1 fL (ref 80.0–100.0)
Platelets: 197 10*3/uL (ref 150–400)
RBC: 5.48 MIL/uL — ABNORMAL HIGH (ref 3.87–5.11)
RDW: 12.6 % (ref 11.5–15.5)
WBC: 8.1 10*3/uL (ref 4.0–10.5)
nRBC: 0 % (ref 0.0–0.2)

## 2019-06-26 LAB — DIFFERENTIAL
Abs Immature Granulocytes: 0.02 10*3/uL (ref 0.00–0.07)
Basophils Absolute: 0 10*3/uL (ref 0.0–0.1)
Basophils Relative: 1 %
Eosinophils Absolute: 0.1 10*3/uL (ref 0.0–0.5)
Eosinophils Relative: 2 %
Immature Granulocytes: 0 %
Lymphocytes Relative: 17 %
Lymphs Abs: 1.4 10*3/uL (ref 0.7–4.0)
Monocytes Absolute: 0.7 10*3/uL (ref 0.1–1.0)
Monocytes Relative: 9 %
Neutro Abs: 5.8 10*3/uL (ref 1.7–7.7)
Neutrophils Relative %: 71 %

## 2019-06-26 LAB — SARS CORONAVIRUS 2 BY RT PCR (HOSPITAL ORDER, PERFORMED IN ~~LOC~~ HOSPITAL LAB): SARS Coronavirus 2: NEGATIVE

## 2019-06-26 LAB — APTT: aPTT: 41 seconds — ABNORMAL HIGH (ref 24–36)

## 2019-06-26 MED ORDER — WARFARIN - PHARMACIST DOSING INPATIENT: Freq: Every day | Status: DC

## 2019-06-26 MED ORDER — ASPIRIN 300 MG RE SUPP: 300.0000 mg | Freq: Every day | RECTAL | Status: DC

## 2019-06-26 MED ORDER — SENNOSIDES-DOCUSATE SODIUM 8.6-50 MG PO TABS: 1.0000 | ORAL_TABLET | Freq: Every evening | ORAL | Status: DC | PRN

## 2019-06-26 MED ORDER — HYDRALAZINE HCL 20 MG/ML IJ SOLN: 5.0000 mg | Freq: Four times a day (QID) | INTRAMUSCULAR | Status: DC | PRN

## 2019-06-26 MED ORDER — ACETAMINOPHEN 325 MG PO TABS: 650.0000 mg | ORAL_TABLET | ORAL | Status: DC | PRN

## 2019-06-26 MED ORDER — ATORVASTATIN CALCIUM 40 MG PO TABS
40.0000 mg | ORAL_TABLET | Freq: Every day | ORAL | Status: DC
  Administered 2019-06-27: 40 mg via ORAL
  Filled 2019-06-26: qty 1

## 2019-06-26 MED ORDER — WARFARIN SODIUM 2 MG PO TABS
2.0000 mg | ORAL_TABLET | ORAL | Status: DC
  Filled 2019-06-26 (×2): qty 1

## 2019-06-26 MED ORDER — STROKE: EARLY STAGES OF RECOVERY BOOK
Freq: Once | Status: AC
  Administered 2019-06-27: 07:00:00
  Filled 2019-06-26: qty 1

## 2019-06-26 MED ORDER — SODIUM CHLORIDE 0.9% FLUSH
3.0000 mL | Freq: Once | INTRAVENOUS | Status: AC
Start: 2019-06-26 — End: 2019-06-27
  Administered 2019-06-27: 3 mL via INTRAVENOUS

## 2019-06-26 MED ORDER — ASPIRIN 325 MG PO TABS
325.0000 mg | ORAL_TABLET | Freq: Every day | ORAL | Status: DC
  Administered 2019-06-27 – 2019-06-28 (×2): 325 mg via ORAL
  Filled 2019-06-26 (×2): qty 1

## 2019-06-26 MED ORDER — ACETAMINOPHEN 160 MG/5ML PO SOLN: 650.0000 mg | ORAL | Status: DC | PRN

## 2019-06-26 MED ORDER — INSULIN ASPART 100 UNIT/ML ~~LOC~~ SOLN
0.0000 [IU] | Freq: Three times a day (TID) | SUBCUTANEOUS | Status: DC
  Administered 2019-06-27 (×2): 2 [IU] via SUBCUTANEOUS
  Administered 2019-06-27: 1 [IU] via SUBCUTANEOUS
  Administered 2019-06-28 (×2): 3 [IU] via SUBCUTANEOUS

## 2019-06-26 MED ORDER — ACETAMINOPHEN 650 MG RE SUPP: 650.0000 mg | RECTAL | Status: DC | PRN

## 2019-06-26 MED ORDER — WARFARIN SODIUM 1 MG PO TABS: 1.0000 mg | ORAL_TABLET | ORAL | Status: DC

## 2019-06-26 NOTE — Progress Notes (Signed)
ANTICOAGULATION CONSULT NOTE - Initial Consult  Pharmacy Consult for Coumadin Indication: atrial fibrillation  Allergies  Allergen Reactions  . Amprenavir Other (See Comments)    Unknown reaction  . Bactrim [Sulfamethoxazole-Trimethoprim] Nausea Only  . Ceftriaxone Sodium Other (See Comments)    Tingly feeling after given, then one hour later while vanco infusing pruritic rash  . Phenergan [Promethazine Hcl] Other (See Comments)    Hallucinations  . Sulfonamide Derivatives Hives  . Vancomycin Hcl Rash    Rash happened 1hr after rocephin but immediately after infusing vancomycin    Patient Measurements: Height: 5\' 3"  (160 cm) Weight: 110 lb 3.7 oz (50 kg) IBW/kg (Calculated) : 52.4  Vital Signs: Temp: 97.7 F (36.5 C) (08/08 2237) Temp Source: Oral (08/08 2237) BP: 213/112 (08/08 2237) Pulse Rate: 64 (08/08 2237)  Labs: Recent Labs    06/26/19 1958  HGB 17.6*  HCT 53.2*  PLT 197  APTT 41*  LABPROT 22.4*  INR 2.0*  CREATININE 0.77    Estimated Creatinine Clearance: 38.4 mL/min (by C-G formula based on SCr of 0.77 mg/dL).   Medical History: Past Medical History:  Diagnosis Date  . Acute osteomyelitis ([place]) 08/25/2018   Right toe  . Anemia   . Atrial fibrillation ([place])   . Carpal tunnel syndrome, bilateral   . Cellulitis 08/25/2018   Right foot  . Cervical lymphadenopathy   . Compression fracture of fifth lumbar vertebra (HCC)    to T 11  . DDD (degenerative disc disease), lumbar   . DJD (degenerative joint disease)   . DOE (dyspnea on exertion)   . Dysphagia   . Dyspnea    with exertion  . Dysrhythmia    hx brief AF post op hip surg  . Esophageal dysmotility 01/31/2014   noted barium  . GERD (gastroesophageal reflux disease)   . Hematoma    right side after stent placement  . History of hiatal hernia 01/31/2014   Small, noted on Barium  . Hyperlipidemia   . Hypertension   . Jaundice   . Low back pain   . MRSA (methicillin resistant  Staphylococcus aureus)   . Neuropathy   . Nocturia   . OA (osteoarthritis)   . Osteoporosis   . Ovarian cyst   . PAD (peripheral artery disease) ([place])   . Peripheral vascular disease ([place])   . Pleural effusion, left 04/17/2017   Small  . RA (rheumatoid arthritis) ([place])   . Spinal stenosis   . Toe ulcer, right, with necrosis of bone ([place]) 08/25/2018  . Type 2 diabetes mellitus ([place])   . Unsteady gait   . Urinary incontinence   . Uterine polyp   . Varicose vein of leg    Bilateral    Medications:  Medications Prior to Admission  Medication Sig Dispense Refill Last Dose  . acetaminophen (TYLENOL) 325 MG tablet Take 650 mg by mouth daily with breakfast.    06/26/2019 at am  . aspirin EC 325 MG tablet Take 325 mg by mouth daily with supper.    06/25/2019 at pm  . calcium carbonate (OSCAL) 1500 (600 Ca) MG TABS tablet Take 600 mg of elemental calcium by mouth daily with breakfast.    06/26/2019 at am  . ergocalciferol (VITAMIN D2) 1.25 MG (50000 UT) capsule Take 50,000 Units by mouth every Sunday.    06/20/2019  . ferrous sulfate 325 (65 FE) MG tablet Take 325 mg by mouth daily with breakfast.   06/26/2019 at am  . metFORMIN (GLUCOPHAGE-XR)  500 MG 24 hr tablet Take 500 mg by mouth daily with supper.   06/25/2019 at pm  . metoprolol succinate (TOPROL-XL) 25 MG 24 hr tablet Take 25 mg by mouth daily with supper.    06/25/2019 at 1830  . vitamin B-12 (CYANOCOBALAMIN) 1000 MCG tablet Take 1,000 mcg by mouth daily with breakfast.    06/26/2019 at am  . warfarin (COUMADIN) 2 MG tablet Take 2 mg by mouth daily with supper. Take 0.5 tablet (1 mg) by mouth on Thursday evenings & take 1 tablet (2 mg) by mouth on all other nights.    06/25/2019 at 1830   Scheduled:  .  stroke: mapping our early stages of recovery book   Does not apply Once  . [START ON 06/27/2019] aspirin  300 mg Rectal Daily   Or  . [START ON 06/27/2019] aspirin  325 mg Oral Daily  . [START ON 06/27/2019] insulin aspart  0-9 Units Subcutaneous TID WC   . sodium chloride flush  3 mL Intravenous Once    Assessment: 85yo female admitted for TIA vs migraine, to continue Coumadin for Afib; current INR at goal with last dose of Coumadin taken 8/7.  Goal of Therapy:  INR 2-3   Plan:  Will continue home Coumadin regimen of 2mg  daily except 1mg  Thursdays and monitor INR.  Wynona Neat, PharmD, BCPS  06/26/2019,10:51 PM

## 2019-06-26 NOTE — Consult Note (Signed)
Requesting Physician:  Carlisle Cater PA-C    Chief Complaint: left side weakness,slurred speech  History obtained from: Patient and Chart     HPI:                                                                                                                                       [name] is a 85 y.o. female with past medical history of atrial fibrillation on Coumadin, diabetes mellitus, rheumatoid arthritis, peripheral artery disease presents to the ED after family witnessed episode of slurred speech and left-sided weakness.  Patient is a poor historian but states that on Friday she was at the salon and they noticed patient she was acting confused.  Her symptoms resolved completely and yesterday around 5:15 PM patient had an episode witnessed by family pressured speech was slurred.  This resolved but again reoccurred at 5:45 PM.  Patient also noticed that her left arm felt numb and weak which is subsequently improved.  Patient was brought by family to the emergency room for further evaluation.  Patient symptoms have completely resolved at this time.  Patient was admitted to medicine for further evaluation of her stroke symptoms.  Neurology was consulted  Date last known well:.  06/26/2019 Time last known well: 5:15 PM tPA Given: No, symptoms resolving in outside TPA window NIHSS: 0 Baseline MRS 0     Past Medical History:  Diagnosis Date  . Acute osteomyelitis ([place]) 08/25/2018   Right toe  . Anemia   . Atrial fibrillation ([place])   . Carpal tunnel syndrome, bilateral   . Cellulitis 08/25/2018   Right foot  . Cervical lymphadenopathy   . Compression fracture of fifth lumbar vertebra ([facility])    to T 11  . DDD (degenerative disc disease), lumbar   . DJD (degenerative joint disease)   . DOE (dyspnea on exertion)   . Dysphagia   . Dyspnea    with exertion  . Dysrhythmia    hx brief AF post op hip surg  . Esophageal dysmotility 01/31/2014   noted barium  . GERD (gastroesophageal  reflux disease)   . Hematoma    right side after stent placement  . History of hiatal hernia 01/31/2014   Small, noted on Barium  . Hyperlipidemia   . Hypertension   . Jaundice   . Low back pain   . MRSA (methicillin resistant Staphylococcus aureus)   . Neuropathy   . Nocturia   . OA (osteoarthritis)   . Osteoporosis   . Ovarian cyst   . PAD (peripheral artery disease) ([place])   . Peripheral vascular disease ([place])   . Pleural effusion, left 04/17/2017   Small  . RA (rheumatoid arthritis) ([place])   . Spinal stenosis   . Toe ulcer, right, with necrosis of bone ([place]) 08/25/2018  . Type 2 diabetes mellitus ([place])   . Unsteady gait   . Urinary incontinence   .  Uterine polyp   . Varicose vein of leg    Bilateral    Past Surgical History:  Procedure Laterality Date  . AMPUTATION Right 02/04/2013   Procedure: [name] ;  Surgeon: Wylene Simmer, MD;  Location: [facility];  Service: Orthopedics;  Laterality: Right;  . AMPUTATION TOE Right 09/03/2018   Procedure: AMPUTATION 3RD RIGHT TOE INTERPHALANGEAL;  Surgeon: [name], DPM;  Location: [facility];  Service: Podiatry;  Laterality: Right;  . APPENDECTOMY    . COLONOSCOPY    . EYE SURGERY     both cataracts  . FEMORAL ARTERY EXPLORATION Right 04/12/2017   Procedure: EVACUATION HEMATOMA RIGHT GROIN WITH FEMORAL ARTERY REPAIR;  Surgeon: Rosetta Posner, MD;  Location: [facility];  Service: Vascular;  Laterality: Right;  . INCISION AND DRAINAGE  2011   left hip inf-hemovac  . IR ANGIOGRAM EXTREMITY LEFT  03/25/2017  . IR ANGIOGRAM EXTREMITY LEFT  04/08/2017  . IR ANGIOGRAM SELECTIVE EACH ADDITIONAL VESSEL  03/25/2017  . IR FEM POP ART STENT INC PTA MOD SED  03/25/2017  . IR INFUSION THROMBOL ARTERIAL INITIAL (MS)  04/08/2017  . IR RADIOLOGIST EVAL & MGMT  02/26/2017  . IR RADIOLOGIST EVAL & MGMT  04/03/2017  . IR RADIOLOGIST EVAL & MGMT  08/20/2017  . IR RADIOLOGIST EVAL & MGMT  01/22/2018  . IR RADIOLOGIST EVAL & MGMT   01/13/2019  . IR THROMB F/U EVAL ART/VEN FINAL DAY (MS)  04/09/2017  . IR TIB-PERO ART PTA MOD SED  03/25/2017  . IR US GUIDE VASC ACCESS RIGHT  03/25/2017  . IR US GUIDE VASC ACCESS RIGHT  04/08/2017  . KNEE ARTHROSCOPY  1995   left  . NECK MASS EXCISION    . TONSILLECTOMY    . TOTAL HIP ARTHROPLASTY  2006   left-fx  . TOTAL KNEE ARTHROPLASTY  [id number]   left  . TOTAL KNEE ARTHROPLASTY  2009   right    Family History  Problem Relation Age of Onset  . Cancer Mother    Social History:  reports that she has never smoked. She has never used smokeless tobacco. She reports previous alcohol use. She reports that she does not use drugs.  Allergies:  Allergies  Allergen Reactions  . Amprenavir Other (See Comments)    Unknown reaction  . Bactrim [Sulfamethoxazole-Trimethoprim] Nausea Only  . Ceftriaxone Sodium Other (See Comments)    Tingly feeling after given, then one hour later while vanco infusing pruritic rash  . Phenergan [Promethazine Hcl] Other (See Comments)    Hallucinations  . Sulfonamide Derivatives Hives  . Vancomycin Hcl Rash    Rash happened 1hr after rocephin but immediately after infusing vancomycin    Medications:                                                                                                                        I reviewed home medication   ROS:  14 systems reviewed and negative except above   Examination:                                                                                                      General: Appears well-developed  Psych: Affect appropriate to situation Eyes: No scleral injection HENT: No OP obstrucion Head: Normocephalic.  Cardiovascular: Normal rate and regular rhythm. Respiratory: Effort normal and breath sounds normal to anterior ascultation GI: Soft.  No distension. There is no  tenderness.  Skin: WDI    Neurological Examination Mental Status: Alert, oriented, thought content appropriate.  Speech fluent without evidence of aphasia. Able to follow 3 step commands without difficulty. Cranial Nerves: II: Visual fields grossly normal,  III,IV, VI: ptosis not present, extra-ocular motions intact bilaterally, pupils equal, round, reactive to light and accommodation V,VII: smile symmetric, facial light touch sensation normal bilaterally VIII: hearing normal bilaterally IX,X: uvula rises symmetrically XI: bilateral shoulder shrug XII: midline tongue extension Motor: The patient has 5/5 strength bilaterally at biceps and triceps and shoulder in the upper extremities.  She has 4+/5 lower extremity strength.  Slightly weaker handgrip in both sides due to swan-neck deformities. Tone and bulk:normal tone throughout; mild atrophy in both arms and legs Sensory: Pinprick and light touch intact throughout, bilaterally Deep Tendon Reflexes: 2+ and symmetric throughout Plantars: Right: downgoing   Left: downgoing Cerebellar: normal finger-to-nose Gait: normal gait and station     Lab Results: Basic Metabolic Panel: Recent Labs  Lab 06/26/19 1958  NA 140  K 4.5  CL 104  CO2 26  GLUCOSE 148*  BUN 21  CREATININE 0.77  CALCIUM 9.9    CBC: Recent Labs  Lab 06/26/19 1958  WBC 8.1  NEUTROABS 5.8  HGB 17.6*  HCT 53.2*  MCV 97.1  PLT 197    Coagulation Studies: Recent Labs    06/26/19 1958  LABPROT 22.4*  INR 2.0*    Imaging: Ct Head Wo Contrast  Result Date: 06/26/2019 CLINICAL DATA:  Tingling in left hand. EXAM: CT HEAD WITHOUT CONTRAST TECHNIQUE: Contiguous axial images were obtained from the base of the skull through the vertex without intravenous contrast. COMPARISON:  October 13, 2007 FINDINGS: Brain: No subdural, epidural, or subarachnoid hemorrhage. Ventricles and sulci are unremarkable. Cerebellum, brainstem, and basal cisterns are normal. Mild  white matter changes identified. No acute cortical ischemia or infarct is noted. No mass effect or midline shift is identified. Vascular: Calcified atherosclerosis is seen in the intracranial carotids. Skull: Normal. Negative for fracture or focal lesion. Sinuses/Orbits: Opacification of the right frontal sinus is identified. Paranasal sinuses, mastoid air cells, and middle ears are otherwise normal. Other: None. IMPRESSION: 1. No acute intracranial abnormalities are identified. Electronically Signed   By: Dorise Bullion III M.D   On: 06/26/2019 19:55   Mr Angio Head Wo Contrast  Result Date: 06/27/2019 CLINICAL DATA:  Left-sided facial droop and slurred speech with left hand weakness. Three episodes in last 2 days. EXAM: MRI HEAD WITHOUT CONTRAST MRA HEAD WITHOUT CONTRAST TECHNIQUE: Multiplanar, multiecho pulse sequences of the brain and surrounding structures  were obtained without intravenous contrast. Angiographic images of the head were obtained using MRA technique without contrast. COMPARISON:  Head CT 06/26/2019 Brain MRI 09/24/2009 FINDINGS: MRI HEAD FINDINGS BRAIN: There are 2-3 small areas of abnormal diffusion restriction within the posterior right temporal lobe (series 5, image 63). The white matter signal is normal for the patient's age. The cerebral and cerebellar volume are age-appropriate. There is no hydrocephalus. The midline structures are normal. VASCULAR: There are 2 scattered foci of chronic microhemorrhage in the supratentorial brain. The major intracranial arterial and venous sinus flow voids are normal. SKULL AND UPPER CERVICAL SPINE: Calvarial bone marrow signal is normal. There is no skull base mass. The visualized upper cervical spine and soft tissues are normal. SINUSES/ORBITS: There are no fluid levels or advanced mucosal thickening. The mastoid air cells and middle ear cavities are free of fluid. The orbits are normal. MRA HEAD FINDINGS POSTERIOR CIRCULATION: --Vertebral arteries:  Normal V4 segments. --Posterior inferior cerebellar arteries (PICA): The left PICA and AICA share a common origin. Normal origin of right PICA from the ipsilateral vertebral artery. --Anterior inferior cerebellar arteries (AICA): Patent origins from the basilar artery. --Basilar artery: Normal. --Superior cerebellar arteries: Normal. --Posterior cerebral arteries (PCA): The right PCA is occluded at the P2-3 junction with distal reconstitution. The left PCA is occluded as short segment of the proximal P2 segment. The distal left PCA is patent. ANTERIOR CIRCULATION: --Intracranial internal carotid arteries: Normal. --Anterior cerebral arteries (ACA): Multifocal stenosis of the left anterior cerebral artery with areas of intermittent loss of normal flow related enhancement. --Middle cerebral arteries (MCA): Short segment loss of enhancement of the right middle cerebral artery distal M1 segment. The M2 branches are normal. The left M1 segment is normal. There is moderate stenosis of the left M2 inferior division. IMPRESSION: 1. 2-3 small foci of acute to early subacute ischemia within the posterior right temporal lobe, within the PCA territory. 2. 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 3. Short segment occlusion of the right MCA M1 segment, just proximal to the bifurcation, with normal M2 branches. 4. Moderate stenosis of the left M2 inferior division. Electronically Signed   By: Ulyses Jarred M.D.   On: 06/27/2019 00:42   Mr Brain Wo Contrast  Result Date: 06/27/2019 CLINICAL DATA:  Left-sided facial droop and slurred speech with left hand weakness. Three episodes in last 2 days. EXAM: MRI HEAD WITHOUT CONTRAST MRA HEAD WITHOUT CONTRAST TECHNIQUE: Multiplanar, multiecho pulse sequences of the brain and surrounding structures were obtained without intravenous contrast. Angiographic images of the head were obtained using MRA  technique without contrast. COMPARISON:  Head CT 06/26/2019 Brain MRI 09/24/2009 FINDINGS: MRI HEAD FINDINGS BRAIN: There are 2-3 small areas of abnormal diffusion restriction within the posterior right temporal lobe (series 5, image 63). The white matter signal is normal for the patient's age. The cerebral and cerebellar volume are age-appropriate. There is no hydrocephalus. The midline structures are normal. VASCULAR: There are 2 scattered foci of chronic microhemorrhage in the supratentorial brain. The major intracranial arterial and venous sinus flow voids are normal. SKULL AND UPPER CERVICAL SPINE: Calvarial bone marrow signal is normal. There is no skull base mass. The visualized upper cervical spine and soft tissues are normal. SINUSES/ORBITS: There are no fluid levels or advanced mucosal thickening. The mastoid air cells and middle ear cavities are free of fluid. The orbits are normal. MRA HEAD FINDINGS POSTERIOR CIRCULATION: --  Vertebral arteries: Normal V4 segments. --Posterior inferior cerebellar arteries (PICA): The left PICA and AICA share a common origin. Normal origin of right PICA from the ipsilateral vertebral artery. --Anterior inferior cerebellar arteries (AICA): Patent origins from the basilar artery. --Basilar artery: Normal. --Superior cerebellar arteries: Normal. --Posterior cerebral arteries (PCA): The right PCA is occluded at the P2-3 junction with distal reconstitution. The left PCA is occluded as short segment of the proximal P2 segment. The distal left PCA is patent. ANTERIOR CIRCULATION: --Intracranial internal carotid arteries: Normal. --Anterior cerebral arteries (ACA): Multifocal stenosis of the left anterior cerebral artery with areas of intermittent loss of normal flow related enhancement. --Middle cerebral arteries (MCA): Short segment loss of enhancement of the right middle cerebral artery distal M1 segment. The M2 branches are normal. The left M1 segment is normal. There is  moderate stenosis of the left M2 inferior division. IMPRESSION: 1. 2-3 small foci of acute to early subacute ischemia within the posterior right temporal lobe, within the PCA territory. 2. 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 3. Short segment occlusion of the right MCA M1 segment, just proximal to the bifurcation, with normal M2 branches. 4. Moderate stenosis of the left M2 inferior division. Electronically Signed   By: Ulyses Jarred M.D.   On: 06/27/2019 00:42     ASSESSMENT AND PLAN   85 y.o. female with past medical history of atrial fibrillation on Coumadin, diabetes mellitus, rheumatoid arthritis, peripheral artery disease presents to the ED after family witnessed episode of slurred speech and left-sided weakness. Her symptoms are consistent with a stroke and MRI brain revealed acute infarcts in the right PCA territory. Etiology of her stroke is likely cardioembolic although patient does have intracranial atherosclerosis and this may be atheroembolic infarcts.  Unclear as to why patient is on Coumadin instead of Eliquis, patient was in the lower range of therapeutic level.  There were times in the past where she was subtherapeutic.   Acute Ischemic Stroke   Risk factors: A. fib, diabetes mellitus Etiology:  Cardiomebolic vs Intracranial atherosclerosis  Recommend # MRI of the brain without contrast #MRA Head and neck :  #Transthoracic Echo  # Continue ASA, Coumadin: however may consider switching to Eliquis  #Start or continue Atorvastatin 80 mg/other high intensity statin # BP goal: permissive HTN upto 220/120 mmHg ( 185/110 if patient has CHF, CKD) # HBAIC and Lipid profile # Telemetry monitoring # Frequent neuro checks # NPO until passes stroke swallow screen  Please page stroke NP  Or  PA  Or MD from 8am -4 pm  as this patient from this time will be  followed by the stroke.   You can look  them up on [email or web address]  [facility]   Johndavid Geralds [facility] Pager Number [phone]

## 2019-06-26 NOTE — ED Provider Notes (Signed)
[facility] EMERGENCY DEPARTMENT Provider Note   CSN: [id number] Arrival date & time: 06/26/19  9509     History   Chief Complaint Chief Complaint  Patient presents with  . Transient Ischemic Attack    HPI [name] is a 85 y.o. female.     Patient with history of peripheral vascular disease, atrial fibrillation on Coumadin --presents to the emergency department today after 3 episodes over the past 2 days of left-sided facial droop, slurred speech, and left hand weakness.  History taken from patient and her son at bedside.  Patient has been having some vision changes over the past 2 weeks in which she describes vision as being "wavy".  These are brief episodes and she has talked with her primary care doctor about this and is scheduled for a "scan of the neck" this coming week.  Yesterday while at the hairdresser she seemed more weak than normal and somewhat confused for a time.  Upon returning home, her son saw her and she had left-sided facial droop and mumbled/slurred speech lasting 5 to 10 minutes.  In addition she had some weakness and tingling in her left hand.  This seemed to resolve.  Symptoms returned at approximately 5:15 PM today and lasted for 2 to 3 minutes and then reoccurred at approximately 5:45 PM.  Patient is back to her baseline at the current time.  She reports weakness in her left hand but does not really noticed that she is having difficulty speaking.  Patient does not have any history of stroke or blockages in her neck.  The onset of this condition was acute. The course is resolved. Aggravating factors: none. Alleviating factors: none.       Past Medical History:  Diagnosis Date  . Acute osteomyelitis ([place]) 08/25/2018   Right toe  . Anemia   . Atrial fibrillation ([place])   . Carpal tunnel syndrome, bilateral   . Cellulitis 08/25/2018   Right foot  . Cervical lymphadenopathy   . Compression fracture of fifth lumbar vertebra (H[facility])    to T 11   . DDD (degenerative disc disease), lumbar   . DJD (degenerative joint disease)   . DOE (dyspnea on exertion)   . Dysphagia   . Dyspnea    with exertion  . Dysrhythmia    hx brief AF post op hip surg  . Esophageal dysmotility 01/31/2014   noted barium  . GERD (gastroesophageal reflux disease)   . Hematoma    right side after stent placement  . History of hiatal hernia 01/31/2014   Small, noted on Barium  . Hyperlipidemia   . Hypertension   . Jaundice   . Low back pain   . MRSA (methicillin resistant Staphylococcus aureus)   . Neuropathy   . Nocturia   . OA (osteoarthritis)   . Osteoporosis   . Ovarian cyst   . PAD (peripheral artery disease) ([place])   . Peripheral vascular disease ([place])   . Pleural effusion, left 04/17/2017   Small  . RA (rheumatoid arthritis) ([place])   . Spinal stenosis   . Toe ulcer, right, with necrosis of bone ([place]) 08/25/2018  . Type 2 diabetes mellitus ([place])   . Unsteady gait   . Urinary incontinence   . Uterine polyp   . Varicose vein of leg    Bilateral    Patient Active Problem List   Diagnosis Date Noted  . Degeneration of lumbar intervertebral disc 12/11/2017  . Cough   .  Hematoma   . Groin swelling 04/12/2017  . Hematoma of groin 04/12/2017  . Critical lower limb ischemia 04/08/2017  . Long term (current) use of anticoagulants [Z79.01] 12/13/2015  . Closed dislocation of tarsometatarsal joint 10/05/2015  . ULCER OF OTHER PART OF FOOT 06/06/2010  . ESSENTIAL HYPERTENSION 03/26/2010  . ATRIAL FIBRILLATION 03/26/2010  . CUTANEOUS ERUPTIONS, DRUG-INDUCED 03/26/2010  . DIARRHEA 03/26/2010  . GERD 03/20/2010  . UNSPEC LOCAL INFECTION SKIN&SUBCUTANEOUS TISSUE 03/20/2010  . OSTEOARTHRITIS 03/20/2010  . ARTHRITIS, SEPTIC 03/14/2010  . OTH COMPLICATIONS DUE INTERNAL JOINT PROSTHESIS 02/22/2010    Past Surgical History:  Procedure Laterality Date  . AMPUTATION Right 02/04/2013   Procedure: [name] ;  Surgeon: Wylene Simmer,  MD;  Location: [facility];  Service: Orthopedics;  Laterality: Right;  . AMPUTATION TOE Right 09/03/2018   Procedure: AMPUTATION 3RD RIGHT TOE INTERPHALANGEAL;  Surgeon: [name], DPM;  Location: [facility];  Service: Podiatry;  Laterality: Right;  . APPENDECTOMY    . COLONOSCOPY    . EYE SURGERY     both cataracts  . FEMORAL ARTERY EXPLORATION Right 04/12/2017   Procedure: EVACUATION HEMATOMA RIGHT GROIN WITH FEMORAL ARTERY REPAIR;  Surgeon: Rosetta Posner, MD;  Location: [facility];  Service: Vascular;  Laterality: Right;  . INCISION AND DRAINAGE  2011   left hip inf-hemovac  . IR ANGIOGRAM EXTREMITY LEFT  03/25/2017  . IR ANGIOGRAM EXTREMITY LEFT  04/08/2017  . IR ANGIOGRAM SELECTIVE EACH ADDITIONAL VESSEL  03/25/2017  . IR FEM POP ART STENT INC PTA MOD SED  03/25/2017  . IR INFUSION THROMBOL ARTERIAL INITIAL (MS)  04/08/2017  . IR RADIOLOGIST EVAL & MGMT  02/26/2017  . IR RADIOLOGIST EVAL & MGMT  04/03/2017  . IR RADIOLOGIST EVAL & MGMT  08/20/2017  . IR RADIOLOGIST EVAL & MGMT  01/22/2018  . IR RADIOLOGIST EVAL & MGMT  01/13/2019  . IR THROMB F/U EVAL ART/VEN FINAL DAY (MS)  04/09/2017  . IR TIB-PERO ART PTA MOD SED  03/25/2017  . IR US GUIDE VASC ACCESS RIGHT  03/25/2017  . IR US GUIDE VASC ACCESS RIGHT  04/08/2017  . KNEE ARTHROSCOPY  1995   left  . NECK MASS EXCISION    . TONSILLECTOMY    . TOTAL HIP ARTHROPLASTY  2006   left-fx  . TOTAL KNEE ARTHROPLASTY  [id number]   left  . TOTAL KNEE ARTHROPLASTY  2009   right     OB History   No obstetric history on file.      Home Medications    Prior to Admission medications   Medication Sig Start Date End Date Taking? Authorizing Provider  acetaminophen (TYLENOL) 325 MG tablet Take 650 mg by mouth daily.     [provider]  aspirin EC 325 MG tablet Take 325 mg by mouth at bedtime.    [provider]  calcium carbonate (OSCAL) 1500 (600 Ca) MG TABS tablet Take 600 mg of elemental calcium by mouth daily with  breakfast.     [provider]  docusate sodium (COLACE) 100 MG capsule Take 1 capsule (100 mg total) by mouth daily. 04/17/17   Reyne Dumas, MD  doxycycline (VIBRA-TABS) 100 MG tablet Take 1 tablet (100 mg total) by mouth 2 (two) times daily. 08/24/18   [name], DPM  ergocalciferol (VITAMIN D2) 1.25 MG (50000 UT) capsule ergocalciferol (vitamin D2) 1,250 mcg (50,000 unit) capsule  TK 1 C PO 1 TIME WEEKLY  [provider]  ferrous sulfate 325 (65 FE) MG tablet Take 325 mg by mouth daily with breakfast.    [provider]  gentamicin cream (GARAMYCIN) 0.1 % Apply 1 application topically 3 (three) times daily. Patient taking differently: Apply 1 application topically daily. Applied to affected area of toe 08/25/18   Landis Martins, DPM  linagliptin (TRADJENTA) 5 MG TABS tablet Take 5 mg by mouth daily.    [provider]  metFORMIN (GLUCOPHAGE) 1000 MG tablet Take 0.5 tablets (500 mg total) by mouth 2 (two) times daily. 06/20/18   Wurst, [place], PA-C  metoprolol succinate (TOPROL-XL) 25 MG 24 hr tablet Take 25 mg by mouth at bedtime.     [provider]  pantoprazole (PROTONIX) 40 MG tablet Take 1 tablet (40 mg total) by mouth daily. Patient taking differently: Take 40 mg by mouth every evening.  04/17/17   Reyne Dumas, MD  vitamin B-12 (CYANOCOBALAMIN) 1000 MCG tablet Take 1,000 mcg by mouth daily.    [provider]  Vitamin D, Ergocalciferol, (DRISDOL) 50000 units CAPS capsule Take 50,000 Units by mouth every Sunday. Sundays     [provider]  warfarin (COUMADIN) 2 MG tablet Take 1-2 mg by mouth See admin instructions. Take 0.5 tablet (1 mg) by mouth on Thursday evenings & take 1 tablet (2 mg) by mouth on all other nights.    [provider]    Family History Family History  Problem Relation Age of Onset  . Cancer Mother     Social History Social History   Tobacco Use  . Smoking status: Never Smoker  .  Smokeless tobacco: Never Used  Substance Use Topics  . Alcohol use: Not Currently  . Drug use: No     Allergies   Amprenavir, Bactrim [sulfamethoxazole-trimethoprim], Ceftriaxone sodium, Phenergan [promethazine hcl], Sulfonamide derivatives, and Vancomycin hcl   Review of Systems Review of Systems  Constitutional: Negative for fever.  HENT: Negative for rhinorrhea and sore throat.   Eyes: Positive for visual disturbance. Negative for redness.  Respiratory: Negative for cough.   Cardiovascular: Negative for chest pain.  Gastrointestinal: Negative for abdominal pain, diarrhea, nausea and vomiting.  Genitourinary: Negative for dysuria.  Musculoskeletal: Negative for myalgias.  Skin: Negative for rash.  Neurological: Positive for dizziness, facial asymmetry, speech difficulty and weakness. Negative for headaches.     Physical Exam Updated Vital Signs BP (!) 143/95 (BP Location: Right Arm)   Pulse 74   Temp 98.2 F (36.8 C) (Oral)   Resp 16   SpO2 96%   Physical Exam Vitals signs and nursing note reviewed.  Constitutional:      Appearance: She is well-developed.  HENT:     Head: Normocephalic and atraumatic.     Right Ear: Tympanic membrane, ear canal and external ear normal.     Left Ear: Tympanic membrane, ear canal and external ear normal.     Nose: Nose normal.     Mouth/Throat:     Pharynx: Uvula midline.  Eyes:     General: Lids are normal.     Extraocular Movements:     Right eye: No nystagmus.     Left eye: No nystagmus.     Conjunctiva/sclera: Conjunctivae normal.     Pupils: Pupils are equal, round, and reactive to light.  Neck:     Musculoskeletal: Normal range of motion and neck supple.  Cardiovascular:     Rate and Rhythm: Normal rate and regular rhythm.  Pulmonary:  Effort: Pulmonary effort is normal.     Breath sounds: Normal breath sounds.  Abdominal:     Palpations: Abdomen is soft.     Tenderness: There is no abdominal tenderness.   Musculoskeletal:     Cervical back: She exhibits normal range of motion, no tenderness and no bony tenderness.  Skin:    General: Skin is warm and dry.  Neurological:     Mental Status: She is alert and oriented to person, place, and time.     GCS: GCS eye subscore is 4. GCS verbal subscore is 5. GCS motor subscore is 6.     Cranial Nerves: No cranial nerve deficit.     Sensory: No sensory deficit.     Motor: Motor function is intact. No weakness.     Coordination: Coordination normal.      ED Treatments / Results  Labs (all labs ordered are listed, but only abnormal results are displayed) Labs Reviewed  PROTIME-INR - Abnormal; Notable for the following components:      Result Value   Prothrombin Time 22.4 (*)    INR 2.0 (*)    All other components within normal limits  APTT - Abnormal; Notable for the following components:   aPTT 41 (*)    All other components within normal limits  CBC - Abnormal; Notable for the following components:   RBC 5.48 (*)    Hemoglobin 17.6 (*)    HCT 53.2 (*)    All other components within normal limits  COMPREHENSIVE METABOLIC PANEL - Abnormal; Notable for the following components:   Glucose, Bld 148 (*)    All other components within normal limits  SARS CORONAVIRUS 2 (HOSPITAL ORDER, [facility])  DIFFERENTIAL   ED ECG REPORT   Date: 06/26/2019  Rate: 73  Rhythm: atrial fibrillation  QRS Axis: left  Intervals: normal  ST/T Wave abnormalities: normal  Conduction Disutrbances:right bundle branch block and left anterior fascicular block  Narrative Interpretation:   Old EKG Reviewed: unchanged  I have personally reviewed the EKG tracing and agree with the computerized printout as noted.  Radiology Ct Head Wo Contrast  Result Date: 06/26/2019 CLINICAL DATA:  Tingling in left hand. EXAM: CT HEAD WITHOUT CONTRAST TECHNIQUE: Contiguous axial images were obtained from the base of the skull through the vertex  without intravenous contrast. COMPARISON:  October 13, 2007 FINDINGS: Brain: No subdural, epidural, or subarachnoid hemorrhage. Ventricles and sulci are unremarkable. Cerebellum, brainstem, and basal cisterns are normal. Mild white matter changes identified. No acute cortical ischemia or infarct is noted. No mass effect or midline shift is identified. Vascular: Calcified atherosclerosis is seen in the intracranial carotids. Skull: Normal. Negative for fracture or focal lesion. Sinuses/Orbits: Opacification of the right frontal sinus is identified. Paranasal sinuses, mastoid air cells, and middle ears are otherwise normal. Other: None. IMPRESSION: 1. No acute intracranial abnormalities are identified. Electronically Signed   By: Dorise Bullion III M.D   On: 06/26/2019 19:55    Procedures Procedures (including critical care time)  Medications Ordered in ED Medications  sodium chloride flush (NS) 0.9 % injection 3 mL (has no administration in time range)     Initial Impression / Assessment and Plan / ED Course  I have reviewed the triage vital signs and the nursing notes.  Pertinent labs & imaging results that were available during my care of the patient were reviewed by me and considered in my medical decision making (see chart for details).  Patient seen and examined.  History is highly concerning for accelerated TIAs.  Feel patient will require admission to the hospital for TIA work-up and risk factor modification.  Discussed with Dr. Jeanell Sparrow.  CT head and EKG personally reviewed.  Vital signs reviewed and are as follows: BP (!) 143/95 (BP Location: Right Arm)   Pulse 74   Temp 98.2 F (36.8 C) (Oral)   Resp 16   SpO2 96%   9:27 PM Pt stable. Updated son and patient.   Spoke with Dr. Lorraine Lax. Neurology will see patient. Agrees with admit. MRI/MRA of the head ordered.    Final Clinical Impressions(s) / ED Diagnoses   Final diagnoses:  TIA (transient ischemic attack)   Admit,  concern for TIA.   ED Discharge Orders    None       Carlisle Cater, Hershal Coria 06/26/19 [id number]    Pattricia Boss, MD 06/26/19 [phone]

## 2019-06-26 NOTE — ED Notes (Signed)
Pt alert moves al extremities

## 2019-06-26 NOTE — H&P (Signed)
History and Physical    [name] ZOX:[id number] DOB: 1928-07-11 DOA: 06/26/2019  PCP: Leanna Battles, MD  Patient coming from: Home  I have personally briefly reviewed patient's old medical records in [place]  Chief Complaint: Transient episodes of slurred speech and left hand weakness  HPI: [name] is a 85 y.o. female with medical history significant for paroxysmal atrial fibrillation on Coumadin, peripheral vascular disease s/p left SFA angioplasty and stenting, type 2 diabetes, hypertension, hyperlipidemia, GERD, osteoarthritis, and degenerative disc disease/spinal stenosis who presents to the ED for evaluation of transient episodes of slurred speech, left hand weakness.  Patient states that over the last 2 days she has had 3 episodes of transient slurred speech, left hand weakness, and left-sided facial droop.  These episodes lasted about 5 minutes before resolving.  These episodes were witnessed once by her hairdresser and the other times by her family with last episode earlier today around 5:45 PM.  She has also noticed some "wavy" vision intermittently over the last week.  She also reports intermittent headaches.  She denies any chest pain, palpitations, dyspnea, headache, nausea, vomiting, difficulty with swallowing, abdominal pain, dysuria, diarrhea, constipation, falls, or loss of consciousness.  Patient is taking Coumadin for atrial fibrillation and is on aspirin 325 mg for history of peripheral vascular disease.  She denies any obvious bleeding including epistaxis, hematemesis, hemoptysis, hematochezia, materia, or melena.  ED Course:  Initial vitals showed BP 143/95, pulse 74, RR 16, temp 98.2 Fahrenheit, SPO2 96% on room air.  Repeat blood pressure showed hypertension up to 213/112.  Labs notable for WBC 8.1, hemoglobin 17.6, platelets 197,000, INR 2.0, sodium 140, potassium 4.5, bicarb 26, BUN 21, creatinine 0.77, serum glucose 148.  SARS-CoV-2 test was  negative.  CT head without contrast was negative for acute intracranial abnormalities.  Neurology were consulted and recommended further ischemic work-up with differential diagnosis including migraines.  The hospitalist service was consulted to admit for further evaluation and management.  Review of Systems: All systems reviewed and are negative except as documented in history of present illness above.   Past Medical History:  Diagnosis Date  . Acute osteomyelitis ([place]) 08/25/2018   Right toe  . Anemia   . Atrial fibrillation ([place])   . Carpal tunnel syndrome, bilateral   . Cellulitis 08/25/2018   Right foot  . Cervical lymphadenopathy   . Compression fracture of fifth lumbar vertebra (HCC)    to T 11  . DDD (degenerative disc disease), lumbar   . DJD (degenerative joint disease)   . DOE (dyspnea on exertion)   . Dysphagia   . Dyspnea    with exertion  . Dysrhythmia    hx brief AF post op hip surg  . Esophageal dysmotility 01/31/2014   noted barium  . GERD (gastroesophageal reflux disease)   . Hematoma    right side after stent placement  . History of hiatal hernia 01/31/2014   Small, noted on Barium  . Hyperlipidemia   . Hypertension   . Jaundice   . Low back pain   . MRSA (methicillin resistant Staphylococcus aureus)   . Neuropathy   . Nocturia   . OA (osteoarthritis)   . Osteoporosis   . Ovarian cyst   . PAD (peripheral artery disease) ([place])   . Peripheral vascular disease ([place])   . Pleural effusion, left 04/17/2017   Small  . RA (rheumatoid arthritis) ([place])   . Spinal stenosis   . Toe ulcer, right, with  necrosis of bone ([place]) 08/25/2018  . Type 2 diabetes mellitus ([place])   . Unsteady gait   . Urinary incontinence   . Uterine polyp   . Varicose vein of leg    Bilateral    Past Surgical History:  Procedure Laterality Date  . AMPUTATION Right 02/04/2013   Procedure: [name] ;  Surgeon: Wylene Simmer, MD;  Location: [facility];   Service: Orthopedics;  Laterality: Right;  . AMPUTATION TOE Right 09/03/2018   Procedure: AMPUTATION 3RD RIGHT TOE INTERPHALANGEAL;  Surgeon: [name], DPM;  Location: [facility];  Service: Podiatry;  Laterality: Right;  . APPENDECTOMY    . COLONOSCOPY    . EYE SURGERY     both cataracts  . FEMORAL ARTERY EXPLORATION Right 04/12/2017   Procedure: EVACUATION HEMATOMA RIGHT GROIN WITH FEMORAL ARTERY REPAIR;  Surgeon: Rosetta Posner, MD;  Location: [facility];  Service: Vascular;  Laterality: Right;  . INCISION AND DRAINAGE  2011   left hip inf-hemovac  . IR ANGIOGRAM EXTREMITY LEFT  03/25/2017  . IR ANGIOGRAM EXTREMITY LEFT  04/08/2017  . IR ANGIOGRAM SELECTIVE EACH ADDITIONAL VESSEL  03/25/2017  . IR FEM POP ART STENT INC PTA MOD SED  03/25/2017  . IR INFUSION THROMBOL ARTERIAL INITIAL (MS)  04/08/2017  . IR RADIOLOGIST EVAL & MGMT  02/26/2017  . IR RADIOLOGIST EVAL & MGMT  04/03/2017  . IR RADIOLOGIST EVAL & MGMT  08/20/2017  . IR RADIOLOGIST EVAL & MGMT  01/22/2018  . IR RADIOLOGIST EVAL & MGMT  01/13/2019  . IR THROMB F/U EVAL ART/VEN FINAL DAY (MS)  04/09/2017  . IR TIB-PERO ART PTA MOD SED  03/25/2017  . IR US GUIDE VASC ACCESS RIGHT  03/25/2017  . IR US GUIDE VASC ACCESS RIGHT  04/08/2017  . KNEE ARTHROSCOPY  1995   left  . NECK MASS EXCISION    . TONSILLECTOMY    . TOTAL HIP ARTHROPLASTY  2006   left-fx  . TOTAL KNEE ARTHROPLASTY  [id number]   left  . TOTAL KNEE ARTHROPLASTY  2009   right    Social History:  reports that she has never smoked. She has never used smokeless tobacco. She reports previous alcohol use. She reports that she does not use drugs.  Allergies  Allergen Reactions  . Amprenavir Other (See Comments)    Unknown reaction  . Bactrim [Sulfamethoxazole-Trimethoprim] Nausea Only  . Ceftriaxone Sodium Other (See Comments)    Tingly feeling after given, then one hour later while vanco infusing pruritic rash  . Phenergan [Promethazine Hcl] Other (See Comments)    Hallucinations   . Sulfonamide Derivatives Hives  . Vancomycin Hcl Rash    Rash happened 1hr after rocephin but immediately after infusing vancomycin    Family History  Problem Relation Age of Onset  . Cancer Mother      Prior to Admission medications   Medication Sig Start Date End Date Taking? Authorizing Provider  acetaminophen (TYLENOL) 325 MG tablet Take 650 mg by mouth daily with breakfast.    Yes [provider]  aspirin EC 325 MG tablet Take 325 mg by mouth daily with supper.    Yes [provider]  calcium carbonate (OSCAL) 1500 (600 Ca) MG TABS tablet Take 600 mg of elemental calcium by mouth daily with breakfast.    Yes [provider]  ergocalciferol (VITAMIN D2) 1.25 MG (50000 UT) capsule Take 50,000 Units by mouth every Sunday.    Yes [provider]  ferrous sulfate 325 (65 FE) MG tablet Take 325 mg by mouth daily with breakfast.   Yes [provider]  metFORMIN (GLUCOPHAGE-XR) 500 MG 24 hr tablet Take 500 mg by mouth daily with supper. 05/11/19  Yes [provider]  metoprolol succinate (TOPROL-XL) 25 MG 24 hr tablet Take 25 mg by mouth daily with supper.    Yes [provider]  vitamin B-12 (CYANOCOBALAMIN) 1000 MCG tablet Take 1,000 mcg by mouth daily with breakfast.    Yes [provider]  warfarin (COUMADIN) 2 MG tablet Take 2 mg by mouth daily with supper. Take 0.5 tablet (1 mg) by mouth on Thursday evenings & take 1 tablet (2 mg) by mouth on all other nights.    Yes [provider]  pantoprazole (PROTONIX) 40 MG tablet Take 1 tablet (40 mg total) by mouth daily. Patient not taking: Reported on 06/26/2019 04/17/17 06/26/19  Reyne Dumas, MD    Physical Exam: Vitals:   06/26/19 2045 06/26/19 2100 06/26/19 2200 06/26/19 2237  BP: (!) 200/95 (!) 196/114 (!) 196/112 (!) 213/112  Pulse: (!) 46 (!) 106 79 64  Resp: (!) 26 (!) 21 18 17   Temp:    97.7 F (36.5 C)  TempSrc:    Oral  SpO2: 95% 100% 100% 95%   Weight:    50 kg  Height:    5\' 3"  (1.6 m)    Constitutional: Thin elderly woman resting supine in bed, NAD, calm, comfortable, speaking in clear full sentences Eyes: PERRL, EOMI, lids and conjunctivae normal ENMT: Mucous membranes are moist. Posterior pharynx clear of any exudate or lesions.Normal dentition.  Neck: normal, supple, no masses. Respiratory: clear to auscultation bilaterally, no wheezing, no crackles. Normal respiratory effort. No accessory muscle use.  Cardiovascular: Irregularly irregular, no murmurs / rubs / gallops. No extremity edema. 1+ pedal pulses. Abdomen: no tenderness, no masses palpated. No hepatosplenomegaly. Bowel sounds positive.  Musculoskeletal: no clubbing / cyanosis.  Osteoarthritic changes of fingers of both hands at the DIPs. Good ROM, no contractures. Normal muscle tone.  Status post amputation of the right third and fifth toes and left third toe. Skin: no rashes, lesions, ulcers. No induration.  Well-healed bilateral knee replacement scars. Neurologic: CN 2-12 grossly intact. Sensation diminished bilateral lower extremities which patient states is chronic due to peripheral vascular disease otherwise sensation is intact, Strength 5/5 in all 4.  No dysmetria. Psychiatric: Normal judgment and insight. Alert and oriented x 3. Normal mood.     Labs on Admission: I have personally reviewed following labs and imaging studies  CBC: Recent Labs  Lab 06/26/19 1958  WBC 8.1  NEUTROABS 5.8  HGB 17.6*  HCT 53.2*  MCV 97.1  PLT 536   Basic Metabolic Panel: Recent Labs  Lab 06/26/19 1958  NA 140  K 4.5  CL 104  CO2 26  GLUCOSE 148*  BUN 21  CREATININE 0.77  CALCIUM 9.9   GFR: Estimated Creatinine Clearance: 38.4 mL/min (by C-G formula based on SCr of 0.77 mg/dL). Liver Function Tests: Recent Labs  Lab 06/26/19 1958  AST 21  ALT 20  ALKPHOS 66  BILITOT 0.9  PROT 7.4  ALBUMIN 4.1   No results for input(s): LIPASE, AMYLASE in the last 168  hours. No results for input(s): AMMONIA in the last 168 hours. Coagulation Profile: Recent Labs  Lab 06/26/19 1958  INR 2.0*   Cardiac Enzymes: No results for input(s): CKTOTAL, CKMB, CKMBINDEX, TROPONINI in the last 168 hours.  BNP (last 3 results) No results for input(s): PROBNP in the last 8760 hours. HbA1C: No results for input(s): HGBA1C in the last 72 hours. CBG: No results for input(s): GLUCAP in the last 168 hours. Lipid Profile: No results for input(s): CHOL, HDL, LDLCALC, TRIG, CHOLHDL, LDLDIRECT in the last 72 hours. Thyroid Function Tests: No results for input(s): TSH, T4TOTAL, FREET4, T3FREE, THYROIDAB in the last 72 hours. Anemia Panel: No results for input(s): VITAMINB12, FOLATE, FERRITIN, TIBC, IRON, RETICCTPCT in the last 72 hours. Urine analysis:    Component Value Date/Time   COLORURINE YELLOW 12/18/2010 0925   APPEARANCEUR CLEAR 12/18/2010 0925   LABSPEC >=1.030 06/20/2018 1441   PHURINE 5.5 06/20/2018 1441   GLUCOSEU 100 (A) 06/20/2018 1441   HGBUR MODERATE (A) 06/20/2018 1441   BILIRUBINUR LARGE (A) 06/20/2018 1441   KETONESUR TRACE (A) 06/20/2018 1441   PROTEINUR 100 (A) 06/20/2018 1441   UROBILINOGEN 2.0 (H) 06/20/2018 1441   NITRITE NEGATIVE 06/20/2018 1441   LEUKOCYTESUR LARGE (A) 06/20/2018 1441    Radiological Exams on Admission: Ct Head Wo Contrast  Result Date: 06/26/2019 CLINICAL DATA:  Tingling in left hand. EXAM: CT HEAD WITHOUT CONTRAST TECHNIQUE: Contiguous axial images were obtained from the base of the skull through the vertex without intravenous contrast. COMPARISON:  October 13, 2007 FINDINGS: Brain: No subdural, epidural, or subarachnoid hemorrhage. Ventricles and sulci are unremarkable. Cerebellum, brainstem, and basal cisterns are normal. Mild white matter changes identified. No acute cortical ischemia or infarct is noted. No mass effect or midline shift is identified. Vascular: Calcified atherosclerosis is seen in the intracranial  carotids. Skull: Normal. Negative for fracture or focal lesion. Sinuses/Orbits: Opacification of the right frontal sinus is identified. Paranasal sinuses, mastoid air cells, and middle ears are otherwise normal. Other: None. IMPRESSION: 1. No acute intracranial abnormalities are identified. Electronically Signed   By: Dorise Bullion III M.D   On: 06/26/2019 19:55    EKG: Independently reviewed. Atrial fibrillation with incomplete RBBB, LAFB.  Rate is faster when compared to prior.  Assessment/Plan Principal Problem:   TIA (transient ischemic attack) Active Problems:   Essential hypertension   ATRIAL FIBRILLATION   Hyperlipidemia   Type 2 diabetes mellitus ([place])   [name] is a 85 y.o. female with medical history significant for paroxysmal atrial fibrillation on Coumadin, peripheral vascular disease s/p left SFA angioplasty and stenting, type 2 diabetes, hypertension, hyperlipidemia, GERD, and degenerative disc disease/spinal stenosis who is admitted for CVA work-up.  Transient episodes of slurred speech and left hand weakness: Neurology has seen and recommended work-up for ischemia however feel this may be migraine episodes given history of headache.  Initial CT head without contrast negative for acute changes. -MRI brain and MRA head ordered and pending -Obtain carotid Dopplers -Echocardiogram -Continue aspirin 325 mg daily and home Coumadin -Start atorvastatin 40 mg daily -Allow permissive hypertension for now -Continue neurochecks -PT/OT/SLP eval  Atrial fibrillation: CHA2DS2-VASc Score is 7.  She is on Coumadin and remains in A. fib on admission. -Continue Coumadin -Holding home Toprol-XL  Hypertension: Hypertensive on admission, allowing permissive hypertension up to 220/120.  Will use IV hydralazine as needed and hold home Toprol-XL.  Type 2 diabetes: On metformin 500 mg twice daily as an outpatient. -Check A1c, use sensitive SSI for now while in hospital   Hyperlipidemia: Non-statin therapy as an outpatient.  Start atorvastatin 40 mg daily as above.  PVD: S/p left SFA angioplasty and stenting and amputations of toes on each foot. -Continue aspirin 325  mg, start atorvastatin as above   DVT prophylaxis: Coumadin Code Status: DNR, confirmed with patient Family Communication: None present on admission Disposition Plan: Pending clinical progress Consults called: Neurology Admission status: Observation   Zada Finders MD [facility]  If 7PM-7AM, please contact night-coverage [email or web address]  06/26/2019, 11:59 PM

## 2019-06-26 NOTE — ED Triage Notes (Signed)
Per [facility], Pt has been having intermittent dizziness, and "wavy" vision x 2 weeks. Pt had an episode yesterday with slurred speech, L arm weakness, and L side facial droop that resolved. Pt had 2 similar episodes at 5:15 and 5:45 tonight, that have since resolved.

## 2019-06-26 NOTE — ED Notes (Signed)
Report given to rn on 3  w 

## 2019-06-26 NOTE — ED Notes (Signed)
Could not get a pulse on there sitting portion of ortho's.  Sensor malfunction-changed to adherent SpO2 instead of reusable.

## 2019-06-27 ENCOUNTER — Observation Stay (HOSPITAL_BASED_OUTPATIENT_CLINIC_OR_DEPARTMENT_OTHER): Payer: Medicare Other

## 2019-06-27 DIAGNOSIS — I6603 Occlusion and stenosis of bilateral middle cerebral arteries: Secondary | ICD-10-CM | POA: Diagnosis not present

## 2019-06-27 DIAGNOSIS — I63431 Cerebral infarction due to embolism of right posterior cerebral artery: Secondary | ICD-10-CM

## 2019-06-27 DIAGNOSIS — Z7984 Long term (current) use of oral hypoglycemic drugs: Secondary | ICD-10-CM | POA: Diagnosis not present

## 2019-06-27 DIAGNOSIS — Z66 Do not resuscitate: Secondary | ICD-10-CM | POA: Diagnosis present

## 2019-06-27 DIAGNOSIS — I6623 Occlusion and stenosis of bilateral posterior cerebral arteries: Secondary | ICD-10-CM | POA: Diagnosis not present

## 2019-06-27 DIAGNOSIS — M4800 Spinal stenosis, site unspecified: Secondary | ICD-10-CM | POA: Diagnosis present

## 2019-06-27 DIAGNOSIS — R569 Unspecified convulsions: Secondary | ICD-10-CM | POA: Diagnosis not present

## 2019-06-27 DIAGNOSIS — E785 Hyperlipidemia, unspecified: Secondary | ICD-10-CM

## 2019-06-27 DIAGNOSIS — I6782 Cerebral ischemia: Secondary | ICD-10-CM | POA: Diagnosis not present

## 2019-06-27 DIAGNOSIS — I639 Cerebral infarction, unspecified: Secondary | ICD-10-CM | POA: Diagnosis present

## 2019-06-27 DIAGNOSIS — Z95820 Peripheral vascular angioplasty status with implants and grafts: Secondary | ICD-10-CM | POA: Diagnosis not present

## 2019-06-27 DIAGNOSIS — R4782 Fluency disorder in conditions classified elsewhere: Secondary | ICD-10-CM | POA: Diagnosis present

## 2019-06-27 DIAGNOSIS — I452 Bifascicular block: Secondary | ICD-10-CM | POA: Diagnosis present

## 2019-06-27 DIAGNOSIS — E1165 Type 2 diabetes mellitus with hyperglycemia: Secondary | ICD-10-CM | POA: Diagnosis not present

## 2019-06-27 DIAGNOSIS — I480 Paroxysmal atrial fibrillation: Secondary | ICD-10-CM | POA: Diagnosis not present

## 2019-06-27 DIAGNOSIS — Z96642 Presence of left artificial hip joint: Secondary | ICD-10-CM | POA: Diagnosis present

## 2019-06-27 DIAGNOSIS — R29810 Facial weakness: Secondary | ICD-10-CM | POA: Diagnosis present

## 2019-06-27 DIAGNOSIS — Z8614 Personal history of Methicillin resistant Staphylococcus aureus infection: Secondary | ICD-10-CM | POA: Diagnosis not present

## 2019-06-27 DIAGNOSIS — G5603 Carpal tunnel syndrome, bilateral upper limbs: Secondary | ICD-10-CM | POA: Diagnosis present

## 2019-06-27 DIAGNOSIS — R413 Other amnesia: Secondary | ICD-10-CM | POA: Diagnosis not present

## 2019-06-27 DIAGNOSIS — E1151 Type 2 diabetes mellitus with diabetic peripheral angiopathy without gangrene: Secondary | ICD-10-CM | POA: Diagnosis present

## 2019-06-27 DIAGNOSIS — I10 Essential (primary) hypertension: Secondary | ICD-10-CM | POA: Diagnosis not present

## 2019-06-27 DIAGNOSIS — I69322 Dysarthria following cerebral infarction: Secondary | ICD-10-CM | POA: Diagnosis not present

## 2019-06-27 DIAGNOSIS — E119 Type 2 diabetes mellitus without complications: Secondary | ICD-10-CM | POA: Diagnosis not present

## 2019-06-27 DIAGNOSIS — M069 Rheumatoid arthritis, unspecified: Secondary | ICD-10-CM | POA: Diagnosis present

## 2019-06-27 DIAGNOSIS — M5136 Other intervertebral disc degeneration, lumbar region: Secondary | ICD-10-CM | POA: Diagnosis present

## 2019-06-27 DIAGNOSIS — R7309 Other abnormal glucose: Secondary | ICD-10-CM | POA: Diagnosis not present

## 2019-06-27 DIAGNOSIS — G8194 Hemiplegia, unspecified affecting left nondominant side: Secondary | ICD-10-CM | POA: Diagnosis present

## 2019-06-27 DIAGNOSIS — I340 Nonrheumatic mitral (valve) insufficiency: Secondary | ICD-10-CM | POA: Diagnosis not present

## 2019-06-27 DIAGNOSIS — G459 Transient cerebral ischemic attack, unspecified: Secondary | ICD-10-CM

## 2019-06-27 DIAGNOSIS — Z882 Allergy status to sulfonamides status: Secondary | ICD-10-CM | POA: Diagnosis not present

## 2019-06-27 DIAGNOSIS — R351 Nocturia: Secondary | ICD-10-CM | POA: Diagnosis present

## 2019-06-27 DIAGNOSIS — I69354 Hemiplegia and hemiparesis following cerebral infarction affecting left non-dominant side: Secondary | ICD-10-CM | POA: Diagnosis present

## 2019-06-27 DIAGNOSIS — Z881 Allergy status to other antibiotic agents status: Secondary | ICD-10-CM | POA: Diagnosis not present

## 2019-06-27 DIAGNOSIS — Z89421 Acquired absence of other right toe(s): Secondary | ICD-10-CM | POA: Diagnosis not present

## 2019-06-27 DIAGNOSIS — R4781 Slurred speech: Secondary | ICD-10-CM | POA: Diagnosis present

## 2019-06-27 DIAGNOSIS — Z9849 Cataract extraction status, unspecified eye: Secondary | ICD-10-CM | POA: Diagnosis not present

## 2019-06-27 DIAGNOSIS — Z888 Allergy status to other drugs, medicaments and biological substances status: Secondary | ICD-10-CM | POA: Diagnosis not present

## 2019-06-27 DIAGNOSIS — I63531 Cerebral infarction due to unspecified occlusion or stenosis of right posterior cerebral artery: Secondary | ICD-10-CM | POA: Diagnosis present

## 2019-06-27 DIAGNOSIS — R29700 NIHSS score 0: Secondary | ICD-10-CM | POA: Diagnosis present

## 2019-06-27 DIAGNOSIS — I6389 Other cerebral infarction: Secondary | ICD-10-CM | POA: Diagnosis not present

## 2019-06-27 DIAGNOSIS — K219 Gastro-esophageal reflux disease without esophagitis: Secondary | ICD-10-CM | POA: Diagnosis present

## 2019-06-27 DIAGNOSIS — Z96653 Presence of artificial knee joint, bilateral: Secondary | ICD-10-CM | POA: Diagnosis present

## 2019-06-27 DIAGNOSIS — Z20828 Contact with and (suspected) exposure to other viral communicable diseases: Secondary | ICD-10-CM | POA: Diagnosis present

## 2019-06-27 DIAGNOSIS — I69311 Memory deficit following cerebral infarction: Secondary | ICD-10-CM | POA: Diagnosis not present

## 2019-06-27 DIAGNOSIS — M810 Age-related osteoporosis without current pathological fracture: Secondary | ICD-10-CM | POA: Diagnosis present

## 2019-06-27 DIAGNOSIS — I4891 Unspecified atrial fibrillation: Secondary | ICD-10-CM | POA: Diagnosis not present

## 2019-06-27 DIAGNOSIS — D72829 Elevated white blood cell count, unspecified: Secondary | ICD-10-CM | POA: Diagnosis not present

## 2019-06-27 DIAGNOSIS — Z7901 Long term (current) use of anticoagulants: Secondary | ICD-10-CM | POA: Diagnosis not present

## 2019-06-27 LAB — LIPID PANEL
Cholesterol: 187 mg/dL (ref 0–200)
HDL: 27 mg/dL — ABNORMAL LOW (ref >40)
LDL Cholesterol: 126 mg/dL — ABNORMAL HIGH (ref 0–99)
Total CHOL/HDL Ratio: 6.9 RATIO
Triglycerides: 170 mg/dL — ABNORMAL HIGH (ref <150)
VLDL: 34 mg/dL (ref 0–40)

## 2019-06-27 LAB — GLUCOSE, CAPILLARY
Glucose-Capillary: 158 mg/dL — ABNORMAL HIGH (ref 70–99)
Glucose-Capillary: 124 mg/dL — ABNORMAL HIGH (ref 70–99)

## 2019-06-27 LAB — PROTIME-INR
INR: 1.9 — ABNORMAL HIGH (ref 0.8–1.2)
Prothrombin Time: 21.3 seconds — ABNORMAL HIGH (ref 11.4–15.2)

## 2019-06-27 LAB — ECHOCARDIOGRAM COMPLETE
Height: 63 in
Weight: 1763.68 oz

## 2019-06-27 LAB — HEMOGLOBIN A1C
Hgb A1c MFr Bld: 7.4 % — ABNORMAL HIGH (ref 4.8–5.6)
Mean Plasma Glucose: 165.68 mg/dL

## 2019-06-27 MED ORDER — APIXABAN 2.5 MG PO TABS
2.5000 mg | ORAL_TABLET | Freq: Two times a day (BID) | ORAL | Status: DC
  Administered 2019-06-27 – 2019-06-28 (×3): 2.5 mg via ORAL
  Filled 2019-06-27 (×3): qty 1

## 2019-06-27 MED ORDER — METOPROLOL SUCCINATE ER 25 MG PO TB24
12.5000 mg | ORAL_TABLET | Freq: Every day | ORAL | Status: DC
  Administered 2019-06-27 – 2019-06-28 (×2): 12.5 mg via ORAL
  Filled 2019-06-27 (×2): qty 1

## 2019-06-27 NOTE — Progress Notes (Signed)
PT Cancellation Note  Patient Details Name: [name] MRN: [id number] DOB: July 19, 1928   Cancelled Treatment:    Reason Eval/Treat Not Completed: Fatigue/lethargy limiting ability to participate Patient working with OT with possible new episode noted, too fatigued at this point to evaluate. Will cont to follow.  Reinaldo Berber, PT, DPT Acute Rehabilitation Services Pager: [phone] Office: [phone]     Reinaldo Berber 06/27/2019, 2:54 PM

## 2019-06-27 NOTE — Evaluation (Signed)
Speech Language Pathology Evaluation Patient Details Name: [name] MRN: [id number] DOB: 1928/07/29 Today's Date: 06/27/2019 Time: 7829-5621 SLP Time Calculation (min) (ACUTE ONLY): 8 min  Problem List:  Patient Active Problem List   Diagnosis Date Noted  . CVA (cerebral vascular accident) ([place]) 06/27/2019  . TIA (transient ischemic attack) 06/26/2019  . Hyperlipidemia   . Type 2 diabetes mellitus ([place])   . Degeneration of lumbar intervertebral disc 12/11/2017  . Cough   . Hematoma   . Groin swelling 04/12/2017  . Hematoma of groin 04/12/2017  . Critical lower limb ischemia 04/08/2017  . Long term (current) use of anticoagulants [Z79.01] 12/13/2015  . Closed dislocation of tarsometatarsal joint 10/05/2015  . ULCER OF OTHER PART OF FOOT 06/06/2010  . Essential hypertension 03/26/2010  . ATRIAL FIBRILLATION 03/26/2010  . CUTANEOUS ERUPTIONS, DRUG-INDUCED 03/26/2010  . DIARRHEA 03/26/2010  . GERD 03/20/2010  . UNSPEC LOCAL INFECTION SKIN&SUBCUTANEOUS TISSUE 03/20/2010  . OSTEOARTHRITIS 03/20/2010  . ARTHRITIS, SEPTIC 03/14/2010  . OTH COMPLICATIONS DUE INTERNAL JOINT PROSTHESIS 02/22/2010   Past Medical History:  Past Medical History:  Diagnosis Date  . Acute osteomyelitis ([place]) 08/25/2018   Right toe  . Anemia   . Atrial fibrillation ([place])   . Carpal tunnel syndrome, bilateral   . Cellulitis 08/25/2018   Right foot  . Cervical lymphadenopathy   . Compression fracture of fifth lumbar vertebra (HCC)    to T 11  . DDD (degenerative disc disease), lumbar   . DJD (degenerative joint disease)   . DOE (dyspnea on exertion)   . Dysphagia   . Dyspnea    with exertion  . Dysrhythmia    hx brief AF post op hip surg  . Esophageal dysmotility 01/31/2014   noted barium  . GERD (gastroesophageal reflux disease)   . Hematoma    right side after stent placement  . History of hiatal hernia 01/31/2014   Small, noted on Barium  . Hyperlipidemia   . Hypertension   .  Jaundice   . Low back pain   . MRSA (methicillin resistant Staphylococcus aureus)   . Neuropathy   . Nocturia   . OA (osteoarthritis)   . Osteoporosis   . Ovarian cyst   . PAD (peripheral artery disease) ([place])   . Peripheral vascular disease ([place])   . Pleural effusion, left 04/17/2017   Small  . RA (rheumatoid arthritis) ([place])   . Spinal stenosis   . Toe ulcer, right, with necrosis of bone ([place]) 08/25/2018  . Type 2 diabetes mellitus ([place])   . Unsteady gait   . Urinary incontinence   . Uterine polyp   . Varicose vein of leg    Bilateral   Past Surgical History:  Past Surgical History:  Procedure Laterality Date  . AMPUTATION Right 02/04/2013   Procedure: [name] ;  Surgeon: Wylene Simmer, MD;  Location: [facility];  Service: Orthopedics;  Laterality: Right;  . AMPUTATION TOE Right 09/03/2018   Procedure: AMPUTATION 3RD RIGHT TOE INTERPHALANGEAL;  Surgeon: [name], DPM;  [facility];  Service: Podiatry;  Laterality: Right;  . APPENDECTOMY    . COLONOSCOPY    . EYE SURGERY     both cataracts  . FEMORAL ARTERY EXPLORATION Right 04/12/2017   Procedure: EVACUATION HEMATOMA RIGHT GROIN WITH FEMORAL ARTERY REPAIR;  Surgeon: Rosetta Posner, MD;  Location: [facility];  Service: Vascular;  Laterality: Right;  . INCISION AND DRAINAGE  2011   left hip inf-hemovac  .  IR ANGIOGRAM EXTREMITY LEFT  03/25/2017  . IR ANGIOGRAM EXTREMITY LEFT  04/08/2017  . IR ANGIOGRAM SELECTIVE EACH ADDITIONAL VESSEL  03/25/2017  . IR FEM POP ART STENT INC PTA MOD SED  03/25/2017  . IR INFUSION THROMBOL ARTERIAL INITIAL (MS)  04/08/2017  . IR RADIOLOGIST EVAL & MGMT  02/26/2017  . IR RADIOLOGIST EVAL & MGMT  04/03/2017  . IR RADIOLOGIST EVAL & MGMT  08/20/2017  . IR RADIOLOGIST EVAL & MGMT  01/22/2018  . IR RADIOLOGIST EVAL & MGMT  01/13/2019  . IR THROMB F/U EVAL ART/VEN FINAL DAY (MS)  04/09/2017  . IR TIB-PERO ART PTA MOD SED  03/25/2017  . IR US GUIDE VASC ACCESS RIGHT  03/25/2017   . IR US GUIDE VASC ACCESS RIGHT  04/08/2017  . KNEE ARTHROSCOPY  1995   left  . NECK MASS EXCISION    . TONSILLECTOMY    . TOTAL HIP ARTHROPLASTY  2006   left-fx  . TOTAL KNEE ARTHROPLASTY  [id number]   left  . TOTAL KNEE ARTHROPLASTY  2009   right   HPI:  [name] is a 85 y.o. female with medical history significant for paroxysmal atrial fibrillation on Coumadin, peripheral vascular disease s/p left SFA angioplasty and stenting, type 2 diabetes, hypertension, hyperlipidemia, GERD, osteoarthritis, and degenerative disc disease/spinal stenosis who presents to the ED for evaluation of transient episodes of slurred speech, left hand weakness. MRI revealed 2-3 small foci of acute to early subacute ischemia within the posterior right temporal lobe, within the PCA territory.   Assessment / Plan / Recommendation Clinical Impression  Pt's cognitive linguistic abilities are within functional limits as she is oriented x 4 and reports that all symptoms have resolved at this time (except for some weakness in left hand - nursing aware - OT evaluation order already in place). Pt's speech intelligilibity is good. All education has been completed, ST to sign off at this time.     SLP Assessment  SLP Recommendation/Assessment: Patient does not need any further Speech Lanaguage Pathology Services SLP Visit Diagnosis: Cognitive communication deficit (R41.841)    Follow Up Recommendations  None          SLP Evaluation Cognition  Overall Cognitive Status: Within Functional Limits for tasks assessed Arousal/Alertness: Awake/alert Orientation Level: Oriented X4       Comprehension  Auditory Comprehension Overall Auditory Comprehension: Appears within functional limits for tasks assessed Visual Recognition/Discrimination Discrimination: Within Function Limits Reading Comprehension Reading Status: Not tested    Expression Expression Primary Mode of Expression: Verbal Verbal  Expression Overall Verbal Expression: Appears within functional limits for tasks assessed Written Expression Dominant Hand: Right Written Expression: Not tested   Oral / Motor  Oral Motor/Sensory Function Overall Oral Motor/Sensory Function: Within functional limits Motor Speech Overall Motor Speech: Appears within functional limits for tasks assessed Respiration: Within functional limits Phonation: Normal Resonance: Within functional limits Articulation: Within functional limitis Intelligibility: Intelligible Motor Planning: Witnin functional limits Motor Speech Errors: Not applicable   GO                    Fraser Busche 06/27/2019, 10:58 AM

## 2019-06-27 NOTE — Progress Notes (Signed)
PROGRESS NOTE    [name]  ZHY:[id number] DOB: 04/13/1928 DOA: 06/26/2019 PCP: Leanna Battles, MD    Brief Narrative:  HPI per Dr. Christie Beckers is a 85 y.o. female with medical history significant for paroxysmal atrial fibrillation on Coumadin, peripheral vascular disease s/p left SFA angioplasty and stenting, type 2 diabetes, hypertension, hyperlipidemia, GERD, osteoarthritis, and degenerative disc disease/spinal stenosis who presents to the ED for evaluation of transient episodes of slurred speech, left hand weakness.  Patient states that over the last 2 days she has had 3 episodes of transient slurred speech, left hand weakness, and left-sided facial droop.  These episodes lasted about 5 minutes before resolving.  These episodes were witnessed once by her hairdresser and the other times by her family with last episode earlier today around 5:45 PM.  She has also noticed some "wavy" vision intermittently over the last week.  She also reports intermittent headaches.  She denies any chest pain, palpitations, dyspnea, headache, nausea, vomiting, difficulty with swallowing, abdominal pain, dysuria, diarrhea, constipation, falls, or loss of consciousness.  Patient is taking Coumadin for atrial fibrillation and is on aspirin 325 mg for history of peripheral vascular disease.  She denies any obvious bleeding including epistaxis, hematemesis, hemoptysis, hematochezia, materia, or melena.  ED Course:  Initial vitals showed BP 143/95, pulse 74, RR 16, temp 98.2 Fahrenheit, SPO2 96% on room air.  Repeat blood pressure showed hypertension up to 213/112.  Labs notable for WBC 8.1, hemoglobin 17.6, platelets 197,000, INR 2.0, sodium 140, potassium 4.5, bicarb 26, BUN 21, creatinine 0.77, serum glucose 148.  SARS-CoV-2 test was negative.  CT head without contrast was negative for acute intracranial abnormalities.  Neurology were consulted and recommended further ischemic work-up with  differential diagnosis including migraines.  The hospitalist service was consulted to admit for further evaluation and management.  Assessment & Plan:   Principal Problem:   CVA (cerebral vascular accident) ([place]) Active Problems:   Essential hypertension   ATRIAL FIBRILLATION   TIA (transient ischemic attack)   Hyperlipidemia   Type 2 diabetes mellitus ([place])   1 acute CVA Patient had presented with transient speech difficulties, left upper extremity weakness and numbness and confusion with multiple episodes.  Patient did not receive TPA due to resolution of symptoms.  Head CT which was done was unremarkable.  MRI head done/MRA head done showed 2-3 small foci of acute to early subacute ischemia within the posterior right temporal lobe within the PCA territory.  MRA head with short segment occlusions of both posterior cerebral arteries, P2 to 3 junction on the right and proximal P2 segment on the left.  Short segment occlusion of the right MCA M1 segment, just proximal to the bifurcation, with normal M2 branches.  Moderate stenosis of the left M2 inferior division.  Carotid Dopplers done with no significant ICA stenosis.  2D echo done with a EF of 55 to 60% with no source of emboli noted.  Fasting lipid panel done with LDL of 126.  Patient seen by speech therapy, PT/OT.  Neurology was consulted and followed the patient throughout the hospitalization.  Neurology had recommended discontinuation of Coumadin and starting patient on Eliquis as patient noted to have 1.9.  Patient also recommended to be on aspirin daily.  Per neurology.  2.  Hypertension Permissive hypertension secondary to problem #1.  We will start patient on half home dose Toprol-XL for rate control for history of atrial fibrillation.  3.  Hyperlipidemia LDL of 126.  Patient  on Lipitor 40 mg daily.  4.  Diabetes mellitus type 2 Hemoglobin A1c 7.4.  Continue to hold metformin and could resume on discharge.  Sliding scale insulin  during this hospitalization.  5.  Peripheral vascular disease Status post left SFA angioplasty and stenting and amputations of toes on each feet.  Continue aspirin.  Patient started on statin.  6.  Chronic atrial fibrillation Chads vas score of 7.  Patient was on Coumadin prior to admission.  Neurology recommending patient be switched to Eliquis and as such patient started on Eliquis.  Outpatient follow-up.   DVT prophylaxis: Eliquis Code Status: DNR Family Communication: Updated patient.  No family at bedside. Disposition Plan: To be determined.  Likely home with home health when okay with neurology.   Consultants:   Neurology: Dr. Lorraine Lax 06/26/2019  Procedures:   Carotid ultrasound 06/27/2019  2D echo 06/27/2019  CT head 06/26/2019  MRI head/MR angiogram head 06/27/2019  Antimicrobials:   None   Subjective: Patient lying in bed.  Denies any chest pain or shortness of breath.  Slurred speech improved.  Patient states there was some left-sided weakness however has improved since admission.   Objective: Vitals:   06/27/19 0700 06/27/19 0749 06/27/19 0950 06/27/19 1215  BP: (!) 165/85 (!) 161/89 (!) 158/86 (!) 175/102  Pulse:  81 76 87  Resp: 16 16 18 16   Temp:  97.8 F (36.6 C) 98 F (36.7 C) (!) 97.4 F (36.3 C)  TempSrc:  Oral Oral Oral  SpO2: 99% 99% 100%   Weight:      Height:        Intake/Output Summary (Last 24 hours) at 06/27/2019 1335 Last data filed at 06/27/2019 0400 Gross per 24 hour  Intake --  Output 500 ml  Net -500 ml   Filed Weights   06/26/19 2237  Weight: 50 kg    Examination:  General exam: Appears calm and comfortable. Respiratory system: Clear to auscultation. Respiratory effort normal. Cardiovascular system: Irregularly irregular.Marland Kitchen No JVD, murmurs, rubs, gallops or clicks. No pedal edema. Gastrointestinal system: Abdomen is nondistended, soft and nontender. No organomegaly or masses felt. Normal bowel sounds heard. Central nervous system:  Alert and oriented. No focal neurological deficits. Extremities: Some left upper extremity weakness.  Skin: No rashes, lesions or ulcers Psychiatry: Judgement and insight appear normal. Mood & affect appropriate.     Data Reviewed: I have personally reviewed following labs and imaging studies  CBC: Recent Labs  Lab 06/26/19 1958  WBC 8.1  NEUTROABS 5.8  HGB 17.6*  HCT 53.2*  MCV 97.1  PLT 782   Basic Metabolic Panel: Recent Labs  Lab 06/26/19 1958  NA 140  K 4.5  CL 104  CO2 26  GLUCOSE 148*  BUN 21  CREATININE 0.77  CALCIUM 9.9   GFR: Estimated Creatinine Clearance: 38.4 mL/min (by C-G formula based on SCr of 0.77 mg/dL). Liver Function Tests: Recent Labs  Lab 06/26/19 1958  AST 21  ALT 20  ALKPHOS 66  BILITOT 0.9  PROT 7.4  ALBUMIN 4.1   No results for input(s): LIPASE, AMYLASE in the last 168 hours. No results for input(s): AMMONIA in the last 168 hours. Coagulation Profile: Recent Labs  Lab 06/26/19 1958 06/27/19 0411  INR 2.0* 1.9*   Cardiac Enzymes: No results for input(s): CKTOTAL, CKMB, CKMBINDEX, TROPONINI in the last 168 hours. BNP (last 3 results) No results for input(s): PROBNP in the last 8760 hours. HbA1C: Recent Labs    06/27/19 0411  HGBA1C  7.4*   CBG: No results for input(s): GLUCAP in the last 168 hours. Lipid Profile: Recent Labs    06/27/19 0411  CHOL 187  HDL 27*  LDLCALC 126*  TRIG 170*  CHOLHDL 6.9   Thyroid Function Tests: No results for input(s): TSH, T4TOTAL, FREET4, T3FREE, THYROIDAB in the last 72 hours. Anemia Panel: No results for input(s): VITAMINB12, FOLATE, FERRITIN, TIBC, IRON, RETICCTPCT in the last 72 hours. Sepsis Labs: No results for input(s): PROCALCITON, LATICACIDVEN in the last 168 hours.  Recent Results (from the past 240 hour(s))  SARS Coronavirus 2 [facility] order, Performed in [facility] hospital lab) Nasopharyngeal Nasopharyngeal Swab     Status: None   Collection Time: 06/26/19  9:40  PM   Specimen: Nasopharyngeal Swab  Result Value Ref Range Status   SARS Coronavirus 2 NEGATIVE NEGATIVE Final    Comment: (NOTE) If result is NEGATIVE SARS-CoV-2 target nucleic acids are NOT DETECTED. The SARS-CoV-2 RNA is generally detectable in upper and lower  respiratory specimens during the acute phase of infection. The lowest  concentration of SARS-CoV-2 viral copies this assay can detect is 250  copies / mL. A negative result does not preclude SARS-CoV-2 infection  and should not be used as the sole basis for treatment or other  patient management decisions.  A negative result may occur with  improper specimen collection / handling, submission of specimen other  than nasopharyngeal swab, presence of viral mutation(s) within the  areas targeted by this assay, and inadequate number of viral copies  (<250 copies / mL). A negative result must be combined with clinical  observations, patient history, and epidemiological information. If result is POSITIVE SARS-CoV-2 target nucleic acids are DETECTED. The SARS-CoV-2 RNA is generally detectable in upper and lower  respiratory specimens dur ing the acute phase of infection.  Positive  results are indicative of active infection with SARS-CoV-2.  Clinical  correlation with patient history and other diagnostic information is  necessary to determine patient infection status.  Positive results do  not rule out bacterial infection or co-infection with other viruses. If result is PRESUMPTIVE POSTIVE SARS-CoV-2 nucleic acids MAY BE PRESENT.   A presumptive positive result was obtained on the submitted specimen  and confirmed on repeat testing.  While 2019 novel coronavirus  (SARS-CoV-2) nucleic acids may be present in the submitted sample  additional confirmatory testing may be necessary for epidemiological  and / or clinical management purposes  to differentiate between  SARS-CoV-2 and other Sarbecovirus currently known to infect humans.    If clinically indicated additional testing with an alternate test  methodology [phone]) is advised. The SARS-CoV-2 RNA is generally  detectable in upper and lower respiratory sp ecimens during the acute  phase of infection. The expected result is Negative. Fact Sheet for Patients:  StrictlyIdeas.no Fact Sheet for Healthcare Providers: BankingDealers.co.za This test is not yet approved or cleared by the [place] FDA and has been authorized for detection and/or diagnosis of SARS-CoV-2 by FDA under an Emergency Use Authorization (EUA).  This EUA will remain in effect (meaning this test can be used) for the duration of the COVID-19 declaration under Section 564(b)(1) of the Act, 21 U.S.C. section 360bbb-3(b)(1), unless the authorization is terminated or revoked sooner. Performed at [facility], [place] [address] [place], [place] [address]          Radiology Studies: Ct Head Wo Contrast  Result Date: 06/26/2019 CLINICAL DATA:  Tingling in left hand. EXAM: CT HEAD WITHOUT CONTRAST  TECHNIQUE: Contiguous axial images were obtained from the base of the skull through the vertex without intravenous contrast. COMPARISON:  October 13, 2007 FINDINGS: Brain: No subdural, epidural, or subarachnoid hemorrhage. Ventricles and sulci are unremarkable. Cerebellum, brainstem, and basal cisterns are normal. Mild white matter changes identified. No acute cortical ischemia or infarct is noted. No mass effect or midline shift is identified. Vascular: Calcified atherosclerosis is seen in the intracranial carotids. Skull: Normal. Negative for fracture or focal lesion. Sinuses/Orbits: Opacification of the right frontal sinus is identified. Paranasal sinuses, mastoid air cells, and middle ears are otherwise normal. Other: None. IMPRESSION: 1. No acute intracranial abnormalities are identified. Electronically Signed   By: Dorise Bullion III M.D   On:  06/26/2019 19:55   Mr Angio Head Wo Contrast  Result Date: 06/27/2019 CLINICAL DATA:  Left-sided facial droop and slurred speech with left hand weakness. Three episodes in last 2 days. EXAM: MRI HEAD WITHOUT CONTRAST MRA HEAD WITHOUT CONTRAST TECHNIQUE: Multiplanar, multiecho pulse sequences of the brain and surrounding structures were obtained without intravenous contrast. Angiographic images of the head were obtained using MRA technique without contrast. COMPARISON:  Head CT 06/26/2019 Brain MRI 09/24/2009 FINDINGS: MRI HEAD FINDINGS BRAIN: There are 2-3 small areas of abnormal diffusion restriction within the posterior right temporal lobe (series 5, image 63). The white matter signal is normal for the patient's age. The cerebral and cerebellar volume are age-appropriate. There is no hydrocephalus. The midline structures are normal. VASCULAR: There are 2 scattered foci of chronic microhemorrhage in the supratentorial brain. The major intracranial arterial and venous sinus flow voids are normal. SKULL AND UPPER CERVICAL SPINE: Calvarial bone marrow signal is normal. There is no skull base mass. The visualized upper cervical spine and soft tissues are normal. SINUSES/ORBITS: There are no fluid levels or advanced mucosal thickening. The mastoid air cells and middle ear cavities are free of fluid. The orbits are normal. MRA HEAD FINDINGS POSTERIOR CIRCULATION: --Vertebral arteries: Normal V4 segments. --Posterior inferior cerebellar arteries (PICA): The left PICA and AICA share a common origin. Normal origin of right PICA from the ipsilateral vertebral artery. --Anterior inferior cerebellar arteries (AICA): Patent origins from the basilar artery. --Basilar artery: Normal. --Superior cerebellar arteries: Normal. --Posterior cerebral arteries (PCA): The right PCA is occluded at the P2-3 junction with distal reconstitution. The left PCA is occluded as short segment of the proximal P2 segment. The distal left PCA is  patent. ANTERIOR CIRCULATION: --Intracranial internal carotid arteries: Normal. --Anterior cerebral arteries (ACA): Multifocal stenosis of the left anterior cerebral artery with areas of intermittent loss of normal flow related enhancement. --Middle cerebral arteries (MCA): Short segment loss of enhancement of the right middle cerebral artery distal M1 segment. The M2 branches are normal. The left M1 segment is normal. There is moderate stenosis of the left M2 inferior division. IMPRESSION: 1. 2-3 small foci of acute to early subacute ischemia within the posterior right temporal lobe, within the PCA territory. 2. 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 3. Short segment occlusion of the right MCA M1 segment, just proximal to the bifurcation, with normal M2 branches. 4. Moderate stenosis of the left M2 inferior division. Electronically Signed   By: Ulyses Jarred M.D.   On: 06/27/2019 00:42   Mr Brain Wo Contrast  Result Date: 06/27/2019 CLINICAL DATA:  Left-sided facial droop and slurred speech with left hand weakness. Three episodes in last 2 days. EXAM:  MRI HEAD WITHOUT CONTRAST MRA HEAD WITHOUT CONTRAST TECHNIQUE: Multiplanar, multiecho pulse sequences of the brain and surrounding structures were obtained without intravenous contrast. Angiographic images of the head were obtained using MRA technique without contrast. COMPARISON:  Head CT 06/26/2019 Brain MRI 09/24/2009 FINDINGS: MRI HEAD FINDINGS BRAIN: There are 2-3 small areas of abnormal diffusion restriction within the posterior right temporal lobe (series 5, image 63). The white matter signal is normal for the patient's age. The cerebral and cerebellar volume are age-appropriate. There is no hydrocephalus. The midline structures are normal. VASCULAR: There are 2 scattered foci of chronic microhemorrhage in the supratentorial brain. The major intracranial arterial and  venous sinus flow voids are normal. SKULL AND UPPER CERVICAL SPINE: Calvarial bone marrow signal is normal. There is no skull base mass. The visualized upper cervical spine and soft tissues are normal. SINUSES/ORBITS: There are no fluid levels or advanced mucosal thickening. The mastoid air cells and middle ear cavities are free of fluid. The orbits are normal. MRA HEAD FINDINGS POSTERIOR CIRCULATION: --Vertebral arteries: Normal V4 segments. --Posterior inferior cerebellar arteries (PICA): The left PICA and AICA share a common origin. Normal origin of right PICA from the ipsilateral vertebral artery. --Anterior inferior cerebellar arteries (AICA): Patent origins from the basilar artery. --Basilar artery: Normal. --Superior cerebellar arteries: Normal. --Posterior cerebral arteries (PCA): The right PCA is occluded at the P2-3 junction with distal reconstitution. The left PCA is occluded as short segment of the proximal P2 segment. The distal left PCA is patent. ANTERIOR CIRCULATION: --Intracranial internal carotid arteries: Normal. --Anterior cerebral arteries (ACA): Multifocal stenosis of the left anterior cerebral artery with areas of intermittent loss of normal flow related enhancement. --Middle cerebral arteries (MCA): Short segment loss of enhancement of the right middle cerebral artery distal M1 segment. The M2 branches are normal. The left M1 segment is normal. There is moderate stenosis of the left M2 inferior division. IMPRESSION: 1. 2-3 small foci of acute to early subacute ischemia within the posterior right temporal lobe, within the PCA territory. 2. 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 3. Short segment occlusion of the right MCA M1 segment, just proximal to the bifurcation, with normal M2 branches. 4. Moderate stenosis of the left M2 inferior division. Electronically Signed   By: Ulyses Jarred M.D.   On:  06/27/2019 00:42   Vas US Carotid (at [place] Only)  Result Date: 06/27/2019 Carotid Arterial Duplex Study Indications:       TIA. Risk Factors:      Hypertension, hyperlipidemia, Diabetes, PAD. Comparison Study:  no prior Performing Technologist: Abram Sander RVS  Examination Guidelines: A complete evaluation includes B-mode imaging, spectral Doppler, color Doppler, and power Doppler as needed of all accessible portions of each vessel. Bilateral testing is considered an integral part of a complete examination. Limited examinations for reoccurring indications may be performed as noted.  Right Carotid Findings: +----------+--------+--------+--------+------------------------+--------+             PSV cm/s EDV cm/s Stenosis Describe                 Comments  +----------+--------+--------+--------+------------------------+--------+  CCA Prox   48       10                calcific and homogeneous           +----------+--------+--------+--------+------------------------+--------+  CCA Distal 38  11                homogeneous                        +----------+--------+--------+--------+------------------------+--------+  ICA Prox   31       11       1-39%    homogeneous                        +----------+--------+--------+--------+------------------------+--------+  ICA Distal 36       11                                                   +----------+--------+--------+--------+------------------------+--------+  ECA        42                                                            +----------+--------+--------+--------+------------------------+--------+ +----------+--------+-------+--------+-------------------+             PSV cm/s EDV cms Describe Arm Pressure (mmHG)  +----------+--------+-------+--------+-------------------+  Subclavian 45                                             +----------+--------+-------+--------+-------------------+ +---------+--------+--+--------+--+---------+  Vertebral PSV  cm/s 42 EDV cm/s 13 Antegrade  +---------+--------+--+--------+--+---------+  Left Carotid Findings: +----------+--------+--------+--------+--------+--------+             PSV cm/s EDV cm/s Stenosis Describe Comments  +----------+--------+--------+--------+--------+--------+  CCA Prox   46       8                 calcific           +----------+--------+--------+--------+--------+--------+  CCA Distal 58       16                calcific           +----------+--------+--------+--------+--------+--------+  ICA Prox   59       13       1-39%    calcific           +----------+--------+--------+--------+--------+--------+  ICA Distal 45       17                                   +----------+--------+--------+--------+--------+--------+  ECA        74       11                                   +----------+--------+--------+--------+--------+--------+ +----------+--------+--------+--------+-------------------+  Subclavian PSV cm/s EDV cm/s Describe Arm Pressure (mmHG)  +----------+--------+--------+--------+-------------------+             66                                              +----------+--------+--------+--------+-------------------+ +---------+--------+--+--------+-+---------+  Vertebral PSV cm/s 30 EDV cm/s 8 Antegrade  +---------+--------+--+--------+-+---------+  Summary: Right Carotid: Velocities in the right ICA are consistent with a 1-39% stenosis. Left Carotid: Velocities in the left ICA are consistent with a 1-39% stenosis. Vertebrals: Bilateral vertebral arteries demonstrate antegrade flow. *See table(s) above for measurements and observations.  Electronically signed by Harold Barban MD on 06/27/2019 at 12:29:25 PM.    Final         Scheduled Meds:  aspirin  300 mg Rectal Daily   Or   aspirin  325 mg Oral Daily   atorvastatin  40 mg Oral q1800   insulin aspart  0-9 Units Subcutaneous TID WC   [START ON 07/01/2019] warfarin  1 mg Oral Q Thu-1800   warfarin  2 mg Oral Once per day on Sun  Mon Tue Wed Fri Sat   Warfarin - Pharmacist Dosing Inpatient   Does not apply q1800   Continuous Infusions:   LOS: 0 days    Time spent: 35 minutes    Irine Seal, MD [facility]  If 7PM-7AM, please contact night-coverage [email or web address] 06/27/2019, 1:35 PM

## 2019-06-27 NOTE — Evaluation (Signed)
Clinical/Bedside Swallow Evaluation Patient Details  Name: [name] MRN: [id number] Date of Birth: August 03, 1928  Today's Date: 06/27/2019 Time: SLP Start Time (ACUTE ONLY): 1010 SLP Stop Time (ACUTE ONLY): 1019 SLP Time Calculation (min) (ACUTE ONLY): 9 min  Past Medical History:  Past Medical History:  Diagnosis Date  . Acute osteomyelitis ([place]) 08/25/2018   Right toe  . Anemia   . Atrial fibrillation ([place])   . Carpal tunnel syndrome, bilateral   . Cellulitis 08/25/2018   Right foot  . Cervical lymphadenopathy   . Compression fracture of fifth lumbar vertebra (HCC)    to T 11  . DDD (degenerative disc disease), lumbar   . DJD (degenerative joint disease)   . DOE (dyspnea on exertion)   . Dysphagia   . Dyspnea    with exertion  . Dysrhythmia    hx brief AF post op hip surg  . Esophageal dysmotility 01/31/2014   noted barium  . GERD (gastroesophageal reflux disease)   . Hematoma    right side after stent placement  . History of hiatal hernia 01/31/2014   Small, noted on Barium  . Hyperlipidemia   . Hypertension   . Jaundice   . Low back pain   . MRSA (methicillin resistant Staphylococcus aureus)   . Neuropathy   . Nocturia   . OA (osteoarthritis)   . Osteoporosis   . Ovarian cyst   . PAD (peripheral artery disease) ([place])   . Peripheral vascular disease ([place])   . Pleural effusion, left 04/17/2017   Small  . RA (rheumatoid arthritis) ([place])   . Spinal stenosis   . Toe ulcer, right, with necrosis of bone ([place]) 08/25/2018  . Type 2 diabetes mellitus ([place])   . Unsteady gait   . Urinary incontinence   . Uterine polyp   . Varicose vein of leg    Bilateral   Past Surgical History:  Past Surgical History:  Procedure Laterality Date  . AMPUTATION Right 02/04/2013   Procedure: [name] ;  Surgeon: Wylene Simmer, MD;  Location: [facility];  Service: Orthopedics;  Laterality: Right;  . AMPUTATION TOE Right 09/03/2018   Procedure:  AMPUTATION 3RD RIGHT TOE INTERPHALANGEAL;  Surgeon: [name], DPM;  Location: [facility];  Service: Podiatry;  Laterality: Right;  . APPENDECTOMY    . COLONOSCOPY    . EYE SURGERY     both cataracts  . FEMORAL ARTERY EXPLORATION Right 04/12/2017   Procedure: EVACUATION HEMATOMA RIGHT GROIN WITH FEMORAL ARTERY REPAIR;  Surgeon: Rosetta Posner, MD;  Location: [facility];  Service: Vascular;  Laterality: Right;  . INCISION AND DRAINAGE  2011   left hip inf-hemovac  . IR ANGIOGRAM EXTREMITY LEFT  03/25/2017  . IR ANGIOGRAM EXTREMITY LEFT  04/08/2017  . IR ANGIOGRAM SELECTIVE EACH ADDITIONAL VESSEL  03/25/2017  . IR FEM POP ART STENT INC PTA MOD SED  03/25/2017  . IR INFUSION THROMBOL ARTERIAL INITIAL (MS)  04/08/2017  . IR RADIOLOGIST EVAL & MGMT  02/26/2017  . IR RADIOLOGIST EVAL & MGMT  04/03/2017  . IR RADIOLOGIST EVAL & MGMT  08/20/2017  . IR RADIOLOGIST EVAL & MGMT  01/22/2018  . IR RADIOLOGIST EVAL & MGMT  01/13/2019  . IR THROMB F/U EVAL ART/VEN FINAL DAY (MS)  04/09/2017  . IR TIB-PERO ART PTA MOD SED  03/25/2017  . IR US GUIDE VASC ACCESS RIGHT  03/25/2017  . IR US GUIDE VASC ACCESS RIGHT  04/08/2017  .  KNEE ARTHROSCOPY  1995   left  . NECK MASS EXCISION    . TONSILLECTOMY    . TOTAL HIP ARTHROPLASTY  2006   left-fx  . TOTAL KNEE ARTHROPLASTY  [id number]   left  . TOTAL KNEE ARTHROPLASTY  2009   right   HPI:  [name] is a 85 y.o. female with medical history significant for paroxysmal atrial fibrillation on Coumadin, peripheral vascular disease s/p left SFA angioplasty and stenting, type 2 diabetes, hypertension, hyperlipidemia, GERD, osteoarthritis, and degenerative disc disease/spinal stenosis who presents to the ED for evaluation of transient episodes of slurred speech, left hand weakness. MRI revealed 2-3 small foci of acute to early subacute ischemia within the posterior right temporal lobe, within the PCA territory.   Assessment / Plan / Recommendation Clinical Impression  Pt appears at  reduced risk of aspiration when following general aspiration precautions. Pt ocnsumed trials of regular diet textures as well as thin liquids via straw (12 oz) with no s/s of aspiration or oropharyngeal dysphagia. Pt doesn't report any history of dypshagia or pneumonia. Recommend upgrading diet to regular with thin liquids, medicine whole. All education has been completed. ST to sign off at this time.  SLP Visit Diagnosis: Dysphagia, unspecified (R13.10)    Aspiration Risk  No limitations    Diet Recommendation Regular;Thin liquid   Liquid Administration via: Cup;Straw Medication Administration: Whole meds with liquid Supervision: Patient able to self feed Compensations: Minimize environmental distractions;Slow rate;Small sips/bites Postural Changes: Seated upright at 90 degrees    Other  Recommendations Oral Care Recommendations: Oral care BID   Follow up Recommendations None        Swallow Study   General Date of Onset: 06/26/19 HPI: [name] is a 85 y.o. female with medical history significant for paroxysmal atrial fibrillation on Coumadin, peripheral vascular disease s/p left SFA angioplasty and stenting, type 2 diabetes, hypertension, hyperlipidemia, GERD, osteoarthritis, and degenerative disc disease/spinal stenosis who presents to the ED for evaluation of transient episodes of slurred speech, left hand weakness. MRI revealed 2-3 small foci of acute to early subacute ischemia within the posterior right temporal lobe, within the PCA territory. Type of Study: Bedside Swallow Evaluation Previous Swallow Assessment: none in chart Diet Prior to this Study: NPO Temperature Spikes Noted: No Respiratory Status: Room air History of Recent Intubation: No Behavior/Cognition: Alert;Cooperative;Pleasant mood Oral Cavity Assessment: Within Functional Limits Oral Care Completed by SLP: No Oral Cavity - Dentition: Adequate natural dentition Vision: Functional for  self-feeding Self-Feeding Abilities: Able to feed self Patient Positioning: Upright in bed Baseline Vocal Quality: Normal Volitional Cough: Strong Volitional Swallow: Able to elicit    Oral/Motor/Sensory Function Overall Oral Motor/Sensory Function: Within functional limits   Ice Chips Ice chips: Not tested   Thin Liquid Thin Liquid: Within functional limits Presentation: Straw;Self Fed    Nectar Thick Nectar Thick Liquid: Not tested   Honey Thick Honey Thick Liquid: Not tested   Puree Puree: Within functional limits Presentation: Self Fed;Spoon   Solid     Solid: Within functional limits Presentation: [place] 06/27/2019,11:11 AM

## 2019-06-27 NOTE — Progress Notes (Signed)
Carotid duplex has been completed.   Preliminary results in CV Proc.   Abram Sander 06/27/2019 10:48 AM

## 2019-06-27 NOTE — Evaluation (Signed)
Occupational Therapy Evaluation Patient Details Name: [name] MRN: [id number] DOB: 11-16-1928 Today's Date: 06/27/2019    History of Present Illness This 85 y.o. female admitted with transient slurred speech, Lt UE numbness and weakness which subsequently improved.  MRI of the brain revealed 213 foci of acute to early subacute ischemia within the posterior Rt temporal lobe within the PCA terrirtory.  PMH includes: A-Fib, DM, RA, PAD   Clinical Impression   Pt admitted with above. She demonstrates the below listed deficits and will benefit from continued OT to maximize safety and independence with BADLs.  Pt presents to OT with generalized weakness, decreased activity tolerance, impaired balance, and deficits with problem solving during functional tasks.  While attempting visual testing, pt demonstrated Rt gaze preference, and was unable to locate objects presented on her Lt.  When asked to identify her Lt UE/LE, she repeated raised her Rt extremities with no awareness, pt returned to supine - RN present and MD notified.   She currently requires min guard assist to mod A for ADLs and min A for functional mobility. She reports she lives with her spouse who can provide assist at discharge.  Recommend CIR.       Follow Up Recommendations  CIR;Supervision/Assistance - 24 hour    Equipment Recommendations  3 in 1 bedside commode    Recommendations for Other Services Rehab consult     Precautions / Restrictions Precautions Precautions: Fall      Mobility Bed Mobility Overal bed mobility: Needs Assistance Bed Mobility: Supine to Sit;Sit to Supine     Supine to sit: Min assist Sit to supine: Min assist   General bed mobility comments: requires assist to lift trunk and assist to lift LEs onto bed   Transfers Overall transfer level: Needs assistance   Transfers: Sit to/from Stand;Stand Pivot Transfers Sit to Stand: Min assist Stand pivot transfers: Min assist       General  transfer comment: Pt with poor walker safety.  She tends to push walker very far in front of her, and leans anteriorly    Balance Overall balance assessment: Needs assistance Sitting-balance support: Feet supported Sitting balance-Leahy Scale: Fair     Standing balance support: Single extremity supported Standing balance-Leahy Scale: Poor Standing balance comment: requires UE support                            ADL either performed or assessed with clinical judgement   ADL Overall ADL's : Needs assistance/impaired Eating/Feeding: Set up;Bed level   Grooming: Wash/dry hands;Min guard;Standing   Upper Body Bathing: Minimal assistance;Sitting   Lower Body Bathing: Moderate assistance;Sit to/from stand   Upper Body Dressing : Minimal assistance;Sitting   Lower Body Dressing: Moderate assistance;Sit to/from stand Lower Body Dressing Details (indicate cue type and reason): frequently drops socks  Toilet Transfer: Minimal assistance;RW Toilet Transfer Details (indicate cue type and reason): mod verbal cues for walker safety  Toileting- Clothing Manipulation and Hygiene: Minimal assistance;Sit to/from stand       Functional mobility during ADLs: Minimal assistance;Rolling walker       Vision Baseline Vision/History: Wears glasses Wears Glasses: Reading only Patient Visual Report: No change from baseline Vision Assessment?: Yes Additional Comments: when attempting to assess vision, pt demonstrated Rt gaze preference.  She demonstratd full EOMs with cues, but did not spontaneously look to Lt.  and unable to look to midline for correct testing.   During confrontation testing, she was  unable to count finger, or identify object correctly in her visual field and only indicated seeing objects on Rt, and reaching forward to touch therapist's Rt hand.   She was unable to see or locate knife held in her Lt peripheral field "I don't see a knife"      Perception  Perception Perception Tested?: Yes Perception Deficits: Inattention/neglect Inattention/Neglect: Does not attend to left visual field;Does not attend to left side of body Comments: Pt with period of Rt gaze preference, not attending to Lt visual field and repeatedly lifting Rt UE and Rt LE when asked to move Lt extremeties    Praxis Praxis Praxis tested?: Within functional limits    Pertinent Vitals/Pain Pain Assessment: No/denies pain     Hand Dominance Right   Extremity/Trunk Assessment Upper Extremity Assessment Upper Extremity Assessment: LUE deficits/detail LUE Deficits / Details: Pt frequently dropping items in Lt hand.   Movement in [place] beginning stage V.  She reports impaired sensation in hand, but attributes this to Carpal tunnel  LUE Coordination: decreased gross motor;decreased fine motor   Lower Extremity Assessment Lower Extremity Assessment: Defer to PT evaluation   Cervical / Trunk Assessment Cervical / Trunk Assessment: Kyphotic   Communication Communication Communication: No difficulties   Cognition Arousal/Alertness: Awake/alert;Lethargic Behavior During Therapy: WFL for tasks assessed/performed Overall Cognitive Status: Impaired/Different from baseline Area of Impairment: Attention;Following commands;Problem solving;Awareness                   Current Attention Level: Sustained   Following Commands: Follows one step commands consistently;Follows one step commands inconsistently   Awareness: Intellectual Problem Solving: Slow processing;Difficulty sequencing;Requires verbal cues;Requires tactile cues General Comments: Pt asked for assist opening towlette.  It was handed back to her unfolded, and she proceeded to attempt to further unfold it.   When it was explained that it was fully unfolded, she continued attempts to unfoled it.   She was heavily distracted by food droppings on the bed, reaching fully across body and forward (while standing)  in attempts to cleaning them up.  She is slow to respond, and required mod cues for RW safety.  The Short Blessed Test was administered with pt scoring 2/28- 1 error with sequencing    General Comments  RN alerted to Rt gaze preference and pt neglecting the Lt - MD made aware     Exercises     Shoulder Instructions      Home Living Family/patient expects to be discharged to:: Private residence Living Arrangements: Spouse/significant other Available Help at Discharge: Family Type of Home: House Home Access: [place]: One level     Bathroom Shower/Tub: Teacher, early years/pre: [place]: Environmental consultant - 2 wheels;Tub bench;Cane - single point   Additional Comments: Pt indicates spouse can assist her at discharge       Prior Functioning/Environment Level of Independence: Needs assistance  Gait / Transfers Assistance Needed: Pt reports she was ambulating with RW  ADL's / Homemaking Assistance Needed: Pt reports she was performing ADLs mod I, and that she was driving and doing light house cleaning, including mopping floors.  No family present to clarify info             OT Problem List: Decreased strength;Decreased activity tolerance;Impaired balance (sitting and/or standing);Impaired vision/perception;Decreased coordination;Decreased cognition;Decreased safety awareness;Decreased knowledge of use of DME or AE;Impaired UE functional use;Impaired sensation      OT Treatment/Interventions: Self-care/ADL  training;Neuromuscular education;Manual therapy;Therapeutic activities;Cognitive remediation/compensation;Visual/perceptual remediation/compensation;Patient/family education;Balance training    OT Goals(Current goals can be found in the care plan section) Acute Rehab OT Goals Patient Stated Goal: she did not state  OT Goal Formulation: With patient Time For Goal Achievement: 07/11/19 Potential to Achieve Goals: Good ADL Goals Pt  Will Perform Grooming: standing;with supervision Pt Will Perform Upper Body Bathing: with set-up;sitting Pt Will Perform Lower Body Bathing: with supervision;sit to/from stand Pt Will Perform Upper Body Dressing: with set-up;sitting Pt Will Perform Lower Body Dressing: with min guard assist;sit to/from stand Pt Will Transfer to Toilet: with supervision;ambulating;regular height toilet;grab bars Pt Will Perform Toileting - Clothing Manipulation and hygiene: with supervision;sit to/from stand  OT Frequency: Min 2X/week   Barriers to D/C:            Co-evaluation              AM-PAC OT "6 Clicks" Daily Activity     Outcome Measure Help from another person eating meals?: A Little Help from another person taking care of personal grooming?: A Little Help from another person toileting, which includes using toliet, bedpan, or urinal?: A Little Help from another person bathing (including washing, rinsing, drying)?: A Little Help from another person to put on and taking off regular upper body clothing?: A Lot Help from another person to put on and taking off regular lower body clothing?: A Lot 6 Click Score: 16   End of Session Equipment Utilized During Treatment: Surveyor, mining Communication: Mobility status  Activity Tolerance: Patient limited by fatigue Patient left: in bed;with call bell/phone within reach;with bed alarm set;with nursing/sitter in room  OT Visit Diagnosis: Unsteadiness on feet (R26.81);Cognitive communication deficit (R41.841) Symptoms and signs involving cognitive functions: Cerebral infarction                Time: 9021-1155 OT Time Calculation (min): 38 min Charges:  OT General Charges $OT Visit: 1 Visit OT Evaluation $OT Eval Moderate Complexity: 1 Mod OT Treatments $Self Care/Home Management : 23-37 mins  Lucille Passy, OTR/L Acute Rehabilitation Services Pager [phone] Office [phone]   Lucille Passy M 06/27/2019, 4:16 PM

## 2019-06-27 NOTE — Progress Notes (Signed)
  Echocardiogram 2D Echocardiogram has been performed.  [name] 06/27/2019, 9:54 AM

## 2019-06-27 NOTE — Progress Notes (Signed)
ANTICOAGULATION CONSULT NOTE - Initial Consult  Pharmacy Consult for Apixaban Indication: atrial fibrillation  Allergies  Allergen Reactions  . Amprenavir Other (See Comments)    Unknown reaction  . Bactrim [Sulfamethoxazole-Trimethoprim] Nausea Only  . Ceftriaxone Sodium Other (See Comments)    Tingly feeling after given, then one hour later while vanco infusing pruritic rash  . Phenergan [Promethazine Hcl] Other (See Comments)    Hallucinations  . Sulfonamide Derivatives Hives  . Vancomycin Hcl Rash    Rash happened 1hr after rocephin but immediately after infusing vancomycin    Patient Measurements: Height: 5\' 3"  (160 cm) Weight: 110 lb 3.7 oz (50 kg) IBW/kg (Calculated) : 52.4  Vital Signs: Temp: 97.4 F (36.3 C) (08/09 1215) Temp Source: Oral (08/09 1215) BP: 175/102 (08/09 1215) Pulse Rate: 87 (08/09 1215)  Labs: Recent Labs    06/26/19 1958 06/27/19 0411  HGB 17.6*  --   HCT 53.2*  --   PLT 197  --   APTT 41*  --   LABPROT 22.4* 21.3*  INR 2.0* 1.9*  CREATININE 0.77  --     Estimated Creatinine Clearance: 38.4 mL/min (by C-G formula based on SCr of 0.77 mg/dL).   Medical History: Past Medical History:  Diagnosis Date  . Acute osteomyelitis ([place]) 08/25/2018   Right toe  . Anemia   . Atrial fibrillation ([place])   . Carpal tunnel syndrome, bilateral   . Cellulitis 08/25/2018   Right foot  . Cervical lymphadenopathy   . Compression fracture of fifth lumbar vertebra ([facility])    to T 11  . DDD (degenerative disc disease), lumbar   . DJD (degenerative joint disease)   . DOE (dyspnea on exertion)   . Dysphagia   . Dyspnea    with exertion  . Dysrhythmia    hx brief AF post op hip surg  . Esophageal dysmotility 01/31/2014   noted barium  . GERD (gastroesophageal reflux disease)   . Hematoma    right side after stent placement  . History of hiatal hernia 01/31/2014   Small, noted on Barium  . Hyperlipidemia   . Hypertension   . Jaundice   . Low  back pain   . MRSA (methicillin resistant Staphylococcus aureus)   . Neuropathy   . Nocturia   . OA (osteoarthritis)   . Osteoporosis   . Ovarian cyst   . PAD (peripheral artery disease) ([place])   . Peripheral vascular disease ([place])   . Pleural effusion, left 04/17/2017   Small  . RA (rheumatoid arthritis) ([place])   . Spinal stenosis   . Toe ulcer, right, with necrosis of bone ([place]) 08/25/2018  . Type 2 diabetes mellitus ([place])   . Unsteady gait   . Urinary incontinence   . Uterine polyp   . Varicose vein of leg    Bilateral    Medications:  Medications Prior to Admission  Medication Sig Dispense Refill Last Dose  . acetaminophen (TYLENOL) 325 MG tablet Take 650 mg by mouth daily with breakfast.    06/26/2019 at am  . aspirin EC 325 MG tablet Take 325 mg by mouth daily with supper.    06/25/2019 at pm  . calcium carbonate (OSCAL) 1500 (600 Ca) MG TABS tablet Take 600 mg of elemental calcium by mouth daily with breakfast.    06/26/2019 at am  . ergocalciferol (VITAMIN D2) 1.25 MG (50000 UT) capsule Take 50,000 Units by mouth every Sunday.    06/20/2019  . ferrous sulfate 325 (65 FE)  MG tablet Take 325 mg by mouth daily with breakfast.   06/26/2019 at am  . metFORMIN (GLUCOPHAGE-XR) 500 MG 24 hr tablet Take 500 mg by mouth daily with supper.   06/25/2019 at pm  . metoprolol succinate (TOPROL-XL) 25 MG 24 hr tablet Take 25 mg by mouth daily with supper.    06/25/2019 at 1830  . vitamin B-12 (CYANOCOBALAMIN) 1000 MCG tablet Take 1,000 mcg by mouth daily with breakfast.    06/26/2019 at am  . warfarin (COUMADIN) 2 MG tablet Take 2 mg by mouth daily with supper. Take 0.5 tablet (1 mg) by mouth on Thursday evenings & take 1 tablet (2 mg) by mouth on all other nights.    06/25/2019 at 1830   Scheduled:  . aspirin  300 mg Rectal Daily   Or  . aspirin  325 mg Oral Daily  . atorvastatin  40 mg Oral q1800  . insulin aspart  0-9 Units Subcutaneous TID WC  . [START ON 07/01/2019] warfarin  1 mg Oral Q Thu-1800   . warfarin  2 mg Oral Once per day on Sun Mon Tue Wed Fri Sat  . Warfarin - Pharmacist Dosing Inpatient   Does not apply q1800    Assessment: 85yo female admitted for TIA vs migraine, to switch from coumadin to apixaban per neurology. Patient qualifies for the reduced dose (age, weight).  Plan:  Discontinue Warfarin Start apixaban 2.5mg  BID  Nicoletta Dress, PharmD PGY2 Infectious Disease Pharmacy Resident

## 2019-06-27 NOTE — Progress Notes (Addendum)
STROKE TEAM PROGRESS NOTE   HISTORY OF PRESENT ILLNESS (per record) [name] is a 85 y.o. female with past medical history of atrial fibrillation on Coumadin, diabetes mellitus, rheumatoid arthritis, peripheral artery disease presents to the ED after family witnessed episode of slurred speech and left-sided weakness.  Patient is a poor historian but states that on Friday she was at the salon and they noticed patient she was acting confused.  Her symptoms resolved completely and yesterday around 5:15 PM patient had an episode witnessed by family pressured speech was slurred.  This resolved but again reoccurred at 5:45 PM.  Patient also noticed that her left arm felt numb and weak which is subsequently improved.  Patient was brought by family to the emergency room for further evaluation.  Patient symptoms have completely resolved at this time.  Patient was admitted to medicine for further evaluation of her stroke symptoms.  Neurology was consulted  Date last known well:.  06/26/2019 Time last known well: 5:15 PM tPA Given: No, symptoms resolving in outside TPA window NIHSS: 0 Baseline MRS 0   INTERVAL HISTORY Her son is at the bedside. Reviewed the images and answered questions. Discussed her INR is 1.9, she is a poor historian but son helps her with meds. Recommended Eliquis change.Neither are aware of any other reason she is on Warfarin other than Afib, they tried pradaxa in the past but she did not tolerate.Never tried Eliquis.  Addendum:  I signed off on patient but this evening nursing reported that her eyes deviated to the right but she was coherent and answering questions.  When I saw patient her exam was stable and I did not see anything unusual.  I advised them to monitor her and if it happens again to call neuro, may want to check with attending on Monday and just ensure were not needed again.   OBJECTIVE Vitals:   06/27/19 0355 06/27/19 0500 06/27/19 0700 06/27/19 0749  BP:  (!) 196/96 (!) 192/88 (!) 165/85 (!) 161/89  Pulse:  79  81  Resp: 15 16 16 16   Temp: 97.7 F (36.5 C)   97.8 F (36.6 C)  TempSrc: Oral   Oral  SpO2: 97% 97% 99% 99%  Weight:      Height:        CBC:  Recent Labs  Lab 06/26/19 1958  WBC 8.1  NEUTROABS 5.8  HGB 17.6*  HCT 53.2*  MCV 97.1  PLT 017    Basic Metabolic Panel:  Recent Labs  Lab 06/26/19 1958  NA 140  K 4.5  CL 104  CO2 26  GLUCOSE 148*  BUN 21  CREATININE 0.77  CALCIUM 9.9    Lipid Panel:     Component Value Date/Time   CHOL 187 06/27/2019 0411   TRIG 170 (H) 06/27/2019 0411   HDL 27 (L) 06/27/2019 0411   CHOLHDL 6.9 06/27/2019 0411   VLDL 34 06/27/2019 0411   LDLCALC 126 (H) 06/27/2019 0411   HgbA1c:  Lab Results  Component Value Date   HGBA1C 7.4 (H) 06/27/2019   Urine Drug Screen: No results found for: LABOPIA, COCAINSCRNUR, LABBENZ, AMPHETMU, THCU, LABBARB  Alcohol Level No results found for: ETH  IMAGING  Ct Head Wo Contrast 06/26/2019 IMPRESSION:  1. No acute intracranial abnormalities are identified.    Mr Angio Head Wo Contrast 06/27/2019 IMPRESSION:  1. 2-3 small foci of acute to early subacute ischemia within the posterior right temporal lobe, within the PCA territory.  2. Short  segment occlusions of both posterior cerebral arteries, the P2-3 junction on the right and proximal P2 segment on the left.  3. Short segment occlusion of the right MCA M1 segment, just proximal to the bifurcation, with normal M2 branches.  4. Moderate stenosis of the left M2 inferior division.    Mr Brain Wo Contrast 06/27/2019 IMPRESSION:  1. 2-3 small foci of acute to early subacute ischemia within the posterior right temporal lobe, within the PCA territory.  2. 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  3. Short segment occlusion of the right MCA M1 segment, just proximal to the bifurcation, with normal M2  branches.  4. Moderate stenosis of the left M2 inferior division.    Transthoracic Echocardiogram  06/23/2019 IMPRESSIONS  1. The left ventricle has normal systolic function, with an ejection fraction of 55-60%. The cavity size was normal. There is moderately increased left ventricular wall thickness. Left ventricular diastolic function could not be evaluated secondary to atrial fibrillation.  2. The right ventricle has normal systolic function. The cavity was normal. There is no increase in right ventricular wall thickness. Right ventricular systolic pressure is normal with an estimated pressure of 21.0 mmHg.  3. Left atrial size was moderately dilated.  4. Right atrial size was mildly dilated.  5. The aortic valve is tricuspid. Moderate thickening of the aortic valve. Moderate calcification of the aortic valve. Mild stenosis of the aortic valve. Severe aortic annular calcification noted.  6. The mitral valve is degenerative. Mild thickening of the mitral valve leaflet. Mild calcification of the mitral valve leaflet. There is mild mitral annular calcification present.  7. The aorta is normal in size and structure.   Bilateral Carotid Dopplers  00/00/2020 1-39% bilat    ECG - Atrial fibrillation rate 73 BPM. (See cardiology reading for complete details)   PHYSICAL EXAM Blood pressure (!) 161/89, pulse 81, temperature 97.8 F (36.6 C), temperature source Oral, resp. rate 16, height 5\' 3"  (1.6 m), weight 50 kg, SpO2 99 %.  Irregular rate, normocephalic, atraumatic, abdomen soft Exam: NAD, pleasant                  Speech:    Speech is normal; fluent and spontaneous with normal comprehension.  Cognition:    The patient is oriented to person, place, and time;     recent and remote memory impaired;     language fluent;    Cranial Nerves:    The pupils are equal, round, and reactive to light.Trigeminal sensation is intact and the muscles of mastication are normal. The face is  symmetric. The palate elevates in the midline. Hearing intact. Voice is normal. Shoulder shrug is normal. The tongue has normal motion without fasciculations.   Coordination:  No dysmetria  Motor Observation:    No asymmetry, no atrophy, and no involuntary movements noted. Tone:    Normal muscle tone.     Strength:    Strength is equivalent in all extremities, no focal deficit noted.      Sensation: intact to LT          HOSPITAL MEDICATIONS:  . aspirin  300 mg Rectal Daily   Or  . aspirin  325 mg Oral Daily  . atorvastatin  40 mg Oral q1800  . insulin aspart  0-9 Units Subcutaneous TID WC  . [START ON 07/01/2019] warfarin  1 mg Oral Q Thu-1800  . warfarin  2 mg Oral Once per day on Sun Mon Tue Wed Fri Sat  . Warfarin - Pharmacist Dosing Inpatient   Does not apply q1800     ASSESSMENT/PLAN Ms. [name] is a 85 y.o. female with history of atrial fibrillation on Coumadin (INR - 2.0), diabetes mellitus, rheumatoid arthritis, HLD, Htn, RA, peripheral artery disease presenting with transient speech difficulties, left arm weakness / numbness and confusion. She did not receive IV t-PA due to resolution of deficits.  Stroke:  posterior right temporal lobe, within the PCA territory infarct(may be more MCA territory) - Likely embolic secondary to atrial fibrillation with subtherapeutic INR.   Code Stroke CT Head - not ordered  CT head - No acute intracranial abnormalities are identified.   MRI head -  2-3 small foci of acute to early subacute ischemia within the posterior right temporal lobe, within the PCA territory.   MRA head - Short segment occlusions of both posterior cerebral arteries, the P2-3 junction on the right and proximal P2 segment on the left. Short segment occlusion of the right MCA M1 segment, just proximal to the bifurcation, with normal M2 branches. Moderate stenosis of the left M2 inferior division.   CTA H&N   CT Perfusion - not ordered  Carotid  Doppler - 1-39% bilat   2D Echo - EF 55-60 %. No cardiac source of emboli identified. Severe aortic annular calcification noted.  Sars Corona Virus 2 - negative  LDL - 126  HgbA1c - 7.4  UDS - not performed  VTE prophylaxis - warfarin Diet  Diet Order            Diet NPO time specified  Diet effective now               aspirin 325 mg daily and warfarin daily prior to admission, now on aspirin 325 mg daily and warfarin daily. Recommend starting Eliquis in place of earfarin and continuing aspirin, patient has been unable to go to the doctors to check INR and so eliquis may be better for stroke prevention and less bleeding risk.   Patient counseled to be compliant with her antithrombotic medications  Ongoing aggressive stroke risk factor management  Therapy recommendations:  pending  Disposition:  Pending  Hypertension  Home BP meds: Toprol XL 25 mg daily  Current BP meds: none  Blood pressure somewhat high at times but within post stroke/TIA parameters . Permissive hypertension (OK if < 220/120) but gradually normalize in 5-7 days  . Long-term BP goal normotensive  Hyperlipidemia  Home Lipid lowering medication: none  LDL 126, goal < 70  Current lipid lowering medication: Lipitor 40 mg daily, may not be co,pliant may drop pills asked son to monitor medications and follow up with pcp for adjustment  Continue statin at discharge  Diabetes  Home diabetic meds: Metformin  Current diabetic meds: SSI  HgbA1c 7.4, goal < 7.0  No results for input(s): GLUCAP in the last 72 hours.  Other Stroke Risk Factors  Advanced age  Previous ETOH use  Atrial fibrillation - coumadin PTA  Other Active Problems  Memory impairment, advised son to continue to assist with medication and monitor  PLAN per Dr Jaynee Eagles  Change warfarin to Eliquis - will notify pharmacy  Stroke will sign off.     Hospital day # 0  Personally examined patient and images, and have  participated in and made any corrections needed to history, physical, neuro exam,assessment and plan as stated above.  I have personally  obtained the history, evaluated lab date, reviewed imaging studies and agree with radiology interpretations.    Sarina Ill, MD Stroke Neurology   A total of 35 minutes was spent for the care of this patient, spent on counseling patient and family on different diagnostic and therapeutic options, counseling and coordination of care, riskd ans benefits of management, compliance, or risk factor reduction and education.   To contact Stroke Continuity provider, please refer to [email or web address] After hours, contact General Neurology

## 2019-06-28 ENCOUNTER — Encounter (HOSPITAL_COMMUNITY): Payer: Self-pay

## 2019-06-28 ENCOUNTER — Inpatient Hospital Stay (HOSPITAL_COMMUNITY)
Admission: RE | Admit: 2019-06-28 | Discharge: 2019-07-13 | DRG: 057 | Disposition: A | Discharge status: Home Health | Payer: Medicare Other | Source: Intra-hospital | Attending: Physical Medicine & Rehabilitation | Admitting: Physical Medicine & Rehabilitation

## 2019-06-28 ENCOUNTER — Other Ambulatory Visit: Payer: Self-pay

## 2019-06-28 DIAGNOSIS — Z882 Allergy status to sulfonamides status: Secondary | ICD-10-CM | POA: Diagnosis not present

## 2019-06-28 DIAGNOSIS — Z9849 Cataract extraction status, unspecified eye: Secondary | ICD-10-CM

## 2019-06-28 DIAGNOSIS — I10 Essential (primary) hypertension: Secondary | ICD-10-CM

## 2019-06-28 DIAGNOSIS — Z89421 Acquired absence of other right toe(s): Secondary | ICD-10-CM | POA: Diagnosis not present

## 2019-06-28 DIAGNOSIS — I69322 Dysarthria following cerebral infarction: Secondary | ICD-10-CM | POA: Diagnosis not present

## 2019-06-28 DIAGNOSIS — I69354 Hemiplegia and hemiparesis following cerebral infarction affecting left non-dominant side: Principal | ICD-10-CM

## 2019-06-28 DIAGNOSIS — E785 Hyperlipidemia, unspecified: Secondary | ICD-10-CM | POA: Diagnosis not present

## 2019-06-28 DIAGNOSIS — I480 Paroxysmal atrial fibrillation: Secondary | ICD-10-CM | POA: Diagnosis present

## 2019-06-28 DIAGNOSIS — Z7901 Long term (current) use of anticoagulants: Secondary | ICD-10-CM

## 2019-06-28 DIAGNOSIS — I6389 Other cerebral infarction: Secondary | ICD-10-CM | POA: Diagnosis present

## 2019-06-28 DIAGNOSIS — Z881 Allergy status to other antibiotic agents status: Secondary | ICD-10-CM

## 2019-06-28 DIAGNOSIS — R351 Nocturia: Secondary | ICD-10-CM | POA: Diagnosis present

## 2019-06-28 DIAGNOSIS — R4182 Altered mental status, unspecified: Secondary | ICD-10-CM | POA: Diagnosis not present

## 2019-06-28 DIAGNOSIS — M069 Rheumatoid arthritis, unspecified: Secondary | ICD-10-CM | POA: Diagnosis present

## 2019-06-28 DIAGNOSIS — E1165 Type 2 diabetes mellitus with hyperglycemia: Secondary | ICD-10-CM | POA: Diagnosis not present

## 2019-06-28 DIAGNOSIS — I69311 Memory deficit following cerebral infarction: Secondary | ICD-10-CM | POA: Diagnosis not present

## 2019-06-28 DIAGNOSIS — E119 Type 2 diabetes mellitus without complications: Secondary | ICD-10-CM

## 2019-06-28 DIAGNOSIS — R569 Unspecified convulsions: Secondary | ICD-10-CM | POA: Diagnosis not present

## 2019-06-28 DIAGNOSIS — Z7982 Long term (current) use of aspirin: Secondary | ICD-10-CM

## 2019-06-28 DIAGNOSIS — R791 Abnormal coagulation profile: Secondary | ICD-10-CM | POA: Diagnosis present

## 2019-06-28 DIAGNOSIS — Z96642 Presence of left artificial hip joint: Secondary | ICD-10-CM | POA: Diagnosis present

## 2019-06-28 DIAGNOSIS — Z96653 Presence of artificial knee joint, bilateral: Secondary | ICD-10-CM | POA: Diagnosis present

## 2019-06-28 DIAGNOSIS — R7309 Other abnormal glucose: Secondary | ICD-10-CM

## 2019-06-28 DIAGNOSIS — Z7984 Long term (current) use of oral hypoglycemic drugs: Secondary | ICD-10-CM

## 2019-06-28 DIAGNOSIS — R413 Other amnesia: Secondary | ICD-10-CM | POA: Diagnosis not present

## 2019-06-28 DIAGNOSIS — M545 Low back pain: Secondary | ICD-10-CM | POA: Diagnosis not present

## 2019-06-28 DIAGNOSIS — Z888 Allergy status to other drugs, medicaments and biological substances status: Secondary | ICD-10-CM

## 2019-06-28 DIAGNOSIS — E1151 Type 2 diabetes mellitus with diabetic peripheral angiopathy without gangrene: Secondary | ICD-10-CM | POA: Diagnosis not present

## 2019-06-28 DIAGNOSIS — G5603 Carpal tunnel syndrome, bilateral upper limbs: Secondary | ICD-10-CM | POA: Diagnosis present

## 2019-06-28 DIAGNOSIS — Z8614 Personal history of Methicillin resistant Staphylococcus aureus infection: Secondary | ICD-10-CM | POA: Diagnosis not present

## 2019-06-28 DIAGNOSIS — K219 Gastro-esophageal reflux disease without esophagitis: Secondary | ICD-10-CM | POA: Diagnosis present

## 2019-06-28 DIAGNOSIS — Z95820 Peripheral vascular angioplasty status with implants and grafts: Secondary | ICD-10-CM

## 2019-06-28 DIAGNOSIS — I6601 Occlusion and stenosis of right middle cerebral artery: Secondary | ICD-10-CM | POA: Diagnosis present

## 2019-06-28 DIAGNOSIS — I63431 Cerebral infarction due to embolism of right posterior cerebral artery: Secondary | ICD-10-CM | POA: Diagnosis not present

## 2019-06-28 DIAGNOSIS — D72829 Elevated white blood cell count, unspecified: Secondary | ICD-10-CM | POA: Diagnosis not present

## 2019-06-28 DIAGNOSIS — I4891 Unspecified atrial fibrillation: Secondary | ICD-10-CM

## 2019-06-28 DIAGNOSIS — Z79899 Other long term (current) drug therapy: Secondary | ICD-10-CM

## 2019-06-28 DIAGNOSIS — M810 Age-related osteoporosis without current pathological fracture: Secondary | ICD-10-CM | POA: Diagnosis present

## 2019-06-28 DIAGNOSIS — I6623 Occlusion and stenosis of bilateral posterior cerebral arteries: Secondary | ICD-10-CM | POA: Diagnosis present

## 2019-06-28 LAB — BASIC METABOLIC PANEL
Anion gap: 18 — ABNORMAL HIGH (ref 5–15)
BUN: 31 mg/dL — ABNORMAL HIGH (ref 8–23)
CO2: 17 mmol/L — ABNORMAL LOW (ref 22–32)
Calcium: 9.1 mg/dL (ref 8.9–10.3)
Chloride: 105 mmol/L (ref 98–111)
Creatinine, Ser: 1.13 mg/dL — ABNORMAL HIGH (ref 0.44–1.00)
GFR calc Af Amer: 50 mL/min — ABNORMAL LOW (ref >60)
GFR calc non Af Amer: 43 mL/min — ABNORMAL LOW (ref >60)
Glucose, Bld: 211 mg/dL — ABNORMAL HIGH (ref 70–99)
Potassium: 4 mmol/L (ref 3.5–5.1)
Sodium: 140 mmol/L (ref 135–145)

## 2019-06-28 LAB — CBC
HCT: 53.3 % — ABNORMAL HIGH (ref 36.0–46.0)
Hemoglobin: 18.3 g/dL — ABNORMAL HIGH (ref 12.0–15.0)
MCH: 32.5 pg (ref 26.0–34.0)
MCHC: 34.3 g/dL (ref 30.0–36.0)
MCV: 94.7 fL (ref 80.0–100.0)
Platelets: 225 10*3/uL (ref 150–400)
RBC: 5.63 MIL/uL — ABNORMAL HIGH (ref 3.87–5.11)
RDW: 12.5 % (ref 11.5–15.5)
WBC: 14.1 10*3/uL — ABNORMAL HIGH (ref 4.0–10.5)
nRBC: 0 % (ref 0.0–0.2)

## 2019-06-28 LAB — GLUCOSE, CAPILLARY
Glucose-Capillary: 213 mg/dL — ABNORMAL HIGH (ref 70–99)
Glucose-Capillary: 214 mg/dL — ABNORMAL HIGH (ref 70–99)
Glucose-Capillary: 138 mg/dL — ABNORMAL HIGH (ref 70–99)
Glucose-Capillary: 148 mg/dL — ABNORMAL HIGH (ref 70–99)

## 2019-06-28 MED ORDER — ATORVASTATIN CALCIUM 40 MG PO TABS
40.0000 mg | ORAL_TABLET | Freq: Every day | ORAL | Status: DC
  Administered 2019-06-28 – 2019-07-12 (×14): 40 mg via ORAL
  Filled 2019-06-28 (×14): qty 1

## 2019-06-28 MED ORDER — SODIUM CHLORIDE 0.9 % IV SOLN: INTRAVENOUS | Status: DC

## 2019-06-28 MED ORDER — SENNOSIDES-DOCUSATE SODIUM 8.6-50 MG PO TABS: 1.0000 | ORAL_TABLET | Freq: Every evening | ORAL | Status: DC | PRN

## 2019-06-28 MED ORDER — HALOPERIDOL LACTATE 5 MG/ML IJ SOLN
2.0000 mg | Freq: Once | INTRAMUSCULAR | Status: AC
  Administered 2019-06-28: 2 mg via INTRAVENOUS
  Filled 2019-06-28: qty 1

## 2019-06-28 MED ORDER — ATORVASTATIN CALCIUM 40 MG PO TABS: 40.0000 mg | ORAL_TABLET | Freq: Every day | ORAL | Status: DC

## 2019-06-28 MED ORDER — HYDRALAZINE HCL 20 MG/ML IJ SOLN: 5.0000 mg | Freq: Four times a day (QID) | INTRAMUSCULAR | Status: DC | PRN

## 2019-06-28 MED ORDER — AMLODIPINE BESYLATE 5 MG PO TABS
5.0000 mg | ORAL_TABLET | Freq: Every day | ORAL | Status: DC
  Administered 2019-06-29 – 2019-07-04 (×6): 5 mg via ORAL
  Filled 2019-06-28 (×6): qty 1

## 2019-06-28 MED ORDER — ACETAMINOPHEN 160 MG/5ML PO SOLN: 650.0000 mg | ORAL | Status: DC | PRN

## 2019-06-28 MED ORDER — ACETAMINOPHEN 650 MG RE SUPP: 650.0000 mg | RECTAL | Status: DC | PRN

## 2019-06-28 MED ORDER — AMLODIPINE BESYLATE 5 MG PO TABS
5.0000 mg | ORAL_TABLET | Freq: Every day | ORAL | Status: DC
  Administered 2019-06-28: 5 mg via ORAL

## 2019-06-28 MED ORDER — AMLODIPINE BESYLATE 5 MG PO TABS: 5.0000 mg | ORAL_TABLET | Freq: Every day | ORAL | Status: DC

## 2019-06-28 MED ORDER — SORBITOL 70 % SOLN: 30.0000 mL | Freq: Every day | Status: DC | PRN

## 2019-06-28 MED ORDER — METOPROLOL SUCCINATE ER 25 MG PO TB24
12.5000 mg | ORAL_TABLET | Freq: Every day | ORAL | Status: DC
  Administered 2019-06-29: 12.5 mg via ORAL
  Filled 2019-06-28: qty 1

## 2019-06-28 MED ORDER — APIXABAN 2.5 MG PO TABS
2.5000 mg | ORAL_TABLET | Freq: Two times a day (BID) | ORAL | Status: DC
  Administered 2019-06-28 – 2019-07-13 (×30): 2.5 mg via ORAL
  Filled 2019-06-28 (×30): qty 1

## 2019-06-28 MED ORDER — ACETAMINOPHEN 325 MG PO TABS
650.0000 mg | ORAL_TABLET | ORAL | Status: DC | PRN
  Administered 2019-06-30 – 2019-07-12 (×9): 650 mg via ORAL
  Filled 2019-06-28 (×12): qty 2

## 2019-06-28 MED ORDER — ASPIRIN 325 MG PO TABS
325.0000 mg | ORAL_TABLET | Freq: Every day | ORAL | Status: DC
  Administered 2019-06-29: 325 mg via ORAL
  Filled 2019-06-28: qty 1

## 2019-06-28 MED ORDER — ASPIRIN 300 MG RE SUPP: 300.0000 mg | Freq: Every day | RECTAL | Status: DC

## 2019-06-28 MED ORDER — APIXABAN 2.5 MG PO TABS: 2.5000 mg | ORAL_TABLET | Freq: Two times a day (BID) | ORAL | Status: DC

## 2019-06-28 MED ORDER — SODIUM CHLORIDE 0.9 % IV SOLN
INTRAVENOUS | Status: AC
  Administered 2019-06-28: 16:00:00 via INTRAVENOUS

## 2019-06-28 MED ORDER — INSULIN ASPART 100 UNIT/ML ~~LOC~~ SOLN
0.0000 [IU] | Freq: Three times a day (TID) | SUBCUTANEOUS | Status: DC
  Administered 2019-06-28 – 2019-06-29 (×2): 1 [IU] via SUBCUTANEOUS
  Administered 2019-06-29: 2 [IU] via SUBCUTANEOUS
  Administered 2019-06-30: 1 [IU] via SUBCUTANEOUS
  Administered 2019-06-30: 5 [IU] via SUBCUTANEOUS
  Administered 2019-06-30 – 2019-07-01 (×2): 3 [IU] via SUBCUTANEOUS
  Administered 2019-07-01 – 2019-07-02 (×4): 2 [IU] via SUBCUTANEOUS
  Administered 2019-07-02: 5 [IU] via SUBCUTANEOUS
  Administered 2019-07-03 (×2): 2 [IU] via SUBCUTANEOUS
  Administered 2019-07-03: 1 [IU] via SUBCUTANEOUS
  Administered 2019-07-04: 3 [IU] via SUBCUTANEOUS
  Administered 2019-07-04 – 2019-07-05 (×3): 2 [IU] via SUBCUTANEOUS
  Administered 2019-07-05: 3 [IU] via SUBCUTANEOUS
  Administered 2019-07-06: 2 [IU] via SUBCUTANEOUS
  Administered 2019-07-06: 3 [IU] via SUBCUTANEOUS
  Administered 2019-07-06: 2 [IU] via SUBCUTANEOUS
  Administered 2019-07-07: 1 [IU] via SUBCUTANEOUS
  Administered 2019-07-07: 2 [IU] via SUBCUTANEOUS
  Administered 2019-07-07: 3 [IU] via SUBCUTANEOUS
  Administered 2019-07-08 (×2): 2 [IU] via SUBCUTANEOUS
  Administered 2019-07-08: 5 [IU] via SUBCUTANEOUS
  Administered 2019-07-09: 3 [IU] via SUBCUTANEOUS
  Administered 2019-07-09: 2 [IU] via SUBCUTANEOUS
  Administered 2019-07-09: 3 [IU] via SUBCUTANEOUS
  Administered 2019-07-10: 2 [IU] via SUBCUTANEOUS
  Administered 2019-07-10: 3 [IU] via SUBCUTANEOUS
  Administered 2019-07-10 – 2019-07-11 (×2): 2 [IU] via SUBCUTANEOUS
  Administered 2019-07-11: 1 [IU] via SUBCUTANEOUS
  Administered 2019-07-11: 2 [IU] via SUBCUTANEOUS
  Administered 2019-07-12: 13:00:00 1 [IU] via SUBCUTANEOUS
  Administered 2019-07-12: 2 [IU] via SUBCUTANEOUS
  Administered 2019-07-12 – 2019-07-13 (×2): 1 [IU] via SUBCUTANEOUS

## 2019-06-28 NOTE — Progress Notes (Signed)
Patient increasingly confused, agitated, and yelling,  HR 120-150s afib  RN notified provider - new orders received  RN will continue to monitor patient

## 2019-06-28 NOTE — Progress Notes (Signed)
Patient increasingly confused, continues to try to get out of bed, becoming more agitated, verbally and physically aggressive.    Notified provider on call - new orders received   RN will continue to monitor patient.

## 2019-06-28 NOTE — Progress Notes (Addendum)
Inpatient Rehab Admissions:  Inpatient Rehab Consult received.  I met with patient and her son at the bedside for rehabilitation assessment and to discuss goals and expectations of an inpatient rehab admission.  Pt is somewhat confused, but pleasant. They are open to CIR if needed.  Will continue to follow for PT session and plan for possible admission today pending recommendations and bed availability.   Addendum: I have approval from Dr. Loleta Books and a bed available for pt to admit to CIR today. I have let pt/family and CM know.   Signed: Shann Medal, PT, DPT Admissions Coordinator [phone] 06/28/19  11:12 AM

## 2019-06-28 NOTE — H&P (Signed)
Physical Medicine and Rehabilitation Admission H&P    Chief Complaint  Patient presents with   Transient Ischemic Attack  : HPI: [name] is an 85 year old right-handed female with history of PAF maintained on Coumadin, peripheral vascular disease status post left SFA angioplasty and stenting, type 2 diabetes mellitus, hypertension, hyperlipidemia.  Per chart review patient lives with spouse.  1 level home with ramped entrance.  Ambulates with a rolling walker.  She was performing her own ADLs modified independent.  Presented 06/26/2019 with left-sided weakness and slurred speech that has been transient over 2 days.  No reports of chest pain or shortness of breath.  Blood pressure in the ED 143/95-213/112.  INR on admission of 2.00, COVID negative.  Cranial CT scan negative for acute changes.  Patient did not receive TPA.  MRI of the brain showed small foci of acute early subacute ischemia within the posterior right temporal lobe within the PCA territory.  MR angiogram showed short segment occlusion of both posterior cerebral arteries, the P2-3 junction on the right and proximal P2 segment on the left.  Short segment occlusion of the right MCA M1 segment just proximal to the bifurcation with normal M2 branches.  Carotid Dopplers with no significant ICA stenosis.  Echocardiogram with ejection fraction of 37% normal systolic function.  Neurology consulted Coumadin transition to Eliquis as well as remaining on aspirin therapy.  Tolerating a regular consistency diet.  Appetite has been limited started on gentle IV fluids.  Therapy evaluations completed and patient was admitted for a comprehensive rehab program  Review of Systems  Constitutional: Negative for chills and fever.  HENT: Negative for hearing loss.   Eyes: Negative for blurred vision and double vision.  Respiratory: Negative for cough.        SOB with exertion  Cardiovascular: Positive for palpitations and leg swelling. Negative for  chest pain.  Gastrointestinal: Positive for constipation. Negative for heartburn, nausea and vomiting.       GERD  Genitourinary: Negative for dysuria, flank pain and hematuria.       Positive nocturia  Musculoskeletal: Positive for back pain, joint pain and myalgias.  Skin: Negative for rash.  Neurological: Positive for speech change and focal weakness.  All other systems reviewed and are negative.  Past Medical History:  Diagnosis Date   Acute osteomyelitis ([place]) 08/25/2018   Right toe   Anemia    Atrial fibrillation (HCC)    Carpal tunnel syndrome, bilateral    Cellulitis 08/25/2018   Right foot   Cervical lymphadenopathy    Compression fracture of fifth lumbar vertebra (HCC)    to T 11   DDD (degenerative disc disease), lumbar    DJD (degenerative joint disease)    DOE (dyspnea on exertion)    Dysphagia    Dyspnea    with exertion   Dysrhythmia    hx brief AF post op hip surg   Esophageal dysmotility 01/31/2014   noted barium   GERD (gastroesophageal reflux disease)    Hematoma    right side after stent placement   History of hiatal hernia 01/31/2014   Small, noted on Barium   Hyperlipidemia    Hypertension    Jaundice    Low back pain    MRSA (methicillin resistant Staphylococcus aureus)    Neuropathy    Nocturia    OA (osteoarthritis)    Osteoporosis    Ovarian cyst    PAD (peripheral artery disease) ([place])    Peripheral vascular  disease ([place])    Pleural effusion, left 04/17/2017   Small   RA (rheumatoid arthritis) ([place])    Spinal stenosis    Toe ulcer, right, with necrosis of bone ([place]) 08/25/2018   Type 2 diabetes mellitus (HCC)    Unsteady gait    Urinary incontinence    Uterine polyp    Varicose vein of leg    Bilateral   Past Surgical History:  Procedure Laterality Date   AMPUTATION Right 02/04/2013   Procedure: [name] ;  Surgeon: Wylene Simmer, MD;  Location: [facility];   Service: Orthopedics;  Laterality: Right;   AMPUTATION TOE Right 09/03/2018   Procedure: AMPUTATION 3RD RIGHT TOE INTERPHALANGEAL;  Surgeon: [name], DPM;  Location: [facility];  Service: Podiatry;  Laterality: Right;   APPENDECTOMY     COLONOSCOPY     EYE SURGERY     both cataracts   FEMORAL ARTERY EXPLORATION Right 04/12/2017   Procedure: EVACUATION HEMATOMA RIGHT GROIN WITH FEMORAL ARTERY REPAIR;  Surgeon: Rosetta Posner, MD;  Location: [facility];  Service: Vascular;  Laterality: Right;   INCISION AND DRAINAGE  2011   left hip inf-hemovac   IR ANGIOGRAM EXTREMITY LEFT  03/25/2017   IR ANGIOGRAM EXTREMITY LEFT  04/08/2017   IR ANGIOGRAM SELECTIVE EACH ADDITIONAL VESSEL  03/25/2017   IR FEM POP ART STENT INC PTA MOD SED  03/25/2017   IR INFUSION THROMBOL ARTERIAL INITIAL (MS)  04/08/2017   IR RADIOLOGIST EVAL & MGMT  02/26/2017   IR RADIOLOGIST EVAL & MGMT  04/03/2017   IR RADIOLOGIST EVAL & MGMT  08/20/2017   IR RADIOLOGIST EVAL & MGMT  01/22/2018   IR RADIOLOGIST EVAL & MGMT  01/13/2019   IR THROMB F/U EVAL ART/VEN FINAL DAY (MS)  04/09/2017   IR TIB-PERO ART PTA MOD SED  03/25/2017   IR US GUIDE VASC ACCESS RIGHT  03/25/2017   IR US GUIDE VASC ACCESS RIGHT  04/08/2017   KNEE ARTHROSCOPY  1995   left   NECK MASS EXCISION     TONSILLECTOMY     TOTAL HIP ARTHROPLASTY  2006   left-fx   TOTAL KNEE ARTHROPLASTY  2010,2012   left   TOTAL KNEE ARTHROPLASTY  2009   right   Family History  Problem Relation Age of Onset   Cancer Mother    Social History:  reports that she has never smoked. She has never used smokeless tobacco. She reports previous alcohol use. She reports that she does not use drugs. Allergies:  Allergies  Allergen Reactions   Amprenavir Other (See Comments)    Unknown reaction   Bactrim [Sulfamethoxazole-Trimethoprim] Nausea Only   Ceftriaxone Sodium Other (See Comments)    Tingly feeling after given, then one hour later while vanco infusing pruritic  rash   Phenergan [Promethazine Hcl] Other (See Comments)    Hallucinations   Sulfonamide Derivatives Hives   Vancomycin Hcl Rash    Rash happened 1hr after rocephin but immediately after infusing vancomycin   Medications Prior to Admission  Medication Sig Dispense Refill   acetaminophen (TYLENOL) 325 MG tablet Take 650 mg by mouth daily with breakfast.      aspirin EC 325 MG tablet Take 325 mg by mouth daily with supper.      calcium carbonate (OSCAL) 1500 (600 Ca) MG TABS tablet Take 600 mg of elemental calcium by mouth daily with breakfast.      ergocalciferol (VITAMIN D2) 1.25 MG (50000 UT) capsule  Take 50,000 Units by mouth every Sunday.      ferrous sulfate 325 (65 FE) MG tablet Take 325 mg by mouth daily with breakfast.     metFORMIN (GLUCOPHAGE-XR) 500 MG 24 hr tablet Take 500 mg by mouth daily with supper.     metoprolol succinate (TOPROL-XL) 25 MG 24 hr tablet Take 25 mg by mouth daily with supper.      vitamin B-12 (CYANOCOBALAMIN) 1000 MCG tablet Take 1,000 mcg by mouth daily with breakfast.      warfarin (COUMADIN) 2 MG tablet Take 2 mg by mouth daily with supper. Take 0.5 tablet (1 mg) by mouth on Thursday evenings & take 1 tablet (2 mg) by mouth on all other nights.       Drug Regimen Review Drug regimen was reviewed and remains appropriate with no significant issues identified  Home: Home Living Family/patient expects to be discharged to:: Private residence Living Arrangements: Spouse/significant other Available Help at Discharge: Family Type of Home: House Home Access: Ramped entrance [place]: One level Bathroom Shower/Tub: Chiropodist: [place]: Environmental consultant - 2 wheels, Tub bench, Cane - single point Additional Comments: Pt indicates spouse can assist her at discharge   Lives With: Spouse   Functional History: Prior Function Level of Independence: Needs assistance Gait / Transfers Assistance Needed: Pt reports she  was ambulating with RW  ADL's / Homemaking Assistance Needed: Pt reports she was performing ADLs mod I, and that she was driving and doing light house cleaning, including mopping floors.  No family present to clarify info   Functional Status:  Mobility: Bed Mobility Overal bed mobility: Needs Assistance Bed Mobility: Supine to Sit, Sit to Supine Supine to sit: Min assist Sit to supine: Min assist General bed mobility comments: requires assist to lift trunk and assist to lift LEs onto bed  Transfers Overall transfer level: Needs assistance Transfers: Sit to/from Stand, Stand Pivot Transfers Sit to Stand: Min assist Stand pivot transfers: Min assist General transfer comment: Pt with poor walker safety.  She tends to push walker very far in front of her, and leans anteriorly      ADL: ADL Overall ADL's : Needs assistance/impaired Eating/Feeding: Set up, Bed level Grooming: Wash/dry hands, Min guard, Standing Upper Body Bathing: Minimal assistance, Sitting Lower Body Bathing: Moderate assistance, Sit to/from stand Upper Body Dressing : Minimal assistance, Sitting Lower Body Dressing: Moderate assistance, Sit to/from stand Lower Body Dressing Details (indicate cue type and reason): frequently drops socks  Toilet Transfer: Minimal assistance, RW Toilet Transfer Details (indicate cue type and reason): mod verbal cues for walker safety  Toileting- Clothing Manipulation and Hygiene: Minimal assistance, Sit to/from stand Functional mobility during ADLs: Minimal assistance, Rolling walker  Cognition: Cognition Overall Cognitive Status: Impaired/Different from baseline Arousal/Alertness: Awake/alert Orientation Level: Disoriented X4 Cognition Arousal/Alertness: Awake/alert, Lethargic Behavior During Therapy: WFL for tasks assessed/performed Overall Cognitive Status: Impaired/Different from baseline Area of Impairment: Attention, Following commands, Problem solving, Awareness Current  Attention Level: Sustained Following Commands: Follows one step commands consistently, Follows one step commands inconsistently Awareness: Intellectual Problem Solving: Slow processing, Difficulty sequencing, Requires verbal cues, Requires tactile cues General Comments: Pt asked for assist opening towlette.  It was handed back to her unfolded, and she proceeded to attempt to further unfold it.   When it was explained that it was fully unfolded, she continued attempts to unfoled it.   She was heavily distracted by food droppings on the bed, reaching fully across body and forward (  while standing) in attempts to cleaning them up.  She is slow to respond, and required mod cues for RW safety.  The Short Blessed Test was administered with pt scoring 2/28- 1 error with sequencing   Physical Exam: Blood pressure (!) 168/106, pulse 74, temperature 97.7 F (36.5 C), temperature source Axillary, resp. rate 17, height 5\' 3"  (1.6 m), weight 50 kg, SpO2 99 %. Physical Exam  Constitutional: She appears well-developed. No distress.  HENT:  Head: Normocephalic and atraumatic.  Eyes: Pupils are equal, round, and reactive to light. Left eye exhibits no discharge.  Neck: Normal range of motion. No thyromegaly present.  Cardiovascular: Normal rate. Exam reveals no friction rub.  No murmur heard. IRR IRR  Respiratory: Effort normal. No respiratory distress. She has no wheezes.  GI: Soft. She exhibits no distension. There is abdominal tenderness.  Musculoskeletal:        General: No deformity or edema.  Neurological:  Patient is sitting up in chair with son at bedside.  She is a bit sleepy but easily arousable.  Follows simple commands.  Oriented to person, place, reason she's here with extra time. LUE 3+ to 4-/5, LLE 3+ to 4-/5 prox to 4/5 distally. RUE and RLE 4/5. Senses pain and light touch in all 4's. No focal CN deficits. Delayed procesing, decreased insight and awareness. .  Skin:  Missing 3rd and 5th toes  right foot and 3rd toe left foot. bpg scars LLE  Psychiatric:  Flat, but generally pleasant and cooperative    Results for orders placed or performed during the hospital encounter of 06/26/19 (from the past 48 hour(s))  Protime-INR     Status: Abnormal   Collection Time: 06/26/19  7:58 PM  Result Value Ref Range   Prothrombin Time 22.4 (H) 11.4 - 15.2 seconds   INR 2.0 (H) 0.8 - 1.2    Comment: (NOTE) INR goal varies based on device and disease states. Performed at [facility], [place] [address], [place], [place] [address]   APTT     Status: Abnormal   Collection Time: 06/26/19  7:58 PM  Result Value Ref Range   aPTT 41 (H) 24 - 36 seconds    Comment:        IF BASELINE aPTT IS ELEVATED, SUGGEST PATIENT RISK ASSESSMENT BE USED TO DETERMINE APPROPRIATE ANTICOAGULANT THERAPY. Performed at [facility], [place] [address], [place], [place] [address]   CBC     Status: Abnormal   Collection Time: 06/26/19  7:58 PM  Result Value Ref Range   WBC 8.1 4.0 - 10.5 K/uL   RBC 5.48 (H) 3.87 - 5.11 MIL/uL   Hemoglobin 17.6 (H) 12.0 - 15.0 g/dL   HCT 53.2 (H) 36.0 - 46.0 %   MCV 97.1 80.0 - 100.0 fL   MCH 32.1 26.0 - 34.0 pg   MCHC 33.1 30.0 - 36.0 g/dL   RDW 12.6 11.5 - 15.5 %   Platelets 197 150 - 400 K/uL   nRBC 0.0 0.0 - 0.2 %    Comment: Performed at [facility], [place] [address] [place], [place] [address]  Differential     Status: None   Collection Time: 06/26/19  7:58 PM  Result Value Ref Range   Neutrophils Relative % 71 %   Neutro Abs 5.8 1.7 - 7.7 K/uL   Lymphocytes Relative 17 %   Lymphs Abs 1.4 0.7 - 4.0 K/uL   Monocytes Relative 9 %   Monocytes Absolute  0.7 0.1 - 1.0 K/uL   Eosinophils Relative 2 %   Eosinophils Absolute 0.1 0.0 - 0.5 K/uL   Basophils Relative 1 %   Basophils Absolute 0.0 0.0 - 0.1 K/uL   Immature Granulocytes 0 %   Abs Immature Granulocytes 0.02 0.00 - 0.07 K/uL    Comment: Performed at [facility] [address]  [place], [place] [address]  Comprehensive metabolic panel     Status: Abnormal   Collection Time: 06/26/19  7:58 PM  Result Value Ref Range   Sodium 140 135 - 145 mmol/L   Potassium 4.5 3.5 - 5.1 mmol/L   Chloride 104 98 - 111 mmol/L   CO2 26 22 - 32 mmol/L   Glucose, Bld 148 (H) 70 - 99 mg/dL   BUN 21 8 - 23 mg/dL   Creatinine, Ser 0.77 0.44 - 1.00 mg/dL   Calcium 9.9 8.9 - 10.3 mg/dL   Total Protein 7.4 6.5 - 8.1 g/dL   Albumin 4.1 3.5 - 5.0 g/dL   AST 21 15 - 41 U/L   ALT 20 0 - 44 U/L   Alkaline Phosphatase 66 38 - 126 U/L   Total Bilirubin 0.9 0.3 - 1.2 mg/dL   GFR calc non Af Amer >60 >60 mL/min   GFR calc Af Amer >60 >60 mL/min   Anion gap 10 5 - 15    Comment: Performed at [facility], [place] [address] [place], [place] [address]  SARS Coronavirus 2 [facility] order, Performed in [facility] hospital lab) Nasopharyngeal Nasopharyngeal Swab     Status: None   Collection Time: 06/26/19  9:40 PM   Specimen: Nasopharyngeal Swab  Result Value Ref Range   SARS Coronavirus 2 NEGATIVE NEGATIVE    Comment: (NOTE) If result is NEGATIVE SARS-CoV-2 target nucleic acids are NOT DETECTED. The SARS-CoV-2 RNA is generally detectable in upper and lower  respiratory specimens during the acute phase of infection. The lowest  concentration of SARS-CoV-2 viral copies this assay can detect is 250  copies / mL. A negative result does not preclude SARS-CoV-2 infection  and should not be used as the sole basis for treatment or other  patient management decisions.  A negative result may occur with  improper specimen collection / handling, submission of specimen other  than nasopharyngeal swab, presence of viral mutation(s) within the  areas targeted by this assay, and inadequate number of viral copies  (<250 copies / mL). A negative result must be combined with clinical  observations, patient history, and epidemiological information. If result is POSITIVE SARS-CoV-2 target nucleic acids are  DETECTED. The SARS-CoV-2 RNA is generally detectable in upper and lower  respiratory specimens dur ing the acute phase of infection.  Positive  results are indicative of active infection with SARS-CoV-2.  Clinical  correlation with patient history and other diagnostic information is  necessary to determine patient infection status.  Positive results do  not rule out bacterial infection or co-infection with other viruses. If result is PRESUMPTIVE POSTIVE SARS-CoV-2 nucleic acids MAY BE PRESENT.   A presumptive positive result was obtained on the submitted specimen  and confirmed on repeat testing.  While 2019 novel coronavirus  (SARS-CoV-2) nucleic acids may be present in the submitted sample  additional confirmatory testing may be necessary for epidemiological  and / or clinical management purposes  to differentiate between  SARS-CoV-2 and other Sarbecovirus currently known to infect humans.  If clinically indicated additional testing with an alternate test  methodology [id number]) is advised. The SARS-CoV-2 RNA is generally  detectable in upper and lower respiratory sp ecimens during the acute  phase of infection. The expected result is Negative. Fact Sheet for Patients:  StrictlyIdeas.no Fact Sheet for Healthcare Providers: BankingDealers.co.za This test is not yet approved or cleared by the [place] FDA and has been authorized for detection and/or diagnosis of SARS-CoV-2 by FDA under an Emergency Use Authorization (EUA).  This EUA will remain in effect (meaning this test can be used) for the duration of the COVID-19 declaration under Section 564(b)(1) of the Act, 21 U.S.C. section 360bbb-3(b)(1), unless the authorization is terminated or revoked sooner. Performed at [facility], [place] [address], [place], [place] [address]   Hemoglobin A1c     Status: Abnormal   Collection Time: 06/27/19  4:11 AM  Result Value Ref Range    Hgb A1c MFr Bld 7.4 (H) 4.8 - 5.6 %    Comment: (NOTE) Pre diabetes:          5.7%-6.4% Diabetes:              >6.4% Glycemic control for   <7.0% adults with diabetes    Mean Plasma Glucose 165.68 mg/dL    Comment: Performed at [facility] [address], [place], [place] [address]  Lipid panel     Status: Abnormal   Collection Time: 06/27/19  4:11 AM  Result Value Ref Range   Cholesterol 187 0 - 200 mg/dL   Triglycerides 170 (H) <150 mg/dL   HDL 27 (L) >40 mg/dL   Total CHOL/HDL Ratio 6.9 RATIO   VLDL 34 0 - 40 mg/dL   LDL Cholesterol 126 (H) 0 - 99 mg/dL    Comment:        Total Cholesterol/HDL:CHD Risk Coronary Heart Disease Risk Table                     Men   Women  1/2 Average Risk   3.4   3.3  Average Risk       5.0   4.4  2 X Average Risk   9.6   7.1  3 X Average Risk  23.4   11.0        Use the calculated Patient Ratio above and the CHD Risk Table to determine the patient's CHD Risk.        ATP III CLASSIFICATION (LDL):  <100     mg/dL   Optimal  100-129  mg/dL   Near or Above                    Optimal  130-159  mg/dL   Borderline  160-189  mg/dL   High  >190     mg/dL   Very High Performed at [facility] [address], [place], [place] [address]   Protime-INR     Status: Abnormal   Collection Time: 06/27/19  4:11 AM  Result Value Ref Range   Prothrombin Time 21.3 (H) 11.4 - 15.2 seconds   INR 1.9 (H) 0.8 - 1.2    Comment: (NOTE) INR goal varies based on device and disease states. Performed at [facility], [place] [address], [place], [place] [address]   Glucose, capillary     Status: Abnormal   Collection Time: 06/27/19  3:47 PM  Result Value Ref Range   Glucose-Capillary 158 (H) 70 - 99 mg/dL   Comment 1 Notify RN  Comment 2 Document in Chart   Glucose, capillary     Status: Abnormal   Collection Time: 06/27/19  9:28 PM  Result Value Ref Range   Glucose-Capillary 124 (H) 70 - 99 mg/dL  Glucose, capillary     Status:  Abnormal   Collection Time: 06/28/19  6:19 AM  Result Value Ref Range   Glucose-Capillary 213 (H) 70 - 99 mg/dL  CBC     Status: Abnormal   Collection Time: 06/28/19  6:36 AM  Result Value Ref Range   WBC 14.1 (H) 4.0 - 10.5 K/uL   RBC 5.63 (H) 3.87 - 5.11 MIL/uL   Hemoglobin 18.3 (H) 12.0 - 15.0 g/dL   HCT 53.3 (H) 36.0 - 46.0 %   MCV 94.7 80.0 - 100.0 fL   MCH 32.5 26.0 - 34.0 pg   MCHC 34.3 30.0 - 36.0 g/dL   RDW 12.5 11.5 - 15.5 %   Platelets 225 150 - 400 K/uL   nRBC 0.0 0.0 - 0.2 %    Comment: Performed at [facility], [address], [place], [place] [address]  Basic metabolic panel     Status: Abnormal   Collection Time: 06/28/19  6:36 AM  Result Value Ref Range   Sodium 140 135 - 145 mmol/L   Potassium 4.0 3.5 - 5.1 mmol/L   Chloride 105 98 - 111 mmol/L   CO2 17 (L) 22 - 32 mmol/L   Glucose, Bld 211 (H) 70 - 99 mg/dL   BUN 31 (H) 8 - 23 mg/dL   Creatinine, Ser 1.13 (H) 0.44 - 1.00 mg/dL   Calcium 9.1 8.9 - 10.3 mg/dL   GFR calc non Af Amer 43 (L) >60 mL/min   GFR calc Af Amer 50 (L) >60 mL/min   Anion gap 18 (H) 5 - 15    Comment: Performed at [facility], [place] [address], [place], [place] [address]   Ct Head Wo Contrast  Result Date: 06/26/2019 CLINICAL DATA:  Tingling in left hand. EXAM: CT HEAD WITHOUT CONTRAST TECHNIQUE: Contiguous axial images were obtained from the base of the skull through the vertex without intravenous contrast. COMPARISON:  October 13, 2007 FINDINGS: Brain: No subdural, epidural, or subarachnoid hemorrhage. Ventricles and sulci are unremarkable. Cerebellum, brainstem, and basal cisterns are normal. Mild white matter changes identified. No acute cortical ischemia or infarct is noted. No mass effect or midline shift is identified. Vascular: Calcified atherosclerosis is seen in the intracranial carotids. Skull: Normal. Negative for fracture or focal lesion. Sinuses/Orbits: Opacification of the right frontal sinus is identified.  Paranasal sinuses, mastoid air cells, and middle ears are otherwise normal. Other: None. IMPRESSION: 1. No acute intracranial abnormalities are identified. Electronically Signed   By: Dorise Bullion III M.D   On: 06/26/2019 19:55   Mr Angio Head Wo Contrast  Result Date: 06/27/2019 CLINICAL DATA:  Left-sided facial droop and slurred speech with left hand weakness. Three episodes in last 2 days. EXAM: MRI HEAD WITHOUT CONTRAST MRA HEAD WITHOUT CONTRAST TECHNIQUE: Multiplanar, multiecho pulse sequences of the brain and surrounding structures were obtained without intravenous contrast. Angiographic images of the head were obtained using MRA technique without contrast. COMPARISON:  Head CT 06/26/2019 Brain MRI 09/24/2009 FINDINGS: MRI HEAD FINDINGS BRAIN: There are 2-3 small areas of abnormal diffusion restriction within the posterior right temporal lobe (series 5, image 63). The white matter signal is normal for the patient's age. The cerebral and cerebellar volume are age-appropriate. There is no hydrocephalus.  The midline structures are normal. VASCULAR: There are 2 scattered foci of chronic microhemorrhage in the supratentorial brain. The major intracranial arterial and venous sinus flow voids are normal. SKULL AND UPPER CERVICAL SPINE: Calvarial bone marrow signal is normal. There is no skull base mass. The visualized upper cervical spine and soft tissues are normal. SINUSES/ORBITS: There are no fluid levels or advanced mucosal thickening. The mastoid air cells and middle ear cavities are free of fluid. The orbits are normal. MRA HEAD FINDINGS POSTERIOR CIRCULATION: --Vertebral arteries: Normal V4 segments. --Posterior inferior cerebellar arteries (PICA): The left PICA and AICA share a common origin. Normal origin of right PICA from the ipsilateral vertebral artery. --Anterior inferior cerebellar arteries (AICA): Patent origins from the basilar artery. --Basilar artery: Normal. --Superior cerebellar arteries:  Normal. --Posterior cerebral arteries (PCA): The right PCA is occluded at the P2-3 junction with distal reconstitution. The left PCA is occluded as short segment of the proximal P2 segment. The distal left PCA is patent. ANTERIOR CIRCULATION: --Intracranial internal carotid arteries: Normal. --Anterior cerebral arteries (ACA): Multifocal stenosis of the left anterior cerebral artery with areas of intermittent loss of normal flow related enhancement. --Middle cerebral arteries (MCA): Short segment loss of enhancement of the right middle cerebral artery distal M1 segment. The M2 branches are normal. The left M1 segment is normal. There is moderate stenosis of the left M2 inferior division. IMPRESSION: 1. 2-3 small foci of acute to early subacute ischemia within the posterior right temporal lobe, within the PCA territory. 2. 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 3. Short segment occlusion of the right MCA M1 segment, just proximal to the bifurcation, with normal M2 branches. 4. Moderate stenosis of the left M2 inferior division. Electronically Signed   By: Ulyses Jarred M.D.   On: 06/27/2019 00:42   Mr Brain Wo Contrast  Result Date: 06/27/2019 CLINICAL DATA:  Left-sided facial droop and slurred speech with left hand weakness. Three episodes in last 2 days. EXAM: MRI HEAD WITHOUT CONTRAST MRA HEAD WITHOUT CONTRAST TECHNIQUE: Multiplanar, multiecho pulse sequences of the brain and surrounding structures were obtained without intravenous contrast. Angiographic images of the head were obtained using MRA technique without contrast. COMPARISON:  Head CT 06/26/2019 Brain MRI 09/24/2009 FINDINGS: MRI HEAD FINDINGS BRAIN: There are 2-3 small areas of abnormal diffusion restriction within the posterior right temporal lobe (series 5, image 63). The white matter signal is normal for the patient's age. The cerebral and cerebellar volume  are age-appropriate. There is no hydrocephalus. The midline structures are normal. VASCULAR: There are 2 scattered foci of chronic microhemorrhage in the supratentorial brain. The major intracranial arterial and venous sinus flow voids are normal. SKULL AND UPPER CERVICAL SPINE: Calvarial bone marrow signal is normal. There is no skull base mass. The visualized upper cervical spine and soft tissues are normal. SINUSES/ORBITS: There are no fluid levels or advanced mucosal thickening. The mastoid air cells and middle ear cavities are free of fluid. The orbits are normal. MRA HEAD FINDINGS POSTERIOR CIRCULATION: --Vertebral arteries: Normal V4 segments. --Posterior inferior cerebellar arteries (PICA): The left PICA and AICA share a common origin. Normal origin of right PICA from the ipsilateral vertebral artery. --Anterior inferior cerebellar arteries (AICA): Patent origins from the basilar artery. --Basilar artery: Normal. --Superior cerebellar arteries: Normal. --Posterior cerebral arteries (PCA): The right PCA is occluded at the P2-3 junction with distal reconstitution. The left PCA is occluded as short  segment of the proximal P2 segment. The distal left PCA is patent. ANTERIOR CIRCULATION: --Intracranial internal carotid arteries: Normal. --Anterior cerebral arteries (ACA): Multifocal stenosis of the left anterior cerebral artery with areas of intermittent loss of normal flow related enhancement. --Middle cerebral arteries (MCA): Short segment loss of enhancement of the right middle cerebral artery distal M1 segment. The M2 branches are normal. The left M1 segment is normal. There is moderate stenosis of the left M2 inferior division. IMPRESSION: 1. 2-3 small foci of acute to early subacute ischemia within the posterior right temporal lobe, within the PCA territory. 2. 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 3. Short segment  occlusion of the right MCA M1 segment, just proximal to the bifurcation, with normal M2 branches. 4. Moderate stenosis of the left M2 inferior division. Electronically Signed   By: Ulyses Jarred M.D.   On: 06/27/2019 00:42   Vas US Carotid (at [place] Only)  Result Date: 06/27/2019 Carotid Arterial Duplex Study Indications:       TIA. Risk Factors:      Hypertension, hyperlipidemia, Diabetes, PAD. Comparison Study:  no prior Performing Technologist: Abram Sander RVS  Examination Guidelines: A complete evaluation includes B-mode imaging, spectral Doppler, color Doppler, and power Doppler as needed of all accessible portions of each vessel. Bilateral testing is considered an integral part of a complete examination. Limited examinations for reoccurring indications may be performed as noted.  Right Carotid Findings: +----------+--------+--------+--------+------------------------+--------+             PSV cm/s EDV cm/s Stenosis Describe                 Comments  +----------+--------+--------+--------+------------------------+--------+  CCA Prox   48       10                calcific and homogeneous           +----------+--------+--------+--------+------------------------+--------+  CCA Distal 38       11                homogeneous                        +----------+--------+--------+--------+------------------------+--------+  ICA Prox   31       11       1-39%    homogeneous                        +----------+--------+--------+--------+------------------------+--------+  ICA Distal 36       11                                                   +----------+--------+--------+--------+------------------------+--------+  ECA        42                                                            +----------+--------+--------+--------+------------------------+--------+ +----------+--------+-------+--------+-------------------+             PSV cm/s EDV cms Describe Arm Pressure (mmHG)   +----------+--------+-------+--------+-------------------+  Subclavian  45                                             +----------+--------+-------+--------+-------------------+ +---------+--------+--+--------+--+---------+  Vertebral PSV cm/s 42 EDV cm/s 13 Antegrade  +---------+--------+--+--------+--+---------+  Left Carotid Findings: +----------+--------+--------+--------+--------+--------+             PSV cm/s EDV cm/s Stenosis Describe Comments  +----------+--------+--------+--------+--------+--------+  CCA Prox   46       8                 calcific           +----------+--------+--------+--------+--------+--------+  CCA Distal 58       16                calcific           +----------+--------+--------+--------+--------+--------+  ICA Prox   59       13       1-39%    calcific           +----------+--------+--------+--------+--------+--------+  ICA Distal 45       17                                   +----------+--------+--------+--------+--------+--------+  ECA        74       11                                   +----------+--------+--------+--------+--------+--------+ +----------+--------+--------+--------+-------------------+  Subclavian PSV cm/s EDV cm/s Describe Arm Pressure (mmHG)  +----------+--------+--------+--------+-------------------+             66                                              +----------+--------+--------+--------+-------------------+ +---------+--------+--+--------+-+---------+  Vertebral PSV cm/s 30 EDV cm/s 8 Antegrade  +---------+--------+--+--------+-+---------+  Summary: Right Carotid: Velocities in the right ICA are consistent with a 1-39% stenosis. Left Carotid: Velocities in the left ICA are consistent with a 1-39% stenosis. Vertebrals: Bilateral vertebral arteries demonstrate antegrade flow. *See table(s) above for measurements and observations.  Electronically signed by Harold Barban MD on 06/27/2019 at 12:29:25 PM.    Final        Medical Problem List and Plan: 1.   Left-sided weakness with slurred speech secondary to acute early subacute ischemia within the posterior right temporal lobe within the PCA territory.  -admit to inpatient rehab 2.  Antithrombotics: -DVT/anticoagulation: Eliquis  -antiplatelet therapy: Aspirin 325 mg daily 3. Pain Management: Tylenol as needed 4. Mood: Provide emotional support  -antipsychotic agents: N/A 5. Neuropsych: This patient is capable of making decisions on her own behalf. 6. Skin/Wound Care: Routine skin checks 7. Fluids/Electrolytes/Nutrition: Routine in and outs with follow-up chemistries 8.  PAF.  Cardiac rate controlled and irregular on exam today.  Continue Eliquis 9.  Hypertension.  Permissive hypertension.  Toprol 12.5 mg daily.  Monitor with increased mobility and adjust regimen as needed 10.  Diabetes mellitus.  Hemoglobin A1c 7.4.  SSI.  Patient on Glucophage 500 mg twice daily prior to admission.  Resume as needed based on trends 11.  Hyperlipidemia.  Lipitor 12.  Peripheral vascular disease with history of left SFA angioplasty and stenting in the past.  Continue low-dose aspirin 13.  Decreased nutritional storage.  Dietary follow-up.  Gentle IV fluids.  -protein supp     Cathlyn Parsons, PA-C 06/28/2019

## 2019-06-28 NOTE — Progress Notes (Deleted)
Physician Discharge Summary  [name] HBZ:[id number] DOB: 24-Feb-1928 DOA: 06/26/2019  PCP: Leanna Battles, MD  Admit date: 06/26/2019 Discharge date: 06/28/2019  Admitted From: Home  Disposition:  Inpatient rehab   Recommendations for Discharge Follow-up:  1. Follow up with PCP in 1-2 weeks after discharge from rehab for BP check and hospital F/u 2. Follow up with Neurology in 2-4 weeks after discharge from rehab, referral sent 3. On discharge from rehab: please prescribe new Eliquis, atorvastatin and any new BP medicines     Home Health: N/A  Equipment/Devices: TBD at CIR  Discharge Condition: Fair  CODE STATUS: DO NOT RESUSCITATE Diet recommendation: Cardiac  Brief/Interim Summary: [name] is a 85 y.o. F with hx pAF on warfarin, PVD s/p left SFA stent, DM, HTN, and DJD of spine who presented with recurrent brief episodes of transient slurred speech, left hand weakness, and facial droop for 2 days.  In the ER, CT head was unremarkable.        PRINCIPAL HOSPITAL DIAGNOSIS:  Acute right PCA stroke   Discharge Diagnoses:   Acute right PCA infarct Admitted with stuttering left sided hemiparesis and slurred speech.  CT head normal, MRI showed 2-3 small foci of acute to early subacute ischemia within the posterior right temporal lobe, within the PCA territory. -Non-invasive angiography showed "short segment occlusions of both posterior cerebral arteries, the P2-3 junction on the right and proximal P2 segment on the left.  Short segment occlusion of the right MCA M1 segment, just proximal to the bifurcation, with normal M2 branches. Moderate stenosis of the left M2 inferior division." -Echocardiogram showed no cardiogenic source of embolism -Carotid imaging unremarkable   -Lipids ordered LDL 126 --> started on new Lipitor -Aspirin ordered at admission  --> Neurology recommend aspirin 325 in addition to anticoagulation -Atrial fibrillation: present prior to admission  --> INR subtheraupeutic at admission, warfarin was stopped and transitioned to Eliquis -tPA not given because outside window for intervention -Dysphagia screen ordered in ER -PT eval ordered: recommended CIR   Delirium Patient's son describes that she has memory impairment noticeable to family, and has begun to lose some IADLs.  In the hospital, she was somewhat agitated overnight, was given Lorazepam and became disoriented and agitated.   Peripheral vascular disease -Continue aspirin 325 and new statin  Diabetes On metformin only at baseline.    Atrial fibrillation, paroxysmal Warfarin changed to Eliquis at discharge.  Metoprolol continued.  Hypertension BP poorly controlled.  Amlodipine started, discussed with Dr. Naaman Plummer from [place]             Discharge Instructions   Allergies as of 06/28/2019      Reactions   Amprenavir Other (See Comments)   Unknown reaction   Bactrim [sulfamethoxazole-trimethoprim] Nausea Only   Ceftriaxone Sodium Other (See Comments)   Tingly feeling after given, then one hour later while vanco infusing pruritic rash   Phenergan [promethazine Hcl] Other (See Comments)   Hallucinations   Sulfonamide Derivatives Hives   Vancomycin Hcl Rash   Rash happened 1hr after rocephin but immediately after infusing vancomycin      Medication List    STOP taking these medications   docusate sodium 100 MG capsule Commonly known as: COLACE   pantoprazole 40 MG tablet Commonly known as: PROTONIX   warfarin 2 MG tablet Commonly known as: COUMADIN     TAKE these medications   acetaminophen 325 MG tablet Commonly known as: TYLENOL Take 650 mg by mouth daily with breakfast.  amLODipine 5 MG tablet Commonly known as: NORVASC Take 1 tablet (5 mg total) by mouth daily.   apixaban 2.5 MG Tabs tablet Commonly known as: ELIQUIS Take 1 tablet (2.5 mg total) by mouth 2 (two) times daily.   aspirin EC 325 MG tablet Take 325 mg by mouth daily  with supper.   atorvastatin 40 MG tablet Commonly known as: LIPITOR Take 1 tablet (40 mg total) by mouth daily at 6 PM.   calcium carbonate 1500 (600 Ca) MG Tabs tablet Commonly known as: OSCAL Take 600 mg of elemental calcium by mouth daily with breakfast.   ergocalciferol 1.25 MG (50000 UT) capsule Commonly known as: VITAMIN D2 Take 50,000 Units by mouth every Sunday.   ferrous sulfate 325 (65 FE) MG tablet Take 325 mg by mouth daily with breakfast.   metFORMIN 500 MG 24 hr tablet Commonly known as: GLUCOPHAGE-XR Take 500 mg by mouth daily with supper. What changed: Another medication with the same name was removed. Continue taking this medication, and follow the directions you see here.   metoprolol succinate 25 MG 24 hr tablet Commonly known as: TOPROL-XL Take 25 mg by mouth daily with supper.   vitamin B-12 1000 MCG tablet Commonly known as: CYANOCOBALAMIN Take 1,000 mcg by mouth daily with breakfast.       Allergies  Allergen Reactions  . Amprenavir Other (See Comments)    Unknown reaction  . Bactrim [Sulfamethoxazole-Trimethoprim] Nausea Only  . Ceftriaxone Sodium Other (See Comments)    Tingly feeling after given, then one hour later while vanco infusing pruritic rash  . Phenergan [Promethazine Hcl] Other (See Comments)    Hallucinations  . Sulfonamide Derivatives Hives  . Vancomycin Hcl Rash    Rash happened 1hr after rocephin but immediately after infusing vancomycin    Consultations:  Neurology   Procedures/Studies: Ct Head Wo Contrast  Result Date: 06/26/2019 CLINICAL DATA:  Tingling in left hand. EXAM: CT HEAD WITHOUT CONTRAST TECHNIQUE: Contiguous axial images were obtained from the base of the skull through the vertex without intravenous contrast. COMPARISON:  October 13, 2007 FINDINGS: Brain: No subdural, epidural, or subarachnoid hemorrhage. Ventricles and sulci are unremarkable. Cerebellum, brainstem, and basal cisterns are normal. Mild white  matter changes identified. No acute cortical ischemia or infarct is noted. No mass effect or midline shift is identified. Vascular: Calcified atherosclerosis is seen in the intracranial carotids. Skull: Normal. Negative for fracture or focal lesion. Sinuses/Orbits: Opacification of the right frontal sinus is identified. Paranasal sinuses, mastoid air cells, and middle ears are otherwise normal. Other: None. IMPRESSION: 1. No acute intracranial abnormalities are identified. Electronically Signed   By: Dorise Bullion III M.D   On: 06/26/2019 19:55   Mr Angio Head Wo Contrast  Result Date: 06/27/2019 CLINICAL DATA:  Left-sided facial droop and slurred speech with left hand weakness. Three episodes in last 2 days. EXAM: MRI HEAD WITHOUT CONTRAST MRA HEAD WITHOUT CONTRAST TECHNIQUE: Multiplanar, multiecho pulse sequences of the brain and surrounding structures were obtained without intravenous contrast. Angiographic images of the head were obtained using MRA technique without contrast. COMPARISON:  Head CT 06/26/2019 Brain MRI 09/24/2009 FINDINGS: MRI HEAD FINDINGS BRAIN: There are 2-3 small areas of abnormal diffusion restriction within the posterior right temporal lobe (series 5, image 63). The white matter signal is normal for the patient's age. The cerebral and cerebellar volume are age-appropriate. There is no hydrocephalus. The midline structures are normal. VASCULAR: There are 2 scattered foci of chronic microhemorrhage in the supratentorial brain.  The major intracranial arterial and venous sinus flow voids are normal. SKULL AND UPPER CERVICAL SPINE: Calvarial bone marrow signal is normal. There is no skull base mass. The visualized upper cervical spine and soft tissues are normal. SINUSES/ORBITS: There are no fluid levels or advanced mucosal thickening. The mastoid air cells and middle ear cavities are free of fluid. The orbits are normal. MRA HEAD FINDINGS POSTERIOR CIRCULATION: --Vertebral arteries: Normal  V4 segments. --Posterior inferior cerebellar arteries (PICA): The left PICA and AICA share a common origin. Normal origin of right PICA from the ipsilateral vertebral artery. --Anterior inferior cerebellar arteries (AICA): Patent origins from the basilar artery. --Basilar artery: Normal. --Superior cerebellar arteries: Normal. --Posterior cerebral arteries (PCA): The right PCA is occluded at the P2-3 junction with distal reconstitution. The left PCA is occluded as short segment of the proximal P2 segment. The distal left PCA is patent. ANTERIOR CIRCULATION: --Intracranial internal carotid arteries: Normal. --Anterior cerebral arteries (ACA): Multifocal stenosis of the left anterior cerebral artery with areas of intermittent loss of normal flow related enhancement. --Middle cerebral arteries (MCA): Short segment loss of enhancement of the right middle cerebral artery distal M1 segment. The M2 branches are normal. The left M1 segment is normal. There is moderate stenosis of the left M2 inferior division. IMPRESSION: 1. 2-3 small foci of acute to early subacute ischemia within the posterior right temporal lobe, within the PCA territory. 2. 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 3. Short segment occlusion of the right MCA M1 segment, just proximal to the bifurcation, with normal M2 branches. 4. Moderate stenosis of the left M2 inferior division. Electronically Signed   By: Ulyses Jarred M.D.   On: 06/27/2019 00:42   Mr Brain Wo Contrast  Result Date: 06/27/2019 CLINICAL DATA:  Left-sided facial droop and slurred speech with left hand weakness. Three episodes in last 2 days. EXAM: MRI HEAD WITHOUT CONTRAST MRA HEAD WITHOUT CONTRAST TECHNIQUE: Multiplanar, multiecho pulse sequences of the brain and surrounding structures were obtained without intravenous contrast. Angiographic images of the head were obtained using MRA technique  without contrast. COMPARISON:  Head CT 06/26/2019 Brain MRI 09/24/2009 FINDINGS: MRI HEAD FINDINGS BRAIN: There are 2-3 small areas of abnormal diffusion restriction within the posterior right temporal lobe (series 5, image 63). The white matter signal is normal for the patient's age. The cerebral and cerebellar volume are age-appropriate. There is no hydrocephalus. The midline structures are normal. VASCULAR: There are 2 scattered foci of chronic microhemorrhage in the supratentorial brain. The major intracranial arterial and venous sinus flow voids are normal. SKULL AND UPPER CERVICAL SPINE: Calvarial bone marrow signal is normal. There is no skull base mass. The visualized upper cervical spine and soft tissues are normal. SINUSES/ORBITS: There are no fluid levels or advanced mucosal thickening. The mastoid air cells and middle ear cavities are free of fluid. The orbits are normal. MRA HEAD FINDINGS POSTERIOR CIRCULATION: --Vertebral arteries: Normal V4 segments. --Posterior inferior cerebellar arteries (PICA): The left PICA and AICA share a common origin. Normal origin of right PICA from the ipsilateral vertebral artery. --Anterior inferior cerebellar arteries (AICA): Patent origins from the basilar artery. --Basilar artery: Normal. --Superior cerebellar arteries: Normal. --Posterior cerebral arteries (PCA): The right PCA is occluded at the P2-3 junction with distal reconstitution. The left PCA is occluded as short segment of the proximal P2 segment. The distal left PCA is patent. ANTERIOR CIRCULATION: --Intracranial internal carotid arteries:  Normal. --Anterior cerebral arteries (ACA): Multifocal stenosis of the left anterior cerebral artery with areas of intermittent loss of normal flow related enhancement. --Middle cerebral arteries (MCA): Short segment loss of enhancement of the right middle cerebral artery distal M1 segment. The M2 branches are normal. The left M1 segment is normal. There is moderate  stenosis of the left M2 inferior division. IMPRESSION: 1. 2-3 small foci of acute to early subacute ischemia within the posterior right temporal lobe, within the PCA territory. 2. 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 3. Short segment occlusion of the right MCA M1 segment, just proximal to the bifurcation, with normal M2 branches. 4. Moderate stenosis of the left M2 inferior division. Electronically Signed   By: Ulyses Jarred M.D.   On: 06/27/2019 00:42   Vas US Carotid (at [place] Only)  Result Date: 06/27/2019 Carotid Arterial Duplex Study Indications:       TIA. Risk Factors:      Hypertension, hyperlipidemia, Diabetes, PAD. Comparison Study:  no prior Performing Technologist: Abram Sander RVS  Examination Guidelines: A complete evaluation includes B-mode imaging, spectral Doppler, color Doppler, and power Doppler as needed of all accessible portions of each vessel. Bilateral testing is considered an integral part of a complete examination. Limited examinations for reoccurring indications may be performed as noted.  Right Carotid Findings: +----------+--------+--------+--------+------------------------+--------+           PSV cm/sEDV cm/sStenosisDescribe                Comments +----------+--------+--------+--------+------------------------+--------+ CCA Prox  48      10              calcific and homogeneous         +----------+--------+--------+--------+------------------------+--------+ CCA Distal38      11              homogeneous                      +----------+--------+--------+--------+------------------------+--------+ ICA Prox  31      11      1-39%   homogeneous                      +----------+--------+--------+--------+------------------------+--------+ ICA Distal36      11                                                +----------+--------+--------+--------+------------------------+--------+ ECA       42                                                       +----------+--------+--------+--------+------------------------+--------+ +----------+--------+-------+--------+-------------------+           PSV cm/sEDV cmsDescribeArm Pressure (mmHG) +----------+--------+-------+--------+-------------------+ [id number]                                         +----------+--------+-------+--------+-------------------+ +---------+--------+--+--------+--+---------+ VertebralPSV cm/s42EDV cm/s13Antegrade +---------+--------+--+--------+--+---------+  Left Carotid Findings: +----------+--------+--------+--------+--------+--------+           PSV cm/sEDV cm/sStenosisDescribeComments +----------+--------+--------+--------+--------+--------+ CCA Prox  46      8               calcific         +----------+--------+--------+--------+--------+--------+ CCA Distal58      16              calcific         +----------+--------+--------+--------+--------+--------+ ICA Prox  59      13      1-39%   calcific         +----------+--------+--------+--------+--------+--------+ ICA Distal45      17                               +----------+--------+--------+--------+--------+--------+ ECA       74      11                               +----------+--------+--------+--------+--------+--------+ +----------+--------+--------+--------+-------------------+ SubclavianPSV cm/sEDV cm/sDescribeArm Pressure (mmHG) +----------+--------+--------+--------+-------------------+           66                                          +----------+--------+--------+--------+-------------------+ +---------+--------+--+--------+-+---------+ VertebralPSV cm/s30EDV cm/s8Antegrade +---------+--------+--+--------+-+---------+  Summary: Right Carotid: Velocities in the right ICA are consistent with a 1-39%  stenosis. Left Carotid: Velocities in the left ICA are consistent with a 1-39% stenosis. Vertebrals: Bilateral vertebral arteries demonstrate antegrade flow. *See table(s) above for measurements and observations.  Electronically signed by Harold Barban MD on 06/27/2019 at 12:29:25 PM.    Final       Subjective: Feels very sleepy.  Somewhat slowed mentation.  Recognizes son  Discharge Exam: Vitals:   06/28/19 0546 06/28/19 0728  BP: (!) 152/107 (!) 168/106  Pulse: (!) 152 74  Resp:  17  Temp:  97.7 F (36.5 C)  SpO2: 98% 99%   Vitals:   06/28/19 0230 06/28/19 0350 06/28/19 0546 06/28/19 0728  BP: (!) 197/107 (!) 162/116 (!) 152/107 (!) 168/106  Pulse:  (!) 113 (!) 152 74  Resp:  16  17  Temp:  98 F (36.7 C)  97.7 F (36.5 C)  TempSrc:  Oral  Axillary  SpO2:  99% 98% 99%  Weight:      Height:        General: Pt is awake, not in acute distress, sitting in chair Cardiovascular: RRR, nl S1-S2, no murmurs appreciated.   No LE edema.   Respiratory: Normal respiratory rate and rhythm.  CTAB without rales or wheezes. Abdominal: Abdomen soft and non-tender.  No distension or HSM.   Neuro/Psych: Strength symmetric in upper and lower extremities (RUE movement affected by shoulder immobility, chronic).  Judgment and insight appear moderately impaired, psychomotor slowing noted.   The results of significant diagnostics from this hospitalization (including imaging, microbiology, ancillary and laboratory) are listed below for reference.     Microbiology: Recent Results (from the past 240 hour(s))  SARS Coronavirus 2 [facility] order, Performed in [facility] hospital lab) Nasopharyngeal Nasopharyngeal Swab     Status: None   Collection Time: 06/26/19  9:40 PM   Specimen: Nasopharyngeal Swab  Result Value Ref Range Status   SARS Coronavirus 2 NEGATIVE NEGATIVE Final    Comment: (NOTE) If result is NEGATIVE SARS-CoV-2 target nucleic acids are NOT DETECTED. The SARS-CoV-2  RNA is  generally detectable in upper and lower  respiratory specimens during the acute phase of infection. The lowest  concentration of SARS-CoV-2 viral copies this assay can detect is 250  copies / mL. A negative result does not preclude SARS-CoV-2 infection  and should not be used as the sole basis for treatment or other  patient management decisions.  A negative result may occur with  improper specimen collection / handling, submission of specimen other  than nasopharyngeal swab, presence of viral mutation(s) within the  areas targeted by this assay, and inadequate number of viral copies  (<250 copies / mL). A negative result must be combined with clinical  observations, patient history, and epidemiological information. If result is POSITIVE SARS-CoV-2 target nucleic acids are DETECTED. The SARS-CoV-2 RNA is generally detectable in upper and lower  respiratory specimens dur ing the acute phase of infection.  Positive  results are indicative of active infection with SARS-CoV-2.  Clinical  correlation with patient history and other diagnostic information is  necessary to determine patient infection status.  Positive results do  not rule out bacterial infection or co-infection with other viruses. If result is PRESUMPTIVE POSTIVE SARS-CoV-2 nucleic acids MAY BE PRESENT.   A presumptive positive result was obtained on the submitted specimen  and confirmed on repeat testing.  While 2019 novel coronavirus  (SARS-CoV-2) nucleic acids may be present in the submitted sample  additional confirmatory testing may be necessary for epidemiological  and / or clinical management purposes  to differentiate between  SARS-CoV-2 and other Sarbecovirus currently known to infect humans.  If clinically indicated additional testing with an alternate test  methodology [phone]) is advised. The SARS-CoV-2 RNA is generally  detectable in upper and lower respiratory sp ecimens during the acute  phase of  infection. The expected result is Negative. Fact Sheet for Patients:  StrictlyIdeas.no Fact Sheet for Healthcare Providers: BankingDealers.co.za This test is not yet approved or cleared by the [place] FDA and has been authorized for detection and/or diagnosis of SARS-CoV-2 by FDA under an Emergency Use Authorization (EUA).  This EUA will remain in effect (meaning this test can be used) for the duration of the COVID-19 declaration under Section 564(b)(1) of the Act, 21 U.S.C. section 360bbb-3(b)(1), unless the authorization is terminated or revoked sooner. Performed at [facility], [place] [address], [place], [place] [address]      Labs: BNP (last 3 results) No results for input(s): BNP in the last 8760 hours. Basic Metabolic Panel: Recent Labs  Lab 06/26/19 1958 06/28/19 0636  NA 140 140  K 4.5 4.0  CL 104 105  CO2 26 17*  GLUCOSE 148* 211*  BUN 21 31*  CREATININE 0.77 1.13*  CALCIUM 9.9 9.1   Liver Function Tests: Recent Labs  Lab 06/26/19 1958  AST 21  ALT 20  ALKPHOS 66  BILITOT 0.9  PROT 7.4  ALBUMIN 4.1   No results for input(s): LIPASE, AMYLASE in the last 168 hours. No results for input(s): AMMONIA in the last 168 hours. CBC: Recent Labs  Lab 06/26/19 1958 06/28/19 0636  WBC 8.1 14.1*  NEUTROABS 5.8  --   HGB 17.6* 18.3*  HCT 53.2* 53.3*  MCV 97.1 94.7  PLT 197 225   Cardiac Enzymes: No results for input(s): CKTOTAL, CKMB, CKMBINDEX, TROPONINI in the last 168 hours. BNP: Invalid input(s): POCBNP CBG: Recent Labs  Lab 06/27/19 1547 06/27/19 2128 06/28/19 0619  GLUCAP 158* 124* 213*   D-Dimer No results for  input(s): DDIMER in the last 72 hours. Hgb A1c Recent Labs    06/27/19 0411  HGBA1C 7.4*   Lipid Profile Recent Labs    06/27/19 0411  CHOL 187  HDL 27*  LDLCALC 126*  TRIG 170*  CHOLHDL 6.9   Thyroid function studies No results for input(s): TSH, T4TOTAL, T3FREE,  THYROIDAB in the last 72 hours.  Invalid input(s): FREET3 Anemia work up No results for input(s): VITAMINB12, FOLATE, FERRITIN, TIBC, IRON, RETICCTPCT in the last 72 hours. Urinalysis    Component Value Date/Time   COLORURINE YELLOW 12/18/2010 0925   APPEARANCEUR CLEAR 12/18/2010 0925   LABSPEC >=1.030 06/20/2018 1441   PHURINE 5.5 06/20/2018 1441   GLUCOSEU 100 (A) 06/20/2018 1441   HGBUR MODERATE (A) 06/20/2018 1441   BILIRUBINUR LARGE (A) 06/20/2018 1441   KETONESUR TRACE (A) 06/20/2018 1441   PROTEINUR 100 (A) 06/20/2018 1441   UROBILINOGEN 2.0 (H) 06/20/2018 1441   NITRITE NEGATIVE 06/20/2018 1441   LEUKOCYTESUR LARGE (A) 06/20/2018 1441   Sepsis Labs Invalid input(s): PROCALCITONIN,  WBC,  LACTICIDVEN Microbiology Recent Results (from the past 240 hour(s))  SARS Coronavirus 2 [facility] order, Performed in [facility] hospital lab) Nasopharyngeal Nasopharyngeal Swab     Status: None   Collection Time: 06/26/19  9:40 PM   Specimen: Nasopharyngeal Swab  Result Value Ref Range Status   SARS Coronavirus 2 NEGATIVE NEGATIVE Final    Comment: (NOTE) If result is NEGATIVE SARS-CoV-2 target nucleic acids are NOT DETECTED. The SARS-CoV-2 RNA is generally detectable in upper and lower  respiratory specimens during the acute phase of infection. The lowest  concentration of SARS-CoV-2 viral copies this assay can detect is 250  copies / mL. A negative result does not preclude SARS-CoV-2 infection  and should not be used as the sole basis for treatment or other  patient management decisions.  A negative result may occur with  improper specimen collection / handling, submission of specimen other  than nasopharyngeal swab, presence of viral mutation(s) within the  areas targeted by this assay, and inadequate number of viral copies  (<250 copies / mL). A negative result must be combined with clinical  observations, patient history, and epidemiological information. If result is  POSITIVE SARS-CoV-2 target nucleic acids are DETECTED. The SARS-CoV-2 RNA is generally detectable in upper and lower  respiratory specimens dur ing the acute phase of infection.  Positive  results are indicative of active infection with SARS-CoV-2.  Clinical  correlation with patient history and other diagnostic information is  necessary to determine patient infection status.  Positive results do  not rule out bacterial infection or co-infection with other viruses. If result is PRESUMPTIVE POSTIVE SARS-CoV-2 nucleic acids MAY BE PRESENT.   A presumptive positive result was obtained on the submitted specimen  and confirmed on repeat testing.  While 2019 novel coronavirus  (SARS-CoV-2) nucleic acids may be present in the submitted sample  additional confirmatory testing may be necessary for epidemiological  and / or clinical management purposes  to differentiate between  SARS-CoV-2 and other Sarbecovirus currently known to infect humans.  If clinically indicated additional testing with an alternate test  methodology [phone]) is advised. The SARS-CoV-2 RNA is generally  detectable in upper and lower respiratory sp ecimens during the acute  phase of infection. The expected result is Negative. Fact Sheet for Patients:  StrictlyIdeas.no Fact Sheet for Healthcare Providers: BankingDealers.co.za This test is not yet approved or cleared by the [place] FDA and has been authorized for detection  and/or diagnosis of SARS-CoV-2 by FDA under an Emergency Use Authorization (EUA).  This EUA will remain in effect (meaning this test can be used) for the duration of the COVID-19 declaration under Section 564(b)(1) of the Act, 21 U.S.C. section 360bbb-3(b)(1), unless the authorization is terminated or revoked sooner. Performed at [facility], [place] [address], [place], [place] [address]      Time coordinating discharge: 25  minutes      SIGNED:   Edwin Dada, MD  [facility] 06/28/2019, 11:42 AM

## 2019-06-28 NOTE — Discharge Summary (Signed)
Physician Discharge Summary  VICTOIRE DEANS DUK:[id number] DOB: 05-Apr-1928 DOA: 06/26/2019  PCP: Leanna Battles, MD  Admit date: 06/26/2019 Discharge date: 06/28/2019  Admitted From: Home  Disposition:  Inpatient rehab   Recommendations for Discharge Follow-up:  1. Follow up with PCP in 1-2 weeks after discharge from rehab for BP check and hospital F/u 2. Follow up with Neurology in 2-4 weeks after discharge from rehab, referral sent 3. On discharge from rehab: please prescribe new Eliquis, atorvastatin and any new BP medicines     Home Health: N/A  Equipment/Devices: TBD at CIR  Discharge Condition: Fair  CODE STATUS: DO NOT RESUSCITATE Diet recommendation: Cardiac  Brief/Interim Summary: Mrs. Pytel is a 85 y.o. F with hx pAF on warfarin, PVD s/p left SFA stent, DM, HTN, and DJD of spine who presented with recurrent brief episodes of transient slurred speech, left hand weakness, and facial droop for 2 days.  In the ER, CT head was unremarkable.        PRINCIPAL HOSPITAL DIAGNOSIS:  Acute right PCA stroke   Discharge Diagnoses:   Acute right PCA infarct Admitted with stuttering left sided hemiparesis and slurred speech.  CT head normal, MRI showed 2-3 small foci of acute to early subacute ischemia within the posterior right temporal lobe, within the PCA territory. -Non-invasive angiography showed "short segment occlusions of both posterior cerebral arteries, the P2-3 junction on the right and proximal P2 segment on the left.  Short segment occlusion of the right MCA M1 segment, just proximal to the bifurcation, with normal M2 branches. Moderate stenosis of the left M2 inferior division." -Echocardiogram showed no cardiogenic source of embolism -Carotid imaging unremarkable   -Lipids ordered LDL 126 --> started on new Lipitor -Aspirin ordered at admission  --> Neurology recommend aspirin 325 in addition to anticoagulation -Atrial fibrillation: present prior to admission  --> INR subtheraupeutic at admission, warfarin was stopped and transitioned to Eliquis -tPA not given because outside window for intervention -Dysphagia screen ordered in ER -PT eval ordered: recommended CIR   Delirium Patient's son describes that she has memory impairment noticeable to family, and has begun to lose some IADLs.  In the hospital, she was somewhat agitated overnight, was given Lorazepam and became disoriented and agitated.   Peripheral vascular disease -Continue aspirin 325 and new statin  Diabetes On metformin only at baseline.    Atrial fibrillation, paroxysmal Warfarin changed to Eliquis at discharge.  Metoprolol continued.  Hypertension BP poorly controlled.  Amlodipine started, discussed with Dr. Naaman Plummer from [facility]             Discharge Instructions   Allergies as of 06/28/2019      Reactions   Amprenavir Other (See Comments)   Unknown reaction   Bactrim [sulfamethoxazole-trimethoprim] Nausea Only   Ceftriaxone Sodium Other (See Comments)   Tingly feeling after given, then one hour later while vanco infusing pruritic rash   Phenergan [promethazine Hcl] Other (See Comments)   Hallucinations   Sulfonamide Derivatives Hives   Vancomycin Hcl Rash   Rash happened 1hr after rocephin but immediately after infusing vancomycin      Medication List    STOP taking these medications   docusate sodium 100 MG capsule Commonly known as: COLACE   pantoprazole 40 MG tablet Commonly known as: PROTONIX   warfarin 2 MG tablet Commonly known as: COUMADIN     TAKE these medications   acetaminophen 325 MG tablet Commonly known as: TYLENOL Take 650 mg by mouth daily with breakfast.  amLODipine 5 MG tablet Commonly known as: NORVASC Take 1 tablet (5 mg total) by mouth daily.   apixaban 2.5 MG Tabs tablet Commonly known as: ELIQUIS Take 1 tablet (2.5 mg total) by mouth 2 (two) times daily.   aspirin EC 325 MG tablet Take 325 mg by mouth daily  with supper.   atorvastatin 40 MG tablet Commonly known as: LIPITOR Take 1 tablet (40 mg total) by mouth daily at 6 PM.   calcium carbonate 1500 (600 Ca) MG Tabs tablet Commonly known as: OSCAL Take 600 mg of elemental calcium by mouth daily with breakfast.   ergocalciferol 1.25 MG (50000 UT) capsule Commonly known as: VITAMIN D2 Take 50,000 Units by mouth every Sunday.   ferrous sulfate 325 (65 FE) MG tablet Take 325 mg by mouth daily with breakfast.   metFORMIN 500 MG 24 hr tablet Commonly known as: GLUCOPHAGE-XR Take 500 mg by mouth daily with supper. What changed: Another medication with the same name was removed. Continue taking this medication, and follow the directions you see here.   metoprolol succinate 25 MG 24 hr tablet Commonly known as: TOPROL-XL Take 25 mg by mouth daily with supper.   vitamin B-12 1000 MCG tablet Commonly known as: CYANOCOBALAMIN Take 1,000 mcg by mouth daily with breakfast.       Allergies  Allergen Reactions  . Amprenavir Other (See Comments)    Unknown reaction  . Bactrim [Sulfamethoxazole-Trimethoprim] Nausea Only  . Ceftriaxone Sodium Other (See Comments)    Tingly feeling after given, then one hour later while vanco infusing pruritic rash  . Phenergan [Promethazine Hcl] Other (See Comments)    Hallucinations  . Sulfonamide Derivatives Hives  . Vancomycin Hcl Rash    Rash happened 1hr after rocephin but immediately after infusing vancomycin    Consultations:  Neurology   Procedures/Studies: Ct Head Wo Contrast  Result Date: 06/26/2019 CLINICAL DATA:  Tingling in left hand. EXAM: CT HEAD WITHOUT CONTRAST TECHNIQUE: Contiguous axial images were obtained from the base of the skull through the vertex without intravenous contrast. COMPARISON:  October 13, 2007 FINDINGS: Brain: No subdural, epidural, or subarachnoid hemorrhage. Ventricles and sulci are unremarkable. Cerebellum, brainstem, and basal cisterns are normal. Mild white  matter changes identified. No acute cortical ischemia or infarct is noted. No mass effect or midline shift is identified. Vascular: Calcified atherosclerosis is seen in the intracranial carotids. Skull: Normal. Negative for fracture or focal lesion. Sinuses/Orbits: Opacification of the right frontal sinus is identified. Paranasal sinuses, mastoid air cells, and middle ears are otherwise normal. Other: None. IMPRESSION: 1. No acute intracranial abnormalities are identified. Electronically Signed   By: Dorise Bullion III M.D   On: 06/26/2019 19:55   Mr Angio Head Wo Contrast  Result Date: 06/27/2019 CLINICAL DATA:  Left-sided facial droop and slurred speech with left hand weakness. Three episodes in last 2 days. EXAM: MRI HEAD WITHOUT CONTRAST MRA HEAD WITHOUT CONTRAST TECHNIQUE: Multiplanar, multiecho pulse sequences of the brain and surrounding structures were obtained without intravenous contrast. Angiographic images of the head were obtained using MRA technique without contrast. COMPARISON:  Head CT 06/26/2019 Brain MRI 09/24/2009 FINDINGS: MRI HEAD FINDINGS BRAIN: There are 2-3 small areas of abnormal diffusion restriction within the posterior right temporal lobe (series 5, image 63). The white matter signal is normal for the patient's age. The cerebral and cerebellar volume are age-appropriate. There is no hydrocephalus. The midline structures are normal. VASCULAR: There are 2 scattered foci of chronic microhemorrhage in the supratentorial brain.  The major intracranial arterial and venous sinus flow voids are normal. SKULL AND UPPER CERVICAL SPINE: Calvarial bone marrow signal is normal. There is no skull base mass. The visualized upper cervical spine and soft tissues are normal. SINUSES/ORBITS: There are no fluid levels or advanced mucosal thickening. The mastoid air cells and middle ear cavities are free of fluid. The orbits are normal. MRA HEAD FINDINGS POSTERIOR CIRCULATION: --Vertebral arteries: Normal  V4 segments. --Posterior inferior cerebellar arteries (PICA): The left PICA and AICA share a common origin. Normal origin of right PICA from the ipsilateral vertebral artery. --Anterior inferior cerebellar arteries (AICA): Patent origins from the basilar artery. --Basilar artery: Normal. --Superior cerebellar arteries: Normal. --Posterior cerebral arteries (PCA): The right PCA is occluded at the P2-3 junction with distal reconstitution. The left PCA is occluded as short segment of the proximal P2 segment. The distal left PCA is patent. ANTERIOR CIRCULATION: --Intracranial internal carotid arteries: Normal. --Anterior cerebral arteries (ACA): Multifocal stenosis of the left anterior cerebral artery with areas of intermittent loss of normal flow related enhancement. --Middle cerebral arteries (MCA): Short segment loss of enhancement of the right middle cerebral artery distal M1 segment. The M2 branches are normal. The left M1 segment is normal. There is moderate stenosis of the left M2 inferior division. IMPRESSION: 1. 2-3 small foci of acute to early subacute ischemia within the posterior right temporal lobe, within the PCA territory. 2. 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 3. Short segment occlusion of the right MCA M1 segment, just proximal to the bifurcation, with normal M2 branches. 4. Moderate stenosis of the left M2 inferior division. Electronically Signed   By: Ulyses Jarred M.D.   On: 06/27/2019 00:42   Mr Brain Wo Contrast  Result Date: 06/27/2019 CLINICAL DATA:  Left-sided facial droop and slurred speech with left hand weakness. Three episodes in last 2 days. EXAM: MRI HEAD WITHOUT CONTRAST MRA HEAD WITHOUT CONTRAST TECHNIQUE: Multiplanar, multiecho pulse sequences of the brain and surrounding structures were obtained without intravenous contrast. Angiographic images of the head were obtained using MRA technique  without contrast. COMPARISON:  Head CT 06/26/2019 Brain MRI 09/24/2009 FINDINGS: MRI HEAD FINDINGS BRAIN: There are 2-3 small areas of abnormal diffusion restriction within the posterior right temporal lobe (series 5, image 63). The white matter signal is normal for the patient's age. The cerebral and cerebellar volume are age-appropriate. There is no hydrocephalus. The midline structures are normal. VASCULAR: There are 2 scattered foci of chronic microhemorrhage in the supratentorial brain. The major intracranial arterial and venous sinus flow voids are normal. SKULL AND UPPER CERVICAL SPINE: Calvarial bone marrow signal is normal. There is no skull base mass. The visualized upper cervical spine and soft tissues are normal. SINUSES/ORBITS: There are no fluid levels or advanced mucosal thickening. The mastoid air cells and middle ear cavities are free of fluid. The orbits are normal. MRA HEAD FINDINGS POSTERIOR CIRCULATION: --Vertebral arteries: Normal V4 segments. --Posterior inferior cerebellar arteries (PICA): The left PICA and AICA share a common origin. Normal origin of right PICA from the ipsilateral vertebral artery. --Anterior inferior cerebellar arteries (AICA): Patent origins from the basilar artery. --Basilar artery: Normal. --Superior cerebellar arteries: Normal. --Posterior cerebral arteries (PCA): The right PCA is occluded at the P2-3 junction with distal reconstitution. The left PCA is occluded as short segment of the proximal P2 segment. The distal left PCA is patent. ANTERIOR CIRCULATION: --Intracranial internal carotid arteries:  Normal. --Anterior cerebral arteries (ACA): Multifocal stenosis of the left anterior cerebral artery with areas of intermittent loss of normal flow related enhancement. --Middle cerebral arteries (MCA): Short segment loss of enhancement of the right middle cerebral artery distal M1 segment. The M2 branches are normal. The left M1 segment is normal. There is moderate  stenosis of the left M2 inferior division. IMPRESSION: 1. 2-3 small foci of acute to early subacute ischemia within the posterior right temporal lobe, within the PCA territory. 2. 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 3. Short segment occlusion of the right MCA M1 segment, just proximal to the bifurcation, with normal M2 branches. 4. Moderate stenosis of the left M2 inferior division. Electronically Signed   By: Ulyses Jarred M.D.   On: 06/27/2019 00:42   Vas US Carotid (at [facility] Only)  Result Date: 06/27/2019 Carotid Arterial Duplex Study Indications:       TIA. Risk Factors:      Hypertension, hyperlipidemia, Diabetes, PAD. Comparison Study:  no prior Performing Technologist: Abram Sander RVS  Examination Guidelines: A complete evaluation includes B-mode imaging, spectral Doppler, color Doppler, and power Doppler as needed of all accessible portions of each vessel. Bilateral testing is considered an integral part of a complete examination. Limited examinations for reoccurring indications may be performed as noted.  Right Carotid Findings: +----------+--------+--------+--------+------------------------+--------+           PSV cm/sEDV cm/sStenosisDescribe                Comments +----------+--------+--------+--------+------------------------+--------+ CCA Prox  48      10              calcific and homogeneous         +----------+--------+--------+--------+------------------------+--------+ CCA Distal38      11              homogeneous                      +----------+--------+--------+--------+------------------------+--------+ ICA Prox  31      11      1-39%   homogeneous                      +----------+--------+--------+--------+------------------------+--------+ ICA Distal36      11                                                +----------+--------+--------+--------+------------------------+--------+ ECA       42                                                       +----------+--------+--------+--------+------------------------+--------+ +----------+--------+-------+--------+-------------------+           PSV cm/sEDV cmsDescribeArm Pressure (mmHG) +----------+--------+-------+--------+-------------------+ [id number]                                         +----------+--------+-------+--------+-------------------+ +---------+--------+--+--------+--+---------+ VertebralPSV cm/s42EDV cm/s13Antegrade +---------+--------+--+--------+--+---------+  Left Carotid Findings: +----------+--------+--------+--------+--------+--------+           PSV cm/sEDV cm/sStenosisDescribeComments +----------+--------+--------+--------+--------+--------+ CCA Prox  46      8               calcific         +----------+--------+--------+--------+--------+--------+ CCA Distal58      16              calcific         +----------+--------+--------+--------+--------+--------+ ICA Prox  59      13      1-39%   calcific         +----------+--------+--------+--------+--------+--------+ ICA Distal45      17                               +----------+--------+--------+--------+--------+--------+ ECA       74      11                               +----------+--------+--------+--------+--------+--------+ +----------+--------+--------+--------+-------------------+ SubclavianPSV cm/sEDV cm/sDescribeArm Pressure (mmHG) +----------+--------+--------+--------+-------------------+           66                                          +----------+--------+--------+--------+-------------------+ +---------+--------+--+--------+-+---------+ VertebralPSV cm/s30EDV cm/s8Antegrade +---------+--------+--+--------+-+---------+  Summary: Right Carotid: Velocities in the right ICA are consistent with a 1-39%  stenosis. Left Carotid: Velocities in the left ICA are consistent with a 1-39% stenosis. Vertebrals: Bilateral vertebral arteries demonstrate antegrade flow. *See table(s) above for measurements and observations.  Electronically signed by Harold Barban MD on 06/27/2019 at 12:29:25 PM.    Final       Subjective: Feels very sleepy.  Somewhat slowed mentation.  Recognizes son  Discharge Exam: Vitals:   06/28/19 0546 06/28/19 0728  BP: (!) 152/107 (!) 168/106  Pulse: (!) 152 74  Resp:  17  Temp:  97.7 F (36.5 C)  SpO2: 98% 99%   Vitals:   06/28/19 0230 06/28/19 0350 06/28/19 0546 06/28/19 0728  BP: (!) 197/107 (!) 162/116 (!) 152/107 (!) 168/106  Pulse:  (!) 113 (!) 152 74  Resp:  16  17  Temp:  98 F (36.7 C)  97.7 F (36.5 C)  TempSrc:  Oral  Axillary  SpO2:  99% 98% 99%  Weight:      Height:        General: Pt is awake, not in acute distress, sitting in chair Cardiovascular: RRR, nl S1-S2, no murmurs appreciated.   No LE edema.   Respiratory: Normal respiratory rate and rhythm.  CTAB without rales or wheezes. Abdominal: Abdomen soft and non-tender.  No distension or HSM.   Neuro/Psych: Strength symmetric in upper and lower extremities (RUE movement affected by shoulder immobility, chronic).  Judgment and insight appear moderately impaired, psychomotor slowing noted.   The results of significant diagnostics from this hospitalization (including imaging, microbiology, ancillary and laboratory) are listed below for reference.     Microbiology: Recent Results (from the past 240 hour(s))  SARS Coronavirus 2 [facility] order, Performed in [facility] hospital lab) Nasopharyngeal Nasopharyngeal Swab     Status: None   Collection Time: 06/26/19  9:40 PM   Specimen: Nasopharyngeal Swab  Result Value Ref Range Status   SARS Coronavirus 2 NEGATIVE NEGATIVE Final    Comment: (NOTE) If result is NEGATIVE SARS-CoV-2 target nucleic acids are NOT DETECTED. The SARS-CoV-2  RNA is  generally detectable in upper and lower  respiratory specimens during the acute phase of infection. The lowest  concentration of SARS-CoV-2 viral copies this assay can detect is 250  copies / mL. A negative result does not preclude SARS-CoV-2 infection  and should not be used as the sole basis for treatment or other  patient management decisions.  A negative result may occur with  improper specimen collection / handling, submission of specimen other  than nasopharyngeal swab, presence of viral mutation(s) within the  areas targeted by this assay, and inadequate number of viral copies  (<250 copies / mL). A negative result must be combined with clinical  observations, patient history, and epidemiological information. If result is POSITIVE SARS-CoV-2 target nucleic acids are DETECTED. The SARS-CoV-2 RNA is generally detectable in upper and lower  respiratory specimens dur ing the acute phase of infection.  Positive  results are indicative of active infection with SARS-CoV-2.  Clinical  correlation with patient history and other diagnostic information is  necessary to determine patient infection status.  Positive results do  not rule out bacterial infection or co-infection with other viruses. If result is PRESUMPTIVE POSTIVE SARS-CoV-2 nucleic acids MAY BE PRESENT.   A presumptive positive result was obtained on the submitted specimen  and confirmed on repeat testing.  While 2019 novel coronavirus  (SARS-CoV-2) nucleic acids may be present in the submitted sample  additional confirmatory testing may be necessary for epidemiological  and / or clinical management purposes  to differentiate between  SARS-CoV-2 and other Sarbecovirus currently known to infect humans.  If clinically indicated additional testing with an alternate test  methodology [phone]) is advised. The SARS-CoV-2 RNA is generally  detectable in upper and lower respiratory sp ecimens during the acute  phase of  infection. The expected result is Negative. Fact Sheet for Patients:  StrictlyIdeas.no Fact Sheet for Healthcare Providers: BankingDealers.co.za This test is not yet approved or cleared by the [place] FDA and has been authorized for detection and/or diagnosis of SARS-CoV-2 by FDA under an Emergency Use Authorization (EUA).  This EUA will remain in effect (meaning this test can be used) for the duration of the COVID-19 declaration under Section 564(b)(1) of the Act, 21 U.S.C. section 360bbb-3(b)(1), unless the authorization is terminated or revoked sooner. Performed at [facility], [place] [address] [place], [place] [address]      Labs: BNP (last 3 results) No results for input(s): BNP in the last 8760 hours. Basic Metabolic Panel: Recent Labs  Lab 06/26/19 1958 06/28/19 0636  NA 140 140  K 4.5 4.0  CL 104 105  CO2 26 17*  GLUCOSE 148* 211*  BUN 21 31*  CREATININE 0.77 1.13*  CALCIUM 9.9 9.1   Liver Function Tests: Recent Labs  Lab 06/26/19 1958  AST 21  ALT 20  ALKPHOS 66  BILITOT 0.9  PROT 7.4  ALBUMIN 4.1   No results for input(s): LIPASE, AMYLASE in the last 168 hours. No results for input(s): AMMONIA in the last 168 hours. CBC: Recent Labs  Lab 06/26/19 1958 06/28/19 0636  WBC 8.1 14.1*  NEUTROABS 5.8  --   HGB 17.6* 18.3*  HCT 53.2* 53.3*  MCV 97.1 94.7  PLT 197 225   Cardiac Enzymes: No results for input(s): CKTOTAL, CKMB, CKMBINDEX, TROPONINI in the last 168 hours. BNP: Invalid input(s): POCBNP CBG: Recent Labs  Lab 06/27/19 1547 06/27/19 2128 06/28/19 0619 06/28/19 1144  GLUCAP 158* 124* 213* 214*   D-Dimer  No results for input(s): DDIMER in the last 72 hours. Hgb A1c Recent Labs    06/27/19 0411  HGBA1C 7.4*   Lipid Profile Recent Labs    06/27/19 0411  CHOL 187  HDL 27*  LDLCALC 126*  TRIG 170*  CHOLHDL 6.9   Thyroid function studies No results for input(s):  TSH, T4TOTAL, T3FREE, THYROIDAB in the last 72 hours.  Invalid input(s): FREET3 Anemia work up No results for input(s): VITAMINB12, FOLATE, FERRITIN, TIBC, IRON, RETICCTPCT in the last 72 hours. Urinalysis    Component Value Date/Time   COLORURINE YELLOW 12/18/2010 0925   APPEARANCEUR CLEAR 12/18/2010 0925   LABSPEC >=1.030 06/20/2018 1441   PHURINE 5.5 06/20/2018 1441   GLUCOSEU 100 (A) 06/20/2018 1441   HGBUR MODERATE (A) 06/20/2018 1441   BILIRUBINUR LARGE (A) 06/20/2018 1441   KETONESUR TRACE (A) 06/20/2018 1441   PROTEINUR 100 (A) 06/20/2018 1441   UROBILINOGEN 2.0 (H) 06/20/2018 1441   NITRITE NEGATIVE 06/20/2018 1441   LEUKOCYTESUR LARGE (A) 06/20/2018 1441   Sepsis Labs Invalid input(s): PROCALCITONIN,  WBC,  LACTICIDVEN Microbiology Recent Results (from the past 240 hour(s))  SARS Coronavirus 2 [facility] order, Performed in [facility] hospital lab) Nasopharyngeal Nasopharyngeal Swab     Status: None   Collection Time: 06/26/19  9:40 PM   Specimen: Nasopharyngeal Swab  Result Value Ref Range Status   SARS Coronavirus 2 NEGATIVE NEGATIVE Final    Comment: (NOTE) If result is NEGATIVE SARS-CoV-2 target nucleic acids are NOT DETECTED. The SARS-CoV-2 RNA is generally detectable in upper and lower  respiratory specimens during the acute phase of infection. The lowest  concentration of SARS-CoV-2 viral copies this assay can detect is 250  copies / mL. A negative result does not preclude SARS-CoV-2 infection  and should not be used as the sole basis for treatment or other  patient management decisions.  A negative result may occur with  improper specimen collection / handling, submission of specimen other  than nasopharyngeal swab, presence of viral mutation(s) within the  areas targeted by this assay, and inadequate number of viral copies  (<250 copies / mL). A negative result must be combined with clinical  observations, patient history, and epidemiological  information. If result is POSITIVE SARS-CoV-2 target nucleic acids are DETECTED. The SARS-CoV-2 RNA is generally detectable in upper and lower  respiratory specimens dur ing the acute phase of infection.  Positive  results are indicative of active infection with SARS-CoV-2.  Clinical  correlation with patient history and other diagnostic information is  necessary to determine patient infection status.  Positive results do  not rule out bacterial infection or co-infection with other viruses. If result is PRESUMPTIVE POSTIVE SARS-CoV-2 nucleic acids MAY BE PRESENT.   A presumptive positive result was obtained on the submitted specimen  and confirmed on repeat testing.  While 2019 novel coronavirus  (SARS-CoV-2) nucleic acids may be present in the submitted sample  additional confirmatory testing may be necessary for epidemiological  and / or clinical management purposes  to differentiate between  SARS-CoV-2 and other Sarbecovirus currently known to infect humans.  If clinically indicated additional testing with an alternate test  methodology [phone]) is advised. The SARS-CoV-2 RNA is generally  detectable in upper and lower respiratory sp ecimens during the acute  phase of infection. The expected result is Negative. Fact Sheet for Patients:  StrictlyIdeas.no Fact Sheet for Healthcare Providers: BankingDealers.co.za This test is not yet approved or cleared by the [place] FDA and has been  authorized for detection and/or diagnosis of SARS-CoV-2 by FDA under an Emergency Use Authorization (EUA).  This EUA will remain in effect (meaning this test can be used) for the duration of the COVID-19 declaration under Section 564(b)(1) of the Act, 21 U.S.C. section 360bbb-3(b)(1), unless the authorization is terminated or revoked sooner. Performed at [facility], [place] [address] [place], [place] [address]      Time coordinating  discharge: 25 minutes      SIGNED:   Edwin Dada, MD  [facility] 06/28/2019, 4:27 PM   Note: This note was iniially erroneously filed as a progress note.

## 2019-06-28 NOTE — PMR Pre-admission (Signed)
PMR Admission Coordinator Pre-Admission Assessment  Patient: [name] is an 85 y.o., female MRN: [id number] DOB: 10-Oct-1928 Height: '5\' 3"'  (160 cm) Weight: 50 kg  Insurance Information HMO:     PPO:      PCP:      IPA:      80/20:      OTHER:  PRIMARY: Medicare A and B      Policy#: [id number]      Subscriber: patient CM Name:       Phone#:      Fax#:  Pre-Cert#: verified eligibility online      Employer:  Benefits:  Phone #:      Name:  Eff. Date: 07/20/1995     Deduct: $1408      Out of Pocket Max: n/a      Life Max: n/a CIR: 100%      SNF: 20 full days Outpatient:      Co-Pay: 20% Home Health: 100%      Co-Pay:  DME: 80%     Co-Pay: 20% Providers: pt choice  SECONDARY: BCBS Supplement     Policy#: [id number]      Subscriber:  CM Name:       Phone#:      Fax#:  Pre-Cert#:       Employer:  Benefits:  Phone #:      Name:  Eff. Date:      Deduct:       Out of Pocket Max:       Life Max:  CIR:       SNF:  Outpatient:      Co-Pay:  Home Health:       Co-Pay:  DME:      Co-Pay:   Medicaid Application Date:       Case Manager:  Disability Application Date:       Case Worker:   The "Data Collection Information Summary" for patients in Inpatient Rehabilitation Facilities with attached "Privacy Act [place] Records" was provided and verbally reviewed with: Patient and Family  Emergency Contact Information Contact Information    Name Relation Home Work Mobile   Nebo Spouse [phone]  [phone]   Schrager,Denise Relative [phone]     [facility] Relative [phone]  [phone]   [name], [name] [phone]  [phone]      Current Medical History  Patient Admitting Diagnosis: CVA   History of Present Illness: [name] is an 85 year old right-handed female with history of PAF maintained on Coumadin, peripheral vascular disease status post left SFA angioplasty and stenting, type 2 diabetes mellitus, hypertension, hyperlipidemia.   Presented 06/26/2019 with left-sided weakness and slurred speech that has been transient over 2 days.  No reports of chest pain or shortness of breath.  Blood pressure in the ED 143/95-213/112.  INR on admission of 2.00, COVID negative.  Cranial CT scan negative for acute changes.  Patient did not receive TPA.  MRI of the brain showed small foci of acute early subacute ischemia within the posterior right temporal lobe within the PCA territory.  MR angiogram showed short segment occlusion of both posterior cerebral arteries, the P2-3 junction on the right and proximal P2 segment on the left.  Short segment occlusion of the right MCA M1 segment just proximal to the bifurcation with normal M2 branches.  Carotid Dopplers with no significant ICA stenosis.  Echocardiogram with ejection fraction of 71% normal systolic function.  Neurology consulted Coumadin transition to Eliquis as well as remaining on aspirin  therapy.  Tolerating a regular consistency diet.  Appetite has been limited started on gentle IV fluids.  Therapy evaluations completed and patient requiring overall min to mod assist for mobility and ADLs.   Complete NIHSS TOTAL: 0  Patient's medical record from [facility] has been reviewed by the rehabilitation admission coordinator and physician.  Past Medical History  Past Medical History:  Diagnosis Date  . Acute osteomyelitis ([place]) 08/25/2018   Right toe  . Anemia   . Atrial fibrillation ([place])   . Carpal tunnel syndrome, bilateral   . Cellulitis 08/25/2018   Right foot  . Cervical lymphadenopathy   . Compression fracture of fifth lumbar vertebra (H[facility])    to T 11  . DDD (degenerative disc disease), lumbar   . DJD (degenerative joint disease)   . DOE (dyspnea on exertion)   . Dysphagia   . Dyspnea    with exertion  . Dysrhythmia    hx brief AF post op hip surg  . Esophageal dysmotility 01/31/2014   noted barium  . GERD (gastroesophageal reflux disease)   . Hematoma    right  side after stent placement  . History of hiatal hernia 01/31/2014   Small, noted on Barium  . Hyperlipidemia   . Hypertension   . Jaundice   . Low back pain   . MRSA (methicillin resistant Staphylococcus aureus)   . Neuropathy   . Nocturia   . OA (osteoarthritis)   . Osteoporosis   . Ovarian cyst   . PAD (peripheral artery disease) ([place])   . Peripheral vascular disease ([place])   . Pleural effusion, left 04/17/2017   Small  . RA (rheumatoid arthritis) ([place])   . Spinal stenosis   . Toe ulcer, right, with necrosis of bone ([place]) 08/25/2018  . Type 2 diabetes mellitus ([place])   . Unsteady gait   . Urinary incontinence   . Uterine polyp   . Varicose vein of leg    Bilateral    Family History   family history includes Cancer in her mother.  Prior Rehab/Hospitalizations Has the patient had prior rehab or hospitalizations prior to admission? No  Has the patient had major surgery during 100 days prior to admission? No   Current Medications  Current Facility-Administered Medications:  .  0.9 %  sodium chloride infusion, , Intravenous, Continuous, Danford, Suann Larry, MD .  acetaminophen (TYLENOL) tablet 650 mg, 650 mg, Oral, Q4H PRN **OR** acetaminophen (TYLENOL) solution 650 mg, 650 mg, Per Tube, Q4H PRN **OR** acetaminophen (TYLENOL) suppository 650 mg, 650 mg, Rectal, Q4H PRN, Posey Pronto, Vishal R, MD .  amLODipine (NORVASC) tablet 5 mg, 5 mg, Oral, Daily, Danford, Suann Larry, MD, 5 mg at 06/28/19 1227 .  apixaban (ELIQUIS) tablet 2.5 mg, 2.5 mg, Oral, BID, Eugenie Filler, MD, 2.5 mg at 06/28/19 1225 .  aspirin suppository 300 mg, 300 mg, Rectal, Daily **OR** aspirin tablet 325 mg, 325 mg, Oral, Daily, Zada Finders R, MD, 325 mg at 06/28/19 1225 .  atorvastatin (LIPITOR) tablet 40 mg, 40 mg, Oral, q1800, Lenore Cordia, MD, 40 mg at 06/27/19 2131 .  hydrALAZINE (APRESOLINE) injection 5 mg, 5 mg, Intravenous, Q6H PRN, Danford, Christopher P, MD .  insulin aspart (novoLOG)  injection 0-9 Units, 0-9 Units, Subcutaneous, TID WC, Lenore Cordia, MD, 3 Units at 06/28/19 1227 .  metoprolol succinate (TOPROL-XL) 24 hr tablet 12.5 mg, 12.5 mg, Oral, Daily, Eugenie Filler, MD, 12.5 mg at 06/28/19 1225 .  senna-docusate (Senokot-S) tablet  1 tablet, 1 tablet, Oral, QHS PRN, Lenore Cordia, MD  Patients Current Diet:  Diet Order            Diet heart healthy/carb modified Room service appropriate? Yes; Fluid consistency: Thin  Diet effective now              Precautions / Restrictions Precautions Precautions: Fall   Has the patient had 2 or more falls or a fall with injury in the past year? 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 Mod I  Indoor Mobility: Did the patient need assistance with walking from room to room (with or without device)? Mod I   Stairs: Did the patient need assistance with internal or external stairs (with or without device)? Mod I  Functional Cognition: Did the patient need help planning regular tasks such as shopping or remembering to take medications? independent  [place] / Equipment Home Equipment: Walker - 2 wheels, Tub bench, Cane - single point  Prior Device Use: Indicate devices/aids used by the patient prior to current illness, exacerbation or injury? Walker  Current Functional Level Cognition  Arousal/Alertness: Awake/alert Overall Cognitive Status: Impaired/Different from baseline Current Attention Level: Sustained Orientation Level: Disoriented X4 Following Commands: Follows one step commands consistently, Follows one step commands inconsistently General Comments: Pt initially oriented x4, however son present at end of session and asking some of the same orientation questions and pt was not able to correctly  respond. Noted slow processing and perseveration on straps of posey belt that was donned when PT entered.     Extremity Assessment (includes Sensation/Coordination)  Upper Extremity Assessment: LUE deficits/detail, RUE deficits/detail RUE Deficits / Details: Decreased AROM from injury years ago.  LUE Deficits / Details: Mild weakness and difficulty gripping walker at times. LUE Coordination: decreased gross motor, decreased fine motor  Lower Extremity Assessment: RLE deficits/detail, LLE deficits/detail RLE Deficits / Details: (P) Generalized weakness - grossly 4-/5 bilaterally in quads and hams but difficult to MMT because pt was getting confused with verbal directions. LLE Deficits / Details: (P) Difficult to assess strength 2 cognition but coordination appeared to be slightly impaired compared to RLE. Questionable proprioception deficits in bilateral feet as pt not planting heel during gait training. Towards end of gait training noted pt began elevating R heel as well.     ADLs  Overall ADL's : Needs assistance/impaired Eating/Feeding: Set up, Bed level Grooming: Wash/dry hands, Min guard, Standing Upper Body Bathing: Minimal assistance, Sitting Lower Body Bathing: Moderate assistance, Sit to/from stand Upper Body Dressing : Minimal assistance, Sitting Lower Body Dressing: Moderate assistance, Sit to/from stand Lower Body Dressing Details (indicate cue type and reason): frequently drops socks  Toilet Transfer: Minimal assistance, RW Toilet Transfer Details (indicate cue type and reason): mod verbal cues for walker safety  Toileting- Clothing Manipulation and Hygiene: Minimal assistance, Sit to/from stand Functional mobility during ADLs: Minimal assistance, Rolling walker    Mobility  Overal bed mobility: Needs Assistance Bed Mobility: Supine to Sit, Sit to Supine Supine to sit: (P) Min guard, Min assist Sit to supine: Min assist General bed mobility comments: (P) Pt was able to  transition to EOB with increased time and min guard for trunk elevation to full sitting position. Noted posterior lean once sitting EOB and min assist provided for support  to correct.     Transfers  Overall transfer level: Needs assistance Equipment used: (P) Rolling walker (2 wheeled) Transfers: Sit to/from Stand, Stand Pivot Transfers Sit to Stand: Min assist Stand pivot transfers: Min assist General transfer comment: (P) Pt with poor walker safety.  She tends to push walker very far in front of her, and leans anteriorly. Assist required to control descent to [facility].     Ambulation / Gait / Stairs / Wheelchair Mobility  Ambulation/Gait Ambulation/Gait assistance: (P) Min assist, +2 safety/equipment Gait Distance (Feet): (P) 75 Feet Assistive device: (P) Rolling walker (2 wheeled) Gait Pattern/deviations: (P) Step-through pattern, Decreased stride length, Drifts right/left General Gait Details: (P) Noted pt with uncoordinated advancement of LE's initially with decreased heel strike progressing to toe walking - L side worse than R side. Pt with L side inattention and had difficulty avoiding obstacles on the L side in the hall. Even with cues to go around an obstacle in the hall she still hit it with her walker. Chair follow utilized and pt was given a ride back to her room due to fatigue after ambulating 75'. Gait velocity: (P) Decreased Gait velocity interpretation: (P) <1.8 ft/sec, indicate of risk for recurrent falls    Posture / Balance Balance Overall balance assessment: Needs assistance Sitting-balance support: Feet supported Sitting balance-Leahy Scale: Fair Standing balance support: Single extremity supported Standing balance-Leahy Scale: Poor Standing balance comment: requires UE support     Special needs/care consideration BiPAP/CPAP no CPM no Continuous Drip IV no Dialysis no        Days n/a Life Vest no Oxygen no Special Bed no Trach Size no Wound Vac (area) no       Location n/a Skin intact                           Bowel mgmt: last BM prior to admission, continent Bladder mgmt: incontinent, purewick Diabetic mgmt: yes Behavioral consideration no Chemo/radiation no   Previous Home Environment (from acute therapy documentation) Living Arrangements: Spouse/significant other  Lives With: Spouse Available Help at Discharge: Family Type of Home: House Home Layout: One level Home Access: Ramped entrance Bathroom Shower/Tub: Chiropodist: Standard Additional Comments: Pt indicates spouse can assist her at discharge   Discharge Living Setting Plans for Discharge Living Setting: Patient's home Type of Home at Discharge: House Discharge Home Layout: One level Discharge Home Access: Ramped entrance Discharge Bathroom Shower/Tub: Tub/shower unit Discharge Bathroom Toilet: Standard Discharge Bathroom Accessibility: Yes How Accessible: Accessible via walker Does the patient have any problems obtaining your medications?: No  Social/Family/Support Systems Patient Roles: Spouse, Parent Anticipated Caregiver: pts spouse, and adult children Anticipated Caregiver's Contact Information: Mosetta Anis [phone], husband Nori Riis [phone] Ability/Limitations of Caregiver: [name] states family is agreeable to 24/7 supervision if needed Caregiver Availability: 24/7 Discharge Plan Discussed with Primary Caregiver: Yes Is Caregiver In Agreement with Plan?: Yes Does Caregiver/Family have Issues with Lodging/Transportation while Pt is in Rehab?: No  Goals/Additional Needs Patient/Family Goal for Rehab: PT/OT/SLP supervision Expected length of stay: 12-14 days Dietary Needs: carb modified/thin Equipment Needs: tbd Additional Information: reviewed visitor policy with [name] Pt/Family Agrees to Admission and willing to participate: Yes Program Orientation Provided & Reviewed with Pt/Caregiver Including Roles  & Responsibilities: Yes  Decrease  burden of Care through IP rehab admission: n/a  Possible need for SNF placement upon discharge: No.  Family aware of plan to d/c home with family supervision and agreeable to  providing 24/7.   Patient Condition: I have reviewed medical records from [facility], spoken with CM, and patient and son. I met with patient at the bedside for inpatient rehabilitation assessment.  Patient will benefit from ongoing PT, OT and SLP, can actively participate in 3 hours of therapy a day 5 days of the week, and can make measurable gains during the admission.  Patient will also benefit from the coordinated team approach during an Inpatient Acute Rehabilitation admission.  The patient will receive intensive therapy as well as Rehabilitation physician, nursing, social worker, and care management interventions.  Due to safety, disease management, medication administration, pain management and patient education the patient requires 24 hour a day rehabilitation nursing.  The patient is currently min to mod with mobility and basic ADLs.  Discharge setting and therapy post discharge at home with home health is anticipated.  Patient has agreed to participate in the Acute Inpatient Rehabilitation Program and will admit today.  Preadmission Screen Completed By:  Michel Santee, PT, DPT 06/28/2019 12:54 PM ______________________________________________________________________   Discussed status with Dr. Naaman Plummer on 06/28/19  at 1:02 PM  and received approval for admission today.  Admission Coordinator:  Michel Santee, PT, DPT time 1:02 PM Sudie Grumbling 06/28/19    Assessment/Plan: Diagnosis: right temporal infarct 1. Does the need for close, 24 hr/day Medical supervision in concert with the patient's rehab needs make it unreasonable for this patient to be served in a less intensive setting? Yes 2. Co-Morbidities requiring supervision/potential complications: PAF, PAD 3. Due to bladder management, bowel management, safety,  skin/wound care, disease management, medication administration, pain management and patient education, does the patient require 24 hr/day rehab nursing? Yes 4. Does the patient require coordinated care of a physician, rehab nurse, PT (1-2 hrs/day, 5 days/week), OT (1-2 hrs/day, 5 days/week) and SLP (1-2 hrs/day, 5 days/week) to address physical and functional deficits in the context of the above medical diagnosis(es)? Yes Addressing deficits in the following areas: balance, endurance, locomotion, strength, transferring, bowel/bladder control, bathing, dressing, feeding, grooming, toileting, cognition and psychosocial support 5. Can the patient actively participate in an intensive therapy program of at least 3 hrs of therapy 5 days a week? Yes 6. The potential for patient to make measurable gains while on inpatient rehab is excellent 7. Anticipated functional outcomes upon discharge from inpatients are: supervision PT, supervision OT, supervision SLP 8. Estimated rehab length of stay to reach the above functional goals is: 11-14 days 9. Anticipated D/C setting: Home 10. Anticipated post D/C treatments: [place] therapy 11. Overall Rehab/Functional Prognosis: excellent  MD Signature: Meredith Staggers, MD, [place] 06/28/2019

## 2019-06-28 NOTE — H&P (Signed)
Physical Medicine and Rehabilitation Admission H&P        Chief Complaint  Patient presents with   Transient Ischemic Attack  : HPI: [name] is an 85 year old right-handed female with history of PAF maintained on Coumadin, peripheral vascular disease status post left SFA angioplasty and stenting, type 2 diabetes mellitus, hypertension, hyperlipidemia.  Per chart review patient lives with spouse.  1 level home with ramped entrance.  Ambulates with a rolling walker.  She was performing her own ADLs modified independent.  Presented 06/26/2019 with left-sided weakness and slurred speech that has been transient over 2 days.  No reports of chest pain or shortness of breath.  Blood pressure in the ED 143/95-213/112.  INR on admission of 2.00, COVID negative.  Cranial CT scan negative for acute changes.  Patient did not receive TPA.  MRI of the brain showed small foci of acute early subacute ischemia within the posterior right temporal lobe within the PCA territory.  MR angiogram showed short segment occlusion of both posterior cerebral arteries, the P2-3 junction on the right and proximal P2 segment on the left.  Short segment occlusion of the right MCA M1 segment just proximal to the bifurcation with normal M2 branches.  Carotid Dopplers with no significant ICA stenosis.  Echocardiogram with ejection fraction of 71% normal systolic function.  Neurology consulted Coumadin transition to Eliquis as well as remaining on aspirin therapy.  Tolerating a regular consistency diet.  Appetite has been limited started on gentle IV fluids.  Therapy evaluations completed and patient was admitted for a comprehensive rehab program   Review of Systems  Constitutional: Negative for chills and fever.  HENT: Negative for hearing loss.   Eyes: Negative for blurred vision and double vision.  Respiratory: Negative for cough.        SOB with exertion  Cardiovascular: Positive for palpitations and leg swelling.  Negative for chest pain.  Gastrointestinal: Positive for constipation. Negative for heartburn, nausea and vomiting.       GERD  Genitourinary: Negative for dysuria, flank pain and hematuria.       Positive nocturia  Musculoskeletal: Positive for back pain, joint pain and myalgias.  Skin: Negative for rash.  Neurological: Positive for speech change and focal weakness.  All other systems reviewed and are negative.       Past Medical History:  Diagnosis Date   Acute osteomyelitis ([place]) 08/25/2018    Right toe   Anemia     Atrial fibrillation (HCC)     Carpal tunnel syndrome, bilateral     Cellulitis 08/25/2018    Right foot   Cervical lymphadenopathy     Compression fracture of fifth lumbar vertebra (HCC)      to T 11   DDD (degenerative disc disease), lumbar     DJD (degenerative joint disease)     DOE (dyspnea on exertion)     Dysphagia     Dyspnea      with exertion   Dysrhythmia      hx brief AF post op hip surg   Esophageal dysmotility 01/31/2014    noted barium   GERD (gastroesophageal reflux disease)     Hematoma      right side after stent placement   History of hiatal hernia 01/31/2014    Small, noted on Barium   Hyperlipidemia     Hypertension     Jaundice     Low back pain     MRSA (methicillin resistant  Staphylococcus aureus)     Neuropathy     Nocturia     OA (osteoarthritis)     Osteoporosis     Ovarian cyst     PAD (peripheral artery disease) (HCC)     Peripheral vascular disease (HCC)     Pleural effusion, left 04/17/2017    Small   RA (rheumatoid arthritis) (HCC)     Spinal stenosis     Toe ulcer, right, with necrosis of bone ([place]) 08/25/2018   Type 2 diabetes mellitus (HCC)     Unsteady gait     Urinary incontinence     Uterine polyp     Varicose vein of leg      Bilateral        Past Surgical History:  Procedure Laterality Date   AMPUTATION Right 02/04/2013    Procedure: [name] ;   Surgeon: Wylene Simmer, MD;  Location: [facility];  Service: Orthopedics;  Laterality: Right;   AMPUTATION TOE Right 09/03/2018    Procedure: AMPUTATION 3RD RIGHT TOE INTERPHALANGEAL;  Surgeon: [name], DPM;  Location: [facility];  Service: Podiatry;  Laterality: Right;   APPENDECTOMY       COLONOSCOPY       EYE SURGERY        both cataracts   FEMORAL ARTERY EXPLORATION Right 04/12/2017    Procedure: EVACUATION HEMATOMA RIGHT GROIN WITH FEMORAL ARTERY REPAIR;  Surgeon: Rosetta Posner, MD;  Location: [facility];  Service: Vascular;  Laterality: Right;   INCISION AND DRAINAGE   2011    left hip inf-hemovac   IR ANGIOGRAM EXTREMITY LEFT   03/25/2017   IR ANGIOGRAM EXTREMITY LEFT   04/08/2017   IR ANGIOGRAM SELECTIVE EACH ADDITIONAL VESSEL   03/25/2017   IR FEM POP ART STENT INC PTA MOD SED   03/25/2017   IR INFUSION THROMBOL ARTERIAL INITIAL (MS)   04/08/2017   IR RADIOLOGIST EVAL & MGMT   02/26/2017   IR RADIOLOGIST EVAL & MGMT   04/03/2017   IR RADIOLOGIST EVAL & MGMT   08/20/2017   IR RADIOLOGIST EVAL & MGMT   01/22/2018   IR RADIOLOGIST EVAL & MGMT   01/13/2019   IR THROMB F/U EVAL ART/VEN FINAL DAY (MS)   04/09/2017   IR TIB-PERO ART PTA MOD SED   03/25/2017   IR US GUIDE VASC ACCESS RIGHT   03/25/2017   IR US GUIDE VASC ACCESS RIGHT   04/08/2017   KNEE ARTHROSCOPY   1995    left   NECK MASS EXCISION       TONSILLECTOMY       TOTAL HIP ARTHROPLASTY   2006    left-fx   TOTAL KNEE ARTHROPLASTY   2010,2012    left   TOTAL KNEE ARTHROPLASTY   2009    right        Family History  Problem Relation Age of Onset   Cancer Mother     Social History:  reports that she has never smoked. She has never used smokeless tobacco. She reports previous alcohol use. She reports that she does not use drugs. Allergies:       Allergies  Allergen Reactions   Amprenavir Other (See Comments)      Unknown reaction   Bactrim [Sulfamethoxazole-Trimethoprim] Nausea Only    Ceftriaxone Sodium Other (See Comments)      Tingly feeling after given, then one hour later while vanco infusing pruritic rash   Phenergan [Promethazine Hcl]  Other (See Comments)      Hallucinations   Sulfonamide Derivatives Hives   Vancomycin Hcl Rash      Rash happened 1hr after rocephin but immediately after infusing vancomycin         Medications Prior to Admission  Medication Sig Dispense Refill   acetaminophen (TYLENOL) 325 MG tablet Take 650 mg by mouth daily with breakfast.        aspirin EC 325 MG tablet Take 325 mg by mouth daily with supper.        calcium carbonate (OSCAL) 1500 (600 Ca) MG TABS tablet Take 600 mg of elemental calcium by mouth daily with breakfast.        ergocalciferol (VITAMIN D2) 1.25 MG (50000 UT) capsule Take 50,000 Units by mouth every Sunday.        ferrous sulfate 325 (65 FE) MG tablet Take 325 mg by mouth daily with breakfast.       metFORMIN (GLUCOPHAGE-XR) 500 MG 24 hr tablet Take 500 mg by mouth daily with supper.       metoprolol succinate (TOPROL-XL) 25 MG 24 hr tablet Take 25 mg by mouth daily with supper.        vitamin B-12 (CYANOCOBALAMIN) 1000 MCG tablet Take 1,000 mcg by mouth daily with breakfast.        warfarin (COUMADIN) 2 MG tablet Take 2 mg by mouth daily with supper. Take 0.5 tablet (1 mg) by mouth on Thursday evenings & take 1 tablet (2 mg) by mouth on all other nights.          Drug Regimen Review Drug regimen was reviewed and remains appropriate with no significant issues identified   Home: Home Living Family/patient expects to be discharged to:: Private residence Living Arrangements: Spouse/significant other Available Help at Discharge: Family Type of Home: House Home Access: Ramped entrance [place]: One level Bathroom Shower/Tub: Chiropodist: [place]: Environmental consultant - 2 wheels, Tub bench, Cane - single point Additional Comments: Pt indicates spouse can assist her at discharge     Lives With: Spouse   Functional History: Prior Function Level of Independence: Needs assistance Gait / Transfers Assistance Needed: Pt reports she was ambulating with RW  ADL's / Homemaking Assistance Needed: Pt reports she was performing ADLs mod I, and that she was driving and doing light house cleaning, including mopping floors.  No family present to clarify info    Functional Status:  Mobility: Bed Mobility Overal bed mobility: Needs Assistance Bed Mobility: Supine to Sit, Sit to Supine Supine to sit: Min assist Sit to supine: Min assist General bed mobility comments: requires assist to lift trunk and assist to lift LEs onto bed  Transfers Overall transfer level: Needs assistance Transfers: Sit to/from Stand, Stand Pivot Transfers Sit to Stand: Min assist Stand pivot transfers: Min assist General transfer comment: Pt with poor walker safety.  She tends to push walker very far in front of her, and leans anteriorly       ADL: ADL Overall ADL's : Needs assistance/impaired Eating/Feeding: Set up, Bed level Grooming: Wash/dry hands, Min guard, Standing Upper Body Bathing: Minimal assistance, Sitting Lower Body Bathing: Moderate assistance, Sit to/from stand Upper Body Dressing : Minimal assistance, Sitting Lower Body Dressing: Moderate assistance, Sit to/from stand Lower Body Dressing Details (indicate cue type and reason): frequently drops socks  Toilet Transfer: Minimal assistance, RW Toilet Transfer Details (indicate cue type and reason): mod verbal cues for walker safety  Toileting- Clothing Manipulation and Hygiene:  Minimal assistance, Sit to/from stand Functional mobility during ADLs: Minimal assistance, Rolling walker   Cognition: Cognition Overall Cognitive Status: Impaired/Different from baseline Arousal/Alertness: Awake/alert Orientation Level: Disoriented X4 Cognition Arousal/Alertness: Awake/alert, Lethargic Behavior During Therapy: WFL for tasks  assessed/performed Overall Cognitive Status: Impaired/Different from baseline Area of Impairment: Attention, Following commands, Problem solving, Awareness Current Attention Level: Sustained Following Commands: Follows one step commands consistently, Follows one step commands inconsistently Awareness: Intellectual Problem Solving: Slow processing, Difficulty sequencing, Requires verbal cues, Requires tactile cues General Comments: Pt asked for assist opening towlette.  It was handed back to her unfolded, and she proceeded to attempt to further unfold it.   When it was explained that it was fully unfolded, she continued attempts to unfoled it.   She was heavily distracted by food droppings on the bed, reaching fully across body and forward (while standing) in attempts to cleaning them up.  She is slow to respond, and required mod cues for RW safety.  The Short Blessed Test was administered with pt scoring 2/28- 1 error with sequencing    Physical Exam: Blood pressure (!) 168/106, pulse 74, temperature 97.7 F (36.5 C), temperature source Axillary, resp. rate 17, height 5\' 3"  (1.6 m), weight 50 kg, SpO2 99 %. Physical Exam  Constitutional: She appears well-developed. No distress.  HENT:  Head: Normocephalic and atraumatic.  Eyes: Pupils are equal, round, and reactive to light. Left eye exhibits no discharge.  Neck: Normal range of motion. No thyromegaly present.  Cardiovascular: Normal rate. Exam reveals no friction rub.  No murmur heard. IRR IRR  Respiratory: Effort normal. No respiratory distress. She has no wheezes.  GI: Soft. She exhibits no distension. There is abdominal tenderness.  Musculoskeletal:        General: No deformity or edema.  Neurological:  Patient is sitting up in chair with son at bedside.  She is a bit sleepy but easily arousable.  Follows simple commands.  Oriented to person, place, reason she's here with extra time. LUE 3+ to 4-/5, LLE 3+ to 4-/5 prox to 4/5 distally.  RUE and RLE 4/5. Senses pain and light touch in all 4's. No focal CN deficits. Delayed procesing, decreased insight and awareness. .  Skin:  Missing 3rd and 5th toes right foot and 3rd toe left foot. bpg scars LLE  Psychiatric:  Flat, but generally pleasant and cooperative      Lab Results Last 48 Hours        Results for orders placed or performed during the hospital encounter of 06/26/19 (from the past 48 hour(s))  Protime-INR     Status: Abnormal    Collection Time: 06/26/19  7:58 PM  Result Value Ref Range    Prothrombin Time 22.4 (H) 11.4 - 15.2 seconds    INR 2.0 (H) 0.8 - 1.2      Comment: (NOTE) INR goal varies based on device and disease states. Performed at [facility], [place] [address] [place], [place] [address]    APTT     Status: Abnormal    Collection Time: 06/26/19  7:58 PM  Result Value Ref Range    aPTT 41 (H) 24 - 36 seconds      Comment:        IF BASELINE aPTT IS ELEVATED, SUGGEST PATIENT RISK ASSESSMENT BE USED TO DETERMINE APPROPRIATE ANTICOAGULANT THERAPY. Performed at [facility], [place] [address] [place], [place] [address]    CBC     Status: Abnormal  Collection Time: 06/26/19  7:58 PM  Result Value Ref Range    WBC 8.1 4.0 - 10.5 K/uL    RBC 5.48 (H) 3.87 - 5.11 MIL/uL    Hemoglobin 17.6 (H) 12.0 - 15.0 g/dL    HCT 53.2 (H) 36.0 - 46.0 %    MCV 97.1 80.0 - 100.0 fL    MCH 32.1 26.0 - 34.0 pg    MCHC 33.1 30.0 - 36.0 g/dL    RDW 12.6 11.5 - 15.5 %    Platelets 197 150 - 400 K/uL    nRBC 0.0 0.0 - 0.2 %      Comment: Performed at [facility], [place] [address], [place], [place] [address]  Differential     Status: None    Collection Time: 06/26/19  7:58 PM  Result Value Ref Range    Neutrophils Relative % 71 %    Neutro Abs 5.8 1.7 - 7.7 K/uL    Lymphocytes Relative 17 %    Lymphs Abs 1.4 0.7 - 4.0 K/uL    Monocytes Relative 9 %    Monocytes Absolute 0.7 0.1 - 1.0 K/uL    Eosinophils Relative 2 %    Eosinophils Absolute  0.1 0.0 - 0.5 K/uL    Basophils Relative 1 %    Basophils Absolute 0.0 0.0 - 0.1 K/uL    Immature Granulocytes 0 %    Abs Immature Granulocytes 0.02 0.00 - 0.07 K/uL      Comment: Performed at [facility], [place] [address] [place], [place] [address]  Comprehensive metabolic panel     Status: Abnormal    Collection Time: 06/26/19  7:58 PM  Result Value Ref Range    Sodium 140 135 - 145 mmol/L    Potassium 4.5 3.5 - 5.1 mmol/L    Chloride 104 98 - 111 mmol/L    CO2 26 22 - 32 mmol/L    Glucose, Bld 148 (H) 70 - 99 mg/dL    BUN 21 8 - 23 mg/dL    Creatinine, Ser 0.77 0.44 - 1.00 mg/dL    Calcium 9.9 8.9 - 10.3 mg/dL    Total Protein 7.4 6.5 - 8.1 g/dL    Albumin 4.1 3.5 - 5.0 g/dL    AST 21 15 - 41 U/L    ALT 20 0 - 44 U/L    Alkaline Phosphatase 66 38 - 126 U/L    Total Bilirubin 0.9 0.3 - 1.2 mg/dL    GFR calc non Af Amer >60 >60 mL/min    GFR calc Af Amer >60 >60 mL/min    Anion gap 10 5 - 15      Comment: Performed at [facility], [place] [address] [place],  [address]  SARS Coronavirus 2 [facility] order, Performed in [facility] hospital lab) Nasopharyngeal Nasopharyngeal Swab     Status: None    Collection Time: 06/26/19  9:40 PM    Specimen: Nasopharyngeal Swab  Result Value Ref Range    SARS Coronavirus 2 NEGATIVE NEGATIVE      Comment: (NOTE) If result is NEGATIVE SARS-CoV-2 target nucleic acids are NOT DETECTED. The SARS-CoV-2 RNA is generally detectable in upper and lower  respiratory specimens during the acute phase of infection. The lowest  concentration of SARS-CoV-2 viral copies this assay can detect is 250  copies / mL. A negative result does not preclude SARS-CoV-2 infection  and should not be used as the sole basis for treatment or other  patient management decisions.  A negative result may occur with  improper specimen collection / handling, submission of specimen other  than nasopharyngeal swab, presence of viral mutation(s) within the    areas targeted by this assay, and inadequate number of viral copies  (<250 copies / mL). A negative result must be combined with clinical  observations, patient history, and epidemiological information. If result is POSITIVE SARS-CoV-2 target nucleic acids are DETECTED. The SARS-CoV-2 RNA is generally detectable in upper and lower  respiratory specimens dur ing the acute phase of infection.  Positive  results are indicative of active infection with SARS-CoV-2.  Clinical  correlation with patient history and other diagnostic information is  necessary to determine patient infection status.  Positive results do  not rule out bacterial infection or co-infection with other viruses. If result is PRESUMPTIVE POSTIVE SARS-CoV-2 nucleic acids MAY BE PRESENT.   A presumptive positive result was obtained on the submitted specimen  and confirmed on repeat testing.  While 2019 novel coronavirus  (SARS-CoV-2) nucleic acids may be present in the submitted sample  additional confirmatory testing may be necessary for epidemiological  and / or clinical management purposes  to differentiate between  SARS-CoV-2 and other Sarbecovirus currently known to infect humans.  If clinically indicated additional testing with an alternate test  methodology [phone]) is advised. The SARS-CoV-2 RNA is generally  detectable in upper and lower respiratory sp ecimens during the acute  phase of infection. The expected result is Negative. Fact Sheet for Patients:  StrictlyIdeas.no Fact Sheet for Healthcare Providers: BankingDealers.co.za This test is not yet approved or cleared by the [place] FDA and has been authorized for detection and/or diagnosis of SARS-CoV-2 by FDA under an Emergency Use Authorization (EUA).  This EUA will remain in effect (meaning this test can be used) for the duration of the COVID-19 declaration under Section 564(b)(1) of the Act, 21  U.S.C. section 360bbb-3(b)(1), unless the authorization is terminated or revoked sooner. Performed at [facility], [place] [address] [place], [place] [address]    Hemoglobin A1c     Status: Abnormal    Collection Time: 06/27/19  4:11 AM  Result Value Ref Range    Hgb A1c MFr Bld 7.4 (H) 4.8 - 5.6 %      Comment: (NOTE) Pre diabetes:          5.7%-6.4% Diabetes:              >6.4% Glycemic control for   <7.0% adults with diabetes      Mean Plasma Glucose 165.68 mg/dL      Comment: Performed at [place] [facility] [address], [place], [place] [address]  Lipid panel     Status: Abnormal    Collection Time: 06/27/19  4:11 AM  Result Value Ref Range    Cholesterol 187 0 - 200 mg/dL    Triglycerides 170 (H) <150 mg/dL    HDL 27 (L) >40 mg/dL    Total CHOL/HDL Ratio 6.9 RATIO    VLDL 34 0 - 40 mg/dL    LDL Cholesterol 126 (H) 0 - 99 mg/dL      Comment:        Total Cholesterol/HDL:CHD Risk Coronary Heart Disease Risk Table                     Men   Women  1/2 Average Risk   3.4   3.3  Average Risk  5.0   4.4  2 X Average Risk   9.6   7.1  3 X Average Risk  23.4   11.0        Use the calculated Patient Ratio above and the CHD Risk Table to determine the patient's CHD Risk.        ATP III CLASSIFICATION (LDL):  <100     mg/dL   Optimal  100-129  mg/dL   Near or Above                    Optimal  130-159  mg/dL   Borderline  160-189  mg/dL   High  >190     mg/dL   Very High Performed at [place] [address], [place], [place] [address]    Protime-INR     Status: Abnormal    Collection Time: 06/27/19  4:11 AM  Result Value Ref Range    Prothrombin Time 21.3 (H) 11.4 - 15.2 seconds    INR 1.9 (H) 0.8 - 1.2      Comment: (NOTE) INR goal varies based on device and disease states. Performed at [facility], [place] [address] [place], [place] [address]    Glucose, capillary     Status: Abnormal    Collection Time: 06/27/19  3:47 PM  Result  Value Ref Range    Glucose-Capillary 158 (H) 70 - 99 mg/dL    Comment 1 Notify RN      Comment 2 Document in Chart    Glucose, capillary     Status: Abnormal    Collection Time: 06/27/19  9:28 PM  Result Value Ref Range    Glucose-Capillary 124 (H) 70 - 99 mg/dL  Glucose, capillary     Status: Abnormal    Collection Time: 06/28/19  6:19 AM  Result Value Ref Range    Glucose-Capillary 213 (H) 70 - 99 mg/dL  CBC     Status: Abnormal    Collection Time: 06/28/19  6:36 AM  Result Value Ref Range    WBC 14.1 (H) 4.0 - 10.5 K/uL    RBC 5.63 (H) 3.87 - 5.11 MIL/uL    Hemoglobin 18.3 (H) 12.0 - 15.0 g/dL    HCT 53.3 (H) 36.0 - 46.0 %    MCV 94.7 80.0 - 100.0 fL    MCH 32.5 26.0 - 34.0 pg    MCHC 34.3 30.0 - 36.0 g/dL    RDW 12.5 11.5 - 15.5 %    Platelets 225 150 - 400 K/uL    nRBC 0.0 0.0 - 0.2 %      Comment: Performed at [facility], [place] [address], [place], [place] [address]  Basic metabolic panel     Status: Abnormal    Collection Time: 06/28/19  6:36 AM  Result Value Ref Range    Sodium 140 135 - 145 mmol/L    Potassium 4.0 3.5 - 5.1 mmol/L    Chloride 105 98 - 111 mmol/L    CO2 17 (L) 22 - 32 mmol/L    Glucose, Bld 211 (H) 70 - 99 mg/dL    BUN 31 (H) 8 - 23 mg/dL    Creatinine, Ser 1.13 (H) 0.44 - 1.00 mg/dL    Calcium 9.1 8.9 - 10.3 mg/dL    GFR calc non Af Amer 43 (L) >60 mL/min    GFR calc Af Amer 50 (L) >60 mL/min    Anion gap 18 (H) 5 -  15      Comment: Performed at [facility], [place] [address], [place], [place] [address]      Imaging Results (Last 48 hours)  Ct Head Wo Contrast   Result Date: 06/26/2019 CLINICAL DATA:  Tingling in left hand. EXAM: CT HEAD WITHOUT CONTRAST TECHNIQUE: Contiguous axial images were obtained from the base of the skull through the vertex without intravenous contrast. COMPARISON:  October 13, 2007 FINDINGS: Brain: No subdural, epidural, or subarachnoid hemorrhage. Ventricles and sulci are unremarkable. Cerebellum, brainstem,  and basal cisterns are normal. Mild white matter changes identified. No acute cortical ischemia or infarct is noted. No mass effect or midline shift is identified. Vascular: Calcified atherosclerosis is seen in the intracranial carotids. Skull: Normal. Negative for fracture or focal lesion. Sinuses/Orbits: Opacification of the right frontal sinus is identified. Paranasal sinuses, mastoid air cells, and middle ears are otherwise normal. Other: None. IMPRESSION: 1. No acute intracranial abnormalities are identified. Electronically Signed   By: Dorise Bullion III M.D   On: 06/26/2019 19:55    Mr Angio Head Wo Contrast   Result Date: 06/27/2019 CLINICAL DATA:  Left-sided facial droop and slurred speech with left hand weakness. Three episodes in last 2 days. EXAM: MRI HEAD WITHOUT CONTRAST MRA HEAD WITHOUT CONTRAST TECHNIQUE: Multiplanar, multiecho pulse sequences of the brain and surrounding structures were obtained without intravenous contrast. Angiographic images of the head were obtained using MRA technique without contrast. COMPARISON:  Head CT 06/26/2019 Brain MRI 09/24/2009 FINDINGS: MRI HEAD FINDINGS BRAIN: There are 2-3 small areas of abnormal diffusion restriction within the posterior right temporal lobe (series 5, image 63). The white matter signal is normal for the patient's age. The cerebral and cerebellar volume are age-appropriate. There is no hydrocephalus. The midline structures are normal. VASCULAR: There are 2 scattered foci of chronic microhemorrhage in the supratentorial brain. The major intracranial arterial and venous sinus flow voids are normal. SKULL AND UPPER CERVICAL SPINE: Calvarial bone marrow signal is normal. There is no skull base mass. The visualized upper cervical spine and soft tissues are normal. SINUSES/ORBITS: There are no fluid levels or advanced mucosal thickening. The mastoid air cells and middle ear cavities are free of fluid. The orbits are normal. MRA HEAD FINDINGS  POSTERIOR CIRCULATION: --Vertebral arteries: Normal V4 segments. --Posterior inferior cerebellar arteries (PICA): The left PICA and AICA share a common origin. Normal origin of right PICA from the ipsilateral vertebral artery. --Anterior inferior cerebellar arteries (AICA): Patent origins from the basilar artery. --Basilar artery: Normal. --Superior cerebellar arteries: Normal. --Posterior cerebral arteries (PCA): The right PCA is occluded at the P2-3 junction with distal reconstitution. The left PCA is occluded as short segment of the proximal P2 segment. The distal left PCA is patent. ANTERIOR CIRCULATION: --Intracranial internal carotid arteries: Normal. --Anterior cerebral arteries (ACA): Multifocal stenosis of the left anterior cerebral artery with areas of intermittent loss of normal flow related enhancement. --Middle cerebral arteries (MCA): Short segment loss of enhancement of the right middle cerebral artery distal M1 segment. The M2 branches are normal. The left M1 segment is normal. There is moderate stenosis of the left M2 inferior division. IMPRESSION: 1. 2-3 small foci of acute to early subacute ischemia within the posterior right temporal lobe, within the PCA territory. 2. 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 3. Short segment occlusion of the right MCA M1 segment, just proximal to the bifurcation, with normal M2  branches. 4. Moderate stenosis of the left M2 inferior division. Electronically Signed   By: Ulyses Jarred M.D.   On: 06/27/2019 00:42    Mr Brain Wo Contrast   Result Date: 06/27/2019 CLINICAL DATA:  Left-sided facial droop and slurred speech with left hand weakness. Three episodes in last 2 days. EXAM: MRI HEAD WITHOUT CONTRAST MRA HEAD WITHOUT CONTRAST TECHNIQUE: Multiplanar, multiecho pulse sequences of the brain and surrounding structures were obtained without intravenous contrast. Angiographic  images of the head were obtained using MRA technique without contrast. COMPARISON:  Head CT 06/26/2019 Brain MRI 09/24/2009 FINDINGS: MRI HEAD FINDINGS BRAIN: There are 2-3 small areas of abnormal diffusion restriction within the posterior right temporal lobe (series 5, image 63). The white matter signal is normal for the patient's age. The cerebral and cerebellar volume are age-appropriate. There is no hydrocephalus. The midline structures are normal. VASCULAR: There are 2 scattered foci of chronic microhemorrhage in the supratentorial brain. The major intracranial arterial and venous sinus flow voids are normal. SKULL AND UPPER CERVICAL SPINE: Calvarial bone marrow signal is normal. There is no skull base mass. The visualized upper cervical spine and soft tissues are normal. SINUSES/ORBITS: There are no fluid levels or advanced mucosal thickening. The mastoid air cells and middle ear cavities are free of fluid. The orbits are normal. MRA HEAD FINDINGS POSTERIOR CIRCULATION: --Vertebral arteries: Normal V4 segments. --Posterior inferior cerebellar arteries (PICA): The left PICA and AICA share a common origin. Normal origin of right PICA from the ipsilateral vertebral artery. --Anterior inferior cerebellar arteries (AICA): Patent origins from the basilar artery. --Basilar artery: Normal. --Superior cerebellar arteries: Normal. --Posterior cerebral arteries (PCA): The right PCA is occluded at the P2-3 junction with distal reconstitution. The left PCA is occluded as short segment of the proximal P2 segment. The distal left PCA is patent. ANTERIOR CIRCULATION: --Intracranial internal carotid arteries: Normal. --Anterior cerebral arteries (ACA): Multifocal stenosis of the left anterior cerebral artery with areas of intermittent loss of normal flow related enhancement. --Middle cerebral arteries (MCA): Short segment loss of enhancement of the right middle cerebral artery distal M1 segment. The M2 branches are normal. The  left M1 segment is normal. There is moderate stenosis of the left M2 inferior division. IMPRESSION: 1. 2-3 small foci of acute to early subacute ischemia within the posterior right temporal lobe, within the PCA territory. 2. 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 3. Short segment occlusion of the right MCA M1 segment, just proximal to the bifurcation, with normal M2 branches. 4. Moderate stenosis of the left M2 inferior division. Electronically Signed   By: Ulyses Jarred M.D.   On: 06/27/2019 00:42    Vas US Carotid (at [place] Only)   Result Date: 06/27/2019 Carotid Arterial Duplex Study Indications:       TIA. Risk Factors:      Hypertension, hyperlipidemia, Diabetes, PAD. Comparison Study:  no prior Performing Technologist: Abram Sander RVS  Examination Guidelines: A complete evaluation includes B-mode imaging, spectral Doppler, color Doppler, and power Doppler as needed of all accessible portions of each vessel. Bilateral testing is considered an integral part of a complete examination. Limited examinations for reoccurring indications may be performed as noted.  Right Carotid Findings: +----------+--------+--------+--------+------------------------+--------+             PSV cm/s EDV cm/s Stenosis Describe  Comments  +----------+--------+--------+--------+------------------------+--------+  CCA Prox   48       10                calcific and homogeneous           +----------+--------+--------+--------+------------------------+--------+  CCA Distal 38       11                homogeneous                        +----------+--------+--------+--------+------------------------+--------+  ICA Prox   31       11       1-39%    homogeneous                        +----------+--------+--------+--------+------------------------+--------+  ICA Distal 36       11                                                    +----------+--------+--------+--------+------------------------+--------+  ECA        42                                                            +----------+--------+--------+--------+------------------------+--------+ +----------+--------+-------+--------+-------------------+             PSV cm/s EDV cms Describe Arm Pressure (mmHG)  +----------+--------+-------+--------+-------------------+  Subclavian 45                                             +----------+--------+-------+--------+-------------------+ +---------+--------+--+--------+--+---------+  Vertebral PSV cm/s 42 EDV cm/s 13 Antegrade  +---------+--------+--+--------+--+---------+  Left Carotid Findings: +----------+--------+--------+--------+--------+--------+             PSV cm/s EDV cm/s Stenosis Describe Comments  +----------+--------+--------+--------+--------+--------+  CCA Prox   46       8                 calcific           +----------+--------+--------+--------+--------+--------+  CCA Distal 58       16                calcific           +----------+--------+--------+--------+--------+--------+  ICA Prox   59       13       1-39%    calcific           +----------+--------+--------+--------+--------+--------+  ICA Distal 45       17                                   +----------+--------+--------+--------+--------+--------+  ECA        74       11                                   +----------+--------+--------+--------+--------+--------+ +----------+--------+--------+--------+-------------------+  Subclavian PSV cm/s EDV cm/s Describe Arm Pressure (mmHG)  +----------+--------+--------+--------+-------------------+             66                                              +----------+--------+--------+--------+-------------------+ +---------+--------+--+--------+-+---------+  Vertebral PSV cm/s 30 EDV cm/s 8 Antegrade  +---------+--------+--+--------+-+---------+  Summary: Right Carotid: Velocities in the right ICA are consistent with a 1-39%  stenosis. Left Carotid: Velocities in the left ICA are consistent with a 1-39% stenosis. Vertebrals: Bilateral vertebral arteries demonstrate antegrade flow. *See table(s) above for measurements and observations.  Electronically signed by Harold Barban MD on 06/27/2019 at 12:29:25 PM.    Final             Medical Problem List and Plan: 1.  Left-sided weakness with slurred speech secondary to acute early subacute ischemia within the posterior right temporal lobe within the PCA territory.             -admit to inpatient rehab 2.  Antithrombotics: -DVT/anticoagulation: Eliquis             -antiplatelet therapy: Aspirin 325 mg daily 3. Pain Management: Tylenol as needed 4. Mood: Provide emotional support             -antipsychotic agents: N/A 5. Neuropsych: This patient is capable of making decisions on her own behalf. 6. Skin/Wound Care: Routine skin checks 7. Fluids/Electrolytes/Nutrition: Routine in and outs with follow-up chemistries 8.  PAF.  Cardiac rate controlled and irregular on exam today.  Continue Eliquis 9.  Hypertension.  Permissive hypertension.  Toprol 12.5 mg daily.  Monitor with increased mobility and adjust regimen as needed 10.  Diabetes mellitus.  Hemoglobin A1c 7.4.  SSI.  Patient on Glucophage 500 mg twice daily prior to admission.  Resume as needed based on trends 11.  Hyperlipidemia.  Lipitor 12.  Peripheral vascular disease with history of left SFA angioplasty and stenting in the past.  Continue low-dose aspirin 13.  Decreased nutritional storage.  Dietary follow-up.  Gentle IV fluids.             -protein supp   Post Admission Physician Evaluation: 1. Functional deficits secondary  to right temporal lobe infarct. 2. Patient is admitted to receive collaborative, interdisciplinary care between the physiatrist, rehab nursing staff, and therapy team. 3. Patient's level of medical complexity and substantial therapy needs in context of that medical necessity cannot be  provided at a lesser intensity of care such as a SNF. 4. Patient has experienced substantial functional loss from his/her baseline which was documented above under the "Functional History" and "Functional Status" headings.  Judging by the patient's diagnosis, physical exam, and functional history, the patient has potential for functional progress which will result in measurable gains while on inpatient rehab.  These gains will be of substantial and practical use upon discharge  in facilitating mobility and self-care at the household level. 5. Physiatrist will provide 24 hour management of medical needs as well as oversight of the therapy plan/treatment and provide guidance as appropriate regarding the interaction of the two. 6. The Preadmission Screening has been reviewed and patient status is unchanged unless otherwise stated above. 7. 24 hour rehab nursing will assist with bladder management, bowel management, safety, skin/wound care, disease management, medication administration, pain management and patient education  and help integrate therapy concepts,  techniques,education, etc. 8. PT will assess and treat for/with: Lower extremity strength, range of motion, stamina, balance, functional mobility, safety, adaptive techniques and equipment, NMR.   Goals are: supervision. 9. OT will assess and treat for/with: ADL's, functional mobility, safety, upper extremity strength, adaptive techniques and equipment, NMR, family ed.   Goals are: supervision. Therapy may proceed with showering this patient. 10. SLP will assess and treat for/with: cognition, communication.  Goals are: supervision. 11. Case Management and Social Worker will assess and treat for psychological issues and discharge planning. 12. Team conference will be held weekly to assess progress toward goals and to determine barriers to discharge. 13. Patient will receive at least 3 hours of therapy per day at least 5 days per week. 14. ELOS: 11-14  days       15. Prognosis:  excellent  I have personally performed a face to face diagnostic evaluation of this patient and formulated the key components of the plan.  Additionally, I have personally reviewed laboratory data, imaging studies, as well as relevant notes and concur with the physician assistant's documentation above.  Meredith Staggers, MD, FAAPMR    Lavon Paganini Pringle, PA-C 06/28/2019

## 2019-06-28 NOTE — Evaluation (Signed)
Physical Therapy Evaluation Patient Details Name: [name] MRN: [id number] DOB: 01-20-1928 Today's Date: 06/28/2019   History of Present Illness  This 85 y.o. female admitted with transient slurred speech, Lt UE numbness and weakness which subsequently improved.  MRI of the brain revealed 213 foci of acute to early subacute ischemia within the posterior Rt temporal lobe within the PCA terrirtory.  PMH includes: A-Fib, DM, RA, PAD  Clinical Impression  Pt admitted with above diagnosis. Pt currently with functional limitations due to the deficits listed below (see PT Problem List). At the time of PT eval pt was able to perform transfers and ambulation with up to min assist and +2 for chair follow/safety. Son present and confirmed that pt has had a decline in function after seeing her ambulate in the hallway. Noted pt with L side inattention and mild L side weakness compared to the R side. Feel this patient would benefit from continued skilled therapy services at the CIR level to maximize functional independence and safety prior to return home with family support. Acutely, pt will benefit from skilled PT to increase their independence and safety with mobility to allow discharge to the venue listed below.      Follow Up Recommendations CIR;Supervision/Assistance - 24 hour    Equipment Recommendations  None recommended by PT    Recommendations for Other Services       Precautions / Restrictions Precautions Precautions: Fall      Mobility  Bed Mobility Overal bed mobility: Needs Assistance Bed Mobility: Supine to Sit;Sit to Supine     Supine to sit: Min guard;Min assist     General bed mobility comments: Pt was able to transition to EOB with increased time and min guard for trunk elevation to full sitting position. Noted posterior lean once sitting EOB and min assist provided for support to correct.   Transfers Overall transfer level: Needs assistance Equipment used: Rolling  walker (2 wheeled) Transfers: Sit to/from [facility] Sit to Stand: Min assist Stand pivot transfers: Min assist       General transfer comment: Pt with poor walker safety.  She tends to push walker very far in front of her, and leans anteriorly. Assist required to control descent to [facility].   Ambulation/Gait Ambulation/Gait assistance: Min assist;+2 safety/equipment Gait Distance (Feet): 75 Feet Assistive device: Rolling walker (2 wheeled) Gait Pattern/deviations: Step-through pattern;Decreased stride length;Drifts right/left Gait velocity: Decreased Gait velocity interpretation: <1.8 ft/sec, indicate of risk for recurrent falls General Gait Details: Noted pt with uncoordinated advancement of LE's initially with decreased heel strike progressing to toe walking - L side worse than R side. Pt with L side inattention and had difficulty avoiding obstacles on the L side in the hall. Even with cues to go around an obstacle in the hall she still hit it with her walker. Chair follow utilized and pt was given a ride back to her room due to fatigue after ambulating 75'.  Stairs            Wheelchair Mobility    Modified Rankin (Stroke Patients Only) Modified Rankin (Stroke Patients Only) Pre-Morbid Rankin Score: Moderate disability Modified Rankin: Moderately severe disability     Balance Overall balance assessment: Needs assistance Sitting-balance support: Feet supported Sitting balance-Leahy Scale: Fair     Standing balance support: Single extremity supported Standing balance-Leahy Scale: Poor Standing balance comment: requires UE support  Pertinent Vitals/Pain Pain Assessment: No/denies pain    Home Living Family/patient expects to be discharged to:: Private residence Living Arrangements: Spouse/significant other Available Help at Discharge: Family Type of Home: House Home Access: [place]:  One [place]: Environmental consultant - 2 wheels;Tub bench;Cane - single point      Prior Function Level of Independence: Independent with assistive device(s)   Gait / Transfers Assistance Needed: son reports she was ambulating with a RW but mod I with housework, ADLs, and ambulation  ADL's / Homemaking Assistance Needed: Pt reports she was performing ADLs mod I, and that she was driving and doing light house cleaning, including mopping floors.  Son present and confirms pt can run errands to the bank, etc but husband has been doing the grocery shopping for them.        Hand Dominance   Dominant Hand: Right    Extremity/Trunk Assessment   Upper Extremity Assessment Upper Extremity Assessment: LUE deficits/detail;RUE deficits/detail RUE Deficits / Details: Decreased AROM from injury years ago.  LUE Deficits / Details: Mild weakness and difficulty gripping walker at times. LUE Coordination: decreased gross motor;decreased fine motor    Lower Extremity Assessment Lower Extremity Assessment: RLE deficits/detail;LLE deficits/detail RLE Deficits / Details: Generalized weakness - grossly 4-/5 bilaterally in quads and hams but difficult to MMT because pt was getting confused with verbal directions. LLE Deficits / Details: Difficult to assess strength 2 cognition but coordination appeared to be slightly impaired compared to RLE. Questionable proprioception deficits in bilateral feet as pt not planting heel during gait training. Towards end of gait training noted pt began elevating R heel as well.     Cervical / Trunk Assessment Cervical / Trunk Assessment: Kyphotic  Communication   Communication: No difficulties  Cognition Arousal/Alertness: Awake/alert;Lethargic Behavior During Therapy: WFL for tasks assessed/performed;Impulsive(at times) Overall Cognitive Status: Impaired/Different from baseline Area of Impairment: Attention;Following commands;Problem solving;Awareness                    Current Attention Level: Sustained   Following Commands: Follows one step commands consistently;Follows one step commands inconsistently   Awareness: Intellectual Problem Solving: Slow processing;Difficulty sequencing;Requires verbal cues;Requires tactile cues;Decreased initiation General Comments: Pt initially oriented x4, however son present at end of session and asking some of the same orientation questions and pt was not able to correctly respond. Noted slow processing and perseveration on straps of posey belt that was donned when PT entered.       General Comments      Exercises     Assessment/Plan    PT Assessment Patient needs continued PT services  PT Problem List Decreased strength;Decreased activity tolerance;Decreased balance;Decreased mobility;Decreased knowledge of use of DME;Decreased cognition;Decreased coordination;Decreased safety awareness;Decreased knowledge of precautions       PT Treatment Interventions DME instruction;Gait training;Functional mobility training;Therapeutic activities;Therapeutic exercise;Balance training;Cognitive remediation;Neuromuscular re-education;Patient/family education    PT Goals (Current goals can be found in the Care Plan section)  Acute Rehab PT Goals Patient Stated Goal: she did not state  PT Goal Formulation: With patient Time For Goal Achievement: 07/12/19 Potential to Achieve Goals: Good Additional Goals Additional Goal #1: Pt will score >19 on DGI to indicate a lower risk for falls.    Frequency Min 4X/week   Barriers to discharge        Co-evaluation               AM-PAC PT "6 Clicks" Mobility  Outcome Measure Help  needed turning from your back to your side while in a flat bed without using bedrails?: None Help needed moving from lying on your back to sitting on the side of a flat bed without using bedrails?: A Little Help needed moving to and from a bed to a chair (including a wheelchair)?: A  Little Help needed standing up from a chair using your arms (e.g., wheelchair or bedside chair)?: A Little Help needed to walk in hospital room?: A Little Help needed climbing 3-5 steps with a railing? : A Lot 6 Click Score: 18    End of Session Equipment Utilized During Treatment: Gait belt Activity Tolerance: Patient tolerated treatment well Patient left: in chair;with call bell/phone within reach;with chair alarm set;with family/visitor present(Son, [name] present) Nurse Communication: Mobility status PT Visit Diagnosis: Unsteadiness on feet (R26.81);Other symptoms and signs involving the nervous system (R29.898)    Time: 6440-3474 PT Time Calculation (min) (ACUTE ONLY): 33 min   Charges:   PT Evaluation $PT Eval Moderate Complexity: 1 Mod PT Treatments $Gait Training: 8-22 mins        Rolinda Roan, PT, DPT Acute Rehabilitation Services Pager: [phone] Office: [place] 06/28/2019, 1:01 PM

## 2019-06-28 NOTE — TOC Transition Note (Signed)
Transition of Care [facility]) - CM/SW Discharge Note   Patient Details  Name: [name] MRN: [id number] Date of Birth: 10/15/1928  [facility] CM/SW Contact:  Pollie Friar, RN Phone Number: 06/28/2019, 1:55 PM   Clinical Narrative:    Pt is discharging to CIR today. CM is signing off.   Final next level of care: IP Rehab Facility Barriers to Discharge: No Barriers Identified   Patient Goals and CMS Choice        Discharge Placement                       Discharge Plan and Services                                     Social Determinants of Health (SDOH) Interventions     Readmission Risk Interventions No flowsheet data found.

## 2019-06-29 ENCOUNTER — Inpatient Hospital Stay (HOSPITAL_COMMUNITY): Payer: Medicare Other

## 2019-06-29 ENCOUNTER — Inpatient Hospital Stay (HOSPITAL_COMMUNITY): Payer: Medicare Other | Admitting: Speech Pathology

## 2019-06-29 ENCOUNTER — Inpatient Hospital Stay (HOSPITAL_COMMUNITY): Payer: Medicare Other | Admitting: Physical Therapy

## 2019-06-29 LAB — CBC WITH DIFFERENTIAL/PLATELET
Abs Immature Granulocytes: 0.04 10*3/uL (ref 0.00–0.07)
Basophils Absolute: 0.1 10*3/uL (ref 0.0–0.1)
Basophils Relative: 1 %
Eosinophils Absolute: 0.2 10*3/uL (ref 0.0–0.5)
Eosinophils Relative: 1 %
HCT: 51.1 % — ABNORMAL HIGH (ref 36.0–46.0)
Hemoglobin: 17.5 g/dL — ABNORMAL HIGH (ref 12.0–15.0)
Immature Granulocytes: 0 %
Lymphocytes Relative: 13 %
Lymphs Abs: 1.4 10*3/uL (ref 0.7–4.0)
MCH: 32.5 pg (ref 26.0–34.0)
MCHC: 34.2 g/dL (ref 30.0–36.0)
MCV: 95 fL (ref 80.0–100.0)
Monocytes Absolute: 1.1 10*3/uL — ABNORMAL HIGH (ref 0.1–1.0)
Monocytes Relative: 10 %
Neutro Abs: 8.1 10*3/uL — ABNORMAL HIGH (ref 1.7–7.7)
Neutrophils Relative %: 75 %
Platelets: 205 10*3/uL (ref 150–400)
RBC: 5.38 MIL/uL — ABNORMAL HIGH (ref 3.87–5.11)
RDW: 12.7 % (ref 11.5–15.5)
WBC: 10.8 10*3/uL — ABNORMAL HIGH (ref 4.0–10.5)
nRBC: 0 % (ref 0.0–0.2)

## 2019-06-29 LAB — COMPREHENSIVE METABOLIC PANEL
ALT: 20 U/L (ref 0–44)
AST: 23 U/L (ref 15–41)
Albumin: 3.6 g/dL (ref 3.5–5.0)
Alkaline Phosphatase: 56 U/L (ref 38–126)
Anion gap: 12 (ref 5–15)
BUN: 28 mg/dL — ABNORMAL HIGH (ref 8–23)
CO2: 22 mmol/L (ref 22–32)
Calcium: 8.9 mg/dL (ref 8.9–10.3)
Chloride: 106 mmol/L (ref 98–111)
Creatinine, Ser: 0.86 mg/dL (ref 0.44–1.00)
GFR calc Af Amer: >60 mL/min (ref >60)
GFR calc non Af Amer: >60 mL/min (ref >60)
Glucose, Bld: 160 mg/dL — ABNORMAL HIGH (ref 70–99)
Potassium: 3.7 mmol/L (ref 3.5–5.1)
Sodium: 140 mmol/L (ref 135–145)
Total Bilirubin: 1.8 mg/dL — ABNORMAL HIGH (ref 0.3–1.2)
Total Protein: 6.3 g/dL — ABNORMAL LOW (ref 6.5–8.1)

## 2019-06-29 LAB — GLUCOSE, CAPILLARY
Glucose-Capillary: 139 mg/dL — ABNORMAL HIGH (ref 70–99)
Glucose-Capillary: 257 mg/dL — ABNORMAL HIGH (ref 70–99)
Glucose-Capillary: 170 mg/dL — ABNORMAL HIGH (ref 70–99)
Glucose-Capillary: 89 mg/dL (ref 70–99)
Glucose-Capillary: 171 mg/dL — ABNORMAL HIGH (ref 70–99)

## 2019-06-29 MED ORDER — LEVETIRACETAM 500 MG PO TABS
500.0000 mg | ORAL_TABLET | Freq: Two times a day (BID) | ORAL | Status: DC
  Administered 2019-06-29 – 2019-07-02 (×6): 500 mg via ORAL
  Filled 2019-06-29 (×6): qty 1

## 2019-06-29 MED ORDER — SODIUM CHLORIDE 0.9 % IV BOLUS
250.0000 mL | Freq: Once | INTRAVENOUS | Status: AC
  Administered 2019-06-29: 250 mL via INTRAVENOUS

## 2019-06-29 MED ORDER — LORAZEPAM 2 MG/ML IJ SOLN
INTRAMUSCULAR | Status: AC
  Filled 2019-06-29: qty 1

## 2019-06-29 MED ORDER — LEVETIRACETAM IN NACL 1000 MG/100ML IV SOLN
1000.0000 mg | INTRAVENOUS | Status: AC
  Administered 2019-06-29: 1000 mg via INTRAVENOUS
  Filled 2019-06-29: qty 100

## 2019-06-29 MED ORDER — ASPIRIN EC 81 MG PO TBEC
81.0000 mg | DELAYED_RELEASE_TABLET | Freq: Every day | ORAL | Status: DC
  Administered 2019-06-30 – 2019-07-13 (×14): 81 mg via ORAL
  Filled 2019-06-29 (×14): qty 1

## 2019-06-29 MED ORDER — LORAZEPAM 2 MG/ML IJ SOLN
1.0000 mg | Freq: Once | INTRAMUSCULAR | Status: AC
  Administered 2019-06-29: 1 mg via INTRAVENOUS

## 2019-06-29 MED ORDER — SODIUM CHLORIDE 0.9 % IV SOLN
75.0000 mL/h | INTRAVENOUS | Status: DC
  Administered 2019-06-29 – 2019-06-30 (×2): 75 mL/h via INTRAVENOUS

## 2019-06-29 MED ORDER — METOPROLOL SUCCINATE ER 25 MG PO TB24
25.0000 mg | ORAL_TABLET | Freq: Every day | ORAL | Status: DC
  Administered 2019-06-30 – 2019-07-13 (×14): 25 mg via ORAL
  Filled 2019-06-29 (×14): qty 1

## 2019-06-29 NOTE — Progress Notes (Signed)
[facility] PHYSICAL MEDICINE & REHABILITATION PROGRESS NOTE   Subjective/Complaints: I saw pt this morning. Had a good night. No issues. PT was working with patient when she became less responsive with more of a lean to left.   ROS: Limited due to cognitive/behavioral    Objective:   Vas US Carotid (at [facility] Only)  Result Date: 06/27/2019 Carotid Arterial Duplex Study Indications:       TIA. Risk Factors:      Hypertension, hyperlipidemia, Diabetes, PAD. Comparison Study:  no prior Performing Technologist: Abram Sander RVS  Examination Guidelines: A complete evaluation includes B-mode imaging, spectral Doppler, color Doppler, and power Doppler as needed of all accessible portions of each vessel. Bilateral testing is considered an integral part of a complete examination. Limited examinations for reoccurring indications may be performed as noted.  Right Carotid Findings: +----------+--------+--------+--------+------------------------+--------+           PSV cm/sEDV cm/sStenosisDescribe                Comments +----------+--------+--------+--------+------------------------+--------+ CCA Prox  48      10              calcific and homogeneous         +----------+--------+--------+--------+------------------------+--------+ CCA Distal38      11              homogeneous                      +----------+--------+--------+--------+------------------------+--------+ ICA Prox  31      11      1-39%   homogeneous                      +----------+--------+--------+--------+------------------------+--------+ ICA Distal36      11                                               +----------+--------+--------+--------+------------------------+--------+ ECA       42                                                       +----------+--------+--------+--------+------------------------+--------+ +----------+--------+-------+--------+-------------------+           PSV cm/sEDV  cmsDescribeArm Pressure (mmHG) +----------+--------+-------+--------+-------------------+ [id number]                                         +----------+--------+-------+--------+-------------------+ +---------+--------+--+--------+--+---------+ VertebralPSV cm/s42EDV cm/s13Antegrade +---------+--------+--+--------+--+---------+  Left Carotid Findings: +----------+--------+--------+--------+--------+--------+           PSV cm/sEDV cm/sStenosisDescribeComments +----------+--------+--------+--------+--------+--------+ CCA Prox  46      8               calcific         +----------+--------+--------+--------+--------+--------+ CCA Distal58      16              calcific         +----------+--------+--------+--------+--------+--------+ ICA Prox  59      13      1-39%   calcific         +----------+--------+--------+--------+--------+--------+ ICA Distal45  17                               +----------+--------+--------+--------+--------+--------+ ECA       74      11                               +----------+--------+--------+--------+--------+--------+ +----------+--------+--------+--------+-------------------+ SubclavianPSV cm/sEDV cm/sDescribeArm Pressure (mmHG) +----------+--------+--------+--------+-------------------+           66                                          +----------+--------+--------+--------+-------------------+ +---------+--------+--+--------+-+---------+ VertebralPSV cm/s30EDV cm/s8Antegrade +---------+--------+--+--------+-+---------+  Summary: Right Carotid: Velocities in the right ICA are consistent with a 1-39% stenosis. Left Carotid: Velocities in the left ICA are consistent with a 1-39% stenosis. Vertebrals: Bilateral vertebral arteries demonstrate antegrade flow. *See table(s) above for measurements and observations.  Electronically signed by Harold Barban MD on 06/27/2019 at 12:29:25 PM.    Final    Recent  Labs    06/28/19 0636 06/29/19 0551  WBC 14.1* 10.8*  HGB 18.3* 17.5*  HCT 53.3* 51.1*  PLT 225 205   Recent Labs    06/28/19 0636 06/29/19 0551  NA 140 140  K 4.0 3.7  CL 105 106  CO2 17* 22  GLUCOSE 211* 160*  BUN 31* 28*  CREATININE 1.13* 0.86  CALCIUM 9.1 8.9    Intake/Output Summary (Last 24 hours) at 06/29/2019 0841 Last data filed at 06/28/2019 2126 Gross per 24 hour  Intake 360 ml  Output -  Net 360 ml     Physical Exam: Vital Signs Blood pressure (!) 156/103, pulse 98, temperature 98.1 F (36.7 C), resp. rate 18, height 5\' 3"  (1.6 m), weight 50 kg, SpO2 99 %. Constitutional: No distress . Vital signs reviewed. HEENT: EOMI, oral membranes moist Neck: supple Cardiovascular: RRR without murmur. No JVD    Respiratory: CTA Bilaterally without wheezes or rales. Normal effort    GI: BS +, non-tender, non-distended  Musculoskeletal:  General: No deformityor edema.  Neurological: Lethargic but arouses easily. Opens eyes. Oriented to person, place. Left central 7, tongue deviation. LUE 2+ to 3-/5, LLE 2+ to 3- prox to distally.  RUE and RLE 3/5. Senses pain and light touch in all 4's.  Delayed procesing still. . Skin: Missing 3rd and 5th toes right foot and 3rd toe left foot. bpg scars LLE Psychiatric:  Flat, but generally pleasant and cooperative    Assessment/Plan: 1. Functional deficits secondary to right temporal lobe/PCA distribution infarct which require 3+ hours per day of interdisciplinary therapy in a comprehensive inpatient rehab setting.  Physiatrist is providing close team supervision and 24 hour management of active medical problems listed below.  Physiatrist and rehab team continue to assess barriers to discharge/monitor patient progress toward functional and medical goals  Care Tool:  Bathing              Bathing assist       Upper Body Dressing/Undressing Upper body dressing        Upper body assist      Lower Body  Dressing/Undressing Lower body dressing            Lower body assist       Toileting Toileting    Toileting assist Assist  for toileting: Contact Guard/Touching assist     Transfers Chair/bed transfer  Transfers assist           Locomotion Ambulation   Ambulation assist              Walk 10 feet activity   Assist           Walk 50 feet activity   Assist           Walk 150 feet activity   Assist           Walk 10 feet on uneven surface  activity   Assist           Wheelchair     Assist               Wheelchair 50 feet with 2 turns activity    Assist            Wheelchair 150 feet activity     Assist          Medical Problem List and Plan: 1.Left-sided weakness with slurred speechsecondary to acute early subacute ischemia within the posterior right temporal lobe within the PCA territory. -pt with acute changes including lethargy, increased left sided weakness this morning when working with PT   -ordered stat head CT   -EEG   -neuro consult   -IVF, supportive care   -already on eliquis, received dose this morning.  2. Antithrombotics: -DVT/anticoagulation:Eliquis -antiplatelet therapy: Aspirin 325 mg daily 3. Pain Management:Tylenol as needed 4. Mood:Provide emotional support -antipsychotic agents: N/A 5. Neuropsych: This patientiscapable of making decisions on herown behalf. 6. Skin/Wound Care:Routine skin checks 7. Fluids/Electrolytes/Nutrition:encourage appropriate PO  -I personally reviewed the patient's labs today.  Generally stable, still dry 8. PAF. Cardiac rate controlled and irregular on exam today. Continue Eliquis 9. Hypertension. Permissive hypertension.Toprol 12.5 mg daily.  -increase toprol to 25mg  bid starting with evening dose    10. Diabetes mellitus. Hemoglobin A1c 7.4. SSI. Patient on Glucophage 500 mg twice daily  prior to admission. Resume as needed based on trends  -fair control at present 11. Hyperlipidemia. Lipitor 12.Peripheral vascular disease with history of left SFA angioplasty and stenting in the past. Continue low-dose aspirin 13.Decreased nutritional storage. Dietary follow-up.  IV fluids. -protein supp    Greater than 35 total minutes was spent examining patient, formulating a treatment plan, and in discussion with patient and family.      LOS: 1 days A FACE TO [place] 06/29/2019, 8:41 AM

## 2019-06-29 NOTE — Progress Notes (Signed)
EEG Completed; Results Pending  

## 2019-06-29 NOTE — Discharge Instructions (Addendum)
Inpatient Rehab Discharge Instructions  [name] Discharge date and time: No discharge date for patient encounter.   Activities/Precautions/ Functional Status: Activity: activity as tolerated Diet: diabetic diet Wound Care: none needed Functional status:  ___ No restrictions     ___ Walk up steps independently ___ 24/7 supervision/assistance   ___ Walk up steps with assistance ___ Intermittent supervision/assistance  ___ Bathe/dress independently ___ Walk with walker     _x__ Bathe/dress with assistance ___ Walk Independently    ___ Shower independently ___ Walk with assistance    ___ Shower with assistance ___ No alcohol     ___ Return to work/school ________  COMMUNITY REFERRALS UPON DISCHARGE:   Home Health:   PT     OT     ST     Agency:  [facility] Phone:  [phone] Medical Equipment/Items Ordered:  You have all recommended equipment already at home.   GENERAL COMMUNITY RESOURCES FOR PATIENT/FAMILY: Support Groups:  [facility] Stroke Support Group - on hold for COVID-19                              Meets the second Thursday of each month from 6 - 7 PM (except June, July, August)                              [facility], [address], [place]                              For more information, call [phone]  Special Instructions: No smoking driving or alcohol STROKE/TIA DISCHARGE INSTRUCTIONS SMOKING Cigarette smoking nearly doubles your risk of having a stroke & is the single most alterable risk factor  If you smoke or have smoked in the last 12 months, you are advised to quit smoking for your health.  Most of the excess cardiovascular risk related to smoking disappears within a year of stopping.  Ask you doctor about anti-smoking medications  [place] Quit Line: 1-800-QUIT NOW  Free Smoking Cessation Classes [phone]  CHOLESTEROL Know your levels; limit fat & cholesterol in your  diet  Lipid Panel     Component Value Date/Time   CHOL 187 06/27/2019 0411   TRIG 170 (H) 06/27/2019 0411   HDL 27 (L) 06/27/2019 0411   CHOLHDL 6.9 06/27/2019 0411   VLDL 34 06/27/2019 0411   LDLCALC 126 (H) 06/27/2019 0411      Many patients benefit from treatment even if their cholesterol is at goal.  Goal: Total Cholesterol (CHOL) less than 160  Goal:  Triglycerides (TRIG) less than 150  Goal:  HDL greater than 40  Goal:  LDL (LDLCALC) less than 100   BLOOD PRESSURE American Stroke Association blood pressure target is less that 120/80 mm/Hg  Your discharge blood pressure is:  BP: (!) 156/103  Monitor your blood pressure  Limit your salt and alcohol intake  Many individuals will require more than one medication for high blood pressure  DIABETES (A1c is a blood sugar average for last 3 months) Goal HGBA1c is under 7% (HBGA1c is blood sugar average for last 3 months)  Diabetes:     Lab Results  Component Value Date   HGBA1C 7.4 (H) 06/27/2019     Your HGBA1c can be lowered  with medications, healthy diet, and exercise.  Check your blood sugar as directed by your physician  Call your physician if you experience unexplained or low blood sugars.  PHYSICAL ACTIVITY/REHABILITATION Goal is 30 minutes at least 4 days per week  Activity: Increase activity slowly, Therapies: Physical Therapy: Home Health Return to work:   Activity decreases your risk of heart attack and stroke and makes your heart stronger.  It helps control your weight and blood pressure; helps you relax and can improve your mood.  Participate in a regular exercise program.  Talk with your doctor about the best form of exercise for you (dancing, walking, swimming, cycling).  DIET/WEIGHT Goal is to maintain a healthy weight  Your discharge diet is:  Diet Order            Diet heart healthy/carb modified Room service appropriate? Yes; Fluid consistency: Thin  Diet effective now               liquids Your height is:  Height: 5\' 3"  (160 cm) Your current weight is: Weight: 50 kg Your Body Mass Index (BMI) is:  BMI (Calculated): 19.53  Following the type of diet specifically designed for you will help prevent another stroke.  Your goal weight range is:    Your goal Body Mass Index (BMI) is 19-24.  Healthy food habits can help reduce 3 risk factors for stroke:  High cholesterol, hypertension, and excess weight.  RESOURCES Stroke/Support Group:  Call [phone]   STROKE EDUCATION PROVIDED/REVIEWED AND GIVEN TO PATIENT Stroke warning signs and symptoms How to activate emergency medical system (call 911). Medications prescribed at discharge. Need for follow-up after discharge. Personal risk factors for stroke. Pneumonia vaccine given:  Flu vaccine given:  My questions have been answered, the writing is legible, and I understand these instructions.  I will adhere to these goals & educational materials that have been provided to me after my discharge from the hospital.      My questions have been answered and I understand these instructions. I will adhere to these goals and the provided educational materials after my discharge from the hospital.  Patient/Caregiver Signature _______________________________ Date __________  Clinician Signature _______________________________________ Date __________  Please bring this form and your medication list with you to all your follow-up doctor's appointments.     Information on my medicine - ELIQUIS (apixaban)  This medication education was reviewed with me or my healthcare representative as part of my discharge preparation.   Why was Eliquis prescribed for you? Eliquis was prescribed for you to reduce the risk of forming blood clots that can cause a stroke if you have a medical condition called atrial fibrillation (a type of irregular heartbeat) OR to reduce the risk of a blood clots forming after orthopedic surgery.  What do  You need to know about Eliquis ? Take your Eliquis TWICE DAILY - one tablet in the morning and one tablet in the evening with or without food.  It would be best to take the doses about the same time each day.  If you have difficulty swallowing the tablet whole please discuss with your pharmacist how to take the medication safely.  Take Eliquis exactly as prescribed by your doctor and DO NOT stop taking Eliquis without talking to the doctor who prescribed the medication.  Stopping may increase your risk of developing a new clot or stroke.  Refill your prescription before you run out.  After discharge, you should have regular check-up appointments with your healthcare  provider that is prescribing your Eliquis.  In the future your dose may need to be changed if your kidney function or weight changes by a significant amount or as you get older.  What do you do if you miss a dose? If you miss a dose, take it as soon as you remember on the same day and resume taking twice daily.  Do not take more than one dose of ELIQUIS at the same time.  Important Safety Information A possible side effect of Eliquis is bleeding. You should call your healthcare provider right away if you experience any of the following: ? Bleeding from an injury or your nose that does not stop. ? Unusual colored urine (red or dark brown) or unusual colored stools (red or black). ? Unusual bruising for unknown reasons. ? A serious fall or if you hit your head (even if there is no bleeding).  Some medicines may interact with Eliquis and might increase your risk of bleeding or clotting while on Eliquis. To help avoid this, consult your healthcare provider or pharmacist prior to using any new prescription or non-prescription medications, including herbals, vitamins, non-steroidal anti-inflammatory drugs (NSAIDs) and supplements.  This website has more information on Eliquis (apixaban): [email or web address].no.

## 2019-06-29 NOTE — Progress Notes (Signed)
Social Work Patient ID: [name], female   DOB: [birth year], 85 y.o.   MRN: [id number]   Due to pt's unstable medical condition currently, CSW did not visit with pt and family as this would not be an appropriate time to complete assessment.  CSW will continue to follow at a distance and be available to family once pt is medically stable.

## 2019-06-29 NOTE — Progress Notes (Signed)
SLP Cancellation Note  Patient Details Name: [name] MRN: [id number] DOB: [birth year]   Cancelled treatment:       Patient is on medical hold per MD.                                                                                                Weston Anna 06/29/2019, 1:36 PM

## 2019-06-29 NOTE — Consult Note (Signed)
Stroke Neurology Consultation Note  Consult Requested by:  Alger Simons, MD (Physical Medicine & Rehabtilitation)   Reason for Consult: episodes of altered mental status   Consult Date:  06/29/19  The history was obtained from the Dr. Naaman Plummer, Ms. Love over the phone and Mr. [place] and pt son at bedside.  During history and examination, all items were not able to obtain unless otherwise noted.  History of Present Illness:  [name] is an 85 y.o. Caucasian female with PMH of Afib on Coumadin, diabetes mellitus, rheumatoid arthritis, HLD, HTN, RA, peripheral artery disease who was admitted from 06/26/2019-06/28/2019 after presenteing with transient speech difficulties, left arm weakness / numbness and confusion who was found to have a posterior R temporal lobe infarct due to Afib with subtherapeutic INR 1.9.  She was on warfarin and aspirin 325 at the time of admission; Coumadin was changed to Eliquis and discharged to CIR on aspirin 325 and Eliquis (unable to tolerate Pradaxa previously).  Lipitor 40 was added. Toprol and Metformin were continued for HTN and DM control. She has been having cognitive decline and losing independence w/ ADLs; agitation required ativan which worsened orientation and confusion. She had an episode in hospital on 06/27/19 when a RN witnessed R eyes deviation but she was coherent and answering questions which quickly resolved.  Patient admitted to rehab 06/28/2019.  As per son, in rehab yesterday, pt intermittently confused, asking same questions again and again, fidget with both hands. As per rehab PA, this morning, she ate breakfast normally at her baseline, however when she was talking with PT, she had acute onset episode of unresponsive with slurry speech. Put in bed, pt still drowsy and dysarthria but orientated and following commands. No shaking or jerking. CT showed no acute chanage and EEG ordered, put on IVF and reduced ASA 325 to 81mg . Again around lunch time, pt had  episode of right gaze with unresponsiveness, lasting about 2-3 min and slowly came around but still lethargic. No shaking or jerking. However, not sure about urinary incontinence as she is on diaper.  Gave ativan 1mg  IV and loaded with keppra 1g. Neuro consult was called.    Past Medical History:  Diagnosis Date  . Acute osteomyelitis ([place]) 08/25/2018   Right toe  . Anemia   . Atrial fibrillation ([place])   . Carpal tunnel syndrome, bilateral   . Cellulitis 08/25/2018   Right foot  . Cervical lymphadenopathy   . Compression fracture of fifth lumbar vertebra ([facility])    to T 11  . DDD (degenerative disc disease), lumbar   . DJD (degenerative joint disease)   . DOE (dyspnea on exertion)   . Dysphagia   . Dyspnea    with exertion  . Dysrhythmia    hx brief AF post op hip surg  . Esophageal dysmotility 01/31/2014   noted barium  . GERD (gastroesophageal reflux disease)   . Hematoma    right side after stent placement  . History of hiatal hernia 01/31/2014   Small, noted on Barium  . Hyperlipidemia   . Hypertension   . Jaundice   . Low back pain   . MRSA (methicillin resistant Staphylococcus aureus)   . Neuropathy   . Nocturia   . OA (osteoarthritis)   . Osteoporosis   . Ovarian cyst   . PAD (peripheral artery disease) ([place])   . Peripheral vascular disease ([place])   . Pleural effusion, left 04/17/2017   Small  . RA (rheumatoid arthritis) ([place])   .  Spinal stenosis   . Toe ulcer, right, with necrosis of bone ([place]) 08/25/2018  . Type 2 diabetes mellitus ([place])   . Unsteady gait   . Urinary incontinence   . Uterine polyp   . Varicose vein of leg    Bilateral     Past Surgical History:  Procedure Laterality Date  . AMPUTATION Right 02/04/2013   Procedure: [name] ;  Surgeon: Wylene Simmer, MD;  Location: [facility];  Service: Orthopedics;  Laterality: Right;  . AMPUTATION TOE Right 09/03/2018   Procedure: AMPUTATION 3RD RIGHT TOE INTERPHALANGEAL;   Surgeon: [name], DPM;  Location: [facility];  Service: Podiatry;  Laterality: Right;  . APPENDECTOMY    . COLONOSCOPY    . EYE SURGERY     both cataracts  . FEMORAL ARTERY EXPLORATION Right 04/12/2017   Procedure: EVACUATION HEMATOMA RIGHT GROIN WITH FEMORAL ARTERY REPAIR;  Surgeon: Rosetta Posner, MD;  Location: [facility];  Service: Vascular;  Laterality: Right;  . INCISION AND DRAINAGE  2011   left hip inf-hemovac  . IR ANGIOGRAM EXTREMITY LEFT  03/25/2017  . IR ANGIOGRAM EXTREMITY LEFT  04/08/2017  . IR ANGIOGRAM SELECTIVE EACH ADDITIONAL VESSEL  03/25/2017  . IR FEM POP ART STENT INC PTA MOD SED  03/25/2017  . IR INFUSION THROMBOL ARTERIAL INITIAL (MS)  04/08/2017  . IR RADIOLOGIST EVAL & MGMT  02/26/2017  . IR RADIOLOGIST EVAL & MGMT  04/03/2017  . IR RADIOLOGIST EVAL & MGMT  08/20/2017  . IR RADIOLOGIST EVAL & MGMT  01/22/2018  . IR RADIOLOGIST EVAL & MGMT  01/13/2019  . IR THROMB F/U EVAL ART/VEN FINAL DAY (MS)  04/09/2017  . IR TIB-PERO ART PTA MOD SED  03/25/2017  . IR US GUIDE VASC ACCESS RIGHT  03/25/2017  . IR US GUIDE VASC ACCESS RIGHT  04/08/2017  . KNEE ARTHROSCOPY  1995   left  . NECK MASS EXCISION    . TONSILLECTOMY    . TOTAL HIP ARTHROPLASTY  2006   left-fx  . TOTAL KNEE ARTHROPLASTY  [id number],[id number]   left  . TOTAL KNEE ARTHROPLASTY  2009   right    Family History  Problem Relation Age of Onset  . Cancer Mother      Social History:  reports that she has never smoked. She has never used smokeless tobacco. She reports previous alcohol use. She reports that she does not use drugs.  Review of Systems: A full ROS was attempted today and was not able to be performed.    Allergies:  Allergies  Allergen Reactions  . Amprenavir Other (See Comments)    Unknown reaction  . Bactrim [Sulfamethoxazole-Trimethoprim] Nausea Only  . Ceftriaxone Sodium Other (See Comments)    Tingly feeling after given, then one hour later while vanco infusing pruritic rash  . Phenergan [Promethazine Hcl]  Other (See Comments)    Hallucinations  . Sulfonamide Derivatives Hives  . Vancomycin Hcl Rash    Rash happened 1hr after rocephin but immediately after infusing vancomycin     Test Results: CBC:  Recent Labs  Lab 06/26/19 1958 06/28/19 0636 06/29/19 0551  WBC 8.1 14.1* 10.8*  NEUTROABS 5.8  --  8.1*  HGB 17.6* 18.3* 17.5*  HCT 53.2* 53.3* 51.1*  MCV 97.1 94.7 95.0  PLT 197 225 664   Basic Metabolic Panel:  Recent Labs  Lab 06/28/19 0636 06/29/19 0551  NA 140 140  K 4.0 3.7  CL 105 106  CO2  17* 22  GLUCOSE 211* 160*  BUN 31* 28*  CREATININE 1.13* 0.86  CALCIUM 9.1 8.9   Liver Function Tests: Recent Labs  Lab 06/26/19 1958 06/29/19 0551  AST 21 23  ALT 20 20  ALKPHOS 66 56  BILITOT 0.9 1.8*  PROT 7.4 6.3*  ALBUMIN 4.1 3.6   No results for input(s): LIPASE, AMYLASE in the last 168 hours. No results for input(s): AMMONIA in the last 168 hours. Coagulation Studies:  Recent Labs    06/26/19 1958 06/27/19 0411  LABPROT 22.4* 21.3*  INR 2.0* 1.9*   Cardiac Enzymes: No results for input(s): CKTOTAL, CKMB, CKMBINDEX, TROPONINI in the last 168 hours. BNP: Invalid input(s): POCBNP CBG:  Recent Labs  Lab 06/28/19 1706 06/28/19 2317 06/29/19 0641 06/29/19 0907 06/29/19 1205  GLUCAP 138* 148* 139* 257* 170*   Lipid Panel:     Component Value Date/Time   CHOL 187 06/27/2019 0411   TRIG 170 (H) 06/27/2019 0411   HDL 27 (L) 06/27/2019 0411   CHOLHDL 6.9 06/27/2019 0411   VLDL 34 06/27/2019 0411   LDLCALC 126 (H) 06/27/2019 0411   HgbA1c:  Lab Results  Component Value Date   HGBA1C 7.4 (H) 06/27/2019   Ct Head Wo Contrast  Result Date: 06/29/2019 CLINICAL DATA:  Altered mental status with unclear cause EXAM: CT HEAD WITHOUT CONTRAST TECHNIQUE: Contiguous axial images were obtained from the base of the skull through the vertex without intravenous contrast. COMPARISON:  Brain MRI from 2 days ago FINDINGS: Brain: No evidence of acute infarction,  hemorrhage, hydrocephalus, extra-axial collection or mass lesion/mass effect. Chronic lacune in the left frontal white matter above the frontal horn. Age normal cerebral volume. Vascular: No hyperdense vessel or unexpected calcification. Skull: Osteopenic appearance.  No acute finding or lesion. Sinuses/Orbits: Opacified right frontal sinus that is stable and incidental to the history IMPRESSION: Stable exam.  No acute or reversible finding. Electronically Signed   By: Monte Fantasia M.D.   On: 06/29/2019 11:40   Ct Head Wo Contrast  Result Date: 06/26/2019 CLINICAL DATA:  Tingling in left hand. EXAM: CT HEAD WITHOUT CONTRAST TECHNIQUE: Contiguous axial images were obtained from the base of the skull through the vertex without intravenous contrast. COMPARISON:  October 13, 2007 FINDINGS: Brain: No subdural, epidural, or subarachnoid hemorrhage. Ventricles and sulci are unremarkable. Cerebellum, brainstem, and basal cisterns are normal. Mild white matter changes identified. No acute cortical ischemia or infarct is noted. No mass effect or midline shift is identified. Vascular: Calcified atherosclerosis is seen in the intracranial carotids. Skull: Normal. Negative for fracture or focal lesion. Sinuses/Orbits: Opacification of the right frontal sinus is identified. Paranasal sinuses, mastoid air cells, and middle ears are otherwise normal. Other: None. IMPRESSION: 1. No acute intracranial abnormalities are identified. Electronically Signed   By: Dorise Bullion III M.D   On: 06/26/2019 19:55   Mr Angio Head Wo Contrast  Result Date: 06/27/2019 CLINICAL DATA:  Left-sided facial droop and slurred speech with left hand weakness. Three episodes in last 2 days. EXAM: MRI HEAD WITHOUT CONTRAST MRA HEAD WITHOUT CONTRAST TECHNIQUE: Multiplanar, multiecho pulse sequences of the brain and surrounding structures were obtained without intravenous contrast. Angiographic images of the head were obtained using MRA technique  without contrast. COMPARISON:  Head CT 06/26/2019 Brain MRI 09/24/2009 FINDINGS: MRI HEAD FINDINGS BRAIN: There are 2-3 small areas of abnormal diffusion restriction within the posterior right temporal lobe (series 5, image 63). The white matter signal is normal for the patient's age.  The cerebral and cerebellar volume are age-appropriate. There is no hydrocephalus. The midline structures are normal. VASCULAR: There are 2 scattered foci of chronic microhemorrhage in the supratentorial brain. The major intracranial arterial and venous sinus flow voids are normal. SKULL AND UPPER CERVICAL SPINE: Calvarial bone marrow signal is normal. There is no skull base mass. The visualized upper cervical spine and soft tissues are normal. SINUSES/ORBITS: There are no fluid levels or advanced mucosal thickening. The mastoid air cells and middle ear cavities are free of fluid. The orbits are normal. MRA HEAD FINDINGS POSTERIOR CIRCULATION: --Vertebral arteries: Normal V4 segments. --Posterior inferior cerebellar arteries (PICA): The left PICA and AICA share a common origin. Normal origin of right PICA from the ipsilateral vertebral artery. --Anterior inferior cerebellar arteries (AICA): Patent origins from the basilar artery. --Basilar artery: Normal. --Superior cerebellar arteries: Normal. --Posterior cerebral arteries (PCA): The right PCA is occluded at the P2-3 junction with distal reconstitution. The left PCA is occluded as short segment of the proximal P2 segment. The distal left PCA is patent. ANTERIOR CIRCULATION: --Intracranial internal carotid arteries: Normal. --Anterior cerebral arteries (ACA): Multifocal stenosis of the left anterior cerebral artery with areas of intermittent loss of normal flow related enhancement. --Middle cerebral arteries (MCA): Short segment loss of enhancement of the right middle cerebral artery distal M1 segment. The M2 branches are normal. The left M1 segment is normal. There is moderate  stenosis of the left M2 inferior division. IMPRESSION: 1. 2-3 small foci of acute to early subacute ischemia within the posterior right temporal lobe, within the PCA territory. 2. 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 3. Short segment occlusion of the right MCA M1 segment, just proximal to the bifurcation, with normal M2 branches. 4. Moderate stenosis of the left M2 inferior division. Electronically Signed   By: Ulyses Jarred M.D.   On: 06/27/2019 00:42   Mr Brain Wo Contrast  Result Date: 06/27/2019 CLINICAL DATA:  Left-sided facial droop and slurred speech with left hand weakness. Three episodes in last 2 days. EXAM: MRI HEAD WITHOUT CONTRAST MRA HEAD WITHOUT CONTRAST TECHNIQUE: Multiplanar, multiecho pulse sequences of the brain and surrounding structures were obtained without intravenous contrast. Angiographic images of the head were obtained using MRA technique without contrast. COMPARISON:  Head CT 06/26/2019 Brain MRI 09/24/2009 FINDINGS: MRI HEAD FINDINGS BRAIN: There are 2-3 small areas of abnormal diffusion restriction within the posterior right temporal lobe (series 5, image 63). The white matter signal is normal for the patient's age. The cerebral and cerebellar volume are age-appropriate. There is no hydrocephalus. The midline structures are normal. VASCULAR: There are 2 scattered foci of chronic microhemorrhage in the supratentorial brain. The major intracranial arterial and venous sinus flow voids are normal. SKULL AND UPPER CERVICAL SPINE: Calvarial bone marrow signal is normal. There is no skull base mass. The visualized upper cervical spine and soft tissues are normal. SINUSES/ORBITS: There are no fluid levels or advanced mucosal thickening. The mastoid air cells and middle ear cavities are free of fluid. The orbits are normal. MRA HEAD FINDINGS POSTERIOR CIRCULATION: --Vertebral arteries: Normal V4  segments. --Posterior inferior cerebellar arteries (PICA): The left PICA and AICA share a common origin. Normal origin of right PICA from the ipsilateral vertebral artery. --Anterior inferior cerebellar arteries (AICA): Patent origins from the basilar artery. --Basilar artery: Normal. --Superior cerebellar arteries: Normal. --Posterior cerebral arteries (PCA): The right PCA is occluded at the P2-3  junction with distal reconstitution. The left PCA is occluded as short segment of the proximal P2 segment. The distal left PCA is patent. ANTERIOR CIRCULATION: --Intracranial internal carotid arteries: Normal. --Anterior cerebral arteries (ACA): Multifocal stenosis of the left anterior cerebral artery with areas of intermittent loss of normal flow related enhancement. --Middle cerebral arteries (MCA): Short segment loss of enhancement of the right middle cerebral artery distal M1 segment. The M2 branches are normal. The left M1 segment is normal. There is moderate stenosis of the left M2 inferior division. IMPRESSION: 1. 2-3 small foci of acute to early subacute ischemia within the posterior right temporal lobe, within the PCA territory. 2. 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 3. Short segment occlusion of the right MCA M1 segment, just proximal to the bifurcation, with normal M2 branches. 4. Moderate stenosis of the left M2 inferior division. Electronically Signed   By: Ulyses Jarred M.D.   On: 06/27/2019 00:42   Vas US Carotid (at [facility] Only)  Result Date: 06/27/2019 Carotid Arterial Duplex Study Indications:       TIA. Risk Factors:      Hypertension, hyperlipidemia, Diabetes, PAD. Comparison Study:  no prior Performing Technologist: Abram Sander RVS  Examination Guidelines: A complete evaluation includes B-mode imaging, spectral Doppler, color Doppler, and power Doppler as needed of all accessible portions of each  vessel. Bilateral testing is considered an integral part of a complete examination. Limited examinations for reoccurring indications may be performed as noted.  Right Carotid Findings: +----------+--------+--------+--------+------------------------+--------+           PSV cm/sEDV cm/sStenosisDescribe                Comments +----------+--------+--------+--------+------------------------+--------+ CCA Prox  48      10              calcific and homogeneous         +----------+--------+--------+--------+------------------------+--------+ CCA Distal38      11              homogeneous                      +----------+--------+--------+--------+------------------------+--------+ ICA Prox  31      11      1-39%   homogeneous                      +----------+--------+--------+--------+------------------------+--------+ ICA Distal36      11                                               +----------+--------+--------+--------+------------------------+--------+ ECA       42                                                       +----------+--------+--------+--------+------------------------+--------+ +----------+--------+-------+--------+-------------------+           PSV cm/sEDV cmsDescribeArm Pressure (mmHG) +----------+--------+-------+--------+-------------------+ [id number]                                         +----------+--------+-------+--------+-------------------+ +---------+--------+--+--------+--+---------+  VertebralPSV cm/s42EDV cm/s13Antegrade +---------+--------+--+--------+--+---------+  Left Carotid Findings: +----------+--------+--------+--------+--------+--------+           PSV cm/sEDV cm/sStenosisDescribeComments +----------+--------+--------+--------+--------+--------+ CCA Prox  46      8               calcific         +----------+--------+--------+--------+--------+--------+ CCA Distal58      16              calcific          +----------+--------+--------+--------+--------+--------+ ICA Prox  59      13      1-39%   calcific         +----------+--------+--------+--------+--------+--------+ ICA Distal45      17                               +----------+--------+--------+--------+--------+--------+ ECA       74      11                               +----------+--------+--------+--------+--------+--------+ +----------+--------+--------+--------+-------------------+ SubclavianPSV cm/sEDV cm/sDescribeArm Pressure (mmHG) +----------+--------+--------+--------+-------------------+           66                                          +----------+--------+--------+--------+-------------------+ +---------+--------+--+--------+-+---------+ VertebralPSV cm/s30EDV cm/s8Antegrade +---------+--------+--+--------+-+---------+  Summary: Right Carotid: Velocities in the right ICA are consistent with a 1-39% stenosis. Left Carotid: Velocities in the left ICA are consistent with a 1-39% stenosis. Vertebrals: Bilateral vertebral arteries demonstrate antegrade flow. *See table(s) above for measurements and observations.  Electronically signed by Harold Barban MD on 06/27/2019 at 12:29:25 PM.    Final     EKG: atrial fibrillation, rate 80  Physical Examination: Temp:  [97.4 F (36.3 C)-98.1 F (36.7 C)] 98.1 F (36.7 C) (08/11 0411) Pulse Rate:  [69-98] 80 (08/11 1221) Resp:  [18] 18 (08/11 0411) BP: (127-168)/(86-107) 168/96 (08/11 1221) SpO2:  [98 %-100 %] 100 % (08/11 1221) Weight:  [50 kg] 50 kg (08/10 1505)  General - Well nourished, well developed, lethargic.  Ophthalmologic - fundi not visualized due to noncooperation.  Cardiovascular - irregularly irregular heart rate and rhythm.  Neuro - lethargic, able to open eyes on voice, orientated to self, age, people, time and place.  Following simple commands, positive for speech, not able to name but able to repeat simple sentences.  PERRL,  blinking to visual threat bilaterally, no significant gaze deviation, tracking bilaterally.  Left nasolabial fold flattening, tongue midline.  Bilateral upper extremity 3/5, bilateral lower extremity 3/5 proximal and distal.  Sensation symmetrical, coordination not corporative, gait not tested.  DTR 1+, no Babinski.   Assessment:  Ms. [name] is a 85 y.o. female with history of posterior R temporal lobe infarct d/t AF w/ subtherapeutic INR on warfarin who was d/c to IP rehab where she developed 2 episode of altered mental status with R gaze preference and unresponsive state.  Concerning for seizure activity, treated with ativan and Keppra load.  EEG done showed no seizure.  Possible seizure - right gaze with unresponsiveness  Could be related to right MCA infarct  Ativan prn  Keppra 1 g load followed by 500 mg twice daily  EEG no seizure  Seizure precautions  Stroke:  Posterior R temporal lobe infarct embolic secondary to AF w/ subtherapeutic INR on warfarin  Now on Eliquis and aspirin  Aspirin decreased from 325 to 81 given bleeding risk  CT head stable. No new finding.  Ongoing CIR therapy recommended  Atrial Fibrillation . Continue Eliquis (apixaban) 2.5 mg twice daily . Rate controlled . On metoprolol   Hypertension  BP 150-160s . On norvasc 5, toprol XL 25 . BP goal normotensive  Hyperlipidemia  Continue Lipitor 40  LDL 126, goal < 70  Continue statin on discharge  Diabetes type II, Uncontrolled  HgbA1c 7.4, goal < 7.0  On SSI  CBG monitoring  Home meds: metformin  Other Stroke Risk Factors  Advanced age  Hx ETOH use  Other Active Problems  Cognitive decline  Hospital day # 1   Thank you for this consultation and allowing [place] to participate in the care of this patient.  Rosalin Hawking, MD PhD Stroke Neurology 06/29/2019 6:13 PM    To contact Stroke Continuity provider, please refer to [email or web address] After hours, contact General  Neurology

## 2019-06-29 NOTE — Progress Notes (Signed)
Patient this morning took her medications with no problems. Patient was alert and oriented and finishing breakfast. PT came to me to inform me that the patient was not acting the same and to take a look at her. Algis Liming, PA and Dr. Naaman Plummer were notified and came to see the patient. Monitoring the patient for any other changes.

## 2019-06-29 NOTE — Progress Notes (Signed)
Inpatient Rehabilitation  Patient information reviewed and entered into eRehab system by Suheyla Mortellaro M. Mahlia Fernando, M.A., CCC/SLP, PPS Coordinator.  Information including medical coding, functional ability and quality indicators will be reviewed and updated through discharge.    

## 2019-06-29 NOTE — Progress Notes (Signed)
Physical Therapy Note  Patient Details  Name: [name] MRN: [id number] Date of Birth: 21-May-1928 Today's Date: 06/29/2019    Pt received in bed & agreeable to tx & MD in to assess pt at beginning of evaluation. After MD left therapist educated pt on weekly team meetings, daily therapy schedule, need to call for staff assistance when wanting to get OOB & reviewed use of call bell. Pt provided PLOF & home set up information, with pt making minimal eye contact during conversation. Mid conversation pt becomes slurred & incoherent in speech with pt communicating a dry mouth with therapist offering water but pt not drinking after multiple times of straw placed in mouth. More L side facial droop observed & RN called to room & RN checked vitals. Therapist notified PA & MD of pt's significant difference in speech, alertness, & focus. Pt left in bed in care of RN & PA Jeannene Patella). Full PT evaluation not completed, will f/u when medically appropriate.   [name], PT, DPT  06/29/2019, 8:45 AM

## 2019-06-29 NOTE — Progress Notes (Signed)
Tiffany Staggers, [name]    Tiffany Staggers, [name]  Physician  Physical Medicine and Rehabilitation     PMR Pre-admission  Signed     Date of Service:  06/28/2019 12:53 PM        Related encounter: ED to Hosp-Admission (Discharged) from 06/26/2019 in [facility]             Signed                    Expand widget buttonCollapse widget button    Show:Clear all   ManualTemplateCopied  Added by:     Tiffany Staggers, [name]  Michel Santee, PT   Hover for detailscustomization button                                                                                                                                                                                    PMR Admission Coordinator Pre-Admission Assessment     Patient: [name] is an 85 y.o., female  MRN: [id number]  DOB: [birth year]  Height: _0  (160 cm)  Weight: 50 kg     Insurance Information  HMO:     PPO:      PCP:      IPA:      80/20:      OTHER:   PRIMARY: Medicare A and B      Policy#: [id number]      Subscriber: patient  CM Name:       Phone#:      Fax#:   Pre-Cert#: verified eligibility online      Employer:   Benefits:  Phone #:      Name:   Eff. Date: 07/20/1995     Deduct: $1408      Out of Pocket Max: n/a      Life Max: n/a  CIR: 100%      SNF: 20 full days  Outpatient:      Co-Pay: 20%  Home Health: 100%      Co-Pay:   DME: 80%     Co-Pay: 20%  Providers: pt choice   SECONDARY: BCBS Supplement     Policy#: [id number]      Subscriber:   CM Name:       Phone#:      Fax#:   Pre-Cert#:       Employer:   Benefits:  Phone #:      Name:   Eff. Date:      Deduct:       Out  of Pocket Max:       Life Max:   CIR:       SNF:    Outpatient:      Co-Pay:   Home Health:       Co-Pay:   DME:      Co-Pay:      Medicaid Application Date:       Case Manager:   Disability Application Date:       Case Worker:      The "Data Collection Information Summary" for patients in Inpatient Rehabilitation Facilities with attached "Privacy Act [place] Records" was provided and verbally reviewed with: Patient and Family     Emergency Contact Information           Contact Information            Name    Relation    Home    Work    Mobile         Alcan Border   Spouse   [phone]       [phone]        Menden,Denise   Relative   [phone]                [facility]   Relative   [phone]       [phone]        Sherie, Dobrowolski   [phone]       [phone]                Current Medical History   Patient Admitting Diagnosis: CVA      History of Present Illness: [name] is an 85 year old right-handed female with history of PAF maintained on Coumadin, peripheral vascular disease status post left SFA angioplasty and stenting, type 2 diabetes mellitus, hypertension, hyperlipidemia.  Presented 06/26/2019 with left-sided weakness and slurred speech that has been transient over 2 days.  No reports of chest pain or shortness of breath.  Blood pressure in the ED 143/95-213/112.  INR on admission of 2.00, COVID negative.  Cranial CT scan negative for acute changes.  Patient did not receive TPA.  MRI of the brain showed small foci of acute early subacute ischemia within the posterior right temporal lobe within the PCA territory.  MR angiogram showed short segment occlusion of both posterior cerebral arteries, the P2-3 junction on the right and proximal P2 segment on the left.  Short segment occlusion of the right MCA M1 segment just proximal to the bifurcation with normal M2 branches.  Carotid Dopplers with no  significant ICA stenosis.  Echocardiogram with ejection fraction of 61% normal systolic function.  Neurology consulted Coumadin transition to Eliquis as well as remaining on aspirin therapy.  Tolerating a regular consistency diet.  Appetite has been limited started on gentle IV fluids.  Therapy evaluations completed and patient requiring overall min to mod assist for mobility and ADLs.      Complete NIHSS TOTAL: 0     Patient's medical record from [facility] has been reviewed by the rehabilitation admission coordinator and physician.     Past Medical History        Past Medical History:    Diagnosis   Date    .   Acute osteomyelitis ([place])   08/25/2018        Right toe    .   Anemia        .   Atrial fibrillation ([place])        .  Carpal tunnel syndrome, bilateral        .   Cellulitis   08/25/2018        Right foot    .   Cervical lymphadenopathy        .   Compression fracture of fifth lumbar vertebra (HCC)            to T 11    .   DDD (degenerative disc disease), lumbar        .   DJD (degenerative joint disease)        .   DOE (dyspnea on exertion)        .   Dysphagia        .   Dyspnea            with exertion    .   Dysrhythmia            hx brief AF post op hip surg    .   Esophageal dysmotility   01/31/2014        noted barium    .   GERD (gastroesophageal reflux disease)        .   Hematoma            right side after stent placement    .   History of hiatal hernia   01/31/2014        Small, noted on Barium    .   Hyperlipidemia        .   Hypertension        .   Jaundice        .   Low back pain        .   MRSA (methicillin resistant Staphylococcus aureus)        .   Neuropathy        .   Nocturia        .   OA (osteoarthritis)        .   Osteoporosis         .   Ovarian cyst        .   PAD (peripheral artery disease) ([place])        .   Peripheral vascular disease ([place])        .   Pleural effusion, left   04/17/2017        Small    .   RA (rheumatoid arthritis) ([place])        .   Spinal stenosis        .   Toe ulcer, right, with necrosis of bone ([place])   08/25/2018    .   Type 2 diabetes mellitus ([place])        .   Unsteady gait        .   Urinary incontinence        .   Uterine polyp        .   Varicose vein of leg            Bilateral         Family History    family history includes Cancer in her mother.     Prior Rehab/Hospitalizations  Has the patient had prior rehab or hospitalizations prior to admission? No     Has the patient had major surgery during 100 days prior to admission? No  Current Medications    Current Facility-Administered Medications:   .  0.9 %  sodium chloride infusion, , Intravenous, Continuous, Danford, Suann Larry, [name]  .  acetaminophen (TYLENOL) tablet 650 mg, 650 mg, Oral, Q4H PRN **OR** acetaminophen (TYLENOL) solution 650 mg, 650 mg, Per Tube, Q4H PRN **OR** acetaminophen (TYLENOL) suppository 650 mg, 650 mg, Rectal, Q4H PRN, Posey Pronto, Vishal R, [name]  .  amLODipine (NORVASC) tablet 5 mg, 5 mg, Oral, Daily, Danford, Suann Larry, [name], 5 mg at 06/28/19 1227  .  apixaban (ELIQUIS) tablet 2.5 mg, 2.5 mg, Oral, BID, Eugenie Filler, [name], 2.5 mg at 06/28/19 1225  .  aspirin suppository 300 mg, 300 mg, Rectal, Daily **OR** aspirin tablet 325 mg, 325 mg, Oral, Daily, Zada Finders R, [name], 325 mg at 06/28/19 1225  .  atorvastatin (LIPITOR) tablet 40 mg, 40 mg, Oral, q1800, Lenore Cordia, [name], 40 mg at 06/27/19 2131  .  hydrALAZINE (APRESOLINE) injection 5 mg, 5 mg, Intravenous, Q6H PRN, Danford, Christopher P, [name]  .  insulin aspart (novoLOG) injection 0-9 Units, 0-9 Units, Subcutaneous, TID WC, Lenore Cordia, [name], 3 Units at 06/28/19 1227  .  metoprolol succinate (TOPROL-XL) 24 hr tablet 12.5 mg, 12.5 mg, Oral, Daily, Eugenie Filler, [name], 12.5 mg at 06/28/19 1225  .  senna-docusate (Senokot-S) tablet 1 tablet, 1 tablet, Oral, QHS PRN, Lenore Cordia, [name]     Patients Current Diet:       Diet Order                                       Diet heart healthy/carb modified Room service appropriate? Yes; Fluid consistency: Thin  Diet effective now                              Precautions / Restrictions  Precautions  Precautions: Fall      Has the patient had 2 or more falls or a fall with injury in the past year? 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 Mod I     Indoor Mobility: Did the patient need assistance with walking from room to room (with or without device)? Mod I      Stairs: Did the patient need assistance with internal or external stairs (with or without device)? Mod I     Functional Cognition: Did the patient need help planning regular tasks such as shopping or remembering to take medications? independent     [place] / Equipment  Home Equipment: Walker - 2 wheels, Tub bench, Cane - single point     Prior Device Use: Indicate devices/aids used by the patient prior to current illness, exacerbation or injury? Walker     Current Functional Level   Cognition      Arousal/Alertness: Awake/alert  Overall Cognitive Status: Impaired/Different from baseline  Current Attention Level: Sustained  Orientation Level: Disoriented X4  Following Commands: Follows one step commands consistently, Follows one step commands inconsistently  General Comments: Pt initially oriented x4, however son  present at end of session and asking some of the same orientation questions and pt was  not able to correctly respond. Noted slow processing and perseveration on straps of posey belt that was donned when PT entered.        Extremity Assessment  (includes Sensation/Coordination)      Upper Extremity Assessment: LUE deficits/detail, RUE deficits/detail  RUE Deficits / Details: Decreased AROM from injury years ago.   LUE Deficits / Details: Mild weakness and difficulty gripping walker at times.  LUE Coordination: decreased gross motor, decreased fine motor   Lower Extremity Assessment: RLE deficits/detail, LLE deficits/detail  RLE Deficits / Details: (P) Generalized weakness - grossly 4-/5 bilaterally in quads and hams but difficult to MMT because pt was getting confused with verbal directions.  LLE Deficits / Details: (P) Difficult to assess strength 2 cognition but coordination appeared to be slightly impaired compared to RLE. Questionable proprioception deficits in bilateral feet as pt not planting heel during gait training. Towards end of gait training noted pt began elevating R heel as well.         ADLs      Overall ADL's : Needs assistance/impaired  Eating/Feeding: Set up, Bed level  Grooming: Wash/dry hands, Min guard, Standing  Upper Body Bathing: Minimal assistance, Sitting  Lower Body Bathing: Moderate assistance, Sit to/from stand  Upper Body Dressing : Minimal assistance, Sitting  Lower Body Dressing: Moderate assistance, Sit to/from stand  Lower Body Dressing Details (indicate cue type and reason): frequently drops socks   Toilet Transfer: Minimal assistance, RW  Toilet Transfer Details (indicate cue type and reason): mod verbal cues for walker safety   Toileting- Clothing Manipulation and Hygiene: Minimal assistance, Sit to/from stand  Functional mobility during ADLs: Minimal assistance, Rolling walker        Mobility      Overal bed  mobility: Needs Assistance  Bed Mobility: Supine to Sit, Sit to Supine  Supine to sit: (P) Min guard, Min assist  Sit to supine: Min assist  General bed mobility comments: (P) Pt was able to transition to EOB with increased time and min guard for trunk elevation to full sitting position. Noted posterior lean once sitting EOB and min assist provided for support to correct.         Transfers      Overall transfer level: Needs assistance  Equipment used: (P) Rolling walker (2 wheeled)  Transfers: Sit to/from Stand, Stand Pivot Transfers  Sit to Stand: Min assist  Stand pivot transfers: Min assist  General transfer comment: (P) Pt with poor walker safety.  She tends to push walker very far in front of her, and leans anteriorly. Assist required to control descent to [facility].         Ambulation / Gait / Stairs / Wheelchair Mobility      Ambulation/Gait  Ambulation/Gait assistance: (P) Min assist, +2 safety/equipment  Gait Distance (Feet): (P) 75 Feet  Assistive device: (P) Rolling walker (2 wheeled)  Gait Pattern/deviations: (P) Step-through pattern, Decreased stride length, Drifts right/left  General Gait Details: (P) Noted pt with uncoordinated advancement of LE's initially with decreased heel strike progressing to toe walking - L side worse than R side. Pt with L side inattention and had difficulty avoiding obstacles on the L side in the hall. Even with cues to go around an obstacle in the hall she still hit it with her walker. Chair follow utilized and pt was given a ride back to her room due to fatigue after ambulating 75'.  Gait velocity: (P) Decreased  Gait velocity interpretation: (P) <1.8 ft/sec, indicate of  risk for recurrent falls        Posture / Balance   Balance  Overall balance assessment: Needs assistance  Sitting-balance support: Feet supported  Sitting balance-Leahy Scale: Fair  Standing balance support: Single extremity supported  Standing  balance-Leahy Scale: Poor  Standing balance comment: requires UE support         Special needs/care consideration   BiPAP/CPAP no  CPM no  Continuous Drip IV no  Dialysis no        Days n/a  Life Vest no  Oxygen no  Special Bed no  Trach Size no  Wound Vac (area) no      Location n/a  Skin intact                            Bowel mgmt: last BM prior to admission, continent  Bladder mgmt: incontinent, purewick  Diabetic mgmt: yes  Behavioral consideration no  Chemo/radiation no       Previous Home Environment (from acute therapy documentation)  Living Arrangements: Spouse/significant other   Lives With: Spouse  Available Help at Discharge: Family  Type of Home: House  Home Layout: One level  Home Access: Ramped entrance  Bathroom Shower/Tub: Administrator, Civil Service: Standard  Additional Comments: Pt indicates spouse can assist her at discharge      Discharge Living Setting  Plans for Discharge Living Setting: Patient's home  Type of Home at Discharge: House  Discharge Home Layout: One level  Discharge Home Access: Ramped entrance  Discharge Bathroom Shower/Tub: Tub/shower unit  Discharge Bathroom Toilet: Standard  Discharge Bathroom Accessibility: Yes  How Accessible: Accessible via walker  Does the patient have any problems obtaining your medications?: No     Social/Family/Support Systems  Patient Roles: Spouse, Parent  Anticipated Caregiver: pts spouse, and adult children  Anticipated Caregiver's Contact Information: Mosetta Anis [phone], husband Nori Riis [phone]  Ability/Limitations of Caregiver: [name] states family is agreeable to 24/7 supervision if needed  Caregiver Availability: 24/7  Discharge Plan Discussed with Primary Caregiver: Yes  Is Caregiver In Agreement with Plan?: Yes  Does Caregiver/Family have Issues with Lodging/Transportation while Pt is in Rehab?: No     Goals/Additional  Needs  Patient/Family Goal for Rehab: PT/OT/SLP supervision  Expected length of stay: 12-14 days  Dietary Needs: carb modified/thin  Equipment Needs: tbd  Additional Information: reviewed visitor policy with [name]  Pt/Family Agrees to Admission and willing to participate: Yes  Program Orientation Provided & Reviewed with Pt/Caregiver Including Roles  & Responsibilities: Yes     Decrease burden of Care through IP rehab admission: n/a     Possible need for SNF placement upon discharge: No.  Family aware of plan to d/c home with family supervision and agreeable to providing 24/7.      Patient Condition: I have reviewed medical records from [facility], spoken with CM, and patient and son. I met with patient at the bedside for inpatient rehabilitation assessment.  Patient will benefit from ongoing PT, OT and SLP, can actively participate in 3 hours of therapy a day 5 days of the week, and can make measurable gains during the admission.  Patient will also benefit from the coordinated team approach during an Inpatient Acute Rehabilitation admission.  The patient will receive intensive therapy as well as Rehabilitation physician, nursing, social worker, and care management interventions.  Due to safety, disease management, medication administration, pain management and patient education the patient requires 61  hour a day rehabilitation nursing.  The patient is currently min to mod with mobility and basic ADLs.  Discharge setting and therapy post discharge at home with home health is anticipated.  Patient has agreed to participate in the Acute Inpatient Rehabilitation Program and will admit today.     Preadmission Screen Completed By:  Michel Santee, PT, DPT 06/28/2019 12:54 PM  ______________________________________________________________________    Discussed status with Dr. Naaman Plummer on 06/28/19  at 1:02 PM  and received approval for admission today.     Admission Coordinator:   Michel Santee, PT, DPT time 1:02 PM Sudie Grumbling 06/28/19       Assessment/Plan:  Diagnosis: right temporal infarct  1.Does the need for close, 24 hr/day Medical supervision in concert with the patient's rehab needs make it unreasonable for this patient to be served in a less intensive setting? Yes   2.Co-Morbidities requiring supervision/potential complications: PAF, PAD   3.Due to bladder management, bowel management, safety, skin/wound care, disease management, medication administration, pain management and patient education, does the patient require 24 hr/day rehab nursing? Yes   4.Does the patient require coordinated care of a physician, rehab nurse, PT (1-2 hrs/day, 5 days/week), OT (1-2 hrs/day, 5 days/week) and SLP (1-2 hrs/day, 5 days/week) to address physical and functional deficits in the context of the above medical diagnosis(es)? Yes Addressing deficits in the following areas: balance, endurance, locomotion, strength, transferring, bowel/bladder control, bathing, dressing, feeding, grooming, toileting, cognition and psychosocial support   5.Can the patient actively participate in an intensive therapy program of at least 3 hrs of therapy 5 days a week? Yes   6.The potential for patient to make measurable gains while on inpatient rehab is excellent   7.Anticipated functional outcomes upon discharge from inpatients are: supervision PT, supervision OT, supervision SLP   8.Estimated rehab length of stay to reach the above functional goals is: 11-14 days   9.Anticipated D/C setting: Home   10.Anticipated post D/C treatments: [place] therapy   11.Overall Rehab/Functional Prognosis: excellent      [name] Signature:  Tiffany Staggers, [name], [place] [facility]  06/28/2019                Revision History

## 2019-06-29 NOTE — Progress Notes (Signed)
Patient developed unresponsiveness with slurred speech during attempts at PT evaluation.   Seen lying in bed with soft slurred speech. Oriented X 3 and able to follow commands but had sleepy and had hard time staying awake. CT head ordered stat as well as EEG to rule out seizure. Bolus with 250 cc IVF and start 75 cc/hr. Bed rest. Contacted Dr. Glenna Durand questioned need for ASA 325 mg--> could it be decreased to 81 mg--> relayed that it's home dose. He doubted seizure due to small size of stroke and did not want to start any meds till we have definite diagnosis. He will look out for CT/EEG reports and follow up for recommendations.

## 2019-06-29 NOTE — Procedures (Signed)
Patient Name: [name]  MRN: [id number]  Epilepsy Attending: Lora Havens  Referring Physician/Provider: Olin Hauser love PA Date: 06/29/2019 Duration: 23.47 mins  Patient history: 85yo with right gaze deviation. EEG to evaluate for seizure.   Level of alertness: asleep  Technical aspects: This EEG study was done with scalp electrodes positioned according to the 10-20 International system of electrode placement. Electrical activity was acquired at a sampling rate of 500Hz  and reviewed with a high frequency filter of 70Hz  and a low frequency filter of 1Hz . EEG data were recorded continuously and digitally stored.   DESCRIPTION: No clear posterior dominant rhythm was seen. Sleep was characterized by vertex waves and sleep spindles (12-14Hz ), maximal frontocentral. Sharp transients were seen in left fronto-centro-temporal region. Hyperventilation and photic stimulation were not performed.  IMPRESSION: This study is within normal limits. No seizures or definite epileptiform discharges were seen throughout the recording. If suspicion for interictal activity remains a concern, a prolonged study should be considered.   [name] [name]

## 2019-06-29 NOTE — Progress Notes (Signed)
Occupational Therapy Note  Patient Details  Name: [name] MRN: [id number] Date of Birth: 1928-07-08  Today's Date: 06/29/2019 OT Missed Time: 36 Minutes Missed Time Reason: MD hold (comment)  Pt on medical hold pending work up. OT will follow up per MD orders.   Curtis Sites OTR/L  06/29/2019, 12:59 PM

## 2019-06-29 NOTE — Progress Notes (Signed)
Called back to room with patient suddenly staring to right, minimally responsive. VSS. Felt that this was likely a seizure especially since CT without acute findings. EKG stable with a fib  Pt was given ativan 1mg  IVP and within 60-90 seconds began attending to left and verbalizing slightly.   Ordered loading dose of keppra 1000mg  IV now.   Spoke again to neurology, Dr. Erlinda Hong who will be following up.   Son in room. Spoke to him re: plan  Continue IV fluid, oxygen, supportive measures with q1 hour neuro checks, seizure precautions.   I have personally performed a face to face diagnostic evaluation of this patient and formulated the key components of the plan.  Additionally, I have personally reviewed laboratory data, imaging studies, as well as relevant notes and concur with the physician assistant's documentation above.  Meredith Staggers, MD, Mellody Drown

## 2019-06-29 NOTE — Patient Care Conference (Signed)
Inpatient RehabilitationTeam Conference and Plan of Care Update Date: 06/29/2019   Time: 10:31 AM    Patient Name: ZALEY TALLEY      Medical Record Number: [id number]  Date of Birth: 1928/05/23 Sex: Female         Room/Bed: 4W03C/4W03C-01 Payor Info: Payor: MEDICARE / Plan: MEDICARE PART A AND B / Product Type: *No Product type* /    Admitting Diagnosis: 1. TBI Team RT. CVA  Admit Date/Time:  06/28/2019  2:36 PM Admission Comments: No comment available   Primary Diagnosis:  <principal problem not specified> Principal Problem: <principal problem not specified>  Patient Active Problem List   Diagnosis Date Noted  . 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[place]) 06/28/2019  . CVA (cerebral vascular accident) ([place]) 06/27/2019  . TIA (transient ischemic attack) 06/26/2019  . Hyperlipidemia   . Type 2 diabetes mellitus ([place])   . Degeneration of lumbar intervertebral disc 12/11/2017  . Cough   . Hematoma   . Groin swelling 04/12/2017  . Hematoma of groin 04/12/2017  . Critical lower limb ischemia 04/08/2017  . Long term (current) use of anticoagulants [Z79.01] 12/13/2015  . Closed dislocation of tarsometatarsal joint 10/05/2015  . ULCER OF OTHER PART OF FOOT 06/06/2010  . Essential hypertension 03/26/2010  . ATRIAL FIBRILLATION 03/26/2010  . CUTANEOUS ERUPTIONS, DRUG-INDUCED 03/26/2010  . DIARRHEA 03/26/2010  . GERD 03/20/2010  . UNSPEC LOCAL INFECTION SKIN&SUBCUTANEOUS TISSUE 03/20/2010  . OSTEOARTHRITIS 03/20/2010  . ARTHRITIS, SEPTIC 03/14/2010  . OTH COMPLICATIONS DUE INTERNAL JOINT PROSTHESIS 02/22/2010    Expected Discharge Date: Expected Discharge Date: (TBD)  Team Members Present: Physician leading conference: Dr. Alger Simons Social Worker Present: Alfonse Alpers, LCSW Nurse Present: Frances Maywood, RN PT Present: Lavone Nian, PT OT Present: Laverle Hobby, OT SLP Present: Weston Anna, SLP PPS Coordinator present : Gunnar Fusi, SLP     Current Status/Progress  Goal Weekly Team Focus  Medical   right temporal infact, new cva today/extension, HTN  improve functional mobility  stabilize neurologically   Bowel/Bladder   Continent x2 LBM 8/10 reported by patient.  continue to be continent  assess toileting qshift and PRN   Swallow/Nutrition/ Hydration   Eval Pending         ADL's   Eval Pending         Mobility   unable to complete full eval 2/2 medical event during session  not set yet  TBD   Communication   Eval Pending         Safety/Cognition/ Behavioral Observations  Eval Pending         Pain   no pain reported  pain less than 3  assess pain management qshift and PRN   Skin   no skin issues  Continue to be free of skin impairments.  assess skin qshift and PRN    Rehab Goals Patient on target to meet rehab goals: (cannot be determined at this time) Rehab Goals Revised: evaluation day interrupted today due to medical event *See Care Plan and progress notes for long and short-term goals.     Barriers to Discharge  Current Status/Progress Possible Resolutions Date Resolved   Physician    Medical stability               Nursing                  PT                    OT  SLP                SW                Discharge Planning/Teaching Needs:  Pt's husband and adult children plan to care for pt at home.  CSW will discuss this with pt/family once it is appropriate given new medical event.  Family will be offered education prior to pt returning home.   Team Discussion:  Pt with right temporal infarct with possible extension this morning.  Pt to have CT and EEG today to see what pt has going on.  No evaluations have been able to be completed as a result of the above.  CSW will meet with pt and family when appropriate.  Revisions to Treatment Plan:  none    Continued Need for Acute Rehabilitation Level of Care: The patient requires daily medical management by a physician with specialized training in physical  medicine and rehabilitation for the following conditions: Daily direction of a multidisciplinary physical rehabilitation program to ensure safe treatment while eliciting the highest outcome that is of practical value to the patient.: Yes Daily medical management of patient stability for increased activity during participation in an intensive rehabilitation regime.: Yes Daily analysis of laboratory values and/or radiology reports with any subsequent need for medication adjustment of medical intervention for : Neurological problems   I attest that I was present, lead the team conference, and concur with the assessment and plan of the team.Team conference was held via web/ teleconference due to [place] - 19.   Irja Wheless, Silvestre Mesi 06/29/2019, 10:31 AM

## 2019-06-29 NOTE — Significant Event (Signed)
Called to room by son, reported "think she is having a stroke or seizure", found pt lying in bed eyes open and rt gaze preference, unable to speak however noted delayed response with reaching out and squeezing of  Rt hand when called. Obtained vitals and contacted Alcide Goodness PAC to assess pt. And situation. Much different than previous assessment as pt unable to speak and rt gaze, glazed look was not noted previously. Margarito Liner

## 2019-06-30 ENCOUNTER — Ambulatory Visit: Payer: Medicare Other | Admitting: Podiatry

## 2019-06-30 ENCOUNTER — Inpatient Hospital Stay (HOSPITAL_COMMUNITY): Payer: Medicare Other | Admitting: Speech Pathology

## 2019-06-30 ENCOUNTER — Inpatient Hospital Stay (HOSPITAL_COMMUNITY): Payer: Medicare Other | Admitting: Physical Therapy

## 2019-06-30 ENCOUNTER — Inpatient Hospital Stay (HOSPITAL_COMMUNITY): Payer: Medicare Other

## 2019-06-30 DIAGNOSIS — E1165 Type 2 diabetes mellitus with hyperglycemia: Secondary | ICD-10-CM

## 2019-06-30 DIAGNOSIS — D72829 Elevated white blood cell count, unspecified: Secondary | ICD-10-CM

## 2019-06-30 DIAGNOSIS — I480 Paroxysmal atrial fibrillation: Secondary | ICD-10-CM

## 2019-06-30 DIAGNOSIS — R7309 Other abnormal glucose: Secondary | ICD-10-CM

## 2019-06-30 LAB — GLUCOSE, CAPILLARY
Glucose-Capillary: 132 mg/dL — ABNORMAL HIGH (ref 70–99)
Glucose-Capillary: 240 mg/dL — ABNORMAL HIGH (ref 70–99)
Glucose-Capillary: 282 mg/dL — ABNORMAL HIGH (ref 70–99)
Glucose-Capillary: 160 mg/dL — ABNORMAL HIGH (ref 70–99)

## 2019-06-30 MED ORDER — MUSCLE RUB 10-15 % EX CREA
TOPICAL_CREAM | Freq: Four times a day (QID) | CUTANEOUS | Status: DC | PRN
  Administered 2019-06-30 – 2019-07-12 (×4): via TOPICAL
  Filled 2019-06-30: qty 85

## 2019-06-30 NOTE — Evaluation (Signed)
Speech Language Pathology Assessment and Plan  Patient Details  Name: [name] MRN: [id number] Date of Birth: 05/20/1928  SLP Diagnosis: Cognitive Impairments  Rehab Potential: Fair ELOS: 2-2.5 weeks    Today's Date: 06/30/2019 SLP Individual Time: 1300-1400 SLP Individual Time Calculation (min): 60 min   Problem List:  Patient Active Problem List   Diagnosis Date Noted  . Leukocytosis   . Controlled type 2 diabetes mellitus with hyperglycemia, without long-term current use of insulin ([place])   . Labile blood glucose   . PAF (paroxysmal atrial fibrillation) ([place])   . 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[place]) 06/28/2019  . CVA (cerebral vascular accident) ([place]) 06/27/2019  . TIA (transient ischemic attack) 06/26/2019  . Hyperlipidemia   . Type 2 diabetes mellitus ([place])   . Degeneration of lumbar intervertebral disc 12/11/2017  . Cough   . Hematoma   . Groin swelling 04/12/2017  . Hematoma of groin 04/12/2017  . Critical lower limb ischemia 04/08/2017  . Long term (current) use of anticoagulants [Z79.01] 12/13/2015  . Closed dislocation of tarsometatarsal joint 10/05/2015  . ULCER OF OTHER PART OF FOOT 06/06/2010  . Essential hypertension 03/26/2010  . ATRIAL FIBRILLATION 03/26/2010  . CUTANEOUS ERUPTIONS, DRUG-INDUCED 03/26/2010  . DIARRHEA 03/26/2010  . GERD 03/20/2010  . UNSPEC LOCAL INFECTION SKIN&SUBCUTANEOUS TISSUE 03/20/2010  . OSTEOARTHRITIS 03/20/2010  . ARTHRITIS, SEPTIC 03/14/2010  . OTH COMPLICATIONS DUE INTERNAL JOINT PROSTHESIS 02/22/2010   Past Medical History:  Past Medical History:  Diagnosis Date  . Acute osteomyelitis ([place]) 08/25/2018   Right toe  . Anemia   . Atrial fibrillation ([place])   . Carpal tunnel syndrome, bilateral   . Cellulitis 08/25/2018   Right foot  . Cervical lymphadenopathy   . Compression fracture of fifth lumbar vertebra ([facility])    to T 11  . DDD (degenerative disc disease), lumbar   . DJD (degenerative joint disease)   . DOE  (dyspnea on exertion)   . Dysphagia   . Dyspnea    with exertion  . Dysrhythmia    hx brief AF post op hip surg  . Esophageal dysmotility 01/31/2014   noted barium  . GERD (gastroesophageal reflux disease)   . Hematoma    right side after stent placement  . History of hiatal hernia 01/31/2014   Small, noted on Barium  . Hyperlipidemia   . Hypertension   . Jaundice   . Low back pain   . MRSA (methicillin resistant Staphylococcus aureus)   . Neuropathy   . Nocturia   . OA (osteoarthritis)   . Osteoporosis   . Ovarian cyst   . PAD (peripheral artery disease) ([place])   . Peripheral vascular disease ([place])   . Pleural effusion, left 04/17/2017   Small  . RA (rheumatoid arthritis) ([place])   . Spinal stenosis   . Toe ulcer, right, with necrosis of bone ([place]) 08/25/2018  . Type 2 diabetes mellitus ([place])   . Unsteady gait   . Urinary incontinence   . Uterine polyp   . Varicose vein of leg    Bilateral   Past Surgical History:  Past Surgical History:  Procedure Laterality Date  . AMPUTATION Right 02/04/2013   Procedure: [name] ;  Surgeon: Wylene Simmer, MD;  Location: [facility];  Service: Orthopedics;  Laterality: Right;  . AMPUTATION TOE Right 09/03/2018   Procedure: AMPUTATION 3RD RIGHT TOE INTERPHALANGEAL;  Surgeon: [name], DPM;  Location: [facility];  Service: Podiatry;  Laterality: Right;  .  APPENDECTOMY    . COLONOSCOPY    . EYE SURGERY     both cataracts  . FEMORAL ARTERY EXPLORATION Right 04/12/2017   Procedure: EVACUATION HEMATOMA RIGHT GROIN WITH FEMORAL ARTERY REPAIR;  Surgeon: Rosetta Posner, MD;  Location: [facility];  Service: Vascular;  Laterality: Right;  . INCISION AND DRAINAGE  2011   left hip inf-hemovac  . IR ANGIOGRAM EXTREMITY LEFT  03/25/2017  . IR ANGIOGRAM EXTREMITY LEFT  04/08/2017  . IR ANGIOGRAM SELECTIVE EACH ADDITIONAL VESSEL  03/25/2017  . IR FEM POP ART STENT INC PTA MOD SED  03/25/2017  . IR INFUSION THROMBOL ARTERIAL  INITIAL (MS)  04/08/2017  . IR RADIOLOGIST EVAL & MGMT  02/26/2017  . IR RADIOLOGIST EVAL & MGMT  04/03/2017  . IR RADIOLOGIST EVAL & MGMT  08/20/2017  . IR RADIOLOGIST EVAL & MGMT  01/22/2018  . IR RADIOLOGIST EVAL & MGMT  01/13/2019  . IR THROMB F/U EVAL ART/VEN FINAL DAY (MS)  04/09/2017  . IR TIB-PERO ART PTA MOD SED  03/25/2017  . IR US GUIDE VASC ACCESS RIGHT  03/25/2017  . IR US GUIDE VASC ACCESS RIGHT  04/08/2017  . KNEE ARTHROSCOPY  1995   left  . NECK MASS EXCISION    . TONSILLECTOMY    . TOTAL HIP ARTHROPLASTY  2006   left-fx  . TOTAL KNEE ARTHROPLASTY  [id number]   left  . TOTAL KNEE ARTHROPLASTY  2009   right    Assessment / Plan / Recommendation Clinical Impression [name] is an 85 year old right-handed female presented with left-sided weakness and slurred speech that has been transient over 2 days. MRI of the brain showed small foci of acute early subacute ischemia within the posterior right temporal lobe within the PCA territory  PMH significant for PAF on Coumadin, PVD s/p left SFA angioplasty and stenting, DM2, HTN, HLD.  Son reports pt exhibited "the beginnings of dementia" prior to admit. Son and his wife assisted with reminders and medications. Pt was managing finances, driving, doing some cooking, living independently with her husband. Pt has a 12th grade education. Initial evaluation in acute care 06/27/2019 revealed baseline performance. Son reports that pt has demonstrated a significant decline in status since that time.  Bedside swallow Evaluation was completed to assess pt oral motor strength and function, and to identify least restrictive diet. Pt presents with natural dentition, and adequate oral motor strength and function. Pt accepted trials of thin liquid and solid textures. Extended oral prep was noted with solids, likely due to poor alertness. Pt has demonstrated poor intake recently. Will downgrade diet to dys 2 (finely chopped) primarily for energy  conservation. ST will follow to assess diet tolerance and readiness to advance diet, as well as continue education.  The [place] Mental Status (SLUMS) Examination was administered to evaluate pt's cognitive linguistic function. Pt scored 9/30, indicating marked neurocognitive deficits. Pt required ongoing verbal and tactile stimulation to maintain attention and alertness during this assessment.  Pt exhibited dfficulty with immediate and delayed recall, mental math, thought organization, digit repetition, clock drawing, and auditory retention. These deficits, while possibly exacerbated by poor alertness, raise concern for pt ability to return to prior level of function.   Continued skilled ST intervention is recommended during inpatient rehab stay for dysphagia treatment, and to maximize cognitive linguistics, decrease caregiver burden, and improve pt safety for return to home environment. Given son's report of decline in cognition prior to admit, 24 hour supervision may  be beneficial upon DC from CIR.    Skilled Therapeutic Interventions          Pt was seen at bedside for cognitive linguistic evaluation, as well as swallow evaluation.  SLP Assessment  Patient will need skilled Speech Language Pathology Services during CIR admission    Recommendations  SLP Diet Recommendations: Thin;Dysphagia 2 (Fine chop) Liquid Administration via: Cup;Straw Medication Administration: Whole meds with liquid Supervision: Patient able to self feed;Staff to assist with self feeding;Full supervision/cueing for compensatory strategies Compensations: Minimize environmental distractions;Slow rate;Small sips/bites Postural Changes and/or Swallow Maneuvers: Seated upright 90 degrees;Upright 30-60 min after meal Oral Care Recommendations: Oral care BID Patient destination: Home Follow up Recommendations: 24 hour supervision/assistance;Home Health SLP;Outpatient SLP Equipment Recommended: None recommended by  SLP    SLP Frequency 3 to 5 out of 7 days   SLP Duration  SLP Intensity  SLP Treatment/Interventions 2-2.5 weeks  Minumum of 1-2 x/day, 30 to 90 minutes  Cognitive remediation/compensation;Internal/external aids;Environmental controls;Multimodal communication approach;Dysphagia/aspiration precaution training;Functional tasks;Medication managment;Patient/family education;Therapeutic Activities    Pain Pain Assessment Pain Scale: Faces Pain Score: 5  Faces Pain Scale: Hurts whole lot Pain Type: Acute pain Pain Location: Neck Pain Orientation: Posterior Pain Descriptors / Indicators: Aching Pain Onset: On-going Patients Stated Pain Goal: 0 Pain Intervention(s): Distraction;Repositioned;Heat applied  Prior Functioning Cognitive/Linguistic Baseline: Baseline deficits Baseline deficit details: Son, present today, reports beginning of dementia Type of Home: House  Lives With: Spouse Available Help at Discharge: Family;Available 24 hours/day Education: 12th grade Vocation: Retired  Industrial/product designer Term Goals: Week 1: SLP Short Term Goal 1 (Week 1): Pt will tolerate current (dys2/thin) diet without overt s/s aspiration or decline in respiratory status SLP Short Term Goal 2 (Week 1): Pt will consistently demonstrate orientation x4 with mod A multimodal cues SLP Short Term Goal 3 (Week 1): Pt will sustain attention to functional task for 5 minutes with modA multimodal cues SLP Short Term Goal 4 (Week 1): Pt will demonstrate functional recall of daily events with modA multimodal cues SLP Short Term Goal 5 (Week 1): Pt will complete simple mental math problems with modA multimodal cues  Refer to Care Plan for Long Term Goals  Recommendations for other services: None   Discharge Criteria: Patient will be discharged from SLP if patient refuses treatment 3 consecutive times without medical reason, if treatment goals not met, if there is a change in medical status, if patient makes no progress  towards goals or if patient is discharged from hospital.  The above assessment, treatment plan, treatment alternatives and goals were discussed and mutually agreed upon: by patient and by family    B. Quentin Ore, [facility], [place] Speech Language Pathologist  Shonna Chock 06/30/2019, 4:54 PM

## 2019-06-30 NOTE — Evaluation (Signed)
Occupational Therapy Assessment and Plan  Patient Details  Name: [name] MRN: [id number] Date of Birth: December 21, 1927  OT Diagnosis: cognitive deficits, hemiplegia affecting non-dominant side and muscle weakness (generalized) Rehab Potential: Rehab Potential (ACUTE ONLY): Good ELOS: 2-3 weeks   Today's Date: 06/30/2019 OT Individual Time: 1000-1100 OT Individual Time Calculation (min): 60 min     Problem List:  Patient Active Problem List   Diagnosis Date Noted  . Leukocytosis   . Controlled type 2 diabetes mellitus with hyperglycemia, without long-term current use of insulin ([place])   . Labile blood glucose   . PAF (paroxysmal atrial fibrillation) ([place])   . 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[place]) 06/28/2019  . CVA (cerebral vascular accident) ([place]) 06/27/2019  . TIA (transient ischemic attack) 06/26/2019  . Hyperlipidemia   . Type 2 diabetes mellitus ([place])   . Degeneration of lumbar intervertebral disc 12/11/2017  . Cough   . Hematoma   . Groin swelling 04/12/2017  . Hematoma of groin 04/12/2017  . Critical lower limb ischemia 04/08/2017  . Long term (current) use of anticoagulants [Z79.01] 12/13/2015  . Closed dislocation of tarsometatarsal joint 10/05/2015  . ULCER OF OTHER PART OF FOOT 06/06/2010  . Essential hypertension 03/26/2010  . ATRIAL FIBRILLATION 03/26/2010  . CUTANEOUS ERUPTIONS, DRUG-INDUCED 03/26/2010  . DIARRHEA 03/26/2010  . GERD 03/20/2010  . UNSPEC LOCAL INFECTION SKIN&SUBCUTANEOUS TISSUE 03/20/2010  . OSTEOARTHRITIS 03/20/2010  . ARTHRITIS, SEPTIC 03/14/2010  . OTH COMPLICATIONS DUE INTERNAL JOINT PROSTHESIS 02/22/2010    Past Medical History:  Past Medical History:  Diagnosis Date  . Acute osteomyelitis ([place]) 08/25/2018   Right toe  . Anemia   . Atrial fibrillation ([name])   . Carpal tunnel syndrome, bilateral   . Cellulitis 08/25/2018   Right foot  . Cervical lymphadenopathy   . Compression fracture of fifth lumbar vertebra ([facility])    to T 11   . DDD (degenerative disc disease), lumbar   . DJD (degenerative joint disease)   . DOE (dyspnea on exertion)   . Dysphagia   . Dyspnea    with exertion  . Dysrhythmia    hx brief AF post op hip surg  . Esophageal dysmotility 01/31/2014   noted barium  . GERD (gastroesophageal reflux disease)   . Hematoma    right side after stent placement  . History of hiatal hernia 01/31/2014   Small, noted on Barium  . Hyperlipidemia   . Hypertension   . Jaundice   . Low back pain   . MRSA (methicillin resistant Staphylococcus aureus)   . Neuropathy   . Nocturia   . OA (osteoarthritis)   . Osteoporosis   . Ovarian cyst   . PAD (peripheral artery disease) ([place])   . Peripheral vascular disease ([place])   . Pleural effusion, left 04/17/2017   Small  . RA (rheumatoid arthritis) ([place])   . Spinal stenosis   . Toe ulcer, right, with necrosis of bone ([place]) 08/25/2018  . Type 2 diabetes mellitus ([place])   . Unsteady gait   . Urinary incontinence   . Uterine polyp   . Varicose vein of leg    Bilateral   Past Surgical History:  Past Surgical History:  Procedure Laterality Date  . AMPUTATION Right 02/04/2013   Procedure: [name] ;  Surgeon: Wylene Simmer, MD;  Location: [facility];  Service: Orthopedics;  Laterality: Right;  . AMPUTATION TOE Right 09/03/2018   Procedure: AMPUTATION 3RD RIGHT TOE INTERPHALANGEAL;  Surgeon: Daylene Katayama  M, DPM;  Location: [facility];  Service: Podiatry;  Laterality: Right;  . APPENDECTOMY    . COLONOSCOPY    . EYE SURGERY     both cataracts  . FEMORAL ARTERY EXPLORATION Right 04/12/2017   Procedure: EVACUATION HEMATOMA RIGHT GROIN WITH FEMORAL ARTERY REPAIR;  Surgeon: Rosetta Posner, MD;  Location: [facility];  Service: Vascular;  Laterality: Right;  . INCISION AND DRAINAGE  2011   left hip inf-hemovac  . IR ANGIOGRAM EXTREMITY LEFT  03/25/2017  . IR ANGIOGRAM EXTREMITY LEFT  04/08/2017  . IR ANGIOGRAM SELECTIVE EACH ADDITIONAL VESSEL   03/25/2017  . IR FEM POP ART STENT INC PTA MOD SED  03/25/2017  . IR INFUSION THROMBOL ARTERIAL INITIAL (MS)  04/08/2017  . IR RADIOLOGIST EVAL & MGMT  02/26/2017  . IR RADIOLOGIST EVAL & MGMT  04/03/2017  . IR RADIOLOGIST EVAL & MGMT  08/20/2017  . IR RADIOLOGIST EVAL & MGMT  01/22/2018  . IR RADIOLOGIST EVAL & MGMT  01/13/2019  . IR THROMB F/U EVAL ART/VEN FINAL DAY (MS)  04/09/2017  . IR TIB-PERO ART PTA MOD SED  03/25/2017  . IR US GUIDE VASC ACCESS RIGHT  03/25/2017  . IR US GUIDE VASC ACCESS RIGHT  04/08/2017  . KNEE ARTHROSCOPY  1995   left  . NECK MASS EXCISION    . TONSILLECTOMY    . TOTAL HIP ARTHROPLASTY  2006   left-fx  . TOTAL KNEE ARTHROPLASTY  [id number]   left  . TOTAL KNEE ARTHROPLASTY  2009   right    Assessment & Plan Clinical Impression: [name]. Pall is an 85 year old right-handed female with history of PAF maintained on Coumadin, peripheral vascular disease status post left SFA angioplasty and stenting, type 2 diabetes mellitus, hypertension, hyperlipidemia. Per chart review patient lives with spouse. 1 level home with ramped entrance. Ambulates with a rolling walker. She was performing her own ADLs modified independent. Presented 06/26/2019 with left-sided weakness and slurred speech that has been transient over 2 days. No reports of chest pain or shortness of breath. Blood pressure in the ED 143/95-213/112. INR on admission of 2.00, COVID negative. Cranial CT scan negative for acute changes. Patient did not receive TPA. MRI of the brain showed small foci of acute early subacute ischemia within the posterior right temporal lobe within the PCA territory. MR angiogram showed short segment occlusion of both posterior cerebral arteries, the P2-3 junction on the right and proximal P2 segment on the left. Short segment occlusion of the right MCA M1 segment just proximal to the bifurcation with normal M2 branches. Carotid Dopplers with no significant ICA stenosis. Echocardiogram  with ejection fraction of 25% normal systolic function. Neurology consulted Coumadin transition to Eliquis as well as remaining on aspirin therapy. Tolerating a regular consistency diet. Appetite has been limited started on gentle IV fluids. Therapy evaluations completed and patient was admitted for a comprehensive rehab program Patient transferred to CIR on 06/28/2019 .    Patient currently requires max with basic self-care skills secondary to muscle weakness, decreased cardiorespiratoy endurance, decreased coordination and decreased motor planning, decreased visual acuity, decreased attention to left, decreased initiation, decreased attention, decreased awareness, decreased problem solving, decreased safety awareness, decreased memory and delayed processing and decreased sitting balance, decreased standing balance, decreased postural control, hemiplegia and decreased balance strategies.  Prior to hospitalization, patient could complete ADLs with modified independent .  Patient will benefit from skilled intervention to decrease level of assist with basic self-care skills prior to  discharge home with care partner.  Anticipate patient will require 24 hour supervision and minimal physical assistance and follow up home health.  OT - End of Session Activity Tolerance: Tolerates < 10 min activity, no significant change in vital signs Endurance Deficit: Yes Endurance Deficit Description: generalized weakness OT Assessment Rehab Potential (ACUTE ONLY): Good OT Barriers to Discharge: Medical stability OT Barriers to Discharge Comments: pt with questionable change in medical status, potentially impacting current cognition OT Patient demonstrates impairments in the following area(s): Balance;Safety;Perception;Cognition;Endurance;Vision;Motor;Pain OT Basic ADL's Functional Problem(s): Bathing;Dressing;Toileting OT Transfers Functional Problem(s): Toilet;Tub/Shower OT Additional Impairment(s): None OT  Plan OT Intensity: Minimum of 1-2 x/day, 45 to 90 minutes OT Frequency: 5 out of 7 days OT Duration/Estimated Length of Stay: 2-3 weeks OT Treatment/Interventions: Balance/vestibular training;Discharge planning;Pain management;Self Care/advanced ADL retraining;UE/LE Coordination activities;Therapeutic Activities;Cognitive remediation/compensation;Disease mangement/prevention;Functional mobility training;Patient/family education;Therapeutic Exercise;Visual/perceptual remediation/compensation;Wheelchair propulsion/positioning;UE/LE Strength taining/ROM;Psychosocial support;Neuromuscular re-education;DME/adaptive equipment instruction;Community reintegration OT Self Feeding Anticipated Outcome(s): no goal set OT Basic Self-Care Anticipated Outcome(s): min A OT Toileting Anticipated Outcome(s): min A OT Bathroom Transfers Anticipated Outcome(s): min A OT Recommendation Recommendations for Other Services: Speech consult Patient destination: Home Follow Up Recommendations: Home health OT Equipment Recommended: To be determined   Skilled Therapeutic Intervention Skilled OT evaluation completed. Pt's son present throughout session and providing much of the history information. Pt is limited by cognitive deficits, generalized weakness, and a mild L hemi. Pt requires max A for ADLs and transfers at this time, but suspect pt is limited by lethargy of yesterdays events (susupected seizure activity). See below for ADLs performance. Pt will benefit from 2-3 weeks of skilled OT services. Pt was left supine with all needs met, bed alarm set.   OT Evaluation Precautions/Restrictions  Precautions Precautions: Fall Restrictions Weight Bearing Restrictions: No General Chart Reviewed: Yes Family/Caregiver Present: Yes Pain Pain Assessment Pain Scale: 0-10 Pain Score: 5  Pain Type: Acute pain Pain Location: Neck Pain Orientation: Posterior;Mid Pain Descriptors / Indicators: Aching;Sore Pain Onset:  On-going Home Living/Prior Functioning Home Living Family/patient expects to be discharged to:: Private residence Living Arrangements: Children Available Help at Discharge: Family Type of Home: House Home Access: Ramped entrance Home Layout: One level Bathroom Shower/Tub: Chiropodist: Standard Additional Comments: Son states that spouse provide minimal assistance and the rest of the family can assist her at discharge  Lives With: Spouse IADL History Homemaking Responsibilities: Yes Meal Prep Responsibility: Secondary Laundry Responsibility: Secondary Cleaning Responsibility: Secondary Bill Paying/Finance Responsibility: Secondary Shopping Responsibility: Secondary Prior Function Level of Independence: Independent with basic ADLs, Requires assistive device for independence Vocation: Retired ADL ADL Eating: Set up, Supervision/safety Where Assessed-Eating: Bed level Grooming: Contact guard Where Assessed-Grooming: Edge of bed Upper Body Bathing: Minimal assistance Where Assessed-Upper Body Bathing: Edge of bed Lower Body Bathing: Maximal assistance Where Assessed-Lower Body Bathing: Edge of bed Upper Body Dressing: Moderate assistance Where Assessed-Upper Body Dressing: Edge of bed Lower Body Dressing: Maximal assistance Where Assessed-Lower Body Dressing: Edge of bed Toileting: Maximal assistance Where Assessed-Toileting: Bedside Commode Toilet Transfer: Maximal assistance Toilet Transfer Method: Stand pivot Science writer: Extra wide bedside commode Vision Baseline Vision/History: Wears glasses Wears Glasses: Reading only Patient Visual Report: (Pt stated "it has gotten a little worse" but was unable to give accurate description of CLOF) Vision Assessment?: Vision impaired- to be further tested in functional context Additional Comments: Pt unable to hold eyes open for long enough for visual assessment Perception  Perception:  Impaired Comments: To be further tested Praxis Praxis: Impaired Praxis Impairment  Details: Perseveration;Initiation;Motor planning Cognition Overall Cognitive Status: Impaired/Different from baseline Arousal/Alertness: Awake/alert Orientation Level: Person;Place;Situation Person: Oriented Place: Oriented Situation: Oriented Year: 2020 Month: August Day of Week: Correct Memory: Impaired Memory Impairment: Decreased short term memory Decreased Short Term Memory: Verbal basic Immediate Memory Recall: Sock;Blue;Bed Memory Recall Sock: Not able to recall Memory Recall Blue: Without Cue Memory Recall Bed: (not able to recall) Attention: Focused Focused Attention: Impaired Focused Attention Impairment: Functional basic Awareness: Impaired Awareness Impairment: Emergent impairment Problem Solving: Impaired Problem Solving Impairment: Verbal basic Executive Function: Initiating Initiating: Impaired Initiating Impairment: Functional basic Safety/Judgment: Impaired Comments: Pt's cognition/lethargy limited much of the eval Sensation Sensation Light Touch: Appears Intact Proprioception: Impaired by gross assessment Coordination Gross Motor Movements are Fluid and Coordinated: No Fine Motor Movements are Fluid and Coordinated: No Coordination and Movement Description: generalized movement, very mild L hemi Finger Nose Finger Test: undershoots, slow Motor  Motor Motor: Other (comment);Hemiplegia Motor - Skilled Clinical Observations: generalized weakness, mild L hemi Mobility  Bed Mobility Bed Mobility: Supine to Sit Supine to Sit: Moderate Assistance - Patient 50-74% Transfers Sit to Stand: Maximal Assistance - Patient 25-49% Stand to Sit: Moderate Assistance - Patient 50-74%  Trunk/Postural Assessment  Cervical Assessment Cervical Assessment: Exceptions to WFL(forward head) Thoracic Assessment Thoracic Assessment: Exceptions to WFL(kyphotic posture) Lumbar  Assessment Lumbar Assessment: Exceptions to WFL(posterior pelvic tilt in sitting and standing) Postural Control Postural Control: Deficits on evaluation Righting Reactions: delayed  Balance Balance Balance Assessed: Yes Static Sitting Balance Static Sitting - Balance Support: Feet supported Static Sitting - Level of Assistance: 4: Min assist Dynamic Sitting Balance Dynamic Sitting - Balance Support: Feet supported Dynamic Sitting - Level of Assistance: 4: Min assist Dynamic Sitting - Balance Activities: Reaching for objects Static Standing Balance Static Standing - Balance Support: During functional activity;Bilateral upper extremity supported Static Standing - Level of Assistance: 3: Mod assist Dynamic Standing Balance Dynamic Standing - Balance Support: During functional activity;Bilateral upper extremity supported Dynamic Standing - Level of Assistance: 2: Max assist Dynamic Standing - Comments: Peri hygiene following toileting Extremity/Trunk Assessment RUE Assessment RUE Assessment: Exceptions to [facility] General Strength Comments: 3/5 MMT LUE Assessment LUE Assessment: Exceptions to [facility] General Strength Comments: 3/5 MMT     Refer to Care Plan for Long Term Goals  Recommendations for other services: None    Discharge Criteria: Patient will be discharged from OT if patient refuses treatment 3 consecutive times without medical reason, if treatment goals not met, if there is a change in medical status, if patient makes no progress towards goals or if patient is discharged from hospital.  The above assessment, treatment plan, treatment alternatives and goals were discussed and mutually agreed upon: by patient and by family  Curtis Sites 06/30/2019, 12:44 PM

## 2019-06-30 NOTE — Progress Notes (Signed)
STROKE TEAM PROGRESS NOTE   SUBJECTIVE (INTERVAL HISTORY) Her son is at the bedside.  Overall her condition is gradually improving. Son stated that pt seems stable today, no such episodes like yesterday. She worked with OT this am and was doing well. And then she was worn out and back in bed. No PT yet.    OBJECTIVE Temp:  [97.7 F (36.5 C)-98 F (36.7 C)] 97.7 F (36.5 C) (08/12 0527) Pulse Rate:  [88-115] 115 (08/12 0527) Resp:  [12-18] 12 (08/12 0527) BP: (155-157)/(95-112) 157/112 (08/12 0527) SpO2:  [99 %-100 %] 100 % (08/12 0527)  Recent Labs  Lab 06/29/19 1205 06/29/19 1710 06/29/19 2201 06/30/19 0611 06/30/19 1205  GLUCAP 170* 89 171* 132* 240*   Recent Labs  Lab 06/26/19 1958 06/28/19 0636 06/29/19 0551  NA 140 140 140  K 4.5 4.0 3.7  CL 104 105 106  CO2 26 17* 22  GLUCOSE 148* 211* 160*  BUN 21 31* 28*  CREATININE 0.77 1.13* 0.86  CALCIUM 9.9 9.1 8.9   Recent Labs  Lab 06/26/19 1958 06/29/19 0551  AST 21 23  ALT 20 20  ALKPHOS 66 56  BILITOT 0.9 1.8*  PROT 7.4 6.3*  ALBUMIN 4.1 3.6   Recent Labs  Lab 06/26/19 1958 06/28/19 0636 06/29/19 0551  WBC 8.1 14.1* 10.8*  NEUTROABS 5.8  --  8.1*  HGB 17.6* 18.3* 17.5*  HCT 53.2* 53.3* 51.1*  MCV 97.1 94.7 95.0  PLT 197 225 205   No results for input(s): CKTOTAL, CKMB, CKMBINDEX, TROPONINI in the last 168 hours. No results for input(s): LABPROT, INR in the last 72 hours. No results for input(s): COLORURINE, LABSPEC, [place], GLUCOSEU, HGBUR, BILIRUBINUR, KETONESUR, PROTEINUR, UROBILINOGEN, NITRITE, LEUKOCYTESUR in the last 72 hours.  Invalid input(s): APPERANCEUR     Component Value Date/Time   CHOL 187 06/27/2019 0411   TRIG 170 (H) 06/27/2019 0411   HDL 27 (L) 06/27/2019 0411   CHOLHDL 6.9 06/27/2019 0411   VLDL 34 06/27/2019 0411   LDLCALC 126 (H) 06/27/2019 0411   Lab Results  Component Value Date   HGBA1C 7.4 (H) 06/27/2019   No results found for: LABOPIA, COCAINSCRNUR, LABBENZ,  AMPHETMU, THCU, LABBARB  No results for input(s): ETH in the last 168 hours.  I have personally reviewed the radiological images below and agree with the radiology interpretations.  Ct Head Wo Contrast  Result Date: 06/29/2019 CLINICAL DATA:  Altered mental status with unclear cause EXAM: CT HEAD WITHOUT CONTRAST TECHNIQUE: Contiguous axial images were obtained from the base of the skull through the vertex without intravenous contrast. COMPARISON:  Brain MRI from 2 days ago FINDINGS: Brain: No evidence of acute infarction, hemorrhage, hydrocephalus, extra-axial collection or mass lesion/mass effect. Chronic lacune in the left frontal white matter above the frontal horn. Age normal cerebral volume. Vascular: No hyperdense vessel or unexpected calcification. Skull: Osteopenic appearance.  No acute finding or lesion. Sinuses/Orbits: Opacified right frontal sinus that is stable and incidental to the history IMPRESSION: Stable exam.  No acute or reversible finding. Electronically Signed   By: Monte Fantasia M.D.   On: 06/29/2019 11:40   Ct Head Wo Contrast  Result Date: 06/26/2019 CLINICAL DATA:  Tingling in left hand. EXAM: CT HEAD WITHOUT CONTRAST TECHNIQUE: Contiguous axial images were obtained from the base of the skull through the vertex without intravenous contrast. COMPARISON:  October 13, 2007 FINDINGS: Brain: No subdural, epidural, or subarachnoid hemorrhage. Ventricles and sulci are unremarkable. Cerebellum, brainstem, and basal cisterns are  normal. Mild white matter changes identified. No acute cortical ischemia or infarct is noted. No mass effect or midline shift is identified. Vascular: Calcified atherosclerosis is seen in the intracranial carotids. Skull: Normal. Negative for fracture or focal lesion. Sinuses/Orbits: Opacification of the right frontal sinus is identified. Paranasal sinuses, mastoid air cells, and middle ears are otherwise normal. Other: None. IMPRESSION: 1. No acute intracranial  abnormalities are identified. Electronically Signed   By: Dorise Bullion III M.D   On: 06/26/2019 19:55   Mr Angio Head Wo Contrast  Result Date: 06/27/2019 CLINICAL DATA:  Left-sided facial droop and slurred speech with left hand weakness. Three episodes in last 2 days. EXAM: MRI HEAD WITHOUT CONTRAST MRA HEAD WITHOUT CONTRAST TECHNIQUE: Multiplanar, multiecho pulse sequences of the brain and surrounding structures were obtained without intravenous contrast. Angiographic images of the head were obtained using MRA technique without contrast. COMPARISON:  Head CT 06/26/2019 Brain MRI 09/24/2009 FINDINGS: MRI HEAD FINDINGS BRAIN: There are 2-3 small areas of abnormal diffusion restriction within the posterior right temporal lobe (series 5, image 63). The white matter signal is normal for the patient's age. The cerebral and cerebellar volume are age-appropriate. There is no hydrocephalus. The midline structures are normal. VASCULAR: There are 2 scattered foci of chronic microhemorrhage in the supratentorial brain. The major intracranial arterial and venous sinus flow voids are normal. SKULL AND UPPER CERVICAL SPINE: Calvarial bone marrow signal is normal. There is no skull base mass. The visualized upper cervical spine and soft tissues are normal. SINUSES/ORBITS: There are no fluid levels or advanced mucosal thickening. The mastoid air cells and middle ear cavities are free of fluid. The orbits are normal. MRA HEAD FINDINGS POSTERIOR CIRCULATION: --Vertebral arteries: Normal V4 segments. --Posterior inferior cerebellar arteries (PICA): The left PICA and AICA share a common origin. Normal origin of right PICA from the ipsilateral vertebral artery. --Anterior inferior cerebellar arteries (AICA): Patent origins from the basilar artery. --Basilar artery: Normal. --Superior cerebellar arteries: Normal. --Posterior cerebral arteries (PCA): The right PCA is occluded at the P2-3 junction with distal reconstitution. The  left PCA is occluded as short segment of the proximal P2 segment. The distal left PCA is patent. ANTERIOR CIRCULATION: --Intracranial internal carotid arteries: Normal. --Anterior cerebral arteries (ACA): Multifocal stenosis of the left anterior cerebral artery with areas of intermittent loss of normal flow related enhancement. --Middle cerebral arteries (MCA): Short segment loss of enhancement of the right middle cerebral artery distal M1 segment. The M2 branches are normal. The left M1 segment is normal. There is moderate stenosis of the left M2 inferior division. IMPRESSION: 1. 2-3 small foci of acute to early subacute ischemia within the posterior right temporal lobe, within the PCA territory. 2. 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 3. Short segment occlusion of the right MCA M1 segment, just proximal to the bifurcation, with normal M2 branches. 4. Moderate stenosis of the left M2 inferior division. Electronically Signed   By: Ulyses Jarred M.D.   On: 06/27/2019 00:42   Mr Brain Wo Contrast  Result Date: 06/27/2019 CLINICAL DATA:  Left-sided facial droop and slurred speech with left hand weakness. Three episodes in last 2 days. EXAM: MRI HEAD WITHOUT CONTRAST MRA HEAD WITHOUT CONTRAST TECHNIQUE: Multiplanar, multiecho pulse sequences of the brain and surrounding structures were obtained without intravenous contrast. Angiographic images of the head were obtained using MRA technique without contrast. COMPARISON:  Head CT 06/26/2019 Brain  MRI 09/24/2009 FINDINGS: MRI HEAD FINDINGS BRAIN: There are 2-3 small areas of abnormal diffusion restriction within the posterior right temporal lobe (series 5, image 63). The white matter signal is normal for the patient's age. The cerebral and cerebellar volume are age-appropriate. There is no hydrocephalus. The midline structures are normal. VASCULAR: There are 2 scattered foci of  chronic microhemorrhage in the supratentorial brain. The major intracranial arterial and venous sinus flow voids are normal. SKULL AND UPPER CERVICAL SPINE: Calvarial bone marrow signal is normal. There is no skull base mass. The visualized upper cervical spine and soft tissues are normal. SINUSES/ORBITS: There are no fluid levels or advanced mucosal thickening. The mastoid air cells and middle ear cavities are free of fluid. The orbits are normal. MRA HEAD FINDINGS POSTERIOR CIRCULATION: --Vertebral arteries: Normal V4 segments. --Posterior inferior cerebellar arteries (PICA): The left PICA and AICA share a common origin. Normal origin of right PICA from the ipsilateral vertebral artery. --Anterior inferior cerebellar arteries (AICA): Patent origins from the basilar artery. --Basilar artery: Normal. --Superior cerebellar arteries: Normal. --Posterior cerebral arteries (PCA): The right PCA is occluded at the P2-3 junction with distal reconstitution. The left PCA is occluded as short segment of the proximal P2 segment. The distal left PCA is patent. ANTERIOR CIRCULATION: --Intracranial internal carotid arteries: Normal. --Anterior cerebral arteries (ACA): Multifocal stenosis of the left anterior cerebral artery with areas of intermittent loss of normal flow related enhancement. --Middle cerebral arteries (MCA): Short segment loss of enhancement of the right middle cerebral artery distal M1 segment. The M2 branches are normal. The left M1 segment is normal. There is moderate stenosis of the left M2 inferior division. IMPRESSION: 1. 2-3 small foci of acute to early subacute ischemia within the posterior right temporal lobe, within the PCA territory. 2. 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 3. Short segment occlusion of the right MCA M1 segment, just proximal to the bifurcation, with normal M2 branches. 4. Moderate stenosis of  the left M2 inferior division. Electronically Signed   By: Ulyses Jarred M.D.   On: 06/27/2019 00:42   Vas US Carotid (at [place] Only)  Result Date: 06/27/2019 Carotid Arterial Duplex Study Indications:       TIA. Risk Factors:      Hypertension, hyperlipidemia, Diabetes, PAD. Comparison Study:  no prior Performing Technologist: Abram Sander RVS  Examination Guidelines: A complete evaluation includes B-mode imaging, spectral Doppler, color Doppler, and power Doppler as needed of all accessible portions of each vessel. Bilateral testing is considered an integral part of a complete examination. Limited examinations for reoccurring indications may be performed as noted.  Right Carotid Findings: +----------+--------+--------+--------+------------------------+--------+           PSV cm/sEDV cm/sStenosisDescribe                Comments +----------+--------+--------+--------+------------------------+--------+ CCA Prox  48      10              calcific and homogeneous         +----------+--------+--------+--------+------------------------+--------+ CCA Distal38      11              homogeneous                      +----------+--------+--------+--------+------------------------+--------+ ICA Prox  31      11      1-39%   homogeneous                      +----------+--------+--------+--------+------------------------+--------+  ICA Distal36      11                                               +----------+--------+--------+--------+------------------------+--------+ ECA       42                                                       +----------+--------+--------+--------+------------------------+--------+ +----------+--------+-------+--------+-------------------+           PSV cm/sEDV cmsDescribeArm Pressure (mmHG) +----------+--------+-------+--------+-------------------+ [id number]                                          +----------+--------+-------+--------+-------------------+ +---------+--------+--+--------+--+---------+ VertebralPSV cm/s42EDV cm/s13Antegrade +---------+--------+--+--------+--+---------+  Left Carotid Findings: +----------+--------+--------+--------+--------+--------+           PSV cm/sEDV cm/sStenosisDescribeComments +----------+--------+--------+--------+--------+--------+ CCA Prox  46      8               calcific         +----------+--------+--------+--------+--------+--------+ CCA Distal58      16              calcific         +----------+--------+--------+--------+--------+--------+ ICA Prox  59      13      1-39%   calcific         +----------+--------+--------+--------+--------+--------+ ICA Distal45      17                               +----------+--------+--------+--------+--------+--------+ ECA       74      11                               +----------+--------+--------+--------+--------+--------+ +----------+--------+--------+--------+-------------------+ SubclavianPSV cm/sEDV cm/sDescribeArm Pressure (mmHG) +----------+--------+--------+--------+-------------------+           66                                          +----------+--------+--------+--------+-------------------+ +---------+--------+--+--------+-+---------+ VertebralPSV cm/s30EDV cm/s8Antegrade +---------+--------+--+--------+-+---------+  Summary: Right Carotid: Velocities in the right ICA are consistent with a 1-39% stenosis. Left Carotid: Velocities in the left ICA are consistent with a 1-39% stenosis. Vertebrals: Bilateral vertebral arteries demonstrate antegrade flow. *See table(s) above for measurements and observations.  Electronically signed by Harold Barban MD on 06/27/2019 at 12:29:25 PM.    Final     PHYSICAL EXAM  Temp:  [97.7 F (36.5 C)-98 F (36.7 C)] 97.7 F (36.5 C) (08/12 0527) Pulse Rate:  [88-115] 115 (08/12 0527) Resp:  [12-18] 12 (08/12  0527) BP: (155-157)/(95-112) 157/112 (08/12 0527) SpO2:  [99 %-100 %] 100 % (08/12 0527)  General - Well nourished, well developed, lethargic.  Ophthalmologic - fundi not visualized due to noncooperation.  Cardiovascular - irregularly irregular heart rate and rhythm.  Neuro - lethargic, able to open eyes on voice, orientated to self, age, people, time and place.  Following simple commands,  paucity of speech, able to name 2/3 and able to repeat simple sentences.  PERRL, blinking to visual threat bilaterally, no significant gaze deviation, tracking bilaterally.  Left nasolabial fold flattening, tongue midline.  Bilateral upper extremity 3/5, bilateral lower extremity 3/5 proximal and distal.  Sensation symmetrical, coordination not corporative, gait not tested.  DTR 1+, no Babinski.   Assessment:  Ms. KEVONA LUPINACCI is a 85 y.o. female with history of posterior R temporal lobe infarct d/t AF w/ subtherapeutic INR on warfarin who was d/c to IP rehab where she developed 2 episode of altered mental status with R gaze preference and unresponsive state.  Concerning for seizure activity, treated with ativan and Keppra load.  EEG done showed no seizure.  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 No new finding.  Ongoing CIR therapy recommended  Atrial Fibrillation  Continue Eliquis (apixaban) 2.5 mg twice daily  Rate controlled  On metoprolol   Hypertension  BP 150-160s  On norvasc 5, toprol XL 25  BP goal normotensive  Hyperlipidemia  Continue Lipitor 40  LDL 126, goal <  70  Continue statin on discharge  Diabetes type II, Uncontrolled  HgbA1c 7.4, goal < 7.0  On SSI  CBG monitoring  Home meds: metformin  Other Stroke Risk Factors  Advanced age  Hx ETOH use  Other Active Problems  Cognitive decline  Hospital day # 2   Tiffany Hawking, MD PhD Stroke Neurology 06/30/2019 2:37 PM    To contact Stroke Continuity provider, please refer to [email or web address] After hours, contact General Neurology

## 2019-06-30 NOTE — Progress Notes (Signed)
Social Work Assessment and Plan   Patient Details  Name: [name] MRN: [id number] Date of Birth: 03-Jun-1928  Today's Date: 06/30/2019  Problem List:  Patient Active Problem List   Diagnosis Date Noted  . Leukocytosis   . Controlled type 2 diabetes mellitus with hyperglycemia, without long-term current use of insulin ([place])   . Labile blood glucose   . PAF (paroxysmal atrial fibrillation) ([place])   . 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[place]) 06/28/2019  . CVA (cerebral vascular accident) ([place]) 06/27/2019  . TIA (transient ischemic attack) 06/26/2019  . Hyperlipidemia   . Type 2 diabetes mellitus ([place])   . Degeneration of lumbar intervertebral disc 12/11/2017  . Cough   . Hematoma   . Groin swelling 04/12/2017  . Hematoma of groin 04/12/2017  . Critical lower limb ischemia 04/08/2017  . Long term (current) use of anticoagulants [Z79.01] 12/13/2015  . Closed dislocation of tarsometatarsal joint 10/05/2015  . ULCER OF OTHER PART OF FOOT 06/06/2010  . Essential hypertension 03/26/2010  . ATRIAL FIBRILLATION 03/26/2010  . CUTANEOUS ERUPTIONS, DRUG-INDUCED 03/26/2010  . DIARRHEA 03/26/2010  . GERD 03/20/2010  . UNSPEC LOCAL INFECTION SKIN&SUBCUTANEOUS TISSUE 03/20/2010  . OSTEOARTHRITIS 03/20/2010  . ARTHRITIS, SEPTIC 03/14/2010  . OTH COMPLICATIONS DUE INTERNAL JOINT PROSTHESIS 02/22/2010   Past Medical History:  Past Medical History:  Diagnosis Date  . Acute osteomyelitis ([place]) 08/25/2018   Right toe  . Anemia   . Atrial fibrillation ([place])   . Carpal tunnel syndrome, bilateral   . Cellulitis 08/25/2018   Right foot  . Cervical lymphadenopathy   . Compression fracture of fifth lumbar vertebra ([facility])    to T 11  . DDD (degenerative disc disease), lumbar   . DJD (degenerative joint disease)   . DOE (dyspnea on exertion)   . Dysphagia   . Dyspnea    with exertion  . Dysrhythmia    hx brief AF post op hip surg  . Esophageal dysmotility 01/31/2014   noted barium  . GERD  (gastroesophageal reflux disease)   . Hematoma    right side after stent placement  . History of hiatal hernia 01/31/2014   Small, noted on Barium  . Hyperlipidemia   . Hypertension   . Jaundice   . Low back pain   . MRSA (methicillin resistant Staphylococcus aureus)   . Neuropathy   . Nocturia   . OA (osteoarthritis)   . Osteoporosis   . Ovarian cyst   . PAD (peripheral artery disease) ([place])   . Peripheral vascular disease ([place])   . Pleural effusion, left 04/17/2017   Small  . RA (rheumatoid arthritis) ([place])   . Spinal stenosis   . Toe ulcer, right, with necrosis of bone ([place]) 08/25/2018  . Type 2 diabetes mellitus ([place])   . Unsteady gait   . Urinary incontinence   . Uterine polyp   . Varicose vein of leg    Bilateral   Past Surgical History:  Past Surgical History:  Procedure Laterality Date  . AMPUTATION Right 02/04/2013   Procedure: [name] ;  Surgeon: Wylene Simmer, MD;  Location: [facility];  Service: Orthopedics;  Laterality: Right;  . AMPUTATION TOE Right 09/03/2018   Procedure: AMPUTATION 3RD RIGHT TOE INTERPHALANGEAL;  Surgeon: [name], DPM;  Location: [facility];  Service: Podiatry;  Laterality: Right;  . APPENDECTOMY    . COLONOSCOPY    . EYE SURGERY     both cataracts  . FEMORAL ARTERY EXPLORATION Right  04/12/2017   Procedure: EVACUATION HEMATOMA RIGHT GROIN WITH FEMORAL ARTERY REPAIR;  Surgeon: Rosetta Posner, MD;  Location: [facility];  Service: Vascular;  Laterality: Right;  . INCISION AND DRAINAGE  2011   left hip inf-hemovac  . IR ANGIOGRAM EXTREMITY LEFT  03/25/2017  . IR ANGIOGRAM EXTREMITY LEFT  04/08/2017  . IR ANGIOGRAM SELECTIVE EACH ADDITIONAL VESSEL  03/25/2017  . IR FEM POP ART STENT INC PTA MOD SED  03/25/2017  . IR INFUSION THROMBOL ARTERIAL INITIAL (MS)  04/08/2017  . IR RADIOLOGIST EVAL & MGMT  02/26/2017  . IR RADIOLOGIST EVAL & MGMT  04/03/2017  . IR RADIOLOGIST EVAL & MGMT  08/20/2017  . IR RADIOLOGIST EVAL & MGMT   01/22/2018  . IR RADIOLOGIST EVAL & MGMT  01/13/2019  . IR THROMB F/U EVAL ART/VEN FINAL DAY (MS)  04/09/2017  . IR TIB-PERO ART PTA MOD SED  03/25/2017  . IR US GUIDE VASC ACCESS RIGHT  03/25/2017  . IR US GUIDE VASC ACCESS RIGHT  04/08/2017  . KNEE ARTHROSCOPY  1995   left  . NECK MASS EXCISION    . TONSILLECTOMY    . TOTAL HIP ARTHROPLASTY  2006   left-fx  . TOTAL KNEE ARTHROPLASTY  [id number]   left  . TOTAL KNEE ARTHROPLASTY  2009   right   Social History:  reports that she has never smoked. She has never used smokeless tobacco. She reports previous alcohol use. She reports that she does not use drugs.  Family / Support Systems Marital Status: Married How Long?: 40 years Patient Roles: Spouse, Parent, Other (Comment)(grandmother) Spouse/Significant Other: Izabel Chim - husband - [phone] (h); [phone] (m) Children: Xitlaly Ault - son/designated visitor - [phone]; Kinley Dozier (pt son Keith's wife) - [phone]; Adell Koval (Jeff's wife) - [phone] Other Supports: Annye Asa - son Anticipated Caregiver: pts spouse, and adult children Ability/Limitations of Caregiver: [name] states family is agreeable to 24/7 supervision if needed.  They are also looking into hiring help for the nighttime. Caregiver Availability: 24/7 Family Dynamics: close, supportive family  Social History Preferred language: English Religion: Methodist Read: Yes Write: Yes Employment Status: Retired Public relations account executive Issues: none reported Guardian/Conservator: MD has determined that pt is not currently capable of making her own decisions.  Pt's husband is next of kin and son is often at her bedside.   Abuse/Neglect Abuse/Neglect Assessment Can Be Completed: Unable to assess, patient is non-responsive or altered mental status(CSW will continue to follow pt to assess this.) Physical Abuse: Denies Verbal Abuse: Denies Sexual Abuse: Denies Exploitation of patient/patient's  resources: Denies Self-Neglect: Denies  Emotional Status Pt's affect, behavior and adjustment status: Pt was in and out of sleep during [place] visit, but when she was awake she was accurate in her answers to questions and was even smiling at times and a little funny. Recent Psychosocial Issues: none reported Psychiatric History: none reported Substance Abuse History: none reported  Patient / Family Perceptions, Expectations & Goals Pt/Family understanding of illness & functional limitations: Pt's son expresses a good understanding of pt's condition and limitations. 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  Pt and husband host Thanksgiving and Christmas meals every year. Anticipated changes in roles/activities/participation: Pt would like to resume above activities as she is able. Pt/family expectations/goals: Pt's son would like for pt to be 50% of where  she was before.  Community [employer] Agencies: None Premorbid Home Care/DME Agencies: Other (Comment)(Pt uses a rolling walker and family has redone her entire bathroom/shower to make it handicapped accessible.) Transportation available at discharge: family Resource referrals recommended: Neuropsychology, Support group (specify)(stroke support group)  Discharge Planning Living Arrangements: Spouse/significant other Support Systems: Children, Other relatives, Friends/neighbors Type of Residence: Private residence Insurance Resources: [employer], Multimedia programmer (specify)([employer] supplement) Museum/gallery curator Resources: Fish farm manager, Employment Financial Screen Referred: No Money Management: Patient Does the patient have any problems obtaining your medications?: No Home Management: Pt and her husband were doing most of this.  Sometimes the children will help and they hire people to help  clean the house at the holidays. Patient/Family Preliminary Plans: Pt plans to return home with her family to provide 24/7 care.  They may hire help for the nights. Social Work Anticipated Follow Up Needs: HH/OP, Support Group Expected length of stay: 2 to 3 years  Clinical Impression CSW met with pt and her son to introduce self and role of CSW, as well as to complete assessment.  Pt was lethargic and in and out of sleep during visit, but she did answer questions appropriately when she was alert.  Son is very supportive and states that the rest of the family is, as well. Son is pleased pt is on CIR and hopes she will get back to at least 50% of where she was prior to stroke and seizures.  No current concerns/needs/questions at this time, but CSW will continue to follow and assist as needed.  Effa Yarrow, Silvestre Mesi 06/30/2019, 11:44 PM

## 2019-06-30 NOTE — Progress Notes (Signed)
[facility] PHYSICAL MEDICINE & REHABILITATION PROGRESS NOTE   Subjective/Complaints: Patient seen sitting up in bed this morning.  No reported issues overnight.  Yesterday she was noted to have a period of unresponsiveness.  ROS: Limited due to cognition   Objective:   Ct Head Wo Contrast  Result Date: 06/29/2019 CLINICAL DATA:  Altered mental status with unclear cause EXAM: CT HEAD WITHOUT CONTRAST TECHNIQUE: Contiguous axial images were obtained from the base of the skull through the vertex without intravenous contrast. COMPARISON:  Brain MRI from 2 days ago FINDINGS: Brain: No evidence of acute infarction, hemorrhage, hydrocephalus, extra-axial collection or mass lesion/mass effect. Chronic lacune in the left frontal white matter above the frontal horn. Age normal cerebral volume. Vascular: No hyperdense vessel or unexpected calcification. Skull: Osteopenic appearance.  No acute finding or lesion. Sinuses/Orbits: Opacified right frontal sinus that is stable and incidental to the history IMPRESSION: Stable exam.  No acute or reversible finding. Electronically Signed   By: Monte Fantasia M.D.   On: 06/29/2019 11:40   Recent Labs    06/28/19 0636 06/29/19 0551  WBC 14.1* 10.8*  HGB 18.3* 17.5*  HCT 53.3* 51.1*  PLT 225 205   Recent Labs    06/28/19 0636 06/29/19 0551  NA 140 140  K 4.0 3.7  CL 105 106  CO2 17* 22  GLUCOSE 211* 160*  BUN 31* 28*  CREATININE 1.13* 0.86  CALCIUM 9.1 8.9    Intake/Output Summary (Last 24 hours) at 06/30/2019 1144 Last data filed at 06/30/2019 8338 Gross per 24 hour  Intake 646.44 ml  Output -  Net 646.44 ml     Physical Exam: Vital Signs Blood pressure (!) 157/112, pulse (!) 115, temperature 97.7 F (36.5 C), temperature source Oral, resp. rate 12, height 5\' 3"  (1.6 m), weight 50 kg, SpO2 100 %. Constitutional: No distress . Vital signs reviewed. HENT: Normocephalic.  Atraumatic. Eyes: EOMI. No discharge. Cardiovascular: No JVD.   Irregularly irregular. Respiratory: Normal effort.  No stridor. GI: Non-distended. Skin: Warm and dry.  Intact. Psych: Normal mood.  Normal behavior. Musc: No edema.  No tenderness. Neurological: Lethargic Left eye ptosis Motor:?  Limited due to participation RUE/RLE: Grossly 4-/5 LUE/LE: Grossly 4/5 Skin: Missing 3rd and 5th toes right foot and 3rd toe left foot.  Intact.   Psychiatric: Limited due to cognition  Assessment/Plan: 1. Functional deficits secondary to right temporal lobe/PCA distribution infarct which require 3+ hours per day of interdisciplinary therapy in a comprehensive inpatient rehab setting.  Physiatrist is providing close team supervision and 24 hour management of active medical problems listed below.  Physiatrist and rehab team continue to assess barriers to discharge/monitor patient progress toward functional and medical goals  Care Tool:  Bathing              Bathing assist       Upper Body Dressing/Undressing Upper body dressing        Upper body assist      Lower Body Dressing/Undressing Lower body dressing            Lower body assist       Toileting Toileting    Toileting assist Assist for toileting: Maximal Assistance - Patient 25 - 49%     Transfers Chair/bed transfer  Transfers assist  Chair/bed transfer activity did not occur: Safety/medical concerns        Locomotion Ambulation   Ambulation assist   Ambulation activity did not occur: Safety/medical concerns  Walk 10 feet activity   Assist  Walk 10 feet activity did not occur: Safety/medical concerns        Walk 50 feet activity   Assist Walk 50 feet with 2 turns activity did not occur: Safety/medical concerns         Walk 150 feet activity   Assist Walk 150 feet activity did not occur: Safety/medical concerns         Walk 10 feet on uneven surface  activity   Assist Walk 10 feet on uneven surfaces activity did not  occur: Safety/medical concerns         Wheelchair     Assist     Wheelchair activity did not occur: Safety/medical concerns         Wheelchair 50 feet with 2 turns activity    Assist    Wheelchair 50 feet with 2 turns activity did not occur: Safety/medical concerns       Wheelchair 150 feet activity     Assist Wheelchair 150 feet activity did not occur: Safety/medical concerns        Medical Problem List and Plan: 1.Left-sided weakness with slurred speechsecondary to acute early subacute ischemia within the posterior right temporal lobe within the PCA territory. Continue CIR  Repeat head CT stable  EEG negative for seizures  Appreciate neurology consult, continue antiepileptics 2. Antithrombotics: -DVT/anticoagulation:Eliquis -antiplatelet therapy: Aspirin 325 mg daily 3. Pain Management:Tylenol as needed 4. Mood:Provide emotional support -antipsychotic agents: N/A 5. Neuropsych: This patientisnot capable of making decisions on herown behalf. 6. Skin/Wound Care:Routine skin checks 7. Fluids/Electrolytes/Nutrition:encourage appropriate PO 8. PAF. Continue Eliquis  ECG reviewed, showing A. fib 9. Hypertension. Toprol 12.5 mg daily.  Increased Toprol to 25mg  bid   Remains elevated on 8/12    10. Diabetes mellitus with hyperglycemia. Hemoglobin A1c 7.4. SSI. Patient on Glucophage 500 mg twice daily prior to admission. Resume as needed based on trends  Labile on 8/12 11. Hyperlipidemia. Lipitor 12.Peripheral vascular disease with history of left SFA angioplasty and stenting in the past. Continue low-dose aspirin 13.Decreased nutritional storage. Dietary follow-up.  IV fluids. -protein supp 14.  Leukocytosis  WBCs 10.8 on 8/11  Continue to monitor   LOS: 2 days A FACE TO FACE EVALUATION WAS PERFORMED  [name] 06/30/2019, 11:44 AM

## 2019-06-30 NOTE — Progress Notes (Signed)
[facility] Individual Statement of Services  Patient Name:  MAERYN MCGATH  Date:  06/30/2019  Welcome to the [facility].  Our goal is to provide you with an individualized program based on your diagnosis and situation, designed to meet your specific needs.  With this comprehensive rehabilitation program, you will be expected to participate in at least 3 hours of rehabilitation therapies Monday-Friday, with modified therapy programming on the weekends.  Your rehabilitation program will include the following services:  Physical Therapy (PT), Occupational Therapy (OT), Speech Therapy (ST), 24 hour per day rehabilitation nursing, Neuropsychology, Case Management (Social Worker), Rehabilitation Medicine, Nutrition Services and Pharmacy Services  Weekly team conferences will be held on Tuesdays to discuss your progress.  Your Social Worker will talk with you frequently to get your input and to update you on team discussions.  Team conferences with you and your family in attendance may also be held.  Expected length of stay:  2 to 3 weeks  Overall anticipated outcome:  Minimal assistance  Depending on your progress and recovery, your program may change. Your Social Worker will coordinate services and will keep you informed of any changes. Your Social Worker's name and contact numbers are listed  below.  The following services may also be recommended but are not provided by the [facility] will be made to provide these services after discharge if needed.  Arrangements include referral to agencies that provide these services.  Your insurance has been verified to be:  Medicare and [employer] Your primary doctor is:  Dr. Leanna Battles  Pertinent information will be shared with your doctor and your  insurance company.  Social Worker:  Alfonse Alpers, LCSW  [phone] or [phone]  Information discussed with and copy given to patient by: Trey Sailors, 06/30/2019, 11:16 PM

## 2019-06-30 NOTE — Evaluation (Signed)
Physical Therapy Assessment and Plan  Patient Details  Name: [name] MRN: [id number] Date of Birth: 27-Aug-1928  PT Diagnosis: Abnormal posture, Abnormality of gait, Cognitive deficits, Difficulty walking, Hemiplegia non-dominant, Impaired cognition and Muscle weakness Rehab Potential: Good ELOS: 2-2.5 weeks   Today's Date: 06/30/2019 PT Individual Time: 1455-1535 PT Individual Time Calculation (min): 40 min    Problem List:  Patient Active Problem List   Diagnosis Date Noted  . Leukocytosis   . Controlled type 2 diabetes mellitus with hyperglycemia, without long-term current use of insulin ([place])   . Labile blood glucose   . PAF (paroxysmal atrial fibrillation) ([place])   . 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[place]) 06/28/2019  . CVA (cerebral vascular accident) ([place]) 06/27/2019  . TIA (transient ischemic attack) 06/26/2019  . Hyperlipidemia   . Type 2 diabetes mellitus ([place])   . Degeneration of lumbar intervertebral disc 12/11/2017  . Cough   . Hematoma   . Groin swelling 04/12/2017  . Hematoma of groin 04/12/2017  . Critical lower limb ischemia 04/08/2017  . Long term (current) use of anticoagulants [Z79.01] 12/13/2015  . Closed dislocation of tarsometatarsal joint 10/05/2015  . ULCER OF OTHER PART OF FOOT 06/06/2010  . Essential hypertension 03/26/2010  . ATRIAL FIBRILLATION 03/26/2010  . CUTANEOUS ERUPTIONS, DRUG-INDUCED 03/26/2010  . DIARRHEA 03/26/2010  . GERD 03/20/2010  . UNSPEC LOCAL INFECTION SKIN&SUBCUTANEOUS TISSUE 03/20/2010  . OSTEOARTHRITIS 03/20/2010  . ARTHRITIS, SEPTIC 03/14/2010  . OTH COMPLICATIONS DUE INTERNAL JOINT PROSTHESIS 02/22/2010    Past Medical History:  Past Medical History:  Diagnosis Date  . Acute osteomyelitis ([place]) 08/25/2018   Right toe  . Anemia   . Atrial fibrillation ([place])   . Carpal tunnel syndrome, bilateral   . Cellulitis 08/25/2018   Right foot  . Cervical lymphadenopathy   . Compression fracture of fifth lumbar vertebra  (HCC)    to T 11  . DDD (degenerative disc disease), lumbar   . DJD (degenerative joint disease)   . DOE (dyspnea on exertion)   . Dysphagia   . Dyspnea    with exertion  . Dysrhythmia    hx brief AF post op hip surg  . Esophageal dysmotility 01/31/2014   noted barium  . GERD (gastroesophageal reflux disease)   . Hematoma    right side after stent placement  . History of hiatal hernia 01/31/2014   Small, noted on Barium  . Hyperlipidemia   . Hypertension   . Jaundice   . Low back pain   . MRSA (methicillin resistant Staphylococcus aureus)   . Neuropathy   . Nocturia   . OA (osteoarthritis)   . Osteoporosis   . Ovarian cyst   . PAD (peripheral artery disease) ([place])   . Peripheral vascular disease ([place])   . Pleural effusion, left 04/17/2017   Small  . RA (rheumatoid arthritis) ([place])   . Spinal stenosis   . Toe ulcer, right, with necrosis of bone ([place]) 08/25/2018  . Type 2 diabetes mellitus ([place])   . Unsteady gait   . Urinary incontinence   . Uterine polyp   . Varicose vein of leg    Bilateral   Past Surgical History:  Past Surgical History:  Procedure Laterality Date  . AMPUTATION Right 02/04/2013   Procedure: [name] ;  Surgeon: Wylene Simmer, MD;  Location: [facility];  Service: Orthopedics;  Laterality: Right;  . AMPUTATION TOE Right 09/03/2018   Procedure: AMPUTATION 3RD RIGHT TOE INTERPHALANGEAL;  Surgeon: Amalia Hailey,  Dorathy Daft, DPM;  Location: [facility];  Service: Podiatry;  Laterality: Right;  . APPENDECTOMY    . COLONOSCOPY    . EYE SURGERY     both cataracts  . FEMORAL ARTERY EXPLORATION Right 04/12/2017   Procedure: EVACUATION HEMATOMA RIGHT GROIN WITH FEMORAL ARTERY REPAIR;  Surgeon: [place]etta Posner, MD;  Location: [facility];  Service: Vascular;  Laterality: Right;  . INCISION AND DRAINAGE  2011   left hip inf-hemovac  . IR ANGIOGRAM EXTREMITY LEFT  03/25/2017  . IR ANGIOGRAM EXTREMITY LEFT  04/08/2017  . IR ANGIOGRAM SELECTIVE EACH  ADDITIONAL VESSEL  03/25/2017  . IR FEM POP ART STENT INC PTA MOD SED  03/25/2017  . IR INFUSION THROMBOL ARTERIAL INITIAL (MS)  04/08/2017  . IR RADIOLOGIST EVAL & MGMT  02/26/2017  . IR RADIOLOGIST EVAL & MGMT  04/03/2017  . IR RADIOLOGIST EVAL & MGMT  08/20/2017  . IR RADIOLOGIST EVAL & MGMT  01/22/2018  . IR RADIOLOGIST EVAL & MGMT  01/13/2019  . IR THROMB F/U EVAL ART/VEN FINAL DAY (MS)  04/09/2017  . IR TIB-PERO ART PTA MOD SED  03/25/2017  . IR US GUIDE VASC ACCESS RIGHT  03/25/2017  . IR US GUIDE VASC ACCESS RIGHT  04/08/2017  . KNEE ARTHROSCOPY  1995   left  . NECK MASS EXCISION    . TONSILLECTOMY    . TOTAL HIP ARTHROPLASTY  2006   left-fx  . TOTAL KNEE ARTHROPLASTY  [id number]   left  . TOTAL KNEE ARTHROPLASTY  2009   right    Assessment & Plan Clinical Impression: Patient is a 85 year old right-handed female with history of PAF maintained on Coumadin, peripheral vascular disease status post left SFA angioplasty and stenting, type 2 diabetes mellitus, hypertension, hyperlipidemia. Per chart review patient lives with spouse. 1 level home with ramped entrance. Ambulates with a rolling walker. She was performing her own ADLs modified independent. Presented 06/26/2019 with left-sided weakness and slurred speech that has been transient over 2 days. No reports of chest pain or shortness of breath. Blood pressure in the ED 143/95-213/112. INR on admission of 2.00, COVID negative. Cranial CT scan negative for acute changes. Patient did not receive TPA. MRI of the brain showed small foci of acute early subacute ischemia within the posterior right temporal lobe within the PCA territory. MR angiogram showed short segment occlusion of both posterior cerebral arteries, the P2-3 junction on the right and proximal P2 segment on the left. Short segment occlusion of the right MCA M1 segment just proximal to the bifurcation with normal M2 branches. Carotid Dopplers with no significant ICA stenosis.  Echocardiogram with ejection fraction of 83% normal systolic function. Neurology consulted Coumadin transition to Eliquis as well as remaining on aspirin therapy. Tolerating a regular consistency diet. Appetite has been limited started on gentle IV fluids.   Patient transferred to CIR on 06/28/2019 .   Patient currently requires max with mobility secondary to muscle weakness, decreased cardiorespiratoy endurance, impaired timing and sequencing, unbalanced muscle activation and decreased motor planning, decreased attention to right, decreased initiation, decreased attention, decreased awareness, decreased problem solving, decreased safety awareness, decreased memory and delayed processing and decreased sitting balance, decreased standing balance, decreased postural control, hemiplegia and decreased balance strategies.  Prior to hospitalization, patient was modified independent  with mobility and lived with Significant other in a House home.  Home access is  Ramped entrance.  Patient will benefit from skilled PT intervention to maximize safe functional mobility, minimize fall  risk and decrease caregiver burden for planned discharge home with 24 hour assist.  Anticipate patient will benefit from follow up [facility] at discharge.  PT - End of Session Activity Tolerance: Tolerates < 10 min activity, no significant change in vital signs Endurance Deficit: Yes Endurance Deficit Description: decreased, kept eyes closed majority of time, suspect 2/2 fatigue PT Assessment Rehab Potential (ACUTE/IP ONLY): Good PT Barriers to Discharge: Medical stability PT Patient demonstrates impairments in the following area(s): Balance;Behavior;Endurance;Motor;Perception;Safety PT Transfers Functional Problem(s): Bed Mobility;Bed to Chair;Car;Floor;Furniture PT Locomotion Functional Problem(s): Ambulation(anticipate pt being a limited household ambulator) PT Plan PT Intensity: Minimum of 1-2 x/day ,45 to 90 minutes PT  Frequency: 5 out of 7 days PT Duration Estimated Length of Stay: 2-2.5 weeks PT Treatment/Interventions: Ambulation/gait training;Cognitive remediation/compensation;Discharge planning;DME/adaptive equipment instruction;Functional mobility training;Pain management;Psychosocial support;Splinting/orthotics;Therapeutic Activities;UE/LE Strength taining/ROM;Visual/perceptual remediation/compensation;UE/LE Coordination activities;Therapeutic Exercise;Stair training;Skin care/wound management;Patient/family education;Neuromuscular re-education;Functional electrical stimulation;Disease management/prevention;Community reintegration;Balance/vestibular training PT Transfers Anticipated Outcome(s): min assist PT Locomotion Anticipated Outcome(s): min assist w/ LRAD, household gait PT Recommendation Follow Up Recommendations: Home health PT Patient destination: Home Equipment Recommended: To be determined Equipment Details: has RW already  Skilled Therapeutic Intervention  Pt asleep in supine, easily woken up and agreeable to therapy, son present throughout session. Pt able to confirm majority of PLOF and home set-up information, son confirmed as well. Per son, family planning to take pt back to her home and arrange for 24/7 assist.   Bed mobility w/ mod assist and sit<>stand transfer w/ max assist to RW. Switched to youth RW w/ improved posture in stance and pt able to perform stand pivot to w/c w/ mod-max assist for RW management and weight shifting. Pt lethargic, but much more alert once seated in w/c. Ambulated 18' w/ RW and close w/c follow for safety, mod assist overall for RW management, verbal and tactile cues for steering (pt veering L majority of time), and for upright posture. Pt also w/ very slow gait pattern.   Instructed pt and son in results of PT evaluation as detailed below, PT POC, rehab potential, rehab goals, and discharge recommendations. Additionally discussed CIR's policies regarding fall  safety and use of chair alarm and/or quick release belt. Pt verbalized understanding and in agreement. Ended session in w/c and in care of son, all needs in reach. Son in agreement to call RN when he leaves in order to place alarm belt on pt.   PT Evaluation Precautions/Restrictions Precautions Precautions: None Restrictions Weight Bearing Restrictions: No General   Vital SignsTherapy Vitals Temp: 98.8 F (37.1 C) Temp Source: Oral Pulse Rate: 83 Resp: 16 BP: (!) 145/84 Patient Position (if appropriate): Sitting Oxygen Therapy SpO2: 99 % O2 Device: Room Air Pain Pain Assessment Pain Scale: 0-10 Pain Score: 5  Pain Type: Acute pain Pain Location: Neck Pain Orientation: Posterior Pain Descriptors / Indicators: Aching Pain Onset: On-going Home Living/Prior Functioning Home Living Available Help at Discharge: Family;Available 24 hours/day(spouse and children planning to arrange for 24/7 assist, unsure how much physical assist spouse will be able to give) Type of Home: House Home Access: Ramped entrance Home Layout: One level Bathroom Shower/Tub: Chiropodist: Standard Additional Comments: Son states that spouse provide minimal assistance and the rest of the family can assist her at discharge  Lives With: Significant other Prior Function Level of Independence: Independent with homemaking with ambulation;Independent with transfers;Independent with gait;Independent with basic ADLs;Requires assistive device for independence(RW) Driving: No Vocation: Retired Radiographer, therapeutic - Assessment Additional Comments: Pt unable to hold  eyes open for long enough for visual assessment Perception Perception: Impaired Comments: To be further tested Praxis Praxis: Impaired Praxis Impairment Details: Perseveration;Initiation;Motor planning  Cognition Overall Cognitive Status: Impaired/Different from baseline Arousal/Alertness: Awake/alert Attention:  Focused Focused Attention: Impaired Focused Attention Impairment: Functional basic Memory: Impaired Memory Impairment: Decreased short term memory Decreased Short Term Memory: Verbal basic Awareness: Impaired Awareness Impairment: Emergent impairment Problem Solving: Impaired Problem Solving Impairment: Verbal basic Executive Function: Initiating Initiating: Impaired Initiating Impairment: Functional basic Safety/Judgment: Impaired Comments: Pt's cognition/lethargy limited much of the eval Sensation Sensation Light Touch: Appears Intact Proprioception: Impaired by gross assessment Coordination Gross Motor Movements are Fluid and Coordinated: No Fine Motor Movements are Fluid and Coordinated: No Coordination and Movement Description: generalized movement, very mild L hemi Finger Nose Finger Test: undershoots, slow Motor  Motor Motor: Other (comment);Hemiplegia Motor - Skilled Clinical Observations: generalized weakness, mild L hemi  Mobility Bed Mobility Bed Mobility: Rolling Left;Supine to Sit;Sit to Supine;Rolling Right Rolling Right: Moderate Assistance - Patient 50-74% Rolling Left: Moderate Assistance - Patient 50-74% Supine to Sit: Moderate Assistance - Patient 50-74% Sit to Supine: Moderate Assistance - Patient 50-74% Transfers Transfers: Sit to Stand;Stand to Sit Sit to Stand: Maximal Assistance - Patient 25-49% Stand to Sit: Maximal Assistance - Patient 25-49% Stand Pivot Transfers: Moderate Assistance - Patient 50 - 74% Stand Pivot Transfer Details: Verbal cues for safe use of DME/AE;Verbal cues for precautions/safety;Verbal cues for sequencing;Manual facilitation for weight shifting;Tactile cues for placement;Tactile cues for sequencing;Tactile cues for posture Transfer (Assistive device): Rolling walker Locomotion  Gait Ambulation: Yes Gait Assistance: Moderate Assistance - Patient 50-74% Gait Distance (Feet): 40 Feet Assistive device: Rolling walker Gait  Assistance Details: Verbal cues for safe use of DME/AE;Tactile cues for initiation;Tactile cues for posture;Verbal cues for gait pattern Gait Gait: Yes Gait Pattern: Impaired Gait Pattern: Trunk flexed;Narrow base of support;Step-to pattern Gait velocity: very slow Stairs / Additional Locomotion Stairs: No Wheelchair Mobility Wheelchair Mobility: No  Trunk/Postural Assessment  Cervical Assessment Cervical Assessment: Exceptions to WFL(forward head) Thoracic Assessment Thoracic Assessment: Exceptions to WFL(kyphotic posture) Lumbar Assessment Lumbar Assessment: Exceptions to WFL(posterior pelvic tilt in sitting and standing) Postural Control Postural Control: Deficits on evaluation Righting Reactions: delayed  Balance Balance Balance Assessed: Yes Static Sitting Balance Static Sitting - Balance Support: Feet supported Static Sitting - Level of Assistance: 5: Stand by assistance Dynamic Sitting Balance Dynamic Sitting - Balance Support: Feet supported;No upper extremity supported Dynamic Sitting - Level of Assistance: 4: Min assist Dynamic Sitting - Balance Activities: Reaching for objects Static Standing Balance Static Standing - Balance Support: Bilateral upper extremity supported;During functional activity Static Standing - Level of Assistance: 4: Min assist Dynamic Standing Balance Dynamic Standing - Balance Support: Bilateral upper extremity supported;During functional activity Dynamic Standing - Level of Assistance: 2: Max assist Dynamic Standing - Comments: Peri hygiene following toileting Extremity Assessment  RUE Assessment RUE Assessment: Exceptions to [facility] General Strength Comments: 3/5 MMT LUE Assessment LUE Assessment: Exceptions to [facility] General Strength Comments: 3/5 MMT RLE Assessment RLE Assessment: Exceptions to [facility] Passive Range of Motion (PROM) Comments: WFL General Strength Comments: Globally 3 to 3+/5 LLE Assessment LLE Assessment: Exceptions to  [facility] Passive Range of Motion (PROM) Comments: WFL General Strength Comments: Globally 3 to 3+/5    Refer to Care Plan for Long Term Goals  Recommendations for other services: None   Discharge Criteria: Patient will be discharged from PT if patient refuses treatment 3 consecutive times without medical reason, if treatment goals not met, if  there is a change in medical status, if patient makes no progress towards goals or if patient is discharged from hospital.  The above assessment, treatment plan, treatment alternatives and goals were discussed and mutually agreed upon: by patient and by family  Georgio Hattabaugh Clent Demark 06/30/2019, 4:16 PM

## 2019-06-30 NOTE — Progress Notes (Signed)
Social Work Patient ID: [name], female   DOB: 1928-08-04, 85 y.o.   MRN: [id number]   CSW met with pt and her son today to update them on team conference discussion held yesterday.  Pt had a bad day yesterday, which was her day of evals, so these were placed on hold.  Today, therapists estimate that pt will need 2-3 weeks on CIR with minimal assistance needed at d/c.  No d/c date was able to be set.  Pt's son expressed understanding of the above and is most focused on pt achieving medical stability first and then he knows pt will be able to focus on therapy.  CSW will continue to follow and assist as needed.

## 2019-07-01 ENCOUNTER — Encounter (HOSPITAL_COMMUNITY): Payer: Medicare Other

## 2019-07-01 ENCOUNTER — Inpatient Hospital Stay (HOSPITAL_COMMUNITY): Payer: Medicare Other | Admitting: Speech Pathology

## 2019-07-01 ENCOUNTER — Inpatient Hospital Stay (HOSPITAL_COMMUNITY): Payer: Medicare Other

## 2019-07-01 DIAGNOSIS — R569 Unspecified convulsions: Secondary | ICD-10-CM

## 2019-07-01 LAB — GLUCOSE, CAPILLARY
Glucose-Capillary: 170 mg/dL — ABNORMAL HIGH (ref 70–99)
Glucose-Capillary: 236 mg/dL — ABNORMAL HIGH (ref 70–99)
Glucose-Capillary: 162 mg/dL — ABNORMAL HIGH (ref 70–99)
Glucose-Capillary: 302 mg/dL — ABNORMAL HIGH (ref 70–99)

## 2019-07-01 NOTE — IPOC Note (Signed)
Overall Plan of Care [facility]) Patient Details Name: KRYSTOL ROCCO MRN: [id number] DOB: May 30, 1928  Admitting Diagnosis: Right temporal lobe infarction [facility])  Hospital Problems: Principal Problem:   Right temporal lobe infarction [facility]) Active Problems:   Leukocytosis   Controlled type 2 diabetes mellitus with hyperglycemia, without long-term current use of insulin (HCC)   Labile blood glucose   PAF (paroxysmal atrial fibrillation) (HCC)   Seizure ([facility])     Functional Problem List: Nursing Endurance, Medication Management, Safety, Sensory  PT Balance, Behavior, Endurance, Motor, Perception, Safety  OT Balance, Safety, Perception, Cognition, Endurance, Vision, Motor, Pain  SLP Cognition, Endurance, Safety  TR         Basic ADL's: OT Bathing, Dressing, Toileting     Advanced  ADL's: OT       Transfers: PT Bed Mobility, Bed to Chair, Car, Floor, Manufacturing systems engineer, Tub/Shower     Locomotion: PT Ambulation(anticipate pt being a limited household ambulator)     Additional Impairments: OT None  SLP Swallowing, Social Cognition   Problem Solving, Memory, Attention, Awareness, Social Interaction  TR      Anticipated Outcomes Item Anticipated Outcome  Self Feeding no goal set  Swallowing  mechanical soft diet/thin liquids, supervision   Basic self-care  min A  Toileting  min A   Bathroom Transfers min A  Bowel/Bladder  Patient to continue to be continent of bowel and bladder during admission  Transfers  min assist  Locomotion  min assist w/ LRAD, household gait  Communication  supervision  Cognition  modA  Pain  Patient to be pain free or pain less than 3 during admission  Safety/Judgment  Patient to be free from falls and adhere to safety plan   Therapy Plan: PT Intensity: Minimum of 1-2 x/day ,45 to 90 minutes PT Frequency: 5 out of 7 days PT Duration Estimated Length of Stay: 2-2.5 weeks OT Intensity: Minimum of 1-2 x/day, 45 to 90 minutes OT  Frequency: 5 out of 7 days OT Duration/Estimated Length of Stay: 2-3 weeks SLP Intensity: Minumum of 1-2 x/day, 30 to 90 minutes SLP Frequency: 3 to 5 out of 7 days SLP Duration/Estimated Length of Stay: 2-2.5 weeks   Due to the current state of emergency, patients may not be receiving their 3-hours of Medicare-mandated therapy.   Team Interventions: Nursing Interventions Patient/Family Education, Disease Management/Prevention, Medication Management  PT interventions Ambulation/gait training, Cognitive remediation/compensation, Discharge planning, DME/adaptive equipment instruction, Functional mobility training, Pain management, Psychosocial support, Splinting/orthotics, Therapeutic Activities, UE/LE Strength taining/ROM, Visual/perceptual remediation/compensation, UE/LE Coordination activities, Therapeutic Exercise, Stair training, Skin care/wound management, Patient/family education, Neuromuscular re-education, Functional electrical stimulation, Disease management/prevention, Community reintegration, Training and development officer  OT Interventions Training and development officer, Discharge planning, Pain management, Self Care/advanced ADL retraining, UE/LE Coordination activities, Therapeutic Activities, Cognitive remediation/compensation, Disease mangement/prevention, Functional mobility training, Patient/family education, Therapeutic Exercise, Visual/perceptual remediation/compensation, Wheelchair propulsion/positioning, UE/LE Strength taining/ROM, Psychosocial support, Neuromuscular re-education, DME/adaptive equipment instruction, Community reintegration  SLP Interventions Cognitive remediation/compensation, Internal/external aids, Environmental controls, Multimodal communication approach, Dysphagia/aspiration precaution training, Functional tasks, Medication managment, Patient/family education, Therapeutic Activities  TR Interventions    SW/CM Interventions Discharge Planning, Psychosocial Support,  Patient/Family Education   Barriers to Discharge MD  Medical stability  Nursing      PT Medical stability    OT Medical stability pt with questionable change in medical status, potentially impacting current cognition  SLP Decreased caregiver support Pt's husband is 83 years old  SW       Team Discharge Planning: Destination:  PT-Home ,OT- Home , SLP-Home Projected Follow-up: PT-Home health PT, OT-  Home health OT, SLP-24 hour supervision/assistance, Home Health SLP, Outpatient SLP Projected Equipment Needs: PT-To be determined, OT- To be determined, SLP-None recommended by SLP Equipment Details: PT-has RW already, OT-  Patient/family involved in discharge planning: PT- Patient, Family member/caregiver,  OT-Patient, Family member/caregiver, SLP-Family member/caregiver, Patient  MD ELOS: 15-21 days Medical Rehab Prognosis:  Excellent Assessment: The patient has been admitted for CIR therapies with the diagnosis of right temporal infarct. The team will be addressing functional mobility, strength, stamina, balance, safety, adaptive techniques and equipment, self-care, bowel and bladder mgt, patient and caregiver education, NMR, cognition visual spatial awareness, community reentry. Goals have been set at min assist with mobiliy and self-care and supervision to mod assist with cognition and communication.   Due to the current state of emergency, patients may not be receiving their 3 hours per day of Medicare-mandated therapy.    Meredith Staggers, MD, FAAPMR      See Team Conference Notes for weekly updates to the plan of care

## 2019-07-01 NOTE — Progress Notes (Addendum)
Physical Therapy Session Note  Patient Details  Name: [name] MRN: [id number] Date of Birth: 1928/10/31  Today's Date: 07/01/2019 PT Individual Time: 1405-1505 PT Individual Time Calculation (min): 60 min   Short Term Goals: Week 1:  PT Short Term Goal 1 (Week 1): Pt will participate in 30 min of OOB activity w/o increase in fatigue PT Short Term Goal 2 (Week 1): Pt will transfer sit<>stand w/ mod assist consistently PT Short Term Goal 3 (Week 1): Pt will perform bed mobility w/ mod assist  Skilled Therapeutic Interventions/Progress Updates:   Pt sitting up in bed attempting to get OOB, needing to use toilet emergently.  Son present.  Sit> stand with mod/max assist to RW.  Pt ignored L environment unless physically turned to L by PT, to look for bathroom.  Mod assist gait in room to toilet.  Mod assist for balance toilet transfer and clothing mgt. Continent of urine.  With visual tracking assessment, pt slowly tracked L with mod cues to initiate.  Hand wahsing from w/c level with set up.  Seated therapeutic activity , for L attention, L hand use, visual tracking > L,  L/R hand to grasp clothes pins and attach to horizontal bar to her L.  Mod cues needed for continued attention to L.  Simulated car transfer with max assist for sit> stand to RW, max cues for safety and hand placement.   Sit> stand with multimodal cues to scoot forward before attempting to stand, mod/max assist to RW.  Gait training x 40' with RW, min assist.  10 MWT time = 0.73'/sec.  Mod cues for upright trunk, forward gaze.  Sit> supine with CGA.  At end of session, pt resting in bed with alarm set and needs at hand.  K pad placed around neck. Son present    Therapy Documentation Precautions:  Precautions Precautions: None Restrictions Weight Bearing Restrictions: No  Pain: Pain Assessment Pain Scale: 0-10 Pain Score: 5  Pain Type: Acute pain Pain Location: Neck Pain Orientation: Posterior Pain  Descriptors / Indicators: Aching Pain Onset: On-going Patients Stated Pain Goal: 0 Pain Intervention(s): Repositioned;Emotional support;Heat applied       Therapy/Group: Individual Therapy  Darrius Montano 07/01/2019, 4:26 PM

## 2019-07-01 NOTE — Progress Notes (Signed)
[facility] PHYSICAL MEDICINE & REHABILITATION PROGRESS NOTE   Subjective/Complaints: Pt alert and working with OT dressing. Was a bit confused and restless last night per staff, son  ROS: Limited due to cognitive/behavioral    Objective:   No results found. Recent Labs    06/29/19 0551  WBC 10.8*  HGB 17.5*  HCT 51.1*  PLT 205   Recent Labs    06/29/19 0551  NA 140  K 3.7  CL 106  CO2 22  GLUCOSE 160*  BUN 28*  CREATININE 0.86  CALCIUM 8.9    Intake/Output Summary (Last 24 hours) at 07/01/2019 1332 Last data filed at 07/01/2019 0700 Gross per 24 hour  Intake 555 ml  Output -  Net 555 ml     Physical Exam: Vital Signs Blood pressure (!) 160/99, pulse 76, temperature 98.6 F (37 C), resp. rate 15, height 5\' 3"  (1.6 m), weight 52 kg, SpO2 93 %. Constitutional: No distress . Vital signs reviewed. HEENT: EOMI, oral membranes moist Neck: supple Cardiovascular: IRR IRR without murmur. No JVD    Respiratory: CTA Bilaterally without wheezes or rales. Normal effort    GI: BS +, non-tender, non-distended  Skin: Warm and dry.  Intact. Psych: Normal mood.  Normal behavior. Musc: No edema.  No tenderness. Neurological: Alert, follows simple commands. Oriented to person and place, "stroke" Left eye ptosis Motor:?  Limited due to participation RUE/RLE: Grossly 4-/5 LUE/LE: Grossly 4/5 Skin: Missing 3rd and 5th toes right foot and 3rd toe left foot.  Intact.   Psychiatric: pleasant and cooperative  Assessment/Plan: 1. Functional deficits secondary to right temporal lobe/PCA distribution infarct which require 3+ hours per day of interdisciplinary therapy in a comprehensive inpatient rehab setting.  Physiatrist is providing close team supervision and 24 hour management of active medical problems listed below.  Physiatrist and rehab team continue to assess barriers to discharge/monitor patient progress toward functional and medical goals  Care Tool:  Bathing     Body parts bathed by patient: Right arm, Left arm, Chest, Abdomen, Right upper leg, Left upper leg, Face   Body parts bathed by helper: Buttocks, Front perineal area, Right lower leg, Left lower leg     Bathing assist Assist Level: Moderate Assistance - Patient 50 - 74%     Upper Body Dressing/Undressing Upper body dressing   What is the patient wearing?: Hospital gown only    Upper body assist Assist Level: Moderate Assistance - Patient 50 - 74%    Lower Body Dressing/Undressing Lower body dressing      What is the patient wearing?: Pants, Incontinence brief     Lower body assist Assist for lower body dressing: Maximal Assistance - Patient 25 - 49%     Toileting Toileting    Toileting assist Assist for toileting: Maximal Assistance - Patient 25 - 49%     Transfers Chair/bed transfer  Transfers assist  Chair/bed transfer activity did not occur: Safety/medical concerns  Chair/bed transfer assist level: Moderate Assistance - Patient 50 - 74%     Locomotion Ambulation   Ambulation assist   Ambulation activity did not occur: Safety/medical concerns  Assist level: Moderate Assistance - Patient 50 - 74% Assistive device: Walker-rolling Max distance: 40'   Walk 10 feet activity   Assist  Walk 10 feet activity did not occur: Safety/medical concerns  Assist level: Moderate Assistance - Patient - 50 - 74% Assistive device: Walker-rolling   Walk 50 feet activity   Assist Walk 50 feet with 2 turns  activity did not occur: Safety/medical concerns         Walk 150 feet activity   Assist Walk 150 feet activity did not occur: Safety/medical concerns         Walk 10 feet on uneven surface  activity   Assist Walk 10 feet on uneven surfaces activity did not occur: Safety/medical concerns         Wheelchair     Assist Will patient use wheelchair at discharge?: No   Wheelchair activity did not occur: N/A         Wheelchair 50 feet with  2 turns activity    Assist    Wheelchair 50 feet with 2 turns activity did not occur: N/A       Wheelchair 150 feet activity     Assist Wheelchair 150 feet activity did not occur: N/A        Medical Problem List and Plan: 1.Left-sided weakness with slurred speechsecondary to acute early subacute ischemia within the posterior right temporal lobe within the PCA territory. Continue CIR  Repeat head CT stable.  Likely seizures, broken with ativan, now on keppra 500mg  bid   -Neuro following. Appreciate assistance  EEG negative for seizures  -HS confusion likely related to cva, possibly keppra as well  -follow for now, spoke with son.  2. Antithrombotics: -DVT/anticoagulation:Eliquis -antiplatelet therapy: Aspirin 325 mg daily 3. Pain Management:Tylenol as needed 4. Mood:Provide emotional support -antipsychotic agents: N/A 5. Neuropsych: This patientisnot capable of making decisions on herown behalf. 6. Skin/Wound Care:Routine skin checks 7. Fluids/Electrolytes/Nutrition:encourage appropriate PO 8. PAF. Continue Eliquis  ECG  showing A. fib 9. Hypertension. Toprol 12.5 mg daily.  Increased Toprol to 25mg  bid   Remains elevated on 8/13, avoid overtreatment---observe today    10. Diabetes mellitus with hyperglycemia. Hemoglobin A1c 7.4. SSI. Patient on Glucophage 500 mg twice daily prior to admission. Resume as needed based on trends  Poor control. Begin low dose glucophage 250mg  bid 11. Hyperlipidemia. Lipitor 12.Peripheral vascular disease with history of left SFA angioplasty and stenting in the past. Continue low-dose aspirin 13.Decreased nutritional storage. Dietary follow-up.  IV fluids. -protein supp 14.  Leukocytosis  WBCs 10.8 on 8/11  Continue to monitor   LOS: 3 days A FACE TO [place] 07/01/2019, 1:32 PM

## 2019-07-01 NOTE — Progress Notes (Signed)
Speech Language Pathology Daily Session Note  Patient Details  Name: [name] MRN: [id number] Date of Birth: [birth year]  Today's Date: 07/01/2019 SLP Individual Time: 1010-1105 SLP Individual Time Calculation (min): 55 min  Short Term Goals: Week 1: SLP Short Term Goal 1 (Week 1): Pt will tolerate current (dys2/thin) diet without overt s/s aspiration or decline in respiratory status SLP Short Term Goal 2 (Week 1): Pt will consistently demonstrate orientation x4 with mod A multimodal cues SLP Short Term Goal 3 (Week 1): Pt will sustain attention to functional task for 5 minutes with modA multimodal cues SLP Short Term Goal 4 (Week 1): Pt will demonstrate functional recall of daily events with modA multimodal cues SLP Short Term Goal 5 (Week 1): Pt will complete simple mental math problems with modA multimodal cues  Skilled Therapeutic Interventions: Pt was seen for skilled ST intervention targeting aforementioned goals. SLP facilitated session by providing Ensure shake via straw to assess tolerance of liquids via straw, and maximize opportunities for po intake. Son was present again today, and reports pt is variable in her ability and desire to use a straw. SLP educated that either is appropriate for pt to use. Pt was more alert during this session, however, she becomes quite fatigued by the end of the session. Pt did not require verbal or tactile cues to maintain alertness today. Pt was able to provide orientation information more correctly today - Pt was accurate for month and year, but not day/date. Pt was oriented to person and place as well as situation today. Son reports pt used to read the newspaper each evening. SLP provided today's paper to target attention to task, and recall. Pt verbalized having difficulty focusing on the words. She was able to read the headline and name of the paper, as the print was much larger. SLP read short paragraphs aloud and asked pt questions regarding the  information. Pt was 70% accurate with immediate recall of information presented. Max cues required to figure out mental math, with which pt had "no difficulty" prior to admit per son. Pt became quite sleepy and session was terminated. Pt was left in bed with alarm on, all needs within reach, son present. Continue ST per current plan of care.  Pain Pain Assessment Pain Scale: 0-10 Pain Score: 5  Pain Type: Acute pain Pain Location: Neck Pain Orientation: Posterior Pain Descriptors / Indicators: Aching Pain Onset: On-going Patients Stated Pain Goal: 0 Pain Intervention(s): Repositioned;Emotional support;Heat applied  Therapy/Group: Individual Therapy  Katya Rolston B. Quentin Ore, [facility], [place] Speech Language Pathologist  Shonna Chock 07/01/2019, 1:45 PM

## 2019-07-01 NOTE — Plan of Care (Signed)
  Problem: Consults Goal: RH STROKE PATIENT EDUCATION Description: See Patient Education module for education specifics  Outcome: Progressing Goal: Nutrition Consult-if indicated Outcome: Progressing Goal: Diabetes Guidelines if Diabetic/Glucose > 140 Description: If diabetic or lab glucose is > 140 mg/dl - Initiate Diabetes/Hyperglycemia Guidelines & Document Interventions  Outcome: Progressing   Problem: RH BOWEL ELIMINATION Goal: RH STG MANAGE BOWEL WITH ASSISTANCE Description: STG Manage Bowel with Min Assistance. Outcome: Progressing Goal: RH STG MANAGE BOWEL W/MEDICATION W/ASSISTANCE Description: STG Manage Bowel with Medication with [place]. Outcome: Progressing   Problem: RH BLADDER ELIMINATION Goal: RH STG MANAGE BLADDER WITH ASSISTANCE Description: STG Manage Bladder With Min Assistance Outcome: Progressing   Problem: RH SKIN INTEGRITY Goal: RH STG MAINTAIN SKIN INTEGRITY WITH ASSISTANCE Description: STG Maintain Skin Integrity With [place]. Outcome: Progressing Goal: RH STG ABLE TO PERFORM INCISION/WOUND CARE W/ASSISTANCE Description: STG Able To Perform Incision/Wound Care With [employer]. Outcome: Progressing   Problem: RH SAFETY Goal: RH STG ADHERE TO SAFETY PRECAUTIONS W/ASSISTANCE/DEVICE Description: STG Adhere to Safety Precautions With Min Assistance/Device. Outcome: Progressing

## 2019-07-01 NOTE — Progress Notes (Signed)
Occupational Therapy Session Note  Patient Details  Name: SHYANA KULAKOWSKI MRN: [id number] Date of Birth: [birth year]  Today's Date: 07/01/2019 OT Individual Time: 7353-2992 OT Individual Time Calculation (min): 75 min    Short Term Goals: Week 1:  OT Short Term Goal 1 (Week 1): Pt will transfer to [facility] with mod A OT Short Term Goal 2 (Week 1): Pt will don pants with mod A OT Short Term Goal 3 (Week 1): Pt will don shirt with min A OT Short Term Goal 4 (Week 1): Pt will require no more than mod cueing for focused attention to 1 grooming task  Skilled Therapeutic Interventions/Progress Updates:    1:1. Pt received in bed easily aroused with no pain reported. Pt completes supine>sitting with MOD A for trunk elevation and completes stand pivot transfers with MAX A d/t retropulsion with VC for hand placement and achieving fully erect posture. Pt toilets with MAX A for CM but able to compelte hygiene in standing with MAX A for balance. Pt self feed with MOD A for set up of tray d/t decreased [place] for BUE tasks, however pt attempting to use LUE as assist without cueing. Pt requires mod VC for small bites and sips with 2 episodes of coughing after thin liquid consumption with straw. Pt demo increased oral holding requiring min VC for swallowing in timely fashion when pt attempting to reach for other objects or talk. Pt bathes at sink seated with A to locate all needed items and open soap bottle d/t decreased grip strength. Pt washes UB with supervision and dons shirt with min A to pull over head. Pt completes LB bathing with A to wash B feet. Pt threads BLE into pants with max A and sit to stand with MOD A at sink to advance pants past hips. Pt grooms seated with VC for holding toothpaste in LUE and manipulating cap with RUE. Exied session with pt seated in w/c, belt alarm on, son in room and call light in reach  Therapy Documentation Precautions:  Precautions Precautions: None Restrictions Weight Bearing  Restrictions: No General:   Vital Signs: Therapy Vitals Temp: 98.6 F (37 C) Pulse Rate: 76 Resp: 15 BP: (!) 160/99 Patient Position (if appropriate): Lying Oxygen Therapy SpO2: 93 % O2 Device: Room Air Pain:   ADL: ADL Eating: Set up, Supervision/safety Where Assessed-Eating: Bed level Grooming: Contact guard Where Assessed-Grooming: Edge of bed Upper Body Bathing: Minimal assistance Where Assessed-Upper Body Bathing: Edge of bed Lower Body Bathing: Maximal assistance Where Assessed-Lower Body Bathing: Edge of bed Upper Body Dressing: Moderate assistance Where Assessed-Upper Body Dressing: Edge of bed Lower Body Dressing: Maximal assistance Where Assessed-Lower Body Dressing: Edge of bed Toileting: Maximal assistance Where Assessed-Toileting: Bedside Commode Toilet Transfer: Maximal assistance Toilet Transfer Method: Stand pivot Science writer: Extra wide bedside commode Vision   Perception    Praxis   Exercises:   Other Treatments:     Therapy/Group: Individual Therapy  Tonny Branch 07/01/2019, 7:19 AM

## 2019-07-01 NOTE — Progress Notes (Signed)
STROKE TEAM PROGRESS NOTE   SUBJECTIVE (INTERVAL HISTORY) Pt son is at bedside. She worked with OT and speech today and when she worked with PT yesterday, she was able to work in the hallway to the next door. She ate well last night and this am, also on Ensure.   OBJECTIVE Temp:  [98.2 F (36.8 C)-98.8 F (37.1 C)] 98.6 F (37 C) (08/13 0556) Pulse Rate:  [70-83] 76 (08/13 0556) Resp:  [15-20] 15 (08/13 0556) BP: (138-160)/(71-99) 160/99 (08/13 0556) SpO2:  [93 %-99 %] 93 % (08/13 0556) Weight:  [52 kg] 52 kg (08/12 1900)  Recent Labs  Lab 06/30/19 0611 06/30/19 1205 06/30/19 1648 06/30/19 2113 07/01/19 0656  GLUCAP 132* 240* 282* 160* 170*   Recent Labs  Lab 06/26/19 1958 06/28/19 0636 06/29/19 0551  NA 140 140 140  K 4.5 4.0 3.7  CL 104 105 106  CO2 26 17* 22  GLUCOSE 148* 211* 160*  BUN 21 31* 28*  CREATININE 0.77 1.13* 0.86  CALCIUM 9.9 9.1 8.9   Recent Labs  Lab 06/26/19 1958 06/29/19 0551  AST 21 23  ALT 20 20  ALKPHOS 66 56  BILITOT 0.9 1.8*  PROT 7.4 6.3*  ALBUMIN 4.1 3.6   Recent Labs  Lab 06/26/19 1958 06/28/19 0636 06/29/19 0551  WBC 8.1 14.1* 10.8*  NEUTROABS 5.8  --  8.1*  HGB 17.6* 18.3* 17.5*  HCT 53.2* 53.3* 51.1*  MCV 97.1 94.7 95.0  PLT 197 225 205   No results for input(s): CKTOTAL, CKMB, CKMBINDEX, TROPONINI in the last 168 hours. No results for input(s): LABPROT, INR in the last 72 hours. No results for input(s): COLORURINE, LABSPEC, [place], GLUCOSEU, HGBUR, BILIRUBINUR, KETONESUR, PROTEINUR, UROBILINOGEN, NITRITE, LEUKOCYTESUR in the last 72 hours.  Invalid input(s): APPERANCEUR     Component Value Date/Time   CHOL 187 06/27/2019 0411   TRIG 170 (H) 06/27/2019 0411   HDL 27 (L) 06/27/2019 0411   CHOLHDL 6.9 06/27/2019 0411   VLDL 34 06/27/2019 0411   LDLCALC 126 (H) 06/27/2019 0411   Lab Results  Component Value Date   HGBA1C 7.4 (H) 06/27/2019   No results found for: LABOPIA, COCAINSCRNUR, LABBENZ, AMPHETMU, THCU,  LABBARB  No results for input(s): ETH in the last 168 hours.  I have personally reviewed the radiological images below and agree with the radiology interpretations.  Ct Head Wo Contrast  Result Date: 06/29/2019 CLINICAL DATA:  Altered mental status with unclear cause EXAM: CT HEAD WITHOUT CONTRAST TECHNIQUE: Contiguous axial images were obtained from the base of the skull through the vertex without intravenous contrast. COMPARISON:  Brain MRI from 2 days ago FINDINGS: Brain: No evidence of acute infarction, hemorrhage, hydrocephalus, extra-axial collection or mass lesion/mass effect. Chronic lacune in the left frontal white matter above the frontal horn. Age normal cerebral volume. Vascular: No hyperdense vessel or unexpected calcification. Skull: Osteopenic appearance.  No acute finding or lesion. Sinuses/Orbits: Opacified right frontal sinus that is stable and incidental to the history IMPRESSION: Stable exam.  No acute or reversible finding. Electronically Signed   By: Monte Fantasia M.D.   On: 06/29/2019 11:40   Ct Head Wo Contrast  Result Date: 06/26/2019 CLINICAL DATA:  Tingling in left hand. EXAM: CT HEAD WITHOUT CONTRAST TECHNIQUE: Contiguous axial images were obtained from the base of the skull through the vertex without intravenous contrast. COMPARISON:  October 13, 2007 FINDINGS: Brain: No subdural, epidural, or subarachnoid hemorrhage. Ventricles and sulci are unremarkable. Cerebellum, brainstem, and basal cisterns  are normal. Mild white matter changes identified. No acute cortical ischemia or infarct is noted. No mass effect or midline shift is identified. Vascular: Calcified atherosclerosis is seen in the intracranial carotids. Skull: Normal. Negative for fracture or focal lesion. Sinuses/Orbits: Opacification of the right frontal sinus is identified. Paranasal sinuses, mastoid air cells, and middle ears are otherwise normal. Other: None. IMPRESSION: 1. No acute intracranial abnormalities  are identified. Electronically Signed   By: Dorise Bullion III M.D   On: 06/26/2019 19:55   Mr Angio Head Wo Contrast  Result Date: 06/27/2019 CLINICAL DATA:  Left-sided facial droop and slurred speech with left hand weakness. Three episodes in last 2 days. EXAM: MRI HEAD WITHOUT CONTRAST MRA HEAD WITHOUT CONTRAST TECHNIQUE: Multiplanar, multiecho pulse sequences of the brain and surrounding structures were obtained without intravenous contrast. Angiographic images of the head were obtained using MRA technique without contrast. COMPARISON:  Head CT 06/26/2019 Brain MRI 09/24/2009 FINDINGS: MRI HEAD FINDINGS BRAIN: There are 2-3 small areas of abnormal diffusion restriction within the posterior right temporal lobe (series 5, image 63). The white matter signal is normal for the patient's age. The cerebral and cerebellar volume are age-appropriate. There is no hydrocephalus. The midline structures are normal. VASCULAR: There are 2 scattered foci of chronic microhemorrhage in the supratentorial brain. The major intracranial arterial and venous sinus flow voids are normal. SKULL AND UPPER CERVICAL SPINE: Calvarial bone marrow signal is normal. There is no skull base mass. The visualized upper cervical spine and soft tissues are normal. SINUSES/ORBITS: There are no fluid levels or advanced mucosal thickening. The mastoid air cells and middle ear cavities are free of fluid. The orbits are normal. MRA HEAD FINDINGS POSTERIOR CIRCULATION: --Vertebral arteries: Normal V4 segments. --Posterior inferior cerebellar arteries (PICA): The left PICA and AICA share a common origin. Normal origin of right PICA from the ipsilateral vertebral artery. --Anterior inferior cerebellar arteries (AICA): Patent origins from the basilar artery. --Basilar artery: Normal. --Superior cerebellar arteries: Normal. --Posterior cerebral arteries (PCA): The right PCA is occluded at the P2-3 junction with distal reconstitution. The left PCA is  occluded as short segment of the proximal P2 segment. The distal left PCA is patent. ANTERIOR CIRCULATION: --Intracranial internal carotid arteries: Normal. --Anterior cerebral arteries (ACA): Multifocal stenosis of the left anterior cerebral artery with areas of intermittent loss of normal flow related enhancement. --Middle cerebral arteries (MCA): Short segment loss of enhancement of the right middle cerebral artery distal M1 segment. The M2 branches are normal. The left M1 segment is normal. There is moderate stenosis of the left M2 inferior division. IMPRESSION: 1. 2-3 small foci of acute to early subacute ischemia within the posterior right temporal lobe, within the PCA territory. 2. 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 3. Short segment occlusion of the right MCA M1 segment, just proximal to the bifurcation, with normal M2 branches. 4. Moderate stenosis of the left M2 inferior division. Electronically Signed   By: Ulyses Jarred M.D.   On: 06/27/2019 00:42   Mr Brain Wo Contrast  Result Date: 06/27/2019 CLINICAL DATA:  Left-sided facial droop and slurred speech with left hand weakness. Three episodes in last 2 days. EXAM: MRI HEAD WITHOUT CONTRAST MRA HEAD WITHOUT CONTRAST TECHNIQUE: Multiplanar, multiecho pulse sequences of the brain and surrounding structures were obtained without intravenous contrast. Angiographic images of the head were obtained using MRA technique without contrast. COMPARISON:  Head CT 06/26/2019  Brain MRI 09/24/2009 FINDINGS: MRI HEAD FINDINGS BRAIN: There are 2-3 small areas of abnormal diffusion restriction within the posterior right temporal lobe (series 5, image 63). The white matter signal is normal for the patient's age. The cerebral and cerebellar volume are age-appropriate. There is no hydrocephalus. The midline structures are normal. VASCULAR: There are 2 scattered foci of chronic  microhemorrhage in the supratentorial brain. The major intracranial arterial and venous sinus flow voids are normal. SKULL AND UPPER CERVICAL SPINE: Calvarial bone marrow signal is normal. There is no skull base mass. The visualized upper cervical spine and soft tissues are normal. SINUSES/ORBITS: There are no fluid levels or advanced mucosal thickening. The mastoid air cells and middle ear cavities are free of fluid. The orbits are normal. MRA HEAD FINDINGS POSTERIOR CIRCULATION: --Vertebral arteries: Normal V4 segments. --Posterior inferior cerebellar arteries (PICA): The left PICA and AICA share a common origin. Normal origin of right PICA from the ipsilateral vertebral artery. --Anterior inferior cerebellar arteries (AICA): Patent origins from the basilar artery. --Basilar artery: Normal. --Superior cerebellar arteries: Normal. --Posterior cerebral arteries (PCA): The right PCA is occluded at the P2-3 junction with distal reconstitution. The left PCA is occluded as short segment of the proximal P2 segment. The distal left PCA is patent. ANTERIOR CIRCULATION: --Intracranial internal carotid arteries: Normal. --Anterior cerebral arteries (ACA): Multifocal stenosis of the left anterior cerebral artery with areas of intermittent loss of normal flow related enhancement. --Middle cerebral arteries (MCA): Short segment loss of enhancement of the right middle cerebral artery distal M1 segment. The M2 branches are normal. The left M1 segment is normal. There is moderate stenosis of the left M2 inferior division. IMPRESSION: 1. 2-3 small foci of acute to early subacute ischemia within the posterior right temporal lobe, within the PCA territory. 2. 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 3. Short segment occlusion of the right MCA M1 segment, just proximal to the bifurcation, with normal M2 branches. 4. Moderate stenosis of the left  M2 inferior division. Electronically Signed   By: Ulyses Jarred M.D.   On: 06/27/2019 00:42   Vas US Carotid (at [place] Only)  Result Date: 06/27/2019 Carotid Arterial Duplex Study Indications:       TIA. Risk Factors:      Hypertension, hyperlipidemia, Diabetes, PAD. Comparison Study:  no prior Performing Technologist: Abram Sander RVS  Examination Guidelines: A complete evaluation includes B-mode imaging, spectral Doppler, color Doppler, and power Doppler as needed of all accessible portions of each vessel. Bilateral testing is considered an integral part of a complete examination. Limited examinations for reoccurring indications may be performed as noted.  Right Carotid Findings: +----------+--------+--------+--------+------------------------+--------+           PSV cm/sEDV cm/sStenosisDescribe                Comments +----------+--------+--------+--------+------------------------+--------+ CCA Prox  48      10              calcific and homogeneous         +----------+--------+--------+--------+------------------------+--------+ CCA Distal38      11              homogeneous                      +----------+--------+--------+--------+------------------------+--------+ ICA Prox  31      11      1-39%   homogeneous                      +----------+--------+--------+--------+------------------------+--------+  ICA Distal36      11                                               +----------+--------+--------+--------+------------------------+--------+ ECA       42                                                       +----------+--------+--------+--------+------------------------+--------+ +----------+--------+-------+--------+-------------------+           PSV cm/sEDV cmsDescribeArm Pressure (mmHG) +----------+--------+-------+--------+-------------------+ [id number]                                          +----------+--------+-------+--------+-------------------+ +---------+--------+--+--------+--+---------+ VertebralPSV cm/s42EDV cm/s13Antegrade +---------+--------+--+--------+--+---------+  Left Carotid Findings: +----------+--------+--------+--------+--------+--------+           PSV cm/sEDV cm/sStenosisDescribeComments +----------+--------+--------+--------+--------+--------+ CCA Prox  46      8               calcific         +----------+--------+--------+--------+--------+--------+ CCA Distal58      16              calcific         +----------+--------+--------+--------+--------+--------+ ICA Prox  59      13      1-39%   calcific         +----------+--------+--------+--------+--------+--------+ ICA Distal45      17                               +----------+--------+--------+--------+--------+--------+ ECA       74      11                               +----------+--------+--------+--------+--------+--------+ +----------+--------+--------+--------+-------------------+ SubclavianPSV cm/sEDV cm/sDescribeArm Pressure (mmHG) +----------+--------+--------+--------+-------------------+           66                                          +----------+--------+--------+--------+-------------------+ +---------+--------+--+--------+-+---------+ VertebralPSV cm/s30EDV cm/s8Antegrade +---------+--------+--+--------+-+---------+  Summary: Right Carotid: Velocities in the right ICA are consistent with a 1-39% stenosis. Left Carotid: Velocities in the left ICA are consistent with a 1-39% stenosis. Vertebrals: Bilateral vertebral arteries demonstrate antegrade flow. *See table(s) above for measurements and observations.  Electronically signed by Harold Barban MD on 06/27/2019 at 12:29:25 PM.    Final     PHYSICAL EXAM  Temp:  [98.2 F (36.8 C)-98.8 F (37.1 C)] 98.6 F (37 C) (08/13 0556) Pulse Rate:  [70-83] 76 (08/13 0556) Resp:  [15-20] 15 (08/13  0556) BP: (138-160)/(71-99) 160/99 (08/13 0556) SpO2:  [93 %-99 %] 93 % (08/13 0556) Weight:  [52 kg] 52 kg (08/12 1900)  General - Well nourished, well developed, mildly lethargic.  Ophthalmologic - fundi not visualized due to noncooperation.  Cardiovascular - irregularly irregular heart rate and rhythm.  Neuro - mildly lethargic, open eyes on voice, orientated to self, age,  people, time and place.  Following simple commands, paucity of speech, able to name 3/3 and able to repeat simple sentences.  PERRL, no significant gaze deviation, tracking bilaterally, visual field full.  Left nasolabial fold flattening, tongue midline.  Bilateral upper extremity 4/5, bilateral lower extremity 3/5 proximal and distal.  Sensation symmetrical, coordination FTN intact but slow, gait not tested.  DTR 1+, no Babinski.   Assessment:  Ms. [name] is a 85 y.o. female with history of posterior R temporal lobe infarct d/t AF w/ subtherapeutic INR on warfarin who was d/c to IP rehab where she developed 2 episode of altered mental status with R gaze preference and unresponsive state.  Concerning for seizure activity, treated with ativan and Keppra load.  EEG done showed no seizure.  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 No new finding.  Ongoing CIR therapy recommended  Atrial Fibrillation  Continue Eliquis (apixaban) 2.5 mg twice daily  Rate controlled  On metoprolol   Hypertension  BP 130-160s  On norvasc 5, toprol XL 25  BP goal  normotensive  Hyperlipidemia  Continue Lipitor 40  LDL 126, goal < 70  Continue statin on discharge  Diabetes type II, Uncontrolled  HgbA1c 7.4, goal < 7.0  On SSI  CBG monitoring  Home meds: metformin  Other Stroke Risk Factors  Advanced age  Hx ETOH use  Other Active Problems  Cognitive decline  Hospital day # 3  Neurology will sign off. Please call with questions. Pt will follow up with stroke clinic NP at [facility] in about 4 weeks. Thanks for the consult.  Rosalin Hawking, MD PhD Stroke Neurology 07/01/2019 11:02 AM    To contact Stroke Continuity provider, please refer to [email or web address] After hours, contact General Neurology

## 2019-07-02 ENCOUNTER — Inpatient Hospital Stay (HOSPITAL_COMMUNITY): Payer: Medicare Other | Admitting: Physical Therapy

## 2019-07-02 ENCOUNTER — Inpatient Hospital Stay (HOSPITAL_COMMUNITY): Payer: Medicare Other | Admitting: Speech Pathology

## 2019-07-02 ENCOUNTER — Inpatient Hospital Stay (HOSPITAL_COMMUNITY): Payer: Medicare Other

## 2019-07-02 ENCOUNTER — Encounter (HOSPITAL_COMMUNITY): Payer: Medicare Other

## 2019-07-02 LAB — BASIC METABOLIC PANEL
Anion gap: 10 (ref 5–15)
BUN: 19 mg/dL (ref 8–23)
CO2: 23 mmol/L (ref 22–32)
Calcium: 9 mg/dL (ref 8.9–10.3)
Chloride: 106 mmol/L (ref 98–111)
Creatinine, Ser: 0.81 mg/dL (ref 0.44–1.00)
GFR calc Af Amer: >60 mL/min (ref >60)
GFR calc non Af Amer: >60 mL/min (ref >60)
Glucose, Bld: 195 mg/dL — ABNORMAL HIGH (ref 70–99)
Potassium: 4.1 mmol/L (ref 3.5–5.1)
Sodium: 139 mmol/L (ref 135–145)

## 2019-07-02 LAB — GLUCOSE, CAPILLARY
Glucose-Capillary: 183 mg/dL — ABNORMAL HIGH (ref 70–99)
Glucose-Capillary: 269 mg/dL — ABNORMAL HIGH (ref 70–99)
Glucose-Capillary: 165 mg/dL — ABNORMAL HIGH (ref 70–99)
Glucose-Capillary: 136 mg/dL — ABNORMAL HIGH (ref 70–99)

## 2019-07-02 LAB — CBC
HCT: 48.1 % — ABNORMAL HIGH (ref 36.0–46.0)
Hemoglobin: 16.3 g/dL — ABNORMAL HIGH (ref 12.0–15.0)
MCH: 32.4 pg (ref 26.0–34.0)
MCHC: 33.9 g/dL (ref 30.0–36.0)
MCV: 95.6 fL (ref 80.0–100.0)
Platelets: 207 10*3/uL (ref 150–400)
RBC: 5.03 MIL/uL (ref 3.87–5.11)
RDW: 12.6 % (ref 11.5–15.5)
WBC: 10.7 10*3/uL — ABNORMAL HIGH (ref 4.0–10.5)
nRBC: 0 % (ref 0.0–0.2)

## 2019-07-02 MED ORDER — LEVETIRACETAM 250 MG PO TABS
250.0000 mg | ORAL_TABLET | Freq: Two times a day (BID) | ORAL | Status: DC
  Administered 2019-07-02 – 2019-07-13 (×22): 250 mg via ORAL
  Filled 2019-07-02 (×22): qty 1

## 2019-07-02 NOTE — Progress Notes (Signed)
Occupational Therapy Session Note  Patient Details  Name: [name] MRN: [id number] Date of Birth: 07/25/1928  Today's Date: 07/02/2019 OT Individual Time: 1400-1515 OT Individual Time Calculation (min): 75 min    Short Term Goals: Week 1:  OT Short Term Goal 1 (Week 1): Pt will transfer to [facility] with mod A OT Short Term Goal 2 (Week 1): Pt will don pants with mod A OT Short Term Goal 3 (Week 1): Pt will don shirt with min A OT Short Term Goal 4 (Week 1): Pt will require no more than mod cueing for focused attention to 1 grooming task  Skilled Therapeutic Interventions/Progress Updates:    1:1. Pt received in bed with no pain reported agreeale to shower. Pt completes stand pivot transfer with min-mod A overall throughout session with RW and grab bar to w/c and shower chair. tp requires VC for hand placement. Pt completes bathing with S for UB bathing and CGA for LB/peri care bathing in standing. Pt dresses with S for UB and MIN for LB to advance pants past hips. Pt able to don all footwear. Pt able to locate all items in far L of midline with increased time and no VC. Pt completes box and blocks RUE 16 and LUE 8. Short vision assessment determining decreased smoothness of pursuits tracking horizontally to L, and indicative of L visual field cut during peripheral vision test. Line bisection with 1 missed in L lower half of paper  Therapy Documentation Precautions:  Precautions Precautions: None Restrictions Weight Bearing Restrictions: No General:   Vital Signs: Therapy Vitals Temp: 98.4 F (36.9 C) Temp Source: Oral Pulse Rate: 85 Resp: 14 BP: (!) 152/68 Patient Position (if appropriate): Lying Oxygen Therapy SpO2: 99 % O2 Device: Room Air Pain: Pain Assessment Pain Scale: 0-10 Pain Score: 0-No pain ADL: ADL Eating: Set up, Supervision/safety Where Assessed-Eating: Bed level Grooming: Contact guard Where Assessed-Grooming: Edge of bed Upper Body Bathing: Minimal  assistance Where Assessed-Upper Body Bathing: Edge of bed Lower Body Bathing: Maximal assistance Where Assessed-Lower Body Bathing: Edge of bed Upper Body Dressing: Moderate assistance Where Assessed-Upper Body Dressing: Edge of bed Lower Body Dressing: Maximal assistance Where Assessed-Lower Body Dressing: Edge of bed Toileting: Maximal assistance Where Assessed-Toileting: Bedside Commode Toilet Transfer: Maximal assistance Toilet Transfer Method: Stand pivot Science writer: Extra wide bedside commode Vision   Perception    Praxis   Exercises:   Other Treatments:     Therapy/Group: Individual Therapy  Tonny Branch 07/02/2019, 3:18 PM

## 2019-07-02 NOTE — Progress Notes (Signed)
Pt still attempting to get out of bed and confused. Pt was brought to the nurses station to monitor, and has not been asleep since 0100. Pt at nurses station attempting to call different family members with personal cell phone. Pt personal glasses in other hand, refusing to sit them down.

## 2019-07-02 NOTE — Plan of Care (Signed)
  Problem: RH SKIN INTEGRITY Goal: RH STG SKIN FREE OF INFECTION/BREAKDOWN Outcome: Progressing   Problem: RH SAFETY Goal: RH STG ADHERE TO SAFETY PRECAUTIONS W/ASSISTANCE/DEVICE Description: STG Adhere to Safety Precautions With Min Assistance/Device. Outcome: Progressing   Problem: RH PAIN MANAGEMENT Goal: RH STG PAIN MANAGED AT OR BELOW PT'S PAIN GOAL Outcome: Progressing   Problem: RH KNOWLEDGE DEFICIT Goal: RH STG INCREASE KNOWLEDGE OF HYPERTENSION Outcome: Progressing

## 2019-07-02 NOTE — Progress Notes (Signed)
Physical Therapy Session Note  Patient Details  Name: [name] MRN: [id number] Date of Birth: [birth year]  Today's Date: 07/02/2019 PT Individual Time: 2336-1224 PT Individual Time Calculation (min): 70 min   Short Term Goals: Week 1:  PT Short Term Goal 1 (Week 1): Pt will participate in 30 min of OOB activity w/o increase in fatigue PT Short Term Goal 2 (Week 1): Pt will transfer sit<>stand w/ mod assist consistently PT Short Term Goal 3 (Week 1): Pt will perform bed mobility w/ mod assist  Skilled Therapeutic Interventions/Progress Updates:  Pt received in room, NT present, pt set up at sink brushing teeth from w/c level. Pt requires cuing to notice spillage onto lap. Pt demonstrates decreased attention to L and tangential conversation but is able to report she is in the hospital 2/2 stroke. Transported pt to/from gym via w/c dependent assist for time management. Pt transfers sit>stand with mod fade to min assist, stand sit with very poor eccentric control & cuing for hand placement. Pt ambulates 55 ft + 75 ft + 40 ft with RW & min assist with cuing to not push RW too far out in front & pt demonstrating forward flexed posture.  Pt engaged in dynavision 2 minutes + 2 minutes while standing with min assist and 1UE support on RW with task focusing on standing balance and scanning L. Pt initially reaching above light & requiring auditory cue to scan L but as task progressed pt able to press lights with improving accuracy & speed without cuing. Pt does demonstrate impaired shoulder flexion (R worse than L). In gym, pt transfers w/c<>mat via stand pivot with RW & min assist. Pt performs standing BLE exercises with UE support on RW. Exercises included marches, mini squats & heel raises with instructional cuing for technique; pt performs 2 sets x 10 reps of each with seated rest break between each set 2/2 fatigue. Pt performed standing step taps (3" step) with BUE support on RW & min assist for balance  with task focusing on BLE coordination & weight shifting for dynamic balance. At end of session, pt doffs shoes sitting EOB with supervision, and transitions sit>supine with supervision. Per pt's son, aside from veering L, pt's gait is her baseline. Pt left in bed with alarm set & call bell in reach, son present & telesitter in room.   Therapy Documentation Precautions:  Precautions Precautions: None Restrictions Weight Bearing Restrictions: No  Pain: Pt reports unrated back pain & fatigue but declines asking for pain meds at this time, rest breaks provided PRN.    Therapy/Group: Individual Therapy  [name] 07/02/2019, 11:17 AM

## 2019-07-02 NOTE — Progress Notes (Signed)
Pt continues to get out of bed, stating she is going to find her husband, bed alarm is on, bed in lowest position, floor mats on both sides, call light in reach, telesitter in place. Will continue to monitor.

## 2019-07-02 NOTE — Progress Notes (Signed)
[place] PHYSICAL MEDICINE & REHABILITATION PROGRESS NOTE   Subjective/Complaints: Pt in bed. Was restless again last night. Apparently called son confused. Better this morning  ROS: Patient denies fever, rash, sore throat, blurred vision, nausea, vomiting, diarrhea, cough, shortness of breath or chest pain, joint or back pain, headache, or mood change.    Objective:   No results found. Recent Labs    07/02/19 0635  WBC 10.7*  HGB 16.3*  HCT 48.1*  PLT 207   Recent Labs    07/02/19 0635  NA 139  K 4.1  CL 106  CO2 23  GLUCOSE 195*  BUN 19  CREATININE 0.81  CALCIUM 9.0    Intake/Output Summary (Last 24 hours) at 07/02/2019 0936 Last data filed at 07/01/2019 1730 Gross per 24 hour  Intake 220 ml  Output -  Net 220 ml     Physical Exam: Vital Signs Blood pressure (!) 149/96, pulse (!) 101, temperature 97.9 F (36.6 C), temperature source Oral, resp. rate 18, height 5\' 3"  (1.6 m), weight 52 kg, SpO2 97 %. Constitutional: No distress . Vital signs reviewed. HEENT: EOMI, oral membranes moist Neck: supple Cardiovascular: RRR without murmur. No JVD    Respiratory: CTA Bilaterally without wheezes or rales. Normal effort    GI: BS +, non-tender, non-distended     Skin: Warm and dry.  Intact. Psych: Normal mood.  Normal behavior. Musc: No edema.  No tenderness. Neurological: Alert, follows simple commands. Oriented to person, hospital, reason Left eye ptosis Motor:?  Limited due to participation RUE/RLE: Grossly 4-/5 LUE/LE: Grossly 4/5 Skin: Missing 3rd and 5th toes right foot and 3rd toe left foot.  Intact.   Psychiatric: pleasant and cooperative  Assessment/Plan: 1. Functional deficits secondary to right temporal lobe/PCA distribution infarct which require 3+ hours per day of interdisciplinary therapy in a comprehensive inpatient rehab setting.  Physiatrist is providing close team supervision and 24 hour management of active medical problems listed  below.  Physiatrist and rehab team continue to assess barriers to discharge/monitor patient progress toward functional and medical goals  Care Tool:  Bathing    Body parts bathed by patient: Right arm, Left arm, Chest, Abdomen, Right upper leg, Left upper leg, Face   Body parts bathed by helper: Buttocks, Front perineal area, Right lower leg, Left lower leg     Bathing assist Assist Level: Moderate Assistance - Patient 50 - 74%     Upper Body Dressing/Undressing Upper body dressing   What is the patient wearing?: Hospital gown only    Upper body assist Assist Level: Moderate Assistance - Patient 50 - 74%    Lower Body Dressing/Undressing Lower body dressing      What is the patient wearing?: Pants, Incontinence brief     Lower body assist Assist for lower body dressing: Maximal Assistance - Patient 25 - 49%     Toileting Toileting    Toileting assist Assist for toileting: Maximal Assistance - Patient 25 - 49%     Transfers Chair/bed transfer  Transfers assist  Chair/bed transfer activity did not occur: Safety/medical concerns  Chair/bed transfer assist level: Moderate Assistance - Patient 50 - 74%     Locomotion Ambulation   Ambulation assist   Ambulation activity did not occur: Safety/medical concerns  Assist level: Minimal Assistance - Patient > 75% Assistive device: Walker-rolling Max distance: 40   Walk 10 feet activity   Assist  Walk 10 feet activity did not occur: Safety/medical concerns  Assist level: Minimal Assistance -  Patient > 75% Assistive device: Walker-rolling   Walk 50 feet activity   Assist Walk 50 feet with 2 turns activity did not occur: Safety/medical concerns         Walk 150 feet activity   Assist Walk 150 feet activity did not occur: Safety/medical concerns         Walk 10 feet on uneven surface  activity   Assist Walk 10 feet on uneven surfaces activity did not occur: Safety/medical concerns          Wheelchair     Assist Will patient use wheelchair at discharge?: No   Wheelchair activity did not occur: N/A         Wheelchair 50 feet with 2 turns activity    Assist    Wheelchair 50 feet with 2 turns activity did not occur: N/A       Wheelchair 150 feet activity     Assist Wheelchair 150 feet activity did not occur: N/A        Medical Problem List and Plan: 1.Left-sided weakness with slurred speechsecondary to acute early subacute ischemia within the posterior right temporal lobe within the PCA territory. Continue CIR  Repeat head CT stable.  Likely seizures, broken with ativan, now on keppra 500mg  bid   -Neuro following. Appreciate assistance  EEG negative for seizures  -more confused since seizure and initiation of keppra--will speak to neuro about perhaps decreasing keppra or using a different agent   -spoke to son again today  2. Antithrombotics: -DVT/anticoagulation:Eliquis -antiplatelet therapy: Aspirin 325 mg daily 3. Pain Management:Tylenol as needed 4. Mood:Provide emotional support -antipsychotic agents: N/A 5. Neuropsych: This patientisnot capable of making decisions on herown behalf. 6. Skin/Wound Care:Routine skin checks 7. Fluids/Electrolytes/Nutrition:encourage PO  -I personally reviewed the patient's labs today.   8. PAF. Continue Eliquis    9. Hypertension.   Increased Toprol to 25mg  bid 8/12  Showing some improvement, avoid over-treating---continue to monitor 10. Diabetes mellitus with hyperglycemia. Hemoglobin A1c 7.4. SSI. Patient on Glucophage 500 mg twice daily prior to admission. Resume as needed based on trends  Still with Poor control. Increase glucophage to 500mg  bid 11. Hyperlipidemia. Lipitor 12.Peripheral vascular disease with history of left SFA angioplasty and stenting in the past. Continue low-dose aspirin 13.Decreased nutritional storage. Dietary  follow-up.  IV fluids. -protein supp 14.  Leukocytosis  WBCs 10.8 on 8/11  Continue to monitor   LOS: 4 days A FACE TO [place] 07/02/2019, 9:36 AM

## 2019-07-02 NOTE — Progress Notes (Signed)
Speech Language Pathology Daily Session Note  Patient Details  Name: [name] MRN: [id number] Date of Birth: 1928-06-03  Today's Date: 07/02/2019 SLP Individual Time: 2863-8177 SLP Individual Time Calculation (min): 45 min  Short Term Goals: Week 1: SLP Short Term Goal 1 (Week 1): Pt will tolerate current (dys2/thin) diet without overt s/s aspiration or decline in respiratory status SLP Short Term Goal 2 (Week 1): Pt will consistently demonstrate orientation x4 with mod A multimodal cues SLP Short Term Goal 3 (Week 1): Pt will sustain attention to functional task for 5 minutes with modA multimodal cues SLP Short Term Goal 4 (Week 1): Pt will demonstrate functional recall of daily events with modA multimodal cues SLP Short Term Goal 5 (Week 1): Pt will complete simple mental math problems with modA multimodal cues  Skilled Therapeutic Interventions: Pt was seen for skilled ST intervention targeting aforementioned goals. SLP facilitated session by providing money counting activity. Pt reported having difficulty focusing her vision (blurred vision). She reported blurred vision yesterday as well, during newspaper reading task. Pt was oriented to person, place, time (month/year, not day/date) and situation. She started the session with good attention and fair alertness, however, by the end of the session, pt required ongoing cues to maintain alertness and open her eyes. Pt was left in bed with alarm on, all needs within reach. Continue ST per current plan of care. MD was notified of pt report of blurred vision.  Pain Pain Assessment Pain Scale: 0-10 Pain Score: 0-No pain Faces Pain Scale: No hurt  Therapy/Group: Individual Therapy   Tobi Leinweber B. Quentin Ore, [facility], CCC-SLP Speech Language Pathologist  Shonna Chock 07/02/2019, 12:00 PM

## 2019-07-03 ENCOUNTER — Inpatient Hospital Stay (HOSPITAL_COMMUNITY): Payer: Medicare Other | Admitting: Speech Pathology

## 2019-07-03 ENCOUNTER — Inpatient Hospital Stay (HOSPITAL_COMMUNITY): Payer: Medicare Other | Admitting: Physical Therapy

## 2019-07-03 ENCOUNTER — Inpatient Hospital Stay (HOSPITAL_COMMUNITY): Payer: Medicare Other | Admitting: Occupational Therapy

## 2019-07-03 LAB — GLUCOSE, CAPILLARY
Glucose-Capillary: 143 mg/dL — ABNORMAL HIGH (ref 70–99)
Glucose-Capillary: 182 mg/dL — ABNORMAL HIGH (ref 70–99)
Glucose-Capillary: 195 mg/dL — ABNORMAL HIGH (ref 70–99)
Glucose-Capillary: 116 mg/dL — ABNORMAL HIGH (ref 70–99)

## 2019-07-03 NOTE — Progress Notes (Addendum)
Speech Language Pathology Daily Session Note  Patient Details  Name: [name] MRN: [id number] Date of Birth: 1928/06/29  Today's Date: 07/03/2019 SLP Individual Time: 0700-0800 SLP Individual Time Calculation (min): 60 min  Short Term Goals: Week 1: SLP Short Term Goal 1 (Week 1): Pt will tolerate current (dys2/thin) diet without overt s/s aspiration or decline in respiratory status SLP Short Term Goal 2 (Week 1): Pt will consistently demonstrate orientation x4 with mod A multimodal cues SLP Short Term Goal 3 (Week 1): Pt will sustain attention to functional task for 5 minutes with modA multimodal cues SLP Short Term Goal 4 (Week 1): Pt will demonstrate functional recall of daily events with modA multimodal cues SLP Short Term Goal 5 (Week 1): Pt will complete simple mental math problems with modA multimodal cues  Skilled Therapeutic Interventions: Patient received skilled SLP services targeting cognitive goals. Patient was alert and oriented to person, month/year, place, and situation independently. Patient participated in a functional math task calculating the values of coins and bills with 75% accuracy requiring max verbal cues. Patient required max verbal cues to attend to task of eating breakfast of eggs and oatmeal for 10 minutes. Despite max verbal and visual cues patient was unable to recall 3 tasks she completed with therapy yesterday. Patient was unable to utilize visual schedule as a recall for the times she has therapy today. At the end of therapy session son was present at bedside. Son was educated on patient's therapy goals. He verbalized understanding. At the end of therapy session patient was upright in bed, bed alarm activated, and son present.  Pain Pain Assessment Pain Scale: 0-10 Pain Score: 0-No pain  Therapy/Group: Individual Therapy  Cristy Folks, [place] 07/03/2019, 8:04 AM

## 2019-07-03 NOTE — Progress Notes (Signed)
Physical Therapy Session Note  Patient Details  Name: KATRYNA TSCHIRHART MRN: [id number] Date of Birth: Feb 22, 1928  Today's Date: 07/03/2019 PT Individual Time: 0901-0957 PT Individual Time Calculation (min): 56 min   Short Term Goals: Week 1:  PT Short Term Goal 1 (Week 1): Pt will participate in 30 min of OOB activity w/o increase in fatigue PT Short Term Goal 2 (Week 1): Pt will transfer sit<>stand w/ mod assist consistently PT Short Term Goal 3 (Week 1): Pt will perform bed mobility w/ mod assist  Skilled Therapeutic Interventions/Progress Updates:  Pt was seen bedside in the am with pt's son at bedside. Pt transferred supine to edge of bed with min A and verbal cues. Pt transferred sit to stand with rolling walker and min A. Pt ambulated about 4 feet to w/c with rolling walker and min A. Pt transported to rehab gym. Pt performed multiple sit to stand and stand pivot transfers with rolling walker and c/g to min A. Pt performed step taps and alternating step taps, 3 sets x 10 reps each for NMR. Pt ambulated 50 feet x 2 with rolling walker and c/g to min A with verbal cues. Pt returned to room following treatment and left sitting up in w/c with chair alarm on and son at bedside.   Therapy Documentation Precautions:  Precautions Precautions: None Restrictions Weight Bearing Restrictions: No General:   Pain: No c/o pain.   Therapy/Group: Individual Therapy  Dub Amis 07/03/2019, 12:11 PM

## 2019-07-03 NOTE — Progress Notes (Signed)
Occupational Therapy Session Note  Patient Details  Name: [name] MRN: [id number] Date of Birth: October 31, 1928  Today's Date: 07/03/2019 OT Individual Time: 1420-1536 OT Individual Time Calculation (min): 76 min   Short Term Goals: Week 1:  OT Short Term Goal 1 (Week 1): Pt will transfer to [facility] with mod A OT Short Term Goal 2 (Week 1): Pt will don pants with mod A OT Short Term Goal 3 (Week 1): Pt will don shirt with min A OT Short Term Goal 4 (Week 1): Pt will require no more than mod cueing for focused attention to 1 grooming task    Skilled Therapeutic Interventions/Progress Updates:    Pt greeted in w/c with no c/o pain. Declining shower, but amenable to completing bathing/dressing w/c level at the sink. Supervision for UB self care, including donning overhead shirt. Min vcs for clothing orientation. Min-Mod A for sit<stand with vcs for hand placement. Pt able to complete perihygiene and elevate pants over hips with steady assist for balance. The same balance assist provided when she completed oral care and grooming tasks in standing after. Min A for donning shoes. She declined participation in toileting, opting instead to ambulate. She ambulated 78 ft x2 down hallway in mostly a straight path, 1 turn. Multimodal cues for staying inside of walker vs holding device far away. OT adjusted height of RW which appeared to improve technique during 3rd bout where she ambulated 88 ft with Min A. At end of session pt was returned to room and left in w/c with all needs within reach, safety belt fastened, and son present.   Per son, pt has a walk in shower with level entry and a seat. They plan to have 24/7 assist, provided by family and hired help.   Therapy Documentation Precautions:  Precautions Precautions: None Restrictions Weight Bearing Restrictions: No Vital Signs: Therapy Vitals Temp: (!) 97.5 F (36.4 C) Pulse Rate: 71 Resp: 17 BP: (!) 159/90 Patient Position (if appropriate):  Lying Oxygen Therapy SpO2: 95 % O2 Device: Room Air ADL: ADL Eating: Set up, Supervision/safety Where Assessed-Eating: Bed level Grooming: Contact guard Where Assessed-Grooming: Edge of bed Upper Body Bathing: Minimal assistance Where Assessed-Upper Body Bathing: Edge of bed Lower Body Bathing: Maximal assistance Where Assessed-Lower Body Bathing: Edge of bed Upper Body Dressing: Moderate assistance Where Assessed-Upper Body Dressing: Edge of bed Lower Body Dressing: Maximal assistance Where Assessed-Lower Body Dressing: Edge of bed Toileting: Maximal assistance Where Assessed-Toileting: Bedside Commode Toilet Transfer: Maximal assistance Toilet Transfer Method: Stand pivot Toilet Transfer Equipment: Extra wide bedside commode      Therapy/Group: Individual Therapy  Jahmir Salo A Raffael Bugarin 07/03/2019, 4:29 PM

## 2019-07-03 NOTE — Progress Notes (Signed)
[place] PHYSICAL MEDICINE & REHABILITATION PROGRESS NOTE   Subjective/Complaints: No issues overnite, son at bedside , awaiting lunch tray  ROS: Patient denies  nausea, vomiting, diarrhea, cough, shortness of breath or chest pain, joint or back pain, headache, or mood change.    Objective:   No results found. Recent Labs    07/02/19 0635  WBC 10.7*  HGB 16.3*  HCT 48.1*  PLT 207   Recent Labs    07/02/19 0635  NA 139  K 4.1  CL 106  CO2 23  GLUCOSE 195*  BUN 19  CREATININE 0.81  CALCIUM 9.0    Intake/Output Summary (Last 24 hours) at 07/03/2019 1316 Last data filed at 07/03/2019 0815 Gross per 24 hour  Intake 356 ml  Output -  Net 356 ml     Physical Exam: Vital Signs Blood pressure (!) 157/90, pulse 78, temperature (!) 97.5 F (36.4 C), temperature source Oral, resp. rate 16, height 5\' 3"  (1.6 m), weight 52 kg, SpO2 99 %. Constitutional: No distress . Vital signs reviewed. HEENT: EOMI, oral membranes moist Neck: supple Cardiovascular: RRR without murmur. No JVD    Respiratory: CTA Bilaterally without wheezes or rales. Normal effort    GI: BS +, non-tender, non-distended     Skin: Warm and dry.  Intact. Psych: Normal mood.  Normal behavior. Musc: No edema.  No tenderness. Neurological: Alert, follows simple commands. Oriented to person, hospital, reason Left eye ptosis Motor:  Limited due to participation RUE/RLE: Grossly 4/5 LUE/LE: 4/5 Mild left neglect Skin: Missing 3rd and 5th toes right foot and 3rd toe left foot.  Intact.   Psychiatric: pleasant and cooperative  Assessment/Plan: 1. Functional deficits secondary to right temporal lobe/PCA distribution infarct which require 3+ hours per day of interdisciplinary therapy in a comprehensive inpatient rehab setting.  Physiatrist is providing close team supervision and 24 hour management of active medical problems listed below.  Physiatrist and rehab team continue to assess barriers to  discharge/monitor patient progress toward functional and medical goals  Care Tool:  Bathing    Body parts bathed by patient: Right arm, Left arm, Chest, Abdomen, Right upper leg, Left upper leg, Face, Front perineal area, Buttocks, Right lower leg, Left lower leg   Body parts bathed by helper: Buttocks, Front perineal area, Right lower leg, Left lower leg     Bathing assist Assist Level: Contact Guard/Touching assist     Upper Body Dressing/Undressing Upper body dressing   What is the patient wearing?: Pull over shirt    Upper body assist Assist Level: Supervision/Verbal cueing    Lower Body Dressing/Undressing Lower body dressing      What is the patient wearing?: Pants, Incontinence brief     Lower body assist Assist for lower body dressing: Moderate Assistance - Patient 50 - 74%     Toileting Toileting    Toileting assist Assist for toileting: Maximal Assistance - Patient 25 - 49%     Transfers Chair/bed transfer  Transfers assist  Chair/bed transfer activity did not occur: Safety/medical concerns  Chair/bed transfer assist level: Minimal Assistance - Patient > 75%     Locomotion Ambulation   Ambulation assist   Ambulation activity did not occur: Safety/medical concerns  Assist level: Minimal Assistance - Patient > 75% Assistive device: Walker-rolling Max distance: 50   Walk 10 feet activity   Assist  Walk 10 feet activity did not occur: Safety/medical concerns  Assist level: Minimal Assistance - Patient > 75% Assistive device: Walker-rolling  Walk 50 feet activity   Assist Walk 50 feet with 2 turns activity did not occur: Safety/medical concerns  Assist level: Minimal Assistance - Patient > 75% Assistive device: Walker-rolling    Walk 150 feet activity   Assist Walk 150 feet activity did not occur: Safety/medical concerns         Walk 10 feet on uneven surface  activity   Assist Walk 10 feet on uneven surfaces activity did  not occur: Safety/medical concerns         Wheelchair     Assist Will patient use wheelchair at discharge?: No   Wheelchair activity did not occur: N/A         Wheelchair 50 feet with 2 turns activity    Assist    Wheelchair 50 feet with 2 turns activity did not occur: N/A       Wheelchair 150 feet activity     Assist Wheelchair 150 feet activity did not occur: N/A        Medical Problem List and Plan: 1.Left-sided weakness with slurred speechsecondary to acute early subacute ischemia within the posterior right temporal lobe within the PCA territory. Continue CIR  Repeat head CT stable.  Likely seizures, broken with ativan, now on keppra 500mg  bid   -Neuro following. Appreciate assistance  EEG negative for seizures  -son at bedside pleased with progress  2. Antithrombotics: -DVT/anticoagulation:Eliquis -antiplatelet therapy: Aspirin 325 mg daily 3. Pain Management:Tylenol as needed 4. Mood:Provide emotional support -antipsychotic agents: N/A 5. Neuropsych: This patientisnot capable of making decisions on herown behalf. 6. Skin/Wound Care:Routine skin checks 7. Fluids/Electrolytes/Nutrition:encourage PO  -I personally reviewed the patient's labs today.   8. PAF. Continue Eliquis    9. Hypertension.   Increased Toprol to 25mg  bid 8/12 Vitals:   07/02/19 2002 07/03/19 0628  BP: (!) 157/75 (!) 157/90  Pulse: 67 78  Resp: 16 16  Temp: 97.7 F (36.5 C) (!) 97.5 F (36.4 C)  SpO2: 63% 84%  Systolic HTN will increase toprol if no improvement  10. Diabetes mellitus with hyperglycemia. Hemoglobin A1c 7.4. SSI. Patient on Glucophage 500 mg twice daily prior to admission. Resume as needed based on trends  Still with Poor control. Increase glucophage to 500mg  bid 11. Hyperlipidemia. Lipitor 12.Peripheral vascular disease with history of left SFA angioplasty and stenting in the past. Continue  low-dose aspirin 13.Decreased nutritional storage. Dietary follow-up.  IV fluids. -protein supp 14.  Leukocytosis  WBCs 10.8 on 8/11  Continue to monitor   LOS: 5 days A FACE TO [place] 07/03/2019, 1:16 PM

## 2019-07-04 LAB — GLUCOSE, CAPILLARY
Glucose-Capillary: 157 mg/dL — ABNORMAL HIGH (ref 70–99)
Glucose-Capillary: 220 mg/dL — ABNORMAL HIGH (ref 70–99)
Glucose-Capillary: 116 mg/dL — ABNORMAL HIGH (ref 70–99)
Glucose-Capillary: 224 mg/dL — ABNORMAL HIGH (ref 70–99)

## 2019-07-04 MED ORDER — AMLODIPINE BESYLATE 10 MG PO TABS
10.0000 mg | ORAL_TABLET | Freq: Every day | ORAL | Status: DC
  Administered 2019-07-05 – 2019-07-13 (×9): 10 mg via ORAL
  Filled 2019-07-04 (×9): qty 1

## 2019-07-04 MED ORDER — ALPRAZOLAM 0.25 MG PO TABS
0.2500 mg | ORAL_TABLET | Freq: Four times a day (QID) | ORAL | Status: DC | PRN
  Administered 2019-07-04 – 2019-07-11 (×5): 0.25 mg via ORAL
  Filled 2019-07-04 (×5): qty 1

## 2019-07-04 NOTE — Progress Notes (Addendum)
[facility] PHYSICAL MEDICINE & REHABILITATION PROGRESS NOTE   Subjective/Complaints: Son feels that the patient is a little bit more confused today.  The patient has no complaints today.  She did states she did not sleep well.  She could not indicate why she had problems sleeping.  Denied any pains at night or breathing problems or bowel or bladder problems  ROS: Patient denies  nausea, vomiting, diarrhea, cough, shortness of breath or chest pain, joint or back pain, headache, or mood change.    Objective:   No results found. Recent Labs    07/02/19 0635  WBC 10.7*  HGB 16.3*  HCT 48.1*  PLT 207   Recent Labs    07/02/19 0635  NA 139  K 4.1  CL 106  CO2 23  GLUCOSE 195*  BUN 19  CREATININE 0.81  CALCIUM 9.0    Intake/Output Summary (Last 24 hours) at 07/04/2019 1308 Last data filed at 07/04/2019 0909 Gross per 24 hour  Intake 517 ml  Output -  Net 517 ml     Physical Exam: Vital Signs Blood pressure (!) 173/79, pulse 69, temperature 97.7 F (36.5 C), resp. rate 12, height 5\' 3"  (1.6 m), weight 52 kg, SpO2 95 %. Constitutional: No distress . Vital signs reviewed. HEENT: EOMI, oral membranes moist Neck: supple Cardiovascular: RRR without murmur. No JVD    Respiratory: CTA Bilaterally without wheezes or rales. Normal effort    GI: BS +, non-tender, non-distended     Skin: Warm and dry.  Intact. Psych: Normal mood.  Normal behavior. Musc: No edema.  No tenderness. Neurological: Alert, follows simple commands. Oriented to person, hospital, reason Left eye ptosis Motor:  Limited due to participation RUE/RLE: Grossly 4/5 LUE/LE: 4/5 Mild left neglect Skin: Missing 3rd and 5th toes right foot and 3rd toe left foot.  Intact.   Psychiatric: pleasant and cooperative  Assessment/Plan: 1. Functional deficits secondary to right temporal lobe/PCA distribution infarct which require 3+ hours per day of interdisciplinary therapy in a comprehensive inpatient rehab  setting.  Physiatrist is providing close team supervision and 24 hour management of active medical problems listed below.  Physiatrist and rehab team continue to assess barriers to discharge/monitor patient progress toward functional and medical goals  Care Tool:  Bathing    Body parts bathed by patient: Right arm, Left arm, Chest, Abdomen, Right upper leg, Left upper leg, Face, Front perineal area, Buttocks, Right lower leg, Left lower leg   Body parts bathed by helper: Left lower leg, Right lower leg     Bathing assist Assist Level: Minimal Assistance - Patient > 75%     Upper Body Dressing/Undressing Upper body dressing   What is the patient wearing?: Pull over shirt    Upper body assist Assist Level: Supervision/Verbal cueing    Lower Body Dressing/Undressing Lower body dressing      What is the patient wearing?: Pants, Incontinence brief     Lower body assist Assist for lower body dressing: Moderate Assistance - Patient 50 - 74%     Toileting Toileting Toileting Activity did not occur Landscape architect and hygiene only): Refused  Toileting assist Assist for toileting: Moderate Assistance - Patient 50 - 74%     Transfers Chair/bed transfer  Transfers assist  Chair/bed transfer activity did not occur: Safety/medical concerns  Chair/bed transfer assist level: Minimal Assistance - Patient > 75%     Locomotion Ambulation   Ambulation assist   Ambulation activity did not occur: Safety/medical concerns  Assist  level: Minimal Assistance - Patient > 75% Assistive device: Walker-rolling Max distance: 50   Walk 10 feet activity   Assist  Walk 10 feet activity did not occur: Safety/medical concerns  Assist level: Minimal Assistance - Patient > 75% Assistive device: Walker-rolling   Walk 50 feet activity   Assist Walk 50 feet with 2 turns activity did not occur: Safety/medical concerns  Assist level: Minimal Assistance - Patient > 75% Assistive  device: Walker-rolling    Walk 150 feet activity   Assist Walk 150 feet activity did not occur: Safety/medical concerns         Walk 10 feet on uneven surface  activity   Assist Walk 10 feet on uneven surfaces activity did not occur: Safety/medical concerns         Wheelchair     Assist Will patient use wheelchair at discharge?: No   Wheelchair activity did not occur: N/A         Wheelchair 50 feet with 2 turns activity    Assist    Wheelchair 50 feet with 2 turns activity did not occur: N/A       Wheelchair 150 feet activity     Assist Wheelchair 150 feet activity did not occur: N/A        Medical Problem List and Plan: 1.Left-sided weakness with slurred speechsecondary to acute early subacute ischemia within the posterior right temporal lobe within the PCA territory. Continue CIR  Repeat head CT stable.  Likely seizures, broken with ativan, now on keppra 500mg  bid   -Neuro following. Appreciate assistance  EEG negative for seizures  -son at bedside, discussed role of sleep and mental awareness.  We also discussed that even with medication changes it may take several days for medications to washout  2. Antithrombotics: -DVT/anticoagulation:Eliquis -antiplatelet therapy: Aspirin 325 mg daily 3. Pain Management:Tylenol as needed 4. Mood:Provide emotional support -antipsychotic agents: N/A 5. Neuropsych: This patientisnot capable of making decisions on herown behalf. 6. Skin/Wound Care:Routine skin checks 7. Fluids/Electrolytes/Nutrition:encourage PO  -I personally reviewed the patient's labs today.   8. PAF. Continue Eliquis    9. Hypertension.   Increased Toprol to 25mg  bid 8/12 Vitals:   07/03/19 2014 07/04/19 0357  BP: (!) 147/82 (!) 173/79  Pulse: 72 69  Resp: 16 12  Temp: (!) 97.5 F (36.4 C) 97.7 F (36.5 C)  SpO2: 75% 44%  Systolic HTN we will increase amlodipine to 10  mg 10. Diabetes mellitus with hyperglycemia. Hemoglobin A1c 7.4. SSI. Patient on Glucophage 500 mg twice daily prior to admission. Resume as needed based on trends  Still with Poor control. Increase glucophage to 500mg  bid 11. Hyperlipidemia. Lipitor 12.Peripheral vascular disease with history of left SFA angioplasty and stenting in the past. Continue low-dose aspirin 13.Decreased nutritional storage. Dietary follow-up.  IV fluids. -protein supp 14.  Leukocytosis  WBCs 10.8 on 8/11  Continue to monitor   LOS: 6 days A FACE TO FACE EVALUATION WAS PERFORMED  Charlett Blake 07/04/2019, 1:08 PM

## 2019-07-05 ENCOUNTER — Ambulatory Visit: Payer: Medicare Other | Admitting: Podiatry

## 2019-07-05 ENCOUNTER — Inpatient Hospital Stay (HOSPITAL_COMMUNITY): Payer: Medicare Other

## 2019-07-05 ENCOUNTER — Inpatient Hospital Stay (HOSPITAL_COMMUNITY): Payer: Medicare Other | Admitting: Speech Pathology

## 2019-07-05 ENCOUNTER — Inpatient Hospital Stay (HOSPITAL_COMMUNITY): Payer: Medicare Other | Admitting: Physical Therapy

## 2019-07-05 DIAGNOSIS — R569 Unspecified convulsions: Secondary | ICD-10-CM

## 2019-07-05 LAB — GLUCOSE, CAPILLARY
Glucose-Capillary: 154 mg/dL — ABNORMAL HIGH (ref 70–99)
Glucose-Capillary: 223 mg/dL — ABNORMAL HIGH (ref 70–99)
Glucose-Capillary: 152 mg/dL — ABNORMAL HIGH (ref 70–99)
Glucose-Capillary: 162 mg/dL — ABNORMAL HIGH (ref 70–99)

## 2019-07-05 NOTE — Plan of Care (Signed)
  Problem: Consults Goal: RH STROKE PATIENT EDUCATION Description: See Patient Education module for education specifics  Outcome: Progressing Goal: Nutrition Consult-if indicated Outcome: Progressing Goal: Diabetes Guidelines if Diabetic/Glucose > 140 Description: If diabetic or lab glucose is > 140 mg/dl - Initiate Diabetes/Hyperglycemia Guidelines & Document Interventions  Outcome: Progressing   Problem: RH BOWEL ELIMINATION Goal: RH STG MANAGE BOWEL WITH ASSISTANCE Description: STG Manage Bowel with Min Assistance. Outcome: Progressing Flowsheets (Taken 07/05/2019 1240) STG: Pt will manage bowels with assistance: 4-Minimum assistance Goal: RH STG MANAGE BOWEL W/MEDICATION W/ASSISTANCE Description: STG Manage Bowel with Medication with [place]. Outcome: Progressing Flowsheets (Taken 07/05/2019 1240) STG: Pt will manage bowels with medication with assistance: 4-Minimal assistance   Problem: RH BLADDER ELIMINATION Goal: RH STG MANAGE BLADDER WITH ASSISTANCE Description: STG Manage Bladder With Min Assistance Outcome: Progressing Flowsheets (Taken 07/05/2019 1240) STG: Pt will manage bladder with assistance: 4-Minimal assistance   Problem: RH SKIN INTEGRITY Goal: RH STG SKIN FREE OF INFECTION/BREAKDOWN Outcome: Progressing Goal: RH STG MAINTAIN SKIN INTEGRITY WITH ASSISTANCE Description: STG Maintain Skin Integrity With [place]. Outcome: Progressing Flowsheets (Taken 07/05/2019 1240) STG: Maintain skin integrity with assistance: 5-Supervision/cueing Goal: RH STG ABLE TO PERFORM INCISION/WOUND CARE W/ASSISTANCE Description: STG Able To Perform Incision/Wound Care With [employer]. Outcome: Progressing   Problem: RH SAFETY Goal: RH STG ADHERE TO SAFETY PRECAUTIONS W/ASSISTANCE/DEVICE Description: STG Adhere to Safety Precautions With Min Assistance/Device. Outcome: Progressing Flowsheets (Taken 07/05/2019 1240) STG:Pt will adhere to safety precautions with  assistance/device: 4-Minimal assistance   Problem: RH COGNITION-NURSING Goal: RH STG USES MEMORY AIDS/STRATEGIES W/ASSIST TO PROBLEM SOLVE Description: STG Uses Memory Aids/Strategies With Assistance to Problem Solve. Outcome: Progressing Flowsheets (Taken 07/05/2019 1240) STG: Uses memory aids/strategies with assistance: 4-Minimal assistance   Problem: RH PAIN MANAGEMENT Goal: RH STG PAIN MANAGED AT OR BELOW PT'S PAIN GOAL Outcome: Progressing   Problem: RH KNOWLEDGE DEFICIT Goal: RH STG INCREASE KNOWLEDGE OF DIABETES Outcome: Progressing Goal: RH STG INCREASE KNOWLEDGE OF HYPERTENSION Outcome: Progressing

## 2019-07-05 NOTE — Progress Notes (Addendum)
Physical Therapy Weekly Progress Note  Patient Details  Name: [name] MRN: [id number] Date of Birth: 02/18/1928  Beginning of progress report period: June 30, 2019 End of progress report period: July 05, 2019  Today's Date: 07/05/2019   Patient has met 2 of 3 short term goals.  Pt is making progress towards LTG's as she currently requires min assist overall and is using RW for gait. Pt's son reports pt's gait pattern & posture is her baseline. Pt continues to demonstrate decreased attention to L. Pt would benefit from continued skilled PT treatment to focus on bed mobility, transfers, gait, and stair negotiation, as well as strengthening, endurance & balance training to increase her independence with functional mobility & reduce fall risk.   Patient continues to demonstrate the following deficits muscle weakness, decreased cardiorespiratoy endurance, decreased coordination, decreased visual acuity and decreased visual perceptual skills, decreased attention to left, decreased attention, decreased awareness, decreased problem solving, decreased safety awareness, decreased memory and delayed processing, peripheral and decreased standing balance, decreased postural control and decreased balance strategies and therefore will continue to benefit from skilled PT intervention to increase functional independence with mobility.  Patient progressing toward long term goals..  Plan of care revisions: gait & transfer goals upgraded to CGA.  PT Short Term Goals Week 1:  PT Short Term Goal 1 (Week 1): Pt will participate in 30 min of OOB activity w/o increase in fatigue PT Short Term Goal 1 - Progress (Week 1): Progressing toward goal PT Short Term Goal 2 (Week 1): Pt will transfer sit<>stand w/ mod assist consistently PT Short Term Goal 2 - Progress (Week 1): Met PT Short Term Goal 3 (Week 1): Pt will perform bed mobility w/ mod assist PT Short Term Goal 3 - Progress (Week 1): Met Week 2:  PT Short  Term Goal 1 (Week 2): STG = LTG due to ELOS.   Therapy Documentation Precautions:  Precautions Precautions: None Restrictions Weight Bearing Restrictions: No   Therapy/Group: Individual Therapy  Victoria M Miller 07/05/2019, 12:35 PM  

## 2019-07-05 NOTE — Progress Notes (Signed)
Physical Therapy Session Note  Patient Details  Name: [name] MRN: [id number] Date of Birth: [birth year]  Today's Date: 07/05/2019 PT Individual Time: 6967-8938 PT Individual Time Calculation (min): 42 min   Short Term Goals: Week 1:  PT Short Term Goal 1 (Week 1): Pt will participate in 30 min of OOB activity w/o increase in fatigue PT Short Term Goal 1 - Progress (Week 1): Progressing toward goal PT Short Term Goal 2 (Week 1): Pt will transfer sit<>stand w/ mod assist consistently PT Short Term Goal 2 - Progress (Week 1): Met PT Short Term Goal 3 (Week 1): Pt will perform bed mobility w/ mod assist PT Short Term Goal 3 - Progress (Week 1): Met  Skilled Therapeutic Interventions/Progress Updates:  Pt received in recliner with son exiting upon PT arrival. Pt reports she's "not feeling too good" and reports being tired but denies pain. Pt agreeable to tx. Pt transfers sit>stand from recliner/wheelchair with armrests & min assist. Pt ambulates room>dayroom (~150 ft) with RW & CGA with thoracic kyphosis and cuing to ambulate within base of RW but fair/poor return demo. Transported pt to dayroom where she completed ambulatory car transfer with RW & min assist at sedan simulated height with cuing to turn to square up to seat and reach back/push from seat with UE. Pt transferred w/c<>nu-step with RW & min assist with impaired safety awareness. Pt utilize nu-step on level 2 x 8 minutes with all four extremities with task focusing on midline orientation while sitting as pt leans L with impaired ability to recognize & correct, global strengthening & endurance training, & L NMR. At end of session pt left in recliner with call bell in lap & son present in room; he is aware pt is not to get up without staff assistance.  Pt's son reports she will ride home in an SUV (they only have SUV's or trucks). Asked him to measure SUV seat height so pt can practice prior to d/c.   Therapy  Documentation Precautions:  Precautions Precautions: None Restrictions Weight Bearing Restrictions: No   [name] 07/05/2019, 2:48 PM

## 2019-07-05 NOTE — Progress Notes (Signed)
[facility] PHYSICAL MEDICINE & REHABILITATION PROGRESS NOTE   Subjective/Complaints: Ongoing confusion over the weekend despite lowering keppra. Had a fair night.   ROS: Limited due to cognitive/behavioral    Objective:   No results found. No results for input(s): WBC, HGB, HCT, PLT in the last 72 hours. No results for input(s): NA, K, CL, CO2, GLUCOSE, BUN, CREATININE, CALCIUM in the last 72 hours.  Intake/Output Summary (Last 24 hours) at 07/05/2019 0921 Last data filed at 07/05/2019 0843 Gross per 24 hour  Intake 657 ml  Output -  Net 657 ml     Physical Exam: Vital Signs Blood pressure (!) 166/97, pulse 69, temperature (!) 97.5 F (36.4 C), resp. rate 18, height 5\' 3"  (1.6 m), weight 52 kg, SpO2 98 %. Constitutional: No distress . Vital signs reviewed. HEENT: EOMI, oral membranes moist Neck: supple Cardiovascular: RRR without murmur. No JVD    Respiratory: CTA Bilaterally without wheezes or rales. Normal effort    GI: BS +, non-tender, non-distended  Skin: Warm and dry.  Intact. Psych: Normal mood.  Normal behavior. Musc: No edema.  No tenderness. Neurological: oriented to person, hospital Alert, follows simple commands. Oriented to person, hospital, reason Left eye ptosis Motor:  Limited due to participation RUE/RLE: Grossly 4/5 LUE/LE: 4/5 Mild left neglect but can attend to left with cueing Skin: Missing 3rd and 5th toes right foot and 3rd toe left foot.  No changes Psychiatric: pleasant and cooperative  Assessment/Plan: 1. Functional deficits secondary to right temporal lobe/PCA distribution infarct which require 3+ hours per day of interdisciplinary therapy in a comprehensive inpatient rehab setting.  Physiatrist is providing close team supervision and 24 hour management of active medical problems listed below.  Physiatrist and rehab team continue to assess barriers to discharge/monitor patient progress toward functional and medical goals  Care  Tool:  Bathing    Body parts bathed by patient: Right arm, Left arm, Chest, Abdomen, Right upper leg, Left upper leg, Face, Front perineal area, Buttocks, Right lower leg, Left lower leg   Body parts bathed by helper: Left lower leg, Right lower leg     Bathing assist Assist Level: Minimal Assistance - Patient > 75%     Upper Body Dressing/Undressing Upper body dressing   What is the patient wearing?: Pull over shirt    Upper body assist Assist Level: Supervision/Verbal cueing    Lower Body Dressing/Undressing Lower body dressing      What is the patient wearing?: Pants, Incontinence brief     Lower body assist Assist for lower body dressing: Moderate Assistance - Patient 50 - 74%     Toileting Toileting Toileting Activity did not occur Landscape architect and hygiene only): Refused  Toileting assist Assist for toileting: Moderate Assistance - Patient 50 - 74%     Transfers Chair/bed transfer  Transfers assist  Chair/bed transfer activity did not occur: Safety/medical concerns  Chair/bed transfer assist level: Minimal Assistance - Patient > 75%     Locomotion Ambulation   Ambulation assist   Ambulation activity did not occur: Safety/medical concerns  Assist level: Minimal Assistance - Patient > 75% Assistive device: Walker-rolling Max distance: 50   Walk 10 feet activity   Assist  Walk 10 feet activity did not occur: Safety/medical concerns  Assist level: Minimal Assistance - Patient > 75% Assistive device: Walker-rolling   Walk 50 feet activity   Assist Walk 50 feet with 2 turns activity did not occur: Safety/medical concerns  Assist level: Minimal Assistance - Patient >  75% Assistive device: Walker-rolling    Walk 150 feet activity   Assist Walk 150 feet activity did not occur: Safety/medical concerns         Walk 10 feet on uneven surface  activity   Assist Walk 10 feet on uneven surfaces activity did not occur: Safety/medical  concerns         Wheelchair     Assist Will patient use wheelchair at discharge?: No   Wheelchair activity did not occur: N/A         Wheelchair 50 feet with 2 turns activity    Assist    Wheelchair 50 feet with 2 turns activity did not occur: N/A       Wheelchair 150 feet activity     Assist Wheelchair 150 feet activity did not occur: N/A        Medical Problem List and Plan: 1.Left-sided weakness with slurred speechsecondary to acute early subacute ischemia within the posterior right temporal lobe within the PCA territory. Continue CIR  Repeat head CT stable.  Likely seizures, broken with ativan, now on keppra 500mg  bid   -Neuro following. Appreciate assistance  EEG negative for seizures  -discussed mental status with son. Given cva, seizures, keppra we shouldn't be totally surprised. Would like to give her a couple more days.   2. Antithrombotics: -DVT/anticoagulation:Eliquis -antiplatelet therapy: Aspirin 325 mg daily 3. Pain Management:Tylenol as needed 4. Mood:Provide emotional support -antipsychotic agents: N/A 5. Neuropsych: This patientisnot capable of making decisions on herown behalf. 6. Skin/Wound Care:Routine skin checks 7. Fluids/Electrolytes/Nutrition:encourage PO     8. PAF. Continue Eliquis    9. Hypertension.   Increased Toprol to 25mg  bid 8/12 Vitals:   07/04/19 1930 07/05/19 0409  BP: (!) 146/66 (!) 166/97  Pulse: 74 69  Resp: 20 18  Temp: 97.8 F (36.6 C) (!) 97.5 F (36.4 C)  SpO2: 115% 72%  Systolic HTN increased amlodipine to 10 mg today 10. Diabetes mellitus with hyperglycemia. Hemoglobin A1c 7.4. SSI. Patient on Glucophage 500 mg twice daily prior to admission.   Still with Poor control. Increased glucophage to 500mg  bid with some improvement.   Cover with ssi for now.  -may need further coverage 11. Hyperlipidemia. Lipitor 12.Peripheral vascular disease  with history of left SFA angioplasty and stenting in the past. Continue low-dose aspirin 13.Decreased nutritional storage. Dietary follow-up.  IV fluids. -protein supp 14.  Leukocytosis  WBCs 10.8 on 8/11  Continue to monitor   LOS: 7 days A FACE TO [place] 07/05/2019, 9:21 AM

## 2019-07-05 NOTE — Progress Notes (Signed)
Speech Language Pathology Daily Session Note  Patient Details  Name: [name] MRN: [id number] Date of Birth: 05-01-1928  Today's Date: 07/05/2019 SLP Individual Time: 0725-0820 SLP Individual Time Calculation (min): 55 min  Short Term Goals: Week 1: SLP Short Term Goal 1 (Week 1): Pt will tolerate current (dys2/thin) diet without overt s/s aspiration or decline in respiratory status SLP Short Term Goal 2 (Week 1): Pt will consistently demonstrate orientation x4 with mod A multimodal cues SLP Short Term Goal 3 (Week 1): Pt will sustain attention to functional task for 5 minutes with modA multimodal cues SLP Short Term Goal 4 (Week 1): Pt will demonstrate functional recall of daily events with modA multimodal cues SLP Short Term Goal 5 (Week 1): Pt will complete simple mental math problems with modA multimodal cues  Skilled Therapeutic Interventions: Skilled treatment session focused on dysphagia and cognitive goals. SLP facilitated session by providing skilled observation with breakfast meal of Dys. 2 textures with thin liquids. Patient demonstrated efficient mastication with complete oral clearance with overt cough X 1, suspect due to talking with a full oral cavity. Recommend patient continue current diet of Dys. 2 textures with trials of upgraded textures with SLP. Patient demonstrated selective attention to self-feeding for ~30 minutes with overall Mod I and was oriented to place, situation and month/year with Mod I. SLP provided a larger, more simple daily schedule for patient to utilize to maximize recall of upcoming appointments in which she was able to utilize with supervision verbal cues and extra time. Patient left upright in bed with alarm on and son present. Continue with current plan of care.      Pain Pain Assessment Pain Scale: 0-10 Pain Score: 0-No pain  Therapy/Group: Individual Therapy  Buzz Axel 07/05/2019, 12:25 PM

## 2019-07-05 NOTE — Plan of Care (Signed)
LTGs upgraded to [place] for transfers & gait with LRAD 2/2 progress.  Problem: RH Balance Goal: LTG Patient will maintain dynamic standing balance (PT) Description: LTG:  Patient will maintain dynamic standing balance with assistance during mobility activities (PT) Flowsheets (Taken 07/05/2019 1235) LTG: Pt will maintain dynamic standing balance during mobility activities with:: (upgrade 2/2 progress) Contact Guard/Touching assist   Problem: Sit to Stand Goal: LTG:  Patient will perform sit to stand with assistance level (PT) Description: LTG:  Patient will perform sit to stand with assistance level (PT) Flowsheets (Taken 07/05/2019 1235) LTG: PT will perform sit to stand in preparation for functional mobility with assistance level: (upgrade 2/2 progress) Contact Guard/Touching assist   Problem: RH Bed to Chair Transfers Goal: LTG Patient will perform bed/chair transfers w/assist (PT) Description: LTG: Patient will perform bed to chair transfers with assistance (PT). Flowsheets (Taken 07/05/2019 1235) LTG: Pt will perform Bed to Chair Transfers with assistance level: (upgrade 2/2 progress) Contact Guard/Touching assist Note: upgrade 2/2 progress   Problem: RH Car Transfers Goal: LTG Patient will perform car transfers with assist (PT) Description: LTG: Patient will perform car transfers with assistance (PT). Flowsheets (Taken 07/05/2019 1235) LTG: Pt will perform car transfers with assist:: (upgrade 2/2 progress) Contact Guard/Touching assist Note: upgrade 2/2 progress   Problem: RH Furniture Transfers Goal: LTG Patient will perform furniture transfers w/assist (OT/PT) Description: LTG: Patient will perform furniture transfers  with assistance (OT/PT). Flowsheets (Taken 07/05/2019 1235) LTG: Pt will perform furniture transfers with assist:: (upgrade 2/2 progress) Contact Guard/Touching assist Note: upgrade 2/2 progress   Problem: RH Ambulation Goal: LTG Patient will ambulate in controlled  environment (PT) Description: LTG: Patient will ambulate in a controlled environment, # of feet with assistance (PT). Flowsheets (Taken 07/05/2019 1235) LTG: Pt will ambulate in controlled environ  assist needed:: (upgrade 2/2 progress) Contact Guard/Touching assist LTG: Ambulation distance in controlled environment: 75 ft with LRAD Note: upgrade 2/2 progress Goal: LTG Patient will ambulate in home environment (PT) Description: LTG: Patient will ambulate in home environment, # of feet with assistance (PT). Flowsheets (Taken 07/05/2019 1235) LTG: Pt will ambulate in home environ  assist needed:: (upgrade 2/2 progress) Contact Guard/Touching assist Note: upgrade 2/2 progress   Problem: RH Stairs Goal: LTG Patient will ambulate up and down stairs w/assist (PT) Description: LTG: Patient will ambulate up and down # of stairs with assistance (PT) Flowsheets (Taken 07/05/2019 1235) LTG: Pt will  ambulate up and down number of stairs: 4 steps with B rails

## 2019-07-05 NOTE — Progress Notes (Signed)
Occupational Therapy Session Note  Patient Details  Name: [name] MRN: [id number] Date of Birth: 09-Nov-1928  Today's Date: 07/05/2019 OT Individual Time: 0945-1100 OT Individual Time Calculation (min): 75 min    Short Term Goals: Week 1:  OT Short Term Goal 1 (Week 1): Pt will transfer to [facility] with mod A OT Short Term Goal 2 (Week 1): Pt will don pants with mod A OT Short Term Goal 3 (Week 1): Pt will don shirt with min A OT Short Term Goal 4 (Week 1): Pt will require no more than mod cueing for focused attention to 1 grooming task  Skilled Therapeutic Interventions/Progress Updates:    Pt received supine, lethargic but arousable to cueing. Pt required mod A to transition to sitting EOb. Once EOB, pt perked up a bit and requried less cueing for arousal/attention throughout session. Pt completed sit > stand with mod steadying assist. Pt completed ambulatory transfer into bathroom with min A, requiring mod-max cueing for RW management, with frequent bumping into obstacles on L side. Pt also required mod cueing for LLE stride length and swing through. Pt completed toileting task (+urine) with min A for balance support. Pt transferred onto shower chair in walk in shower with min A. Pt able to wash UB seated with (S). Min A for LB standing balance support. Cueing for figure 4 position to wash feet. Pt required cueing for safety, to dry feet completely before walking as well as RW management. Pt sat EOB to dress. Pt required mod A to don depends, min A to don pants. Mod A to don shirt. Pt had good attention and initiation of tasks during bathing/dressing. Pt required cueing for problem solving the L attention at times. During transfers pt frequently abandons the RW and reaches for the sink, wall, etc, requiring mod cueing. Pt sat at sink and completed oral care with set up assist to open toothpaste, but pt able to accurately apply to toothbrush. Pt transferred to w/c and was taken to therapy gym for  time management. Pt sat at table and completed [facility] task with a focus on L NMR. Pt with very poor awareness of whether she was holding anything in her L hand at all- sensation vs. Attention. Pt required mod cueing throughout and often HOH to maintain pincer grasp on 1/2 in beads. Pt returned to room and was left sitting up in the recliner with son [name] present, all needs met.   Therapy Documentation Precautions:  Precautions Precautions: None Restrictions Weight Bearing Restrictions: No   Therapy/Group: Individual Therapy  Curtis Sites 07/05/2019, 7:18 AM

## 2019-07-06 ENCOUNTER — Inpatient Hospital Stay (HOSPITAL_COMMUNITY): Payer: Medicare Other | Admitting: Speech Pathology

## 2019-07-06 ENCOUNTER — Inpatient Hospital Stay (HOSPITAL_COMMUNITY): Payer: Medicare Other | Admitting: Physical Therapy

## 2019-07-06 ENCOUNTER — Inpatient Hospital Stay (HOSPITAL_COMMUNITY): Payer: Medicare Other

## 2019-07-06 LAB — GLUCOSE, CAPILLARY
Glucose-Capillary: 171 mg/dL — ABNORMAL HIGH (ref 70–99)
Glucose-Capillary: 170 mg/dL — ABNORMAL HIGH (ref 70–99)
Glucose-Capillary: 207 mg/dL — ABNORMAL HIGH (ref 70–99)
Glucose-Capillary: 138 mg/dL — ABNORMAL HIGH (ref 70–99)

## 2019-07-06 NOTE — Progress Notes (Signed)
Physical Therapy Session Note  Patient Details  Name: [name] MRN: [id number] Date of Birth: 02-07-1928  Today's Date: 07/06/2019 PT Individual Time: 0805-0900 PT Individual Time Calculation (min): 55 min   Short Term Goals: Week 2:  PT Short Term Goal 1 (Week 2): STG = LTG due to ELOS.  Skilled Therapeutic Interventions/Progress Updates:  Pt received in bed with son present for session, he reports car is 27.5 inches tall. Pt agreeable to tx. Pt transferred supine>sitting EOB with bed flat, no rails with supervision, tactile & verbal cuing for technique and extra time to upright trunk. Pt requests to eat breakfast & tolerates sitting EOB ~15 minutes without BLE support with pt beginning to demonstrate posterior/L lean as she fatigues towards end of task. While sitting EOB pt consumed meal with LUE with max assist to place spoon in hand 2/2 difficulty with fine motor control/coordination. Pt is able to self feed with LUE with extra time & only experiences 1 coughing episode during meal. Pt transfers sit<>stand with CGA & cuing & good return demo for improved eccentric lowering with stand>sit. Pt ambulates in room/bathroom with RW & CGA, completes toilet transfer from elevated seat with CGA and cuing to turn to seat. Pt with continent void & BM on toilet, attempting to perform posterior peri hygiene but requires assistance from therapist to ensure cleanliness. Pt stands at sink with CGA to perform hand hygiene.  Pt ambulates 60 ft + 15 ft + 76 ft with RW & CGA with excessive forward lean & thoracic kyphosis but this is pt's preexisting posture/gait. Pt does require cuing to not push RW too far out in front and pt is briefly able to return demo but then continues to push it in front of her. Pt requires seated rest break 2/2 back pain during task & therapist applied k-pad to back at end of session. Pt left in recliner with call bell in lap & son present to supervise.    Therapy  Documentation Precautions:  Precautions Precautions: None Restrictions Weight Bearing Restrictions: No    Therapy/Group: Individual Therapy  [name] 07/06/2019, 9:02 AM

## 2019-07-06 NOTE — Progress Notes (Signed)
Speech Language Pathology Daily Session Note  Patient Details  Name: [name] MRN: [id number] Date of Birth: 1928-07-25  Today's Date: 07/06/2019 SLP Individual Time: 1100-1155 SLP Individual Time Calculation (min): 55 min  Short Term Goals: Week 1: SLP Short Term Goal 1 (Week 1): Pt will tolerate current (dys2/thin) diet without overt s/s aspiration or decline in respiratory status SLP Short Term Goal 2 (Week 1): Pt will consistently demonstrate orientation x4 with mod A multimodal cues SLP Short Term Goal 3 (Week 1): Pt will sustain attention to functional task for 5 minutes with modA multimodal cues SLP Short Term Goal 4 (Week 1): Pt will demonstrate functional recall of daily events with modA multimodal cues SLP Short Term Goal 5 (Week 1): Pt will complete simple mental math problems with modA multimodal cues  Skilled Therapeutic Interventions: Skilled treatment session focused on dysphagia and cognitive goals. SLP facilitated session by providing skilled observation with trials of Dys. 3 textures. Patient demonstrated efficient but mildly prolonged mastication with complete oral clearance of solid textures with overt cough X 1, suspect due to talking with a full oral cavity. Recommend trial tray prior to upgrade. SLP also facilitated session by providing Min A verbal cues for recall of tasks to previous therapy sessions and Mod-Max A verbal cues for recall of current medications and their functions. Patient required more than a reasonable amount of time and Max A multimodal cues for problem solving with a basic pill organization task. Patient demonstrated appropriate awareness in regards to difficulty of task and verbalized need for assistance at home. Patient left upright in recliner with son present and all needs within reach. Continue with current plan of care.      Pain No/Denies Pain   Therapy/Group: Individual Therapy  [name], [place] 07/06/2019, 12:22 PM

## 2019-07-06 NOTE — Patient Care Conference (Signed)
Inpatient RehabilitationTeam Conference and Plan of Care Update Date: 07/06/2019   Time: 10:00 AM    Patient Name: [name]      Medical Record Number: [id number]  Date of Birth: 1928-04-26 Sex: Female         Room/Bed: 4W03C/4W03C-01 Payor Info: Payor: MEDICARE / Plan: MEDICARE PART A AND B / Product Type: *No Product type* /    Admitting Diagnosis: 1. TBI Team RT. CVA; 18-20days  Admit Date/Time:  06/28/2019  2:36 PM Admission Comments: No comment available   Primary Diagnosis:  Right temporal lobe infarction [facility]) Principal Problem: Right temporal lobe infarction [facility])  Patient Active Problem List   Diagnosis Date Noted  . Seizure ([place])   . Leukocytosis   . Controlled type 2 diabetes mellitus with hyperglycemia, without long-term current use of insulin ([place])   . Labile blood glucose   . PAF (paroxysmal atrial fibrillation) ([place])   . 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[place]) 06/28/2019  . CVA (cerebral vascular accident) ([place]) 06/27/2019  . TIA (transient ischemic attack) 06/26/2019  . Hyperlipidemia   . Type 2 diabetes mellitus ([place])   . Degeneration of lumbar intervertebral disc 12/11/2017  . Cough   . Hematoma   . Groin swelling 04/12/2017  . Hematoma of groin 04/12/2017  . Critical lower limb ischemia 04/08/2017  . Long term (current) use of anticoagulants [Z79.01] 12/13/2015  . Closed dislocation of tarsometatarsal joint 10/05/2015  . ULCER OF OTHER PART OF FOOT 06/06/2010  . Essential hypertension 03/26/2010  . ATRIAL FIBRILLATION 03/26/2010  . CUTANEOUS ERUPTIONS, DRUG-INDUCED 03/26/2010  . DIARRHEA 03/26/2010  . GERD 03/20/2010  . UNSPEC LOCAL INFECTION SKIN&SUBCUTANEOUS TISSUE 03/20/2010  . OSTEOARTHRITIS 03/20/2010  . ARTHRITIS, SEPTIC 03/14/2010  . OTH COMPLICATIONS DUE INTERNAL JOINT PROSTHESIS 02/22/2010    Expected Discharge Date: Expected Discharge Date: 07/13/19  Team Members Present: Physician leading conference: Dr. Alger Simons Social Worker  Present: Alfonse Alpers, LCSW Nurse Present: Other (comment)(Daniel Greber, RN) PT Present: Lavone Nian, PT OT Present: Laverle Hobby, OT SLP Present: Weston Anna, SLP PPS Coordinator present : Gunnar Fusi, SLP     Current Status/Progress Goal Weekly Team Focus  Medical   right temporal infarct, PCA. sz disorder. a fib. htn  improve arousal and cognition  sz prevention, nutrition. bp control, see above   Bowel/Bladder   Pt is continent of B/B. LBM 07/05/2019.  Maintain regular bowel pattern.  Assist with toileting needs PRN.   Swallow/Nutrition/ Hydration   Dys. 2 textures with thin liquids, superivision  Mod A  Trials of upgraded textures   ADL's   Mod A LB dressing, mod A UB dressing, difficulty sequencing and processing dressing tasks with cueing required, min A transfers  Min A ADLs  Initiation, arousal, ADL retraining, ADL transfers, family edu, LUE NMR   Mobility   min assist overall, gait up to 75 ft with RW & min assist, L hemi & decreased attention, impaired cognition/awareness/memory  gait goals upgraded to CGA, bed mobility supervision, transfers CGA, stairs with min assist  transfers, bed mobility, gait, stairs, endurance, NMR, pt/family education, awarenes, memory/recall   Communication             Safety/Cognition/ Behavioral Observations  Mod-Max A  Mod A  attention, recall with use of strategies  and awareness   Pain   No complaints of pain  Remain pain free  Assess pain Q shift and PRN   Skin   No skin issues  Maintain skin integrity and prevnt skin  breakdown  Assess skin Q shift and PRN    Rehab Goals Patient on target to meet rehab goals: Yes Rehab Goals Revised: some goals have been upgraded to [place] and progress notes for long and short-term goals.     Barriers to Discharge  Current Status/Progress Possible Resolutions Date Resolved   Physician    Medical stability        see medical progress notes      Nursing                   PT  Medical stability                 OT Medical stability                SLP                SW                Discharge Planning/Teaching Needs:  Pt's husband and adult children plan to care for pt at home.  Family will be offered education prior to pt returning home.   Team Discussion:  Pt with right temporal infarct and some unresponsive episodes/seizures, being managed by [place].  This affects pt cognitively and Dr. Naaman Plummer may need to go down on Keppra a bit.  She's more alert today, but has confusion at night.  Pt is eating well and reports no pain  She is mod A with upper and lowe body dressing - needs help with sequencing.  Pt is min A for txs and has min A ADL goals.  Pt is CGA/Min A for household distances with rolling walker and goals are bing upgraded to [place].  Pt is doing well with D2 diet with D3 trials today.  SLP working on cognition and short term memory strategies.  Revisions to Treatment Plan:  none    Continued Need for Acute Rehabilitation Level of Care: The patient requires daily medical management by a physician with specialized training in physical medicine and rehabilitation for the following conditions: Daily direction of a multidisciplinary physical rehabilitation program to ensure safe treatment while eliciting the highest outcome that is of practical value to the patient.: Yes Daily medical management of patient stability for increased activity during participation in an intensive rehabilitation regime.: Yes Daily analysis of laboratory values and/or radiology reports with any subsequent need for medication adjustment of medical intervention for : Neurological problems;Nutritional problems   I attest that I was present, lead the team conference, and concur with the assessment and plan of the team.Team conference was held via web/ teleconference due to [place] - 19.   Joycelyn Liska, Silvestre Mesi 07/06/2019, 11:57 PM

## 2019-07-06 NOTE — Progress Notes (Signed)
Occupational Therapy Session Note  Patient Details  Name: [name] MRN: [id number] Date of Birth: May 29, 1928  Today's Date: 07/06/2019 OT Individual Time: 1400-1515 OT Individual Time Calculation (min): 75 min    Short Term Goals: Week 1:  OT Short Term Goal 1 (Week 1): Pt will transfer to [facility] with mod A OT Short Term Goal 2 (Week 1): Pt will don pants with mod A OT Short Term Goal 3 (Week 1): Pt will don shirt with min A OT Short Term Goal 4 (Week 1): Pt will require no more than mod cueing for focused attention to 1 grooming task  Skilled Therapeutic Interventions/Progress Updates:    Pt received sitting up in recliner with son [name] present, pt alert and ready for therapy. Pt c/o pain in her neck, unrated, describing it as chronic pain that bothers her often, intervention included increased mobility and environment change. Pt completed sit > stand from recliner with min steadying assist. Pt stood at sink and completed oral care with min HOH to apply toothpaste to toothbrush. Pt was then transported to the therapy gym via w/c for time management. Pt completed 10 ft of functional mobility with CGA. Pt completed simulated walk in shower transfer with 4 in threshold, using backing-in method. Pt required CGA and min cueing. Pt then sat EOM and completed bimanual [place] task of threading beads and building pipe tree. Mod cueing required and min HOH to thread 1 in beads. Pt required mod-max cueing for pipe tree, including deficits in figure-ground discrimination. Pt returned to w/c and was transported to [facility] room. Pt had significant difficulty with accuracy with reaching, L vs. R 17.9 vs 10.01 seconds , and then 2nd trial 12 vs 7 seconds. Pt completed 1 trial seated and 1 standing. Also trialed having lights on vs off, with no big difference observed in accuracy. Pt returned to room and completed toileting tasks with min A. Pt required mod A to change brief. Pt was left supine with son present  and all needs met.   Therapy Documentation Precautions:  Precautions Precautions: None Restrictions Weight Bearing Restrictions: No   Therapy/Group: Individual Therapy  Curtis Sites 07/06/2019, 7:20 AM

## 2019-07-07 ENCOUNTER — Inpatient Hospital Stay (HOSPITAL_COMMUNITY): Payer: Medicare Other

## 2019-07-07 ENCOUNTER — Encounter (HOSPITAL_COMMUNITY): Payer: Medicare Other | Admitting: Psychology

## 2019-07-07 ENCOUNTER — Inpatient Hospital Stay (HOSPITAL_COMMUNITY): Payer: Medicare Other | Admitting: Physical Therapy

## 2019-07-07 ENCOUNTER — Ambulatory Visit (HOSPITAL_COMMUNITY): Payer: Medicare Other | Admitting: Speech Pathology

## 2019-07-07 DIAGNOSIS — I63431 Cerebral infarction due to embolism of right posterior cerebral artery: Secondary | ICD-10-CM

## 2019-07-07 DIAGNOSIS — R413 Other amnesia: Secondary | ICD-10-CM

## 2019-07-07 LAB — GLUCOSE, CAPILLARY
Glucose-Capillary: 168 mg/dL — ABNORMAL HIGH (ref 70–99)
Glucose-Capillary: 221 mg/dL — ABNORMAL HIGH (ref 70–99)
Glucose-Capillary: 144 mg/dL — ABNORMAL HIGH (ref 70–99)
Glucose-Capillary: 189 mg/dL — ABNORMAL HIGH (ref 70–99)

## 2019-07-07 MED ORDER — METFORMIN HCL 500 MG PO TABS
250.0000 mg | ORAL_TABLET | Freq: Two times a day (BID) | ORAL | Status: DC
  Administered 2019-07-07 – 2019-07-09 (×5): 250 mg via ORAL
  Filled 2019-07-07 (×5): qty 1

## 2019-07-07 NOTE — Progress Notes (Signed)
Occupational Therapy Weekly Progress Note  Patient Details  Name: [name] MRN: [id number] Date of Birth: [birth year]  Beginning of progress report period: June 30, 2019 End of progress report period: July 07, 2019  Today's Date: 07/07/2019 OT Individual Time: 3612-2449 OT Individual Time Calculation (min): 70 min    Patient has met 4 of 4 short term goals.  Pt has made excellent progress in her OT POC this reporting period. Pt has improved alertness and orientation, as well as improved ability to complete ADLs and transfers with decreased level of assist. Pt is on track to meet her previously established OT goals. Family edu has been occurring throughout all sessions with son [name] present almost every day.   Patient continues to demonstrate the following deficits: muscle weakness, decreased coordination and decreased motor planning, decreased attention to left, decreased initiation, decreased attention, decreased awareness, decreased problem solving, decreased safety awareness, decreased memory and delayed processing and decreased sitting balance, decreased standing balance, decreased postural control, hemiplegia and decreased balance strategies and therefore will continue to benefit from skilled OT intervention to enhance overall performance with BADL.  Patient progressing toward long term goals.  Continue plan of care.  OT Short Term Goals Week 1:  OT Short Term Goal 1 (Week 1): Pt will transfer to [facility] with mod A OT Short Term Goal 1 - Progress (Week 1): Met OT Short Term Goal 2 (Week 1): Pt will don pants with mod A OT Short Term Goal 2 - Progress (Week 1): Met OT Short Term Goal 3 (Week 1): Pt will don shirt with min A OT Short Term Goal 3 - Progress (Week 1): Met OT Short Term Goal 4 (Week 1): Pt will require no more than mod cueing for focused attention to 1 grooming task OT Short Term Goal 4 - Progress (Week 1): Met Week 2:  OT Short Term Goal 1 (Week 2): STG = LTG d/t  ELOS  Skilled Therapeutic Interventions/Progress Updates:    Pt received supine with c/o soreness in neck and back, 4/10, intervention includes shower and ambulation. Pt completed bed mobility with min A to power up. CGA to power up from EOB using RW. Pt completed ambulatory transfer into bathroom with CGA with RW, requiring cueing for LT attention. Pt transferred onto toilet, requiring min A overall for clothing management. Pt transferred into shower with CGA. Pt washed UB with improved LUE spontaneous use, no dropping of washcloth. Pt completed LB bathing, with cueing required for washing feet seated. Pt completed hair care seated with close (S). Pt transferred out of shower with CGA and to EOB. Pt donned bra with min A, shirt with min A. Pt donned pants with min A to thread over RLE and min balance support when standing. Pt donned socks with increased time for problem solving and CGA for distal reaching. Pt was set up for breakfast and given full (S), intermittent cueing for slow rate. Pt left sitting up with son present, all needs met.  Therapy Documentation Precautions:  Precautions Precautions: None Restrictions Weight Bearing Restrictions: No   Therapy/Group: Individual Therapy  Curtis Sites 07/07/2019, 7:10 AM

## 2019-07-07 NOTE — Progress Notes (Signed)
Physical Therapy Session Note  Patient Details  Name: CAMBRIA OSTEN MRN: [id number] Date of Birth: 1928-03-13  Today's Date: 07/07/2019 PT Individual Time: 3435-6861 PT Individual Time Calculation (min): 30 min   Short Term Goals: Week 2:  PT Short Term Goal 1 (Week 2): STG = LTG due to ELOS.  Skilled Therapeutic Interventions/Progress Updates: Pt presented in w/c with son present agreeable to therapy. Pt denies pain throughout session. Pt transported to day room and performed ambulatory transfer to mat. Participated in cornhole game in both sitting and standing for reaching/dyanmic balance. Pt ambulated to NuStep CGA with mod verbal cues for direction due to pt not being able to see NuStep seat. Pt required cues for pushing vs lifting RW and x 1 bout of L foot catching on ground with PTA providing assistance for stability.  Performed NuStep L1 x 4 min for general conditioning. Pt returned to room at end of session and stated wanted to remain in w/c. Pt remained in chair with son present and current needs met.      Therapy Documentation Precautions:  Precautions Precautions: None Restrictions Weight Bearing Restrictions: No General:   Vital Signs: Therapy Vitals Temp: (!) 96.9 F (36.1 C) Pulse Rate: 64 Resp: 18 BP: 113/72 Patient Position (if appropriate): Sitting Oxygen Therapy SpO2: 96 % O2 Device: Room Air   Therapy/Group: Individual Therapy  Evalina Tabak  Jhostin Epps, PTA  07/07/2019, 3:52 PM

## 2019-07-07 NOTE — Progress Notes (Signed)
Physical Therapy Session Note  Patient Details  Name: [name] MRN: [id number] Date of Birth: [birth year]  Today's Date: 07/07/2019 PT Individual Time: 1035-1130 PT Individual Time Calculation (min): 55 min   Short Term Goals: Week 2:  PT Short Term Goal 1 (Week 2): STG = LTG due to ELOS.  Skilled Therapeutic Interventions/Progress Updates:   Pt received sitting in WC and agreeable to PT. Pt transported to rehab gym in [facility]. 5xSTS with BUE support x 69 sec and CGA-min assist from PT to push into standing. Gait training with RW 2 x 50f +1575fwith CGA progressing to supervision assist. Car transfer training x 2 with CGA assist-min assist overall from PT; performed to 25" and progressed to 27.5". cues for technique and safety throughout transfer as well as attention to the LLE. Dynamic gait training up/down 12 ft ramp with CGA and moderate cues for AD management and gait pattern to remain in RW. [facility] and all needs met.         Therapy Documentation Precautions:  Precautions Precautions: None Restrictions Weight Bearing Restrictions: No Pain: 2/10 low back. Chronic, increased ambulation.    Therapy/Group: Individual Therapy  AuLorie Phenix/19/2020, 11:01 AM

## 2019-07-07 NOTE — Consult Note (Signed)
Neuropsychological Consultation   Patient:   [name]   DOB:   [birth year]  MR Number:  [id number]  Location:  [place] [facility] [place] [id number] [place] [address] Dept: [place]: [phone]           Date of Service:   07/07/2019  Start Time:   2 PM End Time:   3 PM  Provider/Observer:  Ilean Skill, Psy.D.       Clinical Neuropsychologist       Billing Code/Service: [id number]  Chief Complaint:    [name] is an 85 year old female with history of PAF, PVD, T2DM, hypertension, hyperlipidemia.  Presented on 06/26/2019 with left-sided weakness and slurred speech that has been transient over 2 days.  MRI showed small foci of acute early subacute ischemia within the posterior right temporal lobe within the PCA territory.  Patient was admitted for CIR program.    Reason for Service:  Patient was referred for neuropsychological consultation due to coping and adjustment following stroke and seizure.  Below is the HPI for the current admission.  [name] is an 85 year old right-handed female with history of PAF maintained on Coumadin, peripheral vascular disease status post left SFA angioplasty and stenting, type 2 diabetes mellitus, hypertension, hyperlipidemia. Per chart review patient lives with spouse. 1 level home with ramped entrance. Ambulates with a rolling walker. She was performing her own ADLs modified independent. Presented 06/26/2019 with left-sided weakness and slurred speech that has been transient over 2 days. No reports of chest pain or shortness of breath. Blood pressure in the ED 143/95-213/112. INR on admission of 2.00, COVID negative. Cranial CT scan negative for acute changes. Patient did not receive TPA. MRI of the brain showed small foci of acute early subacute ischemia within the posterior right temporal lobe within the PCA territory. MR angiogram  showed short segment occlusion of both posterior cerebral arteries, the P2-3 junction on the right and proximal P2 segment on the left. Short segment occlusion of the right MCA M1 segment just proximal to the bifurcation with normal M2 branches. Carotid Dopplers with no significant ICA stenosis. Echocardiogram with ejection fraction of 01% normal systolic function. Neurology consulted Coumadin transition to Eliquis as well as remaining on aspirin therapy. Tolerating a regular consistency diet. Appetite has been limited started on gentle IV fluids. Therapy evaluations completed and patient was admitted for a comprehensive rehab program  Current Status:  Patient was clearly worn out from days therapies.  Speech was very slow with reduced fluency.  Patient did appear confused to some degree and complained of backache.  Son was present and reports that she was much better this AM but it was normal for her to wear out in afternoon.  Patient's mental status was poor and ability to express self was poor.  Behavioral Observation: [name]  presents as a 85 y.o.-year-old Right Caucasian Female who appeared her stated age. her dress was Appropriate and she was Well Groomed and her manners were Appropriate to the situation.  her participation was indicative of Appropriate, Drowsy and Inattentive behaviors.  There were any physical disabilities noted.  she displayed an appropriate level of cooperation and motivation.     Interactions:    Minimal Appropriate, Drowsy and Inattentive  Attention:   abnormal and attention span appeared shorter than expected for age  Memory:   abnormal; global memory impairment noted  Visuo-spatial:  not examined  Speech (Volume):  low  Speech:   slurred; slowed response and expression  Thought Process:  Coherent and Circumstantial  Though Content:  WNL; not suicidal and not homicidal  Orientation:   person and  place  Judgment:   Poor  Planning:   Poor  Affect:    Flat and Lethargic  Mood:    Dysphoric  Insight:   Shallow  Intelligence:   normal  Medical History:   Past Medical History:  Diagnosis Date  . Acute osteomyelitis ([place]) 08/25/2018   Right toe  . Anemia   . Atrial fibrillation ([place])   . Carpal tunnel syndrome, bilateral   . Cellulitis 08/25/2018   Right foot  . Cervical lymphadenopathy   . Compression fracture of fifth lumbar vertebra ([facility])    to T 11  . DDD (degenerative disc disease), lumbar   . DJD (degenerative joint disease)   . DOE (dyspnea on exertion)   . Dysphagia   . Dyspnea    with exertion  . Dysrhythmia    hx brief AF post op hip surg  . Esophageal dysmotility 01/31/2014   noted barium  . GERD (gastroesophageal reflux disease)   . Hematoma    right side after stent placement  . History of hiatal hernia 01/31/2014   Small, noted on Barium  . Hyperlipidemia   . Hypertension   . Jaundice   . Low back pain   . MRSA (methicillin resistant Staphylococcus aureus)   . Neuropathy   . Nocturia   . OA (osteoarthritis)   . Osteoporosis   . Ovarian cyst   . PAD (peripheral artery disease) ([place])   . Peripheral vascular disease ([place])   . Pleural effusion, left 04/17/2017   Small  . RA (rheumatoid arthritis) ([place])   . Spinal stenosis   . Toe ulcer, right, with necrosis of bone ([place]) 08/25/2018  . Type 2 diabetes mellitus ([place])   . Unsteady gait   . Urinary incontinence   . Uterine polyp   . Varicose vein of leg    Bilateral   Psychiatric History:  No prior psychiatric history  Family Med/Psych History:  Family History  Problem Relation Age of Onset  . Cancer Mother     Risk of Suicide/Violence: virtually non-existent   Impression/DX:  [name] is an 85 year old female with history of PAF, PVD, T2DM, hypertension, hyperlipidemia.  Presented on 06/26/2019 with left-sided weakness and slurred speech that has been transient over 2 days.   MRI showed small foci of acute early subacute ischemia within the posterior right temporal lobe within the PCA territory.  Patient was admitted for CIR program.    Patient was clearly worn out from days therapies.  Speech was very slow with reduced fluency.  Patient did appear confused to some degree and complained of backache.  Son was present and reports that she was much better this AM but it was normal for her to wear out in afternoon.  Patient's mental status was poor and ability to express self was poor.   Diagnosis:    Right temporal lobe infarction [facility]) - Plan: CANCELED: Ambulatory referral to Neurology  Seizure [facility]) - Plan: Ambulatory referral to Neurology  Cerebrovascular accident (CVA) due to embolism of right posterior cerebral artery [facility]) - Plan: Ambulatory referral to Neurology         Electronically Signed   _______________________ Ilean Skill, Psy.D.

## 2019-07-07 NOTE — Progress Notes (Addendum)
[name] PHYSICAL MEDICINE & REHABILITATION PROGRESS NOTE   Subjective/Complaints: Up in shower with OT. Says her night was a little better.   ROS: Limited somewhat due to cognitive/behavioral   Objective:   No results found. No results for input(s): WBC, HGB, HCT, PLT in the last 72 hours. No results for input(s): NA, K, CL, CO2, GLUCOSE, BUN, CREATININE, CALCIUM in the last 72 hours.  Intake/Output Summary (Last 24 hours) at 07/07/2019 0815 Last data filed at 07/06/2019 1900 Gross per 24 hour  Intake 360 ml  Output -  Net 360 ml     Physical Exam: Vital Signs Blood pressure (!) 124/54, pulse (!) 58, temperature 98 F (36.7 C), resp. rate 18, height 5\' 3"  (1.6 m), weight 52 kg, SpO2 97 %. Constitutional: No distress . Vital signs reviewed. HEENT: EOMI, oral membranes moist Neck: supple Cardiovascular: RRR without murmur. No JVD    Respiratory: CTA Bilaterally without wheezes or rales. Normal effort    GI: BS +, non-tender, non-distended  Skin: Warm and dry.  Intact. Psych: Normal mood.  Normal behavior. Musc: No edema.  No tenderness. Neurological: oriented to person, hospital Alert, follows basic commands. Oriented to person, hospital, reason she'shere Left eye ptosis ongoing.  Motor:  Limited due to participation RUE/RLE: Grossly 4/5 LUE/LE: 4/5 Mild left neglect   Skin: Missing 3rd and 5th toes right foot and 3rd toe left foot.  No changes Psychiatric: pleasant and cooperative  Assessment/Plan: 1. Functional deficits secondary to right temporal lobe/PCA distribution infarct which require 3+ hours per day of interdisciplinary therapy in a comprehensive inpatient rehab setting.  Physiatrist is providing close team supervision and 24 hour management of active medical problems listed below.  Physiatrist and rehab team continue to assess barriers to discharge/monitor patient progress toward functional and medical goals  Care Tool:  Bathing    Body parts  bathed by patient: Right arm, Left arm, Chest, Abdomen, Right upper leg, Left upper leg, Face, Front perineal area, Buttocks, Right lower leg, Left lower leg   Body parts bathed by helper: Left lower leg, Right lower leg     Bathing assist Assist Level: Minimal Assistance - Patient > 75%     Upper Body Dressing/Undressing Upper body dressing   What is the patient wearing?: Pull over shirt    Upper body assist Assist Level: Supervision/Verbal cueing    Lower Body Dressing/Undressing Lower body dressing      What is the patient wearing?: Pants, Incontinence brief     Lower body assist Assist for lower body dressing: Moderate Assistance - Patient 50 - 74%     Toileting Toileting Toileting Activity did not occur Landscape architect and hygiene only): Refused  Toileting assist Assist for toileting: Moderate Assistance - Patient 50 - 74%     Transfers Chair/bed transfer  Transfers assist  Chair/bed transfer activity did not occur: Safety/medical concerns  Chair/bed transfer assist level: Minimal Assistance - Patient > 75%     Locomotion Ambulation   Ambulation assist   Ambulation activity did not occur: Safety/medical concerns  Assist level: Contact Guard/Touching assist Assistive device: Walker-rolling Max distance: 60 ft   Walk 10 feet activity   Assist  Walk 10 feet activity did not occur: Safety/medical concerns  Assist level: Contact Guard/Touching assist Assistive device: Walker-rolling   Walk 50 feet activity   Assist Walk 50 feet with 2 turns activity did not occur: Safety/medical concerns  Assist level: Contact Guard/Touching assist Assistive device: Walker-rolling    Walk 150  feet activity   Assist Walk 150 feet activity did not occur: Safety/medical concerns  Assist level: Contact Guard/Touching assist Assistive device: Walker-rolling    Walk 10 feet on uneven surface  activity   Assist Walk 10 feet on uneven surfaces activity  did not occur: Safety/medical concerns         Wheelchair     Assist Will patient use wheelchair at discharge?: No   Wheelchair activity did not occur: N/A         Wheelchair 50 feet with 2 turns activity    Assist    Wheelchair 50 feet with 2 turns activity did not occur: N/A       Wheelchair 150 feet activity     Assist Wheelchair 150 feet activity did not occur: N/A        Medical Problem List and Plan: 1.Left-sided weakness with slurred speechsecondary to acute early subacute ischemia within the posterior right temporal lobe within the PCA territory. 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 Appreciate assistance  EEG negative for seizures  -seems to be slowly improving cognitively after reduction in keppra dose   2. Antithrombotics: -DVT/anticoagulation:Eliquis -antiplatelet therapy: Aspirin 325 mg daily 3. Pain Management:Tylenol as needed 4. Mood:Provide emotional support -antipsychotic agents: N/A 5. Neuropsych: This patientisnot capable of making decisions on herown behalf. 6. Skin/Wound Care:Routine skin checks 7. Fluids/Electrolytes/Nutrition:encourage PO     8. PAF. Continue Eliquis    9. Hypertension.   Increased Toprol to 25mg  bid 8/12 Vitals:   07/06/19 1952 07/07/19 0548  BP: (!) 151/97 (!) 124/54  Pulse: 89 (!) 58  Resp: 16 18  Temp: 98.6 F (37 C) 98 F (36.7 C)  SpO2: 93% 81%  Systolic HTN increased amlodipine to 10 mg withsome improvement  -continue to follow 10. Diabetes mellitus with hyperglycemia. Hemoglobin A1c 7.4. SSI. Patient on Glucophage 500 mg twice daily prior to admission.   Borderline/improving    -begin metformin 250mg  bid (was never started before)  Cover with ssi as needed    11. Hyperlipidemia. Lipitor 12.Peripheral vascular disease with history of left SFA angioplasty and stenting in  the past. Continue low-dose aspirin 13.Decreased nutritional storage. Dietary follow-up.   -protein supp 14.  Leukocytosis  WBCs 10.8 on 8/11  Continue to monitor   LOS: 9 days A FACE TO [place] 07/07/2019, 8:15 AM

## 2019-07-07 NOTE — Progress Notes (Signed)
Social Work Patient ID: [name], female   DOB: February 05, 1928, 85 y.o.   MRN: [id number]   CSW met with pt and her son to update them on team conference discussion and to share with them targeted d/c date of 07-13-19.  Son is pleased with that date and with pt's progress.  Pt would like to go home sooner, but understands she needs more therapy first.  CSW answered son's questions about how DME will be ordered and therapies arranged.  CSW will assist with both closer to d/c.  CSW remains available as needed.

## 2019-07-07 NOTE — Progress Notes (Signed)
Speech Language Pathology Weekly Progress and Session Note  Patient Details  Name: [name] MRN: [id number] Date of Birth: [birth year]  Beginning of progress report period: June 30, 2019 End of progress report period: July 07, 2019  Today's Date: 07/07/2019 SLP Individual Time: 1130-1200 SLP Individual Time Calculation (min): 30 min  Short Term Goals: Week 1: SLP Short Term Goal 1 (Week 1): Pt will tolerate current (dys2/thin) diet without overt s/s aspiration or decline in respiratory status SLP Short Term Goal 1 - Progress (Week 1): Met SLP Short Term Goal 2 (Week 1): Pt will consistently demonstrate orientation x4 with mod A multimodal cues SLP Short Term Goal 2 - Progress (Week 1): Met SLP Short Term Goal 3 (Week 1): Pt will sustain attention to functional task for 5 minutes with modA multimodal cues SLP Short Term Goal 3 - Progress (Week 1): Met SLP Short Term Goal 4 (Week 1): Pt will demonstrate functional recall of daily events with modA multimodal cues SLP Short Term Goal 4 - Progress (Week 1): Met SLP Short Term Goal 5 (Week 1): Pt will complete simple mental math problems with modA multimodal cues SLP Short Term Goal 5 - Progress (Week 1): Met    New Short Term Goals: Week 2: SLP Short Term Goal 1 (Week 2): Patient will consume current diet with minimal overt s/s of aspiration with supervison level verbal cues for use of swallowing compensatory strategies. SLP Short Term Goal 2 (Week 2): Patient will sustain attention to functional tasks for 15 minutes with mod A multimodal cues SLP Short Term Goal 3 (Week 2): Patient will consistently demonstrate orientation x4 with min A multimodal cues SLP Short Term Goal 4 (Week 2): Patient will demonstrate functional recall of daily events with min A multimodal cues  Weekly Progress Updates: Patient has made slow but functional gains and has met 5 of 5 STGs this reporting period. Currently, patient is consuming Dys. 3 textures  with minimal overt s/s of aspiration and overall supervision level verbal cues for use of swallowing compensatory strategies. Patient also requires overall Mod A multimodal cues to complete basic and familiar tasks safely in regards to sustained attention, orientation and recall with use of strategies. Patient and family education ongoing. Patient would benefit from continued skilled SLP intervention to maximize her cognitive and swallowing function prior to discharge.      Intensity: Minumum of 1-2 x/day, 30 to 90 minutes Frequency: 3 to 5 out of 7 days Duration/Length of Stay: 07/13/19 Treatment/Interventions: Cognitive remediation/compensation;Internal/external aids;Environmental controls;Dysphagia/aspiration precaution training;Functional tasks;Patient/family education;Therapeutic Activities;Cueing hierarchy   Daily Session  Skilled Therapeutic Interventions: Skilled treatment session focused on cognitive and dysphagia goals. Upon arrival, patient was lethargic and reported she had back pain. RN arrived and administered medications. Patient agreeable to consuming trial tray of Dys. 3 textures with thin liquids. Patient demonstrated efficient masticaiton and complete oral clearance without overt s/s of aspiration but required encouragement for PO intake and overall Mod A verbal cues for sustained attention and initiation with task. Recommend patient upgrade to Dys. 3 textures with full supervision to maximize safety. Patient and son educated on recommendations and verbalized understanding. Patient left upright in bed with alarm on and all needs within reach. Continue with current plan of care.      Pain Back Pain Unable to rate RN notified and administered medications, patient repositioned   Therapy/Group: Individual Therapy  [name] 07/07/2019, 6:59 AM

## 2019-07-08 ENCOUNTER — Inpatient Hospital Stay (HOSPITAL_COMMUNITY): Payer: Medicare Other | Admitting: Rehabilitation

## 2019-07-08 ENCOUNTER — Inpatient Hospital Stay (HOSPITAL_COMMUNITY): Payer: Medicare Other | Admitting: Occupational Therapy

## 2019-07-08 ENCOUNTER — Inpatient Hospital Stay (HOSPITAL_COMMUNITY): Payer: Medicare Other

## 2019-07-08 ENCOUNTER — Inpatient Hospital Stay (HOSPITAL_COMMUNITY): Payer: Medicare Other | Admitting: Speech Pathology

## 2019-07-08 LAB — GLUCOSE, CAPILLARY
Glucose-Capillary: 158 mg/dL — ABNORMAL HIGH (ref 70–99)
Glucose-Capillary: 295 mg/dL — ABNORMAL HIGH (ref 70–99)
Glucose-Capillary: 157 mg/dL — ABNORMAL HIGH (ref 70–99)
Glucose-Capillary: 154 mg/dL — ABNORMAL HIGH (ref 70–99)

## 2019-07-08 LAB — BASIC METABOLIC PANEL
Anion gap: 13 (ref 5–15)
BUN: 26 mg/dL — ABNORMAL HIGH (ref 8–23)
CO2: 23 mmol/L (ref 22–32)
Calcium: 9.1 mg/dL (ref 8.9–10.3)
Chloride: 101 mmol/L (ref 98–111)
Creatinine, Ser: 0.84 mg/dL (ref 0.44–1.00)
GFR calc Af Amer: >60 mL/min (ref >60)
GFR calc non Af Amer: >60 mL/min (ref >60)
Glucose, Bld: 155 mg/dL — ABNORMAL HIGH (ref 70–99)
Potassium: 3.9 mmol/L (ref 3.5–5.1)
Sodium: 137 mmol/L (ref 135–145)

## 2019-07-08 LAB — CBC
HCT: 48.7 % — ABNORMAL HIGH (ref 36.0–46.0)
Hemoglobin: 16.4 g/dL — ABNORMAL HIGH (ref 12.0–15.0)
MCH: 32 pg (ref 26.0–34.0)
MCHC: 33.7 g/dL (ref 30.0–36.0)
MCV: 95.1 fL (ref 80.0–100.0)
Platelets: 271 10*3/uL (ref 150–400)
RBC: 5.12 MIL/uL — ABNORMAL HIGH (ref 3.87–5.11)
RDW: 12.1 % (ref 11.5–15.5)
WBC: 10.7 10*3/uL — ABNORMAL HIGH (ref 4.0–10.5)
nRBC: 0 % (ref 0.0–0.2)

## 2019-07-08 NOTE — Progress Notes (Signed)
Speech Language Pathology Daily Session Note  Patient Details  Name: GEISHA ABERNATHY MRN: [id number] Date of Birth: 02/24/1928  Today's Date: 07/08/2019 SLP Individual Time: 0900-0945 SLP Individual Time Calculation (min): 45 min  Short Term Goals: Week 2: SLP Short Term Goal 1 (Week 2): Patient will consume current diet with minimal overt s/s of aspiration with supervison level verbal cues for use of swallowing compensatory strategies. SLP Short Term Goal 2 (Week 2): Patient will sustain attention to functional tasks for 15 minutes with mod A multimodal cues SLP Short Term Goal 3 (Week 2): Patient will consistently demonstrate orientation x4 with min A multimodal cues SLP Short Term Goal 4 (Week 2): Patient will demonstrate functional recall of daily events with min A multimodal cues  Skilled Therapeutic Interventions: Skilled treatment session focused on cognitive goals. SLP facilitated session by initiating a memory notebook to maximize recall and carryover of daily, functional information. Patient utilized the notebook to recall daily information with Min A verbal cues and 90% accuracy. Patient's son educated about importance of utilizing the notebook to maximize recall, he verbalized understanding and reported he would reinforce use. Patient requested to use the bathroom and was able to successfully void on the commode. Patient handed off to PT. Continue with current plan of care.       Pain No/Denies Pain   Therapy/Group: Individual Therapy  Angelita Harnack 07/08/2019, 12:26 PM

## 2019-07-08 NOTE — Progress Notes (Signed)
Physical Therapy Session Note  Patient Details  Name: [name] MRN: [id number] Date of Birth: [birth year]  Today's Date: 07/08/2019 PT Individual Time: 6734-1937 PT Individual Time Calculation (min): 57 min   Short Term Goals: Week 1:  PT Short Term Goal 1 (Week 1): Pt will participate in 30 min of OOB activity w/o increase in fatigue PT Short Term Goal 1 - Progress (Week 1): Progressing toward goal PT Short Term Goal 2 (Week 1): Pt will transfer sit<>stand w/ mod assist consistently PT Short Term Goal 2 - Progress (Week 1): Met PT Short Term Goal 3 (Week 1): Pt will perform bed mobility w/ mod assist PT Short Term Goal 3 - Progress (Week 1): Met Week 2:  PT Short Term Goal 1 (Week 2): STG = LTG due to ELOS.  Skilled Therapeutic Interventions/Progress Updates:   Pt received sitting in w/c in room, SLP just finishing with pt.  Son in room with pt showing PT memory book so that she may write what we worked on during session.  Assisted pt to therapy gym for time management.  Once in therapy gym, performed single bout of gait with RW x 100' at min A level.  Continued cues for posture, improved stride length and keeping proper positioning of RW throughout.  Pt tends to push walker too far ahead, then steps into RW.  PT provided cues throughout, however did not note much carryover during session.  Also provided cues for continued pushing of walker as she tends to pick up at times causing mild unsteadiness.  Performed standing balance using BUEs on PT for support with pt in staggered stance (each direction) shifting forward/backward and maintaining stance.  Pt needs max facilitation for adequate forward weight shift onto LLE and note increased fear to step LLE when cued by PT.  Performed two more bouts of gait (to ADL apt) x 75' with cues and assist as above.  And then another 150' to ortho gym (car side) to practice car transfer.  Note that with increased fatigue, pt tends to push walker even further  away from her, needing min A to steady and PT assist to ensure walker did not get too far ahead of her.  Performed stand from ADL love seat at S level with cues for increased forward weight shift.  Performed car transfer with RW to simulated car height (27.5").  Note that she initially was going to let go of RW and grab door and frame to step in.  PT provided cues for turning to seat completely with RW then reaching back to sit, scooting posteriorly and then elevating LEs into car.  Pt did need very min A for LLE, however was able to lower LLE out of car independently.  Assisted pt back to room and updated son on cues to provide at home for both gait and car transfer.  Updated memory notebook and assisted pt back to bed via stand pivot with RW at S level.  Pt left in bed with bed alarm set and all needs in reach.    Therapy Documentation Precautions:  Precautions Precautions: None Restrictions Weight Bearing Restrictions: No   Vital Signs: Therapy Vitals Pulse Rate: 98 BP: 128/85 Patient Position (if appropriate): Sitting Oxygen Therapy SpO2: 97 % O2 Device: Room Air Pain: Pt reports no pain during session.      Therapy/Group: Individual Therapy  Denice Bors 07/08/2019, 10:03 AM

## 2019-07-08 NOTE — Progress Notes (Signed)
[place] PHYSICAL MEDICINE & REHABILITATION PROGRESS NOTE   Subjective/Complaints: still some HS confusion. "when can I go home?"   ROS: Patient denies fever, rash, sore throat, blurred vision, nausea, vomiting, diarrhea, cough, shortness of breath or chest pain, joint or back pain, headache, or mood change.    Objective:   No results found. Recent Labs    07/08/19 0459  WBC 10.7*  HGB 16.4*  HCT 48.7*  PLT 271   Recent Labs    07/08/19 0459  NA 137  K 3.9  CL 101  CO2 23  GLUCOSE 155*  BUN 26*  CREATININE 0.84  CALCIUM 9.1    Intake/Output Summary (Last 24 hours) at 07/08/2019 0742 Last data filed at 07/08/2019 0713 Gross per 24 hour  Intake 360 ml  Output 5 ml  Net 355 ml     Physical Exam: Vital Signs Blood pressure 128/85, pulse 98, temperature 97.7 F (36.5 C), resp. rate 18, height 5\' 3"  (1.6 m), weight 52 kg, SpO2 97 %. Constitutional: No distress . Vital signs reviewed. HEENT: EOMI, oral membranes moist Neck: supple Cardiovascular: RRR without murmur. No JVD    Respiratory: CTA Bilaterally without wheezes or rales. Normal effort    GI: BS +, non-tender, non-distended  Skin: Warm and dry.  Intact. Psych: Normal mood.  Normal behavior. Musc: No edema.  No tenderness. Neurological: oriented to person, hospital Alert, follows basic commands. Oriented to person, hospital, normal language Left eye ptosis ongoing.  Motor:  Limited due to participation RUE/RLE: Grossly 4/5 LUE/LE: 4/5 Ongoing mild left neglect   Skin: Missing 3rd and 5th toes right foot and 3rd toe left foot.  No changes Psychiatric: pleasant and cooperative  Assessment/Plan: 1. Functional deficits secondary to right temporal lobe/PCA distribution infarct which require 3+ hours per day of interdisciplinary therapy in a comprehensive inpatient rehab setting.  Physiatrist is providing close team supervision and 24 hour management of active medical problems listed  below.  Physiatrist and rehab team continue to assess barriers to discharge/monitor patient progress toward functional and medical goals  Care Tool:  Bathing    Body parts bathed by patient: Right arm, Left arm, Chest, Abdomen, Right upper leg, Left upper leg, Face, Front perineal area, Buttocks, Right lower leg, Left lower leg   Body parts bathed by helper: Left lower leg, Right lower leg     Bathing assist Assist Level: Contact Guard/Touching assist     Upper Body Dressing/Undressing Upper body dressing   What is the patient wearing?: Pull over shirt, Bra    Upper body assist Assist Level: Minimal Assistance - Patient > 75%    Lower Body Dressing/Undressing Lower body dressing      What is the patient wearing?: Pants, Incontinence brief     Lower body assist Assist for lower body dressing: Minimal Assistance - Patient > 75%     Toileting Toileting Toileting Activity did not occur (Clothing management and hygiene only): Refused  Toileting assist Assist for toileting: Moderate Assistance - Patient 50 - 74%     Transfers Chair/bed transfer  Transfers assist  Chair/bed transfer activity did not occur: Safety/medical concerns  Chair/bed transfer assist level: Contact Guard/Touching assist     Locomotion Ambulation   Ambulation assist   Ambulation activity did not occur: Safety/medical concerns  Assist level: Contact Guard/Touching assist Assistive device: Walker-rolling Max distance: 150   Walk 10 feet activity   Assist  Walk 10 feet activity did not occur: Safety/medical concerns  Assist level: Supervision/Verbal  cueing Assistive device: Walker-rolling   Walk 50 feet activity   Assist Walk 50 feet with 2 turns activity did not occur: Safety/medical concerns  Assist level: Supervision/Verbal cueing Assistive device: Walker-rolling    Walk 150 feet activity   Assist Walk 150 feet activity did not occur: Safety/medical concerns  Assist  level: Contact Guard/Touching assist Assistive device: Walker-rolling    Walk 10 feet on uneven surface  activity   Assist Walk 10 feet on uneven surfaces activity did not occur: Safety/medical concerns   Assist level: Contact Guard/Touching assist     Wheelchair     Assist Will patient use wheelchair at discharge?: No   Wheelchair activity did not occur: N/A         Wheelchair 50 feet with 2 turns activity    Assist    Wheelchair 50 feet with 2 turns activity did not occur: N/A       Wheelchair 150 feet activity     Assist Wheelchair 150 feet activity did not occur: N/A        Medical Problem List and Plan: 1.Left-sided weakness with slurred speechsecondary to acute early subacute ischemia within the posterior right temporal lobe within the PCA territory. 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    EEG negative for seizures  -gradually improving cognitively after reduction in keppra dose. Suspect some of HS confusion will improve once home. D/w son today  2. Antithrombotics: -DVT/anticoagulation:Eliquis -antiplatelet therapy: Aspirin 325 mg daily 3. Pain Management:Tylenol as needed 4. Mood:Provide emotional support -antipsychotic agents: N/A 5. Neuropsych: This patientisnot capable of making decisions on herown behalf. 6. Skin/Wound Care:Routine skin checks 7. Fluids/Electrolytes/Nutrition:encourage PO     8. PAF. Continue Eliquis    9. Hypertension.   Increased Toprol to 25mg  bid 8/12 Vitals:   07/07/19 2119 07/08/19 0645  BP: (!) 171/82 128/85  Pulse: 78 98  Resp:    Temp: 97.7 F (36.5 C)   SpO2: 59% 97%   Systolic HTN increased amlodipine to 10 mg, trending down  -avoid over-treatment of BP to avoid hypoperfusion 10. Diabetes mellitus with hyperglycemia. Hemoglobin A1c 7.4. SSI. Patient on Glucophage 500 mg twice  daily prior to admission.   Borderline/improving    -began metformin 250mg  bid on 8/19---follow for trend  -Cover with ssi as needed    11. Hyperlipidemia. Lipitor 12.Peripheral vascular disease with history of left SFA angioplasty and stenting in the past. Continue low-dose aspirin 13.Decreased nutritional storage. Dietary follow-up.   -protein supp 14.  Leukocytosis  WBCs 10.8 on 8/11  Continue to monitor   LOS: 10 days A FACE TO [place] 07/08/2019, 7:42 AM

## 2019-07-08 NOTE — Progress Notes (Signed)
Occupational Therapy Session Note  Patient Details  Name: [name] MRN: [id number] Date of Birth: 21-Sep-1928  Today's Date: 07/08/2019 OT Individual Time: [phone] OT Individual Time Calculation (min): 72 min    Short Term Goals: Week 1:  OT Short Term Goal 1 (Week 1): Pt will transfer to [facility] with mod A OT Short Term Goal 1 - Progress (Week 1): Met OT Short Term Goal 2 (Week 1): Pt will don pants with mod A OT Short Term Goal 2 - Progress (Week 1): Met OT Short Term Goal 3 (Week 1): Pt will don shirt with min A OT Short Term Goal 3 - Progress (Week 1): Met OT Short Term Goal 4 (Week 1): Pt will require no more than mod cueing for focused attention to 1 grooming task OT Short Term Goal 4 - Progress (Week 1): Met  Skilled Therapeutic Interventions/Progress Updates:    1;1. Pt received in bed awake with no pain reported. Pt supine>sitting EOB with S overall pt completes ambulation with RW with VC for upright posture (unable to achieve) and keeping feet inside RW with CGA for balance. Pt selects clothing out of dresser drawer with VC for Rw placement when reaching into low drawer. Pt bathes with CGA for standign to wash buttocks and VC for thorough buttock hygiene d/t small smear of stool on washcloth. Pt dresses at sit to stand level at sink with A only to hook bra/thread head into shirt and VC for locating needed items on far L of visual field requiring head turn. Pt demo difficulty this date orienting shirt to self. Pt requires MAX VC and increased time to problem solve threading BLE into underwear and patns. Pt able to advance pants past hips today with CGA. Pt able to don B socks and shoes with increased efficiency this date and set up. Pt completes oral care with no noticing/responding to spillage of toothpaste from mouth. Exited session with pt seated in w/c, call light in reach and al needs met  Therapy Documentation Precautions:  Precautions Precautions: None Restrictions Weight  Bearing Restrictions: No General:   Vital Signs: Therapy Vitals Pulse Rate: 98 BP: 128/85 Patient Position (if appropriate): Sitting Oxygen Therapy SpO2: 97 % O2 Device: Room Air Pain:   ADL: ADL Eating: Set up, Supervision/safety Where Assessed-Eating: Bed level Grooming: Contact guard Where Assessed-Grooming: Edge of bed Upper Body Bathing: Minimal assistance Where Assessed-Upper Body Bathing: Edge of bed Lower Body Bathing: Maximal assistance Where Assessed-Lower Body Bathing: Edge of bed Upper Body Dressing: Moderate assistance Where Assessed-Upper Body Dressing: Edge of bed Lower Body Dressing: Maximal assistance Where Assessed-Lower Body Dressing: Edge of bed Toileting: Maximal assistance Where Assessed-Toileting: Bedside Commode Toilet Transfer: Maximal assistance Toilet Transfer Method: Stand pivot Science writer: Extra wide bedside commode Vision   Perception    Praxis   Exercises:   Other Treatments:     Therapy/Group: Individual Therapy  Tonny Branch 07/08/2019, 7:29 AM

## 2019-07-09 ENCOUNTER — Inpatient Hospital Stay (HOSPITAL_COMMUNITY): Payer: Medicare Other | Admitting: Speech Pathology

## 2019-07-09 ENCOUNTER — Inpatient Hospital Stay (HOSPITAL_COMMUNITY): Payer: Medicare Other | Admitting: Physical Therapy

## 2019-07-09 ENCOUNTER — Inpatient Hospital Stay (HOSPITAL_COMMUNITY): Payer: Medicare Other

## 2019-07-09 LAB — GLUCOSE, CAPILLARY
Glucose-Capillary: 178 mg/dL — ABNORMAL HIGH (ref 70–99)
Glucose-Capillary: 218 mg/dL — ABNORMAL HIGH (ref 70–99)
Glucose-Capillary: 216 mg/dL — ABNORMAL HIGH (ref 70–99)
Glucose-Capillary: 136 mg/dL — ABNORMAL HIGH (ref 70–99)

## 2019-07-09 MED ORDER — METFORMIN HCL 500 MG PO TABS
500.0000 mg | ORAL_TABLET | Freq: Two times a day (BID) | ORAL | Status: DC
  Administered 2019-07-09 – 2019-07-13 (×8): 500 mg via ORAL
  Filled 2019-07-09 (×8): qty 1

## 2019-07-09 NOTE — Progress Notes (Addendum)
Occupational Therapy Session Note  Patient Details  Name: [name] MRN: [id number] Date of Birth: 12/30/1927  Today's Date: 07/08/2019  Late note OT Individual Time: 13:45-14:30 OT Individual Time Calculation (min): 45 min    Short Term Goals: Week 1:  OT Short Term Goal 1 (Week 1): Pt will transfer to [facility] with mod A OT Short Term Goal 1 - Progress (Week 1): Met OT Short Term Goal 2 (Week 1): Pt will don pants with mod A OT Short Term Goal 2 - Progress (Week 1): Met OT Short Term Goal 3 (Week 1): Pt will don shirt with min A OT Short Term Goal 3 - Progress (Week 1): Met OT Short Term Goal 4 (Week 1): Pt will require no more than mod cueing for focused attention to 1 grooming task OT Short Term Goal 4 - Progress (Week 1): Met Week 2:  OT Short Term Goal 1 (Week 2): STG = LTG d/t ELOS  Skilled Therapeutic Interventions/Progress Updates:    1:1 Pt able to perform bed mobility to come to EOB and perform transfer with RW to the w/c with min guard. Transitioned to the gym. Focus on dynamic standing balance. Performed stepping up and down on to 3 inch step 5 times and then transitioning to climbing stairs and then climb backwards challenging balance. Pt performing tasks with min A. Navigating through a obstacle course focusing on turning and stepping over thresholds.   Performing toileting with mod A for clothing management and min guard for transfers.   Left resting in the w/c with heating pad on her back. Son present in her room.   Therapy Documentation Precautions:  Precautions Precautions: None Restrictions Weight Bearing Restrictions: No Pain:  c/o mild back pain; allowed for rest breaks prn and donned heating pad iafterwards  Therapy/Group: Individual Therapy  Willeen Cass [facility] 07/09/2019, 12:36 PM

## 2019-07-09 NOTE — Progress Notes (Signed)
Speech Language Pathology Daily Session Note  Patient Details  Name: [name] MRN: [id number] Date of Birth: [birth year]  Today's Date: 07/09/2019 SLP Individual Time: 0725-0825 SLP Individual Time Calculation (min): 60 min  Short Term Goals: Week 2: SLP Short Term Goal 1 (Week 2): Patient will consume current diet with minimal overt s/s of aspiration with supervison level verbal cues for use of swallowing compensatory strategies. SLP Short Term Goal 2 (Week 2): Patient will sustain attention to functional tasks for 15 minutes with mod A multimodal cues SLP Short Term Goal 3 (Week 2): Patient will consistently demonstrate orientation x4 with min A multimodal cues SLP Short Term Goal 4 (Week 2): Patient will demonstrate functional recall of daily events with min A multimodal cues  Skilled Therapeutic Interventions: Skilled treatment session focused on dysphagia and cognitive goals. SLP facilitated session by providing skilled observation with breakfast meal of Dys. 3 textures with thin liquids without overt s/s of aspiration and overall Mod I for use of swallowing compensatory strategies. Patient was oriented X 4 with supervision level verbal cues and read her schedule aloud with extra time and Mod A verbal and visual cues. Patient also utilized her memory book with Mod A verbal cues. Patient requested to use the bathroom X 2 with Min verbal cues needed for safety with task. Patient left upright on commode with NT present. Continue with current plan of care.      Pain No/Denies Pain   Therapy/Group: Individual Therapy  Bayler Nehring 07/09/2019, 1:05 PM

## 2019-07-09 NOTE — Progress Notes (Signed)
Physical Therapy Session Note  Patient Details  Name: [name] MRN: [id number] Date of Birth: Feb 09, 1928  Today's Date: 07/09/2019 PT Individual Time: 1435-1530 PT Individual Time Calculation (min): 55 min   Short Term Goals: Week 2:  PT Short Term Goal 1 (Week 2): STG = LTG due to ELOS.  Skilled Therapeutic Interventions/Progress Updates:  Pt received in bed with son present & participating in hands on training during session. Pt reports need to use restroom and ambulates in room/bathroom with RW & CGA, requires slight min assist to manage clothing & CGA for balance with intermittent 1UE support. Pt transfers to elevated seat over toilet with CGA and has continent void on toilet. Pt performs hand hygiene standing at sink with CGA for balance and assistance with obtaining paper towels. Educated pt's son on use of gait belt & positioning on L of pt when assisting her with gait. [name] (pt's son) provided hands on assist & appropriate cuing for turning & ambulating within base of RW following instructional cuing from therapist, while pt ambulates room>elevators, ortho gym>outside main gym with RW & CGA. Pt completes car transfer at SUV simulated height (27.5") with RW & CGA with cuing to sit then place BLE in/out of car vs stepping in, with pt requiring extra time to maneuver BLE. Pt negotiates ramp with assistance from [place] with RW & CGA. Educated pt on inability to drive until cleared by MD, ambulate within house only, need for assistance with all mobility, reviewed home safety modifications (remove/secure throw rugs), using BSC at night, with [name] reporting they are hiring 24/7 care givers. [name] voices comfort with all education & training and has been checked off to ambulate with pt in room/to door and back & RN made aware. Pt left in recliner with BLE elevated  & chair alarm donned, encouraged pt to sit up through dinner if possible. Call bell in reach & son present to supervise.   Therapy  Documentation Precautions:  Precautions Precautions: None Restrictions Weight Bearing Restrictions: No  Pain: No formal c/o pain reported. K-pad applied to back at beginning & end of session for ongoing back pain.   Therapy/Group: Individual Therapy  [name] 07/09/2019, 3:43 PM

## 2019-07-09 NOTE — Progress Notes (Signed)
Occupational Therapy Session Note  Patient Details  Name: [name] MRN: [id number] Date of Birth: 03/08/1928  Today's Date: 07/09/2019 OT Individual Time: 1045-1200 OT Individual Time Calculation (min): 75 min    Short Term Goals: Week 2:  OT Short Term Goal 1 (Week 2): STG = LTG d/t ELOS  Skilled Therapeutic Interventions/Progress Updates:    1;1. Pt received in bed with son present. Pt completes supine>sitting with S and ambulates with CGA fading to S to transfer onto toilet and shower. Pt completes toileting with CGA and bathing with S overall with min VC for sequencing bathing body parts. Pt able to manage water hose this date as opposed to needing A from OT. Pt dresses with A only to hook bra today and able to don all LB clothing with CGA and VC for problem solving threading feet into pants. Pt requires A to blow dry hair d/t decreased shoulder strength. Pt plays connect 4 seated with VC for visual attention to L hand as pt often dropping checker, over reaching and fumbling of checker d/t decreased sensation fo rimproving GMC/FMC of LUE. Exited session with pt seated in w/c, call light in reach, exit alarm on and all needs met.   Therapy Documentation Precautions:  Precautions Precautions: None Restrictions Weight Bearing Restrictions: No General:   Vital Signs:  Pain:   ADL: ADL Eating: Set up, Supervision/safety Where Assessed-Eating: Bed level Grooming: Contact guard Where Assessed-Grooming: Edge of bed Upper Body Bathing: Minimal assistance Where Assessed-Upper Body Bathing: Edge of bed Lower Body Bathing: Maximal assistance Where Assessed-Lower Body Bathing: Edge of bed Upper Body Dressing: Moderate assistance Where Assessed-Upper Body Dressing: Edge of bed Lower Body Dressing: Maximal assistance Where Assessed-Lower Body Dressing: Edge of bed Toileting: Maximal assistance Where Assessed-Toileting: Bedside Commode Toilet Transfer: Maximal assistance Toilet  Transfer Method: Stand pivot Toilet Transfer Equipment: Extra wide bedside commode Vision   Perception    Praxis   Exercises:   Other Treatments:     Therapy/Group: Individual Therapy   M  07/09/2019, 12:07 PM 

## 2019-07-09 NOTE — Progress Notes (Signed)
[place] PHYSICAL MEDICINE & REHABILITATION PROGRESS NOTE   Subjective/Complaints: Pt up with SLP. Had a better night. Complains of mild low back pain  ROS: Patient denies fever, rash, sore throat, blurred vision, nausea, vomiting, diarrhea, cough, shortness of breath or chest pain,  headache, or mood change.     Objective:   No results found. Recent Labs    07/08/19 0459  WBC 10.7*  HGB 16.4*  HCT 48.7*  PLT 271   Recent Labs    07/08/19 0459  NA 137  K 3.9  CL 101  CO2 23  GLUCOSE 155*  BUN 26*  CREATININE 0.84  CALCIUM 9.1    Intake/Output Summary (Last 24 hours) at 07/09/2019 0930 Last data filed at 07/09/2019 0835 Gross per 24 hour  Intake 580 ml  Output -  Net 580 ml     Physical Exam: Vital Signs Blood pressure 101/90, pulse 81, temperature 98.6 F (37 C), resp. rate 18, height 5\' 3"  (1.6 m), weight 52 kg, SpO2 (!) 84 %. Constitutional: No distress . Vital signs reviewed. HEENT: EOMI, oral membranes moist Neck: supple Cardiovascular: IRR without murmur. No JVD    Respiratory: CTA Bilaterally without wheezes or rales. Normal effort    GI: BS +, non-tender, non-distended   Skin: Warm and dry.  Intact. Psych: Normal mood.  Normal behavior. Musc: No edema.  No tenderness. Neurological: oriented to person, hospital Alert, normal language. Attends a little better to left. Left eye ptosis present Motor:  Limited due to participation RUE/RLE: Grossly 4/5 LUE/LE: 4/5  Skin: Missing 3rd and 5th toes right foot and 3rd toe left foot.  No changes Psychiatric:interactive  Assessment/Plan: 1. Functional deficits secondary to right temporal lobe/PCA distribution infarct which require 3+ hours per day of interdisciplinary therapy in a comprehensive inpatient rehab setting.  Physiatrist is providing close team supervision and 24 hour management of active medical problems listed below.  Physiatrist and rehab team continue to assess barriers to  discharge/monitor patient progress toward functional and medical goals  Care Tool:  Bathing    Body parts bathed by patient: Right arm, Left arm, Chest, Abdomen, Right upper leg, Left upper leg, Face, Front perineal area, Buttocks, Right lower leg, Left lower leg   Body parts bathed by helper: Left lower leg, Right lower leg     Bathing assist Assist Level: Contact Guard/Touching assist     Upper Body Dressing/Undressing Upper body dressing   What is the patient wearing?: Pull over shirt, Bra    Upper body assist Assist Level: Minimal Assistance - Patient > 75%    Lower Body Dressing/Undressing Lower body dressing      What is the patient wearing?: Pants, Incontinence brief     Lower body assist Assist for lower body dressing: Minimal Assistance - Patient > 75%     Toileting Toileting Toileting Activity did not occur (Clothing management and hygiene only): Refused  Toileting assist Assist for toileting: Moderate Assistance - Patient 50 - 74%     Transfers Chair/bed transfer  Transfers assist  Chair/bed transfer activity did not occur: Safety/medical concerns  Chair/bed transfer assist level: Contact Guard/Touching assist     Locomotion Ambulation   Ambulation assist   Ambulation activity did not occur: Safety/medical concerns  Assist level: Contact Guard/Touching assist Assistive device: Walker-rolling Max distance: 150   Walk 10 feet activity   Assist  Walk 10 feet activity did not occur: Safety/medical concerns  Assist level: Contact Guard/Touching assist Assistive device: Walker-rolling  Walk 50 feet activity   Assist Walk 50 feet with 2 turns activity did not occur: Safety/medical concerns  Assist level: Contact Guard/Touching assist Assistive device: Walker-rolling    Walk 150 feet activity   Assist Walk 150 feet activity did not occur: Safety/medical concerns  Assist level: Contact Guard/Touching assist Assistive device:  Walker-rolling    Walk 10 feet on uneven surface  activity   Assist Walk 10 feet on uneven surfaces activity did not occur: Safety/medical concerns   Assist level: Contact Guard/Touching assist     Wheelchair     Assist Will patient use wheelchair at discharge?: No   Wheelchair activity did not occur: N/A         Wheelchair 50 feet with 2 turns activity    Assist    Wheelchair 50 feet with 2 turns activity did not occur: N/A       Wheelchair 150 feet activity     Assist Wheelchair 150 feet activity did not occur: N/A        Medical Problem List and Plan: 1.Left-sided weakness with slurred speechsecondary to acute early subacute ischemia within the posterior right temporal lobe within the PCA territory. Continue CIR  Post-stroke seizures,  now on keppra 250mg  bid   -Neuro following.    EEG negative for seizures  -gradually improving cognitively after reduction in keppra dose.   -continue same plan  2. Antithrombotics: -DVT/anticoagulation:Eliquis -antiplatelet therapy: Aspirin 325 mg daily 3. Pain Management:Tylenol as needed 4. Mood:Provide emotional support -antipsychotic agents: N/A 5. Neuropsych: This patientisnot capable of making decisions on herown behalf. 6. Skin/Wound Care:Routine skin checks 7. Fluids/Electrolytes/Nutrition:encourage PO     8. PAF. Continue Eliquis    9. Hypertension.   Increased Toprol to 25mg  bid 8/12 Vitals:   07/08/19 2055 07/09/19 0435  BP:  101/90  Pulse: 92 81  Resp:  18  Temp:  98.6 F (37 C)  SpO2: [id number] (!) [id number]   Systolic HTN increased amlodipine to 10 mg, trending down 8/21  -avoid over-treatment of BP to avoid hypoperfusion 10. Diabetes mellitus with hyperglycemia. Hemoglobin A1c 7.4. SSI. Patient on Glucophage 500 mg twice daily prior to admission.   Borderline/improving    -began metformin 250mg  bid on 8/19---increase to 500mg  bid  8/21  -Cover with ssi as needed    11. Hyperlipidemia. Lipitor 12.Peripheral vascular disease with history of left SFA angioplasty and stenting in the past. Continue low-dose aspirin 13.Decreased nutritional storage. Dietary follow-up.   -protein supp 14.  Leukocytosis  WBCs 10.8 on 8/11  Continue to monitor   LOS: 11 days A FACE TO [place] 07/09/2019, 9:30 AM

## 2019-07-10 ENCOUNTER — Inpatient Hospital Stay (HOSPITAL_COMMUNITY): Payer: Medicare Other | Admitting: Speech Pathology

## 2019-07-10 LAB — GLUCOSE, CAPILLARY
Glucose-Capillary: 168 mg/dL — ABNORMAL HIGH (ref 70–99)
Glucose-Capillary: 229 mg/dL — ABNORMAL HIGH (ref 70–99)
Glucose-Capillary: 152 mg/dL — ABNORMAL HIGH (ref 70–99)
Glucose-Capillary: 196 mg/dL — ABNORMAL HIGH (ref 70–99)

## 2019-07-10 NOTE — Progress Notes (Signed)
Speech Language Pathology Daily Session Note  Patient Details  Name: [name] MRN: [id number] Date of Birth: 09-21-1928  Today's Date: 07/10/2019 SLP Individual Time: 1300-1330 SLP Individual Time Calculation (min): 30 min  Short Term Goals: Week 2: SLP Short Term Goal 1 (Week 2): Patient will consume current diet with minimal overt s/s of aspiration with supervison level verbal cues for use of swallowing compensatory strategies. SLP Short Term Goal 2 (Week 2): Patient will sustain attention to functional tasks for 15 minutes with mod A multimodal cues SLP Short Term Goal 3 (Week 2): Patient will consistently demonstrate orientation x4 with min A multimodal cues SLP Short Term Goal 4 (Week 2): Patient will demonstrate functional recall of daily events with min A multimodal cues  Skilled Therapeutic Interventions: Skilled treatment session focused on cognitive goals. SLP facilitated session by providing Max A verbal and visual cues for visual scanning, recall and problem solving during a check writing task. Patient with limited awareness of errors and required Max verbal cues for anticipatory awareness in regards to needing assistance with bill paying when at home. Patient left upright in bed with son present and all needs within reach. Continue with current plan of care.      Pain No/Denies Pain   Therapy/Group: Individual Therapy  Jaelani Posa 07/10/2019, 2:03 PM

## 2019-07-10 NOTE — Progress Notes (Signed)
[name] is a 85 y.o. female 1927-12-20 [id number]  Subjective: Awoken with gentle rub.  Pleasant and denies problems.  Feels well  Objective: Vital signs in last 24 hours: Temp:  [97.7 F (36.5 C)-97.9 F (36.6 C)] 97.9 F (36.6 C) (08/22 0531) Pulse Rate:  [87-100] 94 (08/22 0531) Resp:  [16-17] 16 (08/22 0531) BP: (149-162)/(85-104) 154/85 (08/22 0531) SpO2:  [87 %-100 %] 97 % (08/22 0531) Weight change:  Last BM Date: 07/07/19  Intake/Output from previous day: 08/21 0701 - 08/22 0700 In: 720 [P.O.:720] Out: -   Physical Exam General: No apparent distress   sleeping supine in bed, easily awakened.  Son at bedside  lungs: Normal effort. Lungs clear to auscultation, no crackles or wheezes. Cardiovascular: Regular rate and rhythm, no edema Neurological: No new neurological deficits.  Persisting left hemiparesis and mild dysarthria   Lab Results: BMET    Component Value Date/Time   NA 137 07/08/2019 0459   K 3.9 07/08/2019 0459   CL 101 07/08/2019 0459   CO2 23 07/08/2019 0459   GLUCOSE 155 (H) 07/08/2019 0459   BUN 26 (H) 07/08/2019 0459   CREATININE 0.84 07/08/2019 0459   CREATININE 0.99 (H) 08/31/2018 1543   CALCIUM 9.1 07/08/2019 0459   GFRNONAA >60 07/08/2019 0459   GFRAA >60 07/08/2019 0459   CBC    Component Value Date/Time   WBC 10.7 (H) 07/08/2019 0459   RBC 5.12 (H) 07/08/2019 0459   HGB 16.4 (H) 07/08/2019 0459   HCT 48.7 (H) 07/08/2019 0459   PLT 271 07/08/2019 0459   MCV 95.1 07/08/2019 0459   MCH 32.0 07/08/2019 0459   MCHC 33.7 07/08/2019 0459   RDW 12.1 07/08/2019 0459   LYMPHSABS 1.4 06/29/2019 0551   MONOABS 1.1 (H) 06/29/2019 0551   EOSABS 0.2 06/29/2019 0551   BASOSABS 0.1 06/29/2019 0551   CBG's (last 3):   Recent Labs    07/09/19 1733 07/09/19 2141 07/10/19 0644  GLUCAP 216* 136* 168*   LFT's Lab Results  Component Value Date   ALT 20 06/29/2019   AST 23 06/29/2019   ALKPHOS 56 06/29/2019   BILITOT 1.8 (H)  06/29/2019    Studies/Results: No results found.  Medications:  I have reviewed the patient's current medications. Scheduled Medications: . amLODipine  10 mg Oral Daily  . apixaban  2.5 mg Oral BID  . aspirin EC  81 mg Oral Daily  . atorvastatin  40 mg Oral q1800  . insulin aspart  0-9 Units Subcutaneous TID WC  . levETIRAcetam  250 mg Oral BID  . metFORMIN  500 mg Oral BID WC  . metoprolol succinate  25 mg Oral Daily   PRN Medications: acetaminophen **OR** acetaminophen (TYLENOL) oral liquid 160 mg/5 mL **OR** acetaminophen, ALPRAZolam, Muscle Rub, senna-docusate, sorbitol  Assessment/Plan: Principal Problem:   Right temporal lobe infarction [facility]) Active Problems:   Essential hypertension   Long term (current) use of anticoagulants [Z79.01]   Hyperlipidemia   Controlled type 2 diabetes mellitus with hyperglycemia, without long-term current use of insulin (HCC)   PAF (paroxysmal atrial fibrillation) (HCC)   Seizure (HCC)   Memory loss   Length of stay, days: 12  1.  Right temporal lobe infarct with left-sided weakness and dysarthria.  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  Continue ongoing inpatient rehab therapy for supportive care as ongoing.  Eliquis for PAF history with aspirin 325 mg daily. 2.  Paroxysmal A. fib.  On Eliquis anticoagulation. 3.  Hypertension.  Permissive hypertension, suboptimally controlled blood pressure.  Continue titration of ongoing meds as needed. 4.  Dyslipidemia post stroke.  On atorvastatin 5.  Type 2 diabetes on metformin + SSI.  Monitor with variable oral intake.  Valerie A. Asa Lente, MD 07/10/2019, 10:46 AM

## 2019-07-11 ENCOUNTER — Inpatient Hospital Stay (HOSPITAL_COMMUNITY): Payer: Medicare Other | Admitting: Physical Therapy

## 2019-07-11 LAB — GLUCOSE, CAPILLARY
Glucose-Capillary: 145 mg/dL — ABNORMAL HIGH (ref 70–99)
Glucose-Capillary: 174 mg/dL — ABNORMAL HIGH (ref 70–99)
Glucose-Capillary: 193 mg/dL — ABNORMAL HIGH (ref 70–99)
Glucose-Capillary: 146 mg/dL — ABNORMAL HIGH (ref 70–99)
Glucose-Capillary: 181 mg/dL — ABNORMAL HIGH (ref 70–99)

## 2019-07-11 NOTE — Progress Notes (Signed)
[name] is a 85 y.o. female [birth year] [id number]  Subjective: Awake and anxious to get dressed.  Pleasant and denies problems.  Feels well  Objective: Vital signs in last 24 hours: Temp:  [97.8 F (36.6 C)-98 F (36.7 C)] 97.8 F (36.6 C) (08/23 0350) Pulse Rate:  [72-84] 84 (08/23 0350) Resp:  [16-18] 16 (08/23 0350) BP: (108-148)/(66-82) 132/82 (08/23 0350) SpO2:  [98 %-100 %] 100 % (08/23 0350) Weight change:  Last BM Date: 07/07/19  Intake/Output from previous day: 08/22 0701 - 08/23 0700 In: 360 [P.O.:360] Out: -   Physical Exam General: No apparent distress.  Son at bedside  lungs: Normal effort. Lungs clear to auscultation, no crackles or wheezes. Cardiovascular: Regular rate and rhythm, no edema Neurological: No new neurological deficits.  Persisting left hemiparesis and mild dysarthria   Lab Results: BMET    Component Value Date/Time   NA 137 07/08/2019 0459   K 3.9 07/08/2019 0459   CL 101 07/08/2019 0459   CO2 23 07/08/2019 0459   GLUCOSE 155 (H) 07/08/2019 0459   BUN 26 (H) 07/08/2019 0459   CREATININE 0.84 07/08/2019 0459   CREATININE 0.99 (H) 08/31/2018 1543   CALCIUM 9.1 07/08/2019 0459   GFRNONAA >60 07/08/2019 0459   GFRAA >60 07/08/2019 0459   CBC    Component Value Date/Time   WBC 10.7 (H) 07/08/2019 0459   RBC 5.12 (H) 07/08/2019 0459   HGB 16.4 (H) 07/08/2019 0459   HCT 48.7 (H) 07/08/2019 0459   PLT 271 07/08/2019 0459   MCV 95.1 07/08/2019 0459   MCH 32.0 07/08/2019 0459   MCHC 33.7 07/08/2019 0459   RDW 12.1 07/08/2019 0459   LYMPHSABS 1.4 06/29/2019 0551   MONOABS 1.1 (H) 06/29/2019 0551   EOSABS 0.2 06/29/2019 0551   BASOSABS 0.1 06/29/2019 0551   CBG's (last 3):   Recent Labs    07/10/19 2110 07/11/19 0619 07/11/19 0732  GLUCAP 196* 145* 174*   LFT's Lab Results  Component Value Date   ALT 20 06/29/2019   AST 23 06/29/2019   ALKPHOS 56 06/29/2019   BILITOT 1.8 (H) 06/29/2019    Studies/Results: No  results found.  Medications:  I have reviewed the patient's current medications. Scheduled Medications: . amLODipine  10 mg Oral Daily  . apixaban  2.5 mg Oral BID  . aspirin EC  81 mg Oral Daily  . atorvastatin  40 mg Oral q1800  . insulin aspart  0-9 Units Subcutaneous TID WC  . levETIRAcetam  250 mg Oral BID  . metFORMIN  500 mg Oral BID WC  . metoprolol succinate  25 mg Oral Daily   PRN Medications: acetaminophen **OR** acetaminophen (TYLENOL) oral liquid 160 mg/5 mL **OR** acetaminophen, ALPRAZolam, Muscle Rub, senna-docusate, sorbitol  Assessment/Plan: Principal Problem:   Right temporal lobe infarction [facility]) Active Problems:   Essential hypertension   Long term (current) use of anticoagulants [Z79.01]   Hyperlipidemia   Controlled type 2 diabetes mellitus with hyperglycemia, without long-term current use of insulin (HCC)   PAF (paroxysmal atrial fibrillation) (HCC)   Seizure (HCC)   Memory loss   Length of stay, days: 13  1.  Right temporal lobe infarct with left-sided weakness and dysarthria.  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  Continue ongoing inpatient rehab therapy for supportive care as ongoing.  Eliquis for PAF history with aspirin 325 mg daily. 2.  Paroxysmal A. fib.  On Eliquis anticoagulation. 3.  Hypertension.  Permissive hypertension, suboptimally  controlled blood pressure.  Continue titration of ongoing meds as needed. 4.  Dyslipidemia post stroke.  On atorvastatin 5.  Type 2 diabetes on metformin + SSI.  Monitor with variable oral intake.   A. Asa Lente, MD 07/11/2019, 9:43 AM

## 2019-07-11 NOTE — Progress Notes (Signed)
Physical Therapy Session Note  Patient Details  Name: [name] MRN: [id number] Date of Birth: 03-Jan-1928  Today's Date: 07/11/2019 PT Individual Time: 0900-0957 PT Individual Time Calculation (min): 57 min   Short Term Goals: Week 1:  PT Short Term Goal 1 (Week 1): Pt will participate in 30 min of OOB activity w/o increase in fatigue PT Short Term Goal 1 - Progress (Week 1): Progressing toward goal PT Short Term Goal 2 (Week 1): Pt will transfer sit<>stand w/ mod assist consistently PT Short Term Goal 2 - Progress (Week 1): Met PT Short Term Goal 3 (Week 1): Pt will perform bed mobility w/ mod assist PT Short Term Goal 3 - Progress (Week 1): Met  Skilled Therapeutic Interventions/Progress Updates:  Pt was seen bedside in the am with son at bedside. Pt transferred supine to edge of bed with min A and verbal cues. Pt performed multiple sit to stand and stand pivot transfers with rolling walker and c.g with verbal cues. Pt transported to rehab gym. In gym treatment focused on NMR utilizing step taps and alternating step taps, 3 sets x 10 reps each. Pt ambulated 50 feet x 2 with rolling walker and c.g. Pt returned to room and left sitting up in w/c with son at bedside.   Therapy Documentation Precautions:  Precautions Precautions: None Restrictions Weight Bearing Restrictions: No General:   Pain: No c/o pain.    Therapy/Group: Individual Therapy  Dub Amis 07/11/2019, 11:54 AM

## 2019-07-12 ENCOUNTER — Inpatient Hospital Stay (HOSPITAL_COMMUNITY): Payer: Medicare Other | Admitting: Physical Therapy

## 2019-07-12 ENCOUNTER — Inpatient Hospital Stay (HOSPITAL_COMMUNITY): Payer: Medicare Other | Admitting: Speech Pathology

## 2019-07-12 ENCOUNTER — Inpatient Hospital Stay (HOSPITAL_COMMUNITY): Payer: Medicare Other

## 2019-07-12 LAB — GLUCOSE, CAPILLARY
Glucose-Capillary: 173 mg/dL — ABNORMAL HIGH (ref 70–99)
Glucose-Capillary: 129 mg/dL — ABNORMAL HIGH (ref 70–99)
Glucose-Capillary: 148 mg/dL — ABNORMAL HIGH (ref 70–99)
Glucose-Capillary: 150 mg/dL — ABNORMAL HIGH (ref 70–99)

## 2019-07-12 MED ORDER — APIXABAN 2.5 MG PO TABS: 2.5000 mg | ORAL_TABLET | Freq: Two times a day (BID) | ORAL | 0 refills | Status: AC

## 2019-07-12 MED ORDER — ALUM & MAG HYDROXIDE-SIMETH 200-200-20 MG/5ML PO SUSP: 15.0000 mL | Freq: Four times a day (QID) | ORAL | Status: DC | PRN

## 2019-07-12 MED ORDER — METFORMIN HCL 500 MG PO TABS: 500.0000 mg | ORAL_TABLET | Freq: Two times a day (BID) | ORAL | 0 refills | Status: AC

## 2019-07-12 MED ORDER — ASPIRIN 81 MG PO TBEC: 81.0000 mg | DELAYED_RELEASE_TABLET | Freq: Every day | ORAL | Status: AC

## 2019-07-12 MED ORDER — ALPRAZOLAM 0.25 MG PO TABS: 0.2500 mg | ORAL_TABLET | Freq: Four times a day (QID) | ORAL | 0 refills | Status: DC | PRN

## 2019-07-12 MED ORDER — ATORVASTATIN CALCIUM 40 MG PO TABS: 40.0000 mg | ORAL_TABLET | Freq: Every day | ORAL | 0 refills | Status: AC

## 2019-07-12 MED ORDER — PANTOPRAZOLE SODIUM 40 MG PO TBEC: 40.0000 mg | DELAYED_RELEASE_TABLET | Freq: Every day | ORAL | 0 refills | Status: AC

## 2019-07-12 MED ORDER — METOPROLOL SUCCINATE ER 25 MG PO TB24: 25.0000 mg | ORAL_TABLET | Freq: Every day | ORAL | 0 refills | Status: AC

## 2019-07-12 MED ORDER — AMLODIPINE BESYLATE 10 MG PO TABS: 10.0000 mg | ORAL_TABLET | Freq: Every day | ORAL | 0 refills | Status: AC

## 2019-07-12 MED ORDER — LEVETIRACETAM 250 MG PO TABS: 250.0000 mg | ORAL_TABLET | Freq: Two times a day (BID) | ORAL | 0 refills | Status: DC

## 2019-07-12 MED ORDER — PANTOPRAZOLE SODIUM 40 MG PO TBEC
40.0000 mg | DELAYED_RELEASE_TABLET | Freq: Every day | ORAL | Status: DC
  Administered 2019-07-12 – 2019-07-13 (×2): 40 mg via ORAL
  Filled 2019-07-12 (×2): qty 1

## 2019-07-12 NOTE — Progress Notes (Signed)
Speech Language Pathology Discharge Summary  Patient Details  Name: [name] MRN: [id number] Date of Birth: [birth year]  Today's Date: 07/12/2019 SLP Individual Time: 0920-1015 SLP Individual Time Calculation (min): 55 min   Skilled Therapeutic Interventions:  Skilled treatment session focused on cognitive goals and completion of family education with the patient's son. Upon arrival, patient was awake but lethargic. SLP facilitated session by [place]. Patient scored 19/30 points with continued deficits in recall, however, patient's score increased by 10 points since initial evaluation. Patient's son present and educated in regards to patient's current cognitive deficits and strategies to utilize at home to maximize attention, recall and overall safety with functional tasks. He was also educated in regards to current swallowing function, diet recommendations and appropriate textures. He verbalized understanding of all information and handouts were also given for reinforcement. Patient left supine in bed with son present.   Patient has met 7 of 7 long term goals.  Patient to discharge at overall Mod;Min level.   Reasons goals not met: N/A   Clinical Impression/Discharge Summary: Patient has made excellent gains and has met 7 of 7 LTGs this admission. Currently, patient is consuming Dys. 3 textures with thin liquids with minimal overt s/s of aspiration and overall supervision level verbal cues for use of swallowing compensatory strategies. Patient also demonstrates improved cognitive functioning and requires overall Min-Mod A verbal and visual cues to complete functional and familiar tasks safely in regards to problem solving, attention, awareness and recall with use of strategies. Patient and family education is complete and patient will discharge home with 24 hour supervision from family. Patient would benefit from f/u SLP services to maximize her swallowing and cognitive  functioning and overall functional independence in order tor reduce caregiver burden.   Care Partner:  Caregiver Able to Provide Assistance: Yes  Type of Caregiver Assistance: Physical;Cognitive  Recommendation:  24 hour supervision/assistance;Home Health SLP  Rationale for SLP Follow Up: Maximize cognitive function and independence;Maximize swallowing safety;Reduce caregiver burden   Equipment: N/A   Reasons for discharge: Discharged from hospital;Treatment goals met   Patient/Family Agrees with Progress Made and Goals Achieved: Yes    Mitsuye Schrodt 07/12/2019, 6:23 AM

## 2019-07-12 NOTE — Progress Notes (Signed)
[facility] PHYSICAL MEDICINE & REHABILITATION PROGRESS NOTE   Subjective/Complaints: C/o reflux  This morning after breakfast. Experienced once and a while at home  ROS: Patient denies fever, rash, sore throat, blurred vision,  vomiting, diarrhea, cough, shortness of breath or chest pain, joint or back pain, headache, or mood change.    Objective:   No results found. No results for input(s): WBC, HGB, HCT, PLT in the last 72 hours. No results for input(s): NA, K, CL, CO2, GLUCOSE, BUN, CREATININE, CALCIUM in the last 72 hours.  Intake/Output Summary (Last 24 hours) at 07/12/2019 0939 Last data filed at 07/11/2019 1300 Gross per 24 hour  Intake 360 ml  Output -  Net 360 ml     Physical Exam: Vital Signs Blood pressure 125/75, pulse 74, temperature (!) 97.4 F (36.3 C), resp. rate 18, height 5\' 3"  (1.6 m), weight 52 kg, SpO2 98 %. Constitutional: No distress . Vital signs reviewed. HEENT: EOMI, oral membranes moist Neck: supple Cardiovascular: RRR without murmur. No JVD    Respiratory: CTA Bilaterally without wheezes or rales. Normal effort    GI: BS +, non-tender, non-distended  Skin: Warm and dry.  Intact. Psych: Normal mood.  Normal behavior. Musc: No edema.  No tenderness. Neurological: oriented to person, hospital Alert, normal language. Attends a little better to left. Left eye ptosis present Motor:  Limited due to participation RUE/RLE: Grossly 4/5 LUE/LE: 4/5  Skin: Missing 3rd and 5th toes right foot and 3rd toe left foot.  No changes Psychiatric:interactive  Assessment/Plan: 1. Functional deficits secondary to right temporal lobe/PCA distribution infarct which require 3+ hours per day of interdisciplinary therapy in a comprehensive inpatient rehab setting.  Physiatrist is providing close team supervision and 24 hour management of active medical problems listed below.  Physiatrist and rehab team continue to assess barriers to discharge/monitor patient  progress toward functional and medical goals  Care Tool:  Bathing    Body parts bathed by patient: Right arm, Left arm, Chest, Abdomen, Right upper leg, Left upper leg, Face, Front perineal area, Buttocks, Right lower leg, Left lower leg   Body parts bathed by helper: Left lower leg, Right lower leg     Bathing assist Assist Level: Contact Guard/Touching assist     Upper Body Dressing/Undressing Upper body dressing   What is the patient wearing?: Pull over shirt, Bra    Upper body assist Assist Level: Minimal Assistance - Patient > 75%    Lower Body Dressing/Undressing Lower body dressing      What is the patient wearing?: Pants, Incontinence brief     Lower body assist Assist for lower body dressing: Minimal Assistance - Patient > 75%     Toileting Toileting Toileting Activity did not occur (Clothing management and hygiene only): Refused  Toileting assist Assist for toileting: Moderate Assistance - Patient 50 - 74%     Transfers Chair/bed transfer  Transfers assist  Chair/bed transfer activity did not occur: Safety/medical concerns  Chair/bed transfer assist level: Contact Guard/Touching assist     Locomotion Ambulation   Ambulation assist   Ambulation activity did not occur: Safety/medical concerns  Assist level: Contact Guard/Touching assist Assistive device: Walker-rolling Max distance: 50   Walk 10 feet activity   Assist  Walk 10 feet activity did not occur: Safety/medical concerns  Assist level: Contact Guard/Touching assist Assistive device: Walker-rolling   Walk 50 feet activity   Assist Walk 50 feet with 2 turns activity did not occur: Safety/medical concerns  Assist level: Contact  Guard/Touching assist Assistive device: Walker-rolling    Walk 150 feet activity   Assist Walk 150 feet activity did not occur: Safety/medical concerns  Assist level: Contact Guard/Touching assist Assistive device: Walker-rolling    Walk 10 feet  on uneven surface  activity   Assist Walk 10 feet on uneven surfaces activity did not occur: Safety/medical concerns   Assist level: Contact Guard/Touching assist     Wheelchair     Assist Will patient use wheelchair at discharge?: No   Wheelchair activity did not occur: N/A         Wheelchair 50 feet with 2 turns activity    Assist    Wheelchair 50 feet with 2 turns activity did not occur: N/A       Wheelchair 150 feet activity     Assist Wheelchair 150 feet activity did not occur: N/A        Medical Problem List and Plan: 1.Left-sided weakness with slurred speechsecondary to acute early subacute ischemia within the posterior right temporal lobe within the PCA territory. Continue CIR--ELOS 8/25  -Patient to see Rehab MD/provider in the office for transitional care encounter in 1-2 weeks.   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    EEG negative for seizures  -gradually improving cognitively after reduction in keppra dose.   -continue same plan  2. Antithrombotics: -DVT/anticoagulation:Eliquis -antiplatelet therapy: Aspirin 325 mg daily 3. Pain Management:Tylenol as needed 4. Mood:Provide emotional support -antipsychotic agents: N/A 5. Neuropsych: This patientisnot capable of making decisions on herown behalf. 6. Skin/Wound Care:Routine skin checks 7. Fluids/Electrolytes/Nutrition:encourage PO     8. PAF. Continue Eliquis    9. Hypertension.   Increased Toprol to 25mg  bid 8/12 Vitals:   07/11/19 1939 07/12/19 0444  BP: 134/84 125/75  Pulse: 75 74  Resp: 18 18  Temp: (!) 97.5 F (36.4 C) (!) 97.4 F (36.3 C)  SpO2: 98% 98%     amlodipine to 10 mg with control 10. Diabetes mellitus with hyperglycemia. Hemoglobin A1c 7.4. SSI. Patient on Glucophage 500 mg twice daily prior to admission.   -improved control   - metformin 500mg  bid 8/21  -Cover with ssi as  needed    11. Hyperlipidemia. Lipitor 12.Peripheral vascular disease with history of left SFA angioplasty and stenting in the past. Continue low-dose aspirin 13.Decreased nutritional storage. Dietary follow-up.   -protein supp 14.  Leukocytosis  WBCs 10.8 on 8/11  Continue to monitor 15. GERD:  Begin protonix, prn maalox   LOS: 14 days A FACE TO FACE EVALUATION WAS PERFORMED  Meredith Staggers 07/12/2019, 9:39 AM

## 2019-07-12 NOTE — Progress Notes (Signed)
Occupational Therapy Discharge Summary  Patient Details  Name: [name] MRN: [id number] Date of Birth: [birth year]  Today's Date: 07/12/2019 OT Individual Time: 1100-1200 OT Individual Time Calculation (min): 60 min    Patient has met 8 of 8 long term goals due to improved activity tolerance, improved balance, postural control, ability to compensate for deficits, functional use of  LEFT upper extremity, improved attention, improved awareness and improved coordination.  Patient to discharge at [facility] Assist level.  Patient's care partner is independent to provide the necessary physical and cognitive assistance at discharge.    Reasons goals not met: All treatment goals met.   Recommendation:  Patient will benefit from ongoing skilled OT services in home health setting to continue to advance functional skills in the area of BADL.  Equipment: No equipment provided  Reasons for discharge: treatment goals met and discharge from hospital  Patient/family agrees with progress made and goals achieved: Yes   Skilled OT Intervention: Pt received supine with son present no c/o pain.. Discussed upcoming d/c and OT recommendations for continued LUE NMR and safety duriung ADLs. Pt completed bed mobility with close (S) using bed rails. Pt completed sit > stand from EOB with CGA using RW, cuenig required for hand placement. Pt completed ambulatory transfer into bathroom and onto toilet with CGA-min A. Pt required mod cueing for RW management and L attention. Pt completed toileting task (+urine) with min A for clothing management. Pt completed transfer into walk in shower with CGA cueing for safety. Pt washed UB/LB with CGA for standing balance. Intermittent cueing for completing feet seated. Pt donned shirt with set up assist, min A for bra. Min A to don pants. Pt and son given handout re [facility] activities to do at home for carryover. Pt left sitting up with son present, all needs met.   OT  Discharge Precautions/Restrictions  Precautions Precautions: None Restrictions Weight Bearing Restrictions: No  Pain Pain Assessment Pain Scale: 0-10 Pain Score: 0-No pain ADL ADL Eating: Set up, Supervision/safety Where Assessed-Eating: Bed level Grooming: Supervision/safety Where Assessed-Grooming: Sitting at sink Upper Body Bathing: Supervision/safety Where Assessed-Upper Body Bathing: Shower Lower Body Bathing: Contact guard Where Assessed-Lower Body Bathing: Shower Upper Body Dressing: Supervision/safety Where Assessed-Upper Body Dressing: Wheelchair Lower Body Dressing: Minimal cueing, Minimal assistance Where Assessed-Lower Body Dressing: Wheelchair Toileting: Minimal assistance Where Assessed-Toileting: Glass blower/designer: Therapist, music Method: Counselling psychologist: Energy manager: Curator Method: Heritage manager: Civil engineer, contracting with back Vision Baseline Vision/History: Wears glasses Wears Glasses: Reading only Patient Visual Report: No change from baseline Vision Assessment?: No apparent visual deficits Perception  Perception: Impaired Inattention/Neglect: Does not attend to left visual field Praxis Praxis: Impaired Praxis Impairment Details: Perseveration;Initiation;Motor planning Cognition Overall Cognitive Status: History of cognitive impairments - at baseline Arousal/Alertness: Lethargic Orientation Level: Oriented to person;Oriented to place;Oriented to time;Oriented to situation Attention: Sustained Focused Attention: Appears intact Sustained Attention: Impaired Sustained Attention Impairment: Verbal basic;Functional basic Memory: Impaired Memory Impairment: Decreased short term memory;Retrieval deficit;Decreased recall of new information Decreased Short Term Memory: Verbal basic Awareness: Impaired Awareness Impairment: Anticipatory  impairment Problem Solving: Impaired Problem Solving Impairment: Functional basic;Functional complex Executive Function: Organizing;Initiating;Self Correcting;Self Monitoring Organizing: Impaired Organizing Impairment: Verbal basic Initiating Impairment: Functional basic;Verbal basic Self Monitoring: Impaired Self Monitoring Impairment: Verbal basic;Functional basic Self Correcting: Impaired Self Correcting Impairment: Verbal basic;Functional basic Safety/Judgment: Impaired Sensation Sensation Light Touch: Impaired Detail Light Touch Impaired Details: Impaired LUE Proprioception: Impaired  by gross assessment Coordination Gross Motor Movements are Fluid and Coordinated: No Fine Motor Movements are Fluid and Coordinated: No Coordination and Movement Description: Generalized weakness, L hemi with poor coordination Finger Nose Finger Test: undershoots, slow Motor  Motor Motor: Other (comment);Hemiplegia Motor - Discharge Observations: L hemi, generalized weakness Mobility  Bed Mobility Bed Mobility: Supine to Sit Supine to Sit: Contact Guard/Touching assist Sit to Supine: Contact Guard/Touching assist Transfers Sit to Stand: Contact Guard/Touching assist Stand to Sit: Contact Guard/Touching assist  Trunk/Postural Assessment  Cervical Assessment Cervical Assessment: Exceptions to WFL(cervical spine flexed forward significantly. Large Lipoma present along C7 region) Thoracic Assessment Thoracic Assessment: Exceptions to WFL(significant kyphosis) Lumbar Assessment Lumbar Assessment: Exceptions to WFL(posterior pelvic tilt) Postural Control Postural Control: Deficits on evaluation Righting Reactions: delayed  Balance Balance Balance Assessed: Yes Static Sitting Balance Static Sitting - Balance Support: Feet supported Static Sitting - Level of Assistance: 6: Modified independent (Device/Increase time) Dynamic Sitting Balance Dynamic Sitting - Balance Support: Feet  supported;No upper extremity supported Dynamic Sitting - Level of Assistance: 5: Stand by assistance Static Standing Balance Static Standing - Balance Support: Bilateral upper extremity supported;During functional activity Static Standing - Level of Assistance: 5: Stand by assistance Dynamic Standing Balance Dynamic Standing - Balance Support: Bilateral upper extremity supported;During functional activity Dynamic Standing - Level of Assistance: 4: Min assist Extremity/Trunk Assessment RUE Assessment RUE Assessment: Exceptions to [facility] General Strength Comments: 3+/5 MMT LUE Assessment LUE Assessment: Exceptions to [facility] [facility] General Strength Comments: 3/5 MMT, incoordination and sensation deficit present.   Curtis Sites 07/12/2019, 11:41 AM

## 2019-07-12 NOTE — Progress Notes (Signed)
Physical Therapy Discharge Summary  Patient Details  Name: [name] MRN: [id number] Date of Birth: [birth year]  Today's Date: 07/12/2019 PT Individual Time: 1349-1500 PT Individual Time Calculation (min): 71 min    Patient has met 10 of 10 long term goals due to improved activity tolerance, improved balance, improved postural control, increased strength, ability to compensate for deficits, functional use of  left upper extremity and left lower extremity, improved attention and improved awareness.  Patient to discharge at an ambulatory level CGA with RW.   Patient's care partner is independent to provide the necessary physical and cognitive assistance at discharge.  Reasons goals not met: n/a  Recommendation:  Patient will benefit from ongoing skilled PT services in home health setting to continue to advance safe functional mobility, address ongoing impairments in balance, NMR, transfers, bed mobility, gait with AD, stair negotiation, endurance, global strengthening, and minimize fall risk.  Equipment: No equipment provided - pt already has RW & w/c for community use  Reasons for discharge: treatment goals met and discharge from hospital  Patient/family agrees with progress made and goals achieved: Yes  Skilled PT Treatment: Pt received in bed & agreeable to tx. Pt's son present reporting comfort with taking pt home tomorrow. Pt completes bed mobility with bed flat, no rails with supervision and extra time. Pt ambulates bed>w/c with RW & CGA. Transported pt to gym via w/c dependent assist for time management. Pt completes car transfer at SUV simulated height with CGA, negotiates ramp with RW & CGA, and negotiates mulch with RW & min assist. Pt continues to require cuing to not push RW too far out in front of her. Pt ambulates 100 ft with RW & CGA with occasional cuing to attend to L side of visual field and cuing to not push RW too far out in front of her. Pt propels w/c ~50 ft with BUE &  min assist. Pt negotiates 12 steps (6" + 3") with B rails and min assist.  Pt performed B hip/knee flexion marching exercises with RW for BUE support and task focusing on BLE strengthening. Pt utilized nu-step on level 3 x 10 minutes with all four extremities with task focusing on coordination of reciprocal movements, global strengthening, endurance training & L NMR.  At end of session pt reports need to use restroom and ambulates in room/bathroom with RW & CGA, with cuing to hold RW and turn to toilet. Pt managed clothing with CGA for balance. Pt left on toilet with son in room to supervise & NT & RN aware of pt's location and need to assist her when pt is finished using the restroom.   Pain: pt c/o L side 8/10 back pain & rest breaks provided PRN, k-pad applied at beginning of session, & pain meds requested & administered from RN.  PT Discharge Precautions/Restrictions Precautions Precautions: Fall Restrictions Weight Bearing Restrictions: No  Vision/Perception  Pt reports she wears glasses for reading only at baseline. Pt reports blurry vision in L eye and difficulty focusing eyes to read now.  Perception Perception: Impaired Inattention/Neglect: Decreased attention to L visual field & L side of body.  Cognition Overall Cognitive Status: Impaired/Different from baseline Arousal/Alertness: Awake/alert Orientation Level: Oriented to person;Oriented to place;Oriented to time;Oriented to situation Memory: Impaired Memory Impairment: Decreased recall of new information;Decreased short term memory Awareness: Impaired Awareness Impairment: Anticipatory impairment Problem Solving: Impaired Problem Solving Impairment: Functional basic;Functional complex Safety/Judgment: Impaired   Sensation Sensation Light Touch: Not tested Proprioception: Not tested Coordination Gross  Motor Movements are Fluid and Coordinated: No Fine Motor Movements are Fluid and Coordinated: No Coordination and  Movement Description: Generalized weakness, L hemi with poor coordination  Motor  Motor Motor: Abnormal postural alignment and control Motor - Discharge Observations: generalized deconditioning, L hemiparesis   Mobility Bed Mobility Bed Mobility: Rolling Right;Rolling Left;Sit to Supine;Supine to Sit Rolling Right: Supervision/verbal cueing Rolling Left: Supervision/Verbal cueing Supine to Sit: Supervision/Verbal cueing Sit to Supine: Supervision/Verbal cueing Transfers Transfers: Sit to Stand;Stand to Sit Sit to Stand: Contact Guard/Touching assist Stand to Sit: Contact Guard/Touching assist Transfer (Assistive device): Rolling walker   Locomotion  Gait Ambulation: Yes Gait Assistance: Contact Guard/Touching assist Gait Distance (Feet): 150 Feet Assistive device: Rolling walker Gait Assistance Details: verbal cues to ambulate within base of RW as pt frequently pushes it too far out in front of her Gait Gait: Yes Gait Pattern: Impaired Gait Pattern: (forward trunk lean, narrow BOS, impulsive with reduced safety awareness as noted through increased gait speed, decreased weight shifting L<>R) Stairs / Additional Locomotion Stairs: Yes Stairs Assistance: Minimal Assistance - Patient > 75% Stair Management Technique: Two rails Number of Stairs: 12 Height of Stairs: (6" + 3") Ramp: Minimal Assistance - Patient >75% Wheelchair Mobility Wheelchair Mobility: Yes Wheelchair Assistance: Minimal assistance - Patient >75% Wheelchair Propulsion: Both upper extremities Wheelchair Parts Management: Needs assistance Distance: 50 ft   Trunk/Postural Assessment  Cervical Assessment Cervical Assessment: Exceptions to WFL(cervical spine flexed forward significantly. Large Lipoma present along C7 region) Thoracic Assessment Thoracic Assessment: Exceptions to WFL(significant kyphosis) Lumbar Assessment Lumbar Assessment: Exceptions to WFL(posterior pelvic tilt) Postural  Control Postural Control: Deficits on evaluation Righting Reactions: delayed   Balance Balance Balance Assessed: Yes Dynamic Standing Balance Dynamic Standing - Balance Support: Bilateral upper extremity supported;During functional activity Dynamic Standing - Level of Assistance: 4: Min assist   Extremity Assessment  RUE Assessment RUE Assessment: Exceptions to [facility] General Strength Comments: 3+/5 MMT LUE Assessment LUE Assessment: Exceptions to [facility] General Strength Comments: 3/5 MMT, incoordination and sensation deficit present.  BLE grossly 3+/5 as to pt able to weight bear without buckling noted, strength not formally tested   [name] 07/12/2019, 3:48 PM

## 2019-07-12 NOTE — Discharge Summary (Signed)
Physician Discharge Summary  Patient ID: [name] MRN: [id number] DOB/AGE: 83/10/1928 83 y.o.  Admit date: 06/28/2019 Discharge date: 07/13/2019  Discharge Diagnoses:  Principal Problem:   Right temporal lobe infarction [facility]) Active Problems:   Essential hypertension   Long term (current) use of anticoagulants [Z79.01]   Hyperlipidemia   Controlled type 2 diabetes mellitus with hyperglycemia, without long-term current use of insulin (HCC)   PAF (paroxysmal atrial fibrillation) (HCC)   Seizure ([place])   Memory loss   Discharged Condition: Stable  Significant Diagnostic Studies: Ct Head Wo Contrast  Result Date: 06/29/2019 CLINICAL DATA:  Altered mental status with unclear cause EXAM: CT HEAD WITHOUT CONTRAST TECHNIQUE: Contiguous axial images were obtained from the base of the skull through the vertex without intravenous contrast. COMPARISON:  Brain MRI from 2 days ago FINDINGS: Brain: No evidence of acute infarction, hemorrhage, hydrocephalus, extra-axial collection or mass lesion/mass effect. Chronic lacune in the left frontal white matter above the frontal horn. Age normal cerebral volume. Vascular: No hyperdense vessel or unexpected calcification. Skull: Osteopenic appearance.  No acute finding or lesion. Sinuses/Orbits: Opacified right frontal sinus that is stable and incidental to the history IMPRESSION: Stable exam.  No acute or reversible finding. Electronically Signed   By: Monte Fantasia M.D.   On: 06/29/2019 11:40   Ct Head Wo Contrast  Result Date: 06/26/2019 CLINICAL DATA:  Tingling in left hand. EXAM: CT HEAD WITHOUT CONTRAST TECHNIQUE: Contiguous axial images were obtained from the base of the skull through the vertex without intravenous contrast. COMPARISON:  October 13, 2007 FINDINGS: Brain: No subdural, epidural, or subarachnoid hemorrhage. Ventricles and sulci are unremarkable. Cerebellum, brainstem, and basal cisterns are normal. Mild white matter changes identified.  No acute cortical ischemia or infarct is noted. No mass effect or midline shift is identified. Vascular: Calcified atherosclerosis is seen in the intracranial carotids. Skull: Normal. Negative for fracture or focal lesion. Sinuses/Orbits: Opacification of the right frontal sinus is identified. Paranasal sinuses, mastoid air cells, and middle ears are otherwise normal. Other: None. IMPRESSION: 1. No acute intracranial abnormalities are identified. Electronically Signed   By: Dorise Bullion III M.D   On: 06/26/2019 19:55   Mr Angio Head Wo Contrast  Result Date: 06/27/2019 CLINICAL DATA:  Left-sided facial droop and slurred speech with left hand weakness. Three episodes in last 2 days. EXAM: MRI HEAD WITHOUT CONTRAST MRA HEAD WITHOUT CONTRAST TECHNIQUE: Multiplanar, multiecho pulse sequences of the brain and surrounding structures were obtained without intravenous contrast. Angiographic images of the head were obtained using MRA technique without contrast. COMPARISON:  Head CT 06/26/2019 Brain MRI 09/24/2009 FINDINGS: MRI HEAD FINDINGS BRAIN: There are 2-3 small areas of abnormal diffusion restriction within the posterior right temporal lobe (series 5, image 63). The white matter signal is normal for the patient's age. The cerebral and cerebellar volume are age-appropriate. There is no hydrocephalus. The midline structures are normal. VASCULAR: There are 2 scattered foci of chronic microhemorrhage in the supratentorial brain. The major intracranial arterial and venous sinus flow voids are normal. SKULL AND UPPER CERVICAL SPINE: Calvarial bone marrow signal is normal. There is no skull base mass. The visualized upper cervical spine and soft tissues are normal. SINUSES/ORBITS: There are no fluid levels or advanced mucosal thickening. The mastoid air cells and middle ear cavities are free of fluid. The orbits are normal. MRA HEAD FINDINGS POSTERIOR CIRCULATION: --Vertebral arteries: Normal V4 segments. --Posterior  inferior cerebellar arteries (PICA): The left PICA and AICA share a common origin.  Normal origin of right PICA from the ipsilateral vertebral artery. --Anterior inferior cerebellar arteries (AICA): Patent origins from the basilar artery. --Basilar artery: Normal. --Superior cerebellar arteries: Normal. --Posterior cerebral arteries (PCA): The right PCA is occluded at the P2-3 junction with distal reconstitution. The left PCA is occluded as short segment of the proximal P2 segment. The distal left PCA is patent. ANTERIOR CIRCULATION: --Intracranial internal carotid arteries: Normal. --Anterior cerebral arteries (ACA): Multifocal stenosis of the left anterior cerebral artery with areas of intermittent loss of normal flow related enhancement. --Middle cerebral arteries (MCA): Short segment loss of enhancement of the right middle cerebral artery distal M1 segment. The M2 branches are normal. The left M1 segment is normal. There is moderate stenosis of the left M2 inferior division. IMPRESSION: 1. 2-3 small foci of acute to early subacute ischemia within the posterior right temporal lobe, within the PCA territory. 2. 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 3. Short segment occlusion of the right MCA M1 segment, just proximal to the bifurcation, with normal M2 branches. 4. Moderate stenosis of the left M2 inferior division. Electronically Signed   By: Ulyses Jarred M.D.   On: 06/27/2019 00:42   Mr Brain Wo Contrast  Result Date: 06/27/2019 CLINICAL DATA:  Left-sided facial droop and slurred speech with left hand weakness. Three episodes in last 2 days. EXAM: MRI HEAD WITHOUT CONTRAST MRA HEAD WITHOUT CONTRAST TECHNIQUE: Multiplanar, multiecho pulse sequences of the brain and surrounding structures were obtained without intravenous contrast. Angiographic images of the head were obtained using MRA technique without contrast.  COMPARISON:  Head CT 06/26/2019 Brain MRI 09/24/2009 FINDINGS: MRI HEAD FINDINGS BRAIN: There are 2-3 small areas of abnormal diffusion restriction within the posterior right temporal lobe (series 5, image 63). The white matter signal is normal for the patient's age. The cerebral and cerebellar volume are age-appropriate. There is no hydrocephalus. The midline structures are normal. VASCULAR: There are 2 scattered foci of chronic microhemorrhage in the supratentorial brain. The major intracranial arterial and venous sinus flow voids are normal. SKULL AND UPPER CERVICAL SPINE: Calvarial bone marrow signal is normal. There is no skull base mass. The visualized upper cervical spine and soft tissues are normal. SINUSES/ORBITS: There are no fluid levels or advanced mucosal thickening. The mastoid air cells and middle ear cavities are free of fluid. The orbits are normal. MRA HEAD FINDINGS POSTERIOR CIRCULATION: --Vertebral arteries: Normal V4 segments. --Posterior inferior cerebellar arteries (PICA): The left PICA and AICA share a common origin. Normal origin of right PICA from the ipsilateral vertebral artery. --Anterior inferior cerebellar arteries (AICA): Patent origins from the basilar artery. --Basilar artery: Normal. --Superior cerebellar arteries: Normal. --Posterior cerebral arteries (PCA): The right PCA is occluded at the P2-3 junction with distal reconstitution. The left PCA is occluded as short segment of the proximal P2 segment. The distal left PCA is patent. ANTERIOR CIRCULATION: --Intracranial internal carotid arteries: Normal. --Anterior cerebral arteries (ACA): Multifocal stenosis of the left anterior cerebral artery with areas of intermittent loss of normal flow related enhancement. --Middle cerebral arteries (MCA): Short segment loss of enhancement of the right middle cerebral artery distal M1 segment. The M2 branches are normal. The left M1 segment is normal. There is moderate stenosis of the left M2  inferior division. IMPRESSION: 1. 2-3 small foci of acute to early subacute ischemia within the posterior right temporal lobe, within the PCA territory. 2. Short segment occlusions of  both posterior cerebral arteries, the P2-3 junction on the right and proximal P2 segment on the left. 3. Short segment occlusion of the right MCA M1 segment, just proximal to the bifurcation, with normal M2 branches. 4. Moderate stenosis of the left M2 inferior division. Electronically Signed   By: Ulyses Jarred M.D.   On: 06/27/2019 00:42   Vas US Carotid (at [place] Only)  Result Date: 06/27/2019 Carotid Arterial Duplex Study Indications:       TIA. Risk Factors:      Hypertension, hyperlipidemia, Diabetes, PAD. Comparison Study:  no prior Performing Technologist: Abram Sander RVS  Examination Guidelines: A complete evaluation includes B-mode imaging, spectral Doppler, color Doppler, and power Doppler as needed of all accessible portions of each vessel. Bilateral testing is considered an integral part of a complete examination. Limited examinations for reoccurring indications may be performed as noted.  Right Carotid Findings: +----------+--------+--------+--------+------------------------+--------+           PSV cm/sEDV cm/sStenosisDescribe                Comments +----------+--------+--------+--------+------------------------+--------+ CCA Prox  48      10              calcific and homogeneous         +----------+--------+--------+--------+------------------------+--------+ CCA Distal38      11              homogeneous                      +----------+--------+--------+--------+------------------------+--------+ ICA Prox  31      11      1-39%   homogeneous                      +----------+--------+--------+--------+------------------------+--------+ ICA Distal36      11                                               +----------+--------+--------+--------+------------------------+--------+  ECA       42                                                       +----------+--------+--------+--------+------------------------+--------+ +----------+--------+-------+--------+-------------------+           PSV cm/sEDV cmsDescribeArm Pressure (mmHG) +----------+--------+-------+--------+-------------------+ [id number]                                         +----------+--------+-------+--------+-------------------+ +---------+--------+--+--------+--+---------+ VertebralPSV cm/s42EDV cm/s13Antegrade +---------+--------+--+--------+--+---------+  Left Carotid Findings: +----------+--------+--------+--------+--------+--------+           PSV cm/sEDV cm/sStenosisDescribeComments +----------+--------+--------+--------+--------+--------+ CCA Prox  46      8               calcific         +----------+--------+--------+--------+--------+--------+ CCA Distal58      16              calcific         +----------+--------+--------+--------+--------+--------+ ICA Prox  59      13      1-39%   calcific         +----------+--------+--------+--------+--------+--------+  ICA Distal45      17                               +----------+--------+--------+--------+--------+--------+ ECA       74      11                               +----------+--------+--------+--------+--------+--------+ +----------+--------+--------+--------+-------------------+ SubclavianPSV cm/sEDV cm/sDescribeArm Pressure (mmHG) +----------+--------+--------+--------+-------------------+           66                                          +----------+--------+--------+--------+-------------------+ +---------+--------+--+--------+-+---------+ VertebralPSV cm/s30EDV cm/s8Antegrade +---------+--------+--+--------+-+---------+  Summary: Right Carotid: Velocities in the right ICA are consistent with a 1-39% stenosis. Left Carotid: Velocities in the left ICA are consistent with a  1-39% stenosis. Vertebrals: Bilateral vertebral arteries demonstrate antegrade flow. *See table(s) above for measurements and observations.  Electronically signed by Harold Barban MD on 06/27/2019 at 12:29:25 PM.    Final     Labs:  Basic Metabolic Panel: Recent Labs  Lab 07/08/19 0459  NA 137  K 3.9  CL 101  CO2 23  GLUCOSE 155*  BUN 26*  CREATININE 0.84  CALCIUM 9.1    CBC: Recent Labs  Lab 07/08/19 0459  WBC 10.7*  HGB 16.4*  HCT 48.7*  MCV 95.1  PLT 271    CBG: Recent Labs  Lab 07/11/19 0619 07/11/19 0732 07/11/19 1210 07/11/19 1725 07/11/19 2054  GLUCAP 145* 174* 193* 146* 181*   Family history.  Mother with cancer as well as hypertension and hyperlipidemia.  Denies any diabetes mellitus  Brief HPI:   [name] is an 85 year old right-handed female history of PAF maintained on Coumadin, peripheral vascular disease status post left SFA angioplasty and stenting, diabetes mellitus, hypertension and hyperlipidemia.  Per chart review lives with spouse 1 level home with ramped entrance ambulated with a rolling walker.  Performing her own ADLs modified independent.  Presented 06/26/2019 with left-sided weakness and slurred speech that had been transient over 2 days.  No reports of chest pain or shortness of breath.  Blood pressure in the ED 143/95-213/112.  INR on admission of 2.00, COVID negative.  Cranial CT scan negative for acute changes.  Patient did not receive TPA.  MRI of the brain showed small foci of acute early subacute ischemia within the posterior right temporal lobe within the PCA territory.  MR angiogram showed short segment occlusion of both posterior cerebral arteries the P2-3 junction on the right and proximal P2 segment on the left.  Short segment occlusion of the right MCA M1 segment just proximal to the bifurcation with normal M2 branches.  Carotid Dopplers with no ICA stenosis.  Echocardiogram with ejection fraction of [id number] normal systolic function.   Neurology consulted Coumadin transitioned to Eliquis as well as  aspirin.  Tolerating a regular diet.  Patient was admitted for a comprehensive rehab program.  Hospital Course: [name] was admitted to rehab 06/28/2019 for inpatient therapies to consist of PT, ST and OT at least three hours five days a week. Past admission physiatrist, therapy team and rehab RN have worked together to provide customized collaborative inpatient rehab.  Pertaining to patient acute early subacute ischemia within the posterior right temporal  lobe within the PCA territory remained stable.  She continued on  aspirin as well as Eliquis.  Maintained on Keppra for seizure prophylaxis.  Patient would follow neurology services.  Pain managed with use of Tylenol.  History of PAF continued on Eliquis.  Blood pressure controlled on Toprol 25 mg twice daily as well as Norvasc 10 mg daily.  Mood stabilization with Xanax as needed and emotional support provided.  Hemoglobin A1c 7.4 and patient maintained on Glucophage 500 mg twice daily and would follow-up with primary MD.  Decreased nutritional storage protein supplement provided dietary follow-up.  No nausea vomiting.  Physical exam.  Blood pressure 168/100 pulse 74 temperature 97.7 respirations 17 oxygen saturations 99% room air Constitutional.  Well-developed no distress HEENT Head.  Normocephalic and atraumatic Eyes.  Pupils round and reactive to light without discharge no nystagmus Neck supple nontender no JVD no thyromegaly Cardiovascular  exam reveals no friction rub or murmur heard irregular irregular Neurological.  Patient sitting up in chair follows commands oriented to person, place, reason she is here with extra time.  Left upper extremity 3+ to 4- out of 5, left lower extremity 3+ to 4- out of 5 proximal to 4 out of 5 distally.  Right upper extremity right lower extremity 4 out of 5.  Senses pain and light touch in all fours. Skin.  Missing third and fifth right foot  toes and third toe left foot  Rehab course: During patient's stay in rehab weekly team conferences were held to monitor patient's progress, set goals and discuss barriers to discharge. At admission, patient required minimal assist supine to sit, minimal assist sit to supine, minimal assist sit to stand, minimal assist stand pivot transfers.  Minimal assist upper body bathing moderate assist lower body bathing minimal assist upper body dressing moderate assist lower body dressing minimal assist toilet transfers  She  has had improvement in activity tolerance, balance, postural control as well as ability to compensate for deficits. She has had improvement in functional use RUE/LUE  and RLE/LLE as well as improvement in awareness.  Patient transferred supine to edge of bed with minimal assist and verbal cues.  Perform multiple sit to stand and stand pivot transfers with rolling walker and contact-guard with verbal cues.  Patient transported to the gym ambulated 50 feet x 2 with rolling walker contact-guard.  Patient had performed bed mobility to come to edge of bed and perform transfers with rolling walker to the wheelchair with minimal guard.  Focused on dynamic standing balance.  Perform stepping up and down on 3 inch steps 5 times.  Speech therapy follow-up facilitated sessions by providing max assist verbal and visual cues for visual scanning, recall and problem solving during a check writing task.  Patient with limited awareness of errors and required max verbal cues for anticipatory awareness in regards to needing assistance with bill paying when at home.  Full family teaching completed plan discharge to home       Disposition: Discharge to home   Diet: Mechanical soft with diabetic restrictions  Special Instructions: No driving smoking or alcohol  Medications at discharge 1.  Tylenol as needed 2.  Xanax 0.25 mg every 6 hours as needed anxiety 3.  Norvasc 10 mg p.o. daily 4.  Eliquis 2.5 mg  p.o. twice daily 5.  Aspirin 81 mg p.o. daily 6.  Lipitor 40 mg p.o. daily 7.  Keppra 250 mg p.o. twice daily 8.  Glucophage 500 mg p.o. twice daily 9.  Toprol-XL  25 mg daily 10.Protoniox 40 mg daily   Discharge Instructions    Ambulatory referral to Neurology   Complete by: As directed    Follow up with stroke clinic NP (Jessica Vanschaick or Cecille Rubin, if both not available, consider Zachery Dauer, or [facility]) at [facility] in about 4 weeks. Thanks.   Ambulatory referral to Physical Medicine Rehab   Complete by: As directed    Moderate complexity follow-up 1 to 2 weeks right temporal lobe infarction      Follow-up Information    Meredith Staggers, MD Follow up.   Specialty: Physical Medicine and Rehabilitation Why: Office to call for appointment Contact information: [place] [address] [phone]        [facility]. Schedule an appointment as soon as possible for a visit in 4 week(s).   Specialty: Neurology Contact information: [address] [place] [phone]          Signed: Cathlyn Parsons 07/12/2019, 5:45 AM

## 2019-07-13 LAB — GLUCOSE, CAPILLARY: Glucose-Capillary: 143 mg/dL — ABNORMAL HIGH (ref 70–99)

## 2019-07-13 NOTE — Progress Notes (Signed)
Patient anticipates discharge today- home with family, resting no acute distress or discomfort

## 2019-07-13 NOTE — Progress Notes (Signed)
Social Work Discharge Note   The overall goal for the admission was met for:   Discharge location: Yes - home with family and paid caregivers  Length of Stay: Yes - 10 days  Discharge activity level: Yes  Home/community participation: Yes  Services provided included: MD, RD, PT, OT, SLP, RN, Pharmacy, Neuropsych and SW  Financial Services: Medicare and Private Insurance: [employer] supplement  Follow-up services arranged: Home Health: PT/OT/ST and Patient/Family request agency HH: [facility], DME: Pt has all recommended DME at home already, so none ordered.  Comments (or additional information):  Pt's son, [name], was here for family education and he will share what he's learned with his father, siblings, and paid caregivers.    Patient/Family verbalized understanding of follow-up arrangements: Yes  Individual responsible for coordination of the follow-up plan:  Pt's family.  Son was present daily, Cathalina Barcia - [phone]  Confirmed correct DME delivered: Trey Sailors 07/13/2019    Cassara Nida, Silvestre Mesi

## 2019-07-13 NOTE — Plan of Care (Signed)
  Problem: Consults Goal: RH STROKE PATIENT EDUCATION Description: See Patient Education module for education specifics  Outcome: Completed/Met Goal: Nutrition Consult-if indicated Outcome: Completed/Met Goal: Diabetes Guidelines if Diabetic/Glucose > 140 Description: If diabetic or lab glucose is > 140 mg/dl - Initiate Diabetes/Hyperglycemia Guidelines & Document Interventions  Outcome: Completed/Met   Problem: RH BOWEL ELIMINATION Goal: RH STG MANAGE BOWEL WITH ASSISTANCE Description: STG Manage Bowel with Min Assistance. Outcome: Completed/Met Goal: RH STG MANAGE BOWEL W/MEDICATION W/ASSISTANCE Description: STG Manage Bowel with Medication with [place]. Outcome: Completed/Met   Problem: RH BLADDER ELIMINATION Goal: RH STG MANAGE BLADDER WITH ASSISTANCE Description: STG Manage Bladder With Min Assistance Outcome: Completed/Met   Problem: RH SKIN INTEGRITY Goal: RH STG SKIN FREE OF INFECTION/BREAKDOWN Outcome: Completed/Met Goal: RH STG MAINTAIN SKIN INTEGRITY WITH ASSISTANCE Description: STG Maintain Skin Integrity With [place]. Outcome: Completed/Met Goal: RH STG ABLE TO PERFORM INCISION/WOUND CARE W/ASSISTANCE Description: STG Able To Perform Incision/Wound Care With [employer]. Outcome: Completed/Met   Problem: RH SAFETY Goal: RH STG ADHERE TO SAFETY PRECAUTIONS W/ASSISTANCE/DEVICE Description: STG Adhere to Safety Precautions With Min Assistance/Device. Outcome: Completed/Met   Problem: RH COGNITION-NURSING Goal: RH STG USES MEMORY AIDS/STRATEGIES W/ASSIST TO PROBLEM SOLVE Description: STG Uses Memory Aids/Strategies With Assistance to Problem Solve. Outcome: Completed/Met   Problem: RH PAIN MANAGEMENT Goal: RH STG PAIN MANAGED AT OR BELOW PT'S PAIN GOAL Outcome: Completed/Met   Problem: RH KNOWLEDGE DEFICIT Goal: RH STG INCREASE KNOWLEDGE OF DIABETES Outcome: Completed/Met Goal: RH STG INCREASE KNOWLEDGE OF HYPERTENSION Outcome:  Completed/Met

## 2019-07-13 NOTE — Progress Notes (Signed)
Patient discharged home at [place]  with family and all discharge instructions given by Silvestre Mesi PA. Family denies any questions. Patient denies any pain and assessment findings stable.

## 2019-07-13 NOTE — Progress Notes (Signed)
[place] PHYSICAL MEDICINE & REHABILITATION PROGRESS NOTE   Subjective/Complaints: No new issues. Didn't like breakfast. Anxious to get home. Family at bedside  ROS: Patient denies fever, rash, sore throat, blurred vision, nausea, vomiting, diarrhea, cough, shortness of breath or chest pain, joint or back pain, headache, or mood change.   Objective:   No results found. No results for input(s): WBC, HGB, HCT, PLT in the last 72 hours. No results for input(s): NA, K, CL, CO2, GLUCOSE, BUN, CREATININE, CALCIUM in the last 72 hours.  Intake/Output Summary (Last 24 hours) at 07/13/2019 0844 Last data filed at 07/12/2019 1822 Gross per 24 hour  Intake 236 ml  Output -  Net 236 ml     Physical Exam: Vital Signs Blood pressure (!) 145/76, pulse 70, temperature 98.4 F (36.9 C), resp. rate 14, height 5\' 3"  (1.6 m), weight 52 kg, SpO2 96 %. Constitutional: No distress . Vital signs reviewed. HEENT: EOMI, oral membranes moist Neck: supple Cardiovascular: RRR without murmur. No JVD    Respiratory: CTA Bilaterally without wheezes or rales. Normal effort    GI: BS +, non-tender, non-distended  Skin: Warm and dry.  Intact. Psych: Normal mood.  Normal behavior. Musc: No edema.  No tenderness. Neurological: oriented to person, hospital Alert, normal language. Remains a little dysarthric. Left inattention. Motor:    RUE/RLE: Grossly 4+/5 LUE/LE: 4/5  Skin: Missing 3rd and 5th toes right foot and 3rd toe left foot.  No changes Psychiatric:interactive  Assessment/Plan: 1. Functional deficits secondary to right temporal lobe/PCA distribution infarct which require 3+ hours per day of interdisciplinary therapy in a comprehensive inpatient rehab setting.  Physiatrist is providing close team supervision and 24 hour management of active medical problems listed below.  Physiatrist and rehab team continue to assess barriers to discharge/monitor patient progress toward functional and medical  goals  Care Tool:  Bathing    Body parts bathed by patient: Right arm, Left arm, Chest, Abdomen, Right upper leg, Left upper leg, Face, Front perineal area, Buttocks, Right lower leg, Left lower leg   Body parts bathed by helper: Left lower leg, Right lower leg     Bathing assist Assist Level: Contact Guard/Touching assist     Upper Body Dressing/Undressing Upper body dressing   What is the patient wearing?: Pull over shirt    Upper body assist Assist Level: Supervision/Verbal cueing    Lower Body Dressing/Undressing Lower body dressing      What is the patient wearing?: Pants, Incontinence brief     Lower body assist Assist for lower body dressing: Minimal Assistance - Patient > 75%     Toileting Toileting Toileting Activity did not occur Landscape architect and hygiene only): Refused  Toileting assist Assist for toileting: Minimal Assistance - Patient > 75%     Transfers Chair/bed transfer  Transfers assist  Chair/bed transfer activity did not occur: Safety/medical concerns  Chair/bed transfer assist level: Contact Guard/Touching assist     Locomotion Ambulation   Ambulation assist   Ambulation activity did not occur: Safety/medical concerns  Assist level: Contact Guard/Touching assist Assistive device: Walker-rolling Max distance: 150 ft   Walk 10 feet activity   Assist  Walk 10 feet activity did not occur: Safety/medical concerns  Assist level: Contact Guard/Touching assist Assistive device: Walker-rolling   Walk 50 feet activity   Assist Walk 50 feet with 2 turns activity did not occur: Safety/medical concerns  Assist level: Contact Guard/Touching assist Assistive device: Walker-rolling    Walk 150 feet activity  Assist Walk 150 feet activity did not occur: Safety/medical concerns  Assist level: Contact Guard/Touching assist Assistive device: Walker-rolling    Walk 10 feet on uneven surface  activity   Assist Walk 10 feet  on uneven surfaces activity did not occur: Safety/medical concerns   Assist level: Minimal Assistance - Patient > 75% Assistive device: Aeronautical engineer Will patient use wheelchair at discharge?: No Type of Wheelchair: Manual Wheelchair activity did not occur: N/A  Wheelchair assist level: Minimal Assistance - Patient > 75% Max wheelchair distance: 50 ft    Wheelchair 50 feet with 2 turns activity    Assist    Wheelchair 50 feet with 2 turns activity did not occur: N/A   Assist Level: Minimal Assistance - Patient > 75%   Wheelchair 150 feet activity     Assist Wheelchair 150 feet activity did not occur: Safety/medical concerns        Medical Problem List and Plan: 1.Left-sided weakness with slurred speechsecondary to acute early subacute ischemia within the posterior right temporal lobe within the PCA territory. -dc home today  -Patient to see Rehab MD/provider in the office for transitional care encounter in 1-2 weeks.   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 Antithrombotics: -DVT/anticoagulation:Eliquis -antiplatelet therapy: Aspirin 325 mg daily 3. Pain Management:Tylenol as needed 4. Mood:Provide emotional support -antipsychotic agents: N/A 5. Neuropsych: This patientisnot capable of making decisions on herown behalf. 6. Skin/Wound Care:Routine skin checks 7. Fluids/Electrolytes/Nutrition:encourage PO     8. PAF. Continue Eliquis    9. Hypertension.   Increased Toprol to 25mg  bid 8/12 Vitals:   07/12/19 2027 07/13/19 0327  BP: 138/81 (!) 145/76  Pulse: 62 70  Resp:  14  Temp: (!) 97.5 F (36.4 C) 98.4 F (36.9 C)  SpO2: 96% 96%     amlodipine to 10 mg with control 8/25 10. Diabetes mellitus with hyperglycemia. Hemoglobin A1c 7.4. SSI. Patient on Glucophage 500 mg twice daily prior to admission.   -improved control   - metformin  500mg  bid 8/21  -further adjustment as outpt as necessary    11. Hyperlipidemia. Lipitor 12.Peripheral vascular disease with history of left SFA angioplasty and stenting in the past. Continue low-dose aspirin 13.Decreased nutritional storage.    -family encouraging appropriate intake  -protein supp 14.  Leukocytosis  WBCs 10.8 on 8/11  Continue to monitor 15. GERD:  Begin protonix, prn maalox   LOS: 15 days A FACE TO FACE EVALUATION WAS PERFORMED  Meredith Staggers 07/13/2019, 8:44 AM

## 2019-07-15 ENCOUNTER — Telehealth: Payer: Self-pay

## 2019-07-15 DIAGNOSIS — Z9181 History of falling: Secondary | ICD-10-CM | POA: Diagnosis not present

## 2019-07-15 DIAGNOSIS — Z89421 Acquired absence of other right toe(s): Secondary | ICD-10-CM | POA: Diagnosis not present

## 2019-07-15 DIAGNOSIS — D649 Anemia, unspecified: Secondary | ICD-10-CM | POA: Diagnosis not present

## 2019-07-15 DIAGNOSIS — I480 Paroxysmal atrial fibrillation: Secondary | ICD-10-CM | POA: Diagnosis not present

## 2019-07-15 DIAGNOSIS — I8393 Asymptomatic varicose veins of bilateral lower extremities: Secondary | ICD-10-CM | POA: Diagnosis not present

## 2019-07-15 DIAGNOSIS — M810 Age-related osteoporosis without current pathological fracture: Secondary | ICD-10-CM | POA: Diagnosis not present

## 2019-07-15 DIAGNOSIS — I69354 Hemiplegia and hemiparesis following cerebral infarction affecting left non-dominant side: Secondary | ICD-10-CM | POA: Diagnosis not present

## 2019-07-15 DIAGNOSIS — I10 Essential (primary) hypertension: Secondary | ICD-10-CM | POA: Diagnosis not present

## 2019-07-15 DIAGNOSIS — E1140 Type 2 diabetes mellitus with diabetic neuropathy, unspecified: Secondary | ICD-10-CM | POA: Diagnosis not present

## 2019-07-15 DIAGNOSIS — M4800 Spinal stenosis, site unspecified: Secondary | ICD-10-CM | POA: Diagnosis not present

## 2019-07-15 DIAGNOSIS — E1151 Type 2 diabetes mellitus with diabetic peripheral angiopathy without gangrene: Secondary | ICD-10-CM | POA: Diagnosis not present

## 2019-07-15 DIAGNOSIS — E785 Hyperlipidemia, unspecified: Secondary | ICD-10-CM | POA: Diagnosis not present

## 2019-07-15 DIAGNOSIS — Z7982 Long term (current) use of aspirin: Secondary | ICD-10-CM | POA: Diagnosis not present

## 2019-07-15 DIAGNOSIS — Z7901 Long term (current) use of anticoagulants: Secondary | ICD-10-CM | POA: Diagnosis not present

## 2019-07-15 DIAGNOSIS — K219 Gastro-esophageal reflux disease without esophagitis: Secondary | ICD-10-CM | POA: Diagnosis not present

## 2019-07-15 DIAGNOSIS — G5603 Carpal tunnel syndrome, bilateral upper limbs: Secondary | ICD-10-CM | POA: Diagnosis not present

## 2019-07-15 DIAGNOSIS — Z89422 Acquired absence of other left toe(s): Secondary | ICD-10-CM | POA: Diagnosis not present

## 2019-07-15 DIAGNOSIS — M1990 Unspecified osteoarthritis, unspecified site: Secondary | ICD-10-CM | POA: Diagnosis not present

## 2019-07-15 DIAGNOSIS — M5136 Other intervertebral disc degeneration, lumbar region: Secondary | ICD-10-CM | POA: Diagnosis not present

## 2019-07-15 DIAGNOSIS — I69328 Other speech and language deficits following cerebral infarction: Secondary | ICD-10-CM | POA: Diagnosis not present

## 2019-07-15 DIAGNOSIS — M069 Rheumatoid arthritis, unspecified: Secondary | ICD-10-CM | POA: Diagnosis not present

## 2019-07-15 DIAGNOSIS — Z7984 Long term (current) use of oral hypoglycemic drugs: Secondary | ICD-10-CM | POA: Diagnosis not present

## 2019-07-15 DIAGNOSIS — Z955 Presence of coronary angioplasty implant and graft: Secondary | ICD-10-CM | POA: Diagnosis not present

## 2019-07-15 DIAGNOSIS — R32 Unspecified urinary incontinence: Secondary | ICD-10-CM | POA: Diagnosis not present

## 2019-07-15 DIAGNOSIS — E1165 Type 2 diabetes mellitus with hyperglycemia: Secondary | ICD-10-CM | POA: Diagnosis not present

## 2019-07-15 NOTE — Telephone Encounter (Signed)
Transitional Care call-Bonnie-Daughter in law    1. Are you/is patient experiencing any problems since coming home? No Are there any questions regarding any aspect of care? No 2. Are there any questions regarding medications administration/dosing? No Are meds being taken as prescribed? Yes Patient should review meds with caller to confirm 3. Have there been any falls? No 4. Has Home Health been to the house and/or have they contacted you? Yes If not, have you tried to contact them? Can we help you contact them? 5. Are bowels and bladder emptying properly? Yes Are there any unexpected incontinence issues? No If applicable, is patient following bowel/bladder programs? 6. Any fevers, problems with breathing, unexpected pain? No 7. Are there any skin problems or new areas of breakdown? No 8. Has the patient/family member arranged specialty MD follow up (ie cardiology/neurology/renal/surgical/etc)? Yes  Can we help arrange? 9. Does the patient need any other services or support that we can help arrange? No 10. Are caregivers following through as expected in assisting the patient? Yes 11. Has the patient quit smoking, drinking alcohol, or using drugs as recommended? Yes  Appointment time 1:40pm arrive time 1:20pm with Dr. Dagoberto Ligas on 07/23/2019 [place] [address]

## 2019-07-17 ENCOUNTER — Emergency Department (HOSPITAL_COMMUNITY): Payer: Medicare Other

## 2019-07-17 ENCOUNTER — Emergency Department (HOSPITAL_COMMUNITY)
Admission: EM | Admit: 2019-07-17 | Discharge: 2019-07-17 | Disposition: A | Discharge status: Home / Self Care | Payer: Medicare Other | Attending: Emergency Medicine | Admitting: Emergency Medicine

## 2019-07-17 ENCOUNTER — Encounter (HOSPITAL_COMMUNITY): Payer: Self-pay | Admitting: Primary Care

## 2019-07-17 DIAGNOSIS — Z20828 Contact with and (suspected) exposure to other viral communicable diseases: Secondary | ICD-10-CM | POA: Diagnosis not present

## 2019-07-17 DIAGNOSIS — I639 Cerebral infarction, unspecified: Secondary | ICD-10-CM | POA: Diagnosis not present

## 2019-07-17 DIAGNOSIS — R29810 Facial weakness: Secondary | ICD-10-CM | POA: Diagnosis not present

## 2019-07-17 DIAGNOSIS — I10 Essential (primary) hypertension: Secondary | ICD-10-CM | POA: Insufficient documentation

## 2019-07-17 DIAGNOSIS — E119 Type 2 diabetes mellitus without complications: Secondary | ICD-10-CM | POA: Insufficient documentation

## 2019-07-17 DIAGNOSIS — R531 Weakness: Secondary | ICD-10-CM | POA: Diagnosis not present

## 2019-07-17 DIAGNOSIS — I6200 Nontraumatic subdural hemorrhage, unspecified: Secondary | ICD-10-CM | POA: Diagnosis not present

## 2019-07-17 DIAGNOSIS — R4781 Slurred speech: Secondary | ICD-10-CM | POA: Diagnosis not present

## 2019-07-17 LAB — CBG MONITORING, ED: Glucose-Capillary: 183 mg/dL — ABNORMAL HIGH (ref 70–99)

## 2019-07-17 LAB — CBC
HCT: 48 % — ABNORMAL HIGH (ref 36.0–46.0)
Hemoglobin: 15.7 g/dL — ABNORMAL HIGH (ref 12.0–15.0)
MCH: 32.2 pg (ref 26.0–34.0)
MCHC: 32.7 g/dL (ref 30.0–36.0)
MCV: 98.6 fL (ref 80.0–100.0)
Platelets: 264 10*3/uL (ref 150–400)
RBC: 4.87 MIL/uL (ref 3.87–5.11)
RDW: 11.9 % (ref 11.5–15.5)
WBC: 9 10*3/uL (ref 4.0–10.5)
nRBC: 0 % (ref 0.0–0.2)

## 2019-07-17 LAB — I-STAT CHEM 8, ED
BUN: 23 mg/dL (ref 8–23)
Calcium, Ion: 1.1 mmol/L — ABNORMAL LOW (ref 1.15–1.40)
Chloride: 105 mmol/L (ref 98–111)
Creatinine, Ser: 0.7 mg/dL (ref 0.44–1.00)
Glucose, Bld: 185 mg/dL — ABNORMAL HIGH (ref 70–99)
HCT: 46 % (ref 36.0–46.0)
Hemoglobin: 15.6 g/dL — ABNORMAL HIGH (ref 12.0–15.0)
Potassium: 4.1 mmol/L (ref 3.5–5.1)
Sodium: 138 mmol/L (ref 135–145)
TCO2: 23 mmol/L (ref 22–32)

## 2019-07-17 LAB — COMPREHENSIVE METABOLIC PANEL
ALT: 20 U/L (ref 0–44)
AST: 20 U/L (ref 15–41)
Albumin: 3.6 g/dL (ref 3.5–5.0)
Alkaline Phosphatase: 66 U/L (ref 38–126)
Anion gap: 13 (ref 5–15)
BUN: 20 mg/dL (ref 8–23)
CO2: 22 mmol/L (ref 22–32)
Calcium: 9.7 mg/dL (ref 8.9–10.3)
Chloride: 103 mmol/L (ref 98–111)
Creatinine, Ser: 0.8 mg/dL (ref 0.44–1.00)
GFR calc Af Amer: >60 mL/min (ref >60)
GFR calc non Af Amer: >60 mL/min (ref >60)
Glucose, Bld: 192 mg/dL — ABNORMAL HIGH (ref 70–99)
Potassium: 4.1 mmol/L (ref 3.5–5.1)
Sodium: 138 mmol/L (ref 135–145)
Total Bilirubin: 0.9 mg/dL (ref 0.3–1.2)
Total Protein: 6.5 g/dL (ref 6.5–8.1)

## 2019-07-17 LAB — DIFFERENTIAL
Abs Immature Granulocytes: 0.02 10*3/uL (ref 0.00–0.07)
Basophils Absolute: 0.1 10*3/uL (ref 0.0–0.1)
Basophils Relative: 1 %
Eosinophils Absolute: 0.3 10*3/uL (ref 0.0–0.5)
Eosinophils Relative: 4 %
Immature Granulocytes: 0 %
Lymphocytes Relative: 18 %
Lymphs Abs: 1.6 10*3/uL (ref 0.7–4.0)
Monocytes Absolute: 0.8 10*3/uL (ref 0.1–1.0)
Monocytes Relative: 8 %
Neutro Abs: 6.3 10*3/uL (ref 1.7–7.7)
Neutrophils Relative %: 69 %

## 2019-07-17 LAB — SARS CORONAVIRUS 2 (TAT 6-24 HRS): SARS Coronavirus 2: NEGATIVE

## 2019-07-17 LAB — APTT: aPTT: 35 seconds (ref 24–36)

## 2019-07-17 LAB — PROTIME-INR
INR: 1.2 (ref 0.8–1.2)
Prothrombin Time: 15 seconds (ref 11.4–15.2)

## 2019-07-17 MED ORDER — SODIUM CHLORIDE 0.9% FLUSH
3.0000 mL | Freq: Once | INTRAVENOUS | Status: AC
  Administered 2019-07-17: 15:00:00 3 mL via INTRAVENOUS

## 2019-07-17 MED ORDER — VALPROATE SODIUM 500 MG/5ML IV SOLN
1100.0000 mg | Freq: Once | INTRAVENOUS | Status: AC
  Administered 2019-07-17: 1100 mg via INTRAVENOUS
  Filled 2019-07-17: qty 11

## 2019-07-17 MED ORDER — VALPROIC ACID 250 MG PO CAPS: 250.0000 mg | ORAL_CAPSULE | Freq: Three times a day (TID) | ORAL | 0 refills | Status: DC

## 2019-07-17 NOTE — ED Triage Notes (Signed)
Patient arrived via [facility]. Patient reports feeling pain and family gave her something for it. Per EMS pt had slurred speech, right sided facial droop. Pt was in the hospital last week for a stroke and was discharged last Thursday and is on Eliquis. Per EMS pt hit head on bed, but there was no fall.

## 2019-07-17 NOTE — Discharge Instructions (Signed)
You were seen in the emergency department today with a stroke.  Unfortunately, there is no further medical therapy that will assist you at this time.  We have made a change to your seizure medication.  Please follow-up with your neurologist and begin taking the Depakote as prescribed.  You may stop taking your other seizure medication.  Return to the emergency department any new or suddenly worsening symptoms.

## 2019-07-17 NOTE — ED Notes (Signed)
[facility] called to cancel code stroke per Dr Cheral Marker

## 2019-07-17 NOTE — Consult Note (Addendum)
NEURO HOSPITALIST  CONSULT   Requesting Physician: Dr. Laverta Baltimore    Chief Complaint:  Slurred speech and right facial droop  History obtained from:  Patient / Chart    HPI:                                                                                                                                        [name] is an 85 y.o. female with PMH CVA( 06/2019), HTN, HLD, RA and atrial fibrillation who presented to [facility] ED as a code stroke with c/o slurred speech and right facial droop.  Today patient family noticed that she was having some slurred speech and a right facial droop. She did not seem quite right.  EMS said the facial droop had resolved by the time they arrived. Patient did not fall on Thursday but she did hit her head on the metal bed railing. Patient was recently discharged from [facility] after a stroke. She is on eliquis daily and Keppra 250 mg BID.  Patient had 2 seizure like episodes after her stroke on 06/26/2019 and was started on keppra. Denies any unilateral weakness, smoking, ETOH use. Patient was given a Xanax this afternoon for her nerves which has resulted in some sedation.   ED course:  CTH: no hemorrhage BP: 138/78 BG: 216  MRI Brain 06/26/2019:     Date last known well: 07/17/19 Time last known well: 1000 tPA Given: no; on eliquis Modified Rankin: Rankin Score=1 NIHSS:4      Past Medical History:  Diagnosis Date  . Acute osteomyelitis ([place]) 08/25/2018   Right toe  . Anemia   . Atrial fibrillation ([place])   . Carpal tunnel syndrome, bilateral   . Cellulitis 08/25/2018   Right foot  . Cervical lymphadenopathy   . Compression fracture of fifth lumbar vertebra (H[facility])    to T 11  . DDD (degenerative disc disease), lumbar   . DJD (degenerative joint disease)   . DOE (dyspnea on exertion)   . Dysphagia   . Dyspnea    with exertion  . Dysrhythmia    hx brief AF post op hip surg  . Esophageal dysmotility 01/31/2014    noted barium  . GERD (gastroesophageal reflux disease)   . Hematoma    right side after stent placement  . History of hiatal hernia 01/31/2014   Small, noted on Barium  . Hyperlipidemia   . Hypertension   . Jaundice   . Low back pain   . MRSA (methicillin resistant Staphylococcus aureus)   . Neuropathy   . Nocturia   . OA (osteoarthritis)   . Osteoporosis   .  Ovarian cyst   . PAD (peripheral artery disease) ([place])   . Peripheral vascular disease ([place])   . Pleural effusion, left 04/17/2017   Small  . RA (rheumatoid arthritis) ([place])   . Spinal stenosis   . Toe ulcer, right, with necrosis of bone ([place]) 08/25/2018  . Type 2 diabetes mellitus ([place])   . Unsteady gait   . Urinary incontinence   . Uterine polyp   . Varicose vein of leg    Bilateral    Past Surgical History:  Procedure Laterality Date  . AMPUTATION Right 02/04/2013   Procedure: [name] ;  Surgeon: Wylene Simmer, MD;  Location: [place];  Service: Orthopedics;  Laterality: Right;  . AMPUTATION TOE Right 09/03/2018   Procedure: AMPUTATION 3RD RIGHT TOE INTERPHALANGEAL;  Surgeon: [name], DPM;  Location: [facility];  Service: Podiatry;  Laterality: Right;  . APPENDECTOMY    . COLONOSCOPY    . EYE SURGERY     both cataracts  . FEMORAL ARTERY EXPLORATION Right 04/12/2017   Procedure: EVACUATION HEMATOMA RIGHT GROIN WITH FEMORAL ARTERY REPAIR;  Surgeon: Rosetta Posner, MD;  Location: [place];  Service: Vascular;  Laterality: Right;  . INCISION AND DRAINAGE  2011   left hip inf-hemovac  . IR ANGIOGRAM EXTREMITY LEFT  03/25/2017  . IR ANGIOGRAM EXTREMITY LEFT  04/08/2017  . IR ANGIOGRAM SELECTIVE EACH ADDITIONAL VESSEL  03/25/2017  . IR FEM POP ART STENT INC PTA MOD SED  03/25/2017  . IR INFUSION THROMBOL ARTERIAL INITIAL (MS)  04/08/2017  . IR RADIOLOGIST EVAL & MGMT  02/26/2017  . IR RADIOLOGIST EVAL & MGMT  04/03/2017  . IR RADIOLOGIST EVAL & MGMT  08/20/2017  . IR RADIOLOGIST EVAL & MGMT   01/22/2018  . IR RADIOLOGIST EVAL & MGMT  01/13/2019  . IR THROMB F/U EVAL ART/VEN FINAL DAY (MS)  04/09/2017  . IR TIB-PERO ART PTA MOD SED  03/25/2017  . IR US GUIDE VASC ACCESS RIGHT  03/25/2017  . IR US GUIDE VASC ACCESS RIGHT  04/08/2017  . KNEE ARTHROSCOPY  1995   left  . NECK MASS EXCISION    . TONSILLECTOMY    . TOTAL HIP ARTHROPLASTY  2006   left-fx  . TOTAL KNEE ARTHROPLASTY  [id number]   left  . TOTAL KNEE ARTHROPLASTY  2009   right    Family History  Problem Relation Age of Onset  . Cancer Mother    Social History:  reports that she has never smoked. She has never used smokeless tobacco. She reports previous alcohol use. She reports that she does not use drugs.  Allergies:  Allergies  Allergen Reactions  . Amprenavir Other (See Comments)    Unknown reaction  . Bactrim [Sulfamethoxazole-Trimethoprim] Nausea Only  . Ceftriaxone Sodium Other (See Comments)    Tingly feeling after given, then one hour later while vanco infusing pruritic rash  . Phenergan [Promethazine Hcl] Other (See Comments)    Hallucinations  . Sulfonamide Derivatives Hives  . Vancomycin Hcl Rash    Rash happened 1hr after rocephin but immediately after infusing vancomycin    Medications:  Current Facility-Administered Medications  Medication Dose Route Frequency Provider Last Rate Last Dose  . sodium chloride flush (NS) 0.9 % injection 3 mL  3 mL Intravenous Once Long, Wonda Olds, MD       Current Outpatient Medications  Medication Sig Dispense Refill  . acetaminophen (TYLENOL) 325 MG tablet Take 650 mg by mouth daily with breakfast.     . ALPRAZolam (XANAX) 0.25 MG tablet Take 1 tablet (0.25 mg total) by mouth every 6 (six) hours as needed for anxiety. 20 tablet 0  . amLODipine (NORVASC) 10 MG tablet Take 1 tablet (10 mg total) by mouth daily. 30 tablet 0  . apixaban (ELIQUIS) 2.5  MG TABS tablet Take 1 tablet (2.5 mg total) by mouth 2 (two) times daily. 60 tablet 0  . aspirin EC 81 MG EC tablet Take 1 tablet (81 mg total) by mouth daily.    Marland Kitchen atorvastatin (LIPITOR) 40 MG tablet Take 1 tablet (40 mg total) by mouth daily at 6 PM. 30 tablet 0  . calcium carbonate (OSCAL) 1500 (600 Ca) MG TABS tablet Take 600 mg of elemental calcium by mouth daily with breakfast.     . ergocalciferol (VITAMIN D2) 1.25 MG (50000 UT) capsule Take 50,000 Units by mouth every Sunday.     . ferrous sulfate 325 (65 FE) MG tablet Take 325 mg by mouth daily with breakfast.    . levETIRAcetam (KEPPRA) 250 MG tablet Take 1 tablet (250 mg total) by mouth 2 (two) times daily. 60 tablet 0  . metFORMIN (GLUCOPHAGE) 500 MG tablet Take 1 tablet (500 mg total) by mouth 2 (two) times daily with a meal. 60 tablet 0  . metoprolol succinate (TOPROL-XL) 25 MG 24 hr tablet Take 1 tablet (25 mg total) by mouth daily with supper. 30 tablet 0  . pantoprazole (PROTONIX) 40 MG tablet Take 1 tablet (40 mg total) by mouth daily. 30 tablet 0  . vitamin B-12 (CYANOCOBALAMIN) 1000 MCG tablet Take 1,000 mcg by mouth daily with breakfast.        ROS:                                                                                                                             ROS was Performed and is negative except as noted in HPI    General Examination:                                                                                                       BP 123/73   Pulse  66   Temp 97.9 F (36.6 C) (Oral)   Resp 11   Ht 5' (1.524 m)   Wt 55.3 kg   SpO2 100%   BMI 23.81 kg/m    HEENT-  Normocephalic, no lesions, without obvious abnormality.  Normal external eye and conjunctiva. Cardiovascular- S1-S2 audible, pulses palpable throughout  Lungs-no rhonchi or wheezing noted, no excessive working breathing.  Saturations within normal limits Abdomen- All 4 quadrants palpated and non tender Extremities- Warm, dry and  intact Musculoskeletal-no joint tenderness, deformity or swelling Skin-warm and dry, no hyperpigmentation, vitiligo, or suspicious lesions  Neurological Examination Mental Status: Drowsy (sedated looking).  Makes little eye contact. [place]. In this context, limited speech output is fluent without evidence of aphasia.  Able to follow most simple commands, but there is some difficulty following specific lower extremity motor commands. Some deficits with naming. Speech is hypophonic and relatively sparse. Overall appears to have a cognitive deficit.  Cranial Nerves: II: Visual fields grossly normal,  III,IV, VI: ptosis not present, saccadic pursuits of EOEM, pupils equal, round, reactive to light and accommodation V,VII: smile symmetric, facial light touch sensation normal bilaterally VIII: hearing normal bilaterally IX,X: uvula rises midline XI: bilateral shoulder shrug XII: midline tongue extension; tongue is dry Motor: 4+/5 in all extremities Increased tone in RLE Sensory:  light touch intact throughout, bilaterally. Temp sensation intact x 4. Difficult to ascertain if there is asymmetry as patient has cognitive deficit.  Deep Tendon Reflexes: 2+ BUE RLE  No knee jerk, trace in LLE Cerebellar: No ataxia noted with FNF, but performs this slowly.  Gait: deferred   Lab Results: Basic Metabolic Panel: Recent Labs  Lab 07/17/19 1459 07/17/19 1507  NA 138 138  K 4.1 4.1  CL 103 105  CO2 22  --   GLUCOSE 192* 185*  BUN 20 23  CREATININE 0.80 0.70  CALCIUM 9.7  --     CBC: Recent Labs  Lab 07/17/19 1459 07/17/19 1507  WBC 9.0  --   NEUTROABS 6.3  --   HGB 15.7* 15.6*  HCT 48.0* 46.0  MCV 98.6  --   PLT 264  --     CBG: Recent Labs  Lab 07/12/19 1206 07/12/19 1642 07/12/19 2115 07/13/19 0615 07/17/19 1457  GLUCAP 129* 148* 150* 143* 183*    Imaging: Ct Head Code Stroke Wo Contrast  Result Date: 07/17/2019 CLINICAL DATA:  Code stroke. 85 year old female last  seen normal at 10 100 hours. Facial droop. EXAM: CT HEAD WITHOUT CONTRAST TECHNIQUE: Contiguous axial images were obtained from the base of the skull through the vertex without intravenous contrast. COMPARISON:  Brain MRI and intracranial MRA 06/27/2019. Head CT 06/26/2019. FINDINGS: Brain: The small right occipital cortical areas of infarct seen by MRI on 06/26/2019 remain occult by CT. Although there is progressive periatrial white matter hypodensity on series 3, image 19. Elsewhere stable gray-white matter differentiation throughout the brain. No cortically based acute infarct identified. No midline shift, mass effect, or evidence of intracranial mass lesion. No acute intracranial hemorrhage identified. No ventriculomegaly. Vascular: Extensive Calcified atherosclerosis at the skull base. No suspicious intracranial vascular hyperdensity. Skull: Partially visible degenerative changes at C1-C2. No acute osseous abnormality identified. Sinuses/Orbits: Stable right frontal sinus mucosal thickening. Other paranasal sinuses and mastoids are clear. Other: Broad-based right forehead scalp soft tissue hematoma is new and measures 5 millimeters in thickness. Underlying right frontal bone appears stable and intact. Stable, negative orbits. ASPECTS [facility] Stroke Program Early CT Score) Total score (0-10  with 10 being normal): 10. IMPRESSION: 1. No acute cortically based infarct or acute intracranial hemorrhage identified. But occipital lobe white matter-hypodensity has progressed from 06/26/2019. ASPECTS is 10. 2. The above results were communicated to Dr. Cheral Marker at 3:23 pm on 07/17/2019 by text page via the [facility] messaging system. 3. Also, there is a small right forehead scalp hematoma with no underlying skull fracture. Electronically Signed   By: Genevie Ann M.D.   On: 07/17/2019 15:23    MRI brain from today:  Patchy new restricted diffusion in the right corona radiata (series 5, image 85) and in the periatrial and  regional subcortical white matter (images 81 and 82). Fading abnormal diffusion in the right lateral occipital lobe and occipital pole since 06/26/2019. Questionable involvement in the medial right lentiform on series 5, image 79. Otherwise no deep gray matter involvement.  Laurey Morale, MSN, NP-C [facility] [phone] 07/17/2019, 3:03 PM    Assessment: 85 y.o. female with PMH CVA( 06/2019), HTN, HLD, RA who presented to [facility] ED as a code stroke with c/o slurred speech and right facial droop.  1. CTH: negative for hemorrhage.  2. Exam reveals cognitive communication deficit.  3. MRI brain reveals patchy new restricted diffusion in the right corona radiata and in the periatrial and regional subcortical white matter. Fading abnormal diffusion in the right lateral occipital lobe and occipital pole since 06/26/2019. Questionable involvement in the medial right lentiform nucleus. 4. Stroke Risk Factors - atrial fibrillation, hyperlipidemia and hypertension 5. Stroke mechanism most likely watershed in ACA/MCA territory (images personally reviewed) due to volume depletion in the setting of severe right MCA stenosis seen on MRA from prior stroke workup on 06/26/19    Recommendations: -- IVF -- Due to sedation from [place], will switch to Depakote. Load valproic acid 20 mg/kg x 1 IV, then start 300 mg po TID.  -- Patient is already on maximal medical therapy with Eliquis for stroke prevention in the setting of atrial fibrillation. Her atorvastatin should remain at 40 mg qd as the risk of complications (muscle weakness, rhabdomyolysis) is felt to outweigh benefits given advanced age.  -- Would benefit from IV hydration as her BP of 123/73 in conjunction with dry oral mucosa as well as her BUN:Cr ratio of > 20 most likely reflects volume depletion. After IV fluids in the ED, she should be encouraged to drink 8 cups of water per day, more if necessary due to heat or physical exertion.  --  Family has communicated to the EDP that they would like to be discharged home. She should have outpatient Neurology follow up in 2-4 weeks.    I have seen and examined the patient. I have formulated the assessment and plan. My exam findings were observed and documented by Laurey Morale, Neurohospitalist NP. Electronically signed: Dr. Kerney Elbe

## 2019-07-17 NOTE — ED Provider Notes (Signed)
Emergency Department Provider Note   I have reviewed the triage vital signs and the nursing notes.   HISTORY  Chief Complaint Code Stroke   HPI [name] is a 85 y.o. female last medical history reviewed below presents to the emergency department as a code stroke by EMS.  Last seen normal 10 AM.  Patient developed slurred speech and right side face weakness.  She was admitted last week with stroke symptoms as well.  Facial droop has resolved but continues to have speech disturbance.  No vision change.  No weakness in the arms or legs.  Patient is on Eliquis.  EMS does note some bruising to the right scalp but no report of falls.    Past Medical History:  Diagnosis Date   Acute osteomyelitis ([place]) 08/25/2018   Right toe   Anemia    Atrial fibrillation (HCC)    Carpal tunnel syndrome, bilateral    Cellulitis 08/25/2018   Right foot   Cervical lymphadenopathy    Compression fracture of fifth lumbar vertebra (HCC)    to T 11   CVA (cerebral vascular accident) ([place]) 06/26/2019   DDD (degenerative disc disease), lumbar    DJD (degenerative joint disease)    DOE (dyspnea on exertion)    Dysphagia    Dyspnea    with exertion   Dysrhythmia    hx brief AF post op hip surg   Esophageal dysmotility 01/31/2014   noted barium   GERD (gastroesophageal reflux disease)    Hematoma    right side after stent placement   History of hiatal hernia 01/31/2014   Small, noted on Barium   Hyperlipidemia    Hypertension    Jaundice    Low back pain    MRSA (methicillin resistant Staphylococcus aureus)    Neuropathy    Nocturia    OA (osteoarthritis)    Osteoporosis    Ovarian cyst    PAD (peripheral artery disease) (HCC)    Peripheral vascular disease (HCC)    Pleural effusion, left 04/17/2017   Small   RA (rheumatoid arthritis) ([place])    Spinal stenosis    Toe ulcer, right, with necrosis of bone ([place]) 08/25/2018   Type 2 diabetes mellitus  ([place])    Unsteady gait    Urinary incontinence    Uterine polyp    Varicose vein of leg    Bilateral    Patient Active Problem List   Diagnosis Date Noted   Memory loss    Seizure ([place])    Leukocytosis    Controlled type 2 diabetes mellitus with hyperglycemia, without Deryn Massengale-term current use of insulin (HCC)    PAF (paroxysmal atrial fibrillation) ([place])    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[place]) 06/28/2019   CVA (cerebral vascular accident) ([place]) 06/27/2019   TIA (transient ischemic attack) 06/26/2019   Hyperlipidemia    Type 2 diabetes mellitus ([place])    Degeneration of lumbar intervertebral disc 12/11/2017   Cough    Hematoma    Groin swelling 04/12/2017   Hematoma of groin 04/12/2017   Critical lower limb ischemia 04/08/2017   Katriona Schmierer term (current) use of anticoagulants [Z79.01] 12/13/2015   Closed dislocation of tarsometatarsal joint 10/05/2015   ULCER OF OTHER PART OF FOOT 06/06/2010   Essential hypertension 03/26/2010   ATRIAL FIBRILLATION 03/26/2010   CUTANEOUS ERUPTIONS, DRUG-INDUCED 03/26/2010   DIARRHEA 03/26/2010   GERD 03/20/2010   UNSPEC LOCAL INFECTION SKIN&SUBCUTANEOUS TISSUE 03/20/2010   OSTEOARTHRITIS 03/20/2010   ARTHRITIS, SEPTIC 03/14/2010  OTH COMPLICATIONS DUE INTERNAL JOINT PROSTHESIS 02/22/2010    Past Surgical History:  Procedure Laterality Date   AMPUTATION Right 02/04/2013   Procedure: [name] ;  Surgeon: Wylene Simmer, MD;  Location: [facility];  Service: Orthopedics;  Laterality: Right;   AMPUTATION TOE Right 09/03/2018   Procedure: AMPUTATION 3RD RIGHT TOE INTERPHALANGEAL;  Surgeon: [name], DPM;  Location[facility];  Service: Podiatry;  Laterality: Right;   APPENDECTOMY     COLONOSCOPY     EYE SURGERY     both cataracts   FEMORAL ARTERY EXPLORATION Right 04/12/2017   Procedure: EVACUATION HEMATOMA RIGHT GROIN WITH FEMORAL ARTERY REPAIR;  Surgeon: Rosetta Posner, MD;   Location: [facility];  Service: Vascular;  Laterality: Right;   INCISION AND DRAINAGE  2011   left hip inf-hemovac   IR ANGIOGRAM EXTREMITY LEFT  03/25/2017   IR ANGIOGRAM EXTREMITY LEFT  04/08/2017   IR ANGIOGRAM SELECTIVE EACH ADDITIONAL VESSEL  03/25/2017   IR FEM POP ART STENT INC PTA MOD SED  03/25/2017   IR INFUSION THROMBOL ARTERIAL INITIAL (MS)  04/08/2017   IR RADIOLOGIST EVAL & MGMT  02/26/2017   IR RADIOLOGIST EVAL & MGMT  04/03/2017   IR RADIOLOGIST EVAL & MGMT  08/20/2017   IR RADIOLOGIST EVAL & MGMT  01/22/2018   IR RADIOLOGIST EVAL & MGMT  01/13/2019   IR THROMB F/U EVAL ART/VEN FINAL DAY (MS)  04/09/2017   IR TIB-PERO ART PTA MOD SED  03/25/2017   IR US GUIDE VASC ACCESS RIGHT  03/25/2017   IR US GUIDE VASC ACCESS RIGHT  04/08/2017   KNEE ARTHROSCOPY  1995   left   NECK MASS EXCISION     TONSILLECTOMY     TOTAL HIP ARTHROPLASTY  2006   left-fx   TOTAL KNEE ARTHROPLASTY  2010,2012   left   TOTAL KNEE ARTHROPLASTY  2009   right    Allergies Amprenavir, Bactrim [sulfamethoxazole-trimethoprim], Ceftriaxone sodium, Phenergan [promethazine hcl], Sulfonamide derivatives, and Vancomycin hcl  Family History  Problem Relation Age of Onset   Cancer Mother     Social History Social History   Tobacco Use   Smoking status: Never Smoker   Smokeless tobacco: Never Used  Substance Use Topics   Alcohol use: Not Currently   Drug use: No    Review of Systems  Constitutional: No fever/chills Eyes: No visual changes. ENT: No sore throat. Cardiovascular: Denies chest pain. Respiratory: Denies shortness of breath. Gastrointestinal: No abdominal pain.  No nausea, no vomiting.  No diarrhea.  No constipation. Genitourinary: Negative for dysuria. Musculoskeletal: Negative for back pain. Skin: Negative for rash. Neurological: Negative for headaches. Positive slurred speech and right face droop.   10-point ROS otherwise  negative.  ____________________________________________   PHYSICAL EXAM:  VITAL SIGNS: ED Triage Vitals [07/17/19 1522]  Enc Vitals Group     BP 137/72     Pulse Rate 67     Resp 20     Temp 97.9 F (36.6 C)     Temp Source Oral     SpO2 98 %   Constitutional: Alert and oriented. No acute distress.  Eyes: Conjunctivae are normal.  Head: Atraumatic. Nose: No congestion/rhinnorhea. Mouth/Throat: Mucous membranes are moist.  Neck: No stridor.  Cardiovascular: Normal rate, regular rhythm. Good peripheral circulation. Grossly normal heart sounds.   Respiratory: Normal respiratory effort.  No retractions. Lungs CTAB. Gastrointestinal: No distention.  Musculoskeletal: No gross deformities of extremities. Neurologic: Patient with hesitant  speech with mild dysarthria. Normal strength and sensation in the upper and lower extremities.  Skin:  Skin is warm, dry and intact. No rash noted.  ____________________________________________   LABS (all labs ordered are listed, but only abnormal results are displayed)  Labs Reviewed  CBC - Abnormal; Notable for the following components:      Result Value   Hemoglobin 15.7 (*)    HCT 48.0 (*)    All other components within normal limits  COMPREHENSIVE METABOLIC PANEL - Abnormal; Notable for the following components:   Glucose, Bld 192 (*)    All other components within normal limits  I-STAT CHEM 8, ED - Abnormal; Notable for the following components:   Glucose, Bld 185 (*)    Calcium, Ion 1.10 (*)    Hemoglobin 15.6 (*)    All other components within normal limits  CBG MONITORING, ED - Abnormal; Notable for the following components:   Glucose-Capillary 183 (*)    All other components within normal limits  SARS CORONAVIRUS 2 (TAT 6-12 HRS)  PROTIME-INR  APTT  DIFFERENTIAL   ____________________________________________  EKG   EKG Interpretation  Date/Time:  Saturday July 17 2019 15:28:14 EDT Ventricular Rate:  77 PR  Interval:    QRS Duration: 101 QT Interval:  427 QTC Calculation: 484 R Axis:   -14 Text Interpretation:  Atrial fibrillation Incomplete RBBB and LAFB Probable left ventricular hypertrophy Anterior Q waves, possibly due to LVH No STEMI  Confirmed by Nanda Quinton [phone]) on 07/17/2019 3:30:31 PM Also confirmed by Nanda Quinton [phone]), editor Jillene Bucks [phone])  on 07/18/2019 8:43:50 AM       ____________________________________________  RADIOLOGY  Mr Brain Wo Contrast  Result Date: 07/17/2019 CLINICAL DATA:  85 year old female with facial droop, last seen normal at 10 100 hours. Patchy right PCA territory ischemia earlier this month. EXAM: MRI HEAD WITHOUT CONTRAST TECHNIQUE: Multiplanar, multiecho pulse sequences of the brain and surrounding structures were obtained without intravenous contrast. COMPARISON:  Brain MRI 06/27/2019. FINDINGS: Brain: Patchy new restricted diffusion in the right corona radiata (series 5, image 85) and in the periatrial and regional subcortical white matter (images 81 and 82). Fading abnormal diffusion in the right lateral occipital lobe and occipital pole since 06/26/2019. Questionable involvement in the medial right lentiform on series 5, image 79. Otherwise no deep gray matter involvement. No left hemisphere, or posterior fossa restricted diffusion. Associated increased T2 and FLAIR hyperintensity in the affected areas. No mass effect. No acute hemorrhage. Underlying chronic microhemorrhages elsewhere in the brain appears stable. Stable gray and white matter signal elsewhere. No midline shift, mass effect, evidence of mass lesion, ventriculomegaly, extra-axial collection or acute intracranial hemorrhage. Cervicomedullary junction and pituitary are within normal limits. Vascular: Major intracranial vascular flow voids are stable. Skull and upper cervical spine: Multilevel cervical spine ankylosis redemonstrated. Normal bone marrow signal. Sinuses/Orbits: Stable,  negative. Other: Mastoids remain clear. Visible internal auditory structures appear normal. Negative scalp and face soft tissues. IMPRESSION: 1. Patchy progressed ischemia in the right hemisphere since 06/27/2019 is in the right MCA and right MCA/PCA watershed area. No associated hemorrhage or mass effect. 2. Expected evolution of the small right PCA territory infarcts. Stable noncontrast MRI appearance of the brain elsewhere. Electronically Signed   By: Genevie Ann M.D.   On: 07/17/2019 18:21   Ct Head Code Stroke Wo Contrast  Result Date: 07/17/2019 CLINICAL DATA:  Code stroke. 85 year old female last seen normal at 10 100 hours. Facial droop. EXAM: CT HEAD WITHOUT CONTRAST  TECHNIQUE: Contiguous axial images were obtained from the base of the skull through the vertex without intravenous contrast. COMPARISON:  Brain MRI and intracranial MRA 06/27/2019. Head CT 06/26/2019. FINDINGS: Brain: The small right occipital cortical areas of infarct seen by MRI on 06/26/2019 remain occult by CT. Although there is progressive periatrial white matter hypodensity on series 3, image 19. Elsewhere stable gray-white matter differentiation throughout the brain. No cortically based acute infarct identified. No midline shift, mass effect, or evidence of intracranial mass lesion. No acute intracranial hemorrhage identified. No ventriculomegaly. Vascular: Extensive Calcified atherosclerosis at the skull base. No suspicious intracranial vascular hyperdensity. Skull: Partially visible degenerative changes at C1-C2. No acute osseous abnormality identified. Sinuses/Orbits: Stable right frontal sinus mucosal thickening. Other paranasal sinuses and mastoids are clear. Other: Broad-based right forehead scalp soft tissue hematoma is new and measures 5 millimeters in thickness. Underlying right frontal bone appears stable and intact. Stable, negative orbits. ASPECTS [facility] Stroke Program Early CT Score) Total score (0-10 with 10 being  normal): 10. IMPRESSION: 1. No acute cortically based infarct or acute intracranial hemorrhage identified. But occipital lobe white matter-hypodensity has progressed from 06/26/2019. ASPECTS is 10. 2. The above results were communicated to Dr. Cheral Marker at 3:23 pm on 07/17/2019 by text page via the [facility] messaging system. 3. Also, there is a small right forehead scalp hematoma with no underlying skull fracture. Electronically Signed   By: Genevie Ann M.D.   On: 07/17/2019 15:23    ____________________________________________   PROCEDURES  Procedure(s) performed:   Procedures  CRITICAL CARE Performed by: Margette Fast Total critical care time: 35 minutes Critical care time was exclusive of separately billable procedures and treating other patients. Critical care was necessary to treat or prevent imminent or life-threatening deterioration. Critical care was time spent personally by me on the following activities: development of treatment plan with patient and/or surrogate as well as nursing, discussions with consultants, evaluation of patient's response to treatment, examination of patient, obtaining history from patient or surrogate, ordering and performing treatments and interventions, ordering and review of laboratory studies, ordering and review of radiographic studies, pulse oximetry and re-evaluation of patient's condition.  Nanda Quinton, MD Emergency Medicine  ____________________________________________   INITIAL IMPRESSION / ASSESSMENT AND PLAN / ED COURSE  Pertinent labs & imaging results that were available during my care of the patient were reviewed by me and considered in my medical decision making (see chart for details).   Patient arrives as a code stroke with acute onset speech change and right face droop.  Facial droop has resolved by the time she has arrived in the emergency department.  I evaluated the patient on arrival and cleared for CT.  Neurology team has followed along  with the patient and canceled the code stroke as she is outside of tPA window with no concern for LVO. Not requiring acute BP mgmt.   CT, labs, and MRI reviewed. Discussed with Dr. Cheral Marker and family. Patient with evidence of new ischemia but medical therapy is at maximum. No benefit for admit. Discussed switching seizure med. Ordered per Neuro rec. Patient loaded here and discharged. Discussed ED return precautions.  ____________________________________________  FINAL CLINICAL IMPRESSION(S) / ED DIAGNOSES  Final diagnoses:  Acute ischemic stroke [facility])     MEDICATIONS GIVEN DURING THIS VISIT:  Medications  sodium chloride flush (NS) 0.9 % injection 3 mL (3 mLs Intravenous Given 07/17/19 1524)  valproate (DEPACON) 1,100 mg in dextrose 5 % 50 mL IVPB (0 mg Intravenous Stopped 07/17/19  2146)     NEW OUTPATIENT MEDICATIONS STARTED DURING THIS VISIT:  Discharge Medication List as of 07/17/2019  9:52 PM    START taking these medications   Details  valproic acid (DEPAKENE) 250 MG capsule Take 1 capsule (250 mg total) by mouth 3 (three) times daily., Starting Sat 07/17/2019, Until Mon 08/16/2019, Print        Note:  This document was prepared using Dragon voice recognition software and may include unintentional dictation errors.  Nanda Quinton, MD Emergency Medicine    Ohanna Gassert, [facility], MD 07/18/19 [phone]

## 2019-07-18 ENCOUNTER — Telehealth: Payer: Self-pay | Admitting: *Deleted

## 2019-07-18 NOTE — Telephone Encounter (Signed)
TOC CM spoke to pt's son, Averly Mogel as instructed in referral. Son states pt has [facility] for HHPT and has 24 hour caregivers in the home. Explained PCP will assist with getting hospital bed for home. ED visit no hospital bed order entered. Son states he will follow up with PCP's office for assistance with hospital bed. [place], [place] ED TOC CM [phone]

## 2019-07-19 DIAGNOSIS — E1165 Type 2 diabetes mellitus with hyperglycemia: Secondary | ICD-10-CM | POA: Diagnosis not present

## 2019-07-19 DIAGNOSIS — I10 Essential (primary) hypertension: Secondary | ICD-10-CM | POA: Diagnosis not present

## 2019-07-19 DIAGNOSIS — I69354 Hemiplegia and hemiparesis following cerebral infarction affecting left non-dominant side: Secondary | ICD-10-CM | POA: Diagnosis not present

## 2019-07-19 DIAGNOSIS — E1151 Type 2 diabetes mellitus with diabetic peripheral angiopathy without gangrene: Secondary | ICD-10-CM | POA: Diagnosis not present

## 2019-07-19 DIAGNOSIS — I69328 Other speech and language deficits following cerebral infarction: Secondary | ICD-10-CM | POA: Diagnosis not present

## 2019-07-19 DIAGNOSIS — I480 Paroxysmal atrial fibrillation: Secondary | ICD-10-CM | POA: Diagnosis not present

## 2019-07-20 ENCOUNTER — Telehealth: Payer: Self-pay

## 2019-07-20 DIAGNOSIS — E1165 Type 2 diabetes mellitus with hyperglycemia: Secondary | ICD-10-CM | POA: Diagnosis not present

## 2019-07-20 DIAGNOSIS — I69328 Other speech and language deficits following cerebral infarction: Secondary | ICD-10-CM | POA: Diagnosis not present

## 2019-07-20 DIAGNOSIS — E1151 Type 2 diabetes mellitus with diabetic peripheral angiopathy without gangrene: Secondary | ICD-10-CM | POA: Diagnosis not present

## 2019-07-20 DIAGNOSIS — I10 Essential (primary) hypertension: Secondary | ICD-10-CM | POA: Diagnosis not present

## 2019-07-20 DIAGNOSIS — I69354 Hemiplegia and hemiparesis following cerebral infarction affecting left non-dominant side: Secondary | ICD-10-CM | POA: Diagnosis not present

## 2019-07-20 DIAGNOSIS — I480 Paroxysmal atrial fibrillation: Secondary | ICD-10-CM | POA: Diagnosis not present

## 2019-07-20 NOTE — Telephone Encounter (Signed)
Brandon OT [facility] called requesting verbal orders for 2xwk X 3wks followed by 1xwk X 1wk.  Called him back and approved verbal orders.

## 2019-07-21 ENCOUNTER — Telehealth: Payer: Self-pay

## 2019-07-21 DIAGNOSIS — E1165 Type 2 diabetes mellitus with hyperglycemia: Secondary | ICD-10-CM | POA: Diagnosis not present

## 2019-07-21 DIAGNOSIS — I10 Essential (primary) hypertension: Secondary | ICD-10-CM | POA: Diagnosis not present

## 2019-07-21 DIAGNOSIS — I69354 Hemiplegia and hemiparesis following cerebral infarction affecting left non-dominant side: Secondary | ICD-10-CM | POA: Diagnosis not present

## 2019-07-21 DIAGNOSIS — I69328 Other speech and language deficits following cerebral infarction: Secondary | ICD-10-CM | POA: Diagnosis not present

## 2019-07-21 DIAGNOSIS — E1151 Type 2 diabetes mellitus with diabetic peripheral angiopathy without gangrene: Secondary | ICD-10-CM | POA: Diagnosis not present

## 2019-07-21 DIAGNOSIS — I480 Paroxysmal atrial fibrillation: Secondary | ICD-10-CM | POA: Diagnosis not present

## 2019-07-21 NOTE — Telephone Encounter (Signed)
Alonna Minium ST [facility] called requesting verbal orders for ST for 2xwk X 3wks starting on 08-09-2019.  Called her back and approved verbal orders

## 2019-07-21 NOTE — Telephone Encounter (Signed)
[name] PT [facility] called as well requesting verbal orders for this patient for 1xwk X 1wk followed by 2xwk X 4wks followed by 1xwk X 4wks.  Called him back and approved verbal orders.

## 2019-07-22 DIAGNOSIS — I69328 Other speech and language deficits following cerebral infarction: Secondary | ICD-10-CM | POA: Diagnosis not present

## 2019-07-22 DIAGNOSIS — I69354 Hemiplegia and hemiparesis following cerebral infarction affecting left non-dominant side: Secondary | ICD-10-CM | POA: Diagnosis not present

## 2019-07-22 DIAGNOSIS — E1165 Type 2 diabetes mellitus with hyperglycemia: Secondary | ICD-10-CM | POA: Diagnosis not present

## 2019-07-22 DIAGNOSIS — E1151 Type 2 diabetes mellitus with diabetic peripheral angiopathy without gangrene: Secondary | ICD-10-CM | POA: Diagnosis not present

## 2019-07-22 DIAGNOSIS — I10 Essential (primary) hypertension: Secondary | ICD-10-CM | POA: Diagnosis not present

## 2019-07-22 DIAGNOSIS — I480 Paroxysmal atrial fibrillation: Secondary | ICD-10-CM | POA: Diagnosis not present

## 2019-07-23 ENCOUNTER — Encounter: Payer: Self-pay | Admitting: Physical Medicine and Rehabilitation

## 2019-07-23 ENCOUNTER — Other Ambulatory Visit: Payer: Self-pay

## 2019-07-23 ENCOUNTER — Encounter
Payer: Medicare Other | Attending: Physical Medicine and Rehabilitation | Admitting: Physical Medicine and Rehabilitation

## 2019-07-23 VITALS — BP 120/76 | HR 82 | Temp 98.0°F | Ht 63.0 in | Wt 110.0 lb

## 2019-07-23 DIAGNOSIS — E785 Hyperlipidemia, unspecified: Secondary | ICD-10-CM | POA: Diagnosis not present

## 2019-07-23 DIAGNOSIS — I63431 Cerebral infarction due to embolism of right posterior cerebral artery: Secondary | ICD-10-CM | POA: Diagnosis not present

## 2019-07-23 DIAGNOSIS — I6359 Cerebral infarction due to unspecified occlusion or stenosis of other cerebral artery: Secondary | ICD-10-CM | POA: Diagnosis not present

## 2019-07-23 DIAGNOSIS — I10 Essential (primary) hypertension: Secondary | ICD-10-CM | POA: Diagnosis not present

## 2019-07-23 DIAGNOSIS — I739 Peripheral vascular disease, unspecified: Secondary | ICD-10-CM | POA: Diagnosis not present

## 2019-07-23 DIAGNOSIS — R569 Unspecified convulsions: Secondary | ICD-10-CM | POA: Diagnosis not present

## 2019-07-23 DIAGNOSIS — E1151 Type 2 diabetes mellitus with diabetic peripheral angiopathy without gangrene: Secondary | ICD-10-CM | POA: Diagnosis not present

## 2019-07-23 DIAGNOSIS — R269 Unspecified abnormalities of gait and mobility: Secondary | ICD-10-CM | POA: Diagnosis not present

## 2019-07-23 DIAGNOSIS — I480 Paroxysmal atrial fibrillation: Secondary | ICD-10-CM | POA: Diagnosis not present

## 2019-07-23 NOTE — Progress Notes (Signed)
Subjective:    Patient ID: [name], female    DOB: 1928/01/14, 85 y.o.   MRN: [id number]  HPI   CC- CV temporal CVA s/p 2 seizures    Had another stroke was admitted this weekend- was only kept for a couple of hours- slurring words, droopy L eye, and L facial droop.  Family had gone to store and had to go get her.  Feeling fair- still weak- discussed changing seizure medicine- was changed- is Keppra 3x/day- now-    Went over side effects of Keppra- irritability and sedation.   Feeling drowsy, per pt- sleeps quite a bit.  Doing PT and OT- holding SLP for a little while since seeing so many doctors- doing PT/OT 2x/week-   Has trouble picking up things.   Has a CNA with them 24/7- for 4 weeks- assisting with her bathing- has a shower bench; sitting in travel w/c; using RW- also has rollator, until therapy OK's it.    Pain Inventory Average Pain 0 Pain Right Now 0 My pain is na  In the last 24 hours, has pain interfered with the following? General activity 0 Relation with others 0 Enjoyment of life 0 What TIME of day is your pain at its worst? na Sleep (in general) Fair  Pain is worse with: na Pain improves with: na Relief from Meds: na  Mobility walk with assistance use a walker do you drive?  no needs help with transfers  Function not employed: date last employed . I need assistance with the following:  bathing, meal prep and household duties  Neuro/Psych No problems in this area  Prior Studies Any changes since last visit?  no  Physicians involved in your care Any changes since last visit?  no   Family History  Problem Relation Age of Onset  . Cancer Mother    Social History   Socioeconomic History  . Marital status: Married    Spouse name: Not on file  . Number of children: Not on file  . Years of education: Not on file  . Highest education level: Not on file  Occupational History  . Not on file  Social Needs  . Financial  resource strain: Not on file  . Food insecurity    Worry: Not on file    Inability: Not on file  . Transportation needs    Medical: Patient refused    Non-medical: Patient refused  Tobacco Use  . Smoking status: Never Smoker  . Smokeless tobacco: Never Used  Substance and Sexual Activity  . Alcohol use: Not Currently  . Drug use: No  . Sexual activity: Not on file  Lifestyle  . Physical activity    Days per week: Not on file    Minutes per session: Not on file  . Stress: Not on file  Relationships  . Social Herbalist on phone: Patient refused    Gets together: Patient refused    Attends religious service: Patient refused    Active member of club or organization: Patient refused    Attends meetings of clubs or organizations: Patient refused    Relationship status: Patient refused  Other Topics Concern  . Not on file  Social History Narrative  . Not on file   Past Surgical History:  Procedure Laterality Date  . AMPUTATION Right 02/04/2013   Procedure: [name] ;  Surgeon: Wylene Simmer, MD;  Location: [place];  Service: Orthopedics;  Laterality: Right;  .  AMPUTATION TOE Right 09/03/2018   Procedure: AMPUTATION 3RD RIGHT TOE INTERPHALANGEAL;  Surgeon: [name], DPM;  [facility];  Service: Podiatry;  Laterality: Right;  . APPENDECTOMY    . COLONOSCOPY    . EYE SURGERY     both cataracts  . FEMORAL ARTERY EXPLORATION Right 04/12/2017   Procedure: EVACUATION HEMATOMA RIGHT GROIN WITH FEMORAL ARTERY REPAIR;  Surgeon: Rosetta Posner, MD;  Location: [place];  Service: Vascular;  Laterality: Right;  . INCISION AND DRAINAGE  2011   left hip inf-hemovac  . IR ANGIOGRAM EXTREMITY LEFT  03/25/2017  . IR ANGIOGRAM EXTREMITY LEFT  04/08/2017  . IR ANGIOGRAM SELECTIVE EACH ADDITIONAL VESSEL  03/25/2017  . IR FEM POP ART STENT INC PTA MOD SED  03/25/2017  . IR INFUSION THROMBOL ARTERIAL INITIAL (MS)  04/08/2017  . IR RADIOLOGIST EVAL & MGMT   02/26/2017  . IR RADIOLOGIST EVAL & MGMT  04/03/2017  . IR RADIOLOGIST EVAL & MGMT  08/20/2017  . IR RADIOLOGIST EVAL & MGMT  01/22/2018  . IR RADIOLOGIST EVAL & MGMT  01/13/2019  . IR THROMB F/U EVAL ART/VEN FINAL DAY (MS)  04/09/2017  . IR TIB-PERO ART PTA MOD SED  03/25/2017  . IR US GUIDE VASC ACCESS RIGHT  03/25/2017  . IR US GUIDE VASC ACCESS RIGHT  04/08/2017  . KNEE ARTHROSCOPY  1995   left  . NECK MASS EXCISION    . TONSILLECTOMY    . TOTAL HIP ARTHROPLASTY  2006   left-fx  . TOTAL KNEE ARTHROPLASTY  [id number]   left  . TOTAL KNEE ARTHROPLASTY  2009   right   Past Medical History:  Diagnosis Date  . Acute osteomyelitis ([place]) 08/25/2018   Right toe  . Anemia   . Atrial fibrillation ([place])   . Carpal tunnel syndrome, bilateral   . Cellulitis 08/25/2018   Right foot  . Cervical lymphadenopathy   . Compression fracture of fifth lumbar vertebra (HCC)    to T 11  . CVA (cerebral vascular accident) ([place]) 06/26/2019  . DDD (degenerative disc disease), lumbar   . DJD (degenerative joint disease)   . DOE (dyspnea on exertion)   . Dysphagia   . Dyspnea    with exertion  . Dysrhythmia    hx brief AF post op hip surg  . Esophageal dysmotility 01/31/2014   noted barium  . GERD (gastroesophageal reflux disease)   . Hematoma    right side after stent placement  . History of hiatal hernia 01/31/2014   Small, noted on Barium  . Hyperlipidemia   . Hypertension   . Jaundice   . Low back pain   . MRSA (methicillin resistant Staphylococcus aureus)   . Neuropathy   . Nocturia   . OA (osteoarthritis)   . Osteoporosis   . Ovarian cyst   . PAD (peripheral artery disease) ([place])   . Peripheral vascular disease ([place])   . Pleural effusion, left 04/17/2017   Small  . RA (rheumatoid arthritis) ([place])   . Spinal stenosis   . Toe ulcer, right, with necrosis of bone ([place]) 08/25/2018  . Type 2 diabetes mellitus ([place])   . Unsteady gait   . Urinary incontinence   . Uterine polyp   .  Varicose vein of leg    Bilateral   BP 120/76   Pulse 82   Temp 98 F (36.7 C)   Ht 5\' 3"  (1.6 m)   Wt 110 lb (49.9 kg)  SpO2 98%   BMI 19.49 kg/m   Opioid Risk Score:   Fall Risk Score:  `1  Depression screen PHQ 2/9  Depression screen PHQ 2/9 08/31/2018  Decreased Interest 0  Down, Depressed, Hopeless 0  PHQ - 2 Score 0     Review of Systems  Constitutional: Negative.   HENT: Negative.   Eyes: Negative.   Respiratory: Negative.   Cardiovascular: Negative.   Gastrointestinal: Negative.   Endocrine: Negative.   Genitourinary: Negative.   Musculoskeletal: Positive for gait problem.  Skin: Negative.   Allergic/Immunologic: Negative.   Neurological: Positive for weakness.  Hematological: Negative.   Psychiatric/Behavioral: Negative.   All other systems reviewed and are negative. put camera in room since likes to get up- has potty chair in room.     Objective:   Physical Exam    MS: 4+/5 in LUE-; 5-/5 in RUE LUE 4+/5 except DF 4/5 and PF 4/5 RUE- 5-/5 in RLE  Neuro: intact to LT in all 4 extremities except tips of fingers HEENT_ has bruise on forehead and open superficial wound on forehead as well Has a little nystagmus to R with lateral gaze Has intact EOMs movement B/L No facial droop noted right now- smile equal No lid lag/droppy eyes Intact sensation to light touch on face as well (-) hoffman's or clonus B/L     Assessment & Plan:   Pt is a 85 yr old female with RA, R temporal CVA, and new TIA vs stroke this weekend- s/p multiple knee replacements and L hip replacement. On seizure prophylaxis since also had 2 seizures.   1. Suggest Vit B complex IF she gets irritable on Keppra- sedation should improve. Dose can only be changed by Neurology- scheduled to see them on 9/30.  2. Discussed spasticity and possibility of developing it- and there are treatments for it- the earlier the better, for treatments- so will have it by 3 months,, if develops it.   3. F/U in 2.5 to 3 months - call if need [place] earlier.  Spent a total of 20 minutes on appointment- more than 10 minutes discussing side effects of keppra and sedation and spasticity.

## 2019-07-23 NOTE — Patient Instructions (Signed)
Pt is a 85 yr old female with RA, R temporal CVA, and new TIA vs stroke this weekend- s/p multiple knee replacements and L hip replacement. On seizure prophylaxis since also had 2 seizures.   1. Suggest Vit B complex IF she gets irritable on Keppra- sedation should improve. Dose can only be changed by Neurology- scheduled to see them on 9/30.  2. Discussed spasticity and possibility of developing it- and there are treatments for it- the earlier the better, for treatments- so will have it by 3 months,, if develops it.  3. F/U in 2.5 to 3 months - call if need [place] earlier.

## 2019-07-27 DIAGNOSIS — I69354 Hemiplegia and hemiparesis following cerebral infarction affecting left non-dominant side: Secondary | ICD-10-CM | POA: Diagnosis not present

## 2019-07-27 DIAGNOSIS — E1165 Type 2 diabetes mellitus with hyperglycemia: Secondary | ICD-10-CM | POA: Diagnosis not present

## 2019-07-27 DIAGNOSIS — E1151 Type 2 diabetes mellitus with diabetic peripheral angiopathy without gangrene: Secondary | ICD-10-CM | POA: Diagnosis not present

## 2019-07-27 DIAGNOSIS — I10 Essential (primary) hypertension: Secondary | ICD-10-CM | POA: Diagnosis not present

## 2019-07-27 DIAGNOSIS — I69328 Other speech and language deficits following cerebral infarction: Secondary | ICD-10-CM | POA: Diagnosis not present

## 2019-07-27 DIAGNOSIS — I480 Paroxysmal atrial fibrillation: Secondary | ICD-10-CM | POA: Diagnosis not present

## 2019-07-28 DIAGNOSIS — I69328 Other speech and language deficits following cerebral infarction: Secondary | ICD-10-CM | POA: Diagnosis not present

## 2019-07-28 DIAGNOSIS — E1165 Type 2 diabetes mellitus with hyperglycemia: Secondary | ICD-10-CM | POA: Diagnosis not present

## 2019-07-28 DIAGNOSIS — I10 Essential (primary) hypertension: Secondary | ICD-10-CM | POA: Diagnosis not present

## 2019-07-28 DIAGNOSIS — I69354 Hemiplegia and hemiparesis following cerebral infarction affecting left non-dominant side: Secondary | ICD-10-CM | POA: Diagnosis not present

## 2019-07-28 DIAGNOSIS — E1151 Type 2 diabetes mellitus with diabetic peripheral angiopathy without gangrene: Secondary | ICD-10-CM | POA: Diagnosis not present

## 2019-07-28 DIAGNOSIS — I480 Paroxysmal atrial fibrillation: Secondary | ICD-10-CM | POA: Diagnosis not present

## 2019-07-29 DIAGNOSIS — E1151 Type 2 diabetes mellitus with diabetic peripheral angiopathy without gangrene: Secondary | ICD-10-CM | POA: Diagnosis not present

## 2019-07-29 DIAGNOSIS — E1165 Type 2 diabetes mellitus with hyperglycemia: Secondary | ICD-10-CM | POA: Diagnosis not present

## 2019-07-29 DIAGNOSIS — I69328 Other speech and language deficits following cerebral infarction: Secondary | ICD-10-CM | POA: Diagnosis not present

## 2019-07-29 DIAGNOSIS — I480 Paroxysmal atrial fibrillation: Secondary | ICD-10-CM | POA: Diagnosis not present

## 2019-07-29 DIAGNOSIS — I10 Essential (primary) hypertension: Secondary | ICD-10-CM | POA: Diagnosis not present

## 2019-07-29 DIAGNOSIS — I69354 Hemiplegia and hemiparesis following cerebral infarction affecting left non-dominant side: Secondary | ICD-10-CM | POA: Diagnosis not present

## 2019-07-30 DIAGNOSIS — I10 Essential (primary) hypertension: Secondary | ICD-10-CM | POA: Diagnosis not present

## 2019-07-30 DIAGNOSIS — I69354 Hemiplegia and hemiparesis following cerebral infarction affecting left non-dominant side: Secondary | ICD-10-CM | POA: Diagnosis not present

## 2019-07-30 DIAGNOSIS — E1165 Type 2 diabetes mellitus with hyperglycemia: Secondary | ICD-10-CM | POA: Diagnosis not present

## 2019-07-30 DIAGNOSIS — I69328 Other speech and language deficits following cerebral infarction: Secondary | ICD-10-CM | POA: Diagnosis not present

## 2019-07-30 DIAGNOSIS — I480 Paroxysmal atrial fibrillation: Secondary | ICD-10-CM | POA: Diagnosis not present

## 2019-07-30 DIAGNOSIS — E1151 Type 2 diabetes mellitus with diabetic peripheral angiopathy without gangrene: Secondary | ICD-10-CM | POA: Diagnosis not present

## 2019-08-02 DIAGNOSIS — I69354 Hemiplegia and hemiparesis following cerebral infarction affecting left non-dominant side: Secondary | ICD-10-CM | POA: Diagnosis not present

## 2019-08-02 DIAGNOSIS — I480 Paroxysmal atrial fibrillation: Secondary | ICD-10-CM | POA: Diagnosis not present

## 2019-08-02 DIAGNOSIS — I69328 Other speech and language deficits following cerebral infarction: Secondary | ICD-10-CM | POA: Diagnosis not present

## 2019-08-02 DIAGNOSIS — E1151 Type 2 diabetes mellitus with diabetic peripheral angiopathy without gangrene: Secondary | ICD-10-CM | POA: Diagnosis not present

## 2019-08-02 DIAGNOSIS — E1165 Type 2 diabetes mellitus with hyperglycemia: Secondary | ICD-10-CM | POA: Diagnosis not present

## 2019-08-02 DIAGNOSIS — I10 Essential (primary) hypertension: Secondary | ICD-10-CM | POA: Diagnosis not present

## 2019-08-03 DIAGNOSIS — I480 Paroxysmal atrial fibrillation: Secondary | ICD-10-CM | POA: Diagnosis not present

## 2019-08-03 DIAGNOSIS — I10 Essential (primary) hypertension: Secondary | ICD-10-CM | POA: Diagnosis not present

## 2019-08-03 DIAGNOSIS — I69328 Other speech and language deficits following cerebral infarction: Secondary | ICD-10-CM | POA: Diagnosis not present

## 2019-08-03 DIAGNOSIS — I69354 Hemiplegia and hemiparesis following cerebral infarction affecting left non-dominant side: Secondary | ICD-10-CM | POA: Diagnosis not present

## 2019-08-03 DIAGNOSIS — E1151 Type 2 diabetes mellitus with diabetic peripheral angiopathy without gangrene: Secondary | ICD-10-CM | POA: Diagnosis not present

## 2019-08-03 DIAGNOSIS — E1165 Type 2 diabetes mellitus with hyperglycemia: Secondary | ICD-10-CM | POA: Diagnosis not present

## 2019-08-04 DIAGNOSIS — E1165 Type 2 diabetes mellitus with hyperglycemia: Secondary | ICD-10-CM | POA: Diagnosis not present

## 2019-08-04 DIAGNOSIS — E1151 Type 2 diabetes mellitus with diabetic peripheral angiopathy without gangrene: Secondary | ICD-10-CM | POA: Diagnosis not present

## 2019-08-04 DIAGNOSIS — I10 Essential (primary) hypertension: Secondary | ICD-10-CM | POA: Diagnosis not present

## 2019-08-04 DIAGNOSIS — I69354 Hemiplegia and hemiparesis following cerebral infarction affecting left non-dominant side: Secondary | ICD-10-CM | POA: Diagnosis not present

## 2019-08-04 DIAGNOSIS — I69328 Other speech and language deficits following cerebral infarction: Secondary | ICD-10-CM | POA: Diagnosis not present

## 2019-08-04 DIAGNOSIS — I480 Paroxysmal atrial fibrillation: Secondary | ICD-10-CM | POA: Diagnosis not present

## 2019-08-05 DIAGNOSIS — I69354 Hemiplegia and hemiparesis following cerebral infarction affecting left non-dominant side: Secondary | ICD-10-CM | POA: Diagnosis not present

## 2019-08-05 DIAGNOSIS — I69328 Other speech and language deficits following cerebral infarction: Secondary | ICD-10-CM | POA: Diagnosis not present

## 2019-08-05 DIAGNOSIS — I10 Essential (primary) hypertension: Secondary | ICD-10-CM | POA: Diagnosis not present

## 2019-08-05 DIAGNOSIS — I480 Paroxysmal atrial fibrillation: Secondary | ICD-10-CM | POA: Diagnosis not present

## 2019-08-05 DIAGNOSIS — E1151 Type 2 diabetes mellitus with diabetic peripheral angiopathy without gangrene: Secondary | ICD-10-CM | POA: Diagnosis not present

## 2019-08-05 DIAGNOSIS — E1165 Type 2 diabetes mellitus with hyperglycemia: Secondary | ICD-10-CM | POA: Diagnosis not present

## 2019-08-09 DIAGNOSIS — I10 Essential (primary) hypertension: Secondary | ICD-10-CM | POA: Diagnosis not present

## 2019-08-09 DIAGNOSIS — E1151 Type 2 diabetes mellitus with diabetic peripheral angiopathy without gangrene: Secondary | ICD-10-CM | POA: Diagnosis not present

## 2019-08-09 DIAGNOSIS — I69354 Hemiplegia and hemiparesis following cerebral infarction affecting left non-dominant side: Secondary | ICD-10-CM | POA: Diagnosis not present

## 2019-08-09 DIAGNOSIS — E1165 Type 2 diabetes mellitus with hyperglycemia: Secondary | ICD-10-CM | POA: Diagnosis not present

## 2019-08-09 DIAGNOSIS — I69328 Other speech and language deficits following cerebral infarction: Secondary | ICD-10-CM | POA: Diagnosis not present

## 2019-08-09 DIAGNOSIS — I480 Paroxysmal atrial fibrillation: Secondary | ICD-10-CM | POA: Diagnosis not present

## 2019-08-10 DIAGNOSIS — I10 Essential (primary) hypertension: Secondary | ICD-10-CM | POA: Diagnosis not present

## 2019-08-10 DIAGNOSIS — E1151 Type 2 diabetes mellitus with diabetic peripheral angiopathy without gangrene: Secondary | ICD-10-CM | POA: Diagnosis not present

## 2019-08-10 DIAGNOSIS — E1165 Type 2 diabetes mellitus with hyperglycemia: Secondary | ICD-10-CM | POA: Diagnosis not present

## 2019-08-10 DIAGNOSIS — I480 Paroxysmal atrial fibrillation: Secondary | ICD-10-CM | POA: Diagnosis not present

## 2019-08-10 DIAGNOSIS — I69354 Hemiplegia and hemiparesis following cerebral infarction affecting left non-dominant side: Secondary | ICD-10-CM | POA: Diagnosis not present

## 2019-08-10 DIAGNOSIS — I69328 Other speech and language deficits following cerebral infarction: Secondary | ICD-10-CM | POA: Diagnosis not present

## 2019-08-11 DIAGNOSIS — Z89422 Acquired absence of other left toe(s): Secondary | ICD-10-CM

## 2019-08-11 DIAGNOSIS — E785 Hyperlipidemia, unspecified: Secondary | ICD-10-CM

## 2019-08-11 DIAGNOSIS — K219 Gastro-esophageal reflux disease without esophagitis: Secondary | ICD-10-CM

## 2019-08-11 DIAGNOSIS — M4800 Spinal stenosis, site unspecified: Secondary | ICD-10-CM

## 2019-08-11 DIAGNOSIS — M810 Age-related osteoporosis without current pathological fracture: Secondary | ICD-10-CM | POA: Diagnosis not present

## 2019-08-11 DIAGNOSIS — E1165 Type 2 diabetes mellitus with hyperglycemia: Secondary | ICD-10-CM | POA: Diagnosis not present

## 2019-08-11 DIAGNOSIS — M1990 Unspecified osteoarthritis, unspecified site: Secondary | ICD-10-CM | POA: Diagnosis not present

## 2019-08-11 DIAGNOSIS — Z7982 Long term (current) use of aspirin: Secondary | ICD-10-CM

## 2019-08-11 DIAGNOSIS — I480 Paroxysmal atrial fibrillation: Secondary | ICD-10-CM | POA: Diagnosis not present

## 2019-08-11 DIAGNOSIS — R32 Unspecified urinary incontinence: Secondary | ICD-10-CM

## 2019-08-11 DIAGNOSIS — Z955 Presence of coronary angioplasty implant and graft: Secondary | ICD-10-CM

## 2019-08-11 DIAGNOSIS — M5136 Other intervertebral disc degeneration, lumbar region: Secondary | ICD-10-CM | POA: Diagnosis not present

## 2019-08-11 DIAGNOSIS — I69328 Other speech and language deficits following cerebral infarction: Secondary | ICD-10-CM | POA: Diagnosis not present

## 2019-08-11 DIAGNOSIS — Z9181 History of falling: Secondary | ICD-10-CM

## 2019-08-11 DIAGNOSIS — Z89421 Acquired absence of other right toe(s): Secondary | ICD-10-CM

## 2019-08-11 DIAGNOSIS — D649 Anemia, unspecified: Secondary | ICD-10-CM

## 2019-08-11 DIAGNOSIS — G5603 Carpal tunnel syndrome, bilateral upper limbs: Secondary | ICD-10-CM

## 2019-08-11 DIAGNOSIS — E1151 Type 2 diabetes mellitus with diabetic peripheral angiopathy without gangrene: Secondary | ICD-10-CM | POA: Diagnosis not present

## 2019-08-11 DIAGNOSIS — Z7984 Long term (current) use of oral hypoglycemic drugs: Secondary | ICD-10-CM

## 2019-08-11 DIAGNOSIS — Z7901 Long term (current) use of anticoagulants: Secondary | ICD-10-CM

## 2019-08-11 DIAGNOSIS — M069 Rheumatoid arthritis, unspecified: Secondary | ICD-10-CM | POA: Diagnosis not present

## 2019-08-11 DIAGNOSIS — I8393 Asymptomatic varicose veins of bilateral lower extremities: Secondary | ICD-10-CM

## 2019-08-11 DIAGNOSIS — I10 Essential (primary) hypertension: Secondary | ICD-10-CM | POA: Diagnosis not present

## 2019-08-11 DIAGNOSIS — E1140 Type 2 diabetes mellitus with diabetic neuropathy, unspecified: Secondary | ICD-10-CM

## 2019-08-12 DIAGNOSIS — I69354 Hemiplegia and hemiparesis following cerebral infarction affecting left non-dominant side: Secondary | ICD-10-CM | POA: Diagnosis not present

## 2019-08-12 DIAGNOSIS — I10 Essential (primary) hypertension: Secondary | ICD-10-CM | POA: Diagnosis not present

## 2019-08-12 DIAGNOSIS — I69328 Other speech and language deficits following cerebral infarction: Secondary | ICD-10-CM | POA: Diagnosis not present

## 2019-08-12 DIAGNOSIS — I480 Paroxysmal atrial fibrillation: Secondary | ICD-10-CM | POA: Diagnosis not present

## 2019-08-12 DIAGNOSIS — E1151 Type 2 diabetes mellitus with diabetic peripheral angiopathy without gangrene: Secondary | ICD-10-CM | POA: Diagnosis not present

## 2019-08-12 DIAGNOSIS — E1165 Type 2 diabetes mellitus with hyperglycemia: Secondary | ICD-10-CM | POA: Diagnosis not present

## 2019-08-13 DIAGNOSIS — I69328 Other speech and language deficits following cerebral infarction: Secondary | ICD-10-CM | POA: Diagnosis not present

## 2019-08-13 DIAGNOSIS — E1165 Type 2 diabetes mellitus with hyperglycemia: Secondary | ICD-10-CM | POA: Diagnosis not present

## 2019-08-13 DIAGNOSIS — I69354 Hemiplegia and hemiparesis following cerebral infarction affecting left non-dominant side: Secondary | ICD-10-CM | POA: Diagnosis not present

## 2019-08-13 DIAGNOSIS — E1151 Type 2 diabetes mellitus with diabetic peripheral angiopathy without gangrene: Secondary | ICD-10-CM | POA: Diagnosis not present

## 2019-08-13 DIAGNOSIS — I10 Essential (primary) hypertension: Secondary | ICD-10-CM | POA: Diagnosis not present

## 2019-08-13 DIAGNOSIS — I480 Paroxysmal atrial fibrillation: Secondary | ICD-10-CM | POA: Diagnosis not present

## 2019-08-14 DIAGNOSIS — K219 Gastro-esophageal reflux disease without esophagitis: Secondary | ICD-10-CM | POA: Diagnosis not present

## 2019-08-14 DIAGNOSIS — Z89421 Acquired absence of other right toe(s): Secondary | ICD-10-CM | POA: Diagnosis not present

## 2019-08-14 DIAGNOSIS — M5136 Other intervertebral disc degeneration, lumbar region: Secondary | ICD-10-CM | POA: Diagnosis not present

## 2019-08-14 DIAGNOSIS — Z89422 Acquired absence of other left toe(s): Secondary | ICD-10-CM | POA: Diagnosis not present

## 2019-08-14 DIAGNOSIS — E1140 Type 2 diabetes mellitus with diabetic neuropathy, unspecified: Secondary | ICD-10-CM | POA: Diagnosis not present

## 2019-08-14 DIAGNOSIS — I480 Paroxysmal atrial fibrillation: Secondary | ICD-10-CM | POA: Diagnosis not present

## 2019-08-14 DIAGNOSIS — Z7984 Long term (current) use of oral hypoglycemic drugs: Secondary | ICD-10-CM | POA: Diagnosis not present

## 2019-08-14 DIAGNOSIS — Z955 Presence of coronary angioplasty implant and graft: Secondary | ICD-10-CM | POA: Diagnosis not present

## 2019-08-14 DIAGNOSIS — Z7982 Long term (current) use of aspirin: Secondary | ICD-10-CM | POA: Diagnosis not present

## 2019-08-14 DIAGNOSIS — E1165 Type 2 diabetes mellitus with hyperglycemia: Secondary | ICD-10-CM | POA: Diagnosis not present

## 2019-08-14 DIAGNOSIS — Z9181 History of falling: Secondary | ICD-10-CM | POA: Diagnosis not present

## 2019-08-14 DIAGNOSIS — M4800 Spinal stenosis, site unspecified: Secondary | ICD-10-CM | POA: Diagnosis not present

## 2019-08-14 DIAGNOSIS — Z7901 Long term (current) use of anticoagulants: Secondary | ICD-10-CM | POA: Diagnosis not present

## 2019-08-14 DIAGNOSIS — E785 Hyperlipidemia, unspecified: Secondary | ICD-10-CM | POA: Diagnosis not present

## 2019-08-14 DIAGNOSIS — I10 Essential (primary) hypertension: Secondary | ICD-10-CM | POA: Diagnosis not present

## 2019-08-14 DIAGNOSIS — I69328 Other speech and language deficits following cerebral infarction: Secondary | ICD-10-CM | POA: Diagnosis not present

## 2019-08-14 DIAGNOSIS — D649 Anemia, unspecified: Secondary | ICD-10-CM | POA: Diagnosis not present

## 2019-08-14 DIAGNOSIS — M069 Rheumatoid arthritis, unspecified: Secondary | ICD-10-CM | POA: Diagnosis not present

## 2019-08-14 DIAGNOSIS — G5603 Carpal tunnel syndrome, bilateral upper limbs: Secondary | ICD-10-CM | POA: Diagnosis not present

## 2019-08-14 DIAGNOSIS — M810 Age-related osteoporosis without current pathological fracture: Secondary | ICD-10-CM | POA: Diagnosis not present

## 2019-08-14 DIAGNOSIS — I69354 Hemiplegia and hemiparesis following cerebral infarction affecting left non-dominant side: Secondary | ICD-10-CM | POA: Diagnosis not present

## 2019-08-14 DIAGNOSIS — I8393 Asymptomatic varicose veins of bilateral lower extremities: Secondary | ICD-10-CM | POA: Diagnosis not present

## 2019-08-14 DIAGNOSIS — M1990 Unspecified osteoarthritis, unspecified site: Secondary | ICD-10-CM | POA: Diagnosis not present

## 2019-08-14 DIAGNOSIS — E1151 Type 2 diabetes mellitus with diabetic peripheral angiopathy without gangrene: Secondary | ICD-10-CM | POA: Diagnosis not present

## 2019-08-14 DIAGNOSIS — R32 Unspecified urinary incontinence: Secondary | ICD-10-CM | POA: Diagnosis not present

## 2019-08-16 ENCOUNTER — Telehealth: Payer: Self-pay | Admitting: *Deleted

## 2019-08-16 DIAGNOSIS — E1165 Type 2 diabetes mellitus with hyperglycemia: Secondary | ICD-10-CM | POA: Diagnosis not present

## 2019-08-16 DIAGNOSIS — I480 Paroxysmal atrial fibrillation: Secondary | ICD-10-CM | POA: Diagnosis not present

## 2019-08-16 DIAGNOSIS — E1151 Type 2 diabetes mellitus with diabetic peripheral angiopathy without gangrene: Secondary | ICD-10-CM | POA: Diagnosis not present

## 2019-08-16 DIAGNOSIS — I69328 Other speech and language deficits following cerebral infarction: Secondary | ICD-10-CM | POA: Diagnosis not present

## 2019-08-16 DIAGNOSIS — I69354 Hemiplegia and hemiparesis following cerebral infarction affecting left non-dominant side: Secondary | ICD-10-CM | POA: Diagnosis not present

## 2019-08-16 DIAGNOSIS — I10 Essential (primary) hypertension: Secondary | ICD-10-CM | POA: Diagnosis not present

## 2019-08-16 NOTE — Telephone Encounter (Signed)
Brandon OT [facility] called for OT extension for 2 wk3, 1 wk1.  Approval given.

## 2019-08-18 ENCOUNTER — Encounter: Payer: Self-pay | Admitting: Adult Health

## 2019-08-18 ENCOUNTER — Other Ambulatory Visit: Payer: Self-pay

## 2019-08-18 ENCOUNTER — Ambulatory Visit (INDEPENDENT_AMBULATORY_CARE_PROVIDER_SITE_OTHER): Payer: Medicare Other | Admitting: Adult Health

## 2019-08-18 VITALS — BP 126/64 | HR 76 | Temp 96.0°F | Ht 63.0 in | Wt 115.8 lb

## 2019-08-18 DIAGNOSIS — E1165 Type 2 diabetes mellitus with hyperglycemia: Secondary | ICD-10-CM | POA: Diagnosis not present

## 2019-08-18 DIAGNOSIS — E785 Hyperlipidemia, unspecified: Secondary | ICD-10-CM

## 2019-08-18 DIAGNOSIS — R569 Unspecified convulsions: Secondary | ICD-10-CM

## 2019-08-18 DIAGNOSIS — I69354 Hemiplegia and hemiparesis following cerebral infarction affecting left non-dominant side: Secondary | ICD-10-CM | POA: Diagnosis not present

## 2019-08-18 DIAGNOSIS — I6389 Other cerebral infarction: Secondary | ICD-10-CM

## 2019-08-18 DIAGNOSIS — E1151 Type 2 diabetes mellitus with diabetic peripheral angiopathy without gangrene: Secondary | ICD-10-CM | POA: Diagnosis not present

## 2019-08-18 DIAGNOSIS — Z79899 Other long term (current) drug therapy: Secondary | ICD-10-CM

## 2019-08-18 DIAGNOSIS — I69328 Other speech and language deficits following cerebral infarction: Secondary | ICD-10-CM | POA: Diagnosis not present

## 2019-08-18 DIAGNOSIS — I480 Paroxysmal atrial fibrillation: Secondary | ICD-10-CM | POA: Diagnosis not present

## 2019-08-18 DIAGNOSIS — I10 Essential (primary) hypertension: Secondary | ICD-10-CM | POA: Diagnosis not present

## 2019-08-18 MED ORDER — DIVALPROEX SODIUM 250 MG PO DR TAB: 250.0000 mg | DELAYED_RELEASE_TABLET | Freq: Three times a day (TID) | ORAL | 3 refills | Status: DC

## 2019-08-18 NOTE — Progress Notes (Signed)
I agree with the above plan 

## 2019-08-18 NOTE — Progress Notes (Signed)
[facility] [address] [place]. [place] [address] [phone]       [place]  Ms. [name] Date of Birth:  [birth year] Medical Record Number:  [id number]   Reason for Referral:  hospital stroke follow up    CHIEF COMPLAINT:  Chief Complaint  Patient presents with  . Hospitalization Follow-up    Daughter in law present. Rm 9. No new concerns at this time.     HPI: [name] being seen today for in office hospital follow-up regarding right MCA stroke 06/26/2019 with subsequent seizure activity.  History obtained from patient, daughter and chart review. Reviewed all radiology images and labs personally.  [name] is an 85 y.o. Caucasian female with PMH of Afib on Coumadin, diabetes mellitus, rheumatoid arthritis,HLD, HTN, RA,peripheral artery diseasewho was admitted from 06/26/2019-06/28/2019 after presenteing withtransient speech difficulties, left arm weakness /numbness and confusion who was found to have a posterior R temporal lobe infarct due to Afib with subtherapeutic INR 1.9.  She was on warfarin and aspirin 325 at the time of admission; Coumadin was changed to Eliquis and discharged to CIR on aspirin 325 and Eliquis (unable to tolerate Pradaxa previously).  Aspirin decreased during admission to 81 mg daily due to bleeding risk. Lipitor 40 was added with LDL 126.  Toprol and Metformin were continued for HTN and uncontrolled DM control with A1c 7.4.  Other stroke risk factors include advanced age and history of EtOH use but no prior history of stroke.  She has been having cognitive decline and losing independence w/ ADLs; agitation required ativan which worsened orientation and confusion. She had an episode in hospital on 06/27/19 when a RN witnessed R eyes deviation but she was coherent and answering questions which quickly resolved.  Patient admitted to rehab 06/28/2019.    Patient intermittently confused, asking same questions again and  again, fidget with both hands. As per rehab PA, she ate breakfast normally at her baseline, however when she was talking with PT, she had acute onset episode of unresponsive with slurry speech. Put in bed, pt still drowsy and dysarthria but orientated and following commands. No shaking or jerking. CT showed no acute change.  Again around lunch time, pt had episode of right gaze with unresponsiveness, lasting about 2-3 min and slowly came around but still lethargic. No shaking or jerking.  Gave ativan 1mg  IV and loaded with keppra 1g. Neuro consult was called.  EEG negative.  Symptoms possibly related to seizure secondary to right MCA infarct and recommended Keppra 500 mg twice daily for seizure prophylaxis.  She continue to work with therapies at [facility].  She was discharged home in stable condition with recommendation of outpatient therapies on 07/13/2019.  Return to ED on 07/17/2019 with acute onset of speech change and right facial droop.  MRI showed patchy progressive ischemia in the right hemisphere since 06/27/2019 in the right MCA and right MCA/PCA watershed area.  No change to current medical treatment as she is on maximum medical therapy.  She was started on valproic acid 250 mg 3 times daily due to possible sedation on Keppra and continued on Eliquis and aspirin 81 mg and discharged home in stable condition.  [name] is being seen today for hospital follow-up.  Residual deficits of generalized weakness  L>R and cognitive impairment and continues to work with home health therapy for ongoing improvement.  Previously receiving PT/OT which has continued but recently started ST this past week.  She  has completely discontinued Keppra and continues on valproic acid 250 mg 3 times daily.  She is tolerating well without residual side effects or reoccurring stroke symptoms.  Continues on Eliquis and aspirin 81 mg daily without bleeding or bruising.  Continues on atorvastatin without myalgias.  Blood pressure today  satisfactory at 126/64.  Glucose levels have been stable.  She is currently living with her husband but does have caregiver assistance due to safety concerns.  Denies new or worsening stroke/TIA symptoms.    ROS:   14 system review of systems performed and negative with exception of weakness, memory loss and confusion  PMH:  Past Medical History:  Diagnosis Date  . Acute osteomyelitis ([place]) 08/25/2018   Right toe  . Anemia   . Atrial fibrillation ([place])   . Carpal tunnel syndrome, bilateral   . Cellulitis 08/25/2018   Right foot  . Cervical lymphadenopathy   . Compression fracture of fifth lumbar vertebra (HCC)    to T 11  . CVA (cerebral vascular accident) ([place]) 06/26/2019  . DDD (degenerative disc disease), lumbar   . DJD (degenerative joint disease)   . DOE (dyspnea on exertion)   . Dysphagia   . Dyspnea    with exertion  . Dysrhythmia    hx brief AF post op hip surg  . Esophageal dysmotility 01/31/2014   noted barium  . GERD (gastroesophageal reflux disease)   . Hematoma    right side after stent placement  . History of hiatal hernia 01/31/2014   Small, noted on Barium  . Hyperlipidemia   . Hypertension   . Jaundice   . Low back pain   . MRSA (methicillin resistant Staphylococcus aureus)   . Neuropathy   . Nocturia   . OA (osteoarthritis)   . Osteoporosis   . Ovarian cyst   . PAD (peripheral artery disease) ([place])   . Peripheral vascular disease ([place])   . Pleural effusion, left 04/17/2017   Small  . RA (rheumatoid arthritis) ([place])   . Spinal stenosis   . Toe ulcer, right, with necrosis of bone ([place]) 08/25/2018  . Type 2 diabetes mellitus ([place])   . Unsteady gait   . Urinary incontinence   . Uterine polyp   . Varicose vein of leg    Bilateral    PSH:  Past Surgical History:  Procedure Laterality Date  . AMPUTATION Right 02/04/2013   Procedure: [name] ;  Surgeon: Wylene Simmer, MD;  Location: [facility];  Service:  Orthopedics;  Laterality: Right;  . AMPUTATION TOE Right 09/03/2018   Procedure: AMPUTATION 3RD RIGHT TOE INTERPHALANGEAL;  Surgeon: [name], DPM;  Location: [facility];  Service: Podiatry;  Laterality: Right;  . APPENDECTOMY    . COLONOSCOPY    . EYE SURGERY     both cataracts  . FEMORAL ARTERY EXPLORATION Right 04/12/2017   Procedure: EVACUATION HEMATOMA RIGHT GROIN WITH FEMORAL ARTERY REPAIR;  Surgeon: Rosetta Posner, MD;  Location: [facility];  Service: Vascular;  Laterality: Right;  . INCISION AND DRAINAGE  2011   left hip inf-hemovac  . IR ANGIOGRAM EXTREMITY LEFT  03/25/2017  . IR ANGIOGRAM EXTREMITY LEFT  04/08/2017  . IR ANGIOGRAM SELECTIVE EACH ADDITIONAL VESSEL  03/25/2017  . IR FEM POP ART STENT INC PTA MOD SED  03/25/2017  . IR INFUSION THROMBOL ARTERIAL INITIAL (MS)  04/08/2017  . IR RADIOLOGIST EVAL & MGMT  02/26/2017  . IR RADIOLOGIST EVAL & MGMT  04/03/2017  .  IR RADIOLOGIST EVAL & MGMT  08/20/2017  . IR RADIOLOGIST EVAL & MGMT  01/22/2018  . IR RADIOLOGIST EVAL & MGMT  01/13/2019  . IR THROMB F/U EVAL ART/VEN FINAL DAY (MS)  04/09/2017  . IR TIB-PERO ART PTA MOD SED  03/25/2017  . IR US GUIDE VASC ACCESS RIGHT  03/25/2017  . IR US GUIDE VASC ACCESS RIGHT  04/08/2017  . KNEE ARTHROSCOPY  1995   left  . NECK MASS EXCISION    . TONSILLECTOMY    . TOTAL HIP ARTHROPLASTY  2006   left-fx  . TOTAL KNEE ARTHROPLASTY  [id number]   left  . TOTAL KNEE ARTHROPLASTY  2009   right    Social History:  Social History   Socioeconomic History  . Marital status: Married    Spouse name: Not on file  . Number of children: Not on file  . Years of education: Not on file  . Highest education level: Not on file  Occupational History  . Not on file  Social Needs  . Financial resource strain: Not on file  . Food insecurity    Worry: Not on file    Inability: Not on file  . Transportation needs    Medical: Patient refused    Non-medical: Patient refused  Tobacco Use  . Smoking status: Never  Smoker  . Smokeless tobacco: Never Used  Substance and Sexual Activity  . Alcohol use: Not Currently  . Drug use: No  . Sexual activity: Not on file  Lifestyle  . Physical activity    Days per week: Not on file    Minutes per session: Not on file  . Stress: Not on file  Relationships  . Social Herbalist on phone: Patient refused    Gets together: Patient refused    Attends religious service: Patient refused    Active member of club or organization: Patient refused    Attends meetings of clubs or organizations: Patient refused    Relationship status: Patient refused  . Intimate partner violence    Fear of current or ex partner: Not on file    Emotionally abused: Not on file    Physically abused: Not on file    Forced sexual activity: Not on file  Other Topics Concern  . Not on file  Social History Narrative  . Not on file    Family History:  Family History  Problem Relation Age of Onset  . Cancer Mother     Medications:   Current Outpatient Medications on File Prior to Visit  Medication Sig Dispense Refill  . acetaminophen (TYLENOL) 325 MG tablet Take 650 mg by mouth daily with breakfast.     . amLODipine (NORVASC) 10 MG tablet Take 1 tablet (10 mg total) by mouth daily. 30 tablet 0  . apixaban (ELIQUIS) 2.5 MG TABS tablet Take 1 tablet (2.5 mg total) by mouth 2 (two) times daily. 60 tablet 0  . aspirin EC 81 MG EC tablet Take 1 tablet (81 mg total) by mouth daily.    Marland Kitchen atorvastatin (LIPITOR) 40 MG tablet Take 1 tablet (40 mg total) by mouth daily at 6 PM. 30 tablet 0  . calcium carbonate (OSCAL) 1500 (600 Ca) MG TABS tablet Take 600 mg of elemental calcium by mouth daily with breakfast.     . ergocalciferol (VITAMIN D2) 1.25 MG (50000 UT) capsule Take 50,000 Units by mouth every Sunday.     . ferrous sulfate 325 (65 FE) MG tablet  Take 325 mg by mouth daily with breakfast.    . metFORMIN (GLUCOPHAGE) 500 MG tablet Take 1 tablet (500 mg total) by mouth 2 (two)  times daily with a meal. 60 tablet 0  . metoprolol succinate (TOPROL-XL) 25 MG 24 hr tablet Take 1 tablet (25 mg total) by mouth daily with supper. 30 tablet 0  . pantoprazole (PROTONIX) 40 MG tablet Take 1 tablet (40 mg total) by mouth daily. 30 tablet 0  . vitamin B-12 (CYANOCOBALAMIN) 1000 MCG tablet Take 1,000 mcg by mouth daily with breakfast.      No current facility-administered medications on file prior to visit.     Allergies:   Allergies  Allergen Reactions  . Amprenavir Other (See Comments)    Unknown reaction  . Bactrim [Sulfamethoxazole-Trimethoprim] Nausea Only  . Ceftriaxone Sodium Other (See Comments)    Tingly feeling after given, then one hour later while vanco infusing pruritic rash  . Phenergan [Promethazine Hcl] Other (See Comments)    Hallucinations  . Sulfonamide Derivatives Hives  . Vancomycin Hcl Rash    Rash happened 1hr after rocephin but immediately after infusing vancomycin     Physical Exam  Vitals:   08/18/19 1414  BP: 126/64  Pulse: 76  Temp: (!) 96 F (35.6 C)  TempSrc: Oral  Weight: 115 lb 12.8 oz (52.5 kg)  Height: 5\' 3"  (1.6 m)   Body mass index is 20.51 kg/m. No exam data present  Depression screen [facility] 2/9 08/18/2019  Decreased Interest 0  Down, Depressed, Hopeless 0  PHQ - 2 Score 0     General: well developed, well nourished,  pleasant elderly Caucasian female, seated, in no evident distress Head: head normocephalic and atraumatic.   Neck: supple with no carotid or supraclavicular bruits Cardiovascular: irregular rate and rhythm, no murmurs Musculoskeletal: no deformity Skin:  no rash/petichiae Vascular:  Normal pulses all extremities   Neurologic Exam Mental Status: Awake and fully alert. Oriented to place and time. Recent and remote memory mildly diminished.  Attention span, concentration and fund of knowledge diminished. Mood and affect appropriate.  Cranial Nerves: Fundoscopic exam reveals sharp disc margins. Pupils  equal, briskly reactive to light. Extraocular movements full without nystagmus. Visual fields full to confrontation. Hearing intact. Facial sensation intact. Face, tongue, palate moves normally and symmetrically.  Motor: Normal bulk and tone.  Generalized mild weakness throughout all extremities. Sensory.: intact to touch , pinprick , position and vibratory sensation.  Coordination: Rapid alternating movements normal in all extremities. Finger-to-nose and heel-to-shin performed accurately bilaterally. Gait and Station: Arises from chair without difficulty. Stance is hunched.  Gait abnormality with quick shuffling steps and imbalance with use of [name] walker Reflexes: 1+ and symmetric. Toes downgoing.      Diagnostic Data (Labs, Imaging, Testing)  CT HEAD WO CONTRAST 06/26/2019 IMPRESSION: 1. No acute intracranial abnormalities are identified.  MR BRAIN WO CONTRAST MR MRA HEAD 06/26/2019 IMPRESSION: 1. 2-3 small foci of acute to early subacute ischemia within the posterior right temporal lobe, within the PCA territory. 2. 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 3. Short segment occlusion of the right MCA M1 segment, just proximal to the bifurcation, with normal M2 branches. 4. Moderate stenosis of the left M2 inferior division.  MR BRAIN WO CONTRAST 07/17/2019 IMPRESSION: 1. Patchy progressed ischemia in the right hemisphere since 06/27/2019 is in the right MCA and right MCA/PCA watershed area. No associated hemorrhage  or mass effect. 2. Expected evolution of the small right PCA territory infarcts. Stable noncontrast MRI appearance of the brain elsewhere.   ECHOCARDIOGRAM 06/27/2019 IMPRESSIONS  1. The left ventricle has normal systolic function, with an ejection fraction of 55-60%. The cavity size was normal. There is moderately increased left ventricular wall thickness. Left ventricular  diastolic function could not be evaluated secondary to  atrial fibrillation.  2. The right ventricle has normal systolic function. The cavity was normal. There is no increase in right ventricular wall thickness. Right ventricular systolic pressure is normal with an estimated pressure of 21.0 mmHg.  3. Left atrial size was moderately dilated.  4. Right atrial size was mildly dilated.  5. The aortic valve is tricuspid. Moderate thickening of the aortic valve. Moderate calcification of the aortic valve. Mild stenosis of the aortic valve. Severe aortic annular calcification noted.  6. The mitral valve is degenerative. Mild thickening of the mitral valve leaflet. Mild calcification of the mitral valve leaflet. There is mild mitral annular calcification present.  7. The aorta is normal in size and structure.  EEG 06/29/2019 IMPRESSION: This study is within normal limits. No seizures or definite epileptiform discharges were seen throughout the recording. If suspicion for interictal activity remains a concern, a prolonged study should be considered.     ASSESSMENT: [name] is a 85 y.o. year old female presented with transient speech difficulties, left arm weakness and confusion on 06/26/2019 found to have posterior right temporal lobe infarct due to A. fib on subtherapeutic INR therefore transitioned from warfarin to Eliquis.  She also had episode of possible seizure activity and initiated on Keppra for seizure prophylaxis.  Return to ED on 07/17/2019 with extension of prior stroke. Vascular risk factors include A. fib on AC, DM, HLD, HTN, PAD.  Residual deficits of generalized weakness and cognitive impairment but continues to improve.    PLAN:  1. Right MCA stroke: Continue aspirin 81 mg daily and Eliquis (apixaban) daily  and Lipitor for secondary stroke prevention. Maintain strict control of hypertension with blood pressure goal below 130/90, diabetes with hemoglobin A1c goal below 6.5% and  cholesterol with LDL cholesterol (bad cholesterol) goal below 70 mg/dL.  I also advised the patient to eat a healthy diet with plenty of whole grains, cereals, fruits and vegetables, exercise regularly with at least 30 minutes of continuous activity daily and maintain ideal body weight. 2. Atrial fibrillation: continuation of Eliquis and ongoing follow-up with cardiology for monitoring and management 3. Seizure S/P stroke: Continuation of valproic acid 250 mg 3 times daily.  Will obtain valproic acid levels today 4. HTN: Advised to continue current treatment regimen.  Today's BP satisfactory.  Advised to continue to monitor at home along with continued follow-up with PCP for management 5. HLD: Advised to continue current treatment regimen along with continued follow-up with PCP for future prescribing and monitoring of lipid panel 6. DMII: Advised to continue to monitor glucose levels at home along with continued follow-up with PCP for management and monitoring 7. Residual deficits: Continuation of home health therapies with possible transition to outpatient therapies if indicated.  Patient questioning return to prior activities including driving.  Cognitively, patient would not be safe to return to driving but she was also advised regarding [place] law of seizure-free 6 months prior to returning.  She is advised not to drive at this time.    Follow up in 3 months or call earlier if needed   Greater than 50% of  time during this 45 minute visit was spent on counseling, explanation of diagnosis of right MCA stroke, reviewing risk factor management of atrial fibrillation, HTN, HLD, DM, post stroke seizures, planning of further management along with potential future management, and discussion with patient and family answering all questions.    Frann Rider, AGNP-BC  [facility] [address] [place], [place] [address]  Phone [phone] Fax [phone] Note:  This document was prepared with digital dictation and possible smart phrase technology. Any transcriptional errors that result from this process are unintentional.

## 2019-08-18 NOTE — Patient Instructions (Signed)
Continue aspirin 81 mg daily and Eliquis (apixaban) daily  and Lipitor for secondary stroke prevention  Continue to follow up with PCP regarding cholesterol, blood pressure and diabetes management   Continue to follow with cardiology for atrial fibrillation management  Continue valproic acid 250 mg 3 times daily for seizure prevention -we will check level today and you will be called with any abnormal levels  Continue to work with therapies and likely transition to outpatient therapies once home health therapy completed  Continue to monitor blood pressure at home  Maintain strict control of hypertension with blood pressure goal below 130/90, diabetes with hemoglobin A1c goal below 6.5% and cholesterol with LDL cholesterol (bad cholesterol) goal below 70 mg/dL. I also advised the patient to eat a healthy diet with plenty of whole grains, cereals, fruits and vegetables, exercise regularly and maintain ideal body weight.  Followup in the future with me in 3 months or call earlier if needed       Thank you for coming to see [place] at [facility]. I hope we have been able to provide you high quality care today.  You may receive a patient satisfaction survey over the next few weeks. We would appreciate your feedback and comments so that we may continue to improve ourselves and the health of our patients.

## 2019-08-19 ENCOUNTER — Telehealth: Payer: Self-pay | Admitting: *Deleted

## 2019-08-19 DIAGNOSIS — E1165 Type 2 diabetes mellitus with hyperglycemia: Secondary | ICD-10-CM | POA: Diagnosis not present

## 2019-08-19 DIAGNOSIS — I69328 Other speech and language deficits following cerebral infarction: Secondary | ICD-10-CM | POA: Diagnosis not present

## 2019-08-19 DIAGNOSIS — I480 Paroxysmal atrial fibrillation: Secondary | ICD-10-CM | POA: Diagnosis not present

## 2019-08-19 DIAGNOSIS — I10 Essential (primary) hypertension: Secondary | ICD-10-CM | POA: Diagnosis not present

## 2019-08-19 DIAGNOSIS — I69354 Hemiplegia and hemiparesis following cerebral infarction affecting left non-dominant side: Secondary | ICD-10-CM | POA: Diagnosis not present

## 2019-08-19 DIAGNOSIS — E1151 Type 2 diabetes mellitus with diabetic peripheral angiopathy without gangrene: Secondary | ICD-10-CM | POA: Diagnosis not present

## 2019-08-19 LAB — VALPROIC ACID LEVEL: Valproic Acid Lvl: 78 ug/mL (ref 50–100)

## 2019-08-19 NOTE — Telephone Encounter (Signed)
I called and spoke to [place], daughter in law (ok per DPR) that her VPA level was good level.  Continue current regimen.  She will let her know and verbalized understanding.

## 2019-08-19 NOTE — Telephone Encounter (Signed)
-----   Message from Frann Rider, NP sent at 08/19/2019 10:07 AM EDT ----- Please advise patient/family that recent valproic acid level stable and recommended continuation of current regimen at this time

## 2019-08-23 DIAGNOSIS — I69328 Other speech and language deficits following cerebral infarction: Secondary | ICD-10-CM | POA: Diagnosis not present

## 2019-08-23 DIAGNOSIS — I69354 Hemiplegia and hemiparesis following cerebral infarction affecting left non-dominant side: Secondary | ICD-10-CM | POA: Diagnosis not present

## 2019-08-23 DIAGNOSIS — E1151 Type 2 diabetes mellitus with diabetic peripheral angiopathy without gangrene: Secondary | ICD-10-CM | POA: Diagnosis not present

## 2019-08-23 DIAGNOSIS — E1165 Type 2 diabetes mellitus with hyperglycemia: Secondary | ICD-10-CM | POA: Diagnosis not present

## 2019-08-23 DIAGNOSIS — I10 Essential (primary) hypertension: Secondary | ICD-10-CM | POA: Diagnosis not present

## 2019-08-23 DIAGNOSIS — I480 Paroxysmal atrial fibrillation: Secondary | ICD-10-CM | POA: Diagnosis not present

## 2019-08-25 DIAGNOSIS — I10 Essential (primary) hypertension: Secondary | ICD-10-CM | POA: Diagnosis not present

## 2019-08-25 DIAGNOSIS — I69328 Other speech and language deficits following cerebral infarction: Secondary | ICD-10-CM | POA: Diagnosis not present

## 2019-08-25 DIAGNOSIS — I69354 Hemiplegia and hemiparesis following cerebral infarction affecting left non-dominant side: Secondary | ICD-10-CM | POA: Diagnosis not present

## 2019-08-25 DIAGNOSIS — E1151 Type 2 diabetes mellitus with diabetic peripheral angiopathy without gangrene: Secondary | ICD-10-CM | POA: Diagnosis not present

## 2019-08-25 DIAGNOSIS — I480 Paroxysmal atrial fibrillation: Secondary | ICD-10-CM | POA: Diagnosis not present

## 2019-08-25 DIAGNOSIS — E1165 Type 2 diabetes mellitus with hyperglycemia: Secondary | ICD-10-CM | POA: Diagnosis not present

## 2019-08-30 DIAGNOSIS — I480 Paroxysmal atrial fibrillation: Secondary | ICD-10-CM | POA: Diagnosis not present

## 2019-08-30 DIAGNOSIS — I69328 Other speech and language deficits following cerebral infarction: Secondary | ICD-10-CM | POA: Diagnosis not present

## 2019-08-30 DIAGNOSIS — E1151 Type 2 diabetes mellitus with diabetic peripheral angiopathy without gangrene: Secondary | ICD-10-CM | POA: Diagnosis not present

## 2019-08-30 DIAGNOSIS — E1165 Type 2 diabetes mellitus with hyperglycemia: Secondary | ICD-10-CM | POA: Diagnosis not present

## 2019-08-30 DIAGNOSIS — I10 Essential (primary) hypertension: Secondary | ICD-10-CM | POA: Diagnosis not present

## 2019-08-30 DIAGNOSIS — I69354 Hemiplegia and hemiparesis following cerebral infarction affecting left non-dominant side: Secondary | ICD-10-CM | POA: Diagnosis not present

## 2019-08-31 DIAGNOSIS — Z23 Encounter for immunization: Secondary | ICD-10-CM | POA: Diagnosis not present

## 2019-09-01 DIAGNOSIS — E1151 Type 2 diabetes mellitus with diabetic peripheral angiopathy without gangrene: Secondary | ICD-10-CM | POA: Diagnosis not present

## 2019-09-01 DIAGNOSIS — I69328 Other speech and language deficits following cerebral infarction: Secondary | ICD-10-CM | POA: Diagnosis not present

## 2019-09-01 DIAGNOSIS — E1165 Type 2 diabetes mellitus with hyperglycemia: Secondary | ICD-10-CM | POA: Diagnosis not present

## 2019-09-01 DIAGNOSIS — I69354 Hemiplegia and hemiparesis following cerebral infarction affecting left non-dominant side: Secondary | ICD-10-CM | POA: Diagnosis not present

## 2019-09-01 DIAGNOSIS — I480 Paroxysmal atrial fibrillation: Secondary | ICD-10-CM | POA: Diagnosis not present

## 2019-09-01 DIAGNOSIS — I10 Essential (primary) hypertension: Secondary | ICD-10-CM | POA: Diagnosis not present

## 2019-09-06 DIAGNOSIS — I480 Paroxysmal atrial fibrillation: Secondary | ICD-10-CM | POA: Diagnosis not present

## 2019-09-06 DIAGNOSIS — I69354 Hemiplegia and hemiparesis following cerebral infarction affecting left non-dominant side: Secondary | ICD-10-CM | POA: Diagnosis not present

## 2019-09-06 DIAGNOSIS — I69328 Other speech and language deficits following cerebral infarction: Secondary | ICD-10-CM | POA: Diagnosis not present

## 2019-09-06 DIAGNOSIS — E1165 Type 2 diabetes mellitus with hyperglycemia: Secondary | ICD-10-CM | POA: Diagnosis not present

## 2019-09-06 DIAGNOSIS — E1151 Type 2 diabetes mellitus with diabetic peripheral angiopathy without gangrene: Secondary | ICD-10-CM | POA: Diagnosis not present

## 2019-09-06 DIAGNOSIS — I10 Essential (primary) hypertension: Secondary | ICD-10-CM | POA: Diagnosis not present

## 2019-09-08 DIAGNOSIS — E1151 Type 2 diabetes mellitus with diabetic peripheral angiopathy without gangrene: Secondary | ICD-10-CM | POA: Diagnosis not present

## 2019-09-08 DIAGNOSIS — E1165 Type 2 diabetes mellitus with hyperglycemia: Secondary | ICD-10-CM | POA: Diagnosis not present

## 2019-09-08 DIAGNOSIS — I69354 Hemiplegia and hemiparesis following cerebral infarction affecting left non-dominant side: Secondary | ICD-10-CM | POA: Diagnosis not present

## 2019-09-08 DIAGNOSIS — I480 Paroxysmal atrial fibrillation: Secondary | ICD-10-CM | POA: Diagnosis not present

## 2019-09-08 DIAGNOSIS — I10 Essential (primary) hypertension: Secondary | ICD-10-CM | POA: Diagnosis not present

## 2019-09-08 DIAGNOSIS — I69328 Other speech and language deficits following cerebral infarction: Secondary | ICD-10-CM | POA: Diagnosis not present

## 2019-09-13 ENCOUNTER — Other Ambulatory Visit: Payer: Self-pay

## 2019-09-13 ENCOUNTER — Ambulatory Visit (INDEPENDENT_AMBULATORY_CARE_PROVIDER_SITE_OTHER): Payer: Medicare Other | Admitting: Podiatry

## 2019-09-13 DIAGNOSIS — E0843 Diabetes mellitus due to underlying condition with diabetic autonomic (poly)neuropathy: Secondary | ICD-10-CM

## 2019-09-13 DIAGNOSIS — M79676 Pain in unspecified toe(s): Secondary | ICD-10-CM

## 2019-09-13 DIAGNOSIS — B351 Tinea unguium: Secondary | ICD-10-CM | POA: Diagnosis not present

## 2019-09-13 DIAGNOSIS — Z9181 History of falling: Secondary | ICD-10-CM | POA: Diagnosis not present

## 2019-09-13 DIAGNOSIS — Z8673 Personal history of transient ischemic attack (TIA), and cerebral infarction without residual deficits: Secondary | ICD-10-CM | POA: Diagnosis not present

## 2019-09-13 DIAGNOSIS — M76821 Posterior tibial tendinitis, right leg: Secondary | ICD-10-CM

## 2019-09-15 DIAGNOSIS — I10 Essential (primary) hypertension: Secondary | ICD-10-CM | POA: Diagnosis not present

## 2019-09-15 DIAGNOSIS — R21 Rash and other nonspecific skin eruption: Secondary | ICD-10-CM | POA: Diagnosis not present

## 2019-09-15 DIAGNOSIS — B029 Zoster without complications: Secondary | ICD-10-CM | POA: Diagnosis not present

## 2019-09-15 NOTE — Progress Notes (Signed)
SUBJECTIVE Patient presents to office today complaining of elongated, thickened nails that cause pain while ambulating in shoes. She is unable to trim her own nails.  She also complains of medial right ankle pain that began shortly after her stroke. She states the ankle seems to be 'dragging'. She has not had any treatment for the symptoms. Walking makes it worse. Patient is here for further evaluation and treatment.  Past Medical History:  Diagnosis Date  . Acute osteomyelitis ([place]) 08/25/2018   Right toe  . Anemia   . Atrial fibrillation ([place])   . Carpal tunnel syndrome, bilateral   . Cellulitis 08/25/2018   Right foot  . Cervical lymphadenopathy   . Compression fracture of fifth lumbar vertebra (HCC)    to T 11  . CVA (cerebral vascular accident) ([place]) 06/26/2019  . DDD (degenerative disc disease), lumbar   . DJD (degenerative joint disease)   . DOE (dyspnea on exertion)   . Dysphagia   . Dyspnea    with exertion  . Dysrhythmia    hx brief AF post op hip surg  . Esophageal dysmotility 01/31/2014   noted barium  . GERD (gastroesophageal reflux disease)   . Hematoma    right side after stent placement  . History of hiatal hernia 01/31/2014   Small, noted on Barium  . Hyperlipidemia   . Hypertension   . Jaundice   . Low back pain   . MRSA (methicillin resistant Staphylococcus aureus)   . Neuropathy   . Nocturia   . OA (osteoarthritis)   . Osteoporosis   . Ovarian cyst   . PAD (peripheral artery disease) ([place])   . Peripheral vascular disease ([place])   . Pleural effusion, left 04/17/2017   Small  . RA (rheumatoid arthritis) ([place])   . Spinal stenosis   . Toe ulcer, right, with necrosis of bone ([place]) 08/25/2018  . Type 2 diabetes mellitus ([place])   . Unsteady gait   . Urinary incontinence   . Uterine polyp   . Varicose vein of leg    Bilateral    OBJECTIVE General Patient is awake, alert, and oriented x 3 and in no acute distress. Derm Skin is dry and supple  bilateral. Negative open lesions or macerations. Remaining integument unremarkable. Nails are tender, long, thickened and dystrophic with subungual debris, consistent with onychomycosis, bilateral. No signs of infection noted. Vasc  DP and PT pedal pulses palpable bilaterally. Temperature gradient within normal limits.  Neuro Epicritic and protective threshold sensation grossly intact bilaterally.  Musculoskeletal Exam Pain on palpation noted to the posterior tibial tendon of the right foot with medial longitudinal arch collapse noted. No symptomatic pedal deformities noted bilateral. Muscle weakness noted to bilateral lower extremities.   ASSESSMENT 1. Onychodystrophic nails bilateral with hyperkeratosis of nails.  2. Onychomycosis of nail due to dermatophyte bilateral 3. H/o toe amputations bilateral  4. H/o recent stroke 5. H/o recent falls   PLAN OF CARE 1. Patient evaluated today.  2. Instructed to maintain good pedal hygiene and foot care.  3. Mechanical debridement of nails 1-5 bilaterally performed using a nail nipper. Filed with dremel without incident.  4. Appointment with Liliane Channel, Pedorthist, for [employer] brace.  5. Continue wearing DM shoes.  6. Return to clinic as needed.    [name], DPM [facility]  Dr. [name], DPM    [place]  [place], [place] [address]                Office [phone]  Fax [phone]

## 2019-09-20 ENCOUNTER — Other Ambulatory Visit: Payer: Medicare Other | Admitting: Orthotics

## 2019-09-28 ENCOUNTER — Other Ambulatory Visit: Payer: Medicare Other | Admitting: Orthotics

## 2019-10-01 ENCOUNTER — Other Ambulatory Visit: Payer: Self-pay

## 2019-10-01 ENCOUNTER — Encounter
Payer: Medicare Other | Attending: Physical Medicine and Rehabilitation | Admitting: Physical Medicine and Rehabilitation

## 2019-10-01 ENCOUNTER — Encounter: Payer: Self-pay | Admitting: Physical Medicine and Rehabilitation

## 2019-10-01 VITALS — BP 126/85 | HR 72 | Temp 97.2°F | Wt 110.0 lb

## 2019-10-01 DIAGNOSIS — M216X1 Other acquired deformities of right foot: Secondary | ICD-10-CM | POA: Insufficient documentation

## 2019-10-01 DIAGNOSIS — I6389 Other cerebral infarction: Secondary | ICD-10-CM | POA: Diagnosis not present

## 2019-10-01 DIAGNOSIS — B0223 Postherpetic polyneuropathy: Secondary | ICD-10-CM | POA: Insufficient documentation

## 2019-10-01 DIAGNOSIS — R569 Unspecified convulsions: Secondary | ICD-10-CM

## 2019-10-01 DIAGNOSIS — I63431 Cerebral infarction due to embolism of right posterior cerebral artery: Secondary | ICD-10-CM | POA: Diagnosis not present

## 2019-10-01 DIAGNOSIS — R413 Other amnesia: Secondary | ICD-10-CM

## 2019-10-01 MED ORDER — LIDOCAINE 5 % EX OINT: 1.0000 "application " | TOPICAL_OINTMENT | CUTANEOUS | 0 refills | Status: AC | PRN

## 2019-10-01 MED ORDER — LIDOCAINE VISCOUS HCL 2 % MT SOLN: 15.0000 mL | OROMUCOSAL | 1 refills | Status: AC | PRN

## 2019-10-01 NOTE — Progress Notes (Signed)
Subjective:    Patient ID: [name], female    DOB: 1928-02-04, 85 y.o.   MRN: [id number]  HPI  Pt is a 85 yr old female with RA, R temporal CVA, and new TIA vs stroke this weekend- s/p multiple knee replacements and L hip replacement. On seizure prophylaxis since also had 2 seizures. CC- CV temporal CVA s/p 2 seizures    Shingles started 10/28- a lot of nerve pain- put baking soda mix on face to help stinging/burning-   Put on Gabapentin- after asking- 300 mg TID- Gabapentin helps, but whole mouth and ear on L is inflamed.   S/P valcyclovir for Shingles  Overall, been progressing since CVAs. Has stopped asking how it all occurred- used to ask every few days.  Walking with RW- done with H/H PT and OT 12 weeks and signed off.    Pain Inventory Average Pain 10 Pain Right Now 10 My pain is tingling and aching  In the last 24 hours, has pain interfered with the following? General activity 10 Relation with others 10 Enjoyment of life 10 What TIME of day is your pain at its worst? morning Sleep (in general) Poor  Pain is worse with: unsure Pain improves with: medication Relief from Meds: 2  Mobility walk with assistance use a walker how many minutes can you walk? 2-3 ability to climb steps?  no do you drive?  no use a wheelchair  Function retired I need assistance with the following:  dressing, bathing, meal prep and household duties  Neuro/Psych bowel control problems depression  Prior Studies Any changes since last visit?  no  Physicians involved in your care Any changes since last visit?  no   Family History  Problem Relation Age of Onset  . Cancer Mother    Social History   Socioeconomic History  . Marital status: Married    Spouse name: Not on file  . Number of children: Not on file  . Years of education: Not on file  . Highest education level: Not on file  Occupational History  . Not on file  Social Needs  . Financial resource  strain: Not on file  . Food insecurity    Worry: Not on file    Inability: Not on file  . Transportation needs    Medical: Patient refused    Non-medical: Patient refused  Tobacco Use  . Smoking status: Never Smoker  . Smokeless tobacco: Never Used  Substance and Sexual Activity  . Alcohol use: Not Currently  . Drug use: No  . Sexual activity: Not on file  Lifestyle  . Physical activity    Days per week: Not on file    Minutes per session: Not on file  . Stress: Not on file  Relationships  . Social Herbalist on phone: Patient refused    Gets together: Patient refused    Attends religious service: Patient refused    Active member of club or organization: Patient refused    Attends meetings of clubs or organizations: Patient refused    Relationship status: Patient refused  Other Topics Concern  . Not on file  Social History Narrative  . Not on file   Past Surgical History:  Procedure Laterality Date  . AMPUTATION Right 02/04/2013   Procedure: [name] ;  Surgeon: Wylene Simmer, MD;  Location: [facility];  Service: Orthopedics;  Laterality: Right;  . AMPUTATION TOE Right 09/03/2018   Procedure: AMPUTATION  3RD RIGHT TOE INTERPHALANGEAL;  Surgeon: [name], DPM;  Location: [facility];  Service: Podiatry;  Laterality: Right;  . APPENDECTOMY    . COLONOSCOPY    . EYE SURGERY     both cataracts  . FEMORAL ARTERY EXPLORATION Right 04/12/2017   Procedure: EVACUATION HEMATOMA RIGHT GROIN WITH FEMORAL ARTERY REPAIR;  Surgeon: Rosetta Posner, MD;  Location: [facility];  Service: Vascular;  Laterality: Right;  . INCISION AND DRAINAGE  2011   left hip inf-hemovac  . IR ANGIOGRAM EXTREMITY LEFT  03/25/2017  . IR ANGIOGRAM EXTREMITY LEFT  04/08/2017  . IR ANGIOGRAM SELECTIVE EACH ADDITIONAL VESSEL  03/25/2017  . IR FEM POP ART STENT INC PTA MOD SED  03/25/2017  . IR INFUSION THROMBOL ARTERIAL INITIAL (MS)  04/08/2017  . IR RADIOLOGIST EVAL & MGMT  02/26/2017   . IR RADIOLOGIST EVAL & MGMT  04/03/2017  . IR RADIOLOGIST EVAL & MGMT  08/20/2017  . IR RADIOLOGIST EVAL & MGMT  01/22/2018  . IR RADIOLOGIST EVAL & MGMT  01/13/2019  . IR THROMB F/U EVAL ART/VEN FINAL DAY (MS)  04/09/2017  . IR TIB-PERO ART PTA MOD SED  03/25/2017  . IR US GUIDE VASC ACCESS RIGHT  03/25/2017  . IR US GUIDE VASC ACCESS RIGHT  04/08/2017  . KNEE ARTHROSCOPY  1995   left  . NECK MASS EXCISION    . TONSILLECTOMY    . TOTAL HIP ARTHROPLASTY  2006   left-fx  . TOTAL KNEE ARTHROPLASTY  [id number]   left  . TOTAL KNEE ARTHROPLASTY  2009   right   Past Medical History:  Diagnosis Date  . Acute osteomyelitis ([place]) 08/25/2018   Right toe  . Anemia   . Atrial fibrillation ([place])   . Carpal tunnel syndrome, bilateral   . Cellulitis 08/25/2018   Right foot  . Cervical lymphadenopathy   . Compression fracture of fifth lumbar vertebra (HCC)    to T 11  . CVA (cerebral vascular accident) ([place]) 06/26/2019  . DDD (degenerative disc disease), lumbar   . DJD (degenerative joint disease)   . DOE (dyspnea on exertion)   . Dysphagia   . Dyspnea    with exertion  . Dysrhythmia    hx brief AF post op hip surg  . Esophageal dysmotility 01/31/2014   noted barium  . GERD (gastroesophageal reflux disease)   . Hematoma    right side after stent placement  . History of hiatal hernia 01/31/2014   Small, noted on Barium  . Hyperlipidemia   . Hypertension   . Jaundice   . Low back pain   . MRSA (methicillin resistant Staphylococcus aureus)   . Neuropathy   . Nocturia   . OA (osteoarthritis)   . Osteoporosis   . Ovarian cyst   . PAD (peripheral artery disease) ([place])   . Peripheral vascular disease ([place])   . Pleural effusion, left 04/17/2017   Small  . RA (rheumatoid arthritis) ([place])   . Spinal stenosis   . Toe ulcer, right, with necrosis of bone ([place]) 08/25/2018  . Type 2 diabetes mellitus ([place])   . Unsteady gait   . Urinary incontinence   . Uterine polyp   . Varicose vein of  leg    Bilateral   BP 126/85   Pulse 72   Temp (!) 97.2 F (36.2 C)   Wt 110 lb (49.9 kg)   SpO2 99%   BMI 19.49 kg/m   Opioid Risk Score:  Fall Risk Score:  `1  Depression screen PHQ 2/9  Depression screen [facility] 2/9 08/18/2019 08/31/2018  Decreased Interest 0 0  Down, Depressed, Hopeless 0 0  PHQ - 2 Score 0 0    Review of Systems  Constitutional: Positive for unexpected weight change.  Eyes: Negative.   Respiratory: Negative.   Cardiovascular: Negative.   Gastrointestinal: Positive for diarrhea.  Endocrine: Negative.   Genitourinary: Negative.   Musculoskeletal: Positive for gait problem.  Skin: Positive for rash.  Allergic/Immunologic: Negative.   Psychiatric/Behavioral: Positive for dysphoric mood.  All other systems reviewed and are negative. getting a brace on R foot- dragging it Diarrhea is slacking; improving    Objective:   Physical Exam  Awake, alert, appropriate, accompanied by daughter in law, NAD Over-pronates RIGHT foot with gait No hoffman's B/L No clonus B/L No increased tone in UEs and LEs Fell/plopped down with sitting Healed scabs on L side of face, esp next to nose from shingles that's almost healed.      Assessment & Plan:  Pt is a 85 yr old female with RA, R temporal CVA, and new TIA vs stroke this weekend- s/p multiple knee replacements and L hip replacement. On seizure prophylaxis since also had 2 seizures. Over-pronation of R foot. New onset shingles- healing/sp Valcyclovir.  1. Lidocaine ointment- and viscous lidocaine q3-4 hours as needed- (ointment q4 hours prn; viscous gel q3 hours prn)  2. Con't Gabapentin at current dose  3. Wait on any opiates per daughter's in law's concerns  4. Needs a brace- for over pronation of R foot- could also look into shoes for over-pronation- ask podiatry about them  5. No spasticity- and it's been 3 months, so likely that she won't form any spasticity.   6. Can tell that she feels weaker- since  shingles- con't Home exercise program.   7. F/U as needed  I spent a total of 25 minutes on visit- more than 15 minutes discussing shingles and plan for treatment and gait/HEP as discussed above.

## 2019-10-01 NOTE — Patient Instructions (Signed)
Pt is a 85 yr old female with RA, R temporal CVA, and new TIA vs stroke this weekend- s/p multiple knee replacements and L hip replacement. On seizure prophylaxis since also had 2 seizures. Over-pronation of R foot. New onset shingles- healing/sp Valcyclovir.  1. Lidocaine ointment- and viscous lidocaine q3-4 hours as needed- (ointment q4 hours prn; viscous gel q3 hours prn)  2. Con't Gabapentin at current dose  3. Wait on any opiates per daughter's in law's concerns  4. Needs a brace- for over pronation of R foot- could also look into shoes for over-pronation- ask podiatry about them  5. No spasticity- and it's been 3 months, so likely that she won't form any spasticity.   6. Can tell that she feels weaker- since shingles- con't Home exercise program.   7. F/U as needed

## 2019-10-05 ENCOUNTER — Telehealth: Payer: Self-pay | Admitting: *Deleted

## 2019-10-05 NOTE — Telephone Encounter (Signed)
Prior authorization submitted to covermymeds for lidocaine 5% ointment  Approved 09/03/2019-09/02/2020

## 2019-10-12 ENCOUNTER — Other Ambulatory Visit: Payer: Self-pay

## 2019-10-12 ENCOUNTER — Emergency Department (HOSPITAL_COMMUNITY)
Admission: EM | Admit: 2019-10-12 | Discharge: 2019-10-12 | Disposition: A | Discharge status: Home / Self Care | Payer: Medicare Other | Attending: Emergency Medicine | Admitting: Emergency Medicine

## 2019-10-12 ENCOUNTER — Encounter (HOSPITAL_COMMUNITY): Payer: Self-pay

## 2019-10-12 DIAGNOSIS — Z7982 Long term (current) use of aspirin: Secondary | ICD-10-CM | POA: Diagnosis not present

## 2019-10-12 DIAGNOSIS — Z7984 Long term (current) use of oral hypoglycemic drugs: Secondary | ICD-10-CM | POA: Insufficient documentation

## 2019-10-12 DIAGNOSIS — E119 Type 2 diabetes mellitus without complications: Secondary | ICD-10-CM | POA: Insufficient documentation

## 2019-10-12 DIAGNOSIS — R531 Weakness: Secondary | ICD-10-CM | POA: Diagnosis present

## 2019-10-12 DIAGNOSIS — Z96642 Presence of left artificial hip joint: Secondary | ICD-10-CM | POA: Insufficient documentation

## 2019-10-12 DIAGNOSIS — N390 Urinary tract infection, site not specified: Secondary | ICD-10-CM | POA: Diagnosis not present

## 2019-10-12 DIAGNOSIS — I10 Essential (primary) hypertension: Secondary | ICD-10-CM | POA: Diagnosis not present

## 2019-10-12 DIAGNOSIS — Z79899 Other long term (current) drug therapy: Secondary | ICD-10-CM | POA: Diagnosis not present

## 2019-10-12 DIAGNOSIS — E860 Dehydration: Secondary | ICD-10-CM | POA: Diagnosis not present

## 2019-10-12 DIAGNOSIS — Z96653 Presence of artificial knee joint, bilateral: Secondary | ICD-10-CM | POA: Diagnosis not present

## 2019-10-12 LAB — CBC
HCT: 44.5 % (ref 36.0–46.0)
Hemoglobin: 14.5 g/dL (ref 12.0–15.0)
MCH: 34.3 pg — ABNORMAL HIGH (ref 26.0–34.0)
MCHC: 32.6 g/dL (ref 30.0–36.0)
MCV: 105.2 fL — ABNORMAL HIGH (ref 80.0–100.0)
Platelets: 163 10*3/uL (ref 150–400)
RBC: 4.23 MIL/uL (ref 3.87–5.11)
RDW: 14.8 % (ref 11.5–15.5)
WBC: 9.3 10*3/uL (ref 4.0–10.5)
nRBC: 0 % (ref 0.0–0.2)

## 2019-10-12 LAB — CBG MONITORING, ED: Glucose-Capillary: 176 mg/dL — ABNORMAL HIGH (ref 70–99)

## 2019-10-12 LAB — COMPREHENSIVE METABOLIC PANEL
ALT: 21 U/L (ref 0–44)
AST: 34 U/L (ref 15–41)
Albumin: 3.2 g/dL — ABNORMAL LOW (ref 3.5–5.0)
Alkaline Phosphatase: 71 U/L (ref 38–126)
Anion gap: 15 (ref 5–15)
BUN: 21 mg/dL (ref 8–23)
CO2: 24 mmol/L (ref 22–32)
Calcium: 9.2 mg/dL (ref 8.9–10.3)
Chloride: 103 mmol/L (ref 98–111)
Creatinine, Ser: 1.14 mg/dL — ABNORMAL HIGH (ref 0.44–1.00)
GFR calc Af Amer: 49 mL/min — ABNORMAL LOW (ref >60)
GFR calc non Af Amer: 43 mL/min — ABNORMAL LOW (ref >60)
Glucose, Bld: 170 mg/dL — ABNORMAL HIGH (ref 70–99)
Potassium: 4.3 mmol/L (ref 3.5–5.1)
Sodium: 142 mmol/L (ref 135–145)
Total Bilirubin: 1.6 mg/dL — ABNORMAL HIGH (ref 0.3–1.2)
Total Protein: 6.4 g/dL — ABNORMAL LOW (ref 6.5–8.1)

## 2019-10-12 LAB — URINALYSIS, ROUTINE W REFLEX MICROSCOPIC
Bilirubin Urine: NEGATIVE
Glucose, UA: NEGATIVE mg/dL
Hgb urine dipstick: NEGATIVE
Ketones, ur: 5 mg/dL — AB
Nitrite: NEGATIVE
Protein, ur: NEGATIVE mg/dL
Specific Gravity, Urine: 1.016 (ref 1.005–1.030)
pH: 5 (ref 5.0–8.0)

## 2019-10-12 MED ORDER — CIPROFLOXACIN IN D5W 400 MG/200ML IV SOLN
400.0000 mg | Freq: Once | INTRAVENOUS | Status: AC
  Administered 2019-10-12: 20:00:00 400 mg via INTRAVENOUS
  Filled 2019-10-12: qty 200

## 2019-10-12 MED ORDER — CIPROFLOXACIN HCL 250 MG PO TABS: 250.0000 mg | ORAL_TABLET | Freq: Two times a day (BID) | ORAL | 0 refills | Status: AC

## 2019-10-12 MED ORDER — LACTATED RINGERS IV BOLUS
1000.0000 mL | Freq: Once | INTRAVENOUS | Status: AC
  Administered 2019-10-12: 1000 mL via INTRAVENOUS

## 2019-10-12 MED ORDER — SODIUM CHLORIDE 0.9% FLUSH: 3.0000 mL | Freq: Once | INTRAVENOUS | Status: DC

## 2019-10-12 NOTE — ED Notes (Signed)
Patient verbalizes understanding of discharge instructions. Opportunity for questioning and answers were provided. Armband removed by staff, pt discharged from ED.  

## 2019-10-12 NOTE — ED Triage Notes (Signed)
Pt accompanied by family member who reports for the past few days pt has had diarrhea and is not eating. Pt had stroke in August of this year. Pt alert, ill appearing in triage.

## 2019-10-14 LAB — URINE CULTURE

## 2019-10-16 NOTE — ED Provider Notes (Signed)
[name] Provider Note   CSN: [id number] Arrival date & time: 10/12/19  1339     History   Chief Complaint Chief Complaint  Patient presents with  . Diarrhea  . Anorexia  . Weakness    HPI [name] is a 85 y.o. female.  HPI   85 year old female with generalized weakness and diarrhea.  Onset 2 to 3 days ago.  Persistent since then.  Poor appetite.  Is been eating and drinking very little.  No fevers.  No specific urinary complaints.  No blood in her stool.  No sick contacts that she is aware of.  Past Medical History:  Diagnosis Date  . Acute osteomyelitis ([place]) 08/25/2018   Right toe  . Anemia   . Atrial fibrillation ([place])   . Carpal tunnel syndrome, bilateral   . Cellulitis 08/25/2018   Right foot  . Cervical lymphadenopathy   . Compression fracture of fifth lumbar vertebra (HCC)    to T 11  . CVA (cerebral vascular accident) ([place]) 06/26/2019  . DDD (degenerative disc disease), lumbar   . DJD (degenerative joint disease)   . DOE (dyspnea on exertion)   . Dysphagia   . Dyspnea    with exertion  . Dysrhythmia    hx brief AF post op hip surg  . Esophageal dysmotility 01/31/2014   noted barium  . GERD (gastroesophageal reflux disease)   . Hematoma    right side after stent placement  . History of hiatal hernia 01/31/2014   Small, noted on Barium  . Hyperlipidemia   . Hypertension   . Jaundice   . Low back pain   . MRSA (methicillin resistant Staphylococcus aureus)   . Neuropathy   . Nocturia   . OA (osteoarthritis)   . Osteoporosis   . Ovarian cyst   . PAD (peripheral artery disease) ([place])   . Peripheral vascular disease ([place])   . Pleural effusion, left 04/17/2017   Small  . RA (rheumatoid arthritis) ([place])   . Spinal stenosis   . Toe ulcer, right, with necrosis of bone ([place]) 08/25/2018  . Type 2 diabetes mellitus ([place])   . Unsteady gait   . Urinary incontinence   . Uterine polyp   . Varicose vein of  leg    Bilateral    Patient Active Problem List   Diagnosis Date Noted  . Shingles (herpes zoster) polyneuropathy 10/01/2019  . Pronation deformity of right foot 10/01/2019  . Memory loss   . Seizure ([place])   . Leukocytosis   . Controlled type 2 diabetes mellitus with hyperglycemia, without long-term current use of insulin ([place])   . PAF (paroxysmal atrial fibrillation) ([place])   . 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[place]) 06/28/2019  . CVA (cerebral vascular accident) ([place]) 06/27/2019  . TIA (transient ischemic attack) 06/26/2019  . Hyperlipidemia   . Type 2 diabetes mellitus ([place])   . Degeneration of lumbar intervertebral disc 12/11/2017  . Cough   . Hematoma   . Groin swelling 04/12/2017  . Hematoma of groin 04/12/2017  . Critical lower limb ischemia 04/08/2017  . Long term (current) use of anticoagulants [Z79.01] 12/13/2015  . Closed dislocation of tarsometatarsal joint 10/05/2015  . ULCER OF OTHER PART OF FOOT 06/06/2010  . Essential hypertension 03/26/2010  . ATRIAL FIBRILLATION 03/26/2010  . CUTANEOUS ERUPTIONS, DRUG-INDUCED 03/26/2010  . DIARRHEA 03/26/2010  . GERD 03/20/2010  . UNSPEC LOCAL INFECTION SKIN&SUBCUTANEOUS TISSUE 03/20/2010  . OSTEOARTHRITIS 03/20/2010  . ARTHRITIS, SEPTIC 03/14/2010  .  OTH COMPLICATIONS DUE INTERNAL JOINT PROSTHESIS 02/22/2010    Past Surgical History:  Procedure Laterality Date  . AMPUTATION Right 02/04/2013   Procedure: [name] ;  Surgeon: Wylene Simmer, MD;  Location: [facility];  Service: Orthopedics;  Laterality: Right;  . AMPUTATION TOE Right 09/03/2018   Procedure: AMPUTATION 3RD RIGHT TOE INTERPHALANGEAL;  Surgeon: [name], DPM;  Location[facility];  Service: Podiatry;  Laterality: Right;  . APPENDECTOMY    . COLONOSCOPY    . EYE SURGERY     both cataracts  . FEMORAL ARTERY EXPLORATION Right 04/12/2017   Procedure: EVACUATION HEMATOMA RIGHT GROIN WITH FEMORAL ARTERY REPAIR;  Surgeon: Rosetta Posner,  MD;  Location: [facility];  Service: Vascular;  Laterality: Right;  . INCISION AND DRAINAGE  2011   left hip inf-hemovac  . IR ANGIOGRAM EXTREMITY LEFT  03/25/2017  . IR ANGIOGRAM EXTREMITY LEFT  04/08/2017  . IR ANGIOGRAM SELECTIVE EACH ADDITIONAL VESSEL  03/25/2017  . IR FEM POP ART STENT INC PTA MOD SED  03/25/2017  . IR INFUSION THROMBOL ARTERIAL INITIAL (MS)  04/08/2017  . IR RADIOLOGIST EVAL & MGMT  02/26/2017  . IR RADIOLOGIST EVAL & MGMT  04/03/2017  . IR RADIOLOGIST EVAL & MGMT  08/20/2017  . IR RADIOLOGIST EVAL & MGMT  01/22/2018  . IR RADIOLOGIST EVAL & MGMT  01/13/2019  . IR THROMB F/U EVAL ART/VEN FINAL DAY (MS)  04/09/2017  . IR TIB-PERO ART PTA MOD SED  03/25/2017  . IR US GUIDE VASC ACCESS RIGHT  03/25/2017  . IR US GUIDE VASC ACCESS RIGHT  04/08/2017  . KNEE ARTHROSCOPY  1995   left  . NECK MASS EXCISION    . TONSILLECTOMY    . TOTAL HIP ARTHROPLASTY  2006   left-fx  . TOTAL KNEE ARTHROPLASTY  [id number]   left  . TOTAL KNEE ARTHROPLASTY  2009   right     OB History   No obstetric history on file.      Home Medications    Prior to Admission medications   Medication Sig Start Date End Date Taking? Authorizing Provider  acetaminophen (TYLENOL) 325 MG tablet Take 650 mg by mouth daily with breakfast.    Yes [provider]  amLODipine (NORVASC) 10 MG tablet Take 1 tablet (10 mg total) by mouth daily. 07/13/19  Yes Angiulli, Lavon Paganini, PA-C  apixaban (ELIQUIS) 2.5 MG TABS tablet Take 1 tablet (2.5 mg total) by mouth 2 (two) times daily. 07/12/19  Yes Angiulli, Lavon Paganini, PA-C  aspirin EC 81 MG EC tablet Take 1 tablet (81 mg total) by mouth daily. 07/13/19  Yes Angiulli, Lavon Paganini, PA-C  atorvastatin (LIPITOR) 40 MG tablet Take 1 tablet (40 mg total) by mouth daily at 6 PM. 07/12/19  Yes Angiulli, Lavon Paganini, PA-C  calcium carbonate (OSCAL) 1500 (600 Ca) MG TABS tablet Take 600 mg of elemental calcium by mouth daily with breakfast.    Yes [provider]  ergocalciferol  (VITAMIN D2) 1.25 MG (50000 UT) capsule Take 50,000 Units by mouth every Sunday.    Yes [provider]  ferrous sulfate 325 (65 FE) MG tablet Take 325 mg by mouth daily with breakfast.   Yes [provider]  gabapentin (NEURONTIN) 100 MG capsule Take 100 mg by mouth 3 (three) times daily.   Yes [provider]  lidocaine (XYLOCAINE) 2 % solution Use as directed 15 mLs in the mouth or throat every 3 (three)  hours as needed for mouth pain (from shingles as needed). 10/01/19  Yes Lovorn, Jinny Blossom, MD  lidocaine (XYLOCAINE) 5 % ointment Apply 1 application topically every 4 (four) hours as needed. To face for shingles 10/01/19  Yes Lovorn, Jinny Blossom, MD  metFORMIN (GLUCOPHAGE) 500 MG tablet Take 1 tablet (500 mg total) by mouth 2 (two) times daily with a meal. 07/12/19  Yes Angiulli, Lavon Paganini, PA-C  metoprolol succinate (TOPROL-XL) 25 MG 24 hr tablet Take 1 tablet (25 mg total) by mouth daily with supper. 07/12/19  Yes Angiulli, Lavon Paganini, PA-C  pantoprazole (PROTONIX) 40 MG tablet Take 1 tablet (40 mg total) by mouth daily. 07/12/19  Yes Angiulli, Lavon Paganini, PA-C  vitamin B-12 (CYANOCOBALAMIN) 1000 MCG tablet Take 1,000 mcg by mouth daily with breakfast.    Yes [provider]  ciprofloxacin (CIPRO) 250 MG tablet Take 1 tablet (250 mg total) by mouth every 12 (twelve) hours. 10/12/19   Virgel Manifold, MD    Family History Family History  Problem Relation Age of Onset  . Cancer Mother     Social History Social History   Tobacco Use  . Smoking status: Never Smoker  . Smokeless tobacco: Never Used  Substance Use Topics  . Alcohol use: Not Currently  . Drug use: No     Allergies   Amprenavir, Bactrim [sulfamethoxazole-trimethoprim], Ceftriaxone sodium, Phenergan [promethazine hcl], Sulfonamide derivatives, Tramadol, and Vancomycin hcl   Review of Systems Review of Systems  All systems reviewed and negative, other than as noted in HPI.  Physical Exam Updated  Vital Signs BP (!) 140/96   Pulse 74   Resp 20   SpO2 96%   Physical Exam Vitals signs and nursing note reviewed.  Constitutional:      General: She is not in acute distress.    Appearance: She is well-developed.  HENT:     Head: Normocephalic and atraumatic.  Eyes:     General:        Right eye: No discharge.        Left eye: No discharge.     Conjunctiva/sclera: Conjunctivae normal.  Neck:     Musculoskeletal: Neck supple.  Cardiovascular:     Rate and Rhythm: Normal rate and regular rhythm.     Heart sounds: Normal heart sounds. No murmur. No friction rub. No gallop.   Pulmonary:     Effort: Pulmonary effort is normal. No respiratory distress.     Breath sounds: Normal breath sounds.  Abdominal:     General: There is no distension.     Palpations: Abdomen is soft.     Tenderness: There is no abdominal tenderness.  Musculoskeletal:        General: No tenderness.  Skin:    General: Skin is warm and dry.  Neurological:     Mental Status: She is alert.  Psychiatric:        Behavior: Behavior normal.        Thought Content: Thought content normal.      ED Treatments / Results  Labs (all labs ordered are listed, but only abnormal results are displayed) Labs Reviewed  URINE CULTURE - Abnormal; Notable for the following components:      Result Value   Culture MULTIPLE SPECIES PRESENT, SUGGEST RECOLLECTION (*)    All other components within normal limits  CBC - Abnormal; Notable for the following components:   MCV 105.2 (*)    MCH 34.3 (*)    All other components within normal limits  URINALYSIS, ROUTINE W REFLEX MICROSCOPIC - Abnormal; Notable for the following components:   Ketones, ur 5 (*)    Leukocytes,Ua MODERATE (*)    Bacteria, UA RARE (*)    All other components within normal limits  COMPREHENSIVE METABOLIC PANEL - Abnormal; Notable for the following components:   Glucose, Bld 170 (*)    Creatinine, Ser 1.14 (*)    Total Protein 6.4 (*)    Albumin  3.2 (*)    Total Bilirubin 1.6 (*)    GFR calc non Af Amer 43 (*)    GFR calc Af Amer 49 (*)    All other components within normal limits  CBG MONITORING, ED - Abnormal; Notable for the following components:   Glucose-Capillary 176 (*)    All other components within normal limits    EKG None  Radiology No results found.  Procedures Procedures (including critical care time)  Medications Ordered in ED Medications  lactated ringers bolus 1,000 mL (0 mLs Intravenous Stopped 10/12/19 2000)  ciprofloxacin (CIPRO) IVPB 400 mg (0 mg Intravenous Stopped 10/12/19 2136)     Initial Impression / Assessment and Plan / ED Course  I have reviewed the triage vital signs and the nursing notes.  Pertinent labs & imaging results that were available during my care of the patient were reviewed by me and considered in my medical decision making (see chart for details).       85 year old female with diarrhea.  Benign abdominal exam.  Maybe some mild dehydration and questionable UTI.  Will place on antibiotics.  Return precautions were discussed.  Encourage fluids.  Outpatient follow-up otherwise. Final Clinical Impressions(s) / ED Diagnoses   Final diagnoses:  Dehydration  Urinary tract infection without hematuria, site unspecified    ED Discharge Orders         Ordered    ciprofloxacin (CIPRO) 250 MG tablet  Every 12 hours     10/12/19 2031           Virgel Manifold, MD 10/17/19 0002

## 2019-10-20 ENCOUNTER — Other Ambulatory Visit: Payer: Self-pay

## 2019-10-20 ENCOUNTER — Ambulatory Visit (INDEPENDENT_AMBULATORY_CARE_PROVIDER_SITE_OTHER): Payer: Medicare Other | Admitting: Podiatry

## 2019-10-20 ENCOUNTER — Other Ambulatory Visit: Payer: Self-pay | Admitting: Podiatry

## 2019-10-20 DIAGNOSIS — E0843 Diabetes mellitus due to underlying condition with diabetic autonomic (poly)neuropathy: Secondary | ICD-10-CM

## 2019-10-20 DIAGNOSIS — S90822A Blister (nonthermal), left foot, initial encounter: Secondary | ICD-10-CM

## 2019-10-20 DIAGNOSIS — L97401 Non-pressure chronic ulcer of unspecified heel and midfoot limited to breakdown of skin: Secondary | ICD-10-CM | POA: Diagnosis not present

## 2019-10-20 DIAGNOSIS — L97512 Non-pressure chronic ulcer of other part of right foot with fat layer exposed: Secondary | ICD-10-CM | POA: Diagnosis not present

## 2019-10-21 DIAGNOSIS — E785 Hyperlipidemia, unspecified: Secondary | ICD-10-CM | POA: Diagnosis not present

## 2019-10-21 DIAGNOSIS — I739 Peripheral vascular disease, unspecified: Secondary | ICD-10-CM | POA: Diagnosis not present

## 2019-10-21 DIAGNOSIS — R269 Unspecified abnormalities of gait and mobility: Secondary | ICD-10-CM | POA: Diagnosis not present

## 2019-10-21 DIAGNOSIS — B029 Zoster without complications: Secondary | ICD-10-CM | POA: Diagnosis not present

## 2019-10-21 DIAGNOSIS — R1312 Dysphagia, oropharyngeal phase: Secondary | ICD-10-CM | POA: Diagnosis not present

## 2019-10-21 DIAGNOSIS — I10 Essential (primary) hypertension: Secondary | ICD-10-CM | POA: Diagnosis not present

## 2019-10-21 DIAGNOSIS — Z8679 Personal history of other diseases of the circulatory system: Secondary | ICD-10-CM | POA: Diagnosis not present

## 2019-10-21 DIAGNOSIS — I480 Paroxysmal atrial fibrillation: Secondary | ICD-10-CM | POA: Diagnosis not present

## 2019-10-21 DIAGNOSIS — E1151 Type 2 diabetes mellitus with diabetic peripheral angiopathy without gangrene: Secondary | ICD-10-CM | POA: Diagnosis not present

## 2019-10-24 NOTE — Progress Notes (Signed)
Subjective: 85 y.o. female presenting to the office today with a chief complaint of an ulceration of the right second toe that has been present for several weeks. She reports associated clear drainage, redness and swelling. She has been covering the area with a bandage for treatment. There are no modifying factors noted.  She also reports a painful lesion to the posterior right heel that appeared one week ago. She reports associated burning, soreness, redness and drainage. She states her shoes rub the area and makes it worse. She has not done anything for treatment. Patient is here for further evaluation and treatment.   Past Medical History:  Diagnosis Date  . Acute osteomyelitis ([place]) 08/25/2018   Right toe  . Anemia   . Atrial fibrillation ([place])   . Carpal tunnel syndrome, bilateral   . Cellulitis 08/25/2018   Right foot  . Cervical lymphadenopathy   . Compression fracture of fifth lumbar vertebra (HCC)    to T 11  . CVA (cerebral vascular accident) ([place]) 06/26/2019  . DDD (degenerative disc disease), lumbar   . DJD (degenerative joint disease)   . DOE (dyspnea on exertion)   . Dysphagia   . Dyspnea    with exertion  . Dysrhythmia    hx brief AF post op hip surg  . Esophageal dysmotility 01/31/2014   noted barium  . GERD (gastroesophageal reflux disease)   . Hematoma    right side after stent placement  . History of hiatal hernia 01/31/2014   Small, noted on Barium  . Hyperlipidemia   . Hypertension   . Jaundice   . Low back pain   . MRSA (methicillin resistant Staphylococcus aureus)   . Neuropathy   . Nocturia   . OA (osteoarthritis)   . Osteoporosis   . Ovarian cyst   . PAD (peripheral artery disease) ([place])   . Peripheral vascular disease ([place])   . Pleural effusion, left 04/17/2017   Small  . RA (rheumatoid arthritis) ([place])   . Spinal stenosis   . Toe ulcer, right, with necrosis of bone ([place]) 08/25/2018  . Type 2 diabetes mellitus ([place])   . Unsteady gait    . Urinary incontinence   . Uterine polyp   . Varicose vein of leg    Bilateral     Objective:  Physical Exam General: Alert and oriented x3 in no acute distress  Dermatology: Wound #1 noted to the right second toe measuring approximately 0.3 x 0.3 x 0.1 cm.   To the above-noted ulceration, there is no eschar. There is a moderate amount of slough, fibrin and necrotic tissue. Granulation tissue and wound base is red. There is no malodor. There is a minimal amount of serosanginous drainage noted. Periwound integrity is intact.  Pre-ulcerative callus lesion noted to the right heel. Skin is warm, dry and supple bilateral lower extremities.   Vascular: Palpable pedal pulses bilaterally. No edema or erythema noted. Capillary refill within normal limits.  Neurological: Epicritic and protective threshold grossly intact bilaterally.   Musculoskeletal Exam: Multiple bilateral toe amputations noted. Range of motion within normal limits bilateral. Muscle strength 5/5 in all groups bilateral.  Assessment: 1. Pre-ulcerative callus lesion noted to the right heel 2. Ulceration of the right 2nd toe  3. H/o multiple bilateral toe amputations    Plan of Care:  1. Patient evaluated 2. Silicone heel pad dispensed.  3. Medically necessary excisional debridement including subcutaneous tissue was performed using a tissue nipper and a chisel blade. Excisional debridement  of all the necrotic nonviable tissue down to healthy bleeding viable tissue was performed with post-debridement measurements same as pre-. 4. The wound was cleansed and dry sterile dressing applied. 5. Continue using Gentamicin cream.  6. Continue wearing DM shoes and insoles.  7. Return to clinic in 4 weeks.    [name], DPM [facility]  Dr. [name], [place]                                        [place], [place] [address]                Office [phone]  Fax [phone]

## 2019-11-17 ENCOUNTER — Ambulatory Visit (INDEPENDENT_AMBULATORY_CARE_PROVIDER_SITE_OTHER): Payer: Medicare Other | Admitting: Podiatry

## 2019-11-17 ENCOUNTER — Other Ambulatory Visit: Payer: Self-pay

## 2019-11-17 DIAGNOSIS — L97412 Non-pressure chronic ulcer of right heel and midfoot with fat layer exposed: Secondary | ICD-10-CM

## 2019-11-17 MED ORDER — GENTAMICIN SULFATE 0.1 % EX CREA: 1.0000 "application " | TOPICAL_CREAM | Freq: Two times a day (BID) | CUTANEOUS | 1 refills | Status: AC

## 2019-11-17 MED ORDER — SANTYL 250 UNIT/GM EX OINT: 1.0000 "application " | TOPICAL_OINTMENT | Freq: Every day | CUTANEOUS | 3 refills | Status: AC

## 2019-11-23 ENCOUNTER — Ambulatory Visit: Payer: Medicare Other | Admitting: Adult Health

## 2019-11-24 NOTE — Progress Notes (Signed)
Subjective: 86 y.o. female presenting to the office today for follow up evaluation of an ulceration and a pre-ulcerative callus lesion of the right foot. She states the ulceration of the right second toe looks much better and has improved. She expresses concern about the pre-ulcerative callus lesion noted to the right heel. She has been using the silicone heel pad and applying Gentamicin cream as directed. There are no modifying factors noted. Patient is here for further evaluation and treatment.   Past Medical History:  Diagnosis Date  . Acute osteomyelitis ([place]) 08/25/2018   Right toe  . Anemia   . Atrial fibrillation ([place])   . Carpal tunnel syndrome, bilateral   . Cellulitis 08/25/2018   Right foot  . Cervical lymphadenopathy   . Compression fracture of fifth lumbar vertebra (HCC)    to T 11  . CVA (cerebral vascular accident) ([place]) 06/26/2019  . DDD (degenerative disc disease), lumbar   . DJD (degenerative joint disease)   . DOE (dyspnea on exertion)   . Dysphagia   . Dyspnea    with exertion  . Dysrhythmia    hx brief AF post op hip surg  . Esophageal dysmotility 01/31/2014   noted barium  . GERD (gastroesophageal reflux disease)   . Hematoma    right side after stent placement  . History of hiatal hernia 01/31/2014   Small, noted on Barium  . Hyperlipidemia   . Hypertension   . Jaundice   . Low back pain   . MRSA (methicillin resistant Staphylococcus aureus)   . Neuropathy   . Nocturia   . OA (osteoarthritis)   . Osteoporosis   . Ovarian cyst   . PAD (peripheral artery disease) ([place])   . Peripheral vascular disease ([place])   . Pleural effusion, left 04/17/2017   Small  . RA (rheumatoid arthritis) ([place])   . Spinal stenosis   . Toe ulcer, right, with necrosis of bone ([place]) 08/25/2018  . Type 2 diabetes mellitus ([place])   . Unsteady gait   . Urinary incontinence   . Uterine polyp   . Varicose vein of leg    Bilateral     Objective:  Physical  Exam General: Alert and oriented x3 in no acute distress  Dermatology: Wound noted to the right 2nd toe has healed. Complete re-epithelialization has occurred. No drainage noted.   Wound #1 noted to the right posterior heel measuring approximately 0.3 x 0.3 x 0.1 cm.   To the above-noted ulceration, there is no eschar. There is a moderate amount of slough, fibrin and necrotic tissue. Granulation tissue and wound base is red. There is no malodor. There is a minimal amount of serosanginous drainage noted. Periwound integrity is intact.  Pre-ulcerative callus lesion noted to the right heel. Skin is warm, dry and supple bilateral lower extremities.   Vascular: Palpable pedal pulses bilaterally. No edema or erythema noted. Capillary refill within normal limits.  Neurological: Epicritic and protective threshold grossly intact bilaterally.   Musculoskeletal Exam: Multiple bilateral toe amputations noted. Range of motion within normal limits bilateral. Muscle strength 5/5 in all groups bilateral.  Assessment: 1. Ulceration noted to the right posterior heel 2. Ulceration of the right 2nd toe - healed  3. H/o multiple bilateral toe amputations    Plan of Care:  1. Patient evaluated 2. Continue wearing DM shoes.  3. Prescription for Santyl for daily use provided to patient.  4. Return to clinic in 3 months.     Dorathy Daft.  Amalia Hailey, DPM [facility]  Dr. [name], [place]                                        [place], [place] [address]                Office [phone]  Fax [phone]

## 2019-11-26 ENCOUNTER — Emergency Department (HOSPITAL_COMMUNITY): Payer: Medicare Other

## 2019-11-26 ENCOUNTER — Emergency Department (HOSPITAL_COMMUNITY)
Admission: EM | Admit: 2019-11-26 | Discharge: 2019-11-26 | Disposition: A | Discharge status: Home / Self Care | Payer: Medicare Other | Attending: Emergency Medicine | Admitting: Emergency Medicine

## 2019-11-26 ENCOUNTER — Encounter (HOSPITAL_COMMUNITY): Payer: Self-pay

## 2019-11-26 DIAGNOSIS — Z7984 Long term (current) use of oral hypoglycemic drugs: Secondary | ICD-10-CM | POA: Diagnosis not present

## 2019-11-26 DIAGNOSIS — M7918 Myalgia, other site: Secondary | ICD-10-CM | POA: Diagnosis not present

## 2019-11-26 DIAGNOSIS — E1140 Type 2 diabetes mellitus with diabetic neuropathy, unspecified: Secondary | ICD-10-CM | POA: Diagnosis not present

## 2019-11-26 DIAGNOSIS — Z7982 Long term (current) use of aspirin: Secondary | ICD-10-CM | POA: Diagnosis not present

## 2019-11-26 DIAGNOSIS — Z8673 Personal history of transient ischemic attack (TIA), and cerebral infarction without residual deficits: Secondary | ICD-10-CM | POA: Diagnosis not present

## 2019-11-26 DIAGNOSIS — Z96651 Presence of right artificial knee joint: Secondary | ICD-10-CM | POA: Insufficient documentation

## 2019-11-26 DIAGNOSIS — R509 Fever, unspecified: Secondary | ICD-10-CM | POA: Diagnosis not present

## 2019-11-26 DIAGNOSIS — I10 Essential (primary) hypertension: Secondary | ICD-10-CM | POA: Diagnosis not present

## 2019-11-26 DIAGNOSIS — Z96652 Presence of left artificial knee joint: Secondary | ICD-10-CM | POA: Insufficient documentation

## 2019-11-26 DIAGNOSIS — U071 COVID-19: Secondary | ICD-10-CM | POA: Diagnosis not present

## 2019-11-26 DIAGNOSIS — Z96642 Presence of left artificial hip joint: Secondary | ICD-10-CM | POA: Insufficient documentation

## 2019-11-26 DIAGNOSIS — R079 Chest pain, unspecified: Secondary | ICD-10-CM | POA: Diagnosis not present

## 2019-11-26 DIAGNOSIS — Z79899 Other long term (current) drug therapy: Secondary | ICD-10-CM | POA: Diagnosis not present

## 2019-11-26 DIAGNOSIS — Z7901 Long term (current) use of anticoagulants: Secondary | ICD-10-CM | POA: Insufficient documentation

## 2019-11-26 DIAGNOSIS — R0789 Other chest pain: Secondary | ICD-10-CM | POA: Diagnosis not present

## 2019-11-26 DIAGNOSIS — R0602 Shortness of breath: Secondary | ICD-10-CM | POA: Diagnosis not present

## 2019-11-26 DIAGNOSIS — R531 Weakness: Secondary | ICD-10-CM | POA: Diagnosis present

## 2019-11-26 DIAGNOSIS — I499 Cardiac arrhythmia, unspecified: Secondary | ICD-10-CM | POA: Diagnosis not present

## 2019-11-26 DIAGNOSIS — I4891 Unspecified atrial fibrillation: Secondary | ICD-10-CM | POA: Diagnosis not present

## 2019-11-26 DIAGNOSIS — Z209 Contact with and (suspected) exposure to unspecified communicable disease: Secondary | ICD-10-CM | POA: Diagnosis not present

## 2019-11-26 DIAGNOSIS — E860 Dehydration: Secondary | ICD-10-CM | POA: Diagnosis not present

## 2019-11-26 DIAGNOSIS — R05 Cough: Secondary | ICD-10-CM | POA: Diagnosis not present

## 2019-11-26 LAB — COMPREHENSIVE METABOLIC PANEL
ALT: 17 U/L (ref 0–44)
AST: 22 U/L (ref 15–41)
Albumin: 3.3 g/dL — ABNORMAL LOW (ref 3.5–5.0)
Alkaline Phosphatase: 48 U/L (ref 38–126)
Anion gap: 15 (ref 5–15)
BUN: 21 mg/dL (ref 8–23)
CO2: 21 mmol/L — ABNORMAL LOW (ref 22–32)
Calcium: 8.4 mg/dL — ABNORMAL LOW (ref 8.9–10.3)
Chloride: 98 mmol/L (ref 98–111)
Creatinine, Ser: 0.98 mg/dL (ref 0.44–1.00)
GFR calc Af Amer: 59 mL/min — ABNORMAL LOW (ref >60)
GFR calc non Af Amer: 51 mL/min — ABNORMAL LOW (ref >60)
Glucose, Bld: 198 mg/dL — ABNORMAL HIGH (ref 70–99)
Potassium: 3.1 mmol/L — ABNORMAL LOW (ref 3.5–5.1)
Sodium: 134 mmol/L — ABNORMAL LOW (ref 135–145)
Total Bilirubin: 1.2 mg/dL (ref 0.3–1.2)
Total Protein: 6.2 g/dL — ABNORMAL LOW (ref 6.5–8.1)

## 2019-11-26 LAB — CBC WITH DIFFERENTIAL/PLATELET
Abs Immature Granulocytes: 0.03 10*3/uL (ref 0.00–0.07)
Basophils Absolute: 0 10*3/uL (ref 0.0–0.1)
Basophils Relative: 0 %
Eosinophils Absolute: 0 10*3/uL (ref 0.0–0.5)
Eosinophils Relative: 0 %
HCT: 41.1 % (ref 36.0–46.0)
Hemoglobin: 13.8 g/dL (ref 12.0–15.0)
Immature Granulocytes: 0 %
Lymphocytes Relative: 7 %
Lymphs Abs: 0.7 10*3/uL (ref 0.7–4.0)
MCH: 34.4 pg — ABNORMAL HIGH (ref 26.0–34.0)
MCHC: 33.6 g/dL (ref 30.0–36.0)
MCV: 102.5 fL — ABNORMAL HIGH (ref 80.0–100.0)
Monocytes Absolute: 0.7 10*3/uL (ref 0.1–1.0)
Monocytes Relative: 7 %
Neutro Abs: 8.2 10*3/uL — ABNORMAL HIGH (ref 1.7–7.7)
Neutrophils Relative %: 86 %
Platelets: 129 10*3/uL — ABNORMAL LOW (ref 150–400)
RBC: 4.01 MIL/uL (ref 3.87–5.11)
RDW: 13.7 % (ref 11.5–15.5)
WBC: 9.7 10*3/uL (ref 4.0–10.5)
nRBC: 0 % (ref 0.0–0.2)

## 2019-11-26 LAB — FIBRINOGEN: Fibrinogen: 345 mg/dL (ref 210–475)

## 2019-11-26 LAB — LACTIC ACID, PLASMA: Lactic Acid, Venous: 2.2 mmol/L (ref 0.5–1.9)

## 2019-11-26 LAB — PROCALCITONIN: Procalcitonin: <0.1 ng/mL

## 2019-11-26 LAB — C-REACTIVE PROTEIN: CRP: 2.1 mg/dL — ABNORMAL HIGH (ref <1.0)

## 2019-11-26 LAB — D-DIMER, QUANTITATIVE: D-Dimer, Quant: 1.48 ug/mL-FEU — ABNORMAL HIGH (ref 0.00–0.50)

## 2019-11-26 LAB — POC SARS CORONAVIRUS 2 AG -  ED: SARS Coronavirus 2 Ag: POSITIVE — AB

## 2019-11-26 LAB — TRIGLYCERIDES: Triglycerides: 147 mg/dL (ref <150)

## 2019-11-26 LAB — LACTATE DEHYDROGENASE: LDH: 190 U/L (ref 98–192)

## 2019-11-26 MED ORDER — BENZONATATE 100 MG PO CAPS: 100.0000 mg | ORAL_CAPSULE | Freq: Three times a day (TID) | ORAL | 0 refills | Status: AC

## 2019-11-26 MED ORDER — ONDANSETRON 4 MG PO TBDP: 4.0000 mg | ORAL_TABLET | Freq: Three times a day (TID) | ORAL | 0 refills | Status: AC | PRN

## 2019-11-26 NOTE — ED Triage Notes (Signed)
[facility] reports son and husband are covid positive with son being hospialized. Pt developed a cough on Wednesday and today became lethargic.Pt noted to have afib/rvr up to 130's RR 24. Pt 90% on RA, placed on 3 lpm with sat 97%. Temp 101.3  Given 1000 mg Tylenol

## 2019-11-26 NOTE — ED Provider Notes (Signed)
[facility] EMERGENCY DEPARTMENT Provider Note   CSN: [id number] Arrival date & time: 11/26/19  1948     History Chief Complaint  Patient presents with  . covid symtoms  . Weakness    [name] is a 86 y.o. female.  86 yo F with a cc of weakness.  Going on for about 48 hours. Cough, fever, myalgias.  No abdominal pain, vomiting or diarrhea.  Has not really been eating or drinking for the past couple days.    Per ems has had trouble getting up and moving around.  90% on room air at rest on their arrival.     The history is provided by the patient and the EMS personnel.  Weakness Severity:  Mild Onset quality:  Gradual Duration:  2 days Timing:  Constant Progression:  Worsening Chronicity:  New Relieved by:  Nothing Worsened by:  Nothing Ineffective treatments:  None tried Associated symptoms: cough and fever   Associated symptoms: no arthralgias, no chest pain, no dizziness, no dysuria, no headaches, no myalgias, no nausea, no shortness of breath, no urgency and no vomiting        Past Medical History:  Diagnosis Date  . Acute osteomyelitis ([place]) 08/25/2018   Right toe  . Anemia   . Atrial fibrillation ([place])   . Carpal tunnel syndrome, bilateral   . Cellulitis 08/25/2018   Right foot  . Cervical lymphadenopathy   . Compression fracture of fifth lumbar vertebra (HCC)    to T 11  . CVA (cerebral vascular accident) ([place]) 06/26/2019  . DDD (degenerative disc disease), lumbar   . DJD (degenerative joint disease)   . DOE (dyspnea on exertion)   . Dysphagia   . Dyspnea    with exertion  . Dysrhythmia    hx brief AF post op hip surg  . Esophageal dysmotility 01/31/2014   noted barium  . GERD (gastroesophageal reflux disease)   . Hematoma    right side after stent placement  . History of hiatal hernia 01/31/2014   Small, noted on Barium  . Hyperlipidemia   . Hypertension   . Jaundice   . Low back pain   . MRSA (methicillin resistant  Staphylococcus aureus)   . Neuropathy   . Nocturia   . OA (osteoarthritis)   . Osteoporosis   . Ovarian cyst   . PAD (peripheral artery disease) ([place])   . Peripheral vascular disease ([place])   . Pleural effusion, left 04/17/2017   Small  . RA (rheumatoid arthritis) ([place])   . Spinal stenosis   . Toe ulcer, right, with necrosis of bone ([place]) 08/25/2018  . Type 2 diabetes mellitus ([place])   . Unsteady gait   . Urinary incontinence   . Uterine polyp   . Varicose vein of leg    Bilateral    Patient Active Problem List   Diagnosis Date Noted  . Shingles (herpes zoster) polyneuropathy 10/01/2019  . Pronation deformity of right foot 10/01/2019  . Memory loss   . Seizure ([place])   . Leukocytosis   . Controlled type 2 diabetes mellitus with hyperglycemia, without long-term current use of insulin ([place])   . PAF (paroxysmal atrial fibrillation) ([place])   . 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[place]) 06/28/2019  . CVA (cerebral vascular accident) ([place]) 06/27/2019  . TIA (transient ischemic attack) 06/26/2019  . Hyperlipidemia   . Type 2 diabetes mellitus ([place])   . Degeneration of lumbar intervertebral disc 12/11/2017  . Cough   .  Hematoma   . Groin swelling 04/12/2017  . Hematoma of groin 04/12/2017  . Critical lower limb ischemia 04/08/2017  . Long term (current) use of anticoagulants [Z79.01] 12/13/2015  . Closed dislocation of tarsometatarsal joint 10/05/2015  . ULCER OF OTHER PART OF FOOT 06/06/2010  . Essential hypertension 03/26/2010  . ATRIAL FIBRILLATION 03/26/2010  . CUTANEOUS ERUPTIONS, DRUG-INDUCED 03/26/2010  . DIARRHEA 03/26/2010  . GERD 03/20/2010  . UNSPEC LOCAL INFECTION SKIN&SUBCUTANEOUS TISSUE 03/20/2010  . OSTEOARTHRITIS 03/20/2010  . ARTHRITIS, SEPTIC 03/14/2010  . OTH COMPLICATIONS DUE INTERNAL JOINT PROSTHESIS 02/22/2010    Past Surgical History:  Procedure Laterality Date  . AMPUTATION Right 02/04/2013   Procedure: [name] ;  Surgeon: Wylene Simmer,  MD;  Location: [facility];  Service: Orthopedics;  Laterality: Right;  . AMPUTATION TOE Right 09/03/2018   Procedure: AMPUTATION 3RD RIGHT TOE INTERPHALANGEAL;  Surgeon: [name], DPM;  Location: [facility];  Service: Podiatry;  Laterality: Right;  . APPENDECTOMY    . COLONOSCOPY    . EYE SURGERY     both cataracts  . FEMORAL ARTERY EXPLORATION Right 04/12/2017   Procedure: EVACUATION HEMATOMA RIGHT GROIN WITH FEMORAL ARTERY REPAIR;  Surgeon: Rosetta Posner, MD;  Location: [facility];  Service: Vascular;  Laterality: Right;  . INCISION AND DRAINAGE  2011   left hip inf-hemovac  . IR ANGIOGRAM EXTREMITY LEFT  03/25/2017  . IR ANGIOGRAM EXTREMITY LEFT  04/08/2017  . IR ANGIOGRAM SELECTIVE EACH ADDITIONAL VESSEL  03/25/2017  . IR FEM POP ART STENT INC PTA MOD SED  03/25/2017  . IR INFUSION THROMBOL ARTERIAL INITIAL (MS)  04/08/2017  . IR RADIOLOGIST EVAL & MGMT  02/26/2017  . IR RADIOLOGIST EVAL & MGMT  04/03/2017  . IR RADIOLOGIST EVAL & MGMT  08/20/2017  . IR RADIOLOGIST EVAL & MGMT  01/22/2018  . IR RADIOLOGIST EVAL & MGMT  01/13/2019  . IR THROMB F/U EVAL ART/VEN FINAL DAY (MS)  04/09/2017  . IR TIB-PERO ART PTA MOD SED  03/25/2017  . IR US GUIDE VASC ACCESS RIGHT  03/25/2017  . IR US GUIDE VASC ACCESS RIGHT  04/08/2017  . KNEE ARTHROSCOPY  1995   left  . NECK MASS EXCISION    . TONSILLECTOMY    . TOTAL HIP ARTHROPLASTY  2006   left-fx  . TOTAL KNEE ARTHROPLASTY  [id number]   left  . TOTAL KNEE ARTHROPLASTY  2009   right     OB History   No obstetric history on file.     Family History  Problem Relation Age of Onset  . Cancer Mother     Social History   Tobacco Use  . Smoking status: Never Smoker  . Smokeless tobacco: Never Used  Substance Use Topics  . Alcohol use: Not Currently  . Drug use: No    Home Medications Prior to Admission medications   Medication Sig Start Date End Date Taking? Authorizing Provider  acetaminophen (TYLENOL) 325 MG tablet Take 650 mg by  mouth daily with breakfast.     [provider]  ALPRAZolam (XANAX) 0.25 MG tablet Take 0.25-0.5 mg by mouth 3 (three) times daily as needed for anxiety.  10/19/19   [provider]  amLODipine (NORVASC) 10 MG tablet Take 1 tablet (10 mg total) by mouth daily. 07/13/19   Angiulli, Lavon Paganini, PA-C  apixaban (ELIQUIS) 2.5 MG TABS tablet Take 1 tablet (2.5 mg total) by mouth 2 (two) times daily. 07/12/19   Mendon,  Lavon Paganini, PA-C  aspirin EC 81 MG EC tablet Take 1 tablet (81 mg total) by mouth daily. 07/13/19   Angiulli, Lavon Paganini, PA-C  atorvastatin (LIPITOR) 40 MG tablet Take 1 tablet (40 mg total) by mouth daily at 6 PM. 07/12/19   Angiulli, Lavon Paganini, PA-C  benzonatate (TESSALON) 100 MG capsule Take 1 capsule (100 mg total) by mouth every 8 (eight) hours. 11/26/19   Deno Etienne, DO  calcium carbonate (OSCAL) 1500 (600 Ca) MG TABS tablet Take 600 mg of elemental calcium by mouth daily with breakfast.     [provider]  ciprofloxacin (CIPRO) 250 MG tablet Take 1 tablet (250 mg total) by mouth every 12 (twelve) hours. 10/12/19   Virgel Manifold, MD  collagenase (SANTYL) ointment Apply 1 application topically daily. 11/17/19   [name], DPM  ergocalciferol (VITAMIN D2) 1.25 MG (50000 UT) capsule Take 50,000 Units by mouth every Sunday.     [provider]  ferrous sulfate 325 (65 FE) MG tablet Take 325 mg by mouth daily with breakfast.    [provider]  gabapentin (NEURONTIN) 300 MG capsule Take 300 mg by mouth.     [provider]  gentamicin cream (GARAMYCIN) 0.1 % Apply 1 application topically 2 (two) times daily. 11/17/19   [name], DPM  lidocaine (XYLOCAINE) 2 % solution Use as directed 15 mLs in the mouth or throat every 3 (three) hours as needed for mouth pain (from shingles as needed). 10/01/19   Lovorn, Jinny Blossom, MD  lidocaine (XYLOCAINE) 5 % ointment Apply 1 application topically every 4 (four) hours as needed. To face for shingles  10/01/19   Lovorn, Jinny Blossom, MD  metFORMIN (GLUCOPHAGE) 500 MG tablet Take 1 tablet (500 mg total) by mouth 2 (two) times daily with a meal. 07/12/19   Angiulli, Lavon Paganini, PA-C  metFORMIN (GLUCOPHAGE-XR) 500 MG 24 hr tablet Take 500 mg by mouth at bedtime. 11/18/19   [provider]  metoprolol succinate (TOPROL-XL) 25 MG 24 hr tablet Take 1 tablet (25 mg total) by mouth daily with supper. 07/12/19   Angiulli, Lavon Paganini, PA-C  ondansetron (ZOFRAN ODT) 4 MG disintegrating tablet Take 1 tablet (4 mg total) by mouth every 8 (eight) hours as needed for nausea or vomiting. 11/26/19   Deno Etienne, DO  pantoprazole (PROTONIX) 40 MG tablet Take 1 tablet (40 mg total) by mouth daily. 07/12/19   Angiulli, Lavon Paganini, PA-C  vitamin B-12 (CYANOCOBALAMIN) 1000 MCG tablet Take 1,000 mcg by mouth daily with breakfast.     [provider]    Allergies    Amprenavir, Bactrim [sulfamethoxazole-trimethoprim], Ceftriaxone sodium, Phenergan [promethazine hcl], Sulfonamide derivatives, Tramadol, and Vancomycin hcl  Review of Systems   Review of Systems  Constitutional: Positive for fever. Negative for chills.  HENT: Negative for congestion and rhinorrhea.   Eyes: Negative for redness and visual disturbance.  Respiratory: Positive for cough. Negative for shortness of breath and wheezing.   Cardiovascular: Negative for chest pain and palpitations.  Gastrointestinal: Negative for nausea and vomiting.  Genitourinary: Negative for dysuria and urgency.  Musculoskeletal: Negative for arthralgias and myalgias.  Skin: Negative for pallor and wound.  Neurological: Positive for weakness. Negative for dizziness and headaches.    Physical Exam Updated Vital Signs BP (!) 168/89   Pulse (!) 58   Temp (!) 100.5 F (38.1 C) (Oral)   Resp 20   SpO2 98%   Physical Exam Vitals and nursing note reviewed.  Constitutional:  General: She is not in acute distress.    Appearance: She is cachectic. She is not  diaphoretic.     Comments: frail  HENT:     Head: Normocephalic and atraumatic.  Eyes:     Pupils: Pupils are equal, round, and reactive to light.  Cardiovascular:     Rate and Rhythm: Normal rate and regular rhythm.     Heart sounds: No murmur. No friction rub. No gallop.   Pulmonary:     Effort: Pulmonary effort is normal.     Breath sounds: No wheezing or rales.  Abdominal:     General: There is no distension.     Palpations: Abdomen is soft.     Tenderness: There is no abdominal tenderness.  Musculoskeletal:        General: No tenderness.     Cervical back: Normal range of motion and neck supple.  Skin:    General: Skin is warm and dry.  Neurological:     Mental Status: She is alert and oriented to person, place, and time.  Psychiatric:        Behavior: Behavior normal.     ED Results / Procedures / Treatments   Labs (all labs ordered are listed, but only abnormal results are displayed) Labs Reviewed  LACTIC ACID, PLASMA - Abnormal; Notable for the following components:      Result Value   Lactic Acid, Venous 2.2 (*)    All other components within normal limits  CBC WITH DIFFERENTIAL/PLATELET - Abnormal; Notable for the following components:   MCV 102.5 (*)    MCH 34.4 (*)    Platelets 129 (*)    Neutro Abs 8.2 (*)    All other components within normal limits  COMPREHENSIVE METABOLIC PANEL - Abnormal; Notable for the following components:   Sodium 134 (*)    Potassium 3.1 (*)    CO2 21 (*)    Glucose, Bld 198 (*)    Calcium 8.4 (*)    Total Protein 6.2 (*)    Albumin 3.3 (*)    GFR calc non Af Amer 51 (*)    GFR calc Af Amer 59 (*)    All other components within normal limits  C-REACTIVE PROTEIN - Abnormal; Notable for the following components:   CRP 2.1 (*)    All other components within normal limits  D-DIMER, QUANTITATIVE (NOT AT [facility]) - Abnormal; Notable for the following components:   D-Dimer, Quant 1.48 (*)    All other components within normal  limits  POC SARS CORONAVIRUS 2 AG -  ED - Abnormal; Notable for the following components:   SARS Coronavirus 2 Ag POSITIVE (*)    All other components within normal limits  CULTURE, BLOOD (ROUTINE X 2)  CULTURE, BLOOD (ROUTINE X 2)  PROCALCITONIN  LACTATE DEHYDROGENASE  TRIGLYCERIDES  FIBRINOGEN  LACTIC ACID, PLASMA  FERRITIN    EKG None  Radiology DG Chest Port 1 View  Result Date: 11/26/2019 CLINICAL DATA:  Shortness of breath, COVID-19 exposure EXAM: PORTABLE CHEST 1 VIEW COMPARISON:  04/17/2017 FINDINGS: Cardiac shadow is stable. Aortic calcifications are again seen. The lungs are well aerated bilaterally. Patchy opacities are seen primarily in the left lung base likely related to viral pneumonia. No effusion is seen. No bony abnormality is noted. IMPRESSION: Patchy airspace opacities in the left base likely related to viral pneumonia. Electronically Signed   By: Inez Catalina M.D.   On: 11/26/2019 20:25    Procedures Procedures (including critical care  time)  Medications Ordered in ED Medications - No data to display  ED Course  I have reviewed the triage vital signs and the nursing notes.  Pertinent labs & imaging results that were available during my care of the patient were reviewed by me and considered in my medical decision making (see chart for details).    MDM Rules/Calculators/A&P                      86 yo F with a cc of cough fever chills myalgias.  Going on for about 48 hours.  Not eating and drinking at home.  Sent here for increased lethargy.  Patient clinically appears unwell.  Hypertensive mildly tachycardic in A. fib.  Pleasantly tachypneic.  Febrile.  Patient lives at home with family who have all tested positive for the coronavirus.  Based on her clinical appearance I feel she is unlikely to do well with this illness.  Will order the Covid admission work-up with the anticipation that she likely will need to stay in the hospital.  Patient's lab work has  returned and is fairly consistent with having the coronavirus.  CRP is normal which makes her less likely to be bacterial.  On reassessment the patient actually looks much better than when she arrived.  She is able to ambulate with some assistance in the room without hypoxemia.  Oxygen actually stayed 97 and above the entire time.  No appreciable tachypnea or shortness of breath.  I discussed the lab work with the patient.  She would like to try and go home if that is an option.  I called and discussed this with the family.  The patient does have 24-hour nursing care at home.  There is some concern that maybe they would not continue to take care of her with the coronavirus.  Her husband has it as well.  We will put a consult into case management.  10:39 PM:  I have discussed the diagnosis/risks/treatment options with the patient and family and believe the pt to be eligible for discharge home to follow-up with PCP. We also discussed returning to the ED immediately if new or worsening sx occur. We discussed the sx which are most concerning (e.g., sudden worsening pain, fever, inability to tolerate by mouth) that necessitate immediate return. Medications administered to the patient during their visit and any new prescriptions provided to the patient are listed below.  Medications given during this visit Medications - No data to display   The patient appears reasonably screen and/or stabilized for discharge and I doubt any other medical condition or other [facility] requiring further screening, evaluation, or treatment in the ED at this time prior to discharge.   Final Clinical Impression(s) / ED Diagnoses Final diagnoses:  [id number] virus infection    Rx / DC Orders ED Discharge Orders         Ordered    ondansetron (ZOFRAN ODT) 4 MG disintegrating tablet  Every 8 hours PRN     11/26/19 2236    benzonatate (TESSALON) 100 MG capsule  Every 8 hours     11/26/19 2236           Deno Etienne, DO 11/26/19  2239

## 2019-11-26 NOTE — ED Notes (Signed)
Patient verbalizes understanding of discharge instructions. Opportunity for questioning and answers were provided. Armband removed by staff, pt discharged from ED.  

## 2019-11-26 NOTE — Discharge Instructions (Signed)
Take tylenol 2 pills 4 times a day.  Drink plenty of fluids.  Return for worsening shortness of breath, headache, confusion. Follow up with your family doctor.     

## 2019-11-26 NOTE — ED Notes (Signed)
EDP notified of 2.2 LA

## 2019-11-26 NOTE — ED Notes (Signed)
Called dtr in law Horris Latino to go over discharge instructions.

## 2019-11-26 NOTE — Care Management (Signed)
ED CM received verbal consult for Hospital [facility] home  Covid-19 program. Patient meets criteria home program CM arranged services with [facility] 336 [phone] Services to start 1/9. Updated family [name] [id number] daughter in-law and Dr Tyrone Nine also updated.

## 2019-11-27 ENCOUNTER — Telehealth: Payer: Self-pay | Admitting: Unknown Physician Specialty

## 2019-11-27 LAB — FERRITIN: Ferritin: 1125 ng/mL — ABNORMAL HIGH (ref 11–307)

## 2019-11-27 NOTE — Telephone Encounter (Signed)
  I connected by phone with [name]'s daughter in law 11/27/2019 at 10:08 AM to discuss the potential use of an new treatment for mild to moderate COVID-19 viral infection in non-hospitalized patients.  This patient is a 86 y.o. female that meets the FDA criteria for Emergency Use Authorization of bamlanivimab or casirivimab\imdevimab.  Has a (+) direct SARS-CoV-2 viral test result  Has mild or moderate COVID-19   Is ? 86 years of age and weighs ? 40 kg  Is NOT hospitalized due to COVID-19  Is NOT requiring oxygen therapy or requiring an increase in baseline oxygen flow rate due to COVID-19  Is within 10 days of symptom onset  Has at least one of the high risk factor(s) for progression to severe COVID-19 and/or hospitalization as defined in EUA.  Specific high risk criteria : >/= 84 yo with multiple c48morbid conditions   I have spoken and communicated the following to the patient or parent/caregiver:  1. FDA has authorized the emergency use of bamlanivimab and casirivimab\imdevimab for the treatment of mild to moderate COVID-19 in adults and pediatric patients with positive results of direct SARS-CoV-2 viral testing who are 44 years of age and older weighing at least 40 kg, and who are at high risk for progressing to severe COVID-19 and/or hospitalization.  2. The significant known and potential risks and benefits of bamlanivimab and casirivimab\imdevimab, and the extent to which such potential risks and benefits are unknown.  3. Information on available alternative treatments and the risks and benefits of those alternatives, including clinical trials.  4. Patients treated with bamlanivimab and casirivimab\imdevimab should continue to self-isolate and use infection control measures (e.g., wear mask, isolate, social distance, avoid sharing personal items, clean and disinfect "high touch" surfaces, and frequent handwashing) according to CDC guidelines.   5. The patient or  parent/caregiver has the option to accept or refuse bamlanivimab or casirivimab\imdevimab .  After reviewing this information with the patient, The patient has DECLINED offer to receive the infusion.Kathrine Haddock 11/27/2019 10:08 AM  sxs onset 1/7 No O2.

## 2019-11-29 ENCOUNTER — Telehealth: Payer: Self-pay | Admitting: *Deleted

## 2019-11-29 NOTE — Telephone Encounter (Signed)
[place] request number of wounds and measurements for the Santyl. I informed [facility] Pharmacist - Alana one wound to right posterior heel 0.3 x 0.3 x 0.1cm. Alana states 30gram Santyl should equal 30 days and there are 3 refills.

## 2019-11-30 ENCOUNTER — Telehealth: Payer: Self-pay | Admitting: *Deleted

## 2019-11-30 MED ORDER — VALPROIC ACID 250 MG PO CAPS: 250.0000 mg | ORAL_CAPSULE | Freq: Three times a day (TID) | ORAL | 2 refills | Status: DC

## 2019-11-30 NOTE — Telephone Encounter (Addendum)
My prior office note does address seizures with continuation of valproic acid DR 250mg  TID for seizure prophylaxis which was initiated during hospitalization. Please refill if needed. Thank you.

## 2019-11-30 NOTE — Addendum Note (Signed)
Addended by: Mal Misty on: 11/30/2019 01:59 PM   Modules accepted: Orders

## 2019-12-01 LAB — CULTURE, BLOOD (ROUTINE X 2)
Culture: NO GROWTH
Culture: NO GROWTH
Special Requests: ADEQUATE

## 2019-12-15 DIAGNOSIS — I4891 Unspecified atrial fibrillation: Secondary | ICD-10-CM | POA: Diagnosis not present

## 2019-12-16 DIAGNOSIS — E860 Dehydration: Secondary | ICD-10-CM | POA: Diagnosis not present

## 2019-12-24 DIAGNOSIS — U071 COVID-19: Secondary | ICD-10-CM | POA: Diagnosis not present

## 2019-12-24 DIAGNOSIS — B029 Zoster without complications: Secondary | ICD-10-CM | POA: Diagnosis not present

## 2019-12-24 DIAGNOSIS — Z89422 Acquired absence of other left toe(s): Secondary | ICD-10-CM | POA: Diagnosis not present

## 2019-12-24 DIAGNOSIS — E1165 Type 2 diabetes mellitus with hyperglycemia: Secondary | ICD-10-CM | POA: Diagnosis not present

## 2019-12-24 DIAGNOSIS — R569 Unspecified convulsions: Secondary | ICD-10-CM | POA: Diagnosis not present

## 2019-12-24 DIAGNOSIS — Z89421 Acquired absence of other right toe(s): Secondary | ICD-10-CM | POA: Diagnosis not present

## 2019-12-24 DIAGNOSIS — E860 Dehydration: Secondary | ICD-10-CM | POA: Diagnosis not present

## 2019-12-24 DIAGNOSIS — I10 Essential (primary) hypertension: Secondary | ICD-10-CM | POA: Diagnosis not present

## 2019-12-24 DIAGNOSIS — I700 Atherosclerosis of aorta: Secondary | ICD-10-CM | POA: Diagnosis not present

## 2019-12-24 DIAGNOSIS — Z7901 Long term (current) use of anticoagulants: Secondary | ICD-10-CM | POA: Diagnosis not present

## 2019-12-24 DIAGNOSIS — I480 Paroxysmal atrial fibrillation: Secondary | ICD-10-CM | POA: Diagnosis not present

## 2019-12-24 DIAGNOSIS — M216X1 Other acquired deformities of right foot: Secondary | ICD-10-CM | POA: Diagnosis not present

## 2019-12-24 DIAGNOSIS — Z7982 Long term (current) use of aspirin: Secondary | ICD-10-CM | POA: Diagnosis not present

## 2019-12-24 DIAGNOSIS — Z7984 Long term (current) use of oral hypoglycemic drugs: Secondary | ICD-10-CM | POA: Diagnosis not present

## 2019-12-24 DIAGNOSIS — N390 Urinary tract infection, site not specified: Secondary | ICD-10-CM | POA: Diagnosis not present

## 2019-12-24 DIAGNOSIS — R413 Other amnesia: Secondary | ICD-10-CM | POA: Diagnosis not present

## 2019-12-24 DIAGNOSIS — D72829 Elevated white blood cell count, unspecified: Secondary | ICD-10-CM | POA: Diagnosis not present

## 2019-12-24 DIAGNOSIS — Z8673 Personal history of transient ischemic attack (TIA), and cerebral infarction without residual deficits: Secondary | ICD-10-CM | POA: Diagnosis not present

## 2019-12-24 DIAGNOSIS — M6281 Muscle weakness (generalized): Secondary | ICD-10-CM | POA: Diagnosis not present

## 2019-12-30 DIAGNOSIS — I700 Atherosclerosis of aorta: Secondary | ICD-10-CM | POA: Diagnosis not present

## 2019-12-30 DIAGNOSIS — I10 Essential (primary) hypertension: Secondary | ICD-10-CM | POA: Diagnosis not present

## 2019-12-30 DIAGNOSIS — N390 Urinary tract infection, site not specified: Secondary | ICD-10-CM | POA: Diagnosis not present

## 2019-12-30 DIAGNOSIS — U071 COVID-19: Secondary | ICD-10-CM | POA: Diagnosis not present

## 2019-12-30 DIAGNOSIS — M6281 Muscle weakness (generalized): Secondary | ICD-10-CM | POA: Diagnosis not present

## 2019-12-30 DIAGNOSIS — I480 Paroxysmal atrial fibrillation: Secondary | ICD-10-CM | POA: Diagnosis not present

## 2019-12-31 DIAGNOSIS — U071 COVID-19: Secondary | ICD-10-CM | POA: Diagnosis not present

## 2019-12-31 DIAGNOSIS — I480 Paroxysmal atrial fibrillation: Secondary | ICD-10-CM | POA: Diagnosis not present

## 2019-12-31 DIAGNOSIS — I10 Essential (primary) hypertension: Secondary | ICD-10-CM | POA: Diagnosis not present

## 2019-12-31 DIAGNOSIS — M6281 Muscle weakness (generalized): Secondary | ICD-10-CM | POA: Diagnosis not present

## 2019-12-31 DIAGNOSIS — N390 Urinary tract infection, site not specified: Secondary | ICD-10-CM | POA: Diagnosis not present

## 2019-12-31 DIAGNOSIS — I700 Atherosclerosis of aorta: Secondary | ICD-10-CM | POA: Diagnosis not present

## 2020-01-03 DIAGNOSIS — I10 Essential (primary) hypertension: Secondary | ICD-10-CM | POA: Diagnosis not present

## 2020-01-03 DIAGNOSIS — I700 Atherosclerosis of aorta: Secondary | ICD-10-CM | POA: Diagnosis not present

## 2020-01-03 DIAGNOSIS — I480 Paroxysmal atrial fibrillation: Secondary | ICD-10-CM | POA: Diagnosis not present

## 2020-01-03 DIAGNOSIS — U071 COVID-19: Secondary | ICD-10-CM | POA: Diagnosis not present

## 2020-01-03 DIAGNOSIS — M6281 Muscle weakness (generalized): Secondary | ICD-10-CM | POA: Diagnosis not present

## 2020-01-03 DIAGNOSIS — N390 Urinary tract infection, site not specified: Secondary | ICD-10-CM | POA: Diagnosis not present

## 2020-01-08 DIAGNOSIS — I700 Atherosclerosis of aorta: Secondary | ICD-10-CM | POA: Diagnosis not present

## 2020-01-08 DIAGNOSIS — M6281 Muscle weakness (generalized): Secondary | ICD-10-CM | POA: Diagnosis not present

## 2020-01-08 DIAGNOSIS — N390 Urinary tract infection, site not specified: Secondary | ICD-10-CM | POA: Diagnosis not present

## 2020-01-08 DIAGNOSIS — I480 Paroxysmal atrial fibrillation: Secondary | ICD-10-CM | POA: Diagnosis not present

## 2020-01-08 DIAGNOSIS — I10 Essential (primary) hypertension: Secondary | ICD-10-CM | POA: Diagnosis not present

## 2020-01-08 DIAGNOSIS — U071 COVID-19: Secondary | ICD-10-CM | POA: Diagnosis not present

## 2020-01-10 DIAGNOSIS — I480 Paroxysmal atrial fibrillation: Secondary | ICD-10-CM | POA: Diagnosis not present

## 2020-01-10 DIAGNOSIS — N390 Urinary tract infection, site not specified: Secondary | ICD-10-CM | POA: Diagnosis not present

## 2020-01-10 DIAGNOSIS — M6281 Muscle weakness (generalized): Secondary | ICD-10-CM | POA: Diagnosis not present

## 2020-01-10 DIAGNOSIS — U071 COVID-19: Secondary | ICD-10-CM | POA: Diagnosis not present

## 2020-01-10 DIAGNOSIS — I700 Atherosclerosis of aorta: Secondary | ICD-10-CM | POA: Diagnosis not present

## 2020-01-10 DIAGNOSIS — I10 Essential (primary) hypertension: Secondary | ICD-10-CM | POA: Diagnosis not present

## 2020-01-11 DIAGNOSIS — I10 Essential (primary) hypertension: Secondary | ICD-10-CM | POA: Diagnosis not present

## 2020-01-11 DIAGNOSIS — N390 Urinary tract infection, site not specified: Secondary | ICD-10-CM | POA: Diagnosis not present

## 2020-01-11 DIAGNOSIS — M6281 Muscle weakness (generalized): Secondary | ICD-10-CM | POA: Diagnosis not present

## 2020-01-11 DIAGNOSIS — U071 COVID-19: Secondary | ICD-10-CM | POA: Diagnosis not present

## 2020-01-11 DIAGNOSIS — I480 Paroxysmal atrial fibrillation: Secondary | ICD-10-CM | POA: Diagnosis not present

## 2020-01-11 DIAGNOSIS — I700 Atherosclerosis of aorta: Secondary | ICD-10-CM | POA: Diagnosis not present

## 2020-01-13 DIAGNOSIS — I480 Paroxysmal atrial fibrillation: Secondary | ICD-10-CM | POA: Diagnosis not present

## 2020-01-13 DIAGNOSIS — M6281 Muscle weakness (generalized): Secondary | ICD-10-CM | POA: Diagnosis not present

## 2020-01-13 DIAGNOSIS — I10 Essential (primary) hypertension: Secondary | ICD-10-CM | POA: Diagnosis not present

## 2020-01-13 DIAGNOSIS — I700 Atherosclerosis of aorta: Secondary | ICD-10-CM | POA: Diagnosis not present

## 2020-01-13 DIAGNOSIS — U071 COVID-19: Secondary | ICD-10-CM | POA: Diagnosis not present

## 2020-01-13 DIAGNOSIS — N390 Urinary tract infection, site not specified: Secondary | ICD-10-CM | POA: Diagnosis not present

## 2020-01-14 DIAGNOSIS — M6281 Muscle weakness (generalized): Secondary | ICD-10-CM | POA: Diagnosis not present

## 2020-01-14 DIAGNOSIS — I10 Essential (primary) hypertension: Secondary | ICD-10-CM | POA: Diagnosis not present

## 2020-01-14 DIAGNOSIS — N390 Urinary tract infection, site not specified: Secondary | ICD-10-CM | POA: Diagnosis not present

## 2020-01-14 DIAGNOSIS — U071 COVID-19: Secondary | ICD-10-CM | POA: Diagnosis not present

## 2020-01-14 DIAGNOSIS — I480 Paroxysmal atrial fibrillation: Secondary | ICD-10-CM | POA: Diagnosis not present

## 2020-01-14 DIAGNOSIS — I700 Atherosclerosis of aorta: Secondary | ICD-10-CM | POA: Diagnosis not present

## 2020-01-18 DIAGNOSIS — I480 Paroxysmal atrial fibrillation: Secondary | ICD-10-CM | POA: Diagnosis not present

## 2020-01-18 DIAGNOSIS — N390 Urinary tract infection, site not specified: Secondary | ICD-10-CM | POA: Diagnosis not present

## 2020-01-18 DIAGNOSIS — I10 Essential (primary) hypertension: Secondary | ICD-10-CM | POA: Diagnosis not present

## 2020-01-18 DIAGNOSIS — I700 Atherosclerosis of aorta: Secondary | ICD-10-CM | POA: Diagnosis not present

## 2020-01-18 DIAGNOSIS — M6281 Muscle weakness (generalized): Secondary | ICD-10-CM | POA: Diagnosis not present

## 2020-01-18 DIAGNOSIS — U071 COVID-19: Secondary | ICD-10-CM | POA: Diagnosis not present

## 2020-01-20 DIAGNOSIS — M6281 Muscle weakness (generalized): Secondary | ICD-10-CM | POA: Diagnosis not present

## 2020-01-20 DIAGNOSIS — N390 Urinary tract infection, site not specified: Secondary | ICD-10-CM | POA: Diagnosis not present

## 2020-01-20 DIAGNOSIS — U071 COVID-19: Secondary | ICD-10-CM | POA: Diagnosis not present

## 2020-01-20 DIAGNOSIS — I700 Atherosclerosis of aorta: Secondary | ICD-10-CM | POA: Diagnosis not present

## 2020-01-20 DIAGNOSIS — I480 Paroxysmal atrial fibrillation: Secondary | ICD-10-CM | POA: Diagnosis not present

## 2020-01-20 DIAGNOSIS — I10 Essential (primary) hypertension: Secondary | ICD-10-CM | POA: Diagnosis not present

## 2020-01-21 DIAGNOSIS — M6281 Muscle weakness (generalized): Secondary | ICD-10-CM | POA: Diagnosis not present

## 2020-01-21 DIAGNOSIS — U071 COVID-19: Secondary | ICD-10-CM | POA: Diagnosis not present

## 2020-01-21 DIAGNOSIS — I480 Paroxysmal atrial fibrillation: Secondary | ICD-10-CM | POA: Diagnosis not present

## 2020-01-21 DIAGNOSIS — I700 Atherosclerosis of aorta: Secondary | ICD-10-CM | POA: Diagnosis not present

## 2020-01-21 DIAGNOSIS — I10 Essential (primary) hypertension: Secondary | ICD-10-CM | POA: Diagnosis not present

## 2020-01-21 DIAGNOSIS — N390 Urinary tract infection, site not specified: Secondary | ICD-10-CM | POA: Diagnosis not present

## 2020-01-23 DIAGNOSIS — I10 Essential (primary) hypertension: Secondary | ICD-10-CM | POA: Diagnosis not present

## 2020-01-23 DIAGNOSIS — Z8673 Personal history of transient ischemic attack (TIA), and cerebral infarction without residual deficits: Secondary | ICD-10-CM | POA: Diagnosis not present

## 2020-01-23 DIAGNOSIS — E1165 Type 2 diabetes mellitus with hyperglycemia: Secondary | ICD-10-CM | POA: Diagnosis not present

## 2020-01-23 DIAGNOSIS — R413 Other amnesia: Secondary | ICD-10-CM | POA: Diagnosis not present

## 2020-01-23 DIAGNOSIS — E860 Dehydration: Secondary | ICD-10-CM | POA: Diagnosis not present

## 2020-01-23 DIAGNOSIS — U071 COVID-19: Secondary | ICD-10-CM | POA: Diagnosis not present

## 2020-01-23 DIAGNOSIS — Z7982 Long term (current) use of aspirin: Secondary | ICD-10-CM | POA: Diagnosis not present

## 2020-01-23 DIAGNOSIS — Z89421 Acquired absence of other right toe(s): Secondary | ICD-10-CM | POA: Diagnosis not present

## 2020-01-23 DIAGNOSIS — D72829 Elevated white blood cell count, unspecified: Secondary | ICD-10-CM | POA: Diagnosis not present

## 2020-01-23 DIAGNOSIS — N390 Urinary tract infection, site not specified: Secondary | ICD-10-CM | POA: Diagnosis not present

## 2020-01-23 DIAGNOSIS — Z7901 Long term (current) use of anticoagulants: Secondary | ICD-10-CM | POA: Diagnosis not present

## 2020-01-23 DIAGNOSIS — I480 Paroxysmal atrial fibrillation: Secondary | ICD-10-CM | POA: Diagnosis not present

## 2020-01-23 DIAGNOSIS — Z89422 Acquired absence of other left toe(s): Secondary | ICD-10-CM | POA: Diagnosis not present

## 2020-01-23 DIAGNOSIS — R569 Unspecified convulsions: Secondary | ICD-10-CM | POA: Diagnosis not present

## 2020-01-23 DIAGNOSIS — M6281 Muscle weakness (generalized): Secondary | ICD-10-CM | POA: Diagnosis not present

## 2020-01-23 DIAGNOSIS — Z7984 Long term (current) use of oral hypoglycemic drugs: Secondary | ICD-10-CM | POA: Diagnosis not present

## 2020-01-23 DIAGNOSIS — B029 Zoster without complications: Secondary | ICD-10-CM | POA: Diagnosis not present

## 2020-01-23 DIAGNOSIS — M216X1 Other acquired deformities of right foot: Secondary | ICD-10-CM | POA: Diagnosis not present

## 2020-01-23 DIAGNOSIS — I700 Atherosclerosis of aorta: Secondary | ICD-10-CM | POA: Diagnosis not present

## 2020-01-24 DIAGNOSIS — N390 Urinary tract infection, site not specified: Secondary | ICD-10-CM | POA: Diagnosis not present

## 2020-01-24 DIAGNOSIS — I480 Paroxysmal atrial fibrillation: Secondary | ICD-10-CM | POA: Diagnosis not present

## 2020-01-24 DIAGNOSIS — I700 Atherosclerosis of aorta: Secondary | ICD-10-CM | POA: Diagnosis not present

## 2020-01-24 DIAGNOSIS — M6281 Muscle weakness (generalized): Secondary | ICD-10-CM | POA: Diagnosis not present

## 2020-01-24 DIAGNOSIS — U071 COVID-19: Secondary | ICD-10-CM | POA: Diagnosis not present

## 2020-01-24 DIAGNOSIS — I10 Essential (primary) hypertension: Secondary | ICD-10-CM | POA: Diagnosis not present

## 2020-01-25 DIAGNOSIS — N390 Urinary tract infection, site not specified: Secondary | ICD-10-CM | POA: Diagnosis not present

## 2020-01-25 DIAGNOSIS — U071 COVID-19: Secondary | ICD-10-CM | POA: Diagnosis not present

## 2020-01-25 DIAGNOSIS — I480 Paroxysmal atrial fibrillation: Secondary | ICD-10-CM | POA: Diagnosis not present

## 2020-01-25 DIAGNOSIS — I700 Atherosclerosis of aorta: Secondary | ICD-10-CM | POA: Diagnosis not present

## 2020-01-25 DIAGNOSIS — M6281 Muscle weakness (generalized): Secondary | ICD-10-CM | POA: Diagnosis not present

## 2020-01-25 DIAGNOSIS — I10 Essential (primary) hypertension: Secondary | ICD-10-CM | POA: Diagnosis not present

## 2020-01-27 DIAGNOSIS — U071 COVID-19: Secondary | ICD-10-CM | POA: Diagnosis not present

## 2020-01-27 DIAGNOSIS — N390 Urinary tract infection, site not specified: Secondary | ICD-10-CM | POA: Diagnosis not present

## 2020-01-27 DIAGNOSIS — I700 Atherosclerosis of aorta: Secondary | ICD-10-CM | POA: Diagnosis not present

## 2020-01-27 DIAGNOSIS — I10 Essential (primary) hypertension: Secondary | ICD-10-CM | POA: Diagnosis not present

## 2020-01-27 DIAGNOSIS — I480 Paroxysmal atrial fibrillation: Secondary | ICD-10-CM | POA: Diagnosis not present

## 2020-01-27 DIAGNOSIS — M6281 Muscle weakness (generalized): Secondary | ICD-10-CM | POA: Diagnosis not present

## 2020-01-28 DIAGNOSIS — I739 Peripheral vascular disease, unspecified: Secondary | ICD-10-CM | POA: Diagnosis not present

## 2020-01-28 DIAGNOSIS — E1151 Type 2 diabetes mellitus with diabetic peripheral angiopathy without gangrene: Secondary | ICD-10-CM | POA: Diagnosis not present

## 2020-01-28 DIAGNOSIS — I6359 Cerebral infarction due to unspecified occlusion or stenosis of other cerebral artery: Secondary | ICD-10-CM | POA: Diagnosis not present

## 2020-01-28 DIAGNOSIS — I480 Paroxysmal atrial fibrillation: Secondary | ICD-10-CM | POA: Diagnosis not present

## 2020-01-28 DIAGNOSIS — R569 Unspecified convulsions: Secondary | ICD-10-CM | POA: Diagnosis not present

## 2020-01-28 DIAGNOSIS — E46 Unspecified protein-calorie malnutrition: Secondary | ICD-10-CM | POA: Diagnosis not present

## 2020-01-28 DIAGNOSIS — Z8679 Personal history of other diseases of the circulatory system: Secondary | ICD-10-CM | POA: Diagnosis not present

## 2020-01-28 DIAGNOSIS — R197 Diarrhea, unspecified: Secondary | ICD-10-CM | POA: Diagnosis not present

## 2020-01-28 DIAGNOSIS — Z1331 Encounter for screening for depression: Secondary | ICD-10-CM | POA: Diagnosis not present

## 2020-02-01 DIAGNOSIS — M6281 Muscle weakness (generalized): Secondary | ICD-10-CM | POA: Diagnosis not present

## 2020-02-01 DIAGNOSIS — I700 Atherosclerosis of aorta: Secondary | ICD-10-CM | POA: Diagnosis not present

## 2020-02-01 DIAGNOSIS — U071 COVID-19: Secondary | ICD-10-CM | POA: Diagnosis not present

## 2020-02-01 DIAGNOSIS — N390 Urinary tract infection, site not specified: Secondary | ICD-10-CM | POA: Diagnosis not present

## 2020-02-01 DIAGNOSIS — I10 Essential (primary) hypertension: Secondary | ICD-10-CM | POA: Diagnosis not present

## 2020-02-01 DIAGNOSIS — I480 Paroxysmal atrial fibrillation: Secondary | ICD-10-CM | POA: Diagnosis not present

## 2020-02-02 DIAGNOSIS — I10 Essential (primary) hypertension: Secondary | ICD-10-CM | POA: Diagnosis not present

## 2020-02-02 DIAGNOSIS — I480 Paroxysmal atrial fibrillation: Secondary | ICD-10-CM | POA: Diagnosis not present

## 2020-02-02 DIAGNOSIS — U071 COVID-19: Secondary | ICD-10-CM | POA: Diagnosis not present

## 2020-02-02 DIAGNOSIS — I700 Atherosclerosis of aorta: Secondary | ICD-10-CM | POA: Diagnosis not present

## 2020-02-02 DIAGNOSIS — N390 Urinary tract infection, site not specified: Secondary | ICD-10-CM | POA: Diagnosis not present

## 2020-02-02 DIAGNOSIS — M6281 Muscle weakness (generalized): Secondary | ICD-10-CM | POA: Diagnosis not present

## 2020-02-03 DIAGNOSIS — N390 Urinary tract infection, site not specified: Secondary | ICD-10-CM | POA: Diagnosis not present

## 2020-02-03 DIAGNOSIS — M6281 Muscle weakness (generalized): Secondary | ICD-10-CM | POA: Diagnosis not present

## 2020-02-03 DIAGNOSIS — U071 COVID-19: Secondary | ICD-10-CM | POA: Diagnosis not present

## 2020-02-03 DIAGNOSIS — I700 Atherosclerosis of aorta: Secondary | ICD-10-CM | POA: Diagnosis not present

## 2020-02-03 DIAGNOSIS — I10 Essential (primary) hypertension: Secondary | ICD-10-CM | POA: Diagnosis not present

## 2020-02-03 DIAGNOSIS — I480 Paroxysmal atrial fibrillation: Secondary | ICD-10-CM | POA: Diagnosis not present

## 2020-02-07 DIAGNOSIS — U071 COVID-19: Secondary | ICD-10-CM | POA: Diagnosis not present

## 2020-02-07 DIAGNOSIS — M6281 Muscle weakness (generalized): Secondary | ICD-10-CM | POA: Diagnosis not present

## 2020-02-07 DIAGNOSIS — N390 Urinary tract infection, site not specified: Secondary | ICD-10-CM | POA: Diagnosis not present

## 2020-02-07 DIAGNOSIS — I480 Paroxysmal atrial fibrillation: Secondary | ICD-10-CM | POA: Diagnosis not present

## 2020-02-07 DIAGNOSIS — I700 Atherosclerosis of aorta: Secondary | ICD-10-CM | POA: Diagnosis not present

## 2020-02-07 DIAGNOSIS — I10 Essential (primary) hypertension: Secondary | ICD-10-CM | POA: Diagnosis not present

## 2020-02-10 DIAGNOSIS — M6281 Muscle weakness (generalized): Secondary | ICD-10-CM | POA: Diagnosis not present

## 2020-02-10 DIAGNOSIS — U071 COVID-19: Secondary | ICD-10-CM | POA: Diagnosis not present

## 2020-02-10 DIAGNOSIS — I10 Essential (primary) hypertension: Secondary | ICD-10-CM | POA: Diagnosis not present

## 2020-02-10 DIAGNOSIS — N390 Urinary tract infection, site not specified: Secondary | ICD-10-CM | POA: Diagnosis not present

## 2020-02-10 DIAGNOSIS — I700 Atherosclerosis of aorta: Secondary | ICD-10-CM | POA: Diagnosis not present

## 2020-02-10 DIAGNOSIS — I480 Paroxysmal atrial fibrillation: Secondary | ICD-10-CM | POA: Diagnosis not present

## 2020-02-11 DIAGNOSIS — M6281 Muscle weakness (generalized): Secondary | ICD-10-CM | POA: Diagnosis not present

## 2020-02-11 DIAGNOSIS — N390 Urinary tract infection, site not specified: Secondary | ICD-10-CM | POA: Diagnosis not present

## 2020-02-11 DIAGNOSIS — I480 Paroxysmal atrial fibrillation: Secondary | ICD-10-CM | POA: Diagnosis not present

## 2020-02-11 DIAGNOSIS — U071 COVID-19: Secondary | ICD-10-CM | POA: Diagnosis not present

## 2020-02-11 DIAGNOSIS — I10 Essential (primary) hypertension: Secondary | ICD-10-CM | POA: Diagnosis not present

## 2020-02-11 DIAGNOSIS — I700 Atherosclerosis of aorta: Secondary | ICD-10-CM | POA: Diagnosis not present

## 2020-02-15 DIAGNOSIS — M6281 Muscle weakness (generalized): Secondary | ICD-10-CM | POA: Diagnosis not present

## 2020-02-15 DIAGNOSIS — I480 Paroxysmal atrial fibrillation: Secondary | ICD-10-CM | POA: Diagnosis not present

## 2020-02-15 DIAGNOSIS — N390 Urinary tract infection, site not specified: Secondary | ICD-10-CM | POA: Diagnosis not present

## 2020-02-15 DIAGNOSIS — U071 COVID-19: Secondary | ICD-10-CM | POA: Diagnosis not present

## 2020-02-15 DIAGNOSIS — I10 Essential (primary) hypertension: Secondary | ICD-10-CM | POA: Diagnosis not present

## 2020-02-15 DIAGNOSIS — I700 Atherosclerosis of aorta: Secondary | ICD-10-CM | POA: Diagnosis not present

## 2020-02-16 ENCOUNTER — Other Ambulatory Visit: Payer: Self-pay

## 2020-02-16 ENCOUNTER — Ambulatory Visit (INDEPENDENT_AMBULATORY_CARE_PROVIDER_SITE_OTHER): Payer: Medicare Other | Admitting: Podiatry

## 2020-02-16 DIAGNOSIS — M79676 Pain in unspecified toe(s): Secondary | ICD-10-CM | POA: Diagnosis not present

## 2020-02-16 DIAGNOSIS — B351 Tinea unguium: Secondary | ICD-10-CM

## 2020-02-16 DIAGNOSIS — E0843 Diabetes mellitus due to underlying condition with diabetic autonomic (poly)neuropathy: Secondary | ICD-10-CM

## 2020-02-16 DIAGNOSIS — L97412 Non-pressure chronic ulcer of right heel and midfoot with fat layer exposed: Secondary | ICD-10-CM

## 2020-02-16 DIAGNOSIS — L97512 Non-pressure chronic ulcer of other part of right foot with fat layer exposed: Secondary | ICD-10-CM | POA: Diagnosis not present

## 2020-02-16 NOTE — Progress Notes (Signed)
HPI: 86 y.o. female presenting today for evaluation and routine care of the bilateral feet.  Patient does have a history of an ulcer noted to the right posterior heel as well as history of multiple ulcers and multiple bilateral toe amputations.  She currently has no new complaints.  She is very unstable with ambulation due to history of stroke.  She presents for further treatment evaluation  Past Medical History:  Diagnosis Date  . Acute osteomyelitis ([place]) 08/25/2018   Right toe  . Anemia   . Atrial fibrillation ([place])   . Carpal tunnel syndrome, bilateral   . Cellulitis 08/25/2018   Right foot  . Cervical lymphadenopathy   . Compression fracture of fifth lumbar vertebra ([facility])    to T 11  . CVA (cerebral vascular accident) ([place]) 06/26/2019  . DDD (degenerative disc disease), lumbar   . DJD (degenerative joint disease)   . DOE (dyspnea on exertion)   . Dysphagia   . Dyspnea    with exertion  . Dysrhythmia    hx brief AF post op hip surg  . Esophageal dysmotility 01/31/2014   noted barium  . GERD (gastroesophageal reflux disease)   . Hematoma    right side after stent placement  . History of hiatal hernia 01/31/2014   Small, noted on Barium  . Hyperlipidemia   . Hypertension   . Jaundice   . Low back pain   . MRSA (methicillin resistant Staphylococcus aureus)   . Neuropathy   . Nocturia   . OA (osteoarthritis)   . Osteoporosis   . Ovarian cyst   . PAD (peripheral artery disease) ([place])   . Peripheral vascular disease ([place])   . Pleural effusion, left 04/17/2017   Small  . RA (rheumatoid arthritis) ([place])   . Spinal stenosis   . Toe ulcer, right, with necrosis of bone ([place]) 08/25/2018  . Type 2 diabetes mellitus ([place])   . Unsteady gait   . Urinary incontinence   . Uterine polyp   . Varicose vein of leg    Bilateral     Physical Exam: General: The patient is alert and oriented x3 in no acute distress.  Dermatology: Skin is warm, dry and supple bilateral lower  extremities. Negative for open lesions or macerations.  All ulcerations as well as the right posterior heel ulcer has healed.  Complete reepithelialization has occurred.  Hyperkeratotic dystrophic nails noted 1-5 bilateral with exception of the toe amputations to the bilateral feet.  Vascular: Palpable pedal pulses bilaterally. No edema or erythema noted. Capillary refill within normal limits.  Neurological: Epicritic and protective threshold grossly intact bilaterally.   Musculoskeletal Exam: Range of motion within normal limits to all pedal and ankle joints bilateral. Muscle strength 3/5 in all groups bilateral.    Assessment: 1.  Pain due to onychomycosis of toenail bilateral 2.  History of bilateral toe amputations 3.  History of ulcers bilateral-currently healed  Plan of Care:  1. Patient evaluated.  2.  Mechanical debridement of nails 1-5 bilateral was performed using a nail nipper without incident or bleeding with exception of the toe amputation sites 3.  Return to clinic in 3 months for routine care     [name], DPM [facility]  Dr. [name], DPM    [address]  [place], [place] [address]                Office [phone]  Fax [phone]

## 2020-02-17 DIAGNOSIS — I10 Essential (primary) hypertension: Secondary | ICD-10-CM | POA: Diagnosis not present

## 2020-02-17 DIAGNOSIS — U071 COVID-19: Secondary | ICD-10-CM | POA: Diagnosis not present

## 2020-02-17 DIAGNOSIS — M6281 Muscle weakness (generalized): Secondary | ICD-10-CM | POA: Diagnosis not present

## 2020-02-17 DIAGNOSIS — I480 Paroxysmal atrial fibrillation: Secondary | ICD-10-CM | POA: Diagnosis not present

## 2020-02-17 DIAGNOSIS — I700 Atherosclerosis of aorta: Secondary | ICD-10-CM | POA: Diagnosis not present

## 2020-02-17 DIAGNOSIS — N390 Urinary tract infection, site not specified: Secondary | ICD-10-CM | POA: Diagnosis not present

## 2020-02-21 DIAGNOSIS — M6281 Muscle weakness (generalized): Secondary | ICD-10-CM | POA: Diagnosis not present

## 2020-02-21 DIAGNOSIS — U071 COVID-19: Secondary | ICD-10-CM | POA: Diagnosis not present

## 2020-02-21 DIAGNOSIS — N390 Urinary tract infection, site not specified: Secondary | ICD-10-CM | POA: Diagnosis not present

## 2020-02-21 DIAGNOSIS — I10 Essential (primary) hypertension: Secondary | ICD-10-CM | POA: Diagnosis not present

## 2020-02-21 DIAGNOSIS — I700 Atherosclerosis of aorta: Secondary | ICD-10-CM | POA: Diagnosis not present

## 2020-02-21 DIAGNOSIS — I480 Paroxysmal atrial fibrillation: Secondary | ICD-10-CM | POA: Diagnosis not present

## 2020-02-24 ENCOUNTER — Other Ambulatory Visit: Payer: Self-pay | Admitting: Adult Health

## 2020-02-29 DIAGNOSIS — Z8679 Personal history of other diseases of the circulatory system: Secondary | ICD-10-CM | POA: Diagnosis not present

## 2020-02-29 DIAGNOSIS — E1151 Type 2 diabetes mellitus with diabetic peripheral angiopathy without gangrene: Secondary | ICD-10-CM | POA: Diagnosis not present

## 2020-02-29 DIAGNOSIS — R197 Diarrhea, unspecified: Secondary | ICD-10-CM | POA: Diagnosis not present

## 2020-02-29 DIAGNOSIS — I10 Essential (primary) hypertension: Secondary | ICD-10-CM | POA: Diagnosis not present

## 2020-02-29 DIAGNOSIS — R269 Unspecified abnormalities of gait and mobility: Secondary | ICD-10-CM | POA: Diagnosis not present

## 2020-02-29 DIAGNOSIS — I480 Paroxysmal atrial fibrillation: Secondary | ICD-10-CM | POA: Diagnosis not present

## 2020-03-03 DIAGNOSIS — K219 Gastro-esophageal reflux disease without esophagitis: Secondary | ICD-10-CM | POA: Diagnosis not present

## 2020-03-03 DIAGNOSIS — E1151 Type 2 diabetes mellitus with diabetic peripheral angiopathy without gangrene: Secondary | ICD-10-CM | POA: Diagnosis not present

## 2020-03-03 DIAGNOSIS — E785 Hyperlipidemia, unspecified: Secondary | ICD-10-CM | POA: Diagnosis not present

## 2020-03-03 DIAGNOSIS — I10 Essential (primary) hypertension: Secondary | ICD-10-CM | POA: Diagnosis not present

## 2020-03-03 DIAGNOSIS — R569 Unspecified convulsions: Secondary | ICD-10-CM | POA: Diagnosis not present

## 2020-03-03 DIAGNOSIS — Z8616 Personal history of COVID-19: Secondary | ICD-10-CM | POA: Diagnosis not present

## 2020-03-03 DIAGNOSIS — E1142 Type 2 diabetes mellitus with diabetic polyneuropathy: Secondary | ICD-10-CM | POA: Diagnosis not present

## 2020-03-03 DIAGNOSIS — M069 Rheumatoid arthritis, unspecified: Secondary | ICD-10-CM | POA: Diagnosis not present

## 2020-03-03 DIAGNOSIS — I4891 Unspecified atrial fibrillation: Secondary | ICD-10-CM | POA: Diagnosis not present

## 2020-03-03 DIAGNOSIS — I69354 Hemiplegia and hemiparesis following cerebral infarction affecting left non-dominant side: Secondary | ICD-10-CM | POA: Diagnosis not present

## 2020-03-03 DIAGNOSIS — I69318 Other symptoms and signs involving cognitive functions following cerebral infarction: Secondary | ICD-10-CM | POA: Diagnosis not present

## 2020-03-03 DIAGNOSIS — R32 Unspecified urinary incontinence: Secondary | ICD-10-CM | POA: Diagnosis not present

## 2020-03-03 DIAGNOSIS — K449 Diaphragmatic hernia without obstruction or gangrene: Secondary | ICD-10-CM | POA: Diagnosis not present

## 2020-03-06 DIAGNOSIS — I69318 Other symptoms and signs involving cognitive functions following cerebral infarction: Secondary | ICD-10-CM | POA: Diagnosis not present

## 2020-03-06 DIAGNOSIS — I69354 Hemiplegia and hemiparesis following cerebral infarction affecting left non-dominant side: Secondary | ICD-10-CM | POA: Diagnosis not present

## 2020-03-06 DIAGNOSIS — E1142 Type 2 diabetes mellitus with diabetic polyneuropathy: Secondary | ICD-10-CM | POA: Diagnosis not present

## 2020-03-06 DIAGNOSIS — I10 Essential (primary) hypertension: Secondary | ICD-10-CM | POA: Diagnosis not present

## 2020-03-06 DIAGNOSIS — I4891 Unspecified atrial fibrillation: Secondary | ICD-10-CM | POA: Diagnosis not present

## 2020-03-06 DIAGNOSIS — R569 Unspecified convulsions: Secondary | ICD-10-CM | POA: Diagnosis not present

## 2020-03-07 DIAGNOSIS — I4891 Unspecified atrial fibrillation: Secondary | ICD-10-CM | POA: Diagnosis not present

## 2020-03-07 DIAGNOSIS — I69318 Other symptoms and signs involving cognitive functions following cerebral infarction: Secondary | ICD-10-CM | POA: Diagnosis not present

## 2020-03-07 DIAGNOSIS — E1142 Type 2 diabetes mellitus with diabetic polyneuropathy: Secondary | ICD-10-CM | POA: Diagnosis not present

## 2020-03-07 DIAGNOSIS — I10 Essential (primary) hypertension: Secondary | ICD-10-CM | POA: Diagnosis not present

## 2020-03-07 DIAGNOSIS — R569 Unspecified convulsions: Secondary | ICD-10-CM | POA: Diagnosis not present

## 2020-03-07 DIAGNOSIS — I69354 Hemiplegia and hemiparesis following cerebral infarction affecting left non-dominant side: Secondary | ICD-10-CM | POA: Diagnosis not present

## 2020-03-10 DIAGNOSIS — R569 Unspecified convulsions: Secondary | ICD-10-CM | POA: Diagnosis not present

## 2020-03-10 DIAGNOSIS — I4891 Unspecified atrial fibrillation: Secondary | ICD-10-CM | POA: Diagnosis not present

## 2020-03-10 DIAGNOSIS — E1142 Type 2 diabetes mellitus with diabetic polyneuropathy: Secondary | ICD-10-CM | POA: Diagnosis not present

## 2020-03-10 DIAGNOSIS — I10 Essential (primary) hypertension: Secondary | ICD-10-CM | POA: Diagnosis not present

## 2020-03-10 DIAGNOSIS — I69318 Other symptoms and signs involving cognitive functions following cerebral infarction: Secondary | ICD-10-CM | POA: Diagnosis not present

## 2020-03-10 DIAGNOSIS — I69354 Hemiplegia and hemiparesis following cerebral infarction affecting left non-dominant side: Secondary | ICD-10-CM | POA: Diagnosis not present

## 2020-03-14 DIAGNOSIS — E1142 Type 2 diabetes mellitus with diabetic polyneuropathy: Secondary | ICD-10-CM | POA: Diagnosis not present

## 2020-03-14 DIAGNOSIS — I69354 Hemiplegia and hemiparesis following cerebral infarction affecting left non-dominant side: Secondary | ICD-10-CM | POA: Diagnosis not present

## 2020-03-14 DIAGNOSIS — I10 Essential (primary) hypertension: Secondary | ICD-10-CM | POA: Diagnosis not present

## 2020-03-14 DIAGNOSIS — I69318 Other symptoms and signs involving cognitive functions following cerebral infarction: Secondary | ICD-10-CM | POA: Diagnosis not present

## 2020-03-14 DIAGNOSIS — R569 Unspecified convulsions: Secondary | ICD-10-CM | POA: Diagnosis not present

## 2020-03-14 DIAGNOSIS — I4891 Unspecified atrial fibrillation: Secondary | ICD-10-CM | POA: Diagnosis not present

## 2020-03-18 DIAGNOSIS — Z8616 Personal history of COVID-19: Secondary | ICD-10-CM | POA: Diagnosis not present

## 2020-03-18 DIAGNOSIS — I69354 Hemiplegia and hemiparesis following cerebral infarction affecting left non-dominant side: Secondary | ICD-10-CM | POA: Diagnosis not present

## 2020-03-18 DIAGNOSIS — I4891 Unspecified atrial fibrillation: Secondary | ICD-10-CM | POA: Diagnosis not present

## 2020-03-18 DIAGNOSIS — E785 Hyperlipidemia, unspecified: Secondary | ICD-10-CM | POA: Diagnosis not present

## 2020-03-18 DIAGNOSIS — I10 Essential (primary) hypertension: Secondary | ICD-10-CM | POA: Diagnosis not present

## 2020-03-18 DIAGNOSIS — R32 Unspecified urinary incontinence: Secondary | ICD-10-CM | POA: Diagnosis not present

## 2020-03-18 DIAGNOSIS — E1151 Type 2 diabetes mellitus with diabetic peripheral angiopathy without gangrene: Secondary | ICD-10-CM | POA: Diagnosis not present

## 2020-03-18 DIAGNOSIS — R569 Unspecified convulsions: Secondary | ICD-10-CM | POA: Diagnosis not present

## 2020-03-18 DIAGNOSIS — K449 Diaphragmatic hernia without obstruction or gangrene: Secondary | ICD-10-CM | POA: Diagnosis not present

## 2020-03-18 DIAGNOSIS — M069 Rheumatoid arthritis, unspecified: Secondary | ICD-10-CM | POA: Diagnosis not present

## 2020-03-18 DIAGNOSIS — E1142 Type 2 diabetes mellitus with diabetic polyneuropathy: Secondary | ICD-10-CM | POA: Diagnosis not present

## 2020-03-18 DIAGNOSIS — I69318 Other symptoms and signs involving cognitive functions following cerebral infarction: Secondary | ICD-10-CM | POA: Diagnosis not present

## 2020-03-18 DIAGNOSIS — K219 Gastro-esophageal reflux disease without esophagitis: Secondary | ICD-10-CM | POA: Diagnosis not present

## 2020-03-21 DIAGNOSIS — I69354 Hemiplegia and hemiparesis following cerebral infarction affecting left non-dominant side: Secondary | ICD-10-CM | POA: Diagnosis not present

## 2020-03-21 DIAGNOSIS — I4891 Unspecified atrial fibrillation: Secondary | ICD-10-CM | POA: Diagnosis not present

## 2020-03-21 DIAGNOSIS — I10 Essential (primary) hypertension: Secondary | ICD-10-CM | POA: Diagnosis not present

## 2020-03-21 DIAGNOSIS — R569 Unspecified convulsions: Secondary | ICD-10-CM | POA: Diagnosis not present

## 2020-03-21 DIAGNOSIS — E1142 Type 2 diabetes mellitus with diabetic polyneuropathy: Secondary | ICD-10-CM | POA: Diagnosis not present

## 2020-03-21 DIAGNOSIS — I69318 Other symptoms and signs involving cognitive functions following cerebral infarction: Secondary | ICD-10-CM | POA: Diagnosis not present

## 2020-03-24 ENCOUNTER — Other Ambulatory Visit: Payer: Self-pay | Admitting: Adult Health

## 2020-03-27 NOTE — Telephone Encounter (Signed)
I called and spoke to daughter in law, for pt, as her son is working on the farm..  Pt [place] (under hospice) as well.  Needs yrly appt for refills of depakene.  Will refill for 3 month.  I sent a email to [email or web address] to set up mychart.

## 2020-03-28 ENCOUNTER — Telehealth: Payer: Self-pay | Admitting: Physician Assistant

## 2020-03-28 DIAGNOSIS — I69318 Other symptoms and signs involving cognitive functions following cerebral infarction: Secondary | ICD-10-CM | POA: Diagnosis not present

## 2020-03-28 DIAGNOSIS — I4891 Unspecified atrial fibrillation: Secondary | ICD-10-CM | POA: Diagnosis not present

## 2020-03-28 DIAGNOSIS — R569 Unspecified convulsions: Secondary | ICD-10-CM | POA: Diagnosis not present

## 2020-03-28 DIAGNOSIS — I10 Essential (primary) hypertension: Secondary | ICD-10-CM | POA: Diagnosis not present

## 2020-03-28 DIAGNOSIS — E1142 Type 2 diabetes mellitus with diabetic polyneuropathy: Secondary | ICD-10-CM | POA: Diagnosis not present

## 2020-03-28 DIAGNOSIS — I69354 Hemiplegia and hemiparesis following cerebral infarction affecting left non-dominant side: Secondary | ICD-10-CM | POA: Diagnosis not present

## 2020-03-28 NOTE — Telephone Encounter (Signed)
Called to discuss the homebound Covid-19 vaccination initiative with the patient and/or caregiver.   Not able to leave a message. We will try back later  Angelena Form PA-C  [facility]

## 2020-03-29 DIAGNOSIS — I10 Essential (primary) hypertension: Secondary | ICD-10-CM | POA: Diagnosis not present

## 2020-03-29 DIAGNOSIS — I4891 Unspecified atrial fibrillation: Secondary | ICD-10-CM | POA: Diagnosis not present

## 2020-03-29 DIAGNOSIS — R569 Unspecified convulsions: Secondary | ICD-10-CM | POA: Diagnosis not present

## 2020-03-29 DIAGNOSIS — I69354 Hemiplegia and hemiparesis following cerebral infarction affecting left non-dominant side: Secondary | ICD-10-CM | POA: Diagnosis not present

## 2020-03-29 DIAGNOSIS — I69318 Other symptoms and signs involving cognitive functions following cerebral infarction: Secondary | ICD-10-CM | POA: Diagnosis not present

## 2020-03-29 DIAGNOSIS — E1142 Type 2 diabetes mellitus with diabetic polyneuropathy: Secondary | ICD-10-CM | POA: Diagnosis not present

## 2020-03-31 DIAGNOSIS — I69354 Hemiplegia and hemiparesis following cerebral infarction affecting left non-dominant side: Secondary | ICD-10-CM | POA: Diagnosis not present

## 2020-03-31 DIAGNOSIS — I10 Essential (primary) hypertension: Secondary | ICD-10-CM | POA: Diagnosis not present

## 2020-03-31 DIAGNOSIS — R569 Unspecified convulsions: Secondary | ICD-10-CM | POA: Diagnosis not present

## 2020-03-31 DIAGNOSIS — I4891 Unspecified atrial fibrillation: Secondary | ICD-10-CM | POA: Diagnosis not present

## 2020-03-31 DIAGNOSIS — E1142 Type 2 diabetes mellitus with diabetic polyneuropathy: Secondary | ICD-10-CM | POA: Diagnosis not present

## 2020-03-31 DIAGNOSIS — I69318 Other symptoms and signs involving cognitive functions following cerebral infarction: Secondary | ICD-10-CM | POA: Diagnosis not present

## 2020-04-01 DIAGNOSIS — E1142 Type 2 diabetes mellitus with diabetic polyneuropathy: Secondary | ICD-10-CM | POA: Diagnosis not present

## 2020-04-01 DIAGNOSIS — I4891 Unspecified atrial fibrillation: Secondary | ICD-10-CM | POA: Diagnosis not present

## 2020-04-01 DIAGNOSIS — I69318 Other symptoms and signs involving cognitive functions following cerebral infarction: Secondary | ICD-10-CM | POA: Diagnosis not present

## 2020-04-01 DIAGNOSIS — I69354 Hemiplegia and hemiparesis following cerebral infarction affecting left non-dominant side: Secondary | ICD-10-CM | POA: Diagnosis not present

## 2020-04-01 DIAGNOSIS — I10 Essential (primary) hypertension: Secondary | ICD-10-CM | POA: Diagnosis not present

## 2020-04-01 DIAGNOSIS — R569 Unspecified convulsions: Secondary | ICD-10-CM | POA: Diagnosis not present

## 2020-04-02 DIAGNOSIS — R569 Unspecified convulsions: Secondary | ICD-10-CM | POA: Diagnosis not present

## 2020-04-02 DIAGNOSIS — I4891 Unspecified atrial fibrillation: Secondary | ICD-10-CM | POA: Diagnosis not present

## 2020-04-02 DIAGNOSIS — I69354 Hemiplegia and hemiparesis following cerebral infarction affecting left non-dominant side: Secondary | ICD-10-CM | POA: Diagnosis not present

## 2020-04-02 DIAGNOSIS — I69318 Other symptoms and signs involving cognitive functions following cerebral infarction: Secondary | ICD-10-CM | POA: Diagnosis not present

## 2020-04-02 DIAGNOSIS — E1142 Type 2 diabetes mellitus with diabetic polyneuropathy: Secondary | ICD-10-CM | POA: Diagnosis not present

## 2020-04-02 DIAGNOSIS — I10 Essential (primary) hypertension: Secondary | ICD-10-CM | POA: Diagnosis not present

## 2020-04-03 DIAGNOSIS — I69354 Hemiplegia and hemiparesis following cerebral infarction affecting left non-dominant side: Secondary | ICD-10-CM | POA: Diagnosis not present

## 2020-04-03 DIAGNOSIS — I69318 Other symptoms and signs involving cognitive functions following cerebral infarction: Secondary | ICD-10-CM | POA: Diagnosis not present

## 2020-04-03 DIAGNOSIS — I10 Essential (primary) hypertension: Secondary | ICD-10-CM | POA: Diagnosis not present

## 2020-04-03 DIAGNOSIS — I4891 Unspecified atrial fibrillation: Secondary | ICD-10-CM | POA: Diagnosis not present

## 2020-04-03 DIAGNOSIS — R569 Unspecified convulsions: Secondary | ICD-10-CM | POA: Diagnosis not present

## 2020-04-03 DIAGNOSIS — E1142 Type 2 diabetes mellitus with diabetic polyneuropathy: Secondary | ICD-10-CM | POA: Diagnosis not present

## 2020-04-18 DEATH — deceased

## 2020-05-17 ENCOUNTER — Ambulatory Visit: Payer: Medicare Other | Admitting: Podiatry

## 2020-12-17 ENCOUNTER — Other Ambulatory Visit: Payer: Self-pay | Admitting: Podiatry

## 2020-12-17 DIAGNOSIS — S90822A Blister (nonthermal), left foot, initial encounter: Secondary | ICD-10-CM
